# Patient Record
Sex: Male | Born: 1945 | Race: White | Hispanic: No | Marital: Married | Smoking: Former smoker
Health system: Southern US, Community
[De-identification: ages and names within clinical notes are randomized; demographics above are authoritative.]

## PROBLEM LIST (undated history)

## (undated) DIAGNOSIS — R202 Paresthesia of skin: Secondary | ICD-10-CM

## (undated) DIAGNOSIS — K589 Irritable bowel syndrome without diarrhea: Secondary | ICD-10-CM

## (undated) DIAGNOSIS — M79604 Pain in right leg: Secondary | ICD-10-CM

## (undated) DIAGNOSIS — H9190 Unspecified hearing loss, unspecified ear: Secondary | ICD-10-CM

## (undated) DIAGNOSIS — E559 Vitamin D deficiency, unspecified: Secondary | ICD-10-CM

## (undated) DIAGNOSIS — K769 Liver disease, unspecified: Secondary | ICD-10-CM

## (undated) DIAGNOSIS — J019 Acute sinusitis, unspecified: Secondary | ICD-10-CM

## (undated) DIAGNOSIS — D461 Refractory anemia with ring sideroblasts: Secondary | ICD-10-CM

## (undated) DIAGNOSIS — K219 Gastro-esophageal reflux disease without esophagitis: Secondary | ICD-10-CM

## (undated) DIAGNOSIS — C61 Malignant neoplasm of prostate: Secondary | ICD-10-CM

## (undated) DIAGNOSIS — R0602 Shortness of breath: Secondary | ICD-10-CM

## (undated) DIAGNOSIS — R410 Disorientation, unspecified: Secondary | ICD-10-CM

## (undated) DIAGNOSIS — D72829 Elevated white blood cell count, unspecified: Secondary | ICD-10-CM

## (undated) DIAGNOSIS — R079 Chest pain, unspecified: Secondary | ICD-10-CM

## (undated) DIAGNOSIS — R1030 Lower abdominal pain, unspecified: Secondary | ICD-10-CM

## (undated) DIAGNOSIS — R0609 Other forms of dyspnea: Secondary | ICD-10-CM

## (undated) DIAGNOSIS — D469 Myelodysplastic syndrome, unspecified: Secondary | ICD-10-CM

## (undated) DIAGNOSIS — I129 Hypertensive chronic kidney disease with stage 1 through stage 4 chronic kidney disease, or unspecified chronic kidney disease: Secondary | ICD-10-CM

## (undated) DIAGNOSIS — K5792 Diverticulitis of intestine, part unspecified, without perforation or abscess without bleeding: Secondary | ICD-10-CM

## (undated) DIAGNOSIS — E785 Hyperlipidemia, unspecified: Secondary | ICD-10-CM

## (undated) DIAGNOSIS — K429 Umbilical hernia without obstruction or gangrene: Secondary | ICD-10-CM

## (undated) DIAGNOSIS — D239 Other benign neoplasm of skin, unspecified: Secondary | ICD-10-CM

## (undated) DIAGNOSIS — R059 Cough, unspecified: Secondary | ICD-10-CM

## (undated) DIAGNOSIS — R531 Weakness: Secondary | ICD-10-CM

## (undated) DIAGNOSIS — K635 Polyp of colon: Secondary | ICD-10-CM

## (undated) DIAGNOSIS — R6889 Other general symptoms and signs: Secondary | ICD-10-CM

## (undated) DIAGNOSIS — R413 Other amnesia: Principal | ICD-10-CM

## (undated) DIAGNOSIS — N433 Hydrocele, unspecified: Secondary | ICD-10-CM

## (undated) DIAGNOSIS — L03312 Cellulitis of back [any part except buttock]: Secondary | ICD-10-CM

## (undated) DIAGNOSIS — R200 Anesthesia of skin: Secondary | ICD-10-CM

## (undated) DIAGNOSIS — H6123 Impacted cerumen, bilateral: Secondary | ICD-10-CM

## (undated) DIAGNOSIS — I10 Essential (primary) hypertension: Secondary | ICD-10-CM

## (undated) DIAGNOSIS — D649 Anemia, unspecified: Secondary | ICD-10-CM

## (undated) DIAGNOSIS — N183 Chronic kidney disease, stage 3 unspecified: Secondary | ICD-10-CM

## (undated) DIAGNOSIS — N3946 Mixed incontinence: Secondary | ICD-10-CM

## (undated) DIAGNOSIS — R5383 Other fatigue: Secondary | ICD-10-CM

## (undated) DIAGNOSIS — G4700 Insomnia, unspecified: Secondary | ICD-10-CM

## (undated) DIAGNOSIS — R42 Dizziness and giddiness: Secondary | ICD-10-CM

## (undated) DIAGNOSIS — E875 Hyperkalemia: Secondary | ICD-10-CM

## (undated) DIAGNOSIS — D75839 Thrombocytosis, unspecified: Secondary | ICD-10-CM

## (undated) DIAGNOSIS — R112 Nausea with vomiting, unspecified: Secondary | ICD-10-CM

## (undated) DIAGNOSIS — N39490 Overflow incontinence: Secondary | ICD-10-CM

## (undated) DIAGNOSIS — R1011 Right upper quadrant pain: Secondary | ICD-10-CM

## (undated) DIAGNOSIS — K8001 Calculus of gallbladder with acute cholecystitis with obstruction: Secondary | ICD-10-CM

## (undated) DIAGNOSIS — K5790 Diverticulosis of intestine, part unspecified, without perforation or abscess without bleeding: Secondary | ICD-10-CM

## (undated) DIAGNOSIS — E876 Hypokalemia: Secondary | ICD-10-CM

## (undated) HISTORY — DX: Paresthesia of skin: R20.2

## (undated) HISTORY — DX: Lower abdominal pain, unspecified: R10.30

## (undated) HISTORY — DX: Diverticulosis of intestine, part unspecified, without perforation or abscess without bleeding: K57.90

## (undated) HISTORY — DX: Pain in right leg: M79.604

## (undated) HISTORY — DX: Umbilical hernia without obstruction or gangrene: K42.9

## (undated) HISTORY — DX: Hypertensive chronic kidney disease with stage 1 through stage 4 chronic kidney disease, or unspecified chronic kidney disease: I12.9

## (undated) HISTORY — DX: Anesthesia of skin: R20.2

## (undated) HISTORY — DX: Hyperkalemia: E87.5

## (undated) HISTORY — DX: Hyperlipidemia, unspecified: E78.5

## (undated) HISTORY — DX: Other amnesia: R41.3

## (undated) HISTORY — DX: Other general symptoms and signs: R68.89

## (undated) HISTORY — DX: Right upper quadrant pain: R10.11

## (undated) HISTORY — DX: Thrombocytosis, unspecified: D75.839

## (undated) HISTORY — DX: Unspecified hearing loss, unspecified ear: H91.90

## (undated) HISTORY — PX: COLONOSCOPY: SHX174

## (undated) HISTORY — DX: Hypokalemia: E87.6

## (undated) HISTORY — PX: PROSTATECTOMY: SHX69

## (undated) HISTORY — DX: Vitamin D deficiency, unspecified: E55.9

## (undated) HISTORY — DX: Gastro-esophageal reflux disease without esophagitis: K21.9

## (undated) HISTORY — DX: Nausea with vomiting, unspecified: R11.2

## (undated) HISTORY — DX: Elevated white blood cell count, unspecified: D72.829

## (undated) HISTORY — DX: Anesthesia of skin: R20.0

## (undated) HISTORY — DX: Insomnia, unspecified: G47.00

## (undated) HISTORY — DX: Mixed incontinence: N39.46

## (undated) HISTORY — DX: Overflow incontinence: N39.490

## (undated) HISTORY — PX: TONSILLECTOMY: SUR1361

## (undated) HISTORY — DX: Chest pain, unspecified: R07.9

## (undated) HISTORY — DX: Shortness of breath: R06.02

## (undated) HISTORY — DX: Dizziness and giddiness: R42

## (undated) HISTORY — DX: Calculus of gallbladder with acute cholecystitis with obstruction: K80.01

## (undated) HISTORY — DX: Other benign neoplasm of skin, unspecified: D23.9

## (undated) HISTORY — DX: Cough, unspecified: R05.9

## (undated) HISTORY — DX: Irritable bowel syndrome, unspecified: K58.9

## (undated) HISTORY — DX: Other fatigue: R53.83

## (undated) HISTORY — DX: Liver disease, unspecified: K76.9

## (undated) HISTORY — DX: Diverticulitis of intestine, part unspecified, without perforation or abscess without bleeding: K57.92

## (undated) HISTORY — DX: Impacted cerumen, bilateral: H61.23

## (undated) HISTORY — DX: Myelodysplastic syndrome, unspecified: D46.9

## (undated) HISTORY — DX: Chronic kidney disease, stage 3 unspecified: N18.30

## (undated) HISTORY — DX: Malignant neoplasm of prostate: C61

## (undated) HISTORY — DX: Other forms of dyspnea: R06.09

## (undated) HISTORY — DX: Anemia, unspecified: D64.9

## (undated) HISTORY — DX: Cellulitis of back (any part except buttock): L03.312

## (undated) HISTORY — DX: Disorientation, unspecified: R41.0

## (undated) HISTORY — DX: Hydrocele, unspecified: N43.3

## (undated) HISTORY — DX: Essential (primary) hypertension: I10

## (undated) HISTORY — DX: Weakness: R53.1

## (undated) HISTORY — DX: Polyp of colon: K63.5

## (undated) HISTORY — DX: Acute sinusitis, unspecified: J01.90

## (undated) HISTORY — DX: Refractory anemia with ring sideroblasts: D46.1

## (undated) HISTORY — PX: POLYPECTOMY: SHX149

---

## 2001-03-21 ENCOUNTER — Emergency Department (HOSPITAL_COMMUNITY): Admission: EM | Admit: 2001-03-21 | Discharge: 2001-03-21 | Payer: Self-pay | Admitting: Emergency Medicine

## 2001-05-04 ENCOUNTER — Encounter (INDEPENDENT_AMBULATORY_CARE_PROVIDER_SITE_OTHER): Payer: Self-pay | Admitting: Specialist

## 2001-05-04 ENCOUNTER — Ambulatory Visit (HOSPITAL_COMMUNITY): Admission: RE | Admit: 2001-05-04 | Discharge: 2001-05-04 | Payer: Self-pay | Admitting: *Deleted

## 2005-07-05 ENCOUNTER — Ambulatory Visit: Payer: Self-pay | Admitting: Internal Medicine

## 2006-02-27 ENCOUNTER — Ambulatory Visit: Payer: Self-pay | Admitting: Internal Medicine

## 2006-03-10 ENCOUNTER — Ambulatory Visit: Payer: Self-pay | Admitting: Internal Medicine

## 2010-05-04 ENCOUNTER — Ambulatory Visit: Payer: Self-pay | Admitting: Cardiovascular Disease

## 2010-05-13 ENCOUNTER — Ambulatory Visit: Admit: 2010-05-13 | Payer: Self-pay

## 2010-05-18 ENCOUNTER — Telehealth (INDEPENDENT_AMBULATORY_CARE_PROVIDER_SITE_OTHER): Payer: Self-pay | Admitting: *Deleted

## 2010-05-19 ENCOUNTER — Encounter: Payer: Self-pay | Admitting: Cardiology

## 2010-05-19 ENCOUNTER — Ambulatory Visit (HOSPITAL_COMMUNITY): Payer: BC Managed Care – PPO | Attending: Cardiology

## 2010-05-19 DIAGNOSIS — I4949 Other premature depolarization: Secondary | ICD-10-CM

## 2010-05-19 DIAGNOSIS — R0789 Other chest pain: Secondary | ICD-10-CM

## 2010-05-19 DIAGNOSIS — R079 Chest pain, unspecified: Secondary | ICD-10-CM | POA: Insufficient documentation

## 2010-05-19 DIAGNOSIS — R0609 Other forms of dyspnea: Secondary | ICD-10-CM

## 2010-05-26 ENCOUNTER — Encounter (HOSPITAL_COMMUNITY): Payer: Self-pay

## 2010-05-27 NOTE — Progress Notes (Signed)
Summary: nuc pre procedure  Phone Note Outgoing Call Call back at Home Phone [phone]   Call placed by: Cathlyn Parsons RN,  May 18, 2010 2:07 PM Call placed to: Patient Reason for Call: Confirm/change Appt Summary of Call: Reviewed information on [employer] Information Sheet (see scanned document for further details).  Spoke with patient.      Nuclear Med Background Indications for Stress Test: Evaluation for Ischemia   History: Echo, Myocardial Perfusion Study  History Comments: 05 MPS nl 08 Echo EF=nl;mild aortic stenosis  Symptoms: Chest Pain, Chest Tightness, Fatigue with Exertion, SOB  Symptoms Comments: CP radiates to back. R arm numbness   Nuclear Pre-Procedure Cardiac Risk Factors: Family History - CAD, History of Smoking, Lipids

## 2010-05-27 NOTE — Assessment & Plan Note (Signed)
Summary: Cardiology Nuclear Testing  Nuclear Med Background Indications for Stress Test: Evaluation for Ischemia   History: Echo, Myocardial Perfusion Study, MVP  History Comments: '05 ZOX:WRUEAV; '08 Echo:EF=55-60%, mild MR, slight MVP  Symptoms: Chest Pain, Chest Tightness, DOE, Fatigue, Palpitations  Symptoms Comments: CP radiates to back. R arm numbness. Last episode of CP:2 days ago.   Nuclear Pre-Procedure Cardiac Risk Factors: Family History - CAD, History of Smoking, Lipids Caffeine/Decaff Intake: None NPO After: 7:00 PM Lungs: Clear IV 0.9% NS with Angio Cath: 22g     IV Site: R Antecubital IV Started by: Bonnita Levan, RN Chest Size (in) 42     Height (in): 69 Weight (lb): 164 BMI: 24.31  Nuclear Med Study 1 or 2 day study:  1 day     Stress Test Type:  Stress Reading MD:  Marca Ancona, MD     Referring MD:  Kristeen Miss, MD Resting Radionuclide:  Technetium 13m Tetrofosmin     Resting Radionuclide Dose:  11.0 mCi  Stress Radionuclide:  Technetium 57m Tetrofosmin     Stress Radionuclide Dose:  33.0 mCi   Stress Protocol Exercise Time (min):  11:31 min     Max HR:  166 bpm     Predicted Max HR:  156 bpm  Max Systolic BP: 176 mm Hg     Percent Max HR:  106.41 %     METS: 13.4 Rate Pressure Product:  40981    Stress Test Technologist:  [employer], CMA-N     Nuclear Technologist:  Domenic Polite, CNMT  Rest Procedure  Myocardial perfusion imaging was performed at rest 45 minutes following the intravenous administration of Technetium 82m Tetrofosmin.  Stress Procedure  The patient exercised for 11:31 on the treadmill utilizing the Bruce protocol.  The patient stopped due to fatigue and denied any chest pain.  There were no significant ST-T wave changes, only occasional PVC's.  Technetium 66m Tetrofosmin was injected at peak exercise and myocardial perfusion imaging was performed after a brief delay.  QPS Raw Data Images:  Normal; no motion artifact; normal  heart/lung ratio. Stress Images:  Normal homogeneous uptake in all areas of the myocardium. Rest Images:  Normal homogeneous uptake in all areas of the myocardium. Subtraction (SDS):  There is no evidence of scar or ischemia. Transient Ischemic Dilatation:  0.95  (Normal <1.22)  Lung/Heart Ratio:  0.34  (Normal <0.45)  Quantitative Gated Spect Images QGS EDV:  99 ml QGS ESV:  41 ml QGS EF:  59 % QGS cine images:  Normal wall motion.    Overall Impression  Exercise Capacity: Excellent exercise capacity. BP Response: Normal blood pressure response. Clinical Symptoms: No chest pain, stopped due to fatigue.  ECG Impression: Insignificant upsloping ST segment depression. Overall Impression: Normal stress nuclear study.  Appended Document: Cardiology Nuclear Testing COPY SENT TO DR. Melburn Popper

## 2010-06-24 ENCOUNTER — Ambulatory Visit: Payer: Self-pay | Admitting: Cardiovascular Disease

## 2010-09-08 ENCOUNTER — Encounter: Payer: Self-pay | Admitting: Internal Medicine

## 2010-09-08 ENCOUNTER — Ambulatory Visit (AMBULATORY_SURGERY_CENTER): Payer: BC Managed Care – PPO | Admitting: *Deleted

## 2010-09-08 VITALS — Ht 68.0 in | Wt 161.4 lb

## 2010-09-08 DIAGNOSIS — Z1211 Encounter for screening for malignant neoplasm of colon: Secondary | ICD-10-CM

## 2010-09-08 MED ORDER — PEG-KCL-NACL-NASULF-NA ASC-C 100 G PO SOLR: ORAL | Status: DC

## 2010-09-21 ENCOUNTER — Other Ambulatory Visit: Payer: BC Managed Care – PPO | Admitting: Internal Medicine

## 2011-02-18 ENCOUNTER — Encounter: Payer: Self-pay | Admitting: Internal Medicine

## 2011-04-21 ENCOUNTER — Encounter: Payer: Self-pay | Admitting: Internal Medicine

## 2011-05-18 ENCOUNTER — Ambulatory Visit (INDEPENDENT_AMBULATORY_CARE_PROVIDER_SITE_OTHER): Payer: Medicare Other | Admitting: Internal Medicine

## 2011-05-18 ENCOUNTER — Encounter: Payer: Self-pay | Admitting: Internal Medicine

## 2011-05-18 VITALS — BP 130/78 | HR 80 | Ht 68.5 in | Wt 165.0 lb

## 2011-05-18 DIAGNOSIS — Z860100 Personal history of colon polyps, unspecified: Secondary | ICD-10-CM

## 2011-05-18 DIAGNOSIS — R1084 Generalized abdominal pain: Secondary | ICD-10-CM

## 2011-05-18 DIAGNOSIS — K219 Gastro-esophageal reflux disease without esophagitis: Secondary | ICD-10-CM

## 2011-05-18 DIAGNOSIS — K5730 Diverticulosis of large intestine without perforation or abscess without bleeding: Secondary | ICD-10-CM

## 2011-05-18 NOTE — Progress Notes (Signed)
HISTORY OF PRESENT ILLNESS:  Roy Johnson is a 66 y.o. male with a history of prostate cancer, diverticulitis, irritable bowel syndrome, colon polyps. He presents today regarding lower abdominal discomfort and surveillance colonoscopy. Index colonoscopy in 2003 elsewhere with necrotic tissue in the left colon. Colonoscopy here in November of 2007 with diminutive colon polyp removed, without pathology. Appeared adenomatous. Also severe pandiverticulosis. Followup in 5 years recommended. The patient recent saw his primary provider (encounter and outside laboratories reviewed). He was found to have mild anemia with a hemoglobin of 11.2. No evidence for iron deficiency. She does report chronic problems with lower abdominal discomfort. He describes as a gripping discomfort which can occur daily. Occasionally positional. Is not clear to him that meals or bowel movements affect the pain. Occasionally more significant flares of the deals with in time. He was treated on one occasion with antibiotics for presumed diverticulitis. He denies fevers with his discomfort. No problems today. Other GI complaints including excessive belching and occasional heartburn. His last upper endoscopy in November of 2007 was normal.  REVIEW OF SYSTEMS:  All non-GI ROS reviewed and reported to be entirely negative  Past Medical History  Diagnosis Date  . Hyperlipidemia   . Anemia   . Prostate cancer                 . Colon polyp   . Diverticulosis   . GERD (gastroesophageal reflux disease)   . Inguinal hernia   . Umbilical hernia     Past Surgical History  Procedure Date  . Prostatectomy   . Tonsillectomy     Social History Roy Johnson  reports that he quit smoking about 37 years ago. He has never used smokeless tobacco. He reports that he drinks about 3.5 ounces of alcohol per week. He reports that he does not use illicit drugs.  family history includes Breast cancer in his mother; Colon polyps in his father;  Heart disease in his father; Lung cancer in his father; Osteoarthritis in his mother; and Prostate cancer in his father.  No Known Allergies     PHYSICAL EXAMINATION: Vital signs: BP 130/78  Pulse 80  Ht 5' 8.5" (1.74 m)  Wt 165 lb (74.844 kg)  BMI 24.72 kg/m2  Constitutional: generally well-appearing, no acute distress Psychiatric: alert and oriented x3, cooperative Eyes: extraocular movements intact, anicteric, conjunctiva pink Mouth: oral pharynx moist, no lesions Neck: supple no lymphadenopathy Cardiovascular: heart regular rate and rhythm, no murmur Lungs: clear to auscultation bilaterally Abdomen: soft, nontender, nondistended, no obvious ascites, no peritoneal signs, normal bowel sounds, no organomegaly Rectal:deferred until colonoscopy Extremities: no lower extremity edema bilaterally Skin: no lesions on visible extremities Neuro: No focal deficits.  ASSESSMENT:  #1. Chronic lower abdominal discomfort consistent with irritable bowel diverticular spasm #2. History of diverticulitis #3. History of colon polyps. Due for followup #4. History of GERD. Occasional symptoms. Negative index endoscopy 2007   PLAN:  #1. Reflux precautions #2. Discussion on diverticular disease. Contact her primary provider or this office for persisting pain, as this may represent a diverticulitis flare #3. Schedule surveillance colonoscopy.The nature of the procedure, as well as the risks, benefits, and alternatives were carefully and thoroughly reviewed with the patient. Ample time for discussion and questions allowed. The patient understood, was satisfied, and agreed to proceed. The patient wishes to call back and schedule his examination next few months. It should be noted that the patient had concerns regarding his sedation with his last colonoscopy. He felt  it was very difficult to wake him after the procedure. We discussed modern sedation with propofol as an option. He is interested in  this.Marland Kitchen

## 2011-05-18 NOTE — Patient Instructions (Signed)
Please call back to schedule your Colonoscopy after checking your schedule and ask to speak with Verlon Au.  [phone]. You will also need to have a free Previsit prior to your procedure for your instructions.  Verlon Au can set that up for you when you schedule your procedure.

## 2011-05-27 ENCOUNTER — Encounter: Payer: Self-pay | Admitting: Internal Medicine

## 2011-06-03 ENCOUNTER — Other Ambulatory Visit: Payer: Self-pay | Admitting: Internal Medicine

## 2011-06-03 DIAGNOSIS — R1030 Lower abdominal pain, unspecified: Secondary | ICD-10-CM

## 2011-06-06 ENCOUNTER — Ambulatory Visit
Admission: RE | Admit: 2011-06-06 | Discharge: 2011-06-06 | Disposition: A | Discharge status: Home / Self Care | Payer: Medicare Other | Source: Ambulatory Visit | Attending: Internal Medicine | Admitting: Internal Medicine

## 2011-06-06 DIAGNOSIS — R1030 Lower abdominal pain, unspecified: Secondary | ICD-10-CM

## 2011-06-06 MED ORDER — IOHEXOL 300 MG/ML  SOLN
100.0000 mL | Freq: Once | INTRAMUSCULAR | Status: AC | PRN
  Administered 2011-06-06: 100 mL via INTRAVENOUS

## 2011-07-07 ENCOUNTER — Encounter: Payer: Self-pay | Admitting: Internal Medicine

## 2011-07-07 ENCOUNTER — Ambulatory Visit (AMBULATORY_SURGERY_CENTER): Payer: Medicare Other | Admitting: *Deleted

## 2011-07-07 VITALS — Ht 69.0 in | Wt 162.4 lb

## 2011-07-07 DIAGNOSIS — Z86010 Personal history of colonic polyps: Secondary | ICD-10-CM

## 2011-07-07 DIAGNOSIS — K5730 Diverticulosis of large intestine without perforation or abscess without bleeding: Secondary | ICD-10-CM

## 2011-07-07 DIAGNOSIS — R1084 Generalized abdominal pain: Secondary | ICD-10-CM

## 2011-07-07 MED ORDER — PEG-KCL-NACL-NASULF-NA ASC-C 100 G PO SOLR: ORAL | Status: DC

## 2011-07-20 ENCOUNTER — Encounter: Payer: Self-pay | Admitting: Internal Medicine

## 2011-07-20 ENCOUNTER — Ambulatory Visit (AMBULATORY_SURGERY_CENTER): Payer: Medicare Other | Admitting: Internal Medicine

## 2011-07-20 VITALS — BP 136/88 | HR 66 | Temp 96.6°F | Resp 14 | Ht 69.0 in | Wt 162.0 lb

## 2011-07-20 DIAGNOSIS — Z1211 Encounter for screening for malignant neoplasm of colon: Secondary | ICD-10-CM

## 2011-07-20 DIAGNOSIS — R1011 Right upper quadrant pain: Secondary | ICD-10-CM

## 2011-07-20 DIAGNOSIS — K5730 Diverticulosis of large intestine without perforation or abscess without bleeding: Secondary | ICD-10-CM

## 2011-07-20 DIAGNOSIS — Z86010 Personal history of colonic polyps: Secondary | ICD-10-CM

## 2011-07-20 MED ORDER — SODIUM CHLORIDE 0.9 % IV SOLN: 500.0000 mL | INTRAVENOUS | Status: DC

## 2011-07-20 NOTE — Op Note (Signed)
[facility] [address] [place], [place]  [address]  COLONOSCOPY PROCEDURE REPORT  PATIENT:  Roy Johnson, Roy Johnson  MR#:  [id number] BIRTHDATE:  Feb 27, 1946, 65 yrs. old  GENDER:  male ENDOSCOPIST:  Wilhemina Bonito. Eda Keys, MD REF. BY:  Surveillance Program Recall, PROCEDURE DATE:  07/20/2011 PROCEDURE:  Surveillance Colonoscopy ASA CLASS:  Class II INDICATIONS:  history of polyps, surveillance and high-risk screening ; index exam 2003 ([facility]) w/ NAP and f/u 11-07 w/ diminutive polyp (no path) and diverticulosis MEDICATIONS:   MAC sedation, administered by CRNA, propofol (Diprivan) 300 mg IV  DESCRIPTION OF PROCEDURE:   After the risks benefits and alternatives of the procedure were thoroughly explained, informed consent was obtained.  Digital rectal exam was performed and revealed no abnormalities.   The LB 180AL [id number] endoscope was introduced through the anus and advanced to the cecum, which was identified by both the appendix and ileocecal valve, without limitations.  The quality of the prep was excellent, using MoviPrep.  The instrument was then slowly withdrawn as the colon was fully examined. <<PROCEDUREIMAGES>>  FINDINGS:  Severe diverticulosis was found in the ascending , descending, and sigmoid colon.  Otherwise normal colonoscopy without  polyps, masses, vascular ectasias, or inflammatory changes.   Retroflexed views in the rectum revealed no abnormalities.    The time to cecum = 3:52  minutes. The scope was then withdrawn in  11:13  minutes from the cecum and the procedure completed.  COMPLICATIONS:  None  ENDOSCOPIC IMPRESSION: 1) Severe diverticulosis in the ascending and left colon 2) Otherwise normal colonoscopy  RECOMMENDATIONS: 1) Continue current colorectal screening recommendations with a repeat colonoscopy in 10 years.  ______________________________ Wilhemina Bonito. Eda Keys, MD  CC:  Jarome Matin, MD; The Patient  n. eSIGNED:   Wilhemina Bonito. Eda Keys at  07/20/2011 12:45 PM  [place] [facility], [id number]

## 2011-07-20 NOTE — Patient Instructions (Signed)
YOU HAD AN ENDOSCOPIC PROCEDURE TODAY AT THE [facility]: Refer to the procedure report that was given to you for any specific questions about what was found during the examination.  If the procedure report does not answer your questions, please call your gastroenterologist to clarify.  If you requested that your care partner not be given the details of your procedure findings, then the procedure report has been included in a sealed envelope for you to review at your convenience later.  YOU SHOULD EXPECT: Some feelings of bloating in the abdomen. Passage of more gas than usual.  Walking can help get rid of the air that was put into your GI tract during the procedure and reduce the bloating. If you had a lower endoscopy (such as a colonoscopy or flexible sigmoidoscopy) you may notice spotting of blood in your stool or on the toilet paper. If you underwent a bowel prep for your procedure, then you may not have a normal bowel movement for a few days.  DIET: Your first meal following the procedure should be a light meal and then it is ok to progress to your normal diet.  A half-sandwich or bowl of soup is an example of a good first meal.  Heavy or fried foods are harder to digest and may make you feel nauseous or bloated.  Likewise meals heavy in dairy and vegetables can cause extra gas to form and this can also increase the bloating.  Drink plenty of fluids but you should avoid alcoholic beverages for 24 hours.  ACTIVITY: Your care partner should take you home directly after the procedure.  You should plan to take it easy, moving slowly for the rest of the day.  You can resume normal activity the day after the procedure however you should NOT DRIVE or use heavy machinery for 24 hours (because of the sedation medicines used during the test).    SYMPTOMS TO REPORT IMMEDIATELY: A gastroenterologist can be reached at any hour.  During normal business hours, 8:30 AM to 5:00 PM Monday through Friday,  call [phone].  After hours and on weekends, please call the GI answering service at [phone] who will take a message and have the physician on call contact you.   Following lower endoscopy (colonoscopy or flexible sigmoidoscopy):  Excessive amounts of blood in the stool  Significant tenderness or worsening of abdominal pains  Swelling of the abdomen that is new, acute  Fever of 100F or higher  Following upper endoscopy (EGD)  Vomiting of blood or coffee ground material  New chest pain or pain under the shoulder blades  Painful or persistently difficult swallowing  New shortness of breath  Fever of 100F or higher  Black, tarry-looking stools  FOLLOW UP: If any biopsies were taken you will be contacted by phone or by letter within the next 1-3 weeks.  Call your gastroenterologist if you have not heard about the biopsies in 3 weeks.  Our staff will call the home number listed on your records the next business day following your procedure to check on you and address any questions or concerns that you may have at that time regarding the information given to you following your procedure. This is a courtesy call and so if there is no answer at the home number and we have not heard from you through the emergency physician on call, we will assume that you have returned to your regular daily activities without incident.  SIGNATURES/CONFIDENTIALITY: You and/or your care   partner have signed paperwork which will be entered into your electronic medical record.  These signatures attest to the fact that that the information above on your After Visit Summary has been reviewed and is understood.  Full responsibility of the confidentiality of this discharge information lies with you and/or your care-partner.  

## 2011-07-20 NOTE — Progress Notes (Signed)
Patient did not experience any of the following events: a burn prior to discharge; a fall within the facility; wrong site/side/patient/procedure/implant event; or a hospital transfer or hospital admission upon discharge from the facility. (G8907) Patient did not have preoperative order for IV antibiotic SSI prophylaxis. (G8918)  

## 2011-07-21 ENCOUNTER — Telehealth: Payer: Self-pay | Admitting: *Deleted

## 2011-07-21 NOTE — Telephone Encounter (Signed)
  Follow up Call-  Call back number 07/20/2011  Post procedure Call Back phone  # [phone]  Permission to leave phone message Yes     Patient questions:  Do you have a fever, pain , or abdominal swelling? no Pain Score  0 *  Have you tolerated food without any problems? yes  Have you been able to return to your normal activities? yes  Do you have any questions about your discharge instructions: Diet   no Medications  no Follow up visit  no  Do you have questions or concerns about your Care? no  Actions: * If pain score is 4 or above: No action needed, pain <4.

## 2012-01-19 ENCOUNTER — Encounter: Payer: Self-pay | Admitting: Cardiovascular Disease

## 2012-08-07 ENCOUNTER — Encounter: Payer: Self-pay | Admitting: Cardiovascular Disease

## 2013-01-25 ENCOUNTER — Telehealth: Payer: Self-pay | Admitting: Internal Medicine

## 2013-01-25 NOTE — Telephone Encounter (Signed)
Received call from referring office and gve np appt 11/05 @ 11 w/Dr. Arbutus Ped Referring Dr. Jarome Matin Dx- Thrombocytopenia Welcome packet mailed.

## 2013-01-25 NOTE — Telephone Encounter (Signed)
C/D 01/25/13 for appt. 02/13/13

## 2013-01-25 NOTE — Telephone Encounter (Signed)
emailed Roy Johnson to to call pt to r/s appt.

## 2013-02-01 ENCOUNTER — Telehealth: Payer: Self-pay | Admitting: Internal Medicine

## 2013-02-01 NOTE — Telephone Encounter (Signed)
Left message for patient to call back  

## 2013-02-05 NOTE — Telephone Encounter (Signed)
Left message for pt to call back  °

## 2013-02-06 ENCOUNTER — Other Ambulatory Visit: Payer: Self-pay | Admitting: *Deleted

## 2013-02-06 ENCOUNTER — Encounter: Payer: Self-pay | Admitting: Physician Assistant

## 2013-02-06 ENCOUNTER — Ambulatory Visit (INDEPENDENT_AMBULATORY_CARE_PROVIDER_SITE_OTHER): Payer: Medicare Other | Admitting: Physician Assistant

## 2013-02-06 ENCOUNTER — Telehealth: Payer: Self-pay | Admitting: Internal Medicine

## 2013-02-06 VITALS — BP 130/70 | HR 68 | Ht 67.32 in | Wt 159.1 lb

## 2013-02-06 DIAGNOSIS — K5732 Diverticulitis of large intestine without perforation or abscess without bleeding: Secondary | ICD-10-CM

## 2013-02-06 DIAGNOSIS — Z8546 Personal history of malignant neoplasm of prostate: Secondary | ICD-10-CM | POA: Insufficient documentation

## 2013-02-06 DIAGNOSIS — K5730 Diverticulosis of large intestine without perforation or abscess without bleeding: Secondary | ICD-10-CM

## 2013-02-06 DIAGNOSIS — Z86010 Personal history of colonic polyps: Secondary | ICD-10-CM

## 2013-02-06 DIAGNOSIS — Z860101 Personal history of adenomatous and serrated colon polyps: Secondary | ICD-10-CM | POA: Insufficient documentation

## 2013-02-06 DIAGNOSIS — Z8719 Personal history of other diseases of the digestive system: Secondary | ICD-10-CM

## 2013-02-06 DIAGNOSIS — K219 Gastro-esophageal reflux disease without esophagitis: Secondary | ICD-10-CM | POA: Insufficient documentation

## 2013-02-06 MED ORDER — AMOXICILLIN-POT CLAVULANATE 875-125 MG PO TABS: 1.0000 | ORAL_TABLET | Freq: Two times a day (BID) | ORAL | Status: DC

## 2013-02-06 NOTE — Patient Instructions (Addendum)
Stop the Cipro and Flagyl.  We sent a prescription for Augmentin 875 mg, take 1 tab twice daily for 10 days.  Take Align probiotc for 2-3 more weeks, coupon provided.  If feeling a lot better you can cancel the appointment for Monday 02-11-2013 at 3:15 PM.  If not then you can keep the appointment.

## 2013-02-06 NOTE — Progress Notes (Signed)
Agree with initial assessment and plans as outlined 

## 2013-02-06 NOTE — Progress Notes (Signed)
Subjective:    Patient ID: Roy Johnson, male    DOB: 04-27-1945, 67 y.o.   MRN: [id number]  HPI  Roy Johnson is a pleasant 67 year old white male known to Dr. Wendie Agreste who has history of extensive diverticular disease and previous episodes of diverticulitis. He also has history of adenomatous colon polyps IBS, prostate cancer and GERD. Patient last had colonoscopy in April of 2013 and was noted to have severe diverticulosis of the right and left colon no recurrent polyps. Patient says he has had 2 or 3 episodes of diverticulitis previously but has not had any problems for the past couple of years. He had onset of his current symptoms about 3 weeks ago with suprapubic and left lower quadrant abdominal pain. He was seen at Dr. Marja Kays office and started on a course of Cipro and Flagyl which he took for 10 days. He says his symptoms did improve however after he finished the antibiotics within about a week his symptoms had recurred. He was seen again and placed back on Cipro and Flagyl which he is now been on for about 5 days. He also was scheduled for CT scan of the abdomen and pelvis which was done at [facility], and showed extensive colonic diverticulosis with a short segment of wall thickening in the sigmoid colon associated with surrounding edema suggesting mild diverticulitis was also a moderate size left hydrocele and a small hiatal hernia. He has had an elevated white count noted on 02/01/2013 with WBC of 16.3 hemoglobin 11.4 hematocrit of 34.9 and platelet count of 11 and 37. UA was negative. Labs were repeated on 02/04/2013 showing a WBC of 15.8 hemoglobin 12 hematocrit 36.9 and platelet count of 1132. He is scheduled to see a hematologist next week. He states that he is still having lower abdominal pain despite being back on antibiotics for 5 days. He says he is not sure that the antibiotics are completely clearing his diverticulitis. Has not had any documented fever or chills but does have  occasional night sweats which he says he has had for some time. His bowel movements have been normal he has not noted any melena or hematochezia. He does feel some pain into the rectal area and testicles as well as his lower back. He has been able to eat without any change in his symptoms postprandially but has been eating  lighter. He has also noticed some nausea without vomiting. He says his energy level has been poor over the past couple of months in general.     Review of Systems  Constitutional: Positive for fatigue.  HENT: Negative.   Eyes: Negative.   Respiratory: Negative.   Cardiovascular: Negative.   Gastrointestinal: Positive for nausea and abdominal pain.  Endocrine: Negative.   Genitourinary: Positive for dysuria.  Musculoskeletal: Positive for back pain.  Allergic/Immunologic: Negative.   Neurological: Negative.   Hematological: Negative.   Psychiatric/Behavioral: Negative.    Outpatient Encounter Prescriptions as of 02/06/2013  Medication Sig Dispense Refill  . Ciprofloxacin (CIPRO PO) Take by mouth.      . MetroNIDAZOLE (FLAGYL PO) Take by mouth.      . [DISCONTINUED] peg 3350 powder (MOVIPREP) SOLR Moviprep as directed  1 kit  0   No facility-administered encounter medications on file as of 02/06/2013.      No Known Allergies Patient Active Problem List   Diagnosis Date Noted  . Diverticulosis of colon 02/06/2013  . Hx of adenomatous colonic polyps 02/06/2013  . History of IBS 02/06/2013  .  H/O prostate cancer 02/06/2013  . GERD (gastroesophageal reflux disease) 02/06/2013   History  Substance Use Topics  . Smoking status: Former Smoker    Quit date: 09/07/1973  . Smokeless tobacco: Never Used  . Alcohol Use: 3.5 oz/week    7 drink(s) per week    family history includes Breast cancer in his mother; Colon polyps in his father; Heart disease in his father; Lung cancer in his father; Osteoarthritis in his mother; Prostate cancer in his father. There is no  history of Colon cancer, Esophageal cancer, Rectal cancer, or Stomach cancer.  Objective:   Physical Exam well-developed white male in no acute distress, pleasant accompanied by his wife blood pressure 130/70 pulse 68 height 5 foot 7 weight 159. HEENT; nontraumatic normocephalic EOMI PERRLA sclera anicteric, Supple; no JVD, Cardiovascular; regular rate and rhythm with S1-S2 no murmur or gallop, Pulmonary; clear bilaterally, Abdomen; soft he is mildly tender in the suprapubic area and very low left lower quadrant there is no guarding or rebound no palpable mass or hepatosplenomegaly bowel sounds are present, Rectal; exam not done, Extremities; no clubbing cyanosis or edema skin warm and dry, Psych; mood and affect normal and appropriate        Assessment & Plan:  # 57  67 year old male with recurrent sigmoid diverticulitis on complicated by CT scan 02/01/2013 but somewhat refractory to Cipro and Flagyl with relapse after a 10 day course. #2 history of adenomatous colon polyps last colonoscopy April 2013 no recurrent polyps #3 history of prostate cancer status post prostatectomy #4 left hydrocele #5 GERD #6 thrombocytosis and leukocytosis-workup in progress- awaiting appointment with hematologist.  Plan; Will switch antibiotics to Augmentin 875 mg by mouth twice daily x10 more days Patient has just started Align and have asked him to continue for 3 weeks Patient has appointment with Dr. Marina Goodell next week-if his symptoms are much improved he may cancel if symptoms are persisting we will keep his appointment on 02/11/2013.

## 2013-02-06 NOTE — Telephone Encounter (Signed)
Pts wife states that he is on his second round of antibiotics and still having pain and not feeling well. Requesting to be seen today. Pt scheduled to see Mike Gip PA today at 2pm. Pt aware of appt.

## 2013-02-06 NOTE — Telephone Encounter (Signed)
Pt wife called requesting an sooner appt stated wife will check with md and gve her a call back [phone].

## 2013-02-11 ENCOUNTER — Ambulatory Visit: Payer: Medicare Other | Admitting: Internal Medicine

## 2013-02-12 ENCOUNTER — Other Ambulatory Visit: Payer: Self-pay | Admitting: Internal Medicine

## 2013-02-12 DIAGNOSIS — D75839 Thrombocytosis, unspecified: Secondary | ICD-10-CM

## 2013-02-12 DIAGNOSIS — D473 Essential (hemorrhagic) thrombocythemia: Secondary | ICD-10-CM

## 2013-02-12 DIAGNOSIS — D72829 Elevated white blood cell count, unspecified: Secondary | ICD-10-CM

## 2013-02-13 ENCOUNTER — Telehealth: Payer: Self-pay | Admitting: Internal Medicine

## 2013-02-13 ENCOUNTER — Other Ambulatory Visit (HOSPITAL_BASED_OUTPATIENT_CLINIC_OR_DEPARTMENT_OTHER): Payer: Medicare Other | Admitting: Lab

## 2013-02-13 ENCOUNTER — Ambulatory Visit (HOSPITAL_BASED_OUTPATIENT_CLINIC_OR_DEPARTMENT_OTHER): Payer: Medicare Other | Admitting: Internal Medicine

## 2013-02-13 ENCOUNTER — Encounter: Payer: Self-pay | Admitting: Internal Medicine

## 2013-02-13 ENCOUNTER — Ambulatory Visit: Payer: Medicare Other

## 2013-02-13 VITALS — BP 146/83 | HR 85 | Temp 98.1°F | Resp 20 | Ht 67.32 in | Wt 156.7 lb

## 2013-02-13 DIAGNOSIS — D473 Essential (hemorrhagic) thrombocythemia: Secondary | ICD-10-CM

## 2013-02-13 DIAGNOSIS — D72829 Elevated white blood cell count, unspecified: Secondary | ICD-10-CM

## 2013-02-13 LAB — IRON AND TIBC CHCC
%SAT: 11 % — ABNORMAL LOW (ref 20–55)
Iron: 25 ug/dL — ABNORMAL LOW (ref 42–163)
UIBC: 195 ug/dL (ref 117–376)

## 2013-02-13 LAB — CBC WITH DIFFERENTIAL/PLATELET
Basophils Absolute: 0.3 10*3/uL — ABNORMAL HIGH (ref 0.0–0.1)
Eosinophils Absolute: 0.3 10*3/uL (ref 0.0–0.5)
HCT: 36.7 % — ABNORMAL LOW (ref 38.4–49.9)
HGB: 11.9 g/dL — ABNORMAL LOW (ref 13.0–17.1)
LYMPH%: 11.4 % — ABNORMAL LOW (ref 14.0–49.0)
MCV: 87.7 fL (ref 79.3–98.0)
MONO#: 1.5 10*3/uL — ABNORMAL HIGH (ref 0.1–0.9)
MONO%: 7.7 % (ref 0.0–14.0)
NEUT#: 14.9 10*3/uL — ABNORMAL HIGH (ref 1.5–6.5)
Platelets: 875 10*3/uL — ABNORMAL HIGH (ref 140–400)
WBC: 19.1 10*3/uL — ABNORMAL HIGH (ref 4.0–10.3)

## 2013-02-13 LAB — COMPREHENSIVE METABOLIC PANEL (CC13)
Albumin: 3.7 g/dL (ref 3.5–5.0)
Alkaline Phosphatase: 55 U/L (ref 40–150)
Anion Gap: 10 mEq/L (ref 3–11)
BUN: 15.6 mg/dL (ref 7.0–26.0)
CO2: 23 mEq/L (ref 22–29)
Glucose: 118 mg/dl (ref 70–140)
Sodium: 140 mEq/L (ref 136–145)
Total Bilirubin: 1.03 mg/dL (ref 0.20–1.20)
Total Protein: 7.5 g/dL (ref 6.4–8.3)

## 2013-02-13 LAB — FERRITIN CHCC: Ferritin: 1252 ng/ml — ABNORMAL HIGH (ref 22–316)

## 2013-02-13 NOTE — Telephone Encounter (Signed)
Gave pt appt for lab and MD for November 2014 °

## 2013-02-13 NOTE — Patient Instructions (Signed)
Followup visit in 2 weeks for evaluation and discussion of the pending lab results. 

## 2013-02-13 NOTE — Progress Notes (Signed)
[facility] [phone]   [phone]  CONSULT NOTE  REFERRING PHYSICIAN: Dr. Ivery Quale  REASON FOR CONSULTATION:  67 years old white male with leukocytosis and thrombocytosis  HPI Roy Johnson is a 67 y.o. male was past medical history significant for multiple medical problems including history of prostate cancer status post resection 10 years ago, GERD, atypical chest pain, vitamin D deficiency, history of colon polyps and diverticulitis, anemia, and hiatal hernia. The patient has been complaining recently with frequent episodes of diverticulitis. He was seen initially by his primary care physician Dr. Jarold Motto and was treated with a course of Cipro and Flagyl with no significant improvement. He had CT scan of the abdomen that showed no significant abnormality except for diverticulitis. He was referred to Dr. Marina Goodell with [facility] gastroenterology. He was treated with another course of antibiotics in the from of Augmentin with some improvement. During his evaluation CBC was performed on 01/07/2013 and it showed elevated white blood count of 13.3 with normal hemoglobin of 12.3 and hematocrit 38.1% platelets count were elevated at 1,075,000. Repeat CBC on 01/24/2013 showed persistent elevation of the white blood count at 12.8 with decreased hemoglobin to 11.6 and hematocrit 36.5 and platelets count 1,067,000. [facility] CBC on 04/23/2012 showed normal white blood count of 9.3, low hemoglobin of 11.9, low hematocrit 35.1% and elevated platelets count of 784,000. Because of the persistent leukocytosis and thrombocytosis the patient was referred to me today for further evaluation and recommendation regarding his condition. The patient had several complaints today including persistent fatigue even before his episodes of the diverticulitis. He also has sinus headache and shortness breath with exertion even with the fact that he exercises regularly. He also complained  of indigestion and muscle pain especially at the lower extremities. He has occasional nausea and vomiting mainly from his medication. The patient denied having any significant weight loss but he has periodic night sweats. His family history significant for father who had coag disease and cancer, mother died from old age and had history of breast cancer. The patient is married and has 2 adopted children. He was accompanied by his wife Roy Johnson. He is currently retired and used to work in business. He has few years of smoking in his 71s but quit 40 years ago. He drinks a glass of fine every day and no history of drug abuse. HPI  Past Medical History  Diagnosis Date  . Hyperlipidemia   . Anemia   . Prostate cancer                 . Colon polyp   . Diverticulosis   . GERD (gastroesophageal reflux disease)   . Inguinal hernia   . Umbilical hernia   . Diverticulitis     Past Surgical History  Procedure Laterality Date  . Prostatectomy    . Tonsillectomy    . Colonoscopy    . Polypectomy      Family History  Problem Relation Age of Onset  . Heart disease Father   . Colon polyps Father   . Lung cancer Father     cancerous mole  . Prostate cancer Father   . Osteoarthritis Mother   . Breast cancer Mother   . Colon cancer Neg Hx   . Esophageal cancer Neg Hx   . Rectal cancer Neg Hx   . Stomach cancer Neg Hx     Social History History  Substance Use Topics  . Smoking status: Former Smoker  Quit date: 09/07/1973  . Smokeless tobacco: Never Used  . Alcohol Use: 3.5 oz/week    7 drink(s) per week    No Known Allergies  Current Outpatient Prescriptions  Medication Sig Dispense Refill  . amoxicillin-clavulanate (AUGMENTIN) 875-125 MG per tablet Take 1 tablet by mouth 2 (two) times daily.  20 tablet  0  . Ciprofloxacin (CIPRO PO) Take by mouth.      . MetroNIDAZOLE (FLAGYL PO) Take by mouth.       No current facility-administered medications for this visit.    Review of  Systems  Constitutional: positive for fatigue Eyes: negative Ears, nose, mouth, throat, and face: negative Respiratory: positive for dyspnea on exertion Cardiovascular: negative Gastrointestinal: positive for nausea and reflux symptoms Genitourinary:negative Integument/breast: negative Hematologic/lymphatic: negative Musculoskeletal:positive for myalgias Neurological: negative Behavioral/Psych: negative Endocrine: negative Allergic/Immunologic: negative  Physical Exam  ZOX:WRUEA, healthy, no distress, well nourished and well developed SKIN: skin color, texture, turgor are normal, no rashes or significant lesions HEAD: Normocephalic, No masses, lesions, tenderness or abnormalities EYES: normal, PERRLA EARS: External ears normal, Canals clear OROPHARYNX:no exudate, no erythema and lips, buccal mucosa, and tongue normal  NECK: supple, no adenopathy, no JVD LYMPH:  no palpable lymphadenopathy, no hepatosplenomegaly LUNGS: and palpation, clear to auscultation and percussion HEART: regular rate & rhythm and no murmurs ABDOMEN:abdomen soft, non-tender, normal bowel sounds and no masses or organomegaly BACK: Back symmetric, no curvature. EXTREMITIES:no edema, no skin discoloration  NEURO: alert & oriented x 3 with fluent speech, no focal motor/sensory deficits  PERFORMANCE STATUS: ECOG 1  LABORATORY DATA: Lab Results  Component Value Date   WBC 19.1* 02/13/2013   HGB 11.9* 02/13/2013   HCT 36.7* 02/13/2013   MCV 87.7 02/13/2013   PLT 875* 02/13/2013      Chemistry   No results found for this basename: NA, K, CL, CO2, BUN, CREATININE, GLU   No results found for this basename: CALCIUM, ALKPHOS, AST, ALT, BILITOT       RADIOGRAPHIC STUDIES: No results found.  ASSESSMENT: This is a very pleasant 67 years old white male presented for evaluation of leukocytosis and thrombocytosis questionable for myeloproliferative disorder but reactive etiology cannot be excluded especially with  his persistent diverticulitis.   PLAN: I have a lengthy discussion with the patient and his wife today about his condition. I ordered several studies for evaluation of his leukocytosis and thrombocytosis. His platelets count are low today but still over 800,000. I ordered repeat CBC, comprehensive metabolic panel, LDH, iron study and ferritin, JAK-2 mutation as well as molecular study for BCR/ABL These studies are still pending. I will arrange for the patient will come back for followup visit in 2 weeks for evaluation and discussion of his lab results The patient was advised to call immediately if he has any concerning symptoms in the interval. The patient voices understanding of current disease status and treatment options and is in agreement with the current care plan.  All questions were answered. The patient knows to call the clinic with any problems, questions or concerns. We can certainly see the patient much sooner if necessary.  Thank you so much for allowing me to participate in the care of Roy Johnson. I will continue to follow up the patient with you and assist in his care.  I spent 40 minutes counseling the patient face to face. The total time spent in the appointment was 60 minutes.  Chala Gul K. 02/13/2013, 12:46 PM

## 2013-02-14 ENCOUNTER — Telehealth: Payer: Self-pay | Admitting: Physician Assistant

## 2013-02-14 NOTE — Telephone Encounter (Signed)
Spoke with patient's wife. She states the patient is on antibiotics. He had been feeling better and cancelled his OV with Dr. Marina Goodell on Monday. On Tuesday, he started feeling the lower abdominal pain again. Denies fever. He did see the hematologist yesterday. He is asking if he needs to continue the antibiotics or come in for OV. Please, advise.(patient's wife understands he should go to ED for fever or increase in pain)

## 2013-02-15 MED ORDER — AMOXICILLIN-POT CLAVULANATE 875-125 MG PO TABS: 1.0000 | ORAL_TABLET | Freq: Two times a day (BID) | ORAL | Status: DC

## 2013-02-15 NOTE — Telephone Encounter (Signed)
Discussed with Rene Kocher- would give another 7 days of Augmentin 875 BID, and continue probitotic. ROV in one week

## 2013-02-15 NOTE — Telephone Encounter (Signed)
Spoke with Mike Gip, PA and patient needs to continue Augmentin 875 mg BID x 7 days, continue Probiotic also. Scheduled OV in one week with Mike Gip, PA - 02/22/13 at 9:00 AM.  Patient's wife aware of recommendations and appointment.

## 2013-02-16 ENCOUNTER — Other Ambulatory Visit: Payer: Self-pay | Admitting: Physician Assistant

## 2013-02-16 NOTE — Telephone Encounter (Signed)
Pt called to state Augmentin refill as discussed in this telephone note was not at the pharmacy He has continued to have abd symptoms as previously described.  No fevers.  No rectal bleeding Has followup with Amy Esterwood next week Given persistent of symptoms, now despite cipro/flagyl, augmentin course and now need for repeat augmentin course, repeat cross-sectional imaging to be considered I contacted [facility] on [address] and authorized a refill of Augmentin 875 mg BID x 10 days.

## 2013-02-17 NOTE — Telephone Encounter (Signed)
Amy, please follow up on this patient. Thanks. jp

## 2013-02-19 NOTE — Telephone Encounter (Signed)
The patient picked up the prescription on 02-16-2013 per the pharmacist at [facility].

## 2013-02-22 ENCOUNTER — Ambulatory Visit (INDEPENDENT_AMBULATORY_CARE_PROVIDER_SITE_OTHER): Payer: Medicare Other | Admitting: Physician Assistant

## 2013-02-22 ENCOUNTER — Encounter: Payer: Self-pay | Admitting: Physician Assistant

## 2013-02-22 ENCOUNTER — Other Ambulatory Visit (INDEPENDENT_AMBULATORY_CARE_PROVIDER_SITE_OTHER): Payer: Medicare Other

## 2013-02-22 VITALS — BP 124/80 | HR 66 | Ht 67.0 in | Wt 160.0 lb

## 2013-02-22 DIAGNOSIS — R300 Dysuria: Secondary | ICD-10-CM

## 2013-02-22 DIAGNOSIS — K5732 Diverticulitis of large intestine without perforation or abscess without bleeding: Secondary | ICD-10-CM

## 2013-02-22 DIAGNOSIS — R1032 Left lower quadrant pain: Secondary | ICD-10-CM

## 2013-02-22 LAB — CBC WITH DIFFERENTIAL/PLATELET
Basophils Relative: 0.8 % (ref 0.0–3.0)
Eosinophils Absolute: 0.4 10*3/uL (ref 0.0–0.7)
HCT: 35.8 % — ABNORMAL LOW (ref 39.0–52.0)
Hemoglobin: 11.3 g/dL — ABNORMAL LOW (ref 13.0–17.0)
Lymphocytes Relative: 14.9 % (ref 12.0–46.0)
Lymphs Abs: 2.1 10*3/uL (ref 0.7–4.0)
MCHC: 31.6 g/dL (ref 30.0–36.0)
Monocytes Absolute: 1 10*3/uL (ref 0.1–1.0)
Monocytes Relative: 6.7 % (ref 3.0–12.0)
Neutro Abs: 10.8 10*3/uL — ABNORMAL HIGH (ref 1.4–7.7)
RBC: 4.08 Mil/uL — ABNORMAL LOW (ref 4.22–5.81)

## 2013-02-22 LAB — URINALYSIS
Bilirubin Urine: NEGATIVE
Ketones, ur: NEGATIVE
Leukocytes, UA: NEGATIVE
Nitrite: NEGATIVE
Specific Gravity, Urine: 1.025 (ref 1.000–1.030)
Total Protein, Urine: NEGATIVE
Urine Glucose: NEGATIVE
Urobilinogen, UA: 0.2 (ref 0.0–1.0)
pH: 6 (ref 5.0–8.0)

## 2013-02-22 MED ORDER — AMOXICILLIN-POT CLAVULANATE 875-125 MG PO TABS: 1.0000 | ORAL_TABLET | Freq: Two times a day (BID) | ORAL | Status: AC

## 2013-02-22 NOTE — Patient Instructions (Signed)
Please go to the basement level to have your labs drawn.  We did call in the 4 more days of Augmentin 875/125/mg, to [address] today.   You have been scheduled for a CT scan of the abdomen and pelvis at [facility] ([address]---this is in the same building as Architectural technologist).   You are scheduled on Monday 02-25-2013 at 8:30. You should arrive at 8:15 am prior to your appointment time for registration. Please follow the written instructions below on the day of your exam:  WARNING: IF YOU ARE ALLERGIC TO IODINE/X-RAY DYE, PLEASE NOTIFY RADIOLOGY IMMEDIATELY AT [phone]! YOU WILL BE GIVEN A 13 HOUR PREMEDICATION PREP.  1) Do not eat or drink anything after 4:30 am  (4 hours prior to your test) 2) You have been given 2 bottles of oral contrast to drink. The solution may taste better if refrigerated, but do NOT add ice or any other liquid to this solution. Shake well before drinking.    Drink 1 bottle of contrast @ 6:30 am  (2 hours prior to your exam)  Drink 1 bottle of contrast @ 7:30 am  (1 hour prior to your exam)  You may take any medications as prescribed with a small amount of water except for the following: Metformin, Glucophage, Glucovance, Avandamet, Riomet, Fortamet, Actoplus Met, Janumet, Glumetza or Metaglip. The above medications must be held the day of the exam AND 48 hours after the exam.  The purpose of you drinking the oral contrast is to aid in the visualization of your intestinal tract. The contrast solution may cause some diarrhea. Before your exam is started, you will be given a small amount of fluid to drink. Depending on your individual set of symptoms, you may also receive an intravenous injection of x-ray contrast/dye. Plan on being at [facility] for 30 minutes or long, depending on the type of exam you are having performed.  If you have any questions regarding your exam or if you need to reschedule, you may call the CT  department at [phone] between the hours of 8:00 am and 5:00 pm, Monday-Friday.  ________________________________________________________________________

## 2013-02-22 NOTE — Progress Notes (Signed)
Subjective:    Patient ID: Roy Johnson, male    DOB: Aug 15, 1945, 67 y.o.   MRN: [id number]  HPI  [name]  is a very nice 67 year old male known to Dr. Marina Goodell with history of extensive diverticular disease and recurrent diverticulitis. He also has history of adenomatous colon polyps, IBS prostate cancer and GERD. Last colonoscopy was done in April of 2013 with finding of severe diverticulosis of the right and left colon no recurrent polyps. Patient was seen here on 02/06/2013 after he had been on a course of Cipro and Flagyl for 10 days per his primary care physician for an episode of recurrent diverticulitis. He says initially he improved but then within about a week his symptoms recurred he was placed back on Cipro and Flagyl and had not noticed any improvement. CT of the abdomen and pelvis [facility] October of 2014 showed extensive diverticulosis with a short segment of wall thickening in the sigmoid colon with surrounding edema suggesting mild diverticulitis was also noted a moderate left hydrocele. At that time he was switched to Augmentin 875 mg by mouth twice daily for 10 more days and asked for followup in the office. He was also continued on a probiotic. Patient has also been undergoing workup for a persistent thrombocytosis and leukocytosis and had his first appointment with Dr. Shirline Frees last week. A myeloproliferative disorder is being considered however with the diverticulitis Dr. Shirline Frees did not 12 rule out leukocytosis secondary to underlying infection. His last CBC on 02/13/2013 showed a WBC of 19.1, hemoglobin 11.9 hematocrit of 36.7 and platelet count of 875. Dr. Shirline Frees also checked a ferritin level which is elevated at 1252, severe iron of 25 TIBC 220 and iron saturation of 11. Patient comes back in today still on Augmentin and states that he really does not feel any better. He certainly does not feel any worse but continues to have tenderness and discomfort in the left  lower quadrant. He reports she's been somewhat constipated despite eating normally. He has not had any fever chills or sweats. He does feel that his pain is somewhat positional and that he can exacerbate the pain by stretching usually. He is also complaining of some pain with urination into the urethra and into the anus. He says once his bladder is empty this seems to subside.     Review of Systems  Constitutional: Positive for fatigue.  HENT: Negative.   Eyes: Negative.   Respiratory: Negative.   Cardiovascular: Negative.   Gastrointestinal: Positive for abdominal pain.  Genitourinary: Positive for dysuria and penile pain.  Musculoskeletal: Negative.   Allergic/Immunologic: Negative.   Neurological: Negative.   Hematological: Negative.   Psychiatric/Behavioral: Negative.    Outpatient Prescriptions Prior to Visit  Medication Sig Dispense Refill  . amoxicillin-clavulanate (AUGMENTIN) 875-125 MG per tablet take 1 tablet by mouth twice a day for 10 days  20 tablet  0  . Ciprofloxacin (CIPRO PO) Take by mouth.      . MetroNIDAZOLE (FLAGYL PO) Take by mouth.       No facility-administered medications prior to visit.      No Known Allergies Patient Active Problem List   Diagnosis Date Noted  . Thrombocytosis 02/13/2013  . Leukocytosis, unspecified 02/13/2013  . Diverticulosis of colon 02/06/2013  . Hx of adenomatous colonic polyps 02/06/2013  . History of IBS 02/06/2013  . H/O prostate cancer 02/06/2013  . GERD (gastroesophageal reflux disease) 02/06/2013   History  Substance Use Topics  . Smoking  status: Former Smoker    Quit date: 09/07/1973  . Smokeless tobacco: Never Used  . Alcohol Use: 3.5 oz/week    7 drink(s) per week   family history includes Breast cancer in his mother; Colon polyps in his father; Heart disease in his father; Lung cancer in his father; Osteoarthritis in his mother; Prostate cancer in his father. There is no history of Colon cancer, Esophageal  cancer, Rectal cancer, or Stomach cancer.  Objective:   Physical Exam and acute distress his wife blood pressure 124/80 pulse 66 height 5 foot 7 weight 160. HEENT; nontraumatic normocephalic EOMI PERRLA sclera anicteric, Supple; no JVD, Cardiovascular; regular rate and rhythm with S1-S2 no murmur or gallop, Pulmonary; clear bilaterally, Abdomen ;soft he is tender in the left lower quadrant and suprapubic area there is no guarding or rebound no palpable mass or hepatosplenomegaly bowel sounds are active, Rectal; exam not done, Extremities; no clubbing cyanosis or edema skin warm and dry, Psych; mood and affect normal and appropriate        Assessment & Plan:  # 57  67 year old male with known extensive diverticulosis and recurrent diverticulitis now with a prolonged episode of diverticulitis which was mild by a CT done about 3 weeks ago. Patient has persistent symptoms but despite several weeks of antibiotics and is now complaining of some urinary symptoms as well. Rule out refractory diverticulitis, rule out common current inflammatory changes of the bladder and/or adhesions contributing to  urinary symptoms  #2 persistent leukocytosis-suspect underlying myelo proliferative disorder #3 persistent thrombocytosis-suspect underlying myeloproliferative disorder workup in progress #4 mild anemia. #5 fatigue #6 history of prostate cancer status post prostatectomy #7 history of adenomatous colon polyps last colonoscopy April 2013  Plan; repeat CBC with differential today, check UA Continue Augmentin 875 twice daily x5 more days Schedule for CT scan of the abdomen and pelvis-patient may need repeat colonoscopy and/or urology consultation depending on CT findings. He will need office followup with Dr. Marina Goodell.

## 2013-02-25 ENCOUNTER — Ambulatory Visit (INDEPENDENT_AMBULATORY_CARE_PROVIDER_SITE_OTHER)
Admission: RE | Admit: 2013-02-25 | Discharge: 2013-02-25 | Disposition: A | Discharge status: Home / Self Care | Payer: Medicare Other | Source: Ambulatory Visit | Attending: Physician Assistant | Admitting: Physician Assistant

## 2013-02-25 DIAGNOSIS — K5732 Diverticulitis of large intestine without perforation or abscess without bleeding: Secondary | ICD-10-CM

## 2013-02-25 DIAGNOSIS — R300 Dysuria: Secondary | ICD-10-CM

## 2013-02-25 DIAGNOSIS — R1032 Left lower quadrant pain: Secondary | ICD-10-CM

## 2013-02-25 MED ORDER — IOHEXOL 300 MG/ML  SOLN
80.0000 mL | Freq: Once | INTRAMUSCULAR | Status: AC | PRN
  Administered 2013-02-25: 80 mL via INTRAVENOUS

## 2013-02-26 NOTE — Progress Notes (Signed)
Agree with excellent assessment and CT given urinary symptoms. Not sure colonoscopy would be helpful. Agree with follow up plans as well

## 2013-02-27 ENCOUNTER — Ambulatory Visit: Payer: Medicare Other | Admitting: Internal Medicine

## 2013-02-27 ENCOUNTER — Telehealth: Payer: Self-pay | Admitting: Physician Assistant

## 2013-02-27 NOTE — Telephone Encounter (Signed)
Pt aware of CT results per Dr. Marina Goodell. Pt scheduled per Dr. Marina Goodell to see him 03/11/13@8 :30am. Pt aware of appt.

## 2013-02-28 ENCOUNTER — Encounter: Payer: Self-pay | Admitting: Physician Assistant

## 2013-02-28 ENCOUNTER — Other Ambulatory Visit (HOSPITAL_BASED_OUTPATIENT_CLINIC_OR_DEPARTMENT_OTHER): Payer: Medicare Other | Admitting: Lab

## 2013-02-28 ENCOUNTER — Ambulatory Visit (HOSPITAL_BASED_OUTPATIENT_CLINIC_OR_DEPARTMENT_OTHER): Payer: Medicare Other | Admitting: Physician Assistant

## 2013-02-28 ENCOUNTER — Telehealth: Payer: Self-pay | Admitting: Internal Medicine

## 2013-02-28 ENCOUNTER — Other Ambulatory Visit: Payer: Medicare Other | Admitting: Lab

## 2013-02-28 VITALS — BP 137/79 | HR 63 | Temp 97.0°F | Resp 18 | Ht 67.0 in | Wt 158.9 lb

## 2013-02-28 DIAGNOSIS — D75839 Thrombocytosis, unspecified: Secondary | ICD-10-CM

## 2013-02-28 DIAGNOSIS — D72829 Elevated white blood cell count, unspecified: Secondary | ICD-10-CM

## 2013-02-28 DIAGNOSIS — D473 Essential (hemorrhagic) thrombocythemia: Secondary | ICD-10-CM

## 2013-02-28 LAB — CBC WITH DIFFERENTIAL/PLATELET
Basophils Absolute: 0.1 10*3/uL (ref 0.0–0.1)
EOS%: 3.5 % (ref 0.0–7.0)
HCT: 35.2 % — ABNORMAL LOW (ref 38.4–49.9)
HGB: 11.2 g/dL — ABNORMAL LOW (ref 13.0–17.1)
LYMPH%: 22.6 % (ref 14.0–49.0)
MCH: 27.9 pg (ref 27.2–33.4)
MCV: 87.7 fL (ref 79.3–98.0)
MONO#: 0.9 10*3/uL (ref 0.1–0.9)
MONO%: 7.4 % (ref 0.0–14.0)
NEUT%: 65.6 % (ref 39.0–75.0)
Platelets: 914 10*3/uL — ABNORMAL HIGH (ref 140–400)
RBC: 4.01 10*6/uL — ABNORMAL LOW (ref 4.20–5.82)
WBC: 11.6 10*3/uL — ABNORMAL HIGH (ref 4.0–10.3)

## 2013-02-28 MED ORDER — HYDROXYUREA 500 MG PO CAPS: 500.0000 mg | ORAL_CAPSULE | Freq: Every day | ORAL | Status: DC

## 2013-02-28 NOTE — Telephone Encounter (Signed)
Gave pt appt for labs and MD for january 2015

## 2013-02-28 NOTE — Patient Instructions (Signed)
Start taking an over-the-counter folic acid tablet once daily Begin Hydrea (hydroxyurea) 500 mg by mouth daily Keep your scheduled weekly lab appointments Follow with Dr. Arbutus Ped in one month

## 2013-02-28 NOTE — Progress Notes (Addendum)
No images are attached to the encounter. No scans are attached to the encounter. No scans are attached to the encounter. [facility] SHARED VISIT PROGRESS NOTE  Garlan Fillers, MD [address] [facility], [place]. [place] [address]  DIAGNOSIS: 1.  Essential thrombocythemia with positive JAK2 mutation 2. Leukocytosis  PRIOR THERAPY: None  CURRENT THERAPY: Patient began therapy with Hydrea 500 mg by mouth daily  INTERVAL HISTORY: Roy Johnson 67 y.o. male returns for a scheduled regular office visit for followup of his leukocytosis and thrombocytosis. He had several studies done to complete his workup including iron studies, BCR/able as well as JAK2 mutation. He presents today to discuss results of all the studies and discuss treatment options. He complains of fatigue that has been present for several months. He also reports that to his knowledge she's had a low level anemia for the past 4 years or so. He is currently completing a one-month course of Augmentin for diverticulitis. He is followed by Dr. Marina Goodell at [facility] and he has a followup appointment with Dr. Marina Goodell on 03/11/2013. He also reports a "floating headache". It will occur on various parts of his head not associated with any vision changes, nausea or vomiting. Of note patient states that he is able to do the gym 3 times a week for approximately 2 hours at each visit with some fatigue afterwards.  MEDICAL HISTORY: Past Medical History  Diagnosis Date  . Hyperlipidemia   . Anemia   . Prostate cancer                 . Colon polyp   . Diverticulosis   . GERD (gastroesophageal reflux disease)   . Inguinal hernia   . Umbilical hernia   . Diverticulitis     ALLERGIES:  has No Known Allergies.  MEDICATIONS:  Current Outpatient Prescriptions  Medication Sig Dispense Refill  . amoxicillin-clavulanate (AUGMENTIN) 500-125 MG per tablet Take 1 tablet by mouth 2 (two) times daily.       . hydroxyurea (HYDREA) 500 MG capsule Take 1 capsule (500 mg total) by mouth daily. May take with food to minimize GI side effects.  30 capsule  2   No current facility-administered medications for this visit.    SURGICAL HISTORY:  Past Surgical History  Procedure Laterality Date  . Prostatectomy    . Tonsillectomy    . Colonoscopy    . Polypectomy      REVIEW OF SYSTEMS:  Constitutional: positive for fatigue Eyes: negative Ears, nose, mouth, throat, and face: negative Respiratory: negative Cardiovascular: negative Gastrointestinal: positive for abdominal pain and Left lower quadrant pain and a patient with known diverticulitis currently completing a course of antibiotics Genitourinary:negative Integument/breast: negative Hematologic/lymphatic: negative Musculoskeletal:negative Neurological: negative Behavioral/Psych: negative Endocrine: negative Allergic/Immunologic: negative   PHYSICAL EXAMINATION: General appearance: alert, cooperative, appears stated age and no distress Head: Normocephalic, without obvious abnormality, atraumatic Neck: no adenopathy, no carotid bruit, no JVD, supple, symmetrical, trachea midline and thyroid not enlarged, symmetric, no tenderness/mass/nodules Lymph nodes: Cervical, supraclavicular, and axillary nodes normal. Resp: clear to auscultation bilaterally Back: symmetric, no curvature. ROM normal. No CVA tenderness. Cardio: regular rate and rhythm, S1, S2 normal, no murmur, click, rub or gallop GI: mild left lower quadrant tenderness without rebound or referred pain, remainder the abdomen is soft nontender no appreciable organomegaly, bowel sounds present and normally active in all quadrants Extremities: extremities normal, atraumatic, no cyanosis or edema Neurologic: Alert and oriented X 3, normal  strength and tone. Normal symmetric reflexes. Normal coordination and gait  ECOG PERFORMANCE STATUS: 1 - Symptomatic but completely  ambulatory  Blood pressure 137/79, pulse 63, temperature 97 F (36.1 C), temperature source Oral, resp. rate 18, height 5\' 7"  (1.702 m), weight 158 lb 14.4 oz (72.077 kg).  LABORATORY DATA: Lab Results  Component Value Date   WBC 11.6* 02/28/2013   HGB 11.2* 02/28/2013   HCT 35.2* 02/28/2013   MCV 87.7 02/28/2013   PLT 914* 02/28/2013      Chemistry      Component Value Date/Time   NA 140 02/13/2013 1103   K 4.2 02/13/2013 1103   CO2 23 02/13/2013 1103   BUN 15.6 02/13/2013 1103   CREATININE 0.8 02/13/2013 1103      Component Value Date/Time   CALCIUM 9.6 02/13/2013 1103   ALKPHOS 55 02/13/2013 1103   AST 15 02/13/2013 1103   ALT 13 02/13/2013 1103   BILITOT 1.03 02/13/2013 1103       RADIOGRAPHIC STUDIES:  Ct Abdomen Pelvis W Contrast  02/25/2013   CLINICAL DATA:  Left lower quadrant pain despite antibiotics. History of prostate cancer.  EXAM: CT ABDOMEN AND PELVIS WITH CONTRAST  TECHNIQUE: Multidetector CT imaging of the abdomen and pelvis was performed using the standard protocol following bolus administration of intravenous contrast.  CONTRAST:  80mL OMNIPAQUE IOHEXOL 300 MG/ML  SOLN  COMPARISON:  06/06/2011 and 07/05/2005.  CT.  FINDINGS: Lung bases clear.  Sigmoid diverticula and muscular hypertrophy. In the surrounding fat planes, curvilinear thickening suggestive of result of prior inflammation although changes of subtle acute inflammation not entirely excluded given the patient's history. No evidence of abscess or free intraperitoneal air.  One of the low-density liver lesions located within the dome of the liver has increased slightly in size now measuring 8 mm versus prior 7 mm and is too small to characterize.  Post prostatectomy. No pelvic adenopathy. Non contrast filled views of the urinary bladder unremarkable.  Heart size top-normal.  Atherosclerotic type changes lower abdominal aorta are and iliac arteries without aneurysmal dilation.  No focal splenic, pancreatic,  adrenal or renal lesion.  No calcified gallstones.  Mild degenerative changes lumbar spine.  Minimal sclerosis right ischium unchanged.  IMPRESSION: Sigmoid diverticula and muscular hypertrophy. In the surrounding fat planes, curvilinear thickening suggestive of result of prior inflammation although changes of subtle acute inflammation not entirely excluded given the patient's history. No evidence of abscess or free intraperitoneal air.  One of the low-density liver lesions located within the dome of the liver has increased slightly in size now measuring 8 mm versus prior 7 mm and is too small to characterize.  Post prostatectomy. No pelvic adenopathy.  Atherosclerotic type changes lower abdominal aorta are and iliac arteries without aneurysmal dilation.  Minimal sclerosis right ischium unchanged.  These results will be called to the ordering clinician or representative by the Radiologist Assistant, and communication documented in the PACS Dashboard.   Electronically Signed   By: Bridgett Larsson M.D.   On: 02/25/2013 09:53     ASSESSMENT: Molecular studies are as follows BCR/able was not detected therefore negative for CML. 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 Hemoglobin mildly anemic at 11.2. The patient still has a leukocytosis however the total white count is now 11.6 where previously it was 14.3  and regarding his thrombocytosis it was 1071 now 914. This is consistent with essential thrombocythemia.  PLAN: Patient was discussed with also seen by Dr. Arbutus Ped. Dr. Arbutus Ped had a long discussion with the patient and his wife regarding his laboratory molecular study results, as well as explaining his diagnosis of essential thrombocythemia. The patient will be started on Hydrea at 500 mg by mouth daily and is advised to start taking an  over-the-counter folic acid supplement tablet once daily. We will arrange for the patient to have weekly labs consisting of a CBC differential and C. met. He will followup with Dr. Arbutus Ped in one month for a symptom management visit.  Laural Benes, Fujiko Picazo E, PA-C     All questions were answered. The patient knows to call the clinic with any problems, questions or concerns. We can certainly see the patient much sooner if necessary.  ADDENDUM: Hematology/Oncology Attending: I had a face to face encounter with the patient. I recommended his current plan. This is a very pleasant 67 years old recently diagnosed with essential thrombocythemia with positive JAK-2 mutation. I have a lengthy discussion with the patient today about his condition. I recommended for him to start treatment with Hydrea 500 mg by mouth daily. I discussed with the patient adverse effect of this treatment including but not limited to mild alopecia, nausea and vomiting, myelosuppression as well as liver or renal dysfunction. The patient would like to proceed with treatment as planned. He would come back for follow up visit in one month's for reevaluation and management any adverse effect of his treatment. He was advised to call immediately if he has any concerning symptoms in the interval. Lajuana Matte., MD 03/02/2013

## 2013-03-08 ENCOUNTER — Other Ambulatory Visit (HOSPITAL_BASED_OUTPATIENT_CLINIC_OR_DEPARTMENT_OTHER): Payer: Medicare Other | Admitting: Lab

## 2013-03-08 DIAGNOSIS — D473 Essential (hemorrhagic) thrombocythemia: Secondary | ICD-10-CM

## 2013-03-08 LAB — CBC WITH DIFFERENTIAL/PLATELET
BASO%: 2.6 % — ABNORMAL HIGH (ref 0.0–2.0)
Basophils Absolute: 0.3 10*3/uL — ABNORMAL HIGH (ref 0.0–0.1)
Eosinophils Absolute: 0.3 10*3/uL (ref 0.0–0.5)
HGB: 11 g/dL — ABNORMAL LOW (ref 13.0–17.1)
LYMPH%: 25.9 % (ref 14.0–49.0)
MCH: 28.5 pg (ref 27.2–33.4)
MCHC: 32.1 g/dL (ref 32.0–36.0)
MCV: 88.7 fL (ref 79.3–98.0)
MONO#: 0.8 10*3/uL (ref 0.1–0.9)
MONO%: 7.7 % (ref 0.0–14.0)
NEUT%: 60.8 % (ref 39.0–75.0)
Platelets: 945 10*3/uL — ABNORMAL HIGH (ref 140–400)
RBC: 3.87 10*6/uL — ABNORMAL LOW (ref 4.20–5.82)
WBC: 10.8 10*3/uL — ABNORMAL HIGH (ref 4.0–10.3)

## 2013-03-08 LAB — COMPREHENSIVE METABOLIC PANEL (CC13)
Alkaline Phosphatase: 63 U/L (ref 40–150)
Creatinine: 0.9 mg/dL (ref 0.7–1.3)
Glucose: 138 mg/dl (ref 70–140)
Sodium: 139 mEq/L (ref 136–145)
Total Bilirubin: 0.7 mg/dL (ref 0.20–1.20)
Total Protein: 7.3 g/dL (ref 6.4–8.3)

## 2013-03-11 ENCOUNTER — Encounter: Payer: Self-pay | Admitting: Internal Medicine

## 2013-03-11 ENCOUNTER — Ambulatory Visit (INDEPENDENT_AMBULATORY_CARE_PROVIDER_SITE_OTHER): Payer: Medicare Other | Admitting: Internal Medicine

## 2013-03-11 VITALS — BP 120/70 | HR 75 | Ht 67.25 in | Wt 157.0 lb

## 2013-03-11 DIAGNOSIS — K5732 Diverticulitis of large intestine without perforation or abscess without bleeding: Secondary | ICD-10-CM

## 2013-03-11 DIAGNOSIS — N23 Unspecified renal colic: Secondary | ICD-10-CM

## 2013-03-11 DIAGNOSIS — R1032 Left lower quadrant pain: Secondary | ICD-10-CM

## 2013-03-11 DIAGNOSIS — R309 Painful micturition, unspecified: Secondary | ICD-10-CM

## 2013-03-11 DIAGNOSIS — K5792 Diverticulitis of intestine, part unspecified, without perforation or abscess without bleeding: Secondary | ICD-10-CM

## 2013-03-11 NOTE — Patient Instructions (Addendum)
You have an appointment with Dr. Roselee Nova at [facility] on 03/28/13 at 2:00pm.

## 2013-03-11 NOTE — Progress Notes (Signed)
HISTORY OF PRESENT ILLNESS:  Roy Johnson is a 67 y.o. male with a history of prostate cancer status post prostatectomy, left hydrocele, diverticulosis complicated by diverticulitis, irritable bowel syndrome, and colon polyps. He presents today for followup regarding problems with lower abdominal discomfort treated as diverticulitis. Problems began about one month ago. He was having lower abdominal discomfort as well as discomfort with urination into the urethra and anus. He underwent a CT scan elsewhere in October which questioned mild diverticulitis revealed the left hydrocele. He was treated empirically with 2 antibiotics, but his symptoms did not improve. He was seen here by the advanced practitioner and placed on additional course of antibiotics. CBC was unremarkable. Repeat CT scan was performed. This revealed muscular hypertrophy of the left colon and in area of diverticulosis without obvious diverticulitis or other problems. His last complete colonoscopy was in April 2013. Again severe left-sided and right-sided diverticular disease. Otherwise normal. Currently, he continues with predominant urologic complaints as previous. Bowel habits with hard stool. No bleeding. No new complaints. Lots of questions. All data reviewed with the patient  REVIEW OF SYSTEMS:  All non-GI ROS negative except for pain with urination  Past Medical History  Diagnosis Date  . Hyperlipidemia   . Anemia   . Prostate cancer                 . Colon polyp   . Diverticulosis   . GERD (gastroesophageal reflux disease)   . Inguinal hernia   . Umbilical hernia   . Diverticulitis     Past Surgical History  Procedure Laterality Date  . Prostatectomy    . Tonsillectomy    . Colonoscopy    . Polypectomy      Social History Roy Johnson  reports that he quit smoking about 39 years ago. He has never used smokeless tobacco. He reports that he drinks about 3.5 ounces of alcohol per week. He reports that he does not  use illicit drugs.  family history includes Breast cancer in his mother; Colon polyps in his father; Heart disease in his father; Lung cancer in his father; Osteoarthritis in his mother; Prostate cancer in his father. There is no history of Colon cancer, Esophageal cancer, Rectal cancer, or Stomach cancer.  No Known Allergies     PHYSICAL EXAMINATION: Vital signs: BP 120/70  Pulse 75  Ht 5' 7.25" (1.708 m)  Wt 157 lb (71.215 kg)  BMI 24.41 kg/m2 General: Well-developed, well-nourished, no acute distress HEENT: Sclerae are anicteric, conjunctiva pink. Oral mucosa intact Lungs: Clear Heart: Regular Abdomen: soft, mild tenderness throughout the lower abdomen to palpation, nondistended, no obvious ascites, no peritoneal signs, normal bowel sounds. No organomegaly. Extremities: No edema Psychiatric: alert and oriented x3. Cooperative   ASSESSMENT:   #1. No diverticulosis. Question recent bout of mild diverticulitis. Several courses of antibiotics with improvement in abdominal component. I explained to him the patient's with significant diverticular disease can experience discomfort without actual infection or inflammation. This is mostly on the basis of spasm. In any event, no compelling evidence for diverticulitis at present. #2. History of colon polyps. Last colonoscopy April 2013 negative for neoplasia. #3. Ongoing urologic complaints as described. Patient with a history of prostate cancer status post remote prostatectomy as well as known left hydrocele. He requires about urologic evaluation. Requests to be seen in [facility]   PLAN:  #1. Fiber supplementation with Metamucil 2 tablespoons daily #2. Routine surveillance colonoscopy 2023 #3. Refer to Dr. Barron Alvine for  urologic opinion regarding persistent urologic complaints. #4. Resume general medical care with Dr. Eloise Harman. GI followup as needed

## 2013-03-14 ENCOUNTER — Other Ambulatory Visit (HOSPITAL_BASED_OUTPATIENT_CLINIC_OR_DEPARTMENT_OTHER): Payer: Medicare Other | Admitting: Lab

## 2013-03-14 DIAGNOSIS — D473 Essential (hemorrhagic) thrombocythemia: Secondary | ICD-10-CM

## 2013-03-14 LAB — COMPREHENSIVE METABOLIC PANEL (CC13)
ALT: 16 U/L (ref 0–55)
AST: 17 U/L (ref 5–34)
Albumin: 4.1 g/dL (ref 3.5–5.0)
Alkaline Phosphatase: 60 U/L (ref 40–150)
Chloride: 107 mEq/L (ref 98–109)
Creatinine: 0.8 mg/dL (ref 0.7–1.3)
Potassium: 4.4 mEq/L (ref 3.5–5.1)
Sodium: 141 mEq/L (ref 136–145)
Total Bilirubin: 0.89 mg/dL (ref 0.20–1.20)
Total Protein: 7.6 g/dL (ref 6.4–8.3)

## 2013-03-14 LAB — CBC WITH DIFFERENTIAL/PLATELET
BASO%: 2.3 % — ABNORMAL HIGH (ref 0.0–2.0)
EOS%: 2.8 % (ref 0.0–7.0)
HCT: 35.2 % — ABNORMAL LOW (ref 38.4–49.9)
LYMPH%: 25.3 % (ref 14.0–49.0)
MCH: 27.1 pg — ABNORMAL LOW (ref 27.2–33.4)
MCHC: 30.9 g/dL — ABNORMAL LOW (ref 32.0–36.0)
NEUT%: 61.6 % (ref 39.0–75.0)
Platelets: 857 10*3/uL — ABNORMAL HIGH (ref 140–400)
RBC: 4 10*6/uL — ABNORMAL LOW (ref 4.20–5.82)
WBC: 11.5 10*3/uL — ABNORMAL HIGH (ref 4.0–10.3)
lymph#: 2.9 10*3/uL (ref 0.9–3.3)

## 2013-03-19 ENCOUNTER — Encounter: Payer: Self-pay | Admitting: Internal Medicine

## 2013-03-21 ENCOUNTER — Other Ambulatory Visit (HOSPITAL_BASED_OUTPATIENT_CLINIC_OR_DEPARTMENT_OTHER): Payer: Medicare Other

## 2013-03-21 DIAGNOSIS — D473 Essential (hemorrhagic) thrombocythemia: Secondary | ICD-10-CM

## 2013-03-21 LAB — CBC WITH DIFFERENTIAL/PLATELET
BASO%: 2.5 % — ABNORMAL HIGH (ref 0.0–2.0)
Basophils Absolute: 0.2 10*3/uL — ABNORMAL HIGH (ref 0.0–0.1)
EOS%: 3.2 % (ref 0.0–7.0)
HCT: 31.4 % — ABNORMAL LOW (ref 38.4–49.9)
HGB: 10.5 g/dL — ABNORMAL LOW (ref 13.0–17.1)
LYMPH%: 27.7 % (ref 14.0–49.0)
MCH: 28.7 pg (ref 27.2–33.4)
MCV: 86.1 fL (ref 79.3–98.0)
NEUT%: 56.7 % (ref 39.0–75.0)
Platelets: 749 10*3/uL — ABNORMAL HIGH (ref 140–400)
WBC: 8.6 10*3/uL (ref 4.0–10.3)
lymph#: 2.4 10*3/uL (ref 0.9–3.3)

## 2013-03-21 LAB — COMPREHENSIVE METABOLIC PANEL (CC13)
ALT: 17 U/L (ref 0–55)
AST: 16 U/L (ref 5–34)
Anion Gap: 9 mEq/L (ref 3–11)
BUN: 20.3 mg/dL (ref 7.0–26.0)
Calcium: 9.3 mg/dL (ref 8.4–10.4)
Chloride: 106 mEq/L (ref 98–109)
Creatinine: 1 mg/dL (ref 0.7–1.3)
Glucose: 130 mg/dl (ref 70–140)
Sodium: 140 mEq/L (ref 136–145)
Total Bilirubin: 0.97 mg/dL (ref 0.20–1.20)

## 2013-03-28 ENCOUNTER — Other Ambulatory Visit (HOSPITAL_BASED_OUTPATIENT_CLINIC_OR_DEPARTMENT_OTHER): Payer: Medicare Other

## 2013-03-28 DIAGNOSIS — D473 Essential (hemorrhagic) thrombocythemia: Secondary | ICD-10-CM

## 2013-03-28 LAB — COMPREHENSIVE METABOLIC PANEL (CC13)
Albumin: 4.1 g/dL (ref 3.5–5.0)
Anion Gap: 8 mEq/L (ref 3–11)
BUN: 21.9 mg/dL (ref 7.0–26.0)
CO2: 26 mEq/L (ref 22–29)
Calcium: 9.4 mg/dL (ref 8.4–10.4)
Chloride: 105 mEq/L (ref 98–109)
Glucose: 139 mg/dl (ref 70–140)
Potassium: 4.3 mEq/L (ref 3.5–5.1)
Sodium: 139 mEq/L (ref 136–145)

## 2013-03-28 LAB — CBC WITH DIFFERENTIAL/PLATELET
Basophils Absolute: 0.2 10*3/uL — ABNORMAL HIGH (ref 0.0–0.1)
Eosinophils Absolute: 0.4 10*3/uL (ref 0.0–0.5)
HGB: 10.3 g/dL — ABNORMAL LOW (ref 13.0–17.1)
MCV: 86.6 fL (ref 79.3–98.0)
MONO#: 1 10*3/uL — ABNORMAL HIGH (ref 0.1–0.9)
NEUT#: 5.4 10*3/uL (ref 1.5–6.5)
RBC: 3.71 10*6/uL — ABNORMAL LOW (ref 4.20–5.82)
RDW: 30.4 % — ABNORMAL HIGH (ref 11.0–14.6)
lymph#: 2.8 10*3/uL (ref 0.9–3.3)

## 2013-04-01 ENCOUNTER — Ambulatory Visit: Payer: Medicare Other | Admitting: Internal Medicine

## 2013-04-03 ENCOUNTER — Ambulatory Visit (HOSPITAL_BASED_OUTPATIENT_CLINIC_OR_DEPARTMENT_OTHER): Payer: Medicare Other | Admitting: Internal Medicine

## 2013-04-03 ENCOUNTER — Other Ambulatory Visit (HOSPITAL_BASED_OUTPATIENT_CLINIC_OR_DEPARTMENT_OTHER): Payer: Medicare Other

## 2013-04-03 ENCOUNTER — Telehealth: Payer: Self-pay | Admitting: Internal Medicine

## 2013-04-03 ENCOUNTER — Encounter: Payer: Self-pay | Admitting: Internal Medicine

## 2013-04-03 VITALS — BP 142/76 | HR 69 | Temp 97.1°F | Resp 18 | Ht 67.0 in | Wt 159.2 lb

## 2013-04-03 DIAGNOSIS — D473 Essential (hemorrhagic) thrombocythemia: Secondary | ICD-10-CM

## 2013-04-03 LAB — CBC WITH DIFFERENTIAL/PLATELET
Eosinophils Absolute: 0.4 10*3/uL (ref 0.0–0.5)
HCT: 31.8 % — ABNORMAL LOW (ref 38.4–49.9)
HGB: 10.3 g/dL — ABNORMAL LOW (ref 13.0–17.1)
MONO#: 0.8 10*3/uL (ref 0.1–0.9)
MONO%: 10.3 % (ref 0.0–14.0)
NEUT#: 4.4 10*3/uL (ref 1.5–6.5)
RBC: 3.78 10*6/uL — ABNORMAL LOW (ref 4.20–5.82)
RDW: 29.4 % — ABNORMAL HIGH (ref 11.0–14.6)
WBC: 8.1 10*3/uL (ref 4.0–10.3)
nRBC: 1 % — ABNORMAL HIGH (ref 0–0)

## 2013-04-03 LAB — COMPREHENSIVE METABOLIC PANEL (CC13)
ALT: 16 U/L (ref 0–55)
Alkaline Phosphatase: 60 U/L (ref 40–150)
Anion Gap: 9 mEq/L (ref 3–11)
BUN: 18.4 mg/dL (ref 7.0–26.0)
CO2: 25 mEq/L (ref 22–29)
Calcium: 9.3 mg/dL (ref 8.4–10.4)
Chloride: 107 mEq/L (ref 98–109)
Creatinine: 0.8 mg/dL (ref 0.7–1.3)
Glucose: 113 mg/dl (ref 70–140)
Sodium: 142 mEq/L (ref 136–145)
Total Bilirubin: 0.97 mg/dL (ref 0.20–1.20)
Total Protein: 7.2 g/dL (ref 6.4–8.3)

## 2013-04-03 NOTE — Patient Instructions (Signed)
We will change the dose of hydroxyurea to 500 mg by mouth twice a day. Follow up visit in 3 weeks.

## 2013-04-03 NOTE — Progress Notes (Signed)
[facility]  [phone] [phone] OFFICE PROGRESS NOTE  Garlan Fillers, MD [address] [place] [address]  DIAGNOSIS: 1.  Essential thrombocythemia with positive JAK2 mutation 2. Leukocytosis  PRIOR THERAPY: None  CURRENT THERAPY: Hydrea 500 mg by mouth twice daily.  INTERVAL HISTORY: Roy Johnson 67 y.o. male returns for a scheduled regular office visit for followup of his recently diagnosed essential thrombocythemia. The patient was started on treatment with hydroxyurea 500 mg by mouth daily and tolerating it fairly well with no significant adverse effects. He denied having any significant alopecia, no fever or chills, no nausea or vomiting. The patient denied having any significant weight loss or night sweats. The patient denied having any chest pain, shortness of breath, cough or hemoptysis. He complains today of rumbling sensation in his head as well as intermittent weakness in the lower extremity.  MEDICAL HISTORY: Past Medical History  Diagnosis Date  . Hyperlipidemia   . Anemia   . Prostate cancer                 . Colon polyp   . Diverticulosis   . GERD (gastroesophageal reflux disease)   . Inguinal hernia   . Umbilical hernia   . Diverticulitis     ALLERGIES:  has No Known Allergies.  MEDICATIONS:  Current Outpatient Prescriptions  Medication Sig Dispense Refill  . hydroxyurea (HYDREA) 500 MG capsule Take 1 capsule (500 mg total) by mouth daily. May take with food to minimize GI side effects.  30 capsule  2   No current facility-administered medications for this visit.    SURGICAL HISTORY:  Past Surgical History  Procedure Laterality Date  . Prostatectomy    . Tonsillectomy    . Colonoscopy    . Polypectomy      REVIEW OF SYSTEMS:  Constitutional: positive for fatigue Eyes: negative Ears, nose, mouth, throat, and face: negative Respiratory: negative Cardiovascular: negative Gastrointestinal: positive  for abdominal pain and Left lower quadrant pain and a patient with known diverticulitis currently completing a course of antibiotics Genitourinary:negative Integument/breast: negative Hematologic/lymphatic: negative Musculoskeletal:negative Neurological: negative Behavioral/Psych: negative Endocrine: negative Allergic/Immunologic: negative   PHYSICAL EXAMINATION: General appearance: alert, cooperative, appears stated age and no distress Head: Normocephalic, without obvious abnormality, atraumatic Neck: no adenopathy, no carotid bruit, no JVD, supple, symmetrical, trachea midline and thyroid not enlarged, symmetric, no tenderness/mass/nodules Lymph nodes: Cervical, supraclavicular, and axillary nodes normal. Resp: clear to auscultation bilaterally Back: symmetric, no curvature. ROM normal. No CVA tenderness. Cardio: regular rate and rhythm, S1, S2 normal, no murmur, click, rub or gallop GI: mild left lower quadrant tenderness without rebound or referred pain, remainder the abdomen is soft nontender no appreciable organomegaly, bowel sounds present and normally active in all quadrants Extremities: extremities normal, atraumatic, no cyanosis or edema Neurologic: Alert and oriented X 3, normal strength and tone. Normal symmetric reflexes. Normal coordination and gait  ECOG PERFORMANCE STATUS: 1 - Symptomatic but completely ambulatory  Blood pressure 142/76, pulse 69, temperature 97.1 F (36.2 C), temperature source Oral, resp. rate 18, height 5\' 7"  (1.702 m), weight 159 lb 3.2 oz (72.213 kg), SpO2 100.00%.  LABORATORY DATA: Lab Results  Component Value Date   WBC 8.1 04/03/2013   HGB 10.3* 04/03/2013   HCT 31.8* 04/03/2013   MCV 84.1 04/03/2013   PLT 729* 04/03/2013      Chemistry      Component Value Date/Time   NA 139 03/28/2013 1253   K 4.3 03/28/2013 1253  CO2 26 03/28/2013 1253   BUN 21.9 03/28/2013 1253   CREATININE 0.9 03/28/2013 1253      Component Value Date/Time    CALCIUM 9.4 03/28/2013 1253   ALKPHOS 58 03/28/2013 1253   AST 16 03/28/2013 1253   ALT 16 03/28/2013 1253   BILITOT 1.07 03/28/2013 1253       RADIOGRAPHIC STUDIES:  Ct Abdomen Pelvis W Contrast  02/25/2013   CLINICAL DATA:  Left lower quadrant pain despite antibiotics. History of prostate cancer.  EXAM: CT ABDOMEN AND PELVIS WITH CONTRAST  TECHNIQUE: Multidetector CT imaging of the abdomen and pelvis was performed using the standard protocol following bolus administration of intravenous contrast.  CONTRAST:  80mL OMNIPAQUE IOHEXOL 300 MG/ML  SOLN  COMPARISON:  06/06/2011 and 07/05/2005.  CT.  FINDINGS: Lung bases clear.  Sigmoid diverticula and muscular hypertrophy. In the surrounding fat planes, curvilinear thickening suggestive of result of prior inflammation although changes of subtle acute inflammation not entirely excluded given the patient's history. No evidence of abscess or free intraperitoneal air.  One of the low-density liver lesions located within the dome of the liver has increased slightly in size now measuring 8 mm versus prior 7 mm and is too small to characterize.  Post prostatectomy. No pelvic adenopathy. Non contrast filled views of the urinary bladder unremarkable.  Heart size top-normal.  Atherosclerotic type changes lower abdominal aorta are and iliac arteries without aneurysmal dilation.  No focal splenic, pancreatic, adrenal or renal lesion.  No calcified gallstones.  Mild degenerative changes lumbar spine.  Minimal sclerosis right ischium unchanged.  IMPRESSION: Sigmoid diverticula and muscular hypertrophy. In the surrounding fat planes, curvilinear thickening suggestive of result of prior inflammation although changes of subtle acute inflammation not entirely excluded given the patient's history. No evidence of abscess or free intraperitoneal air.  One of the low-density liver lesions located within the dome of the liver has increased slightly in size now measuring 8 mm  versus prior 7 mm and is too small to characterize.  Post prostatectomy. No pelvic adenopathy.  Atherosclerotic type changes lower abdominal aorta are and iliac arteries without aneurysmal dilation.  Minimal sclerosis right ischium unchanged.  These results will be called to the ordering clinician or representative by the Radiologist Assistant, and communication documented in the PACS Dashboard.   Electronically Signed   By: Bridgett Larsson M.D.   On: 02/25/2013 09:53     ASSESSMENT: Molecular studies are as follows BCR/able was not detected therefore negative for CML. 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 Hemoglobin mildly anemic at 11.2. The patient still has a leukocytosis however the total white count is now 11.6 where previously it was 14.3 and regarding his thrombocytosis it was 1071 now 914. This is consistent with essential thrombocythemia.  PLAN: This is a very pleasant 67 years old recently diagnosed with essential thrombocythemia with positive JAK-2 mutation.  The patient is currently on treatment with hydroxyurea 500 mg by mouth daily but his platelets count are still elevated over 700,000. I recommended for him to change the dose of hydroxyurea to 500 mg by mouth twice a day. I will continue to monitor his CBC closely.  He would come back for follow up visit in 3 weeks for reevaluation. He was advised to call immediately if he has any concerning symptoms in the  interval.  All questions were answered. The patient knows to call the clinic with any problems, questions or concerns. We can certainly see the patient much sooner if necessary.   Lajuana Matte., MD 03/02/2013

## 2013-04-03 NOTE — Telephone Encounter (Signed)
GAve pt appt for labs and MD visit for JAnuary 2015

## 2013-04-12 ENCOUNTER — Other Ambulatory Visit (HOSPITAL_BASED_OUTPATIENT_CLINIC_OR_DEPARTMENT_OTHER): Payer: Medicare Other

## 2013-04-12 DIAGNOSIS — D473 Essential (hemorrhagic) thrombocythemia: Secondary | ICD-10-CM

## 2013-04-12 LAB — CBC WITH DIFFERENTIAL/PLATELET
BASO%: 1.6 % (ref 0.0–2.0)
BASOS ABS: 0.1 10*3/uL (ref 0.0–0.1)
EOS ABS: 0.2 10*3/uL (ref 0.0–0.5)
EOS%: 2.9 % (ref 0.0–7.0)
HCT: 32.8 % — ABNORMAL LOW (ref 38.4–49.9)
HEMOGLOBIN: 10.7 g/dL — AB (ref 13.0–17.1)
LYMPH%: 26.7 % (ref 14.0–49.0)
MCH: 27.7 pg (ref 27.2–33.4)
MCHC: 32.6 g/dL (ref 32.0–36.0)
MCV: 85 fL (ref 79.3–98.0)
MONO#: 0.8 10*3/uL (ref 0.1–0.9)
MONO%: 10.2 % (ref 0.0–14.0)
NEUT#: 4.3 10*3/uL (ref 1.5–6.5)
NEUT%: 58.6 % (ref 39.0–75.0)
PLATELETS: 792 10*3/uL — AB (ref 140–400)
RBC: 3.86 10*6/uL — AB (ref 4.20–5.82)
RDW: 30.3 % — ABNORMAL HIGH (ref 11.0–14.6)
WBC: 7.3 10*3/uL (ref 4.0–10.3)
lymph#: 2 10*3/uL (ref 0.9–3.3)
nRBC: 1 % — ABNORMAL HIGH (ref 0–0)

## 2013-04-12 LAB — COMPREHENSIVE METABOLIC PANEL (CC13)
ALT: 19 U/L (ref 0–55)
ANION GAP: 9 meq/L (ref 3–11)
AST: 15 U/L (ref 5–34)
Albumin: 4.3 g/dL (ref 3.5–5.0)
Alkaline Phosphatase: 56 U/L (ref 40–150)
BILIRUBIN TOTAL: 1.08 mg/dL (ref 0.20–1.20)
BUN: 24.4 mg/dL (ref 7.0–26.0)
CO2: 24 meq/L (ref 22–29)
CREATININE: 0.9 mg/dL (ref 0.7–1.3)
Calcium: 9.3 mg/dL (ref 8.4–10.4)
Chloride: 107 mEq/L (ref 98–109)
GLUCOSE: 115 mg/dL (ref 70–140)
Potassium: 4.3 mEq/L (ref 3.5–5.1)
Sodium: 140 mEq/L (ref 136–145)
Total Protein: 7.5 g/dL (ref 6.4–8.3)

## 2013-04-16 ENCOUNTER — Encounter: Payer: Self-pay | Admitting: Internal Medicine

## 2013-04-16 ENCOUNTER — Other Ambulatory Visit: Payer: Self-pay | Admitting: *Deleted

## 2013-04-16 DIAGNOSIS — D473 Essential (hemorrhagic) thrombocythemia: Secondary | ICD-10-CM

## 2013-04-16 MED ORDER — HYDROXYUREA 500 MG PO CAPS: 500.0000 mg | ORAL_CAPSULE | Freq: Two times a day (BID) | ORAL | Status: DC

## 2013-04-22 ENCOUNTER — Encounter: Payer: Self-pay | Admitting: Internal Medicine

## 2013-04-22 ENCOUNTER — Other Ambulatory Visit (HOSPITAL_BASED_OUTPATIENT_CLINIC_OR_DEPARTMENT_OTHER): Payer: Medicare Other

## 2013-04-22 ENCOUNTER — Ambulatory Visit (HOSPITAL_BASED_OUTPATIENT_CLINIC_OR_DEPARTMENT_OTHER): Payer: Medicare Other | Admitting: Internal Medicine

## 2013-04-22 ENCOUNTER — Telehealth: Payer: Self-pay | Admitting: Internal Medicine

## 2013-04-22 VITALS — BP 134/81 | HR 77 | Temp 98.2°F | Resp 18 | Ht 67.0 in | Wt 158.2 lb

## 2013-04-22 DIAGNOSIS — R5381 Other malaise: Secondary | ICD-10-CM

## 2013-04-22 DIAGNOSIS — D473 Essential (hemorrhagic) thrombocythemia: Secondary | ICD-10-CM

## 2013-04-22 DIAGNOSIS — R5383 Other fatigue: Secondary | ICD-10-CM

## 2013-04-22 LAB — CBC WITH DIFFERENTIAL/PLATELET
BASO%: 2.1 % — AB (ref 0.0–2.0)
BASOS ABS: 0.1 10*3/uL (ref 0.0–0.1)
EOS%: 3.3 % (ref 0.0–7.0)
Eosinophils Absolute: 0.2 10*3/uL (ref 0.0–0.5)
HEMATOCRIT: 30.6 % — AB (ref 38.4–49.9)
HEMOGLOBIN: 10.3 g/dL — AB (ref 13.0–17.1)
LYMPH#: 2.3 10*3/uL (ref 0.9–3.3)
LYMPH%: 31.8 % (ref 14.0–49.0)
MCH: 29.9 pg (ref 27.2–33.4)
MCHC: 33.5 g/dL (ref 32.0–36.0)
MCV: 89.2 fL (ref 79.3–98.0)
MONO#: 0.7 10*3/uL (ref 0.1–0.9)
MONO%: 9.6 % (ref 0.0–14.0)
NEUT#: 3.8 10*3/uL (ref 1.5–6.5)
NEUT%: 53.2 % (ref 39.0–75.0)
Platelets: 536 10*3/uL — ABNORMAL HIGH (ref 140–400)
RBC: 3.43 10*6/uL — ABNORMAL LOW (ref 4.20–5.82)
RDW: 32.9 % — ABNORMAL HIGH (ref 11.0–14.6)
WBC: 7.1 10*3/uL (ref 4.0–10.3)

## 2013-04-22 LAB — COMPREHENSIVE METABOLIC PANEL (CC13)
ALT: 20 U/L (ref 0–55)
ANION GAP: 9 meq/L (ref 3–11)
AST: 15 U/L (ref 5–34)
Albumin: 4.1 g/dL (ref 3.5–5.0)
Alkaline Phosphatase: 53 U/L (ref 40–150)
BUN: 19.8 mg/dL (ref 7.0–26.0)
CALCIUM: 9.5 mg/dL (ref 8.4–10.4)
CHLORIDE: 107 meq/L (ref 98–109)
CO2: 25 mEq/L (ref 22–29)
CREATININE: 0.8 mg/dL (ref 0.7–1.3)
Glucose: 100 mg/dl (ref 70–140)
Potassium: 4.7 mEq/L (ref 3.5–5.1)
Sodium: 141 mEq/L (ref 136–145)
Total Bilirubin: 1.23 mg/dL — ABNORMAL HIGH (ref 0.20–1.20)
Total Protein: 7.1 g/dL (ref 6.4–8.3)

## 2013-04-22 LAB — LACTATE DEHYDROGENASE (CC13): LDH: 175 U/L (ref 125–245)

## 2013-04-22 NOTE — Telephone Encounter (Signed)
GV AND PRINTED APPT SCHED AND AVS FOR PT FOR aPRIL °

## 2013-04-22 NOTE — Progress Notes (Signed)
[facility]  [phone] [phone] OFFICE PROGRESS NOTE  [name], MD [place] [address]  DIAGNOSIS: 1.  Essential thrombocythemia with positive JAK2 mutation 2. Leukocytosis.   PRIOR THERAPY: None  CURRENT THERAPY: Hydrea 500 mg by mouth twice daily.  INTERVAL HISTORY: Roy Johnson 68 y.o. male returns for a scheduled regular office visit for followup of his recently diagnosed essential thrombocythemia. He is currently on treatment with hydroxyurea 500 mg by mouth twice a day. He is tolerating his treatment fairly well with no significant adverse effect but continues to have mild fatigue. He denied having any significant alopecia, no fever or chills, no nausea or vomiting. The patient denied having any significant weight loss or night sweats. The patient denied having any chest pain, shortness of breath, cough or hemoptysis. He is planning to go to [place] for the next 2 months and would like his blood work to be done there for the next 2 months.   MEDICAL HISTORY: Past Medical History  Diagnosis Date  . Hyperlipidemia   . Anemia   . Prostate cancer                 . Colon polyp   . Diverticulosis   . GERD (gastroesophageal reflux disease)   . Inguinal hernia   . Umbilical hernia   . Diverticulitis     ALLERGIES:  has No Known Allergies.  MEDICATIONS:  Current Outpatient Prescriptions  Medication Sig Dispense Refill  . hydroxyurea (HYDREA) 500 MG capsule Take 1 capsule (500 mg total) by mouth 2 (two) times daily. Or as directed by MD.  May take with food to minimize GI side effects.  90 capsule  2   No current facility-administered medications for this visit.    SURGICAL HISTORY:  Past Surgical History  Procedure Laterality Date  . Prostatectomy    . Tonsillectomy    . Colonoscopy    . Polypectomy      REVIEW OF SYSTEMS:  Constitutional: positive for fatigue Eyes: negative Ears, nose,  mouth, throat, and face: negative Respiratory: negative Cardiovascular: negative Gastrointestinal: positive for abdominal pain and Left lower quadrant pain and a patient with known diverticulitis currently completing a course of antibiotics Genitourinary:negative Integument/breast: negative Hematologic/lymphatic: negative Musculoskeletal:negative Neurological: negative Behavioral/Psych: negative Endocrine: negative Allergic/Immunologic: negative   PHYSICAL EXAMINATION: General appearance: alert, cooperative, appears stated age and no distress Head: Normocephalic, without obvious abnormality, atraumatic Neck: no adenopathy, no carotid bruit, no JVD, supple, symmetrical, trachea midline and thyroid not enlarged, symmetric, no tenderness/mass/nodules Lymph nodes: Cervical, supraclavicular, and axillary nodes normal. Resp: clear to auscultation bilaterally Back: symmetric, no curvature. ROM normal. No CVA tenderness. Cardio: regular rate and rhythm, S1, S2 normal, no murmur, click, rub or gallop GI: mild left lower quadrant tenderness without rebound or referred pain, remainder the abdomen is soft nontender no appreciable organomegaly, bowel sounds present and normally active in all quadrants Extremities: extremities normal, atraumatic, no cyanosis or edema Neurologic: Alert and oriented X 3, normal strength and tone. Normal symmetric reflexes. Normal coordination and gait  ECOG PERFORMANCE STATUS: 1 - Symptomatic but completely ambulatory  Blood pressure 134/81, pulse 77, temperature 98.2 F (36.8 C), temperature source Oral, resp. rate 18, height 5\' 7"  (1.702 m), weight 158 lb 3.2 oz (71.759 kg).  LABORATORY DATA: Lab Results  Component Value Date   WBC 7.1 04/22/2013   HGB 10.3* 04/22/2013   HCT 30.6* 04/22/2013   MCV 89.2 04/22/2013   PLT  536* 04/22/2013      Chemistry      Component Value Date/Time   NA 140 04/12/2013 1145   K 4.3 04/12/2013 1145   CO2 24 04/12/2013 1145   BUN 24.4  04/12/2013 1145   CREATININE 0.9 04/12/2013 1145      Component Value Date/Time   CALCIUM 9.3 04/12/2013 1145   ALKPHOS 56 04/12/2013 1145   AST 15 04/12/2013 1145   ALT 19 04/12/2013 1145   BILITOT 1.08 04/12/2013 1145       RADIOGRAPHIC STUDIES:  ASSESSMENT AND PLAN:  This is a very pleasant 68 years old recently diagnosed with essential thrombocythemia with positive JAK-2 mutation.  The patient is currently on treatment with hydroxyurea 500 mg by mouth twice a day and is significant improvement in his platelets count which is currently down to 536,000. I recommended for him to continue his current treatment with hydroxyurea to 500 mg by mouth twice a day. I will continue to monitor his CBC closely.  I gave him prescription for CBC and comprehensive metabolic panel to be done monthly during his stay in [place]. He would come back for follow up visit in 3 months for reevaluation. He was advised to call immediately if he has any concerning symptoms in the interval.  All questions were answered. The patient knows to call the clinic with any problems, questions or concerns. We can certainly see the patient much sooner if necessary.  Disclaimer: This note was dictated with voice recognition software. Similar sounding words can inadvertently be transcribed and may not be corrected upon review.

## 2013-05-28 ENCOUNTER — Encounter: Payer: Self-pay | Admitting: Internal Medicine

## 2013-05-29 ENCOUNTER — Telehealth: Payer: Self-pay | Admitting: *Deleted

## 2013-05-29 NOTE — Telephone Encounter (Signed)
Per pt request, lab work sent from [facility] reviewed with pt   CBC:   WBC: 6.7 ANC 4.4 HBG: 8.5 Plt Count 394  Lab work reviewed by Dr. Julien Nordmann.  Per Dr Vista Mink, pt needs to alternate every other day he is taking 2 tablets, then 1 tablet.  Called and left vm with instructions and to call back with any questions.  SLJ

## 2013-06-21 ENCOUNTER — Encounter: Payer: Self-pay | Admitting: Internal Medicine

## 2013-06-24 ENCOUNTER — Telehealth: Payer: Self-pay | Admitting: *Deleted

## 2013-06-24 NOTE — Telephone Encounter (Signed)
Labs from [facility] dated 06/19/13:  WBC: 5.1 HBG 7.8 Plts: 359.  Per Dr Vista Mink, ok to decrease hydrea to 1 tablet daily.  Left msg on vm with instructions, also called and left msg on home #.  SLJ

## 2013-07-23 ENCOUNTER — Telehealth: Payer: Self-pay | Admitting: Internal Medicine

## 2013-07-23 ENCOUNTER — Encounter: Payer: Self-pay | Admitting: Internal Medicine

## 2013-07-23 ENCOUNTER — Ambulatory Visit (HOSPITAL_BASED_OUTPATIENT_CLINIC_OR_DEPARTMENT_OTHER): Payer: Medicare Other | Admitting: Internal Medicine

## 2013-07-23 ENCOUNTER — Other Ambulatory Visit (HOSPITAL_BASED_OUTPATIENT_CLINIC_OR_DEPARTMENT_OTHER): Payer: Medicare Other

## 2013-07-23 VITALS — BP 126/67 | HR 62 | Temp 97.6°F | Resp 19 | Ht 67.0 in | Wt 158.6 lb

## 2013-07-23 DIAGNOSIS — D473 Essential (hemorrhagic) thrombocythemia: Secondary | ICD-10-CM

## 2013-07-23 DIAGNOSIS — D72829 Elevated white blood cell count, unspecified: Secondary | ICD-10-CM

## 2013-07-23 LAB — COMPREHENSIVE METABOLIC PANEL (CC13)
ALK PHOS: 60 U/L (ref 40–150)
ALT: 12 U/L (ref 0–55)
AST: 12 U/L (ref 5–34)
Albumin: 4.2 g/dL (ref 3.5–5.0)
Anion Gap: 6 mEq/L (ref 3–11)
BUN: 20.6 mg/dL (ref 7.0–26.0)
CALCIUM: 9.4 mg/dL (ref 8.4–10.4)
CHLORIDE: 110 meq/L — AB (ref 98–109)
CO2: 26 mEq/L (ref 22–29)
Creatinine: 0.8 mg/dL (ref 0.7–1.3)
Glucose: 95 mg/dl (ref 70–140)
Potassium: 4.9 mEq/L (ref 3.5–5.1)
Sodium: 142 mEq/L (ref 136–145)
Total Bilirubin: 1.01 mg/dL (ref 0.20–1.20)
Total Protein: 7.1 g/dL (ref 6.4–8.3)

## 2013-07-23 LAB — CBC WITH DIFFERENTIAL/PLATELET
BASO%: 1 % (ref 0.0–2.0)
BASOS ABS: 0.1 10*3/uL (ref 0.0–0.1)
EOS%: 2.2 % (ref 0.0–7.0)
Eosinophils Absolute: 0.1 10*3/uL (ref 0.0–0.5)
HCT: 30.9 % — ABNORMAL LOW (ref 38.4–49.9)
HGB: 10.1 g/dL — ABNORMAL LOW (ref 13.0–17.1)
LYMPH%: 23.4 % (ref 14.0–49.0)
MCH: 39.3 pg — AB (ref 27.2–33.4)
MCHC: 32.8 g/dL (ref 32.0–36.0)
MCV: 119.6 fL — ABNORMAL HIGH (ref 79.3–98.0)
MONO#: 0.5 10*3/uL (ref 0.1–0.9)
MONO%: 8.6 % (ref 0.0–14.0)
NEUT#: 3.5 10*3/uL (ref 1.5–6.5)
NEUT%: 64.8 % (ref 39.0–75.0)
PLATELETS: 491 10*3/uL — AB (ref 140–400)
RBC: 2.58 10*6/uL — ABNORMAL LOW (ref 4.20–5.82)
RDW: 27.8 % — ABNORMAL HIGH (ref 11.0–14.6)
WBC: 5.4 10*3/uL (ref 4.0–10.3)
lymph#: 1.3 10*3/uL (ref 0.9–3.3)

## 2013-07-23 LAB — LACTATE DEHYDROGENASE (CC13): LDH: 189 U/L (ref 125–245)

## 2013-07-23 NOTE — Telephone Encounter (Signed)
gv and printed appt sched and avs for pt for May °

## 2013-07-23 NOTE — Progress Notes (Signed)
[facility]  [phone] [phone] OFFICE PROGRESS NOTE  [name], MD [place] [address]  DIAGNOSIS: 1.  Essential thrombocythemia with positive JAK2 mutation 2. Leukocytosis.   PRIOR THERAPY: None  CURRENT THERAPY: Hydrea 500 mg by mouth once daily.  INTERVAL HISTORY: Roy Johnson 68 y.o. male returns for a scheduled regular office visit for followup of his recently diagnosed essential thrombocythemia. He enjoyed his vacation in [place] for the last 2 months. He has increasing fatigue and weakness and he was taking hydroxyurea twice a day during the first month. He is currently on treatment with hydroxyurea 500 mg by mouth once a day. He is tolerating his treatment fairly well with no significant adverse effect but continues to have mild fatigue. He also complaining of occasional nausea as well as bloating. He is expected to see his gastroenterologist Dr. Henrene Pastor in the next few weeks for evaluation of this condition. He denied having any significant alopecia, no fever or chills. The patient denied having any significant weight loss or night sweats. The patient denied having any chest pain, shortness of breath, cough or hemoptysis.   MEDICAL HISTORY: Past Medical History  Diagnosis Date  . Hyperlipidemia   . Anemia   . Prostate cancer                 . Colon polyp   . Diverticulosis   . GERD (gastroesophageal reflux disease)   . Inguinal hernia   . Umbilical hernia   . Diverticulitis     ALLERGIES:  has No Known Allergies.  MEDICATIONS:  Current Outpatient Prescriptions  Medication Sig Dispense Refill  . hydroxyurea (HYDREA) 500 MG capsule Take 1 capsule (500 mg total) by mouth 2 (two) times daily. Or as directed by MD.  May take with food to minimize GI side effects.  90 capsule  2   No current facility-administered medications for this visit.    SURGICAL HISTORY:  Past Surgical History  Procedure  Laterality Date  . Prostatectomy    . Tonsillectomy    . Colonoscopy    . Polypectomy      REVIEW OF SYSTEMS:  Constitutional: positive for fatigue Eyes: negative Ears, nose, mouth, throat, and face: negative Respiratory: negative Cardiovascular: negative Gastrointestinal: positive for abdominal pain and Left lower quadrant pain and a patient with known diverticulitis currently completing a course of antibiotics Genitourinary:negative Integument/breast: negative Hematologic/lymphatic: negative Musculoskeletal:negative Neurological: negative Behavioral/Psych: negative Endocrine: negative Allergic/Immunologic: negative   PHYSICAL EXAMINATION: General appearance: alert, cooperative, appears stated age and no distress Head: Normocephalic, without obvious abnormality, atraumatic Neck: no adenopathy, no carotid bruit, no JVD, supple, symmetrical, trachea midline and thyroid not enlarged, symmetric, no tenderness/mass/nodules Lymph nodes: Cervical, supraclavicular, and axillary nodes normal. Resp: clear to auscultation bilaterally Back: symmetric, no curvature. ROM normal. No CVA tenderness. Cardio: regular rate and rhythm, S1, S2 normal, no murmur, click, rub or gallop GI: mild left lower quadrant tenderness without rebound or referred pain, remainder the abdomen is soft nontender no appreciable organomegaly, bowel sounds present and normally active in all quadrants Extremities: extremities normal, atraumatic, no cyanosis or edema Neurologic: Alert and oriented X 3, normal strength and tone. Normal symmetric reflexes. Normal coordination and gait  ECOG PERFORMANCE STATUS: 1 - Symptomatic but completely ambulatory  Blood pressure 126/67, pulse 62, temperature 97.6 F (36.4 C), temperature source Oral, resp. rate 19, height 5\' 7"  (1.702 m), weight 158 lb 9.6 oz (71.94 kg), SpO2 99.00%.  LABORATORY DATA: Lab  Results  Component Value Date   WBC 5.4 07/23/2013   HGB 10.1* 07/23/2013    HCT 30.9* 07/23/2013   MCV 119.6* 07/23/2013   PLT 491* 07/23/2013      Chemistry      Component Value Date/Time   NA 142 07/23/2013 0921   K 4.9 07/23/2013 0921   CO2 26 07/23/2013 0921   BUN 20.6 07/23/2013 0921   CREATININE 0.8 07/23/2013 0921      Component Value Date/Time   CALCIUM 9.4 07/23/2013 0921   ALKPHOS 60 07/23/2013 0921   AST 12 07/23/2013 0921   ALT 12 07/23/2013 0921   BILITOT 1.01 07/23/2013 0921       RADIOGRAPHIC STUDIES:  ASSESSMENT AND PLAN:  This is a very pleasant 68 years old recently diagnosed with essential thrombocythemia with positive JAK-2 mutation.  The patient is currently on treatment with hydroxyurea 500 mg by mouth once a day and has a stable platelets count which is currently down to 491,000. I recommended for him to continue his current treatment with hydroxyurea to 500 mg by mouth daily. I will continue to monitor his CBC closely with followup visit in one month.  He was advised to call immediately if he has any concerning symptoms in the interval.  All questions were answered. The patient knows to call the clinic with any problems, questions or concerns. We can certainly see the patient much sooner if necessary.  Disclaimer: This note was dictated with voice recognition software. Similar sounding words can inadvertently be transcribed and may not be corrected upon review.

## 2013-07-24 ENCOUNTER — Encounter: Payer: Self-pay | Admitting: *Deleted

## 2013-08-08 ENCOUNTER — Telehealth: Payer: Self-pay | Admitting: Medical Oncology

## 2013-08-09 NOTE — Telephone Encounter (Signed)
returned call -number invalid

## 2013-08-19 ENCOUNTER — Telehealth: Payer: Self-pay | Admitting: Medical Oncology

## 2013-08-19 NOTE — Telephone Encounter (Signed)
Faxed ICD 10 dx code.

## 2013-08-20 ENCOUNTER — Ambulatory Visit (HOSPITAL_BASED_OUTPATIENT_CLINIC_OR_DEPARTMENT_OTHER): Payer: Medicare Other | Admitting: Internal Medicine

## 2013-08-20 ENCOUNTER — Other Ambulatory Visit (HOSPITAL_BASED_OUTPATIENT_CLINIC_OR_DEPARTMENT_OTHER): Payer: Medicare Other

## 2013-08-20 ENCOUNTER — Telehealth: Payer: Self-pay | Admitting: Internal Medicine

## 2013-08-20 ENCOUNTER — Encounter: Payer: Self-pay | Admitting: Internal Medicine

## 2013-08-20 VITALS — BP 130/63 | HR 67 | Temp 97.5°F | Resp 19 | Ht 67.0 in | Wt 161.7 lb

## 2013-08-20 DIAGNOSIS — D473 Essential (hemorrhagic) thrombocythemia: Secondary | ICD-10-CM

## 2013-08-20 LAB — COMPREHENSIVE METABOLIC PANEL (CC13)
ALK PHOS: 63 U/L (ref 40–150)
ALT: 13 U/L (ref 0–55)
AST: 13 U/L (ref 5–34)
Albumin: 3.9 g/dL (ref 3.5–5.0)
Anion Gap: 9 mEq/L (ref 3–11)
BILIRUBIN TOTAL: 0.65 mg/dL (ref 0.20–1.20)
BUN: 22.6 mg/dL (ref 7.0–26.0)
CO2: 23 mEq/L (ref 22–29)
Calcium: 9.4 mg/dL (ref 8.4–10.4)
Chloride: 108 mEq/L (ref 98–109)
Creatinine: 0.8 mg/dL (ref 0.7–1.3)
Glucose: 116 mg/dl (ref 70–140)
Potassium: 4.5 mEq/L (ref 3.5–5.1)
SODIUM: 141 meq/L (ref 136–145)
TOTAL PROTEIN: 6.9 g/dL (ref 6.4–8.3)

## 2013-08-20 LAB — CBC WITH DIFFERENTIAL/PLATELET
BASO%: 0.6 % (ref 0.0–2.0)
BASOS ABS: 0 10*3/uL (ref 0.0–0.1)
EOS%: 3.6 % (ref 0.0–7.0)
Eosinophils Absolute: 0.2 10*3/uL (ref 0.0–0.5)
HCT: 29.9 % — ABNORMAL LOW (ref 38.4–49.9)
HEMOGLOBIN: 9.9 g/dL — AB (ref 13.0–17.1)
LYMPH%: 26 % (ref 14.0–49.0)
MCH: 35.1 pg — AB (ref 27.2–33.4)
MCHC: 33.1 g/dL (ref 32.0–36.0)
MCV: 106 fL — AB (ref 79.3–98.0)
MONO#: 0.6 10*3/uL (ref 0.1–0.9)
MONO%: 11.8 % (ref 0.0–14.0)
NEUT#: 3 10*3/uL (ref 1.5–6.5)
NEUT%: 58 % (ref 39.0–75.0)
PLATELETS: 525 10*3/uL — AB (ref 140–400)
RBC: 2.82 10*6/uL — AB (ref 4.20–5.82)
RDW: 27 % — AB (ref 11.0–14.6)
WBC: 5.2 10*3/uL (ref 4.0–10.3)
lymph#: 1.4 10*3/uL (ref 0.9–3.3)
nRBC: 0 % (ref 0–0)

## 2013-08-20 LAB — LACTATE DEHYDROGENASE (CC13): LDH: 178 U/L (ref 125–245)

## 2013-08-20 NOTE — Telephone Encounter (Signed)
gv adn printed appt sched and avs for pt for July  °

## 2013-08-20 NOTE — Progress Notes (Signed)
[facility]  Telephone:([phone] Fax:([phone] OFFICE PROGRESS NOTE  [name], MD [place] [address]  DIAGNOSIS: 1.  Essential thrombocythemia with positive JAK2 mutation 2. Leukocytosis.   PRIOR THERAPY: None  CURRENT THERAPY: Hydrea 500 mg by mouth once daily.  INTERVAL HISTORY: Roy Johnson 68 y.o. male returns for a scheduled regular office visit for follow up. The patient is doing well today except for persistent fatigue. He takes oral iron tablets once daily as prescribed by Dr. Sharlett Iles. He denied having any significant bleeding, bruises or ecchymosis. He is tolerating his treatment with hydroxyurea fairly well with no significant adverse effects. The patient denied having any significant fever or chills. He has no nausea or vomiting. He has no significant weight loss or night sweats. He has repeat CBC performed earlier today and he is here for evaluation and discussion of his lab results.  MEDICAL HISTORY: Past Medical History  Diagnosis Date  . Hyperlipidemia   . Anemia   . Prostate cancer                 . Colon polyp   . Diverticulosis   . GERD (gastroesophageal reflux disease)   . Inguinal hernia   . Umbilical hernia   . Diverticulitis     ALLERGIES:  has No Known Allergies.  MEDICATIONS:  Current Outpatient Prescriptions  Medication Sig Dispense Refill  . hydroxyurea (HYDREA) 500 MG capsule Take 500 mg by mouth daily. Or as directed by MD.  May take with food to minimize GI side effects.       No current facility-administered medications for this visit.    SURGICAL HISTORY:  Past Surgical History  Procedure Laterality Date  . Prostatectomy    . Tonsillectomy    . Colonoscopy    . Polypectomy      REVIEW OF SYSTEMS:  A comprehensive review of systems was negative except for: Constitutional: positive for fatigue   PHYSICAL EXAMINATION: General appearance: alert, cooperative, appears stated age  and no distress Head: Normocephalic, without obvious abnormality, atraumatic Neck: no adenopathy, no carotid bruit, no JVD, supple, symmetrical, trachea midline and thyroid not enlarged, symmetric, no tenderness/mass/nodules Lymph nodes: Cervical, supraclavicular, and axillary nodes normal. Resp: clear to auscultation bilaterally Back: symmetric, no curvature. ROM normal. No CVA tenderness. Cardio: regular rate and rhythm, S1, S2 normal, no murmur, click, rub or gallop GI: mild left lower quadrant tenderness without rebound or referred pain, remainder the abdomen is soft nontender no appreciable organomegaly, bowel sounds present and normally active in all quadrants Extremities: extremities normal, atraumatic, no cyanosis or edema Neurologic: Alert and oriented X 3, normal strength and tone. Normal symmetric reflexes. Normal coordination and gait  ECOG PERFORMANCE STATUS: 1 - Symptomatic but completely ambulatory  Blood pressure 130/63, pulse 67, temperature 97.5 F (36.4 C), temperature source Oral, resp. rate 19, height 5\' 7"  (1.702 m), weight 161 lb 11.2 oz (73.347 kg).  LABORATORY DATA: Lab Results  Component Value Date   WBC 5.2 08/20/2013   HGB 9.9* 08/20/2013   HCT 29.9* 08/20/2013   MCV 106.0* 08/20/2013   PLT 525* 08/20/2013      Chemistry      Component Value Date/Time   NA 142 07/23/2013 0921   K 4.9 07/23/2013 0921   CO2 26 07/23/2013 0921   BUN 20.6 07/23/2013 0921   CREATININE 0.8 07/23/2013 0921      Component Value Date/Time   CALCIUM 9.4 07/23/2013 0921   ALKPHOS  60 07/23/2013 0921   AST 12 07/23/2013 0921   ALT 12 07/23/2013 0921   BILITOT 1.01 07/23/2013 0921       RADIOGRAPHIC STUDIES:  ASSESSMENT AND PLAN:  This is a very pleasant 68 years old recently diagnosed with essential thrombocythemia with positive JAK-2 mutation.  The patient is currently on treatment with hydroxyurea 500 mg by mouth once a day and has a stable platelets count which is currently  525,000. I recommended for him to continue his current treatment with hydroxyurea to 500 mg by mouth daily. I will continue to monitor his CBC, comprehensive metabolic panel and LDH closely with followup visit in 2 months.  He was advised to call immediately if he has any concerning symptoms in the interval.  All questions were answered. The patient knows to call the clinic with any problems, questions or concerns. We can certainly see the patient much sooner if necessary.  Disclaimer: This note was dictated with voice recognition software. Similar sounding words can inadvertently be transcribed and may not be corrected upon review.

## 2013-08-28 ENCOUNTER — Ambulatory Visit (INDEPENDENT_AMBULATORY_CARE_PROVIDER_SITE_OTHER): Payer: Medicare Other | Admitting: Cardiovascular Disease

## 2013-08-28 ENCOUNTER — Encounter: Payer: Self-pay | Admitting: Cardiovascular Disease

## 2013-08-28 VITALS — BP 122/80 | HR 64 | Ht 67.0 in | Wt 160.6 lb

## 2013-08-28 DIAGNOSIS — R0789 Other chest pain: Secondary | ICD-10-CM

## 2013-08-28 NOTE — Assessment & Plan Note (Signed)
Roy Johnson  presents for further evaluation of an episode of chest pressure that occurred while he was digging a hole when he was out doing some landscaping. This episode of chest discomfort was very painful. Was associated with severe chest pressure. It was associated with diaphoresis and severe dyspnea. It lasted for several minutes and then eventually resolved. It has not recurred.  This is quite atypical for him. He exercises on a regular basis and does not have any similar symptoms.  Because of the nature of this chest discomfort, we should proceed with a stress echocardiogram. His resting EKG is normal and he  exercises on a regular basis.  I'll see him again in 2 months for followup visit. Advised him to call me sooner if he has any recurrent episodes of chest discomfort.

## 2013-08-28 NOTE — Progress Notes (Signed)
Vena Austria Date of Birth  [birth year]       Roy Johnson    [employer] [address], [address], [place], [place]  [address]   Roy Johnson,   [address] [place]   Fax  [phone]     Fax [phone]  Problem List: 1. Dyspnea 2. Essential thrombocythemia with positive JAK2 mutation 3. Anemia 4. Prostate cancer - s/p prostotectomy  History of Present Illness:  Roy Johnson is a 68 yo with hx of thrombcytosis.  He has had anemia for many years.   He has had some weakness.  He was doing some yard work ( digging a hole to plant a bush)  several months ago ( in [place]) and had severe chest pain/ chest pressure.    This was associated with dyspnea/ diaphoresis.    This discomfort lasted 1-2 minutes.    He did not go to the doctor.  The pain has not recurred to that degree.   He has been on Urea and thought that may be the cause.   He exercised regularly - 1 1/2  hours of exercise bike / ellipitical / walking.   Does not have any problems with his workouts.    He is retired from [employer].    Current Outpatient Prescriptions on File Prior to Visit  Medication Sig Dispense Refill  . hydroxyurea (HYDREA) 500 MG capsule Take 500 mg by mouth daily. Or as directed by MD.  May take with food to minimize GI side effects.       No current facility-administered medications on file prior to visit.    No Known Allergies  Past Medical History  Diagnosis Date  . Hyperlipidemia   . Anemia   . Prostate cancer                 . Colon polyp   . Diverticulosis   . GERD (gastroesophageal reflux disease)   . Inguinal hernia   . Umbilical hernia   . Diverticulitis     Past Surgical History  Procedure Laterality Date  . Prostatectomy    . Tonsillectomy    . Colonoscopy    . Polypectomy      History  Smoking status  . Former Smoker  . Quit date: 09/07/1973  Smokeless tobacco  . Never Used    History    Alcohol Use  . 3.5 oz/week  . 7 drink(s) per week    Family History  Problem Relation Age of Onset  . Heart disease Father   . Colon polyps Father   . Lung cancer Father     cancerous mole  . Prostate cancer Father   . Osteoarthritis Mother   . Breast cancer Mother   . Colon cancer Neg Hx   . Esophageal cancer Neg Hx   . Rectal cancer Neg Hx   . Stomach cancer Neg Hx     Reviw of Systems:  Reviewed in the HPI.  All other systems are negative.  Physical Exam: Blood pressure 122/80, pulse 64, height 5\' 7"  (1.702 m), weight 160 lb 9.6 oz (72.848 kg). Wt Readings from Last 3 Encounters:  08/28/13 160 lb 9.6 oz (72.848 kg)  08/20/13 161 lb 11.2 oz (73.347 kg)  07/23/13 158 lb 9.6 oz (71.94 kg)     General: Well developed, well nourished, in no acute distress.  Head: Normocephalic, atraumatic, sclera non-icteric, mucus membranes are moist,  Neck: Supple. Carotids are 2 + without bruits. No JVD   Lungs: Clear   Heart: RR, normal S1S2  Abdomen: Soft, non-tender, non-distended with normal bowel sounds.  Msk:  Strength and tone are normal   Extremities: No clubbing or cyanosis. No edema.  Distal pedal pulses are 2+ and equal    Neuro: CN II - XII intact.  Alert and oriented X 3.   Psych:  Normal   ECG: Aug 28, 2013:  NSR at 23.  Normal ECG  Assessment / Plan:

## 2013-08-28 NOTE — Patient Instructions (Addendum)
Your physician has requested that you have a stress echocardiogram. For further information please visit [email or web address]. Please follow instruction sheet as given.   Your physician wants you to follow-up in: 2 months after your echocardiogram.  You will receive a reminder letter in the mail two months in advance. If you don't receive a letter, please call our office to schedule the follow-up appointment.

## 2013-09-11 ENCOUNTER — Ambulatory Visit: Payer: Medicare Other | Admitting: Internal Medicine

## 2013-09-20 ENCOUNTER — Ambulatory Visit (INDEPENDENT_AMBULATORY_CARE_PROVIDER_SITE_OTHER): Payer: Medicare Other | Admitting: Internal Medicine

## 2013-09-20 ENCOUNTER — Encounter: Payer: Self-pay | Admitting: Internal Medicine

## 2013-09-20 VITALS — BP 120/70 | HR 66 | Ht 67.25 in | Wt 157.6 lb

## 2013-09-20 DIAGNOSIS — R141 Gas pain: Secondary | ICD-10-CM

## 2013-09-20 DIAGNOSIS — R143 Flatulence: Secondary | ICD-10-CM

## 2013-09-20 DIAGNOSIS — R142 Eructation: Secondary | ICD-10-CM

## 2013-09-20 DIAGNOSIS — R933 Abnormal findings on diagnostic imaging of other parts of digestive tract: Secondary | ICD-10-CM

## 2013-09-20 DIAGNOSIS — R109 Unspecified abdominal pain: Secondary | ICD-10-CM

## 2013-09-20 DIAGNOSIS — K589 Irritable bowel syndrome without diarrhea: Secondary | ICD-10-CM

## 2013-09-20 MED ORDER — HYOSCYAMINE SULFATE 0.125 MG SL SUBL: 0.1250 mg | SUBLINGUAL_TABLET | SUBLINGUAL | Status: DC | PRN

## 2013-09-20 NOTE — Progress Notes (Signed)
HISTORY OF PRESENT ILLNESS:  Roy Johnson is a 68 y.o. male with a history of prostate cancer status post prostatectomy, left hydrocele, diverticulosis complicated by diverticulitis, irritable bowel syndrome, and colon polyps. He was last evaluated December 2014 for problems with lower abdominal discomfort. See that dictation. He was felt to have spasm related to diverticulosis but not diverticulitis. Fiber supplementation recommended. His last colonoscopy was April 2013. No neoplasia. Followup in 10 years recommended. He presents today with ongoing complaints of episodic lower, pain associated with cramping, diaphoresis, nausea with occasional vomiting, and subsequent diarrhea. Problems last approximately an hour and a half then resolve. Episodes occur about every 6-8 weeks. There is no relationship to the time of day and no relationship with meals. Otherwise, he is well without complaints. His other issues belching after meals or consumption of beverages. This has been going on noticeably for one to 2 years. Finally, he has questions regarding a "lesion" on his liver from previous CT scan. I have reviewed the scans. This is a diminutive indeterminate lesion that was essentially stable over the course of one year (prior CT scan 7 mm, current CT scan 8 mm).  REVIEW OF SYSTEMS:  All non-GI ROS negative except for fatigue  Past Medical History  Diagnosis Date  . Hyperlipidemia   . Anemia   . Prostate cancer                 . Colon polyp   . Diverticulosis   . GERD (gastroesophageal reflux disease)   . Inguinal hernia   . Umbilical hernia   . Diverticulitis   . Prostate cancer   . Vitamin D deficiency     Past Surgical History  Procedure Laterality Date  . Prostatectomy    . Tonsillectomy    . Colonoscopy    . Polypectomy      Social History Roy Johnson  reports that he quit smoking about 40 years ago. He has never used smokeless tobacco. He reports that he drinks about 3.5 ounces of  alcohol per week. He reports that he does not use illicit drugs.  family history includes Breast cancer in his mother; Colon polyps in his father; Heart disease in his father; Lung cancer in his father; Osteoarthritis in his mother; Prostate cancer in his father. There is no history of Colon cancer, Esophageal cancer, Rectal cancer, or Stomach cancer.  No Known Allergies     PHYSICAL EXAMINATION: Vital signs: BP 120/70  Pulse 66  Ht 5' 7.25" (1.708 m)  Wt 157 lb 9.6 oz (71.487 kg)  BMI 24.50 kg/m2 General: Well-developed, well-nourished, no acute distress HEENT: Sclerae are anicteric, conjunctiva pink. Oral mucosa intact Lungs: Clear Heart: Regular Abdomen: soft, nontender, nondistended, no obvious ascites, no peritoneal signs, normal bowel sounds. No organomegaly. Extremities: No edema Psychiatric: alert and oriented x3. Cooperative     ASSESSMENT:  #1. Intestinal spasm associated with diverticulosis. Accompanying symptoms are that of a vagal response. #2. History of colon polyps. Last colonoscopy 2013. #3. Diverticulosis with a history of diverticulitis #4. Increased intestinal gas #5. Diminutive indeterminate hepatic lesion incidentally noted on sequential CT scans. Benign behaving. Reassurance provided #4. General medical problems   PLAN:  #1. Prescribe Levsin sublingual 0.25 mg, 1-2 sublingual every 4 hours when necessary #2. Discussion on increased intestinal gas and belching. Literature on the topic provided for his review #3. Surveillance colonoscopy around 2023 #4. Resume general medical care with Dr. Philip Aspen. GI followup as needed.

## 2013-09-20 NOTE — Patient Instructions (Signed)
We have sent the following medications to your pharmacy for you to pick up at your convenience:  Levsin  Please follow up with Dr. Henrene Pastor as needed.

## 2013-09-23 ENCOUNTER — Ambulatory Visit (HOSPITAL_COMMUNITY): Payer: Medicare Other | Attending: Cardiology

## 2013-09-23 DIAGNOSIS — R072 Precordial pain: Secondary | ICD-10-CM

## 2013-09-23 DIAGNOSIS — R0789 Other chest pain: Secondary | ICD-10-CM | POA: Insufficient documentation

## 2013-09-23 DIAGNOSIS — R0602 Shortness of breath: Secondary | ICD-10-CM

## 2013-09-23 NOTE — Progress Notes (Signed)
Stress echo performed.

## 2013-10-21 ENCOUNTER — Encounter: Payer: Self-pay | Admitting: Internal Medicine

## 2013-10-21 ENCOUNTER — Other Ambulatory Visit (HOSPITAL_BASED_OUTPATIENT_CLINIC_OR_DEPARTMENT_OTHER): Payer: Medicare Other

## 2013-10-21 ENCOUNTER — Telehealth: Payer: Self-pay | Admitting: Internal Medicine

## 2013-10-21 ENCOUNTER — Ambulatory Visit (HOSPITAL_BASED_OUTPATIENT_CLINIC_OR_DEPARTMENT_OTHER): Payer: Medicare Other | Admitting: Internal Medicine

## 2013-10-21 VITALS — BP 133/73 | HR 64 | Temp 98.0°F | Resp 19 | Ht 67.25 in | Wt 157.9 lb

## 2013-10-21 DIAGNOSIS — D473 Essential (hemorrhagic) thrombocythemia: Secondary | ICD-10-CM

## 2013-10-21 DIAGNOSIS — R5383 Other fatigue: Secondary | ICD-10-CM

## 2013-10-21 DIAGNOSIS — R5381 Other malaise: Secondary | ICD-10-CM

## 2013-10-21 LAB — COMPREHENSIVE METABOLIC PANEL (CC13)
ALBUMIN: 4 g/dL (ref 3.5–5.0)
ALT: 15 U/L (ref 0–55)
AST: 12 U/L (ref 5–34)
Alkaline Phosphatase: 58 U/L (ref 40–150)
Anion Gap: 7 mEq/L (ref 3–11)
BILIRUBIN TOTAL: 0.96 mg/dL (ref 0.20–1.20)
BUN: 20.6 mg/dL (ref 7.0–26.0)
CO2: 24 meq/L (ref 22–29)
Calcium: 9.2 mg/dL (ref 8.4–10.4)
Chloride: 110 mEq/L — ABNORMAL HIGH (ref 98–109)
Creatinine: 0.8 mg/dL (ref 0.7–1.3)
GLUCOSE: 98 mg/dL (ref 70–140)
POTASSIUM: 4.6 meq/L (ref 3.5–5.1)
SODIUM: 141 meq/L (ref 136–145)
Total Protein: 7 g/dL (ref 6.4–8.3)

## 2013-10-21 LAB — CBC WITH DIFFERENTIAL/PLATELET
BASO%: 2.4 % — ABNORMAL HIGH (ref 0.0–2.0)
Basophils Absolute: 0.1 10*3/uL (ref 0.0–0.1)
EOS ABS: 0.2 10*3/uL (ref 0.0–0.5)
EOS%: 3.5 % (ref 0.0–7.0)
HCT: 32.1 % — ABNORMAL LOW (ref 38.4–49.9)
HGB: 10.5 g/dL — ABNORMAL LOW (ref 13.0–17.1)
LYMPH#: 1.3 10*3/uL (ref 0.9–3.3)
LYMPH%: 23.3 % (ref 14.0–49.0)
MCH: 34.4 pg — ABNORMAL HIGH (ref 27.2–33.4)
MCHC: 32.7 g/dL (ref 32.0–36.0)
MCV: 105.2 fL — ABNORMAL HIGH (ref 79.3–98.0)
MONO#: 0.5 10*3/uL (ref 0.1–0.9)
MONO%: 9 % (ref 0.0–14.0)
NEUT%: 61.8 % (ref 39.0–75.0)
NEUTROS ABS: 3.5 10*3/uL (ref 1.5–6.5)
Platelets: 597 10*3/uL — ABNORMAL HIGH (ref 140–400)
RBC: 3.05 10*6/uL — AB (ref 4.20–5.82)
RDW: 28.7 % — AB (ref 11.0–14.6)
WBC: 5.6 10*3/uL (ref 4.0–10.3)

## 2013-10-21 LAB — LACTATE DEHYDROGENASE (CC13): LDH: 166 U/L (ref 125–245)

## 2013-10-21 NOTE — Telephone Encounter (Signed)
gv and printed appt sched and avs for pt for Aug °

## 2013-10-21 NOTE — Progress Notes (Signed)
[facility]  Telephone:([phone] Fax:(336) [phone] OFFICE PROGRESS NOTE  Roy Lopes, MD [place] [address]  DIAGNOSIS: 1.  Essential thrombocythemia with positive JAK2 mutation 2. Leukocytosis.   PRIOR THERAPY: None.  CURRENT THERAPY: Hydrea 500 mg by mouth once daily. This would be changing to 500 mg alternating with 1000 mg every other day starting today 10/21/2013.  INTERVAL HISTORY: Roy Johnson 68 y.o. male returns for a scheduled regular office visit for follow up. The patient is doing well today except for persistent fatigue. He denied having any significant bleeding, bruises or ecchymosis. He is tolerating his treatment with hydroxyurea fairly well with no significant adverse effects except for fatigue. The patient denied having any significant fever or chills. He has no nausea or vomiting. He has no significant weight loss or night sweats. He has repeat CBC performed earlier today and he is here for evaluation and discussion of his lab results.  MEDICAL HISTORY: Past Medical History  Diagnosis Date  . Hyperlipidemia   . Anemia   . Prostate cancer                 . Colon polyp   . Diverticulosis   . GERD (gastroesophageal reflux disease)   . Inguinal hernia   . Umbilical hernia   . Diverticulitis   . Prostate cancer   . Vitamin D deficiency     ALLERGIES:  has No Known Allergies.  MEDICATIONS:  Current Outpatient Prescriptions  Medication Sig Dispense Refill  . ferrous sulfate 325 (65 FE) MG tablet Take 325 mg by mouth daily with breakfast.      . hydroxyurea (HYDREA) 500 MG capsule Take 500 mg by mouth daily. Or as directed by MD.  May take with food to minimize GI side effects.      . hyoscyamine (LEVSIN SL) 0.125 MG SL tablet Place 1 tablet (0.125 mg total) under the tongue every 4 (four) hours as needed.  30 tablet  3   No current facility-administered medications for this visit.    SURGICAL HISTORY:  Past  Surgical History  Procedure Laterality Date  . Prostatectomy    . Tonsillectomy    . Colonoscopy    . Polypectomy      REVIEW OF SYSTEMS:  A comprehensive review of systems was negative except for: Constitutional: positive for fatigue   PHYSICAL EXAMINATION: General appearance: alert, cooperative, appears stated age and no distress Head: Normocephalic, without obvious abnormality, atraumatic Neck: no adenopathy, no carotid bruit, no JVD, supple, symmetrical, trachea midline and thyroid not enlarged, symmetric, no tenderness/mass/nodules Lymph nodes: Cervical, supraclavicular, and axillary nodes normal. Resp: clear to auscultation bilaterally Back: symmetric, no curvature. ROM normal. No CVA tenderness. Cardio: regular rate and rhythm, S1, S2 normal, no murmur, click, rub or gallop GI: mild left lower quadrant tenderness without rebound or referred pain, remainder the abdomen is soft nontender no appreciable organomegaly, bowel sounds present and normally active in all quadrants Extremities: extremities normal, atraumatic, no cyanosis or edema Neurologic: Alert and oriented X 3, normal strength and tone. Normal symmetric reflexes. Normal coordination and gait  ECOG PERFORMANCE STATUS: 1 - Symptomatic but completely ambulatory  Blood pressure 133/73, pulse 64, temperature 98 F (36.7 C), temperature source Oral, resp. rate 19, height 5' 7.25" (1.708 m), weight 157 lb 14.4 oz (71.623 kg).  LABORATORY DATA: Lab Results  Component Value Date   WBC 5.6 10/21/2013   HGB 10.5* 10/21/2013   HCT 32.1* 10/21/2013  MCV 105.2* 10/21/2013   PLT 597* 10/21/2013      Chemistry      Component Value Date/Time   NA 141 10/21/2013 0929   K 4.6 10/21/2013 0929   CO2 24 10/21/2013 0929   BUN 20.6 10/21/2013 0929   CREATININE 0.8 10/21/2013 0929      Component Value Date/Time   CALCIUM 9.2 10/21/2013 0929   ALKPHOS 58 10/21/2013 0929   AST 12 10/21/2013 0929   ALT 15 10/21/2013 0929   BILITOT 0.96  10/21/2013 0929       RADIOGRAPHIC STUDIES:  ASSESSMENT AND PLAN:  This is a very pleasant 68 years old recently diagnosed with essential thrombocythemia with positive JAK-2 mutation.  The patient is currently on treatment with hydroxyurea 500 mg by mouth once a day and but his platelets count increased to 597,000. I recommended for him to continue his current treatment with hydroxyurea but I will change the dose to 500 mg alternating by a 1000 mg every other day. I will continue to monitor his CBC, comprehensive metabolic panel and LDH closely every three-weeks. He would come back for followup visit in 6 weeks. He was advised to call immediately if he has any concerning symptoms in the interval.  All questions were answered. The patient knows to call the clinic with any problems, questions or concerns. We can certainly see the patient much sooner if necessary.  Disclaimer: This note was dictated with voice recognition software. Similar sounding words can inadvertently be transcribed and may not be corrected upon review.

## 2013-11-12 ENCOUNTER — Other Ambulatory Visit: Payer: Self-pay | Admitting: Internal Medicine

## 2013-11-12 ENCOUNTER — Other Ambulatory Visit (HOSPITAL_BASED_OUTPATIENT_CLINIC_OR_DEPARTMENT_OTHER): Payer: Medicare Other

## 2013-11-12 DIAGNOSIS — D473 Essential (hemorrhagic) thrombocythemia: Secondary | ICD-10-CM

## 2013-11-12 LAB — COMPREHENSIVE METABOLIC PANEL (CC13)
ALK PHOS: 60 U/L (ref 40–150)
ALT: 12 U/L (ref 0–55)
AST: 15 U/L (ref 5–34)
Albumin: 4 g/dL (ref 3.5–5.0)
Anion Gap: 5 mEq/L (ref 3–11)
BILIRUBIN TOTAL: 0.92 mg/dL (ref 0.20–1.20)
BUN: 20.9 mg/dL (ref 7.0–26.0)
CO2: 28 mEq/L (ref 22–29)
Calcium: 9.3 mg/dL (ref 8.4–10.4)
Chloride: 108 mEq/L (ref 98–109)
Creatinine: 0.8 mg/dL (ref 0.7–1.3)
Glucose: 97 mg/dl (ref 70–140)
Potassium: 4.6 mEq/L (ref 3.5–5.1)
Sodium: 141 mEq/L (ref 136–145)
TOTAL PROTEIN: 7 g/dL (ref 6.4–8.3)

## 2013-11-12 LAB — CBC WITH DIFFERENTIAL/PLATELET
BASO%: 2.3 % — AB (ref 0.0–2.0)
Basophils Absolute: 0.1 10*3/uL (ref 0.0–0.1)
EOS%: 3.1 % (ref 0.0–7.0)
Eosinophils Absolute: 0.1 10*3/uL (ref 0.0–0.5)
HCT: 29.2 % — ABNORMAL LOW (ref 38.4–49.9)
HGB: 9.6 g/dL — ABNORMAL LOW (ref 13.0–17.1)
LYMPH%: 26.7 % (ref 14.0–49.0)
MCH: 34.2 pg — ABNORMAL HIGH (ref 27.2–33.4)
MCHC: 32.9 g/dL (ref 32.0–36.0)
MCV: 104 fL — ABNORMAL HIGH (ref 79.3–98.0)
MONO#: 0.5 10*3/uL (ref 0.1–0.9)
MONO%: 9.7 % (ref 0.0–14.0)
NEUT#: 2.7 10*3/uL (ref 1.5–6.5)
NEUT%: 58.2 % (ref 39.0–75.0)
PLATELETS: 512 10*3/uL — AB (ref 140–400)
RBC: 2.81 10*6/uL — AB (ref 4.20–5.82)
RDW: 29.4 % — ABNORMAL HIGH (ref 11.0–14.6)
WBC: 4.6 10*3/uL (ref 4.0–10.3)
lymph#: 1.2 10*3/uL (ref 0.9–3.3)

## 2013-11-12 LAB — LACTATE DEHYDROGENASE (CC13): LDH: 171 U/L (ref 125–245)

## 2013-11-12 MED ORDER — HYDROXYUREA 500 MG PO CAPS: 500.0000 mg | ORAL_CAPSULE | Freq: Every day | ORAL | Status: DC

## 2013-11-15 ENCOUNTER — Telehealth: Payer: Self-pay | Admitting: Medical Oncology

## 2013-11-15 ENCOUNTER — Encounter: Payer: Self-pay | Admitting: Internal Medicine

## 2013-11-15 NOTE — Telephone Encounter (Signed)
Message left about labs .

## 2013-11-25 ENCOUNTER — Ambulatory Visit (INDEPENDENT_AMBULATORY_CARE_PROVIDER_SITE_OTHER): Payer: Medicare Other | Admitting: Cardiovascular Disease

## 2013-11-25 ENCOUNTER — Encounter: Payer: Self-pay | Admitting: Cardiovascular Disease

## 2013-11-25 VITALS — BP 122/66 | HR 71 | Ht 67.25 in | Wt 161.8 lb

## 2013-11-25 DIAGNOSIS — R0789 Other chest pain: Secondary | ICD-10-CM

## 2013-11-25 NOTE — Progress Notes (Signed)
Roy Johnson Date of Birth  [birth year]       [facility]    [employer] [address], [place], [place]  [address]   [place], [place]  [address] [place]   Fax  [phone]     Fax [phone]  Problem List: 1. Dyspnea 2. Essential thrombocythemia with positive JAK2 mutation 3. Anemia 4. Prostate cancer - s/p prostotectomy  History of Present Illness:  Roy Johnson is a 68 yo with hx of thrombcytosis.  He has had anemia for many years.   He has had some weakness.  He was doing some yard work ( digging a hole to plant a bush)  several months ago ( in [place]) and had severe chest pain/ chest pressure.    This was associated with dyspnea/ diaphoresis.    This discomfort lasted 1-2 minutes.    He did not go to the doctor.  The pain has not recurred to that degree.   He has been on Urea and thought that may be the cause.   He exercised regularly - 1 1/2  hours of exercise bike / ellipitical / walking.   Does not have any problems with his workouts.    He is retired from [employer].    November 25, 2013:    [name] is doing well.  His stress echo was normal. He has some fatigue and attributes his fatigue to his medications for his other medical problems.      Current Outpatient Prescriptions on File Prior to Visit  Medication Sig Dispense Refill  . ferrous sulfate 325 (65 FE) MG tablet Take 325 mg by mouth daily with breakfast.      . hydroxyurea (HYDREA) 500 MG capsule Take 1 capsule (500 mg total) by mouth daily. Take 500mg  daily, alternating with 1000mg  every other day.  May take with food.  90 capsule  1  . hyoscyamine (LEVSIN SL) 0.125 MG SL tablet Place 1 tablet (0.125 mg total) under the tongue every 4 (four) hours as needed.  30 tablet  3   No current facility-administered medications on file prior to visit.   : No Known Allergies  Past Medical History  Diagnosis Date  . Hyperlipidemia   .  Anemia   . Prostate cancer                 . Colon polyp   . Diverticulosis   . GERD (gastroesophageal reflux disease)   . Inguinal hernia   . Umbilical hernia   . Diverticulitis   . Prostate cancer   . Vitamin D deficiency     Past Surgical History  Procedure Laterality Date  . Prostatectomy    . Tonsillectomy    . Colonoscopy    . Polypectomy      History  Smoking status  . Former Smoker  . Quit date: 09/07/1973  Smokeless tobacco  . Never Used    History  Alcohol Use  . 3.5 oz/week  . 7 drink(s) per week    Family History  Problem Relation Age of Onset  . Heart disease Father   . Colon polyps Father   . Lung cancer Father     cancerous mole  . Prostate cancer Father   . Osteoarthritis Mother   . Breast cancer Mother   . Colon cancer Neg Hx   . Esophageal cancer Neg Hx   . Rectal cancer Neg Hx   .  Stomach cancer Neg Hx     Reviw of Systems:  Reviewed in the HPI.  All other systems are negative.  Physical Exam: Blood pressure 122/66, pulse 71, height 5' 7.25" (1.708 m), weight 161 lb 12.8 oz (73.392 kg). Wt Readings from Last 3 Encounters:  11/25/13 161 lb 12.8 oz (73.392 kg)  10/21/13 157 lb 14.4 oz (71.623 kg)  09/20/13 157 lb 9.6 oz (71.487 kg)     General: Well developed, well nourished, in no acute distress.  Head: Normocephalic, atraumatic, sclera non-icteric, mucus membranes are moist,   Neck: Supple. Carotids are 2 + without bruits. No JVD   Lungs: Clear   Heart: RR, normal S1S2  Abdomen: Soft, non-tender, non-distended with normal bowel sounds.  Msk:  Strength and tone are normal   Extremities: No clubbing or cyanosis. No edema.  Distal pedal pulses are 2+ and equal    Neuro: CN II - XII intact.  Alert and oriented X 3.   Psych:  Normal   ECG:   Assessment / Plan:

## 2013-11-25 NOTE — Assessment & Plan Note (Signed)
Dyson seems to be doing well. He's not had any recent episodes of chest discomfort. He does have some shortness breath and fatigue but this seems to be related to the hydroxyurea therapy. A stress echo was normal.  He is able to work out for 1 1/2 hours a day without problems.  He should continue to exercise regularly.    I'll see him on an as-needed basis.

## 2013-11-25 NOTE — Patient Instructions (Signed)
Your physician recommends that you continue on your current medications as directed. Please refer to the Current Medication list given to you today.  Your physician recommends that you schedule a follow-up appointment as needed with Dr. Nahser.  

## 2013-12-03 ENCOUNTER — Other Ambulatory Visit (HOSPITAL_BASED_OUTPATIENT_CLINIC_OR_DEPARTMENT_OTHER): Payer: Medicare Other

## 2013-12-03 ENCOUNTER — Telehealth: Payer: Self-pay | Admitting: Internal Medicine

## 2013-12-03 ENCOUNTER — Encounter: Payer: Self-pay | Admitting: Internal Medicine

## 2013-12-03 ENCOUNTER — Ambulatory Visit (HOSPITAL_BASED_OUTPATIENT_CLINIC_OR_DEPARTMENT_OTHER): Payer: Medicare Other | Admitting: Internal Medicine

## 2013-12-03 VITALS — BP 127/66 | HR 67 | Temp 98.0°F | Resp 19 | Ht 67.25 in | Wt 160.5 lb

## 2013-12-03 DIAGNOSIS — D473 Essential (hemorrhagic) thrombocythemia: Secondary | ICD-10-CM

## 2013-12-03 DIAGNOSIS — D72829 Elevated white blood cell count, unspecified: Secondary | ICD-10-CM

## 2013-12-03 LAB — COMPREHENSIVE METABOLIC PANEL (CC13)
ALT: 13 U/L (ref 0–55)
AST: 15 U/L (ref 5–34)
Albumin: 4 g/dL (ref 3.5–5.0)
Alkaline Phosphatase: 66 U/L (ref 40–150)
Anion Gap: 7 mEq/L (ref 3–11)
BUN: 23.1 mg/dL (ref 7.0–26.0)
CALCIUM: 9 mg/dL (ref 8.4–10.4)
CHLORIDE: 109 meq/L (ref 98–109)
CO2: 24 mEq/L (ref 22–29)
Creatinine: 0.7 mg/dL (ref 0.7–1.3)
GLUCOSE: 97 mg/dL (ref 70–140)
Potassium: 4.5 mEq/L (ref 3.5–5.1)
Sodium: 140 mEq/L (ref 136–145)
Total Bilirubin: 0.73 mg/dL (ref 0.20–1.20)
Total Protein: 7 g/dL (ref 6.4–8.3)

## 2013-12-03 LAB — CBC WITH DIFFERENTIAL/PLATELET
BASO%: 2.5 % — ABNORMAL HIGH (ref 0.0–2.0)
BASOS ABS: 0.1 10*3/uL (ref 0.0–0.1)
EOS%: 3.2 % (ref 0.0–7.0)
Eosinophils Absolute: 0.2 10*3/uL (ref 0.0–0.5)
HEMATOCRIT: 29.4 % — AB (ref 38.4–49.9)
HEMOGLOBIN: 9.6 g/dL — AB (ref 13.0–17.1)
LYMPH#: 1.2 10*3/uL (ref 0.9–3.3)
LYMPH%: 25.4 % (ref 14.0–49.0)
MCH: 34.7 pg — AB (ref 27.2–33.4)
MCHC: 32.8 g/dL (ref 32.0–36.0)
MCV: 105.9 fL — ABNORMAL HIGH (ref 79.3–98.0)
MONO#: 0.6 10*3/uL (ref 0.1–0.9)
MONO%: 12.3 % (ref 0.0–14.0)
NEUT#: 2.7 10*3/uL (ref 1.5–6.5)
NEUT%: 56.6 % (ref 39.0–75.0)
Platelets: 501 10*3/uL — ABNORMAL HIGH (ref 140–400)
RBC: 2.77 10*6/uL — ABNORMAL LOW (ref 4.20–5.82)
RDW: 30 % — ABNORMAL HIGH (ref 11.0–14.6)
WBC: 4.7 10*3/uL (ref 4.0–10.3)

## 2013-12-03 LAB — LACTATE DEHYDROGENASE (CC13): LDH: 174 U/L (ref 125–245)

## 2013-12-03 MED ORDER — ANAGRELIDE HCL 0.5 MG PO CAPS: 0.5000 mg | ORAL_CAPSULE | Freq: Every day | ORAL | Status: DC

## 2013-12-03 NOTE — Progress Notes (Signed)
[facility]  Telephone:([phone] [phone] OFFICE PROGRESS NOTE  Roy Lopes, MD [place] [address]  DIAGNOSIS: 1.  Essential thrombocythemia with positive JAK2 mutation 2. Leukocytosis.   PRIOR THERAPY: None.  CURRENT THERAPY: Hydrea 500 mg by mouth once daily. This would be changing to 500 mg alternating with 1000 mg every other day starting today 10/21/2013.  INTERVAL HISTORY: Roy Johnson 68 y.o. male returns for a scheduled regular office visit for follow up. The patient is doing well today except for persistent fatigue and lack of concentration. These symptoms gets worse when he takes the higher dose of hydroxyurea. He denied having any significant bleeding, bruises or ecchymosis. The patient denied having any significant fever or chills. He has no nausea or vomiting. He has no significant weight loss or night sweats. He has repeat CBC performed earlier today and he is here for evaluation and discussion of his lab results.  MEDICAL HISTORY: Past Medical History  Diagnosis Date  . Hyperlipidemia   . Anemia   . Prostate cancer                 . Colon polyp   . Diverticulosis   . GERD (gastroesophageal reflux disease)   . Inguinal hernia   . Umbilical hernia   . Diverticulitis   . Prostate cancer   . Vitamin D deficiency     ALLERGIES:  has No Known Allergies.  MEDICATIONS:  Current Outpatient Prescriptions  Medication Sig Dispense Refill  . ferrous sulfate 325 (65 FE) MG tablet Take 325 mg by mouth daily with breakfast.      . hydroxyurea (HYDREA) 500 MG capsule Take 1 capsule (500 mg total) by mouth daily. Take 500mg  daily, alternating with 1000mg  every other day.  May take with food.  90 capsule  1  . hyoscyamine (LEVSIN SL) 0.125 MG SL tablet Place 1 tablet (0.125 mg total) under the tongue every 4 (four) hours as needed.  30 tablet  3   No current facility-administered medications for this visit.     SURGICAL HISTORY:  Past Surgical History  Procedure Laterality Date  . Prostatectomy    . Tonsillectomy    . Colonoscopy    . Polypectomy      REVIEW OF SYSTEMS:  A comprehensive review of systems was negative except for: Constitutional: positive for fatigue   PHYSICAL EXAMINATION: General appearance: alert, cooperative, appears stated age and no distress Head: Normocephalic, without obvious abnormality, atraumatic Neck: no adenopathy, no carotid bruit, no JVD, supple, symmetrical, trachea midline and thyroid not enlarged, symmetric, no tenderness/mass/nodules Lymph nodes: Cervical, supraclavicular, and axillary nodes normal. Resp: clear to auscultation bilaterally Back: symmetric, no curvature. ROM normal. No CVA tenderness. Cardio: regular rate and rhythm, S1, S2 normal, no murmur, click, rub or gallop GI: mild left lower quadrant tenderness without rebound or referred pain, remainder the abdomen is soft nontender no appreciable organomegaly, bowel sounds present and normally active in all quadrants Extremities: extremities normal, atraumatic, no cyanosis or edema Neurologic: Alert and oriented X 3, normal strength and tone. Normal symmetric reflexes. Normal coordination and gait  ECOG PERFORMANCE STATUS: 1 - Symptomatic but completely ambulatory  Blood pressure 127/66, pulse 67, temperature 98 F (36.7 C), temperature source Oral, resp. rate 19, height 5' 7.25" (1.708 m), weight 160 lb 8 oz (72.802 kg).  LABORATORY DATA: Lab Results  Component Value Date   WBC 4.7 12/03/2013   HGB 9.6* 12/03/2013   HCT 29.4* 12/03/2013  MCV 105.9* 12/03/2013   PLT 501* 12/03/2013      Chemistry      Component Value Date/Time   NA 141 11/12/2013 0832   K 4.6 11/12/2013 0832   CO2 28 11/12/2013 0832   BUN 20.9 11/12/2013 0832   CREATININE 0.8 11/12/2013 0832      Component Value Date/Time   CALCIUM 9.3 11/12/2013 0832   ALKPHOS 60 11/12/2013 0832   AST 15 11/12/2013 0832   ALT 12 11/12/2013 0832    BILITOT 0.92 11/12/2013 0832       RADIOGRAPHIC STUDIES:  ASSESSMENT AND PLAN:  This is a very pleasant 68 years old recently diagnosed with essential thrombocythemia with positive JAK-2 mutation.  The patient is currently on treatment with hydroxyurea 500 mg alternating with 1000 mg every other day. His platelets count down to 501,000. I had a lengthy discussion with the patient today about his lab results. He is concerned about the higher dose of hydroxyurea. I recommended for him to go back to hydroxyurea 500 mg by mouth daily but I would add anagrelide 0.5 mg daily. He would come back for followup visit in one month's for reevaluation with repeat blood work. He was advised to call immediately if he has any concerning symptoms in the interval.  All questions were answered. The patient knows to call the clinic with any problems, questions or concerns. We can certainly see the patient much sooner if necessary.  Disclaimer: This note was dictated with voice recognition software. Similar sounding words can inadvertently be transcribed and may not be corrected upon review.

## 2013-12-03 NOTE — Telephone Encounter (Signed)
Pt confirmed labs/ov per 08/25 POF, gave pt AVS....KJ °

## 2014-01-09 ENCOUNTER — Telehealth: Payer: Self-pay | Admitting: Internal Medicine

## 2014-01-09 ENCOUNTER — Other Ambulatory Visit (HOSPITAL_BASED_OUTPATIENT_CLINIC_OR_DEPARTMENT_OTHER): Payer: Medicare Other

## 2014-01-09 ENCOUNTER — Ambulatory Visit (HOSPITAL_BASED_OUTPATIENT_CLINIC_OR_DEPARTMENT_OTHER): Payer: Medicare Other | Admitting: Internal Medicine

## 2014-01-09 VITALS — BP 141/78 | HR 67 | Temp 98.1°F | Resp 20 | Ht 67.25 in | Wt 158.3 lb

## 2014-01-09 DIAGNOSIS — D693 Immune thrombocytopenic purpura: Secondary | ICD-10-CM

## 2014-01-09 DIAGNOSIS — D473 Essential (hemorrhagic) thrombocythemia: Secondary | ICD-10-CM

## 2014-01-09 LAB — COMPREHENSIVE METABOLIC PANEL (CC13)
ALT: 14 U/L (ref 0–55)
ANION GAP: 5 meq/L (ref 3–11)
AST: 13 U/L (ref 5–34)
Albumin: 3.9 g/dL (ref 3.5–5.0)
Alkaline Phosphatase: 56 U/L (ref 40–150)
BUN: 22.2 mg/dL (ref 7.0–26.0)
CALCIUM: 9.7 mg/dL (ref 8.4–10.4)
CHLORIDE: 111 meq/L — AB (ref 98–109)
CO2: 27 meq/L (ref 22–29)
Creatinine: 0.8 mg/dL (ref 0.7–1.3)
Glucose: 93 mg/dl (ref 70–140)
Potassium: 4.8 mEq/L (ref 3.5–5.1)
SODIUM: 143 meq/L (ref 136–145)
TOTAL PROTEIN: 7 g/dL (ref 6.4–8.3)
Total Bilirubin: 1.06 mg/dL (ref 0.20–1.20)

## 2014-01-09 LAB — CBC WITH DIFFERENTIAL/PLATELET
BASO%: 0.7 % (ref 0.0–2.0)
Basophils Absolute: 0 10*3/uL (ref 0.0–0.1)
EOS ABS: 0.1 10*3/uL (ref 0.0–0.5)
EOS%: 2.2 % (ref 0.0–7.0)
HCT: 30.1 % — ABNORMAL LOW (ref 38.4–49.9)
HGB: 9.9 g/dL — ABNORMAL LOW (ref 13.0–17.1)
LYMPH#: 1.5 10*3/uL (ref 0.9–3.3)
LYMPH%: 25.8 % (ref 14.0–49.0)
MCH: 33.3 pg (ref 27.2–33.4)
MCHC: 32.9 g/dL (ref 32.0–36.0)
MCV: 101.3 fL — ABNORMAL HIGH (ref 79.3–98.0)
MONO#: 0.6 10*3/uL (ref 0.1–0.9)
MONO%: 9.7 % (ref 0.0–14.0)
NEUT%: 61.6 % (ref 39.0–75.0)
NEUTROS ABS: 3.6 10*3/uL (ref 1.5–6.5)
Platelets: 456 10*3/uL — ABNORMAL HIGH (ref 140–400)
RBC: 2.97 10*6/uL — AB (ref 4.20–5.82)
RDW: 29.2 % — AB (ref 11.0–14.6)
WBC: 5.8 10*3/uL (ref 4.0–10.3)
nRBC: 0 % (ref 0–0)

## 2014-01-09 LAB — LACTATE DEHYDROGENASE (CC13): LDH: 190 U/L (ref 125–245)

## 2014-01-09 NOTE — Telephone Encounter (Signed)
gv and printed appt sched and avs for pt fro NOV thru Jan 2016

## 2014-01-09 NOTE — Progress Notes (Signed)
[facility]  Telephone:(336) [phone] Fax:([phone] OFFICE PROGRESS NOTE  [name], MD [place] [address]  DIAGNOSIS: 1.  Essential thrombocythemia with positive JAK2 mutation 2. Leukocytosis.   PRIOR THERAPY: None.  CURRENT THERAPY: Hydrea 500 mg by mouth once daily and anagrelide 0.5 mg by mouth daily.   INTERVAL HISTORY: Roy Johnson 68 y.o. male returns for a scheduled regular office visit for follow up. The patient is doing well today except for persistent fatigue. He exercises several times a weekly. He was started last month on anagrelide 0.5 mg by mouth daily in addition to Hydrea 500 mg by mouth daily. He is tolerating this treatment well with no significant adverse effects. He denied having any significant bleeding, bruises or ecchymosis. The patient denied having any significant fever or chills. He has no nausea or vomiting. He has no significant weight loss or night sweats. He has repeat CBC performed earlier today and he is here for evaluation and discussion of his lab results.  MEDICAL HISTORY: Past Medical History  Diagnosis Date  . Hyperlipidemia   . Anemia   . Prostate cancer                 . Colon polyp   . Diverticulosis   . GERD (gastroesophageal reflux disease)   . Inguinal hernia   . Umbilical hernia   . Diverticulitis   . Prostate cancer   . Vitamin D deficiency     ALLERGIES:  has No Known Allergies.  MEDICATIONS:  Current Outpatient Prescriptions  Medication Sig Dispense Refill  . anagrelide (AGRYLIN) 0.5 MG capsule Take 1 capsule (0.5 mg total) by mouth daily.  30 capsule  2  . ferrous sulfate 325 (65 FE) MG tablet Take 325 mg by mouth daily with breakfast.      . hydroxyurea (HYDREA) 500 MG capsule Take 1 capsule (500 mg total) by mouth daily. Take 500mg  daily, alternating with 1000mg  every other day.  May take with food.  90 capsule  1  . hyoscyamine (LEVSIN SL) 0.125 MG SL tablet Place 1  tablet (0.125 mg total) under the tongue every 4 (four) hours as needed.  30 tablet  3   No current facility-administered medications for this visit.    SURGICAL HISTORY:  Past Surgical History  Procedure Laterality Date  . Prostatectomy    . Tonsillectomy    . Colonoscopy    . Polypectomy      REVIEW OF SYSTEMS:  A comprehensive review of systems was negative except for: Constitutional: positive for fatigue   PHYSICAL EXAMINATION: General appearance: alert, cooperative, appears stated age and no distress Head: Normocephalic, without obvious abnormality, atraumatic Neck: no adenopathy, no carotid bruit, no JVD, supple, symmetrical, trachea midline and thyroid not enlarged, symmetric, no tenderness/mass/nodules Lymph nodes: Cervical, supraclavicular, and axillary nodes normal. Resp: clear to auscultation bilaterally Back: symmetric, no curvature. ROM normal. No CVA tenderness. Cardio: regular rate and rhythm, S1, S2 normal, no murmur, click, rub or gallop GI: mild left lower quadrant tenderness without rebound or referred pain, remainder the abdomen is soft nontender no appreciable organomegaly, bowel sounds present and normally active in all quadrants Extremities: extremities normal, atraumatic, no cyanosis or edema Neurologic: Alert and oriented X 3, normal strength and tone. Normal symmetric reflexes. Normal coordination and gait  ECOG PERFORMANCE STATUS: 1 - Symptomatic but completely ambulatory  Blood pressure 141/78, pulse 67, temperature 98.1 F (36.7 C), temperature source Oral, resp. rate 20, height 5'  7.25" (1.708 m), weight 158 lb 4.8 oz (71.804 kg).  LABORATORY DATA: Lab Results  Component Value Date   WBC 5.8 01/09/2014   HGB 9.9* 01/09/2014   HCT 30.1* 01/09/2014   MCV 101.3* 01/09/2014   PLT 456* 01/09/2014      Chemistry      Component Value Date/Time   NA 143 01/09/2014 0909   K 4.8 01/09/2014 0909   CO2 27 01/09/2014 0909   BUN 22.2 01/09/2014 0909    CREATININE 0.8 01/09/2014 0909      Component Value Date/Time   CALCIUM 9.7 01/09/2014 0909   ALKPHOS 56 01/09/2014 0909   AST 13 01/09/2014 0909   ALT 14 01/09/2014 0909   BILITOT 1.06 01/09/2014 0909       RADIOGRAPHIC STUDIES:  ASSESSMENT AND PLAN:  This is a very pleasant 68 years old recently diagnosed with essential thrombocythemia with positive JAK-2 mutation.  The patient is currently on treatment with hydroxyurea 500 mg alternating with 1000 mg every other day. His platelets count down to 456,000 I recommended for him to continue treatment with hydroxyurea 500 mg by mouth daily but I would add anagrelide 0.5 mg daily. He would come back for followup visit in 3 month for reevaluation with repeat blood work on a monthly basis. He was advised to call immediately if he has any concerning symptoms in the interval.  All questions were answered. The patient knows to call the clinic with any problems, questions or concerns. We can certainly see the patient much sooner if necessary.  Disclaimer: This note was dictated with voice recognition software. Similar sounding words can inadvertently be transcribed and may not be corrected upon review.

## 2014-02-13 ENCOUNTER — Other Ambulatory Visit (HOSPITAL_BASED_OUTPATIENT_CLINIC_OR_DEPARTMENT_OTHER): Payer: Medicare Other

## 2014-02-13 DIAGNOSIS — D693 Immune thrombocytopenic purpura: Secondary | ICD-10-CM

## 2014-02-13 DIAGNOSIS — D473 Essential (hemorrhagic) thrombocythemia: Secondary | ICD-10-CM

## 2014-02-13 LAB — CBC WITH DIFFERENTIAL/PLATELET
BASO%: 1.9 % (ref 0.0–2.0)
BASOS ABS: 0.1 10*3/uL (ref 0.0–0.1)
EOS%: 3 % (ref 0.0–7.0)
Eosinophils Absolute: 0.2 10*3/uL (ref 0.0–0.5)
HEMATOCRIT: 31.4 % — AB (ref 38.4–49.9)
HEMOGLOBIN: 10.2 g/dL — AB (ref 13.0–17.1)
LYMPH#: 1.6 10*3/uL (ref 0.9–3.3)
LYMPH%: 26.5 % (ref 14.0–49.0)
MCH: 34.3 pg — ABNORMAL HIGH (ref 27.2–33.4)
MCHC: 32.4 g/dL (ref 32.0–36.0)
MCV: 105.9 fL — ABNORMAL HIGH (ref 79.3–98.0)
MONO#: 0.6 10*3/uL (ref 0.1–0.9)
MONO%: 10.4 % (ref 0.0–14.0)
NEUT#: 3.4 10*3/uL (ref 1.5–6.5)
NEUT%: 58.2 % (ref 39.0–75.0)
Platelets: 488 10*3/uL — ABNORMAL HIGH (ref 140–400)
RBC: 2.96 10*6/uL — ABNORMAL LOW (ref 4.20–5.82)
RDW: 29.6 % — ABNORMAL HIGH (ref 11.0–14.6)
WBC: 5.9 10*3/uL (ref 4.0–10.3)

## 2014-02-13 LAB — COMPREHENSIVE METABOLIC PANEL (CC13)
ALT: 14 U/L (ref 0–55)
ANION GAP: 6 meq/L (ref 3–11)
AST: 13 U/L (ref 5–34)
Albumin: 4 g/dL (ref 3.5–5.0)
Alkaline Phosphatase: 60 U/L (ref 40–150)
BUN: 21.6 mg/dL (ref 7.0–26.0)
CALCIUM: 9.4 mg/dL (ref 8.4–10.4)
CHLORIDE: 109 meq/L (ref 98–109)
CO2: 26 mEq/L (ref 22–29)
CREATININE: 1 mg/dL (ref 0.7–1.3)
Glucose: 102 mg/dl (ref 70–140)
Potassium: 4.9 mEq/L (ref 3.5–5.1)
Sodium: 140 mEq/L (ref 136–145)
Total Bilirubin: 0.87 mg/dL (ref 0.20–1.20)
Total Protein: 7.1 g/dL (ref 6.4–8.3)

## 2014-03-05 ENCOUNTER — Encounter: Payer: Self-pay | Admitting: Internal Medicine

## 2014-03-05 ENCOUNTER — Other Ambulatory Visit: Payer: Self-pay | Admitting: *Deleted

## 2014-03-05 DIAGNOSIS — D693 Immune thrombocytopenic purpura: Secondary | ICD-10-CM

## 2014-03-05 MED ORDER — ANAGRELIDE HCL 0.5 MG PO CAPS: 0.5000 mg | ORAL_CAPSULE | Freq: Every day | ORAL | Status: DC

## 2014-03-13 ENCOUNTER — Other Ambulatory Visit (HOSPITAL_BASED_OUTPATIENT_CLINIC_OR_DEPARTMENT_OTHER): Payer: Medicare Other

## 2014-03-13 DIAGNOSIS — D473 Essential (hemorrhagic) thrombocythemia: Secondary | ICD-10-CM

## 2014-03-13 DIAGNOSIS — D693 Immune thrombocytopenic purpura: Secondary | ICD-10-CM

## 2014-03-13 LAB — CBC WITH DIFFERENTIAL/PLATELET
BASO%: 2.1 % — ABNORMAL HIGH (ref 0.0–2.0)
Basophils Absolute: 0.1 10*3/uL (ref 0.0–0.1)
EOS%: 1.9 % (ref 0.0–7.0)
Eosinophils Absolute: 0.1 10*3/uL (ref 0.0–0.5)
HCT: 31.6 % — ABNORMAL LOW (ref 38.4–49.9)
HEMOGLOBIN: 10 g/dL — AB (ref 13.0–17.1)
LYMPH#: 1.8 10*3/uL (ref 0.9–3.3)
LYMPH%: 27.3 % (ref 14.0–49.0)
MCH: 33.7 pg — AB (ref 27.2–33.4)
MCHC: 31.8 g/dL — ABNORMAL LOW (ref 32.0–36.0)
MCV: 105.8 fL — ABNORMAL HIGH (ref 79.3–98.0)
MONO#: 0.6 10*3/uL (ref 0.1–0.9)
MONO%: 9.8 % (ref 0.0–14.0)
NEUT#: 3.8 10*3/uL (ref 1.5–6.5)
NEUT%: 58.9 % (ref 39.0–75.0)
Platelets: 542 10*3/uL — ABNORMAL HIGH (ref 140–400)
RBC: 2.98 10*6/uL — AB (ref 4.20–5.82)
RDW: 29.9 % — AB (ref 11.0–14.6)
WBC: 6.5 10*3/uL (ref 4.0–10.3)

## 2014-03-13 LAB — COMPREHENSIVE METABOLIC PANEL (CC13)
ALBUMIN: 4 g/dL (ref 3.5–5.0)
ALT: 15 U/L (ref 0–55)
AST: 15 U/L (ref 5–34)
Alkaline Phosphatase: 54 U/L (ref 40–150)
Anion Gap: 7 mEq/L (ref 3–11)
BUN: 17.5 mg/dL (ref 7.0–26.0)
CALCIUM: 9.4 mg/dL (ref 8.4–10.4)
CHLORIDE: 109 meq/L (ref 98–109)
CO2: 26 mEq/L (ref 22–29)
Creatinine: 0.9 mg/dL (ref 0.7–1.3)
EGFR: 89 mL/min/{1.73_m2} — ABNORMAL LOW (ref >90)
Glucose: 99 mg/dl (ref 70–140)
POTASSIUM: 4.8 meq/L (ref 3.5–5.1)
SODIUM: 142 meq/L (ref 136–145)
TOTAL PROTEIN: 6.8 g/dL (ref 6.4–8.3)
Total Bilirubin: 0.8 mg/dL (ref 0.20–1.20)

## 2014-04-17 ENCOUNTER — Ambulatory Visit (HOSPITAL_BASED_OUTPATIENT_CLINIC_OR_DEPARTMENT_OTHER): Payer: Medicare Other

## 2014-04-17 ENCOUNTER — Other Ambulatory Visit (HOSPITAL_BASED_OUTPATIENT_CLINIC_OR_DEPARTMENT_OTHER): Payer: Medicare Other

## 2014-04-17 ENCOUNTER — Encounter: Payer: Self-pay | Admitting: Internal Medicine

## 2014-04-17 ENCOUNTER — Telehealth: Payer: Self-pay | Admitting: Internal Medicine

## 2014-04-17 ENCOUNTER — Ambulatory Visit (HOSPITAL_BASED_OUTPATIENT_CLINIC_OR_DEPARTMENT_OTHER): Payer: Medicare Other | Admitting: Internal Medicine

## 2014-04-17 VITALS — BP 143/79 | HR 70 | Temp 98.1°F | Resp 18 | Ht 67.25 in | Wt 160.9 lb

## 2014-04-17 DIAGNOSIS — D693 Immune thrombocytopenic purpura: Secondary | ICD-10-CM

## 2014-04-17 DIAGNOSIS — R5382 Chronic fatigue, unspecified: Secondary | ICD-10-CM

## 2014-04-17 DIAGNOSIS — D473 Essential (hemorrhagic) thrombocythemia: Secondary | ICD-10-CM

## 2014-04-17 DIAGNOSIS — M069 Rheumatoid arthritis, unspecified: Secondary | ICD-10-CM

## 2014-04-17 DIAGNOSIS — R5383 Other fatigue: Secondary | ICD-10-CM | POA: Insufficient documentation

## 2014-04-17 LAB — CBC WITH DIFFERENTIAL/PLATELET
BASO%: 1.8 % (ref 0.0–2.0)
BASOS ABS: 0.1 10*3/uL (ref 0.0–0.1)
EOS%: 2.5 % (ref 0.0–7.0)
Eosinophils Absolute: 0.2 10*3/uL (ref 0.0–0.5)
HCT: 32.6 % — ABNORMAL LOW (ref 38.4–49.9)
HEMOGLOBIN: 10.5 g/dL — AB (ref 13.0–17.1)
LYMPH%: 23.4 % (ref 14.0–49.0)
MCH: 33.2 pg (ref 27.2–33.4)
MCHC: 32 g/dL (ref 32.0–36.0)
MCV: 103.6 fL — AB (ref 79.3–98.0)
MONO#: 0.6 10*3/uL (ref 0.1–0.9)
MONO%: 8.9 % (ref 0.0–14.0)
NEUT#: 4.5 10*3/uL (ref 1.5–6.5)
NEUT%: 63.4 % (ref 39.0–75.0)
Platelets: 472 10*3/uL — ABNORMAL HIGH (ref 140–400)
RBC: 3.15 10*6/uL — AB (ref 4.20–5.82)
RDW: 30.3 % — AB (ref 11.0–14.6)
WBC: 7.1 10*3/uL (ref 4.0–10.3)
lymph#: 1.7 10*3/uL (ref 0.9–3.3)

## 2014-04-17 LAB — COMPREHENSIVE METABOLIC PANEL (CC13)
ALT: 16 U/L (ref 0–55)
AST: 14 U/L (ref 5–34)
Albumin: 4 g/dL (ref 3.5–5.0)
Alkaline Phosphatase: 60 U/L (ref 40–150)
Anion Gap: 7 mEq/L (ref 3–11)
BILIRUBIN TOTAL: 0.81 mg/dL (ref 0.20–1.20)
BUN: 20.5 mg/dL (ref 7.0–26.0)
CO2: 26 meq/L (ref 22–29)
CREATININE: 1 mg/dL (ref 0.7–1.3)
Calcium: 9.3 mg/dL (ref 8.4–10.4)
Chloride: 108 mEq/L (ref 98–109)
EGFR: 80 mL/min/{1.73_m2} — AB (ref >90)
Glucose: 101 mg/dl (ref 70–140)
Potassium: 4.3 mEq/L (ref 3.5–5.1)
SODIUM: 141 meq/L (ref 136–145)
TOTAL PROTEIN: 6.8 g/dL (ref 6.4–8.3)

## 2014-04-17 LAB — TSH CHCC: TSH: 1.105 m(IU)/L (ref 0.320–4.118)

## 2014-04-17 MED ORDER — HYDROXYUREA 500 MG PO CAPS: 500.0000 mg | ORAL_CAPSULE | Freq: Every day | ORAL | Status: DC

## 2014-04-17 MED ORDER — ANAGRELIDE HCL 0.5 MG PO CAPS: 0.5000 mg | ORAL_CAPSULE | Freq: Every day | ORAL | Status: DC

## 2014-04-17 NOTE — Telephone Encounter (Signed)
Pt confirmed labs/ov per 01/07 POF, gave pt AVS..... KJ °

## 2014-04-17 NOTE — Progress Notes (Signed)
[place]  Telephone:(336) [phone] Fax:(336) [phone]  OFFICE PROGRESS NOTE  [name], MD [place] [address]  DIAGNOSIS: 1.  Essential thrombocythemia with positive JAK2 mutation 2. Leukocytosis.   PRIOR THERAPY: None.  CURRENT THERAPY: Hydrea 500 mg by mouth once daily and anagrelide 0.5 mg by mouth daily.   INTERVAL HISTORY: Roy Johnson 69 y.o. male returns for a scheduled regular office visit for follow up. The patient is doing well today except for persistent fatigue. He is tolerating his treatment with Hydrea and anagrelide fairly well with no significant adverse effects except for the persistent fatigue. He denied having any significant bleeding, bruises or ecchymosis. The patient denied having any significant fever or chills. He has no nausea or vomiting. He has no significant weight loss or night sweats. He has repeat CBC performed earlier today and he is here for evaluation and discussion of his lab results. He has plans to go to [place] for the winter vacation for the next 3 months and he would like to do his lab work there during that period.  MEDICAL HISTORY: Past Medical History  Diagnosis Date  . Hyperlipidemia   . Anemia   . Prostate cancer                 . Colon polyp   . Diverticulosis   . GERD (gastroesophageal reflux disease)   . Inguinal hernia   . Umbilical hernia   . Diverticulitis   . Prostate cancer   . Vitamin D deficiency     ALLERGIES:  has No Known Allergies.  MEDICATIONS:  Current Outpatient Prescriptions  Medication Sig Dispense Refill  . anagrelide (AGRYLIN) 0.5 MG capsule Take 1 capsule (0.5 mg total) by mouth daily. 30 capsule 1  . hydroxyurea (HYDREA) 500 MG capsule Take 1 capsule (500 mg total) by mouth daily. Take 500mg  daily, alternating with 1000mg  every other day.  May take with food. 90 capsule 1  . hyoscyamine (LEVSIN SL) 0.125 MG SL tablet Place 1 tablet (0.125 mg total) under the  tongue every 4 (four) hours as needed. 30 tablet 3  . ferrous sulfate 325 (65 FE) MG tablet Take 325 mg by mouth daily with breakfast.     No current facility-administered medications for this visit.    SURGICAL HISTORY:  Past Surgical History  Procedure Laterality Date  . Prostatectomy    . Tonsillectomy    . Colonoscopy    . Polypectomy      REVIEW OF SYSTEMS:  Constitutional: positive for fatigue Eyes: negative Ears, nose, mouth, throat, and face: negative Respiratory: negative Cardiovascular: negative Gastrointestinal: negative Genitourinary:negative Integument/breast: negative Hematologic/lymphatic: negative Musculoskeletal:negative Neurological: negative Behavioral/Psych: negative Endocrine: negative Allergic/Immunologic: negative   PHYSICAL EXAMINATION: General appearance: alert, cooperative, appears stated age and no distress Head: Normocephalic, without obvious abnormality, atraumatic Neck: no adenopathy, no carotid bruit, no JVD, supple, symmetrical, trachea midline and thyroid not enlarged, symmetric, no tenderness/mass/nodules Lymph nodes: Cervical, supraclavicular, and axillary nodes normal. Resp: clear to auscultation bilaterally Back: symmetric, no curvature. ROM normal. No CVA tenderness. Cardio: regular rate and rhythm, S1, S2 normal, no murmur, click, rub or gallop GI: mild left lower quadrant tenderness without rebound or referred pain, remainder the abdomen is soft nontender no appreciable organomegaly, bowel sounds present and normally active in all quadrants Extremities: extremities normal, atraumatic, no cyanosis or edema Neurologic: Alert and oriented X 3, normal strength and tone. Normal symmetric reflexes. Normal coordination and gait  ECOG PERFORMANCE STATUS:  1 - Symptomatic but completely ambulatory  Blood pressure 143/79, pulse 70, temperature 98.1 F (36.7 C), temperature source Oral, resp. rate 18, height 5' 7.25" (1.708 m), weight 160 lb  14.4 oz (72.984 kg), SpO2 100 %.  LABORATORY DATA: Lab Results  Component Value Date   WBC 7.1 04/17/2014   HGB 10.5* 04/17/2014   HCT 32.6* 04/17/2014   MCV 103.6* 04/17/2014   PLT 472* 04/17/2014      Chemistry      Component Value Date/Time   NA 141 04/17/2014 0903   K 4.3 04/17/2014 0903   CO2 26 04/17/2014 0903   BUN 20.5 04/17/2014 0903   CREATININE 1.0 04/17/2014 0903      Component Value Date/Time   CALCIUM 9.3 04/17/2014 0903   ALKPHOS 60 04/17/2014 0903   AST 14 04/17/2014 0903   ALT 16 04/17/2014 0903   BILITOT 0.81 04/17/2014 0903       RADIOGRAPHIC STUDIES:  ASSESSMENT AND PLAN:  This is a very pleasant 68 years old recently diagnosed with essential thrombocythemia with positive JAK-2 mutation.  The patient is currently on treatment with Hydrea 500 mg by mouth daily in addition to anagrelide 0.5 mg by mouth daily. He is tolerating this treatment well with no significant adverse effects except for the persistent fatigue. His platelets count 472,000. I recommended for him to continue on the same regimen for now. For the persistent fatigue I will order TSH, rheumatoid factor as well as ANA to rule out any rheumatologic. I will forward the result of his primary care physician for further evaluation and management. He would come back for followup visit in 3 month for reevaluation with repeat blood work on a monthly basis which will be performed in [place] during vacation time there. He was advised to call immediately if he has any concerning symptoms in the interval.  All questions were answered. The patient knows to call the clinic with any problems, questions or concerns. We can certainly see the patient much sooner if necessary.  Disclaimer: This note was dictated with voice recognition software. Similar sounding words can inadvertently be transcribed and may not be corrected upon review.

## 2014-04-18 LAB — ANA: ANA: NEGATIVE

## 2014-04-18 LAB — RHEUMATOID FACTOR: RHEUMATOID FACTOR: 49 [IU]/mL — AB (ref <=14)

## 2014-05-22 ENCOUNTER — Telehealth: Payer: Self-pay | Admitting: *Deleted

## 2014-05-22 NOTE — Telephone Encounter (Signed)
Lab results from [facility] given to Dr Vista Mink to review (CBC, CMET).

## 2014-05-22 NOTE — Telephone Encounter (Addendum)
Per Dr Vista Mink, plt count 698.  Pt is currently taking 500mg  hydrea daily.  Per Dr Vista Mink pt can either increases to 500mg  alternating with 1000mg  every other day or continue 500mg  daily until he is seen again in April.  Attempted to call patient, no answer, left msg to call us back.

## 2014-05-26 ENCOUNTER — Telehealth: Payer: Self-pay | Admitting: Medical Oncology

## 2014-05-26 NOTE — Telephone Encounter (Signed)
Re Hydrea dose. Per Julien Nordmann pt may stay at same dose or alternate 500mg  w/1000mg  every other day. Attempted to call pt and did not get an answer.

## 2014-06-17 ENCOUNTER — Telehealth: Payer: Self-pay | Admitting: Medical Oncology

## 2014-06-17 NOTE — Telephone Encounter (Signed)
I left message with pt high potassium ( [facility]) and to call me back regarding his diet.

## 2014-06-19 ENCOUNTER — Other Ambulatory Visit: Payer: Self-pay | Admitting: *Deleted

## 2014-06-19 DIAGNOSIS — D473 Essential (hemorrhagic) thrombocythemia: Secondary | ICD-10-CM

## 2014-06-19 NOTE — Progress Notes (Signed)
BMET order faxed to [phone] [facility] in [place] Ph: [phone]

## 2014-06-20 ENCOUNTER — Telehealth: Payer: Self-pay | Admitting: *Deleted

## 2014-06-20 NOTE — Telephone Encounter (Signed)
Labs reviewed by MD K+ 5.4. Per MD I instructed pt to continue routine labs as previously scheduled and avoid K+ rich foods. No other changes at this time. Discussed with pt and gave examples of K+ rich foods to avoid. Pt requested this list to be emailed to him, unable to e-mail pt a list but I offered to mail pt copy. Pt declined. Informed pt he would also be able to find examples of K- rich foods on line. Pt verbalized he has Internet access and will google it. No further concerns at this time.

## 2014-07-14 ENCOUNTER — Telehealth: Payer: Self-pay | Admitting: *Deleted

## 2014-07-14 DIAGNOSIS — D473 Essential (hemorrhagic) thrombocythemia: Secondary | ICD-10-CM

## 2014-07-14 NOTE — Telephone Encounter (Signed)
POF sent for labs CBC/CMP.

## 2014-07-15 ENCOUNTER — Other Ambulatory Visit (HOSPITAL_BASED_OUTPATIENT_CLINIC_OR_DEPARTMENT_OTHER): Payer: Medicare Other

## 2014-07-15 DIAGNOSIS — D473 Essential (hemorrhagic) thrombocythemia: Secondary | ICD-10-CM | POA: Diagnosis not present

## 2014-07-15 LAB — COMPREHENSIVE METABOLIC PANEL (CC13)
ALBUMIN: 4.1 g/dL (ref 3.5–5.0)
ALT: 15 U/L (ref 0–55)
AST: 13 U/L (ref 5–34)
Alkaline Phosphatase: 59 U/L (ref 40–150)
Anion Gap: 7 mEq/L (ref 3–11)
BUN: 17.2 mg/dL (ref 7.0–26.0)
CALCIUM: 9.3 mg/dL (ref 8.4–10.4)
CO2: 25 mEq/L (ref 22–29)
CREATININE: 0.8 mg/dL (ref 0.7–1.3)
Chloride: 110 mEq/L — ABNORMAL HIGH (ref 98–109)
EGFR: >90 mL/min/{1.73_m2} (ref >90)
Glucose: 94 mg/dl (ref 70–140)
Potassium: 4.3 mEq/L (ref 3.5–5.1)
SODIUM: 142 meq/L (ref 136–145)
Total Bilirubin: 0.83 mg/dL (ref 0.20–1.20)
Total Protein: 7 g/dL (ref 6.4–8.3)

## 2014-07-15 LAB — CBC WITH DIFFERENTIAL/PLATELET
BASO%: 0.5 % (ref 0.0–2.0)
BASOS ABS: 0 10*3/uL (ref 0.0–0.1)
EOS%: 2.8 % (ref 0.0–7.0)
Eosinophils Absolute: 0.2 10*3/uL (ref 0.0–0.5)
HCT: 29.9 % — ABNORMAL LOW (ref 38.4–49.9)
HGB: 9.7 g/dL — ABNORMAL LOW (ref 13.0–17.1)
LYMPH#: 1.6 10*3/uL (ref 0.9–3.3)
LYMPH%: 18.4 % (ref 14.0–49.0)
MCH: 30.6 pg (ref 27.2–33.4)
MCHC: 32.4 g/dL (ref 32.0–36.0)
MCV: 94.3 fL (ref 79.3–98.0)
MONO#: 0.9 10*3/uL (ref 0.1–0.9)
MONO%: 10.5 % (ref 0.0–14.0)
NEUT%: 67.8 % (ref 39.0–75.0)
NEUTROS ABS: 5.8 10*3/uL (ref 1.5–6.5)
Platelets: 650 10*3/uL — ABNORMAL HIGH (ref 140–400)
RBC: 3.17 10*6/uL — AB (ref 4.20–5.82)
WBC: 8.6 10*3/uL (ref 4.0–10.3)
nRBC: 1 % — ABNORMAL HIGH (ref 0–0)

## 2014-07-24 ENCOUNTER — Other Ambulatory Visit: Payer: Medicare Other

## 2014-07-24 ENCOUNTER — Ambulatory Visit: Payer: Medicare Other | Admitting: Internal Medicine

## 2014-08-07 ENCOUNTER — Ambulatory Visit (HOSPITAL_BASED_OUTPATIENT_CLINIC_OR_DEPARTMENT_OTHER): Payer: Medicare Other

## 2014-08-07 ENCOUNTER — Encounter: Payer: Self-pay | Admitting: Internal Medicine

## 2014-08-07 ENCOUNTER — Ambulatory Visit (HOSPITAL_BASED_OUTPATIENT_CLINIC_OR_DEPARTMENT_OTHER): Payer: Medicare Other | Admitting: Internal Medicine

## 2014-08-07 ENCOUNTER — Telehealth: Payer: Self-pay | Admitting: Internal Medicine

## 2014-08-07 VITALS — BP 147/73 | HR 69 | Temp 98.1°F | Resp 18 | Ht 67.25 in | Wt 161.6 lb

## 2014-08-07 DIAGNOSIS — R5383 Other fatigue: Secondary | ICD-10-CM

## 2014-08-07 DIAGNOSIS — D473 Essential (hemorrhagic) thrombocythemia: Secondary | ICD-10-CM | POA: Diagnosis not present

## 2014-08-07 DIAGNOSIS — R5382 Chronic fatigue, unspecified: Secondary | ICD-10-CM

## 2014-08-07 DIAGNOSIS — M791 Myalgia: Secondary | ICD-10-CM | POA: Diagnosis not present

## 2014-08-07 LAB — COMPREHENSIVE METABOLIC PANEL (CC13)
ALT: 15 U/L (ref 0–55)
ANION GAP: 8 meq/L (ref 3–11)
AST: 14 U/L (ref 5–34)
Albumin: 3.8 g/dL (ref 3.5–5.0)
Alkaline Phosphatase: 64 U/L (ref 40–150)
BILIRUBIN TOTAL: 0.73 mg/dL (ref 0.20–1.20)
BUN: 13.5 mg/dL (ref 7.0–26.0)
CO2: 22 meq/L (ref 22–29)
Calcium: 9.4 mg/dL (ref 8.4–10.4)
Chloride: 111 mEq/L — ABNORMAL HIGH (ref 98–109)
Creatinine: 0.8 mg/dL (ref 0.7–1.3)
EGFR: 90 mL/min/{1.73_m2} — AB (ref >90)
GLUCOSE: 97 mg/dL (ref 70–140)
Potassium: 4.5 mEq/L (ref 3.5–5.1)
Sodium: 141 mEq/L (ref 136–145)
TOTAL PROTEIN: 6.8 g/dL (ref 6.4–8.3)

## 2014-08-07 LAB — CBC WITH DIFFERENTIAL/PLATELET
BASO%: 0.9 % (ref 0.0–2.0)
Basophils Absolute: 0.1 10*3/uL (ref 0.0–0.1)
EOS%: 3.4 % (ref 0.0–7.0)
Eosinophils Absolute: 0.4 10*3/uL (ref 0.0–0.5)
HCT: 30.2 % — ABNORMAL LOW (ref 38.4–49.9)
HGB: 9.8 g/dL — ABNORMAL LOW (ref 13.0–17.1)
LYMPH#: 1.4 10*3/uL (ref 0.9–3.3)
LYMPH%: 11.7 % — AB (ref 14.0–49.0)
MCH: 32.2 pg (ref 27.2–33.4)
MCHC: 32.5 g/dL (ref 32.0–36.0)
MCV: 99.1 fL — ABNORMAL HIGH (ref 79.3–98.0)
MONO#: 1 10*3/uL — ABNORMAL HIGH (ref 0.1–0.9)
MONO%: 8.7 % (ref 0.0–14.0)
NEUT#: 8.8 10*3/uL — ABNORMAL HIGH (ref 1.5–6.5)
NEUT%: 75.3 % — ABNORMAL HIGH (ref 39.0–75.0)
PLATELETS: 634 10*3/uL — AB (ref 140–400)
RBC: 3.04 10*6/uL — AB (ref 4.20–5.82)
RDW: 32.8 % — ABNORMAL HIGH (ref 11.0–14.6)
WBC: 11.7 10*3/uL — ABNORMAL HIGH (ref 4.0–10.3)

## 2014-08-07 LAB — TECHNOLOGIST REVIEW

## 2014-08-07 LAB — LACTATE DEHYDROGENASE (CC13): LDH: 221 U/L (ref 125–245)

## 2014-08-07 NOTE — Telephone Encounter (Signed)
Gave avs & calendar May thru July.

## 2014-08-07 NOTE — Progress Notes (Signed)
[place]  Telephone:(336) [phone] Fax:(336) [phone]  OFFICE PROGRESS NOTE  [name], MD [place] [address]  DIAGNOSIS: 1.  Essential thrombocythemia with positive JAK2 mutation 2. Leukocytosis.   PRIOR THERAPY: None.  CURRENT THERAPY: Hydrea 500 mg by mouth once daily and anagrelide 0.5 mg by mouth daily.   INTERVAL HISTORY: Roy Johnson 69 y.o. male returns for a scheduled regular office visit for follow up. The patient spent the last 4 months in [place]. The patient is doing well today except for persistent fatigue and mild peripheral neuropathy and muscle spasm in the legs. He is tolerating his treatment with Hydrea and anagrelide fairly well with no significant adverse effects except for the fatigue. He denied having any significant bleeding, bruises or ecchymosis. The patient denied having any significant fever or chills. He has no nausea or vomiting. He has no significant weight loss or night sweats. He has repeat CBC performed earlier today and he is here for evaluation and discussion of his lab results.   MEDICAL HISTORY: Past Medical History  Diagnosis Date  . Hyperlipidemia   . Anemia   . Prostate cancer                 . Colon polyp   . Diverticulosis   . GERD (gastroesophageal reflux disease)   . Inguinal hernia   . Umbilical hernia   . Diverticulitis   . Prostate cancer   . Vitamin D deficiency     ALLERGIES:  has No Known Allergies.  MEDICATIONS:  Current Outpatient Prescriptions  Medication Sig Dispense Refill  . anagrelide (AGRYLIN) 0.5 MG capsule Take 1 capsule (0.5 mg total) by mouth daily. 90 capsule 1  . ferrous sulfate 325 (65 FE) MG tablet Take 325 mg by mouth daily with breakfast.    . hydroxyurea (HYDREA) 500 MG capsule Take 1 capsule (500 mg total) by mouth daily. Take 500mg  daily, alternating with 1000mg  every other day.  May take with food. (Patient taking differently: Take 500 mg by mouth  daily. Take 500mg  daily,) 90 capsule 1  . hyoscyamine (LEVSIN SL) 0.125 MG SL tablet Place 1 tablet (0.125 mg total) under the tongue every 4 (four) hours as needed. (Patient not taking: Reported on 08/07/2014) 30 tablet 3   No current facility-administered medications for this visit.    SURGICAL HISTORY:  Past Surgical History  Procedure Laterality Date  . Prostatectomy    . Tonsillectomy    . Colonoscopy    . Polypectomy      REVIEW OF SYSTEMS:  A comprehensive review of systems was negative except for: Constitutional: positive for fatigue Musculoskeletal: positive for myalgias   PHYSICAL EXAMINATION: General appearance: alert, cooperative, appears stated age and no distress Head: Normocephalic, without obvious abnormality, atraumatic Neck: no adenopathy, no carotid bruit, no JVD, supple, symmetrical, trachea midline and thyroid not enlarged, symmetric, no tenderness/mass/nodules Lymph nodes: Cervical, supraclavicular, and axillary nodes normal. Resp: clear to auscultation bilaterally Back: symmetric, no curvature. ROM normal. No CVA tenderness. Cardio: regular rate and rhythm, S1, S2 normal, no murmur, click, rub or gallop GI: mild left lower quadrant tenderness without rebound or referred pain, remainder the abdomen is soft nontender no appreciable organomegaly, bowel sounds present and normally active in all quadrants Extremities: extremities normal, atraumatic, no cyanosis or edema Neurologic: Alert and oriented X 3, normal strength and tone. Normal symmetric reflexes. Normal coordination and gait  ECOG PERFORMANCE STATUS: 1 - Symptomatic but completely ambulatory  Blood  pressure 147/73, pulse 69, temperature 98.1 F (36.7 C), temperature source Oral, resp. rate 18, height 5' 7.25" (1.708 m), weight 161 lb 9.6 oz (73.301 kg), SpO2 100 %.  LABORATORY DATA: Lab Results  Component Value Date   WBC 8.6 07/15/2014   HGB 9.7* 07/15/2014   HCT 29.9* 07/15/2014   MCV 94.3  07/15/2014   PLT 650 Large & giant platelets* 07/15/2014      Chemistry      Component Value Date/Time   NA 142 07/15/2014 0932   K 4.3 07/15/2014 0932   CO2 25 07/15/2014 0932   BUN 17.2 07/15/2014 0932   CREATININE 0.8 07/15/2014 0932      Component Value Date/Time   CALCIUM 9.3 07/15/2014 0932   ALKPHOS 59 07/15/2014 0932   AST 13 07/15/2014 0932   ALT 15 07/15/2014 0932   BILITOT 0.83 07/15/2014 0932     Rheumatoid factor 49, ANA negative, TSH 1.105.   RADIOGRAPHIC STUDIES:  ASSESSMENT AND PLAN:  This is a very pleasant 69 years old recently diagnosed with essential thrombocythemia with positive JAK-2 mutation.  The patient is currently on treatment with Hydrea 500 mg by mouth daily in addition to anagrelide 0.5 mg by mouth daily. He is tolerating this treatment well with no significant adverse effects except for the persistent fatigue and myalgia. His platelets count 650,000. I had a lengthy discussion with the patient today about his condition. I gave him the option of increasing the dose of hydroxyurea versus increasing the dose of anagrelide. The patient is willing to consider higher dose of hydroxyurea at this point. We will change the dose of hydroxyurea to 1000 mg by mouth daily. For the myalgia and arthralgia, the patient has elevated rheumatoid factor. I will refer him to rheumatology for evaluation. He would come back for followup visit in 3 month for reevaluation with repeat blood work on a monthly basis. He was advised to call immediately if he has any concerning symptoms in the interval.  All questions were answered. The patient knows to call the clinic with any problems, questions or concerns. We can certainly see the patient much sooner if necessary.  Disclaimer: This note was dictated with voice recognition software. Similar sounding words can inadvertently be transcribed and may not be corrected upon review.

## 2014-08-21 ENCOUNTER — Telehealth: Payer: Self-pay | Admitting: Internal Medicine

## 2014-08-21 NOTE — Telephone Encounter (Signed)
Faxed pt medical records to Dr. Ouida Sills [phone]

## 2014-09-04 ENCOUNTER — Other Ambulatory Visit (HOSPITAL_BASED_OUTPATIENT_CLINIC_OR_DEPARTMENT_OTHER): Payer: Medicare Other

## 2014-09-04 DIAGNOSIS — D693 Immune thrombocytopenic purpura: Secondary | ICD-10-CM

## 2014-09-04 DIAGNOSIS — D473 Essential (hemorrhagic) thrombocythemia: Secondary | ICD-10-CM

## 2014-09-04 LAB — CBC WITH DIFFERENTIAL/PLATELET
BASO%: 1.2 % (ref 0.0–2.0)
Basophils Absolute: 0.1 10*3/uL (ref 0.0–0.1)
EOS ABS: 0.2 10*3/uL (ref 0.0–0.5)
EOS%: 3.5 % (ref 0.0–7.0)
HCT: 29.3 % — ABNORMAL LOW (ref 38.4–49.9)
HEMOGLOBIN: 9.7 g/dL — AB (ref 13.0–17.1)
LYMPH#: 1.2 10*3/uL (ref 0.9–3.3)
LYMPH%: 24 % (ref 14.0–49.0)
MCH: 31.9 pg (ref 27.2–33.4)
MCHC: 33.1 g/dL (ref 32.0–36.0)
MCV: 96.4 fL (ref 79.3–98.0)
MONO#: 0.7 10*3/uL (ref 0.1–0.9)
MONO%: 13.8 % (ref 0.0–14.0)
NEUT#: 2.8 10*3/uL (ref 1.5–6.5)
NEUT%: 57.5 % (ref 39.0–75.0)
NRBC: 0 % (ref 0–0)
PLATELETS: 611 10*3/uL — AB (ref 140–400)
RBC: 3.04 10*6/uL — ABNORMAL LOW (ref 4.20–5.82)
RDW: 29 % — ABNORMAL HIGH (ref 11.0–14.6)
WBC: 4.9 10*3/uL (ref 4.0–10.3)

## 2014-09-04 LAB — COMPREHENSIVE METABOLIC PANEL (CC13)
ALBUMIN: 4 g/dL (ref 3.5–5.0)
ALT: 12 U/L (ref 0–55)
ANION GAP: 7 meq/L (ref 3–11)
AST: 16 U/L (ref 5–34)
Alkaline Phosphatase: 81 U/L (ref 40–150)
BUN: 21.8 mg/dL (ref 7.0–26.0)
CHLORIDE: 107 meq/L (ref 98–109)
CO2: 24 meq/L (ref 22–29)
Calcium: 8.9 mg/dL (ref 8.4–10.4)
Creatinine: 0.8 mg/dL (ref 0.7–1.3)
EGFR: >90 mL/min/{1.73_m2} (ref >90)
Glucose: 98 mg/dl (ref 70–140)
Potassium: 4.5 mEq/L (ref 3.5–5.1)
Sodium: 138 mEq/L (ref 136–145)
Total Bilirubin: 0.82 mg/dL (ref 0.20–1.20)
Total Protein: 7.2 g/dL (ref 6.4–8.3)

## 2014-09-09 ENCOUNTER — Encounter: Payer: Self-pay | Admitting: Internal Medicine

## 2014-09-10 ENCOUNTER — Other Ambulatory Visit: Payer: Self-pay | Admitting: Medical Oncology

## 2014-09-10 DIAGNOSIS — D473 Essential (hemorrhagic) thrombocythemia: Secondary | ICD-10-CM

## 2014-09-10 MED ORDER — HYDROXYUREA 500 MG PO CAPS: 1000.0000 mg | ORAL_CAPSULE | Freq: Every day | ORAL | Status: DC

## 2014-09-24 ENCOUNTER — Telehealth: Payer: Self-pay | Admitting: Medical Oncology

## 2014-09-24 NOTE — Telephone Encounter (Signed)
Pt has enough hydrea for 1 month

## 2014-10-02 ENCOUNTER — Other Ambulatory Visit (HOSPITAL_BASED_OUTPATIENT_CLINIC_OR_DEPARTMENT_OTHER): Payer: Medicare Other

## 2014-10-02 DIAGNOSIS — D473 Essential (hemorrhagic) thrombocythemia: Secondary | ICD-10-CM

## 2014-10-02 LAB — CBC WITH DIFFERENTIAL/PLATELET
BASO%: 0.8 % (ref 0.0–2.0)
Basophils Absolute: 0 10*3/uL (ref 0.0–0.1)
EOS ABS: 0.1 10*3/uL (ref 0.0–0.5)
EOS%: 2 % (ref 0.0–7.0)
HEMATOCRIT: 25.1 % — AB (ref 38.4–49.9)
HEMOGLOBIN: 8.2 g/dL — AB (ref 13.0–17.1)
LYMPH#: 1.1 10*3/uL (ref 0.9–3.3)
LYMPH%: 21.3 % (ref 14.0–49.0)
MCH: 32.9 pg (ref 27.2–33.4)
MCHC: 32.7 g/dL (ref 32.0–36.0)
MCV: 100.8 fL — ABNORMAL HIGH (ref 79.3–98.0)
MONO#: 0.5 10*3/uL (ref 0.1–0.9)
MONO%: 10.1 % (ref 0.0–14.0)
NEUT%: 65.8 % (ref 39.0–75.0)
NEUTROS ABS: 3.3 10*3/uL (ref 1.5–6.5)
Platelets: 491 10*3/uL — ABNORMAL HIGH (ref 140–400)
RBC: 2.49 10*6/uL — ABNORMAL LOW (ref 4.20–5.82)
RDW: 29.9 % — ABNORMAL HIGH (ref 11.0–14.6)
WBC: 5.1 10*3/uL (ref 4.0–10.3)
nRBC: 0 % (ref 0–0)

## 2014-10-02 LAB — COMPREHENSIVE METABOLIC PANEL (CC13)
ALBUMIN: 3.8 g/dL (ref 3.5–5.0)
ALT: 14 U/L (ref 0–55)
AST: 14 U/L (ref 5–34)
Alkaline Phosphatase: 64 U/L (ref 40–150)
Anion Gap: 4 mEq/L (ref 3–11)
BUN: 24.3 mg/dL (ref 7.0–26.0)
CALCIUM: 8.8 mg/dL (ref 8.4–10.4)
CHLORIDE: 110 meq/L — AB (ref 98–109)
CO2: 28 mEq/L (ref 22–29)
Creatinine: 0.8 mg/dL (ref 0.7–1.3)
EGFR: >90 mL/min/{1.73_m2} (ref >90)
Glucose: 97 mg/dl (ref 70–140)
Potassium: 4.9 mEq/L (ref 3.5–5.1)
Sodium: 142 mEq/L (ref 136–145)
Total Bilirubin: 0.88 mg/dL (ref 0.20–1.20)
Total Protein: 6.5 g/dL (ref 6.4–8.3)

## 2014-10-06 ENCOUNTER — Other Ambulatory Visit: Payer: Self-pay

## 2014-10-30 ENCOUNTER — Telehealth: Payer: Self-pay | Admitting: Internal Medicine

## 2014-10-30 ENCOUNTER — Other Ambulatory Visit (HOSPITAL_BASED_OUTPATIENT_CLINIC_OR_DEPARTMENT_OTHER): Payer: Medicare Other

## 2014-10-30 ENCOUNTER — Ambulatory Visit (HOSPITAL_BASED_OUTPATIENT_CLINIC_OR_DEPARTMENT_OTHER): Payer: Medicare Other | Admitting: Internal Medicine

## 2014-10-30 ENCOUNTER — Other Ambulatory Visit: Payer: Self-pay | Admitting: Medical Oncology

## 2014-10-30 ENCOUNTER — Encounter: Payer: Self-pay | Admitting: Internal Medicine

## 2014-10-30 ENCOUNTER — Telehealth: Payer: Self-pay | Admitting: *Deleted

## 2014-10-30 ENCOUNTER — Ambulatory Visit (HOSPITAL_COMMUNITY)
Admission: RE | Admit: 2014-10-30 | Discharge: 2014-10-30 | Disposition: A | Discharge status: Home / Self Care | Payer: Medicare Other | Source: Ambulatory Visit | Attending: Internal Medicine | Admitting: Internal Medicine

## 2014-10-30 VITALS — BP 125/67 | HR 71 | Temp 98.1°F | Resp 17 | Ht 67.25 in | Wt 161.6 lb

## 2014-10-30 DIAGNOSIS — T451X5A Adverse effect of antineoplastic and immunosuppressive drugs, initial encounter: Secondary | ICD-10-CM

## 2014-10-30 DIAGNOSIS — D473 Essential (hemorrhagic) thrombocythemia: Secondary | ICD-10-CM | POA: Diagnosis not present

## 2014-10-30 DIAGNOSIS — D649 Anemia, unspecified: Secondary | ICD-10-CM | POA: Insufficient documentation

## 2014-10-30 DIAGNOSIS — D6481 Anemia due to antineoplastic chemotherapy: Secondary | ICD-10-CM | POA: Diagnosis not present

## 2014-10-30 LAB — COMPREHENSIVE METABOLIC PANEL (CC13)
ALT: 20 U/L (ref 0–55)
AST: 16 U/L (ref 5–34)
Albumin: 4 g/dL (ref 3.5–5.0)
Alkaline Phosphatase: 63 U/L (ref 40–150)
Anion Gap: 5 mEq/L (ref 3–11)
BUN: 17.9 mg/dL (ref 7.0–26.0)
CO2: 25 mEq/L (ref 22–29)
CREATININE: 0.8 mg/dL (ref 0.7–1.3)
Calcium: 8.7 mg/dL (ref 8.4–10.4)
Chloride: 112 mEq/L — ABNORMAL HIGH (ref 98–109)
EGFR: >90 mL/min/{1.73_m2} (ref >90)
GLUCOSE: 96 mg/dL (ref 70–140)
POTASSIUM: 4.5 meq/L (ref 3.5–5.1)
Sodium: 141 mEq/L (ref 136–145)
Total Bilirubin: 1.1 mg/dL (ref 0.20–1.20)
Total Protein: 6.5 g/dL (ref 6.4–8.3)

## 2014-10-30 LAB — CBC WITH DIFFERENTIAL/PLATELET
BASO%: 1 % (ref 0.0–2.0)
BASOS ABS: 0 10*3/uL (ref 0.0–0.1)
EOS%: 2.6 % (ref 0.0–7.0)
Eosinophils Absolute: 0.1 10*3/uL (ref 0.0–0.5)
HEMATOCRIT: 22.1 % — AB (ref 38.4–49.9)
HGB: 7.3 g/dL — ABNORMAL LOW (ref 13.0–17.1)
LYMPH%: 27.3 % (ref 14.0–49.0)
MCH: 37.2 pg — AB (ref 27.2–33.4)
MCHC: 33.2 g/dL (ref 32.0–36.0)
MCV: 112 fL — ABNORMAL HIGH (ref 79.3–98.0)
MONO#: 0.4 10*3/uL (ref 0.1–0.9)
MONO%: 11.8 % (ref 0.0–14.0)
NEUT#: 2.1 10*3/uL (ref 1.5–6.5)
NEUT%: 57.3 % (ref 39.0–75.0)
Platelets: 469 10*3/uL — ABNORMAL HIGH (ref 140–400)
RBC: 1.97 10*6/uL — ABNORMAL LOW (ref 4.20–5.82)
RDW: 33.3 % — ABNORMAL HIGH (ref 11.0–14.6)
WBC: 3.6 10*3/uL — ABNORMAL LOW (ref 4.0–10.3)
lymph#: 1 10*3/uL (ref 0.9–3.3)

## 2014-10-30 NOTE — Progress Notes (Signed)
[place]  Telephone:(336) [phone] Fax:(336) [phone]  OFFICE PROGRESS NOTE  [name], MD [place] [address]  DIAGNOSIS: 1.  Essential thrombocythemia with positive JAK2 mutation 2. Leukocytosis.   PRIOR THERAPY: Previous treatment with combination of Hydrea and anagrelide.  CURRENT THERAPY: Hydrea 1000 mg by mouth once daily.   INTERVAL HISTORY: Roy Johnson 69 y.o. male returns for a scheduled regular office visit for follow up. The patient is doing well today except for increasing fatigue. He is tolerating his treatment with Hydrea fairly well with no significant adverse effects except for the fatigue. He denied having any significant bleeding, bruises or ecchymosis. The patient denied having any significant fever or chills. He has no nausea or vomiting. He has no significant weight loss or night sweats. He has repeat CBC performed earlier today and he is here for evaluation and discussion of his lab results.   MEDICAL HISTORY: Past Medical History  Diagnosis Date  . Hyperlipidemia   . Anemia   . Prostate cancer                 . Colon polyp   . Diverticulosis   . GERD (gastroesophageal reflux disease)   . Inguinal hernia   . Umbilical hernia   . Diverticulitis   . Prostate cancer   . Vitamin D deficiency     ALLERGIES:  has No Known Allergies.  MEDICATIONS:  Current Outpatient Prescriptions  Medication Sig Dispense Refill  . ferrous sulfate 325 (65 FE) MG tablet Take 325 mg by mouth daily with breakfast.    . hydroxyurea (HYDREA) 500 MG capsule Take 2 capsules (1,000 mg total) by mouth daily. May take with food. 120 capsule 1  . hyoscyamine (LEVSIN SL) 0.125 MG SL tablet Place 1 tablet (0.125 mg total) under the tongue every 4 (four) hours as needed. 30 tablet 3   No current facility-administered medications for this visit.    SURGICAL HISTORY:  Past Surgical History  Procedure Laterality Date  . Prostatectomy     . Tonsillectomy    . Colonoscopy    . Polypectomy      REVIEW OF SYSTEMS:  A comprehensive review of systems was negative except for: Constitutional: positive for fatigue   PHYSICAL EXAMINATION: General appearance: alert, cooperative, appears stated age and no distress Head: Normocephalic, without obvious abnormality, atraumatic Neck: no adenopathy, no carotid bruit, no JVD, supple, symmetrical, trachea midline and thyroid not enlarged, symmetric, no tenderness/mass/nodules Lymph nodes: Cervical, supraclavicular, and axillary nodes normal. Resp: clear to auscultation bilaterally Back: symmetric, no curvature. ROM normal. No CVA tenderness. Cardio: regular rate and rhythm, S1, S2 normal, no murmur, click, rub or gallop GI: mild left lower quadrant tenderness without rebound or referred pain, remainder the abdomen is soft nontender no appreciable organomegaly, bowel sounds present and normally active in all quadrants Extremities: extremities normal, atraumatic, no cyanosis or edema Neurologic: Alert and oriented X 3, normal strength and tone. Normal symmetric reflexes. Normal coordination and gait  ECOG PERFORMANCE STATUS: 1 - Symptomatic but completely ambulatory  Blood pressure 125/67, pulse 71, temperature 98.1 F (36.7 C), temperature source Oral, resp. rate 17, height 5' 7.25" (1.708 m), weight 161 lb 9.6 oz (73.301 kg), SpO2 99 %.  LABORATORY DATA: Lab Results  Component Value Date   WBC 3.6* 10/30/2014   HGB 7.3* 10/30/2014   HCT 22.1* 10/30/2014   MCV 112.0* 10/30/2014   PLT 469* 10/30/2014      Chemistry  Component Value Date/Time   NA 142 10/02/2014 0801   K 4.9 10/02/2014 0801   CO2 28 10/02/2014 0801   BUN 24.3 10/02/2014 0801   CREATININE 0.8 10/02/2014 0801      Component Value Date/Time   CALCIUM 8.8 10/02/2014 0801   ALKPHOS 64 10/02/2014 0801   AST 14 10/02/2014 0801   ALT 14 10/02/2014 0801   BILITOT 0.88 10/02/2014 0801      RADIOGRAPHIC  STUDIES:  ASSESSMENT AND PLAN:  This is a very pleasant 69 years old recently diagnosed with essential thrombocythemia with positive JAK-2 mutation.  The patient is currently on treatment with Hydrea 1000 mg by mouth daily. He is tolerating this treatment well with no significant adverse effects except for the persistent fatigue. His platelets count 469,000, but unfortunately his hemoglobin dropped down to 7.4 G/DL with decrease in the total white blood count. I had a lengthy discussion with the patient today about his condition.  I recommended for him to decrease the dose of hydroxyurea to 1000 mg alternating with 500 mg every other day. For the chemotherapy-induced anemia, I will arrange for the patient to receive 2 units of PRBCs transfusion. He would come back for followup visit in 2 month for reevaluation with repeat blood work on a monthly basis. He was advised to call immediately if he has any concerning symptoms in the interval.  All questions were answered. The patient knows to call the clinic with any problems, questions or concerns. We can certainly see the patient much sooner if necessary.  Disclaimer: This note was dictated with voice recognition software. Similar sounding words can inadvertently be transcribed and may not be corrected upon review.

## 2014-10-30 NOTE — Telephone Encounter (Signed)
Per staff message and POF I have scheduled appts. Advised scheduler of appts. JMW  

## 2014-10-30 NOTE — Telephone Encounter (Signed)
per pof to sch pt appt-sent MW email to sch pt trmt-will call pt once reply-gave pt copy of avs

## 2014-10-31 ENCOUNTER — Other Ambulatory Visit (HOSPITAL_BASED_OUTPATIENT_CLINIC_OR_DEPARTMENT_OTHER): Payer: Medicare Other

## 2014-10-31 ENCOUNTER — Telehealth: Payer: Self-pay | Admitting: Internal Medicine

## 2014-10-31 ENCOUNTER — Telehealth: Payer: Self-pay | Admitting: Medical Oncology

## 2014-10-31 ENCOUNTER — Other Ambulatory Visit: Payer: Self-pay | Admitting: Medical Oncology

## 2014-10-31 DIAGNOSIS — D649 Anemia, unspecified: Secondary | ICD-10-CM | POA: Diagnosis present

## 2014-10-31 DIAGNOSIS — D473 Essential (hemorrhagic) thrombocythemia: Secondary | ICD-10-CM | POA: Diagnosis not present

## 2014-10-31 LAB — COMPREHENSIVE METABOLIC PANEL (CC13)
ALK PHOS: 62 U/L (ref 40–150)
ALT: 19 U/L (ref 0–55)
AST: 18 U/L (ref 5–34)
Albumin: 4.1 g/dL (ref 3.5–5.0)
Anion Gap: 8 mEq/L (ref 3–11)
BUN: 27.1 mg/dL — ABNORMAL HIGH (ref 7.0–26.0)
CALCIUM: 9.5 mg/dL (ref 8.4–10.4)
CHLORIDE: 111 meq/L — AB (ref 98–109)
CO2: 26 meq/L (ref 22–29)
Creatinine: 0.9 mg/dL (ref 0.7–1.3)
EGFR: 87 mL/min/{1.73_m2} — AB (ref >90)
GLUCOSE: 159 mg/dL — AB (ref 70–140)
POTASSIUM: 5 meq/L (ref 3.5–5.1)
Sodium: 144 mEq/L (ref 136–145)
Total Bilirubin: 1.39 mg/dL — ABNORMAL HIGH (ref 0.20–1.20)
Total Protein: 6.8 g/dL (ref 6.4–8.3)

## 2014-10-31 LAB — CBC WITH DIFFERENTIAL/PLATELET
BASO%: 3.6 % — ABNORMAL HIGH (ref 0.0–2.0)
Basophils Absolute: 0.1 10*3/uL (ref 0.0–0.1)
EOS%: 1.8 % (ref 0.0–7.0)
Eosinophils Absolute: 0.1 10*3/uL (ref 0.0–0.5)
HCT: 21.5 % — ABNORMAL LOW (ref 38.4–49.9)
HEMOGLOBIN: 6.9 g/dL — AB (ref 13.0–17.1)
LYMPH%: 19.4 % (ref 14.0–49.0)
MCH: 37.5 pg — ABNORMAL HIGH (ref 27.2–33.4)
MCHC: 32.2 g/dL (ref 32.0–36.0)
MCV: 116.2 fL — AB (ref 79.3–98.0)
MONO#: 0.3 10*3/uL (ref 0.1–0.9)
MONO%: 8.6 % (ref 0.0–14.0)
NEUT%: 66.6 % (ref 39.0–75.0)
NEUTROS ABS: 2.7 10*3/uL (ref 1.5–6.5)
PLATELETS: 486 10*3/uL — AB (ref 140–400)
RBC: 1.85 10*6/uL — AB (ref 4.20–5.82)
RDW: 33 % — ABNORMAL HIGH (ref 11.0–14.6)
WBC: 4 10*3/uL (ref 4.0–10.3)
lymph#: 0.8 10*3/uL — ABNORMAL LOW (ref 0.9–3.3)

## 2014-10-31 LAB — ABO/RH: ABO/RH(D): O POS

## 2014-10-31 NOTE — Telephone Encounter (Signed)
per pof to sch pt appt-cld & spoke to pt to adv of time & date of blood tran-pt understood

## 2014-10-31 NOTE — Progress Notes (Signed)
HAR done

## 2014-10-31 NOTE — Telephone Encounter (Signed)
Pt notified of lab appt today dn blood transfusion on Monday. Orders in.

## 2014-11-02 DIAGNOSIS — D6481 Anemia due to antineoplastic chemotherapy: Secondary | ICD-10-CM | POA: Insufficient documentation

## 2014-11-02 DIAGNOSIS — T451X5A Adverse effect of antineoplastic and immunosuppressive drugs, initial encounter: Secondary | ICD-10-CM

## 2014-11-03 ENCOUNTER — Ambulatory Visit (HOSPITAL_BASED_OUTPATIENT_CLINIC_OR_DEPARTMENT_OTHER): Payer: Medicare Other

## 2014-11-03 VITALS — BP 126/67 | HR 63 | Temp 98.2°F | Resp 18

## 2014-11-03 DIAGNOSIS — D649 Anemia, unspecified: Secondary | ICD-10-CM

## 2014-11-03 LAB — PREPARE RBC (CROSSMATCH)

## 2014-11-03 MED ORDER — SODIUM CHLORIDE 0.9 % IV SOLN
250.0000 mL | Freq: Once | INTRAVENOUS | Status: AC
  Administered 2014-11-03: 250 mL via INTRAVENOUS

## 2014-11-03 MED ORDER — DIPHENHYDRAMINE HCL 25 MG PO CAPS
ORAL_CAPSULE | ORAL | Status: AC
  Filled 2014-11-03: qty 1

## 2014-11-03 MED ORDER — ACETAMINOPHEN 325 MG PO TABS
650.0000 mg | ORAL_TABLET | Freq: Once | ORAL | Status: AC
  Administered 2014-11-03: 650 mg via ORAL

## 2014-11-03 MED ORDER — ACETAMINOPHEN 325 MG PO TABS
ORAL_TABLET | ORAL | Status: AC
  Filled 2014-11-03: qty 2

## 2014-11-03 MED ORDER — DIPHENHYDRAMINE HCL 25 MG PO CAPS
25.0000 mg | ORAL_CAPSULE | Freq: Once | ORAL | Status: AC
  Administered 2014-11-03: 25 mg via ORAL

## 2014-11-03 NOTE — Patient Instructions (Signed)

## 2014-11-04 LAB — TYPE AND SCREEN
ABO/RH(D): O POS
Antibody Screen: NEGATIVE
Unit division: 0
Unit division: 0

## 2014-11-06 ENCOUNTER — Encounter: Payer: Self-pay | Admitting: Internal Medicine

## 2014-11-10 ENCOUNTER — Encounter: Payer: Self-pay | Admitting: Internal Medicine

## 2014-12-04 ENCOUNTER — Other Ambulatory Visit (HOSPITAL_BASED_OUTPATIENT_CLINIC_OR_DEPARTMENT_OTHER): Payer: Medicare Other

## 2014-12-04 DIAGNOSIS — D473 Essential (hemorrhagic) thrombocythemia: Secondary | ICD-10-CM

## 2014-12-04 LAB — CBC WITH DIFFERENTIAL/PLATELET
BASO%: 1.4 % (ref 0.0–2.0)
Basophils Absolute: 0.1 10*3/uL (ref 0.0–0.1)
EOS%: 2.3 % (ref 0.0–7.0)
Eosinophils Absolute: 0.1 10*3/uL (ref 0.0–0.5)
HCT: 26.8 % — ABNORMAL LOW (ref 38.4–49.9)
HEMOGLOBIN: 8.9 g/dL — AB (ref 13.0–17.1)
LYMPH#: 1 10*3/uL (ref 0.9–3.3)
LYMPH%: 20.9 % (ref 14.0–49.0)
MCH: 37.3 pg — ABNORMAL HIGH (ref 27.2–33.4)
MCHC: 33.1 g/dL (ref 32.0–36.0)
MCV: 112.7 fL — ABNORMAL HIGH (ref 79.3–98.0)
MONO#: 0.4 10*3/uL (ref 0.1–0.9)
MONO%: 9.1 % (ref 0.0–14.0)
NEUT%: 66.3 % (ref 39.0–75.0)
NEUTROS ABS: 3.1 10*3/uL (ref 1.5–6.5)
PLATELETS: 505 10*3/uL — AB (ref 140–400)
RBC: 2.38 10*6/uL — ABNORMAL LOW (ref 4.20–5.82)
RDW: 33.2 % — AB (ref 11.0–14.6)
WBC: 4.6 10*3/uL (ref 4.0–10.3)

## 2014-12-04 LAB — COMPREHENSIVE METABOLIC PANEL (CC13)
ALBUMIN: 4 g/dL (ref 3.5–5.0)
ALK PHOS: 70 U/L (ref 40–150)
ALT: 17 U/L (ref 0–55)
AST: 13 U/L (ref 5–34)
Anion Gap: 6 mEq/L (ref 3–11)
BILIRUBIN TOTAL: 0.91 mg/dL (ref 0.20–1.20)
BUN: 27.6 mg/dL — ABNORMAL HIGH (ref 7.0–26.0)
CO2: 25 mEq/L (ref 22–29)
CREATININE: 0.8 mg/dL (ref 0.7–1.3)
Calcium: 9.2 mg/dL (ref 8.4–10.4)
Chloride: 111 mEq/L — ABNORMAL HIGH (ref 98–109)
GLUCOSE: 98 mg/dL (ref 70–140)
Potassium: 4.4 mEq/L (ref 3.5–5.1)
Sodium: 141 mEq/L (ref 136–145)
TOTAL PROTEIN: 6.7 g/dL (ref 6.4–8.3)

## 2014-12-04 LAB — LACTATE DEHYDROGENASE (CC13): LDH: 190 U/L (ref 125–245)

## 2014-12-30 ENCOUNTER — Telehealth: Payer: Self-pay | Admitting: Internal Medicine

## 2014-12-30 ENCOUNTER — Ambulatory Visit (HOSPITAL_BASED_OUTPATIENT_CLINIC_OR_DEPARTMENT_OTHER): Payer: Medicare Other | Admitting: Internal Medicine

## 2014-12-30 ENCOUNTER — Encounter: Payer: Self-pay | Admitting: Internal Medicine

## 2014-12-30 ENCOUNTER — Other Ambulatory Visit (HOSPITAL_BASED_OUTPATIENT_CLINIC_OR_DEPARTMENT_OTHER): Payer: Medicare Other

## 2014-12-30 VITALS — BP 142/73 | HR 98 | Temp 97.9°F | Resp 18 | Ht 67.25 in | Wt 159.8 lb

## 2014-12-30 DIAGNOSIS — D473 Essential (hemorrhagic) thrombocythemia: Secondary | ICD-10-CM

## 2014-12-30 LAB — COMPREHENSIVE METABOLIC PANEL (CC13)
ALK PHOS: 57 U/L (ref 40–150)
ALT: 16 U/L (ref 0–55)
ANION GAP: 6 meq/L (ref 3–11)
AST: 15 U/L (ref 5–34)
Albumin: 4.1 g/dL (ref 3.5–5.0)
BUN: 19.6 mg/dL (ref 7.0–26.0)
CALCIUM: 9.1 mg/dL (ref 8.4–10.4)
CO2: 27 mEq/L (ref 22–29)
Chloride: 110 mEq/L — ABNORMAL HIGH (ref 98–109)
Creatinine: 0.8 mg/dL (ref 0.7–1.3)
Glucose: 100 mg/dl (ref 70–140)
POTASSIUM: 4.7 meq/L (ref 3.5–5.1)
Sodium: 143 mEq/L (ref 136–145)
Total Bilirubin: 0.9 mg/dL (ref 0.20–1.20)
Total Protein: 6.8 g/dL (ref 6.4–8.3)

## 2014-12-30 LAB — CBC WITH DIFFERENTIAL/PLATELET
BASO%: 0.5 % (ref 0.0–2.0)
Basophils Absolute: 0 10*3/uL (ref 0.0–0.1)
EOS%: 2.7 % (ref 0.0–7.0)
Eosinophils Absolute: 0.1 10*3/uL (ref 0.0–0.5)
HEMATOCRIT: 26.6 % — AB (ref 38.4–49.9)
HGB: 8.8 g/dL — ABNORMAL LOW (ref 13.0–17.1)
LYMPH%: 27 % (ref 14.0–49.0)
MCH: 36.1 pg — AB (ref 27.2–33.4)
MCHC: 33.1 g/dL (ref 32.0–36.0)
MCV: 109 fL — AB (ref 79.3–98.0)
MONO#: 0.5 10*3/uL (ref 0.1–0.9)
MONO%: 10.8 % (ref 0.0–14.0)
NEUT#: 2.6 10*3/uL (ref 1.5–6.5)
NEUT%: 59 % (ref 39.0–75.0)
Platelets: 570 10*3/uL — ABNORMAL HIGH (ref 140–400)
RBC: 2.44 10*6/uL — ABNORMAL LOW (ref 4.20–5.82)
WBC: 4.4 10*3/uL (ref 4.0–10.3)
lymph#: 1.2 10*3/uL (ref 0.9–3.3)
nRBC: 1 % — ABNORMAL HIGH (ref 0–0)

## 2014-12-30 LAB — LACTATE DEHYDROGENASE (CC13): LDH: 202 U/L (ref 125–245)

## 2014-12-30 NOTE — Telephone Encounter (Signed)
per pof to sch pt appt-gave pt copy of avs °

## 2014-12-30 NOTE — Progress Notes (Signed)
[place]  Telephone:(336) [phone] Fax:(336) [phone]  OFFICE PROGRESS NOTE  [name], MD [place] [address]  DIAGNOSIS: 1.  Essential thrombocythemia with positive JAK2 mutation 2. Leukocytosis.   PRIOR THERAPY: Previous treatment with combination of Hydrea and anagrelide.  CURRENT THERAPY: Hydrea 1000 mg alternating with 500 mg by mouth every other day.   INTERVAL HISTORY: Roy Johnson 69 y.o. male returns for a scheduled regular office visit for follow up. The patient is doing well today except for fatigue and abdominal distention. He is tolerating his treatment with Hydrea fairly well with no significant adverse effects except for the fatigue. He denied having any significant bleeding, bruises or ecchymosis. The patient denied having any significant fever or chills. He has no nausea or vomiting. He has no significant weight loss or night sweats. He has repeat CBC performed earlier today and he is here for evaluation and discussion of his lab results.   MEDICAL HISTORY: Past Medical History  Diagnosis Date  . Hyperlipidemia   . Anemia   . Prostate cancer                 . Colon polyp   . Diverticulosis   . GERD (gastroesophageal reflux disease)   . Inguinal hernia   . Umbilical hernia   . Diverticulitis   . Prostate cancer   . Vitamin D deficiency     ALLERGIES:  has No Known Allergies.  MEDICATIONS:  Current Outpatient Prescriptions  Medication Sig Dispense Refill  . hydroxyurea (HYDREA) 500 MG capsule Take 2 capsules (1,000 mg total) by mouth daily. May take with food. 120 capsule 1  . ferrous sulfate 325 (65 FE) MG tablet Take 325 mg by mouth daily with breakfast.    . hyoscyamine (LEVSIN SL) 0.125 MG SL tablet Place 1 tablet (0.125 mg total) under the tongue every 4 (four) hours as needed. (Patient not taking: Reported on 12/30/2014) 30 tablet 3   No current facility-administered medications for this visit.     SURGICAL HISTORY:  Past Surgical History  Procedure Laterality Date  . Prostatectomy    . Tonsillectomy    . Colonoscopy    . Polypectomy      REVIEW OF SYSTEMS:  A comprehensive review of systems was negative except for: Constitutional: positive for fatigue   PHYSICAL EXAMINATION: General appearance: alert, cooperative, appears stated age and no distress Head: Normocephalic, without obvious abnormality, atraumatic Neck: no adenopathy, no carotid bruit, no JVD, supple, symmetrical, trachea midline and thyroid not enlarged, symmetric, no tenderness/mass/nodules Lymph nodes: Cervical, supraclavicular, and axillary nodes normal. Resp: clear to auscultation bilaterally Back: symmetric, no curvature. ROM normal. No CVA tenderness. Cardio: regular rate and rhythm, S1, S2 normal, no murmur, click, rub or gallop GI: mild left lower quadrant tenderness without rebound or referred pain, remainder the abdomen is soft nontender no appreciable organomegaly, bowel sounds present and normally active in all quadrants Extremities: extremities normal, atraumatic, no cyanosis or edema Neurologic: Alert and oriented X 3, normal strength and tone. Normal symmetric reflexes. Normal coordination and gait  ECOG PERFORMANCE STATUS: 1 - Symptomatic but completely ambulatory  Blood pressure 142/73, pulse 98, temperature 97.9 F (36.6 C), temperature source Oral, resp. rate 18, height 5' 7.25" (1.708 m), weight 159 lb 12.8 oz (72.485 kg), SpO2 100 %.  LABORATORY DATA: Lab Results  Component Value Date   WBC 4.4 12/30/2014   HGB 8.8* 12/30/2014   HCT 26.6* 12/30/2014   MCV 109.0* 12/30/2014  PLT 570* 12/30/2014      Chemistry      Component Value Date/Time   NA 143 12/30/2014 0809   K 4.7 12/30/2014 0809   CO2 27 12/30/2014 0809   BUN 19.6 12/30/2014 0809   CREATININE 0.8 12/30/2014 0809      Component Value Date/Time   CALCIUM 9.1 12/30/2014 0809   ALKPHOS 57 12/30/2014 0809   AST 15  12/30/2014 0809   ALT 16 12/30/2014 0809   BILITOT 0.90 12/30/2014 0809      RADIOGRAPHIC STUDIES:  ASSESSMENT AND PLAN:  This is a very pleasant 69 years old recently diagnosed with essential thrombocythemia with positive JAK-2 mutation.  He is currently on hydroxyurea to 1000 mg alternating with 500 mg every other day. He is tolerating his treatment well except for the fatigue. His platelets count up to 570,000 but his hemoglobin and hematocrit are also improved compared to 2 months ago. He still has anemia He would come back for followup visit in 2 month for reevaluation with repeat blood work. He was advised to call immediately if he has any concerning symptoms in the interval.  All questions were answered. The patient knows to call the clinic with any problems, questions or concerns. We can certainly see the patient much sooner if necessary.  Disclaimer: This note was dictated with voice recognition software. Similar sounding words can inadvertently be transcribed and may not be corrected upon review.

## 2015-01-21 ENCOUNTER — Other Ambulatory Visit: Payer: Self-pay | Admitting: Internal Medicine

## 2015-01-21 DIAGNOSIS — R1011 Right upper quadrant pain: Secondary | ICD-10-CM

## 2015-01-22 ENCOUNTER — Other Ambulatory Visit: Payer: Self-pay | Admitting: *Deleted

## 2015-01-22 DIAGNOSIS — D473 Essential (hemorrhagic) thrombocythemia: Secondary | ICD-10-CM

## 2015-01-22 MED ORDER — HYDROXYUREA 500 MG PO CAPS: 500.0000 mg | ORAL_CAPSULE | Freq: Every day | ORAL | Status: DC

## 2015-01-28 ENCOUNTER — Other Ambulatory Visit: Payer: Self-pay | Admitting: Medical Oncology

## 2015-01-28 DIAGNOSIS — D473 Essential (hemorrhagic) thrombocythemia: Secondary | ICD-10-CM

## 2015-01-29 ENCOUNTER — Other Ambulatory Visit (HOSPITAL_BASED_OUTPATIENT_CLINIC_OR_DEPARTMENT_OTHER): Payer: Medicare Other

## 2015-01-29 DIAGNOSIS — D473 Essential (hemorrhagic) thrombocythemia: Secondary | ICD-10-CM | POA: Diagnosis not present

## 2015-01-29 LAB — CBC WITH DIFFERENTIAL/PLATELET
BASO%: 1.2 % (ref 0.0–2.0)
BASOS ABS: 0.1 10*3/uL (ref 0.0–0.1)
EOS%: 2.5 % (ref 0.0–7.0)
Eosinophils Absolute: 0.1 10*3/uL (ref 0.0–0.5)
HCT: 28.6 % — ABNORMAL LOW (ref 38.4–49.9)
HGB: 9.4 g/dL — ABNORMAL LOW (ref 13.0–17.1)
LYMPH%: 19 % (ref 14.0–49.0)
MCH: 38.4 pg — AB (ref 27.2–33.4)
MCHC: 32.8 g/dL (ref 32.0–36.0)
MCV: 116.8 fL — AB (ref 79.3–98.0)
MONO#: 0.4 10*3/uL (ref 0.1–0.9)
MONO%: 8.8 % (ref 0.0–14.0)
NEUT#: 3.4 10*3/uL (ref 1.5–6.5)
NEUT%: 68.5 % (ref 39.0–75.0)
Platelets: 575 10*3/uL — ABNORMAL HIGH (ref 140–400)
RBC: 2.45 10*6/uL — AB (ref 4.20–5.82)
RDW: 30.8 % — ABNORMAL HIGH (ref 11.0–14.6)
WBC: 5 10*3/uL (ref 4.0–10.3)
lymph#: 1 10*3/uL (ref 0.9–3.3)

## 2015-01-29 LAB — COMPREHENSIVE METABOLIC PANEL (CC13)
ALT: 17 U/L (ref 0–55)
AST: 15 U/L (ref 5–34)
Albumin: 4 g/dL (ref 3.5–5.0)
Alkaline Phosphatase: 60 U/L (ref 40–150)
Anion Gap: 4 mEq/L (ref 3–11)
BUN: 24.1 mg/dL (ref 7.0–26.0)
CHLORIDE: 111 meq/L — AB (ref 98–109)
CO2: 26 meq/L (ref 22–29)
Calcium: 9.5 mg/dL (ref 8.4–10.4)
Creatinine: 0.8 mg/dL (ref 0.7–1.3)
EGFR: 90 mL/min/{1.73_m2} — AB (ref >90)
Glucose: 105 mg/dl (ref 70–140)
POTASSIUM: 4.7 meq/L (ref 3.5–5.1)
SODIUM: 141 meq/L (ref 136–145)
Total Bilirubin: 0.8 mg/dL (ref 0.20–1.20)
Total Protein: 6.9 g/dL (ref 6.4–8.3)

## 2015-03-03 ENCOUNTER — Encounter: Payer: Self-pay | Admitting: Internal Medicine

## 2015-03-03 ENCOUNTER — Other Ambulatory Visit: Payer: Self-pay | Admitting: Internal Medicine

## 2015-03-03 ENCOUNTER — Other Ambulatory Visit (HOSPITAL_BASED_OUTPATIENT_CLINIC_OR_DEPARTMENT_OTHER): Payer: Medicare Other

## 2015-03-03 ENCOUNTER — Telehealth: Payer: Self-pay | Admitting: Internal Medicine

## 2015-03-03 ENCOUNTER — Ambulatory Visit (HOSPITAL_BASED_OUTPATIENT_CLINIC_OR_DEPARTMENT_OTHER): Payer: Medicare Other | Admitting: Internal Medicine

## 2015-03-03 VITALS — BP 144/67 | HR 70 | Temp 98.1°F | Resp 18 | Ht 67.25 in | Wt 161.5 lb

## 2015-03-03 DIAGNOSIS — D473 Essential (hemorrhagic) thrombocythemia: Secondary | ICD-10-CM

## 2015-03-03 LAB — COMPREHENSIVE METABOLIC PANEL (CC13)
ALT: 16 U/L (ref 0–55)
ANION GAP: 5 meq/L (ref 3–11)
AST: 15 U/L (ref 5–34)
Albumin: 4.1 g/dL (ref 3.5–5.0)
Alkaline Phosphatase: 55 U/L (ref 40–150)
BILIRUBIN TOTAL: 0.93 mg/dL (ref 0.20–1.20)
BUN: 19.1 mg/dL (ref 7.0–26.0)
CO2: 26 mEq/L (ref 22–29)
CREATININE: 0.8 mg/dL (ref 0.7–1.3)
Calcium: 9.5 mg/dL (ref 8.4–10.4)
Chloride: 111 mEq/L — ABNORMAL HIGH (ref 98–109)
EGFR: >90 mL/min/{1.73_m2} (ref >90)
Glucose: 97 mg/dl (ref 70–140)
Potassium: 4.7 mEq/L (ref 3.5–5.1)
Sodium: 143 mEq/L (ref 136–145)
TOTAL PROTEIN: 7 g/dL (ref 6.4–8.3)

## 2015-03-03 LAB — LACTATE DEHYDROGENASE (CC13): LDH: 193 U/L (ref 125–245)

## 2015-03-03 LAB — CBC WITH DIFFERENTIAL/PLATELET
BASO%: 1.9 % (ref 0.0–2.0)
Basophils Absolute: 0.1 10*3/uL (ref 0.0–0.1)
EOS%: 2.1 % (ref 0.0–7.0)
Eosinophils Absolute: 0.1 10*3/uL (ref 0.0–0.5)
HEMATOCRIT: 29.8 % — AB (ref 38.4–49.9)
HGB: 9.8 g/dL — ABNORMAL LOW (ref 13.0–17.1)
LYMPH#: 1 10*3/uL (ref 0.9–3.3)
LYMPH%: 19.6 % (ref 14.0–49.0)
MCH: 38.3 pg — ABNORMAL HIGH (ref 27.2–33.4)
MCHC: 32.9 g/dL (ref 32.0–36.0)
MCV: 116.4 fL — ABNORMAL HIGH (ref 79.3–98.0)
MONO#: 0.5 10*3/uL (ref 0.1–0.9)
MONO%: 10.5 % (ref 0.0–14.0)
NEUT%: 65.9 % (ref 39.0–75.0)
NEUTROS ABS: 3.3 10*3/uL (ref 1.5–6.5)
PLATELETS: 612 10*3/uL — AB (ref 140–400)
RBC: 2.56 10*6/uL — AB (ref 4.20–5.82)
RDW: 27.1 % — AB (ref 11.0–14.6)
WBC: 5 10*3/uL (ref 4.0–10.3)

## 2015-03-03 MED ORDER — ANAGRELIDE HCL 0.5 MG PO CAPS: 0.5000 mg | ORAL_CAPSULE | Freq: Two times a day (BID) | ORAL | Status: DC

## 2015-03-03 NOTE — Telephone Encounter (Signed)
Gave and printed appt sched and avs for pt for April 2017 °

## 2015-03-03 NOTE — Progress Notes (Signed)
[place]  Telephone:(336) [phone] Fax:(336) [phone]  OFFICE PROGRESS NOTE  [name], MD [place] [address]  DIAGNOSIS: 1.  Essential thrombocythemia with positive JAK2 mutation 2. Leukocytosis.   PRIOR THERAPY: Previous treatment with combination of Hydrea and anagrelide.  CURRENT THERAPY: Hydrea 1000 mg alternating with 500 mg by mouth every other day.   INTERVAL HISTORY: Roy Johnson 69 y.o. male returns for a scheduled regular office visit for follow up. The patient is doing well today except for fatigue. He is tolerating his treatment with Hydrea fairly well with no significant adverse effects except for the fatigue. He denied having any significant bleeding, bruises or ecchymosis. The patient denied having any significant fever or chills. He has no nausea or vomiting. He has no significant weight loss or night sweats. He has repeat CBC performed earlier today and he is here for evaluation and discussion of his lab results.   MEDICAL HISTORY: Past Medical History  Diagnosis Date  . Hyperlipidemia   . Anemia   . Prostate cancer ([place])                 . Colon polyp   . Diverticulosis   . GERD (gastroesophageal reflux disease)   . Inguinal hernia   . Umbilical hernia   . Diverticulitis   . Prostate cancer ([place])   . Vitamin D deficiency     ALLERGIES:  has No Known Allergies.  MEDICATIONS:  Current Outpatient Prescriptions  Medication Sig Dispense Refill  . ferrous sulfate 325 (65 FE) MG tablet Take 325 mg by mouth daily with breakfast.    . hydroxyurea (HYDREA) 500 MG capsule Take 1 capsule (500 mg total) by mouth daily. May take with food. Alternates 1000mg  (2 capsules) with 500 mg (1 capsule) daily 120 capsule 1  . hyoscyamine (LEVSIN SL) 0.125 MG SL tablet Place 1 tablet (0.125 mg total) under the tongue every 4 (four) hours as needed. 30 tablet 3  . metoCLOPramide (REGLAN) 5 MG tablet take 1 tablet by mouth once  daily if needed for BLOATING AND REFLUX  0   No current facility-administered medications for this visit.    SURGICAL HISTORY:  Past Surgical History  Procedure Laterality Date  . Prostatectomy    . Tonsillectomy    . Colonoscopy    . Polypectomy      REVIEW OF SYSTEMS:  A comprehensive review of systems was negative except for: Constitutional: positive for fatigue   PHYSICAL EXAMINATION: General appearance: alert, cooperative, appears stated age and no distress Head: Normocephalic, without obvious abnormality, atraumatic Neck: no adenopathy, no carotid bruit, no JVD, supple, symmetrical, trachea midline and thyroid not enlarged, symmetric, no tenderness/mass/nodules Lymph nodes: Cervical, supraclavicular, and axillary nodes normal. Resp: clear to auscultation bilaterally Back: symmetric, no curvature. ROM normal. No CVA tenderness. Cardio: regular rate and rhythm, S1, S2 normal, no murmur, click, rub or gallop GI: mild left lower quadrant tenderness without rebound or referred pain, remainder the abdomen is soft nontender no appreciable organomegaly, bowel sounds present and normally active in all quadrants Extremities: extremities normal, atraumatic, no cyanosis or edema Neurologic: Alert and oriented X 3, normal strength and tone. Normal symmetric reflexes. Normal coordination and gait  ECOG PERFORMANCE STATUS: 1 - Symptomatic but completely ambulatory  Blood pressure 144/67, pulse 70, temperature 98.1 F (36.7 C), temperature source Oral, resp. rate 18, height 5' 7.25" (1.708 m), weight 161 lb 8 oz (73.256 kg), SpO2 100 %.  LABORATORY DATA: Lab  Results  Component Value Date   WBC 5.0 03/03/2015   HGB 9.8* 03/03/2015   HCT 29.8* 03/03/2015   MCV 116.4* 03/03/2015   PLT 612* 03/03/2015      Chemistry      Component Value Date/Time   NA 141 01/29/2015 0848   K 4.7 01/29/2015 0848   CO2 26 01/29/2015 0848   BUN 24.1 01/29/2015 0848   CREATININE 0.8 01/29/2015 0848       Component Value Date/Time   CALCIUM 9.5 01/29/2015 0848   ALKPHOS 60 01/29/2015 0848   AST 15 01/29/2015 0848   ALT 17 01/29/2015 0848   BILITOT 0.80 01/29/2015 0848      RADIOGRAPHIC STUDIES:  ASSESSMENT AND PLAN:  This is a very pleasant 69 years old recently diagnosed with essential thrombocythemia with positive JAK-2 mutation.  He is currently on hydroxyurea to 1000 mg alternating with 500 mg every other day. He is tolerating his treatment well except for the fatigue. His platelets count up to 612,000 but his hemoglobin and hematocrit continues to improve compared to 2 months ago. He still has anemia. He will continue treatment with hydroxyurea with the same dose for now. I also gave the patient an additional prescription for anagrelide in case he needs while in [place]. The patient is planning to travel to [place] for the next 6 months.  He would come back for followup visit in late April 2017 for reevaluation with repeat blood work. He was advised to call immediately if he has any concerning symptoms in the interval.  All questions were answered. The patient knows to call the clinic with any problems, questions or concerns. We can certainly see the patient much sooner if necessary.  Disclaimer: This note was dictated with voice recognition software. Similar sounding words can inadvertently be transcribed and may not be corrected upon review.

## 2015-03-04 ENCOUNTER — Encounter: Payer: Self-pay | Admitting: Internal Medicine

## 2015-03-11 ENCOUNTER — Telehealth: Payer: Self-pay | Admitting: Internal Medicine

## 2015-03-11 NOTE — Telephone Encounter (Signed)
sw pt and advised on DEC appt...pt ok and aware °

## 2015-03-31 ENCOUNTER — Other Ambulatory Visit (HOSPITAL_BASED_OUTPATIENT_CLINIC_OR_DEPARTMENT_OTHER): Payer: Medicare Other

## 2015-03-31 DIAGNOSIS — D473 Essential (hemorrhagic) thrombocythemia: Secondary | ICD-10-CM

## 2015-03-31 LAB — COMPREHENSIVE METABOLIC PANEL
ALT: 23 U/L (ref 0–55)
AST: 18 U/L (ref 5–34)
Albumin: 4.2 g/dL (ref 3.5–5.0)
Alkaline Phosphatase: 57 U/L (ref 40–150)
Anion Gap: 7 mEq/L (ref 3–11)
BILIRUBIN TOTAL: 0.96 mg/dL (ref 0.20–1.20)
BUN: 24 mg/dL (ref 7.0–26.0)
CO2: 24 mEq/L (ref 22–29)
CREATININE: 1 mg/dL (ref 0.7–1.3)
Calcium: 9.4 mg/dL (ref 8.4–10.4)
Chloride: 111 mEq/L — ABNORMAL HIGH (ref 98–109)
EGFR: 78 mL/min/{1.73_m2} — ABNORMAL LOW (ref >90)
Glucose: 95 mg/dl (ref 70–140)
Potassium: 4.8 mEq/L (ref 3.5–5.1)
Sodium: 142 mEq/L (ref 136–145)
Total Protein: 7.2 g/dL (ref 6.4–8.3)

## 2015-03-31 LAB — CBC WITH DIFFERENTIAL/PLATELET
BASO%: 1.6 % (ref 0.0–2.0)
Basophils Absolute: 0.1 10*3/uL (ref 0.0–0.1)
EOS ABS: 0.1 10*3/uL (ref 0.0–0.5)
EOS%: 2.7 % (ref 0.0–7.0)
HCT: 29.3 % — ABNORMAL LOW (ref 38.4–49.9)
HGB: 9.5 g/dL — ABNORMAL LOW (ref 13.0–17.1)
LYMPH%: 23.3 % (ref 14.0–49.0)
MCH: 37.2 pg — AB (ref 27.2–33.4)
MCHC: 32.4 g/dL (ref 32.0–36.0)
MCV: 115.1 fL — ABNORMAL HIGH (ref 79.3–98.0)
MONO#: 0.6 10*3/uL (ref 0.1–0.9)
MONO%: 10.6 % (ref 0.0–14.0)
NEUT%: 61.8 % (ref 39.0–75.0)
NEUTROS ABS: 3.3 10*3/uL (ref 1.5–6.5)
Platelets: 545 10*3/uL — ABNORMAL HIGH (ref 140–400)
RBC: 2.54 10*6/uL — AB (ref 4.20–5.82)
RDW: 28.9 % — ABNORMAL HIGH (ref 11.0–14.6)
WBC: 5.3 10*3/uL (ref 4.0–10.3)
lymph#: 1.2 10*3/uL (ref 0.9–3.3)

## 2015-03-31 LAB — LACTATE DEHYDROGENASE: LDH: 204 U/L (ref 125–245)

## 2015-05-07 ENCOUNTER — Telehealth: Payer: Self-pay | Admitting: *Deleted

## 2015-05-07 NOTE — Telephone Encounter (Signed)
Called pt unable to reach. LMOVM -Per MD Continue Hydrea 1000mg  alternating 500mg  every other day

## 2015-06-01 ENCOUNTER — Other Ambulatory Visit: Payer: Self-pay | Admitting: Medical Oncology

## 2015-06-01 ENCOUNTER — Telehealth: Payer: Self-pay | Admitting: Medical Oncology

## 2015-06-01 DIAGNOSIS — D473 Essential (hemorrhagic) thrombocythemia: Secondary | ICD-10-CM

## 2015-06-01 NOTE — Telephone Encounter (Signed)
Needs lab appt here this month as he is not in [place] where he usually has monthly labs. appt given and labs ordered.

## 2015-06-03 ENCOUNTER — Other Ambulatory Visit (HOSPITAL_BASED_OUTPATIENT_CLINIC_OR_DEPARTMENT_OTHER): Payer: Medicare Other

## 2015-06-03 DIAGNOSIS — D473 Essential (hemorrhagic) thrombocythemia: Secondary | ICD-10-CM

## 2015-06-03 LAB — COMPREHENSIVE METABOLIC PANEL
ALT: 27 U/L (ref 0–55)
ANION GAP: 9 meq/L (ref 3–11)
AST: 20 U/L (ref 5–34)
Albumin: 3.8 g/dL (ref 3.5–5.0)
Alkaline Phosphatase: 67 U/L (ref 40–150)
BILIRUBIN TOTAL: 0.62 mg/dL (ref 0.20–1.20)
BUN: 22.6 mg/dL (ref 7.0–26.0)
CO2: 26 mEq/L (ref 22–29)
CREATININE: 0.9 mg/dL (ref 0.7–1.3)
Calcium: 9.3 mg/dL (ref 8.4–10.4)
Chloride: 106 mEq/L (ref 98–109)
EGFR: 88 mL/min/{1.73_m2} — ABNORMAL LOW (ref >90)
Glucose: 82 mg/dl (ref 70–140)
Potassium: 4.4 mEq/L (ref 3.5–5.1)
Sodium: 141 mEq/L (ref 136–145)
TOTAL PROTEIN: 7.2 g/dL (ref 6.4–8.3)

## 2015-06-03 LAB — CBC WITH DIFFERENTIAL/PLATELET
BASO%: 1.2 % (ref 0.0–2.0)
Basophils Absolute: 0.1 10*3/uL (ref 0.0–0.1)
EOS%: 3.3 % (ref 0.0–7.0)
Eosinophils Absolute: 0.2 10*3/uL (ref 0.0–0.5)
HEMATOCRIT: 30.2 % — AB (ref 38.4–49.9)
HGB: 9.8 g/dL — ABNORMAL LOW (ref 13.0–17.1)
LYMPH#: 1.2 10*3/uL (ref 0.9–3.3)
LYMPH%: 21.7 % (ref 14.0–49.0)
MCH: 36.4 pg — ABNORMAL HIGH (ref 27.2–33.4)
MCHC: 32.3 g/dL (ref 32.0–36.0)
MCV: 113 fL — ABNORMAL HIGH (ref 79.3–98.0)
MONO#: 0.4 10*3/uL (ref 0.1–0.9)
MONO%: 6.8 % (ref 0.0–14.0)
NEUT%: 67 % (ref 39.0–75.0)
NEUTROS ABS: 3.6 10*3/uL (ref 1.5–6.5)
PLATELETS: 422 10*3/uL — AB (ref 140–400)
RBC: 2.68 10*6/uL — AB (ref 4.20–5.82)
RDW: 28.2 % — ABNORMAL HIGH (ref 11.0–14.6)
WBC: 5.4 10*3/uL (ref 4.0–10.3)

## 2015-06-03 LAB — LACTATE DEHYDROGENASE: LDH: 176 U/L (ref 125–245)

## 2015-07-31 ENCOUNTER — Telehealth: Payer: Self-pay | Admitting: *Deleted

## 2015-07-31 DIAGNOSIS — D473 Essential (hemorrhagic) thrombocythemia: Secondary | ICD-10-CM

## 2015-07-31 MED ORDER — HYDROXYUREA 500 MG PO CAPS: 500.0000 mg | ORAL_CAPSULE | Freq: Every day | ORAL | Status: DC

## 2015-07-31 NOTE — Telephone Encounter (Signed)
VM from pt requesting refill on hydrea. Rx neds to be sent to [facility] in [facility] Ph# [phone].  RX sent e-scribed and called onto pharmacy. Called pt advised his rx has been refilled and [facility] is currently working onit. Pt thanked me for the call confirmed he will see MD next week. No further concerns.

## 2015-08-03 ENCOUNTER — Telehealth: Payer: Self-pay | Admitting: Medical Oncology

## 2015-08-03 DIAGNOSIS — D473 Essential (hemorrhagic) thrombocythemia: Secondary | ICD-10-CM

## 2015-08-03 MED ORDER — HYDROXYUREA 500 MG PO CAPS: 500.0000 mg | ORAL_CAPSULE | Freq: Every day | ORAL | Status: DC

## 2015-08-03 NOTE — Telephone Encounter (Signed)
Faxed hydrea refill to [facility]

## 2015-08-05 ENCOUNTER — Telehealth: Payer: Self-pay | Admitting: Internal Medicine

## 2015-08-05 ENCOUNTER — Encounter: Payer: Self-pay | Admitting: Internal Medicine

## 2015-08-05 ENCOUNTER — Other Ambulatory Visit (HOSPITAL_BASED_OUTPATIENT_CLINIC_OR_DEPARTMENT_OTHER): Payer: Medicare Other

## 2015-08-05 ENCOUNTER — Ambulatory Visit (HOSPITAL_BASED_OUTPATIENT_CLINIC_OR_DEPARTMENT_OTHER): Payer: Medicare Other | Admitting: Internal Medicine

## 2015-08-05 VITALS — BP 147/70 | HR 77 | Temp 98.3°F | Resp 18 | Ht 67.25 in | Wt 160.3 lb

## 2015-08-05 DIAGNOSIS — R530 Neoplastic (malignant) related fatigue: Secondary | ICD-10-CM | POA: Diagnosis not present

## 2015-08-05 DIAGNOSIS — D473 Essential (hemorrhagic) thrombocythemia: Secondary | ICD-10-CM

## 2015-08-05 DIAGNOSIS — D630 Anemia in neoplastic disease: Secondary | ICD-10-CM | POA: Diagnosis not present

## 2015-08-05 LAB — COMPREHENSIVE METABOLIC PANEL
ALT: 15 U/L (ref 0–55)
ANION GAP: 7 meq/L (ref 3–11)
AST: 13 U/L (ref 5–34)
Albumin: 4.2 g/dL (ref 3.5–5.0)
Alkaline Phosphatase: 49 U/L (ref 40–150)
BUN: 22.7 mg/dL (ref 7.0–26.0)
CHLORIDE: 109 meq/L (ref 98–109)
CO2: 25 meq/L (ref 22–29)
CREATININE: 0.9 mg/dL (ref 0.7–1.3)
Calcium: 9.2 mg/dL (ref 8.4–10.4)
EGFR: 88 mL/min/{1.73_m2} — ABNORMAL LOW (ref >90)
Glucose: 97 mg/dl (ref 70–140)
POTASSIUM: 4.5 meq/L (ref 3.5–5.1)
SODIUM: 141 meq/L (ref 136–145)
Total Bilirubin: 0.86 mg/dL (ref 0.20–1.20)
Total Protein: 7.1 g/dL (ref 6.4–8.3)

## 2015-08-05 LAB — CBC WITH DIFFERENTIAL/PLATELET
BASO%: 0.6 % (ref 0.0–2.0)
BASOS ABS: 0 10*3/uL (ref 0.0–0.1)
EOS%: 2.2 % (ref 0.0–7.0)
Eosinophils Absolute: 0.1 10*3/uL (ref 0.0–0.5)
HCT: 29.4 % — ABNORMAL LOW (ref 38.4–49.9)
HGB: 9.8 g/dL — ABNORMAL LOW (ref 13.0–17.1)
LYMPH%: 19.4 % (ref 14.0–49.0)
MCH: 34.9 pg — AB (ref 27.2–33.4)
MCHC: 33.3 g/dL (ref 32.0–36.0)
MCV: 104.6 fL — AB (ref 79.3–98.0)
MONO#: 0.7 10*3/uL (ref 0.1–0.9)
MONO%: 10.7 % (ref 0.0–14.0)
NEUT#: 4.3 10*3/uL (ref 1.5–6.5)
NEUT%: 67.1 % (ref 39.0–75.0)
Platelets: 640 10*3/uL — ABNORMAL HIGH (ref 140–400)
RBC: 2.81 10*6/uL — AB (ref 4.20–5.82)
WBC: 6.3 10*3/uL (ref 4.0–10.3)
lymph#: 1.2 10*3/uL (ref 0.9–3.3)
nRBC: 1 % — ABNORMAL HIGH (ref 0–0)

## 2015-08-05 LAB — LACTATE DEHYDROGENASE: LDH: 191 U/L (ref 125–245)

## 2015-08-05 NOTE — Progress Notes (Signed)
[place]  Telephone:(336) [phone] Fax:(336) [phone]  OFFICE PROGRESS NOTE  [name], MD [place] [address]  DIAGNOSIS: 1.  Essential thrombocythemia with positive JAK2 mutation 2. Leukocytosis.   PRIOR THERAPY: Previous treatment with combination of Hydrea and anagrelide.  CURRENT THERAPY: Hydrea 1000 mg alternating with 500 mg by mouth every other day.   INTERVAL HISTORY: Roy Johnson 70 y.o. male returns for a scheduled regular office visit for follow up. He came back from [place] after spending the winter there. The patient is doing well today except for fatigue. He is tolerating his treatment with Hydrea fairly well with no significant adverse effects except for the fatigue. He complained recently of frequent episodes of nausea as well as abdominal aches. CT scan of the abdomen performed by his primary care physician showed no concerning findings. He is scheduled to see Dr. Philip Aspen next week for his annual examination. He denied having any significant bleeding, bruises or ecchymosis. The patient denied having any significant fever or chills. He has no nausea or vomiting. He has no significant weight loss or night sweats. He has repeat CBC performed earlier today and he is here for evaluation and discussion of his lab results.   MEDICAL HISTORY: Past Medical History  Diagnosis Date  . Hyperlipidemia   . Anemia   . Prostate cancer ([place])                 . Colon polyp   . Diverticulosis   . GERD (gastroesophageal reflux disease)   . Inguinal hernia   . Umbilical hernia   . Diverticulitis   . Prostate cancer ([place])   . Vitamin D deficiency     ALLERGIES:  has No Known Allergies.  MEDICATIONS:  Current Outpatient Prescriptions  Medication Sig Dispense Refill  . anagrelide (AGRYLIN) 0.5 MG capsule Take 1 capsule (0.5 mg total) by mouth 2 (two) times daily. 60 capsule 2  . ferrous sulfate 325 (65 FE) MG tablet Take 325 mg by  mouth daily with breakfast.    . hydroxyurea (HYDREA) 500 MG capsule Take 1 capsule (500 mg total) by mouth daily. May take with food. Alternates 1000mg  (2 capsules) with 500 mg (1 capsule) daily 120 capsule 2  . hyoscyamine (LEVSIN SL) 0.125 MG SL tablet Place 1 tablet (0.125 mg total) under the tongue every 4 (four) hours as needed. 30 tablet 3  . metoCLOPramide (REGLAN) 5 MG tablet take 1 tablet by mouth once daily if needed for BLOATING AND REFLUX  0   No current facility-administered medications for this visit.    SURGICAL HISTORY:  Past Surgical History  Procedure Laterality Date  . Prostatectomy    . Tonsillectomy    . Colonoscopy    . Polypectomy      REVIEW OF SYSTEMS:  A comprehensive review of systems was negative except for: Constitutional: positive for fatigue Gastrointestinal: positive for abdominal pain and nausea   PHYSICAL EXAMINATION: General appearance: alert, cooperative, appears stated age and no distress Head: Normocephalic, without obvious abnormality, atraumatic Neck: no adenopathy, no carotid bruit, no JVD, supple, symmetrical, trachea midline and thyroid not enlarged, symmetric, no tenderness/mass/nodules Lymph nodes: Cervical, supraclavicular, and axillary nodes normal. Resp: clear to auscultation bilaterally Back: symmetric, no curvature. ROM normal. No CVA tenderness. Cardio: regular rate and rhythm, S1, S2 normal, no murmur, click, rub or gallop GI: mild left lower quadrant tenderness without rebound or referred pain, remainder the abdomen is soft nontender no appreciable organomegaly,  bowel sounds present and normally active in all quadrants Extremities: extremities normal, atraumatic, no cyanosis or edema Neurologic: Alert and oriented X 3, normal strength and tone. Normal symmetric reflexes. Normal coordination and gait  ECOG PERFORMANCE STATUS: 1 - Symptomatic but completely ambulatory  Blood pressure 147/70, pulse 77, temperature 98.3 F (36.8 C),  temperature source Oral, resp. rate 18, height 5' 7.25" (1.708 m), weight 160 lb 4.8 oz (72.712 kg), SpO2 100 %.  LABORATORY DATA: Lab Results  Component Value Date   WBC 6.3 08/05/2015   HGB 9.8* 08/05/2015   HCT 29.4* 08/05/2015   MCV 104.6* 08/05/2015   PLT 640* 08/05/2015      Chemistry      Component Value Date/Time   NA 141 06/03/2015 1328   K 4.4 06/03/2015 1328   CO2 26 06/03/2015 1328   BUN 22.6 06/03/2015 1328   CREATININE 0.9 06/03/2015 1328      Component Value Date/Time   CALCIUM 9.3 06/03/2015 1328   ALKPHOS 67 06/03/2015 1328   AST 20 06/03/2015 1328   ALT 27 06/03/2015 1328   BILITOT 0.62 06/03/2015 1328      RADIOGRAPHIC STUDIES:  ASSESSMENT AND PLAN:  This is a very pleasant 70 years old recently diagnosed with essential thrombocythemia with positive JAK-2 mutation.  He is currently on hydroxyurea to 1000 mg alternating with 500 mg every other day. He is tolerating his treatment well except for the fatigue. His platelets count up to 640,000 and he continues to have anemia.  He will continue treatment with hydroxyurea  but I would increase the dose to 1000 mg by mouth daily.  I also discussed with the patient referral to Dr. Royce Macadamia at [facility] for a second opinion. One other treatment option is to consider the patient for treatment with Jakafi. I will continue to monitor his CBC on monthly basis. I will see the patient back for follow-up visit in 3 months for evaluation and repeat CBC, comprehensive metabolic panel and LDH.  He was advised to call immediately if he has any concerning symptoms in the interval.  All questions were answered. The patient knows to call the clinic with any problems, questions or concerns. We can certainly see the patient much sooner if necessary.  Disclaimer: This note was dictated with voice recognition software. Similar sounding words can inadvertently be transcribed and may not be corrected upon review.

## 2015-08-05 NOTE — Telephone Encounter (Signed)
Gave and printed appt sched for May thru July..I sent msg to HIM to send records to [facility]

## 2015-08-07 ENCOUNTER — Telehealth: Payer: Self-pay | Admitting: Internal Medicine

## 2015-08-07 NOTE — Telephone Encounter (Signed)
Faxed pt medical records to [facility].  [facility] will call pt with appt.

## 2015-09-03 ENCOUNTER — Other Ambulatory Visit (HOSPITAL_BASED_OUTPATIENT_CLINIC_OR_DEPARTMENT_OTHER): Payer: Medicare Other

## 2015-09-03 DIAGNOSIS — D473 Essential (hemorrhagic) thrombocythemia: Secondary | ICD-10-CM

## 2015-09-03 LAB — CBC WITH DIFFERENTIAL/PLATELET
BASO%: 0.7 % (ref 0.0–2.0)
Basophils Absolute: 0.1 10*3/uL (ref 0.0–0.1)
EOS ABS: 0.1 10*3/uL (ref 0.0–0.5)
EOS%: 1.6 % (ref 0.0–7.0)
HCT: 23.1 % — ABNORMAL LOW (ref 38.4–49.9)
HGB: 7.8 g/dL — ABNORMAL LOW (ref 13.0–17.1)
LYMPH%: 14 % (ref 14.0–49.0)
MCH: 35.8 pg — AB (ref 27.2–33.4)
MCHC: 33.8 g/dL (ref 32.0–36.0)
MCV: 106 fL — ABNORMAL HIGH (ref 79.3–98.0)
MONO#: 0.7 10*3/uL (ref 0.1–0.9)
MONO%: 10.4 % (ref 0.0–14.0)
NEUT%: 73.3 % (ref 39.0–75.0)
NEUTROS ABS: 5.1 10*3/uL (ref 1.5–6.5)
PLATELETS: 696 10*3/uL — AB (ref 140–400)
RBC: 2.18 10*6/uL — AB (ref 4.20–5.82)
RDW: 28 % — ABNORMAL HIGH (ref 11.0–14.6)
WBC: 6.9 10*3/uL (ref 4.0–10.3)
lymph#: 1 10*3/uL (ref 0.9–3.3)
nRBC: 1 % — ABNORMAL HIGH (ref 0–0)

## 2015-09-03 NOTE — Progress Notes (Signed)
Quick Note:  Call patient with the result and arrange for 2 units of PRBCs transfusion. ______ 

## 2015-09-04 ENCOUNTER — Telehealth: Payer: Self-pay | Admitting: Internal Medicine

## 2015-09-04 ENCOUNTER — Telehealth: Payer: Self-pay | Admitting: Medical Oncology

## 2015-09-04 ENCOUNTER — Ambulatory Visit (HOSPITAL_COMMUNITY)
Admission: RE | Admit: 2015-09-04 | Discharge: 2015-09-04 | Disposition: A | Discharge status: Home / Self Care | Payer: Medicare Other | Source: Ambulatory Visit | Attending: Internal Medicine | Admitting: Internal Medicine

## 2015-09-04 ENCOUNTER — Other Ambulatory Visit: Payer: Medicare Other

## 2015-09-04 ENCOUNTER — Other Ambulatory Visit: Payer: Self-pay | Admitting: Medical Oncology

## 2015-09-04 DIAGNOSIS — D649 Anemia, unspecified: Secondary | ICD-10-CM | POA: Insufficient documentation

## 2015-09-04 NOTE — Telephone Encounter (Signed)
Pt will get sat apt when he comes for lab per pof

## 2015-09-04 NOTE — Telephone Encounter (Deleted)
I left a message.

## 2015-09-04 NOTE — Telephone Encounter (Signed)
-----   Message from Curt Bears, MD sent at 09/03/2015  6:31 PM EDT ----- Call patient with the result and arrange for 2 units of PRBCs transfusion.

## 2015-09-04 NOTE — Telephone Encounter (Signed)
Pt contacted about blood transfusion.

## 2015-09-04 NOTE — Progress Notes (Signed)
HAR done

## 2015-09-05 ENCOUNTER — Ambulatory Visit: Payer: Medicare Other

## 2015-09-05 VITALS — BP 135/75 | HR 57 | Temp 98.1°F | Resp 14

## 2015-09-05 DIAGNOSIS — D649 Anemia, unspecified: Secondary | ICD-10-CM | POA: Diagnosis not present

## 2015-09-05 LAB — PREPARE RBC (CROSSMATCH)

## 2015-09-05 MED ORDER — ACETAMINOPHEN 325 MG PO TABS
ORAL_TABLET | ORAL | Status: AC
  Filled 2015-09-05: qty 2

## 2015-09-05 MED ORDER — ACETAMINOPHEN 325 MG PO TABS
650.0000 mg | ORAL_TABLET | Freq: Once | ORAL | Status: AC
  Administered 2015-09-05: 650 mg via ORAL

## 2015-09-05 MED ORDER — SODIUM CHLORIDE 0.9 % IV SOLN
250.0000 mL | Freq: Once | INTRAVENOUS | Status: AC
  Administered 2015-09-05: 250 mL via INTRAVENOUS

## 2015-09-05 MED ORDER — DIPHENHYDRAMINE HCL 25 MG PO CAPS
25.0000 mg | ORAL_CAPSULE | Freq: Once | ORAL | Status: AC
  Administered 2015-09-05: 25 mg via ORAL

## 2015-09-05 MED ORDER — DIPHENHYDRAMINE HCL 25 MG PO CAPS
ORAL_CAPSULE | ORAL | Status: AC
  Filled 2015-09-05: qty 1

## 2015-09-05 NOTE — Patient Instructions (Signed)

## 2015-09-07 LAB — TYPE AND SCREEN
ABO/RH(D): O POS
ANTIBODY SCREEN: NEGATIVE
UNIT DIVISION: 0
Unit division: 0

## 2015-09-30 ENCOUNTER — Encounter: Payer: Self-pay | Admitting: Internal Medicine

## 2015-10-01 ENCOUNTER — Other Ambulatory Visit (HOSPITAL_BASED_OUTPATIENT_CLINIC_OR_DEPARTMENT_OTHER): Payer: Medicare Other

## 2015-10-01 DIAGNOSIS — D473 Essential (hemorrhagic) thrombocythemia: Secondary | ICD-10-CM | POA: Diagnosis not present

## 2015-10-01 LAB — CBC WITH DIFFERENTIAL/PLATELET
BASO%: 0.6 % (ref 0.0–2.0)
Basophils Absolute: 0 10*3/uL (ref 0.0–0.1)
EOS ABS: 0.2 10*3/uL (ref 0.0–0.5)
EOS%: 3.4 % (ref 0.0–7.0)
HEMATOCRIT: 24.8 % — AB (ref 38.4–49.9)
HGB: 8.2 g/dL — ABNORMAL LOW (ref 13.0–17.1)
LYMPH#: 1.1 10*3/uL (ref 0.9–3.3)
LYMPH%: 23.6 % (ref 14.0–49.0)
MCH: 34 pg — ABNORMAL HIGH (ref 27.2–33.4)
MCHC: 33.1 g/dL (ref 32.0–36.0)
MCV: 102.9 fL — ABNORMAL HIGH (ref 79.3–98.0)
MONO#: 0.6 10*3/uL (ref 0.1–0.9)
MONO%: 12 % (ref 0.0–14.0)
NEUT%: 60.4 % (ref 39.0–75.0)
NEUTROS ABS: 2.8 10*3/uL (ref 1.5–6.5)
NRBC: 1 % — AB (ref 0–0)
PLATELETS: 602 10*3/uL — AB (ref 140–400)
RBC: 2.41 10*6/uL — AB (ref 4.20–5.82)
WBC: 4.7 10*3/uL (ref 4.0–10.3)

## 2015-10-19 ENCOUNTER — Ambulatory Visit (INDEPENDENT_AMBULATORY_CARE_PROVIDER_SITE_OTHER): Payer: Medicare Other | Admitting: Internal Medicine

## 2015-10-19 ENCOUNTER — Encounter: Payer: Self-pay | Admitting: Internal Medicine

## 2015-10-19 VITALS — BP 110/74 | HR 100 | Ht 67.25 in | Wt 161.4 lb

## 2015-10-19 DIAGNOSIS — R112 Nausea with vomiting, unspecified: Secondary | ICD-10-CM | POA: Diagnosis not present

## 2015-10-19 DIAGNOSIS — R109 Unspecified abdominal pain: Secondary | ICD-10-CM | POA: Diagnosis not present

## 2015-10-19 DIAGNOSIS — R194 Change in bowel habit: Secondary | ICD-10-CM

## 2015-10-19 MED ORDER — HYOSCYAMINE SULFATE 0.125 MG SL SUBL: 0.1250 mg | SUBLINGUAL_TABLET | SUBLINGUAL | Status: DC | PRN

## 2015-10-19 NOTE — Patient Instructions (Signed)
We have sent the following medications to your pharmacy for you to pick up at your convenience:  Levsin  

## 2015-10-19 NOTE — Progress Notes (Signed)
HISTORY OF PRESENT ILLNESS:  Roy Johnson is a 70 y.o. male with a history of gastric cancer status post prostatectomy, diverticulosis complicated by diverticulitis, colon polyps, blood dyscrasia, and irritable bowel syndrome. Patient was evaluated in December 2014 for lower abdominal complaints and most recently June 2015 for the same. He presents today regarding the same. He describes episodic cramping lower abdominal pain followed by diaphoresis, nausea, and occasional vomiting. Subsequent diarrhea. Problems last about 90 minutes and resolved. 2 years ago he reported a frequency of every 6-8 weeks. Currently a frequency very 4-6 weeks. No other changes. Overall he describes his bowels as "ball-like". He was placed on Metamucil daily. He has been taking this for about 2 months. His bowel consistency has improved. He did have one more episode of lower abdominal discomfort which was less intense. He has had no bleeding. His index colonoscopy in 2003 with non-adenomatous colon polyp. 2007 with diminutive polyp (no pathology), and most recently April 2013 with severe diverticulosis but otherwise normal. Follow-up in 10 years recommended. Patient was prescribed Levsin sublingual previously. Start clears he was able to use it or had available at the time of an attack. Metamucil does seem to make him a bit bloated or gassy.  REVIEW OF SYSTEMS:  All non-GI ROS negative upon review  Past Medical History  Diagnosis Date  . Hyperlipidemia   . Anemia   . Prostate cancer ([place])                 . Colon polyp   . Diverticulosis   . GERD (gastroesophageal reflux disease)   . Inguinal hernia   . Umbilical hernia   . Diverticulitis   . Prostate cancer ([place])   . Vitamin D deficiency   . IBS (irritable bowel syndrome)     Past Surgical History  Procedure Laterality Date  . Prostatectomy    . Tonsillectomy    . Colonoscopy    . Polypectomy      Social History Roy Johnson  reports that he quit smoking  about 42 years ago. He has never used smokeless tobacco. He reports that he drinks about 3.5 oz of alcohol per week. He reports that he does not use illicit drugs.  family history includes Breast cancer in his mother; Colon polyps in his father; Heart disease in his father; Lung cancer in his father; Osteoarthritis in his mother; Prostate cancer in his father. There is no history of Colon cancer, Esophageal cancer, Rectal cancer, or Stomach cancer.  No Known Allergies     PHYSICAL EXAMINATION: Vital signs: BP 110/74 mmHg  Pulse 100  Ht 5' 7.25" (1.708 m)  Wt 161 lb 6.4 oz (73.211 kg)  BMI 25.10 kg/m2  Constitutional: generally well-appearing, no acute distress Psychiatric: alert and oriented x3, cooperative Eyes: extraocular movements intact, anicteric, conjunctiva pink Mouth: oral pharynx moist, no lesions Neck: supple no lymphadenopathy Cardiovascular: heart regular rate and rhythm, no murmur Lungs: clear to auscultation bilaterally Abdomen: soft, nontender, nondistended, no obvious ascites, no peritoneal signs, normal bowel sounds, no organomegaly Rectal:Omitted Extremities: no clubbing cyanosis or lower extremity edema bilaterally Skin: no lesions on visible extremities Neuro: No focal deficits. Cranial nerves intact  ASSESSMENT:  #1. Chronic stable episodic issues with abdominal cramping associated with loose stools and vagal symptoms. Interval bowel type or intestinal spasm-type picture without worrisome features.   PLAN:  #1. Agree with daily fiber supplementation. He may wish to try Citrucel to see if this makes it less gassy #2.  Re-prescribed Levsin sublingual. One to 2 every 4 hours as needed for cramping discomfort. He wanted to try this again #3. Routine colonoscopy 2023 #4. GI follow-up in the interim as needed. Resume care with [facility]  25 minute face-to-face with the patient. Greater than 50% of the time use for discussion regarding the etiology of his abdominal  complaints and recommendations

## 2015-10-29 ENCOUNTER — Other Ambulatory Visit (HOSPITAL_BASED_OUTPATIENT_CLINIC_OR_DEPARTMENT_OTHER): Payer: Medicare Other

## 2015-10-29 ENCOUNTER — Ambulatory Visit (HOSPITAL_COMMUNITY)
Admission: RE | Admit: 2015-10-29 | Discharge: 2015-10-29 | Disposition: A | Discharge status: Home / Self Care | Payer: Medicare Other | Source: Ambulatory Visit | Attending: Internal Medicine | Admitting: Internal Medicine

## 2015-10-29 ENCOUNTER — Ambulatory Visit (HOSPITAL_BASED_OUTPATIENT_CLINIC_OR_DEPARTMENT_OTHER): Payer: Medicare Other | Admitting: Internal Medicine

## 2015-10-29 ENCOUNTER — Encounter: Payer: Self-pay | Admitting: Internal Medicine

## 2015-10-29 ENCOUNTER — Other Ambulatory Visit: Payer: Self-pay | Admitting: Medical Oncology

## 2015-10-29 ENCOUNTER — Telehealth: Payer: Self-pay | Admitting: Internal Medicine

## 2015-10-29 ENCOUNTER — Ambulatory Visit: Payer: Medicare Other

## 2015-10-29 VITALS — BP 119/62 | HR 74 | Temp 97.6°F | Resp 18 | Ht 67.25 in | Wt 161.2 lb

## 2015-10-29 DIAGNOSIS — D469 Myelodysplastic syndrome, unspecified: Secondary | ICD-10-CM

## 2015-10-29 DIAGNOSIS — D649 Anemia, unspecified: Secondary | ICD-10-CM | POA: Diagnosis not present

## 2015-10-29 DIAGNOSIS — D630 Anemia in neoplastic disease: Secondary | ICD-10-CM

## 2015-10-29 DIAGNOSIS — D473 Essential (hemorrhagic) thrombocythemia: Secondary | ICD-10-CM | POA: Diagnosis not present

## 2015-10-29 LAB — COMPREHENSIVE METABOLIC PANEL
ALT: 20 U/L (ref 0–55)
ANION GAP: 7 meq/L (ref 3–11)
AST: 14 U/L (ref 5–34)
Albumin: 3.9 g/dL (ref 3.5–5.0)
Alkaline Phosphatase: 53 U/L (ref 40–150)
BUN: 19.7 mg/dL (ref 7.0–26.0)
CALCIUM: 9 mg/dL (ref 8.4–10.4)
CHLORIDE: 109 meq/L (ref 98–109)
CO2: 26 mEq/L (ref 22–29)
Creatinine: 0.8 mg/dL (ref 0.7–1.3)
EGFR: 90 mL/min/{1.73_m2} — AB (ref >90)
Glucose: 98 mg/dl (ref 70–140)
Potassium: 4.3 mEq/L (ref 3.5–5.1)
Sodium: 142 mEq/L (ref 136–145)
Total Bilirubin: 1.04 mg/dL (ref 0.20–1.20)
Total Protein: 6.7 g/dL (ref 6.4–8.3)

## 2015-10-29 LAB — CBC WITH DIFFERENTIAL/PLATELET
BASO%: 0.8 % (ref 0.0–2.0)
Basophils Absolute: 0 10*3/uL (ref 0.0–0.1)
EOS ABS: 0.1 10*3/uL (ref 0.0–0.5)
EOS%: 3.5 % (ref 0.0–7.0)
HEMATOCRIT: 19.5 % — AB (ref 38.4–49.9)
HGB: 6.5 g/dL — CL (ref 13.0–17.1)
LYMPH%: 28.5 % (ref 14.0–49.0)
MCH: 35.5 pg — AB (ref 27.2–33.4)
MCHC: 33.3 g/dL (ref 32.0–36.0)
MCV: 106.6 fL — ABNORMAL HIGH (ref 79.3–98.0)
MONO#: 0.4 10*3/uL (ref 0.1–0.9)
MONO%: 9.3 % (ref 0.0–14.0)
NEUT#: 2.3 10*3/uL (ref 1.5–6.5)
NEUT%: 57.9 % (ref 39.0–75.0)
PLATELETS: 519 10*3/uL — AB (ref 140–400)
RBC: 1.83 10*6/uL — ABNORMAL LOW (ref 4.20–5.82)
WBC: 4 10*3/uL (ref 4.0–10.3)
lymph#: 1.1 10*3/uL (ref 0.9–3.3)
nRBC: 0 % (ref 0–0)

## 2015-10-29 LAB — LACTATE DEHYDROGENASE: LDH: 217 U/L (ref 125–245)

## 2015-10-29 LAB — TECHNOLOGIST REVIEW

## 2015-10-29 LAB — PREPARE RBC (CROSSMATCH)

## 2015-10-29 NOTE — Progress Notes (Signed)
[place]  Telephone:(336) [phone] Fax:(336) [phone]  OFFICE PROGRESS NOTE  [name], MD [place] [address]  DIAGNOSIS: 1.  Essential thrombocythemia with positive JAK2 mutation 2. Leukocytosis.   PRIOR THERAPY: Previous treatment with combination of Hydrea and anagrelide.  CURRENT THERAPY: Hydrea 1000 mg alternating with 500 mg by mouth every other day.   INTERVAL HISTORY: Roy Johnson 70 y.o. male returns for a scheduled regular office visit for follow up. The patient is feeling fine today except for fatigue, worse with exercise. He was referred to [facility] and seen by Dr. Hulan Amato. 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 He was referred to a malignant hematologist at [facility] for evaluation and discussion of his treatment options for the myelodysplastic syndrome. He is scheduled to see him in August 2017. He is tolerating his current treatment with Hydrea fairly well. He denied having any significant bleeding, bruises or ecchymosis. The patient denied having any significant fever or chills. He has no nausea or vomiting. He has no significant weight loss or night sweats. He has repeat CBC performed earlier today and he is here for evaluation and discussion of his lab results.   MEDICAL HISTORY: Past Medical History  Diagnosis Date  . Hyperlipidemia   . Anemia   . Prostate cancer ([place])                 . Colon polyp   . Diverticulosis   . GERD (gastroesophageal reflux disease)   . Inguinal hernia   . Umbilical hernia   . Diverticulitis   . Prostate cancer ([place])   . Vitamin D deficiency   . IBS (irritable bowel syndrome)     ALLERGIES:  has No Known Allergies.  MEDICATIONS:  Current Outpatient Prescriptions  Medication Sig Dispense Refill  . hydroxyurea (HYDREA) 500  MG capsule Take 1 capsule (500 mg total) by mouth daily. May take with food. Alternates 1028m (2 capsules) with 500 mg (1 capsule) daily 120 capsule 2  . hyoscyamine (LEVSIN SL) 0.125 MG SL tablet Place 1 tablet (0.125 mg total) under the tongue every 4 (four) hours as needed. 30 tablet 11  . psyllium (METAMUCIL) 58.6 % powder Take 1 packet by mouth 3 (three) times daily.     No current facility-administered medications for this visit.    SURGICAL HISTORY:  Past Surgical History  Procedure Laterality Date  . Prostatectomy    . Tonsillectomy    . Colonoscopy    . Polypectomy      REVIEW OF SYSTEMS:  Constitutional: positive for fatigue Eyes: negative Ears, nose, mouth, throat, and face: negative Respiratory: positive for dyspnea on exertion Cardiovascular: negative Gastrointestinal: negative Genitourinary:negative Integument/breast: negative Hematologic/lymphatic: negative Musculoskeletal:negative Neurological: negative Behavioral/Psych: negative Endocrine: negative Allergic/Immunologic: negative   PHYSICAL EXAMINATION: General appearance: alert, cooperative, appears stated age and no distress Head: Normocephalic, without obvious abnormality, atraumatic Neck: no adenopathy, no carotid bruit, no JVD, supple, symmetrical, trachea midline and thyroid not enlarged, symmetric, no tenderness/mass/nodules Lymph nodes: Cervical, supraclavicular, and axillary nodes normal. Resp: clear to auscultation bilaterally Back: symmetric, no curvature. ROM normal. No CVA tenderness. Cardio: regular rate and rhythm, S1, S2 normal, no murmur, click, rub or gallop GI: mild left lower quadrant tenderness without rebound or referred pain, remainder the abdomen is soft nontender no appreciable organomegaly, bowel sounds present and normally active in all quadrants  Extremities: extremities normal, atraumatic, no cyanosis or edema Neurologic: Alert and oriented X 3, normal strength and tone. Normal  symmetric reflexes. Normal coordination and gait  ECOG PERFORMANCE STATUS: 1 - Symptomatic but completely ambulatory  There were no vitals taken for this visit.  LABORATORY DATA: Lab Results  Component Value Date   WBC 4.0 10/29/2015   HGB 6.5* 10/29/2015   HCT 19.5* 10/29/2015   MCV 106.6* 10/29/2015   PLT 519* 10/29/2015      Chemistry      Component Value Date/Time   NA 141 08/05/2015 0848   K 4.5 08/05/2015 0848   CO2 25 08/05/2015 0848   BUN 22.7 08/05/2015 0848   CREATININE 0.9 08/05/2015 0848      Component Value Date/Time   CALCIUM 9.2 08/05/2015 0848   ALKPHOS 49 08/05/2015 0848   AST 13 08/05/2015 0848   ALT 15 08/05/2015 0848   BILITOT 0.86 08/05/2015 0848      RADIOGRAPHIC STUDIES:  ASSESSMENT AND PLAN:  This is a very pleasant 70 years old diagnosed with essential thrombocythemia with positive JAK-2 mutation and also recently with myelodysplastic syndrome.  He is currently on hydroxyurea to 1000 mg alternating with 500 mg every other day. He is tolerating his treatment well except for the fatigue. CBC today showed hemoglobin is down to 6.5. For the anemia of neoplastic disease, I will arrange for the patient to receive 2 units of PRBCs transfusion today. He will continue treatment with hydroxyurea  but I would increase the dose to 1000 mg by mouth daily.  For the myelodysplastic syndrome, the patient has an appointment with a hematologist at [facility] for evaluation and second opinion regarding his condition. He may benefit from treatment with Vidaza and the near future. I will continue to monitor his CBC on monthly basis. I will see the patient back for follow-up visit in 2 months for evaluation and repeat CBC, comprehensive metabolic panel and LDH.  He was advised to call immediately if he has any concerning symptoms in the interval.  All questions were answered. The patient knows to call the clinic with any problems, questions or concerns. We can  certainly see the patient much sooner if necessary.  Disclaimer: This note was dictated with voice recognition software. Similar sounding words can inadvertently be transcribed and may not be corrected upon review.

## 2015-10-29 NOTE — Telephone Encounter (Signed)
Gave pt cal & avs °

## 2015-10-30 ENCOUNTER — Ambulatory Visit (HOSPITAL_COMMUNITY)
Admission: RE | Admit: 2015-10-30 | Discharge: 2015-10-30 | Disposition: A | Discharge status: Home / Self Care | Payer: Medicare Other | Source: Ambulatory Visit | Attending: Internal Medicine | Admitting: Internal Medicine

## 2015-10-30 VITALS — BP 112/63 | HR 64 | Temp 98.7°F | Resp 20

## 2015-10-30 DIAGNOSIS — D649 Anemia, unspecified: Secondary | ICD-10-CM | POA: Diagnosis not present

## 2015-10-30 MED ORDER — SODIUM CHLORIDE 0.9 % IV SOLN
250.0000 mL | Freq: Once | INTRAVENOUS | Status: AC
  Administered 2015-10-30: 250 mL via INTRAVENOUS

## 2015-10-30 MED ORDER — ACETAMINOPHEN 325 MG PO TABS
650.0000 mg | ORAL_TABLET | Freq: Once | ORAL | Status: AC
  Administered 2015-10-30: 650 mg via ORAL
  Filled 2015-10-30: qty 2

## 2015-10-30 MED ORDER — DIPHENHYDRAMINE HCL 25 MG PO CAPS
25.0000 mg | ORAL_CAPSULE | Freq: Once | ORAL | Status: AC
  Administered 2015-10-30: 25 mg via ORAL
  Filled 2015-10-30: qty 1

## 2015-10-30 NOTE — Discharge Instructions (Signed)
Blood Transfusion, Care After  These instructions give you information about caring for yourself after your procedure. Your doctor may also give you more specific instructions. Call your doctor if you have any problems or questions after your procedure.   HOME CARE   Take medicines only as told by your doctor. Ask your doctor if you can take an over-the-counter pain reliever if you have a fever or headache a day or two after your procedure.   Return to your normal activities as told by your doctor.  GET HELP IF:    You develop redness or irritation at your IV site.   You have a fever, chills, or a headache that does not go away.   Your pee (urine) is darker than normal.   Your urine turns:    Pink.    Red.    Brown.   The white part of your eye turns yellow (jaundice).   You feel weak after doing your normal activities.  GET HELP RIGHT AWAY IF:    You have trouble breathing.   You have fever and chills and you also have:    Anxiety.    Chest or back pain.    Flushed or pink skin.    Clammy or sweaty skin.    A fast heartbeat.    A sick feeling in your stomach (nausea).     This information is not intended to replace advice given to you by your health care provider. Make sure you discuss any questions you have with your health care provider.     Document Released: 04/18/2014 Document Reviewed: 04/18/2014  Elsevier Interactive Patient Education 2016 Elsevier Inc.

## 2015-10-30 NOTE — Progress Notes (Signed)
Diagnosis Association: Symptomatic anemia (285.9)  Provider: Mayme Genta  Procedure: Pt received 2 units of PRBCs  Pt tolerated procedure well.  Post procedure: Pt alert, oriented and ambulatory at discharge. Pt voiced understanding of discharge instructions.

## 2015-11-01 LAB — TYPE AND SCREEN
ABO/RH(D): O POS
Antibody Screen: NEGATIVE
UNIT DIVISION: 0
UNIT DIVISION: 0

## 2015-11-18 ENCOUNTER — Encounter: Payer: Self-pay | Admitting: Internal Medicine

## 2015-11-20 ENCOUNTER — Encounter: Payer: Self-pay | Admitting: Internal Medicine

## 2015-11-26 ENCOUNTER — Other Ambulatory Visit (HOSPITAL_BASED_OUTPATIENT_CLINIC_OR_DEPARTMENT_OTHER): Payer: Medicare Other

## 2015-11-26 ENCOUNTER — Telehealth: Payer: Self-pay | Admitting: Internal Medicine

## 2015-11-26 DIAGNOSIS — D473 Essential (hemorrhagic) thrombocythemia: Secondary | ICD-10-CM

## 2015-11-26 DIAGNOSIS — D469 Myelodysplastic syndrome, unspecified: Secondary | ICD-10-CM

## 2015-11-26 LAB — CBC WITH DIFFERENTIAL/PLATELET
BASO%: 2.1 % — AB (ref 0.0–2.0)
Basophils Absolute: 0.1 10*3/uL (ref 0.0–0.1)
EOS%: 1.7 % (ref 0.0–7.0)
Eosinophils Absolute: 0.1 10*3/uL (ref 0.0–0.5)
HEMATOCRIT: 25.1 % — AB (ref 38.4–49.9)
HGB: 8.2 g/dL — ABNORMAL LOW (ref 13.0–17.1)
LYMPH%: 20.7 % (ref 14.0–49.0)
MCH: 36.6 pg — ABNORMAL HIGH (ref 27.2–33.4)
MCHC: 32.7 g/dL (ref 32.0–36.0)
MCV: 112 fL — ABNORMAL HIGH (ref 79.3–98.0)
MONO#: 0.6 10*3/uL (ref 0.1–0.9)
MONO%: 9.6 % (ref 0.0–14.0)
NEUT#: 3.8 10*3/uL (ref 1.5–6.5)
NEUT%: 65.9 % (ref 39.0–75.0)
PLATELETS: 624 10*3/uL — AB (ref 140–400)
RBC: 2.24 10*6/uL — ABNORMAL LOW (ref 4.20–5.82)
RDW: 33.2 % — AB (ref 11.0–14.6)
WBC: 5.8 10*3/uL (ref 4.0–10.3)
lymph#: 1.2 10*3/uL (ref 0.9–3.3)

## 2015-11-26 NOTE — Telephone Encounter (Signed)
Patient stopped by today after lab. Per patient nurse called him and asked that he stop by scheduling today for three additional lab appointments. Desk nurse came out to speak with patient (no orders - no messages). Labs being requested by [facility]. Dates added per patient for 8/29, and 9/8 due to scheduled and being out of town. Patient will not have lab the week of 8/21 per his request and patient will keep 9/14 lab/fu. Desk nurse will enter orders.

## 2015-12-03 ENCOUNTER — Other Ambulatory Visit: Payer: Self-pay | Admitting: *Deleted

## 2015-12-03 DIAGNOSIS — D469 Myelodysplastic syndrome, unspecified: Secondary | ICD-10-CM

## 2015-12-03 DIAGNOSIS — D473 Essential (hemorrhagic) thrombocythemia: Secondary | ICD-10-CM

## 2015-12-08 ENCOUNTER — Other Ambulatory Visit (HOSPITAL_BASED_OUTPATIENT_CLINIC_OR_DEPARTMENT_OTHER): Payer: Medicare Other

## 2015-12-08 DIAGNOSIS — D473 Essential (hemorrhagic) thrombocythemia: Secondary | ICD-10-CM | POA: Diagnosis not present

## 2015-12-08 DIAGNOSIS — D469 Myelodysplastic syndrome, unspecified: Secondary | ICD-10-CM | POA: Diagnosis not present

## 2015-12-08 LAB — CBC WITH DIFFERENTIAL/PLATELET
BASO%: 1.3 % (ref 0.0–2.0)
Basophils Absolute: 0.1 10*3/uL (ref 0.0–0.1)
EOS ABS: 0.2 10*3/uL (ref 0.0–0.5)
EOS%: 2.3 % (ref 0.0–7.0)
HCT: 33.1 % — ABNORMAL LOW (ref 38.4–49.9)
HGB: 10.5 g/dL — ABNORMAL LOW (ref 13.0–17.1)
LYMPH%: 17.1 % (ref 14.0–49.0)
MCH: 36.9 pg — AB (ref 27.2–33.4)
MCHC: 31.7 g/dL — ABNORMAL LOW (ref 32.0–36.0)
MCV: 116.4 fL — AB (ref 79.3–98.0)
MONO#: 0.6 10*3/uL (ref 0.1–0.9)
MONO%: 8.1 % (ref 0.0–14.0)
NEUT%: 71.2 % (ref 39.0–75.0)
NEUTROS ABS: 5 10*3/uL (ref 1.5–6.5)
Platelets: 788 10*3/uL — ABNORMAL HIGH (ref 140–400)
RBC: 2.85 10*6/uL — AB (ref 4.20–5.82)
RDW: 33.2 % — ABNORMAL HIGH (ref 11.0–14.6)
WBC: 7 10*3/uL (ref 4.0–10.3)
lymph#: 1.2 10*3/uL (ref 0.9–3.3)

## 2015-12-18 ENCOUNTER — Other Ambulatory Visit (HOSPITAL_BASED_OUTPATIENT_CLINIC_OR_DEPARTMENT_OTHER): Payer: Medicare Other

## 2015-12-18 DIAGNOSIS — D473 Essential (hemorrhagic) thrombocythemia: Secondary | ICD-10-CM | POA: Diagnosis not present

## 2015-12-18 DIAGNOSIS — D469 Myelodysplastic syndrome, unspecified: Secondary | ICD-10-CM | POA: Diagnosis not present

## 2015-12-18 LAB — CBC WITH DIFFERENTIAL/PLATELET
BASO%: 1.5 % (ref 0.0–2.0)
BASOS ABS: 0.1 10*3/uL (ref 0.0–0.1)
EOS%: 3 % (ref 0.0–7.0)
Eosinophils Absolute: 0.2 10*3/uL (ref 0.0–0.5)
HEMATOCRIT: 34.9 % — AB (ref 38.4–49.9)
HGB: 11.4 g/dL — ABNORMAL LOW (ref 13.0–17.1)
LYMPH#: 1.4 10*3/uL (ref 0.9–3.3)
LYMPH%: 18.5 % (ref 14.0–49.0)
MCH: 36.7 pg — AB (ref 27.2–33.4)
MCHC: 32.6 g/dL (ref 32.0–36.0)
MCV: 112.6 fL — ABNORMAL HIGH (ref 79.3–98.0)
MONO#: 0.6 10*3/uL (ref 0.1–0.9)
MONO%: 7.2 % (ref 0.0–14.0)
NEUT#: 5.4 10*3/uL (ref 1.5–6.5)
NEUT%: 69.8 % (ref 39.0–75.0)
RBC: 3.1 10*6/uL — AB (ref 4.20–5.82)
RDW: 31.7 % — ABNORMAL HIGH (ref 11.0–14.6)
WBC: 7.7 10*3/uL (ref 4.0–10.3)

## 2015-12-24 ENCOUNTER — Other Ambulatory Visit (HOSPITAL_BASED_OUTPATIENT_CLINIC_OR_DEPARTMENT_OTHER): Payer: Medicare Other

## 2015-12-24 ENCOUNTER — Ambulatory Visit (HOSPITAL_BASED_OUTPATIENT_CLINIC_OR_DEPARTMENT_OTHER): Payer: Medicare Other | Admitting: Internal Medicine

## 2015-12-24 ENCOUNTER — Telehealth: Payer: Self-pay | Admitting: Internal Medicine

## 2015-12-24 ENCOUNTER — Telehealth: Payer: Self-pay | Admitting: Medical Oncology

## 2015-12-24 ENCOUNTER — Encounter: Payer: Self-pay | Admitting: Internal Medicine

## 2015-12-24 VITALS — BP 151/84 | HR 68 | Temp 98.0°F | Resp 17 | Ht 67.25 in | Wt 163.6 lb

## 2015-12-24 DIAGNOSIS — D469 Myelodysplastic syndrome, unspecified: Secondary | ICD-10-CM

## 2015-12-24 DIAGNOSIS — D473 Essential (hemorrhagic) thrombocythemia: Secondary | ICD-10-CM | POA: Diagnosis not present

## 2015-12-24 LAB — CBC WITH DIFFERENTIAL/PLATELET
BASO%: 2.3 % — ABNORMAL HIGH (ref 0.0–2.0)
BASOS ABS: 0.2 10*3/uL — AB (ref 0.0–0.1)
EOS%: 3.2 % (ref 0.0–7.0)
Eosinophils Absolute: 0.3 10*3/uL (ref 0.0–0.5)
HCT: 36.5 % — ABNORMAL LOW (ref 38.4–49.9)
HGB: 11.5 g/dL — ABNORMAL LOW (ref 13.0–17.1)
LYMPH%: 17.6 % (ref 14.0–49.0)
MCH: 35.1 pg — AB (ref 27.2–33.4)
MCHC: 31.5 g/dL — AB (ref 32.0–36.0)
MCV: 111.4 fL — ABNORMAL HIGH (ref 79.3–98.0)
MONO#: 0.7 10*3/uL (ref 0.1–0.9)
MONO%: 8.1 % (ref 0.0–14.0)
NEUT#: 6.4 10*3/uL (ref 1.5–6.5)
NEUT%: 68.8 % (ref 39.0–75.0)
Platelets: 1384 10*3/uL — ABNORMAL HIGH (ref 140–400)
RBC: 3.27 10*6/uL — AB (ref 4.20–5.82)
RDW: 31.9 % — ABNORMAL HIGH (ref 11.0–14.6)
WBC: 9.3 10*3/uL (ref 4.0–10.3)
lymph#: 1.6 10*3/uL (ref 0.9–3.3)

## 2015-12-24 LAB — COMPREHENSIVE METABOLIC PANEL
ALBUMIN: 3.9 g/dL (ref 3.5–5.0)
ALT: 23 U/L (ref 0–55)
ANION GAP: 6 meq/L (ref 3–11)
AST: 19 U/L (ref 5–34)
Alkaline Phosphatase: 62 U/L (ref 40–150)
BUN: 19.4 mg/dL (ref 7.0–26.0)
CALCIUM: 9.3 mg/dL (ref 8.4–10.4)
CHLORIDE: 109 meq/L (ref 98–109)
CO2: 26 mEq/L (ref 22–29)
CREATININE: 0.8 mg/dL (ref 0.7–1.3)
EGFR: 89 mL/min/{1.73_m2} — AB (ref >90)
GLUCOSE: 96 mg/dL (ref 70–140)
Potassium: 5.2 mEq/L — ABNORMAL HIGH (ref 3.5–5.1)
SODIUM: 142 meq/L (ref 136–145)
Total Bilirubin: 0.69 mg/dL (ref 0.20–1.20)
Total Protein: 7.1 g/dL (ref 6.4–8.3)

## 2015-12-24 LAB — LACTATE DEHYDROGENASE: LDH: 303 U/L — AB (ref 125–245)

## 2015-12-24 NOTE — Telephone Encounter (Signed)
Faxed cbc/diff with note to call for any treatment.

## 2015-12-24 NOTE — Progress Notes (Signed)
[place]  [phone] Fax:(336) [phone]  OFFICE PROGRESS NOTE  [name], MD [place] [address]  DIAGNOSIS: 1. 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 Essential thrombocythemia with positive JAK2 mutation 3. Leukocytosis.   PRIOR THERAPY: Previous treatment with combination of Hydrea and anagrelide.  CURRENT THERAPY: None.   INTERVAL HISTORY: Roy Johnson 70 y.o. male returns for follow up visit. The patient is feeling fine today except for fatigue, worse with exercise. He was referred to [facility] and seen by Dr. Hulan Amato. 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 He was also seen by Dr. Evelene Croon and she recommended for him to discontinue treatment with hydroxyurea. His platelets count has been increasing since that time. He is feeling much better with the improvement of his hemoglobin and hematocrit. He continues to have intermittent frontal headache and peripheral neuropathy. He denied having any significant bleeding, bruises or ecchymosis. The patient denied having any significant fever or chills. He has no nausea or vomiting. He has no significant weight loss or night sweats. He has repeat CBC performed earlier today and he is here for evaluation and discussion of his lab results.   MEDICAL HISTORY: Past Medical History:  Diagnosis Date  . Anemia   . Colon polyp   . Diverticulitis   . Diverticulosis   . GERD (gastroesophageal reflux disease)   . Hyperlipidemia   . IBS (irritable bowel syndrome)   . Inguinal hernia   . Prostate cancer ([place])                . Prostate cancer ([place])   . Umbilical hernia   . Vitamin D deficiency     ALLERGIES:  has No Known  Allergies.  MEDICATIONS:  Current Outpatient Prescriptions  Medication Sig Dispense Refill  . hydroxyurea (HYDREA) 500 MG capsule Take 1 capsule (500 mg total) by mouth daily. May take with food. Alternates '1000mg'$  (2 capsules) with 500 mg (1 capsule) daily 120 capsule 2  . hyoscyamine (LEVSIN SL) 0.125 MG SL tablet Place 1 tablet (0.125 mg total) under the tongue every 4 (four) hours as needed. 30 tablet 11  . psyllium (METAMUCIL) 58.6 % powder Take 1 packet by mouth 3 (three) times daily.     No current facility-administered medications for this visit.     SURGICAL HISTORY:  Past Surgical History:  Procedure Laterality Date  . COLONOSCOPY    . POLYPECTOMY    . PROSTATECTOMY    . TONSILLECTOMY      REVIEW OF SYSTEMS:  A comprehensive review of systems was negative except for: Constitutional: positive for fatigue Neurological: positive for headaches and paresthesia   PHYSICAL EXAMINATION: General appearance: alert, cooperative, appears stated age and no distress Head: Normocephalic, without obvious abnormality, atraumatic Neck: no adenopathy, no carotid bruit, no JVD, supple, symmetrical, trachea midline and thyroid not enlarged, symmetric, no tenderness/mass/nodules Lymph nodes: Cervical, supraclavicular, and axillary nodes normal. Resp: clear to auscultation bilaterally Back: symmetric, no curvature. ROM normal. No CVA tenderness. Cardio: regular rate and rhythm, S1, S2 normal, no murmur, click, rub or gallop GI: mild left lower quadrant tenderness without rebound or referred pain, remainder the abdomen is soft nontender no appreciable organomegaly, bowel sounds present and normally active in all quadrants Extremities: extremities normal, atraumatic, no cyanosis or edema Neurologic: Alert and oriented X  3, normal strength and tone. Normal symmetric reflexes. Normal coordination and gait  ECOG PERFORMANCE STATUS: 1 - Symptomatic but completely ambulatory  Blood pressure (!)  151/84, pulse 68, temperature 98 F (36.7 C), temperature source Oral, resp. rate 17, height 5' 7.25" (1.708 m), weight 163 lb 9.6 oz (74.2 kg), SpO2 99 %.  LABORATORY DATA: Lab Results  Component Value Date   WBC 9.3 12/24/2015   HGB 11.5 (L) 12/24/2015   HCT 36.5 (L) 12/24/2015   MCV 111.4 (H) 12/24/2015   PLT 1,384 (H) 12/24/2015      Chemistry      Component Value Date/Time   NA 142 10/29/2015 0828   K 4.3 10/29/2015 0828   CO2 26 10/29/2015 0828   BUN 19.7 10/29/2015 0828   CREATININE 0.8 10/29/2015 0828      Component Value Date/Time   CALCIUM 9.0 10/29/2015 0828   ALKPHOS 53 10/29/2015 0828   AST 14 10/29/2015 0828   ALT 20 10/29/2015 0828   BILITOT 1.04 10/29/2015 0828      RADIOGRAPHIC STUDIES:  ASSESSMENT AND PLAN:  This is a very pleasant 70 years old diagnosed with essential thrombocythemia with positive JAK-2 mutation and also recently with myelodysplastic syndrome.  He was treated in the past with hydroxyurea which was discontinued by Dr. Evelene Croon. Unfortunately his platelets count has significantly increased in the last few weeks and currently close to 1.4 million. I discussed the lab result with the patient today. I will also discuss the results with Dr. Evelene Croon to see if the patient would benefit from resuming treatment with Hydrea at least with a reduced dose because of the significant increase in his platelets count. I will continue to monitor him closely with weekly CBC.  For the hypertension, I strongly advised the patient to contact his primary care physician for evaluation and adjustment of his medication. I will see the patient back for follow-up visit in 2 months for evaluation and repeat CBC, comprehensive metabolic panel and LDH.  He was advised to call immediately if he has any concerning symptoms in the interval.  All questions were answered. The patient knows to call the clinic with any problems, questions or concerns. We can certainly see the  patient much sooner if necessary.  Disclaimer: This note was dictated with voice recognition software. Similar sounding words can inadvertently be transcribed and may not be corrected upon review.

## 2015-12-24 NOTE — Telephone Encounter (Signed)
Patient refused lab appt for 10/12. Stated that he will have labs drawn @ [facility] on 10/4 and will fwd to Dr. Julien Nordmann. Avs report and schedule given to patient, per 12/24/15 los.

## 2015-12-24 NOTE — Telephone Encounter (Signed)
I left message with triage nurse re pt high platelet count and requested call back regarding Dr Evelene Croon recommendation for treatment.

## 2015-12-31 ENCOUNTER — Encounter: Payer: Self-pay | Admitting: Internal Medicine

## 2015-12-31 ENCOUNTER — Other Ambulatory Visit (HOSPITAL_BASED_OUTPATIENT_CLINIC_OR_DEPARTMENT_OTHER): Payer: Medicare Other

## 2015-12-31 DIAGNOSIS — D469 Myelodysplastic syndrome, unspecified: Secondary | ICD-10-CM

## 2015-12-31 DIAGNOSIS — D473 Essential (hemorrhagic) thrombocythemia: Secondary | ICD-10-CM | POA: Diagnosis not present

## 2015-12-31 LAB — CBC WITH DIFFERENTIAL/PLATELET
BASO%: 2 % (ref 0.0–2.0)
Basophils Absolute: 0.2 10*3/uL — ABNORMAL HIGH (ref 0.0–0.1)
EOS%: 2.5 % (ref 0.0–7.0)
Eosinophils Absolute: 0.2 10*3/uL (ref 0.0–0.5)
HEMATOCRIT: 35.6 % — AB (ref 38.4–49.9)
HGB: 11.4 g/dL — ABNORMAL LOW (ref 13.0–17.1)
LYMPH#: 1.5 10*3/uL (ref 0.9–3.3)
LYMPH%: 16.6 % (ref 14.0–49.0)
MCH: 34.9 pg — ABNORMAL HIGH (ref 27.2–33.4)
MCHC: 31.9 g/dL — AB (ref 32.0–36.0)
MCV: 109.3 fL — ABNORMAL HIGH (ref 79.3–98.0)
MONO#: 0.7 10*3/uL (ref 0.1–0.9)
MONO%: 8.1 % (ref 0.0–14.0)
NEUT#: 6.5 10*3/uL (ref 1.5–6.5)
NEUT%: 70.8 % (ref 39.0–75.0)
Platelets: 1473 10*3/uL — ABNORMAL HIGH (ref 140–400)
RBC: 3.26 10*6/uL — AB (ref 4.20–5.82)
RDW: 33.3 % — ABNORMAL HIGH (ref 11.0–14.6)
WBC: 9.2 10*3/uL (ref 4.0–10.3)

## 2016-01-01 ENCOUNTER — Other Ambulatory Visit: Payer: Self-pay | Admitting: Medical Oncology

## 2016-01-01 ENCOUNTER — Telehealth: Payer: Self-pay | Admitting: Medical Oncology

## 2016-01-01 NOTE — Telephone Encounter (Signed)
Pt stated he is taking hydrea 1000mg  /day per Dr Angeline Slim at [facility].

## 2016-01-01 NOTE — Telephone Encounter (Signed)
I left a message to return my call. He has a prescription for hydrea from Dr Evelene Croon at [facility].

## 2016-01-07 ENCOUNTER — Other Ambulatory Visit (HOSPITAL_BASED_OUTPATIENT_CLINIC_OR_DEPARTMENT_OTHER): Payer: Medicare Other

## 2016-01-07 DIAGNOSIS — D469 Myelodysplastic syndrome, unspecified: Secondary | ICD-10-CM

## 2016-01-07 DIAGNOSIS — D473 Essential (hemorrhagic) thrombocythemia: Secondary | ICD-10-CM

## 2016-01-07 LAB — CBC WITH DIFFERENTIAL/PLATELET
BASO%: 0.3 % (ref 0.0–2.0)
BASOS ABS: 0 10*3/uL (ref 0.0–0.1)
EOS ABS: 0.5 10*3/uL (ref 0.0–0.5)
EOS%: 4.3 % (ref 0.0–7.0)
HCT: 33.3 % — ABNORMAL LOW (ref 38.4–49.9)
HEMOGLOBIN: 10.9 g/dL — AB (ref 13.0–17.1)
LYMPH#: 1.4 10*3/uL (ref 0.9–3.3)
LYMPH%: 11.4 % — ABNORMAL LOW (ref 14.0–49.0)
MCH: 33.1 pg (ref 27.2–33.4)
MCHC: 32.7 g/dL (ref 32.0–36.0)
MCV: 101.2 fL — AB (ref 79.3–98.0)
MONO#: 1.2 10*3/uL — ABNORMAL HIGH (ref 0.1–0.9)
MONO%: 10.2 % (ref 0.0–14.0)
NEUT%: 73.8 % (ref 39.0–75.0)
NEUTROS ABS: 8.8 10*3/uL — AB (ref 1.5–6.5)
NRBC: 0 % (ref 0–0)
Platelets: 932 10*3/uL — ABNORMAL HIGH (ref 140–400)
RBC: 3.29 10*6/uL — AB (ref 4.20–5.82)
WBC: 12 10*3/uL — AB (ref 4.0–10.3)

## 2016-01-18 ENCOUNTER — Other Ambulatory Visit: Payer: Self-pay | Admitting: Medical Oncology

## 2016-01-18 DIAGNOSIS — D473 Essential (hemorrhagic) thrombocythemia: Secondary | ICD-10-CM

## 2016-01-21 ENCOUNTER — Telehealth: Payer: Self-pay | Admitting: Medical Oncology

## 2016-01-21 ENCOUNTER — Other Ambulatory Visit (HOSPITAL_BASED_OUTPATIENT_CLINIC_OR_DEPARTMENT_OTHER): Payer: Medicare Other

## 2016-01-21 DIAGNOSIS — D469 Myelodysplastic syndrome, unspecified: Secondary | ICD-10-CM

## 2016-01-21 LAB — CBC WITH DIFFERENTIAL/PLATELET
BASO%: 1.8 % (ref 0.0–2.0)
BASOS ABS: 0.1 10*3/uL (ref 0.0–0.1)
EOS%: 4.2 % (ref 0.0–7.0)
Eosinophils Absolute: 0.3 10*3/uL (ref 0.0–0.5)
HCT: 30.9 % — ABNORMAL LOW (ref 38.4–49.9)
HEMOGLOBIN: 10.2 g/dL — AB (ref 13.0–17.1)
LYMPH#: 1.5 10*3/uL (ref 0.9–3.3)
LYMPH%: 21.2 % (ref 14.0–49.0)
MCH: 32.7 pg (ref 27.2–33.4)
MCHC: 33 g/dL (ref 32.0–36.0)
MCV: 99 fL — ABNORMAL HIGH (ref 79.3–98.0)
MONO#: 0.7 10*3/uL (ref 0.1–0.9)
MONO%: 9.6 % (ref 0.0–14.0)
NEUT%: 63.2 % (ref 39.0–75.0)
NEUTROS ABS: 4.3 10*3/uL (ref 1.5–6.5)
NRBC: 0 % (ref 0–0)
Platelets: 676 10*3/uL — ABNORMAL HIGH (ref 140–400)
RBC: 3.12 10*6/uL — AB (ref 4.20–5.82)
RDW: 30.5 % — AB (ref 11.0–14.6)
WBC: 6.8 10*3/uL (ref 4.0–10.3)

## 2016-01-21 NOTE — Telephone Encounter (Signed)
Faxed cbc

## 2016-01-27 ENCOUNTER — Telehealth: Payer: Self-pay | Admitting: Internal Medicine

## 2016-01-27 NOTE — Telephone Encounter (Signed)
Returned call to patient regarding 10/19 appointment time that he wished to reschedule. Could not leave a Voicemail message because there isn't a voicemail box set up.

## 2016-01-27 NOTE — Telephone Encounter (Signed)
Appointment time rescheduled, per patient request. 01/27/16

## 2016-01-28 ENCOUNTER — Telehealth: Payer: Self-pay | Admitting: Medical Oncology

## 2016-01-28 ENCOUNTER — Other Ambulatory Visit: Payer: Medicare Other

## 2016-01-28 ENCOUNTER — Other Ambulatory Visit (HOSPITAL_BASED_OUTPATIENT_CLINIC_OR_DEPARTMENT_OTHER): Payer: Medicare Other

## 2016-01-28 DIAGNOSIS — D469 Myelodysplastic syndrome, unspecified: Secondary | ICD-10-CM | POA: Diagnosis not present

## 2016-01-28 LAB — CBC WITH DIFFERENTIAL/PLATELET
BASO%: 0.2 % (ref 0.0–2.0)
Basophils Absolute: 0 10*3/uL (ref 0.0–0.1)
EOS ABS: 0 10*3/uL (ref 0.0–0.5)
EOS%: 0.2 % (ref 0.0–7.0)
HEMATOCRIT: 30.5 % — AB (ref 38.4–49.9)
HGB: 10.1 g/dL — ABNORMAL LOW (ref 13.0–17.1)
LYMPH%: 6.9 % — AB (ref 14.0–49.0)
MCH: 32.3 pg (ref 27.2–33.4)
MCHC: 33.1 g/dL (ref 32.0–36.0)
MCV: 97.4 fL (ref 79.3–98.0)
MONO#: 1 10*3/uL — AB (ref 0.1–0.9)
MONO%: 5.8 % (ref 0.0–14.0)
NEUT#: 14.5 10*3/uL — ABNORMAL HIGH (ref 1.5–6.5)
NEUT%: 86.9 % — AB (ref 39.0–75.0)
PLATELETS: 753 10*3/uL — AB (ref 140–400)
RBC: 3.13 10*6/uL — ABNORMAL LOW (ref 4.20–5.82)
RDW: 30.9 % — ABNORMAL HIGH (ref 11.0–14.6)
WBC: 16.7 10*3/uL — ABNORMAL HIGH (ref 4.0–10.3)
lymph#: 1.2 10*3/uL (ref 0.9–3.3)
nRBC: 0 % (ref 0–0)

## 2016-01-28 LAB — TECHNOLOGIST REVIEW

## 2016-01-28 NOTE — Telephone Encounter (Signed)
Faxed lab results to [facility]

## 2016-02-04 ENCOUNTER — Telehealth: Payer: Self-pay | Admitting: Medical Oncology

## 2016-02-04 ENCOUNTER — Other Ambulatory Visit (HOSPITAL_BASED_OUTPATIENT_CLINIC_OR_DEPARTMENT_OTHER): Payer: Medicare Other

## 2016-02-04 DIAGNOSIS — D469 Myelodysplastic syndrome, unspecified: Secondary | ICD-10-CM

## 2016-02-04 LAB — CBC WITH DIFFERENTIAL/PLATELET
BASO%: 2 % (ref 0.0–2.0)
BASOS ABS: 0.1 10*3/uL (ref 0.0–0.1)
EOS ABS: 0.2 10*3/uL (ref 0.0–0.5)
EOS%: 3.5 % (ref 0.0–7.0)
HEMATOCRIT: 30.1 % — AB (ref 38.4–49.9)
HEMOGLOBIN: 9.8 g/dL — AB (ref 13.0–17.1)
LYMPH#: 1.4 10*3/uL (ref 0.9–3.3)
LYMPH%: 19.8 % (ref 14.0–49.0)
MCH: 34 pg — AB (ref 27.2–33.4)
MCHC: 32.7 g/dL (ref 32.0–36.0)
MCV: 104.1 fL — AB (ref 79.3–98.0)
MONO#: 0.6 10*3/uL (ref 0.1–0.9)
MONO%: 8.3 % (ref 0.0–14.0)
NEUT#: 4.6 10*3/uL (ref 1.5–6.5)
NEUT%: 66.4 % (ref 39.0–75.0)
PLATELETS: 868 10*3/uL — AB (ref 140–400)
RBC: 2.89 10*6/uL — ABNORMAL LOW (ref 4.20–5.82)
RDW: 32.3 % — AB (ref 11.0–14.6)
WBC: 6.9 10*3/uL (ref 4.0–10.3)

## 2016-02-04 NOTE — Telephone Encounter (Signed)
Labs faxed

## 2016-02-05 ENCOUNTER — Other Ambulatory Visit: Payer: Self-pay | Admitting: *Deleted

## 2016-02-05 ENCOUNTER — Telehealth: Payer: Self-pay | Admitting: *Deleted

## 2016-02-05 MED ORDER — OXYCODONE-ACETAMINOPHEN 5-325 MG PO TABS: 1.0000 | ORAL_TABLET | Freq: Four times a day (QID) | ORAL | 0 refills | Status: DC | PRN

## 2016-02-05 NOTE — Telephone Encounter (Signed)
Received call from [facility] stating that they couldn't read the labs that were faxed.  Refaxed labs from [facility] to [phone]

## 2016-02-10 ENCOUNTER — Telehealth: Payer: Self-pay | Admitting: *Deleted

## 2016-02-11 ENCOUNTER — Other Ambulatory Visit (HOSPITAL_BASED_OUTPATIENT_CLINIC_OR_DEPARTMENT_OTHER): Payer: Medicare Other

## 2016-02-11 DIAGNOSIS — D473 Essential (hemorrhagic) thrombocythemia: Secondary | ICD-10-CM | POA: Diagnosis not present

## 2016-02-11 DIAGNOSIS — D469 Myelodysplastic syndrome, unspecified: Secondary | ICD-10-CM

## 2016-02-11 LAB — CBC WITH DIFFERENTIAL/PLATELET
BASO%: 2.4 % — AB (ref 0.0–2.0)
Basophils Absolute: 0.1 10*3/uL (ref 0.0–0.1)
EOS%: 2 % (ref 0.0–7.0)
Eosinophils Absolute: 0.1 10*3/uL (ref 0.0–0.5)
HCT: 32 % — ABNORMAL LOW (ref 38.4–49.9)
HGB: 10.2 g/dL — ABNORMAL LOW (ref 13.0–17.1)
LYMPH%: 22.9 % (ref 14.0–49.0)
MCH: 33.3 pg (ref 27.2–33.4)
MCHC: 31.8 g/dL — AB (ref 32.0–36.0)
MCV: 104.9 fL — ABNORMAL HIGH (ref 79.3–98.0)
MONO#: 0.6 10*3/uL (ref 0.1–0.9)
MONO%: 9.6 % (ref 0.0–14.0)
NEUT#: 3.7 10*3/uL (ref 1.5–6.5)
NEUT%: 63.1 % (ref 39.0–75.0)
PLATELETS: 959 10*3/uL — AB (ref 140–400)
RBC: 3.05 10*6/uL — AB (ref 4.20–5.82)
RDW: 31.1 % — ABNORMAL HIGH (ref 11.0–14.6)
WBC: 5.9 10*3/uL (ref 4.0–10.3)
lymph#: 1.3 10*3/uL (ref 0.9–3.3)

## 2016-02-11 NOTE — Telephone Encounter (Signed)
No call

## 2016-02-12 ENCOUNTER — Telehealth: Payer: Self-pay | Admitting: *Deleted

## 2016-02-12 NOTE — Telephone Encounter (Signed)
Labs faxed to [facility] 

## 2016-02-18 ENCOUNTER — Other Ambulatory Visit (HOSPITAL_BASED_OUTPATIENT_CLINIC_OR_DEPARTMENT_OTHER): Payer: Medicare Other

## 2016-02-18 DIAGNOSIS — D473 Essential (hemorrhagic) thrombocythemia: Secondary | ICD-10-CM

## 2016-02-18 DIAGNOSIS — D469 Myelodysplastic syndrome, unspecified: Secondary | ICD-10-CM

## 2016-02-18 LAB — CBC WITH DIFFERENTIAL/PLATELET
BASO%: 1.7 % (ref 0.0–2.0)
Basophils Absolute: 0.1 10*3/uL (ref 0.0–0.1)
EOS%: 1.8 % (ref 0.0–7.0)
Eosinophils Absolute: 0.1 10*3/uL (ref 0.0–0.5)
HCT: 32 % — ABNORMAL LOW (ref 38.4–49.9)
HGB: 10.3 g/dL — ABNORMAL LOW (ref 13.0–17.1)
LYMPH%: 20.3 % (ref 14.0–49.0)
MCH: 33.2 pg (ref 27.2–33.4)
MCHC: 32.2 g/dL (ref 32.0–36.0)
MCV: 103.2 fL — ABNORMAL HIGH (ref 79.3–98.0)
MONO#: 0.7 10*3/uL (ref 0.1–0.9)
MONO%: 9.6 % (ref 0.0–14.0)
NEUT%: 66.6 % (ref 39.0–75.0)
NEUTROS ABS: 4.8 10*3/uL (ref 1.5–6.5)
Platelets: 832 10*3/uL — ABNORMAL HIGH (ref 140–400)
RBC: 3.1 10*6/uL — AB (ref 4.20–5.82)
RDW: 31 % — ABNORMAL HIGH (ref 11.0–14.6)
WBC: 7.2 10*3/uL (ref 4.0–10.3)
lymph#: 1.5 10*3/uL (ref 0.9–3.3)
nRBC: 0 % (ref 0–0)

## 2016-02-25 ENCOUNTER — Other Ambulatory Visit (HOSPITAL_BASED_OUTPATIENT_CLINIC_OR_DEPARTMENT_OTHER): Payer: Medicare Other

## 2016-02-25 ENCOUNTER — Encounter: Payer: Self-pay | Admitting: Internal Medicine

## 2016-02-25 ENCOUNTER — Telehealth: Payer: Self-pay | Admitting: Internal Medicine

## 2016-02-25 ENCOUNTER — Ambulatory Visit (HOSPITAL_BASED_OUTPATIENT_CLINIC_OR_DEPARTMENT_OTHER): Payer: Medicare Other | Admitting: Internal Medicine

## 2016-02-25 VITALS — BP 149/72 | HR 70 | Temp 98.1°F | Resp 18 | Ht 67.25 in | Wt 163.7 lb

## 2016-02-25 DIAGNOSIS — D469 Myelodysplastic syndrome, unspecified: Secondary | ICD-10-CM | POA: Diagnosis not present

## 2016-02-25 DIAGNOSIS — D473 Essential (hemorrhagic) thrombocythemia: Secondary | ICD-10-CM

## 2016-02-25 LAB — CBC WITH DIFFERENTIAL/PLATELET
BASO%: 1.1 % (ref 0.0–2.0)
BASOS ABS: 0.1 10*3/uL (ref 0.0–0.1)
EOS ABS: 0.2 10*3/uL (ref 0.0–0.5)
EOS%: 3 % (ref 0.0–7.0)
HCT: 29.1 % — ABNORMAL LOW (ref 38.4–49.9)
HGB: 9.6 g/dL — ABNORMAL LOW (ref 13.0–17.1)
LYMPH%: 25.7 % (ref 14.0–49.0)
MCH: 32.1 pg (ref 27.2–33.4)
MCHC: 33 g/dL (ref 32.0–36.0)
MCV: 97.3 fL (ref 79.3–98.0)
MONO#: 0.8 10*3/uL (ref 0.1–0.9)
MONO%: 11.5 % (ref 0.0–14.0)
NEUT#: 3.9 10*3/uL (ref 1.5–6.5)
NEUT%: 58.7 % (ref 39.0–75.0)
PLATELETS: 747 10*3/uL — AB (ref 140–400)
RBC: 2.99 10*6/uL — AB (ref 4.20–5.82)
RDW: 30.6 % — AB (ref 11.0–14.6)
WBC: 6.6 10*3/uL (ref 4.0–10.3)
lymph#: 1.7 10*3/uL (ref 0.9–3.3)
nRBC: 0 % (ref 0–0)

## 2016-02-25 LAB — COMPREHENSIVE METABOLIC PANEL
ALK PHOS: 68 U/L (ref 40–150)
ALT: 29 U/L (ref 0–55)
ANION GAP: 7 meq/L (ref 3–11)
AST: 18 U/L (ref 5–34)
Albumin: 4 g/dL (ref 3.5–5.0)
BUN: 25.8 mg/dL (ref 7.0–26.0)
CO2: 26 meq/L (ref 22–29)
Calcium: 9.4 mg/dL (ref 8.4–10.4)
Chloride: 106 mEq/L (ref 98–109)
Creatinine: 0.9 mg/dL (ref 0.7–1.3)
EGFR: 87 mL/min/{1.73_m2} — AB (ref >90)
GLUCOSE: 102 mg/dL (ref 70–140)
POTASSIUM: 4.9 meq/L (ref 3.5–5.1)
SODIUM: 139 meq/L (ref 136–145)
Total Bilirubin: 0.73 mg/dL (ref 0.20–1.20)
Total Protein: 7.2 g/dL (ref 6.4–8.3)

## 2016-02-25 LAB — LACTATE DEHYDROGENASE: LDH: 186 U/L (ref 125–245)

## 2016-02-25 LAB — TECHNOLOGIST REVIEW

## 2016-02-25 NOTE — Progress Notes (Signed)
[place]  Telephone:(336) [phone] Fax:(336) [phone]  OFFICE PROGRESS NOTE  [name], MD [place] [address]  DIAGNOSIS: 1.  Myelodysplastic syndrome diagnosed in August 2017 2. Essential thrombocythemia with positive JAK2 mutation 3. Leukocytosis.   PRIOR THERAPY: Previous treatment with combination of Hydrea and anagrelide.  CURRENT THERAPY:  1) Hydrea 500 mg by mouth twice a day.  2) Aranesp 300 g subcutaneously every 3 weeks  INTERVAL HISTORY: Roy Johnson 70 y.o. male returns for follow-up visit. The patient is feeling fine today except for fatigue. He was recently diagnosed with myelodysplastic syndrome. His treatment with Hydrea was discontinued but the patient had significant increase in his platelets count and he resumed his treatment with Hydrea 500 mg by mouth twice a day. He has improvement of his platelets count. He denied having any chest pain or shortness of breath. He has no weight loss or night sweats. He has no bleeding issues. He was seen by the stem cell transplant team at [facility]. He was also considered for treatment with Revlimid for the myelodysplastic syndrome. He is expected to travel to [place] in the middle of January 2018. He usually stays there for several months.  MEDICAL HISTORY: Past Medical History:  Diagnosis Date  . Anemia   . Colon polyp   . Diverticulitis   . Diverticulosis   . GERD (gastroesophageal reflux disease)   . Hyperlipidemia   . IBS (irritable bowel syndrome)   . Inguinal hernia   . Prostate cancer ([place])                . Prostate cancer ([place])   . Umbilical hernia   . Vitamin D deficiency     ALLERGIES:  has No Known Allergies.  MEDICATIONS:  Current Outpatient Prescriptions  Medication Sig Dispense Refill  . aspirin (GOODSENSE ASPIRIN) 325 MG tablet Take 325 mg by mouth daily.    . B Complex Vitamins (B COMPLEX-B12 PO) Take by mouth.    . folic acid (FOLVITE) 0.5  MG tablet Take 1 mg by mouth daily.    . hydroxyurea (HYDREA) 500 MG capsule Take 1,000 mg by mouth daily.    . polyethylene glycol powder (GLYCOLAX/MIRALAX) powder   1  . valsartan (DIOVAN) 80 MG tablet take 1/2 tablet by mouth once daily for hypertension    . lenalidomide (REVLIMID) 5 MG capsule Take 5 mg by mouth.     No current facility-administered medications for this visit.     SURGICAL HISTORY:  Past Surgical History:  Procedure Laterality Date  . COLONOSCOPY    . POLYPECTOMY    . PROSTATECTOMY    . TONSILLECTOMY      REVIEW OF SYSTEMS:  A comprehensive review of systems was negative except for: Constitutional: positive for fatigue   PHYSICAL EXAMINATION: General appearance: alert, cooperative, appears stated age and no distress Head: Normocephalic, without obvious abnormality, atraumatic Neck: no adenopathy, no carotid bruit, no JVD, supple, symmetrical, trachea midline and thyroid not enlarged, symmetric, no tenderness/mass/nodules Lymph nodes: Cervical, supraclavicular, and axillary nodes normal. Resp: clear to auscultation bilaterally Back: symmetric, no curvature. ROM normal. No CVA tenderness. Cardio: regular rate and rhythm, S1, S2 normal, no murmur, click, rub or gallop GI: mild left lower quadrant tenderness without rebound or referred pain, remainder the abdomen is soft nontender no appreciable organomegaly, bowel sounds present and normally active in all quadrants Extremities: extremities normal, atraumatic, no cyanosis or edema Neurologic: Alert and oriented X 3, normal  strength and tone. Normal symmetric reflexes. Normal coordination and gait  ECOG PERFORMANCE STATUS: 1 - Symptomatic but completely ambulatory  Blood pressure (!) 149/72, pulse 70, temperature 98.1 F (36.7 C), temperature source Oral, resp. rate 18, height 5' 7.25" (1.708 m), weight 163 lb 11.2 oz (74.3 kg), SpO2 97 %.  LABORATORY DATA: Lab Results  Component Value Date   WBC 6.6 02/25/2016     HGB 9.6 (L) 02/25/2016   HCT 29.1 (L) 02/25/2016   MCV 97.3 02/25/2016   PLT 747 (H) 02/25/2016      Chemistry      Component Value Date/Time   NA 139 02/25/2016 0937   K 4.9 02/25/2016 0937   CO2 26 02/25/2016 0937   BUN 25.8 02/25/2016 0937   CREATININE 0.9 02/25/2016 0937      Component Value Date/Time   CALCIUM 9.4 02/25/2016 0937   ALKPHOS 68 02/25/2016 0937   AST 18 02/25/2016 0937   ALT 29 02/25/2016 0937   BILITOT 0.73 02/25/2016 0937      RADIOGRAPHIC STUDIES:  ASSESSMENT AND PLAN:  This is a very pleasant 70 years old diagnosed with essential thrombocythemia with positive JAK-2 mutation and also recently with myelodysplastic syndrome.  He is currently on hydroxyurea500 mg by mouth twice a day. He is tolerating his treatment well except for the fatigue. He is expected to start treatment with Revlimid soon for the myelodysplastic syndrome. For the myelodysplastic syndrome, we will also consider the patient for treatment with Aranesp 300 g subcutaneously every 3 weeks.  He will come back for follow-up visit in 6 months after he comes back from [place]. He will continue to have blood work during his stay in [place]. For hypertension he was advised to reconsult with his primary care physician for adjustment of his medication. He was advised to call immediately if he has any concerning symptoms in the interval.  All questions were answered. The patient knows to call the clinic with any problems, questions or concerns. We can certainly see the patient much sooner if necessary.  Disclaimer: This note was dictated with voice recognition software. Similar sounding words can inadvertently be transcribed and may not be corrected upon review.

## 2016-02-25 NOTE — Telephone Encounter (Signed)
Labs scheduled weekly per patient & standing order. Injections scheduled every 3 weeks per 02/25/16 los. Follow up and lab appointments scheduled per 02/25/16 los. Patient stated that he may be going away in January, for about 4 months and that he will call to cancel scheduled labs and injections, if not needed. Appointments scheduled per 02/25/16 los. Copy of AVS report and appointment schedule was given to patient, per 02/25/16 los.

## 2016-03-04 ENCOUNTER — Telehealth: Payer: Self-pay | Admitting: Medical Oncology

## 2016-03-04 ENCOUNTER — Other Ambulatory Visit (HOSPITAL_BASED_OUTPATIENT_CLINIC_OR_DEPARTMENT_OTHER): Payer: Medicare Other

## 2016-03-04 ENCOUNTER — Ambulatory Visit (HOSPITAL_BASED_OUTPATIENT_CLINIC_OR_DEPARTMENT_OTHER): Payer: Medicare Other

## 2016-03-04 VITALS — BP 141/80 | HR 71 | Resp 20

## 2016-03-04 DIAGNOSIS — D469 Myelodysplastic syndrome, unspecified: Secondary | ICD-10-CM

## 2016-03-04 LAB — CBC WITH DIFFERENTIAL/PLATELET
BASO%: 0.6 % (ref 0.0–2.0)
Basophils Absolute: 0 10*3/uL (ref 0.0–0.1)
EOS%: 4.2 % (ref 0.0–7.0)
Eosinophils Absolute: 0.3 10*3/uL (ref 0.0–0.5)
HCT: 27.5 % — ABNORMAL LOW (ref 38.4–49.9)
HGB: 9 g/dL — ABNORMAL LOW (ref 13.0–17.1)
LYMPH#: 1.4 10*3/uL (ref 0.9–3.3)
LYMPH%: 19.5 % (ref 14.0–49.0)
MCH: 32.4 pg (ref 27.2–33.4)
MCHC: 32.7 g/dL (ref 32.0–36.0)
MCV: 98.9 fL — ABNORMAL HIGH (ref 79.3–98.0)
MONO#: 0.6 10*3/uL (ref 0.1–0.9)
MONO%: 8.3 % (ref 0.0–14.0)
NEUT#: 4.8 10*3/uL (ref 1.5–6.5)
NEUT%: 67.4 % (ref 39.0–75.0)
Platelets: 214 10*3/uL (ref 140–400)
RBC: 2.78 10*6/uL — AB (ref 4.20–5.82)
RDW: 29.5 % — ABNORMAL HIGH (ref 11.0–14.6)
WBC: 7.1 10*3/uL (ref 4.0–10.3)
nRBC: 1 % — ABNORMAL HIGH (ref 0–0)

## 2016-03-04 MED ORDER — DARBEPOETIN ALFA 300 MCG/0.6ML IJ SOSY
300.0000 ug | PREFILLED_SYRINGE | Freq: Once | INTRAMUSCULAR | Status: AC
  Administered 2016-03-04: 300 ug via SUBCUTANEOUS
  Filled 2016-03-04: qty 0.6

## 2016-03-04 NOTE — Telephone Encounter (Signed)
Lbs faxed

## 2016-03-04 NOTE — Patient Instructions (Signed)

## 2016-03-10 ENCOUNTER — Other Ambulatory Visit (HOSPITAL_BASED_OUTPATIENT_CLINIC_OR_DEPARTMENT_OTHER): Payer: Medicare Other

## 2016-03-10 DIAGNOSIS — D469 Myelodysplastic syndrome, unspecified: Secondary | ICD-10-CM

## 2016-03-10 DIAGNOSIS — D473 Essential (hemorrhagic) thrombocythemia: Secondary | ICD-10-CM | POA: Diagnosis not present

## 2016-03-10 LAB — CBC WITH DIFFERENTIAL/PLATELET
BASO%: 0.7 % (ref 0.0–2.0)
Basophils Absolute: 0 10*3/uL (ref 0.0–0.1)
EOS%: 2.6 % (ref 0.0–7.0)
Eosinophils Absolute: 0.2 10*3/uL (ref 0.0–0.5)
HCT: 29.2 % — ABNORMAL LOW (ref 38.4–49.9)
HGB: 9.5 g/dL — ABNORMAL LOW (ref 13.0–17.1)
LYMPH%: 23.1 % (ref 14.0–49.0)
MCH: 32.2 pg (ref 27.2–33.4)
MCHC: 32.5 g/dL (ref 32.0–36.0)
MCV: 99 fL — ABNORMAL HIGH (ref 79.3–98.0)
MONO#: 0.6 10*3/uL (ref 0.1–0.9)
MONO%: 9.3 % (ref 0.0–14.0)
NEUT%: 64.3 % (ref 39.0–75.0)
NEUTROS ABS: 3.9 10*3/uL (ref 1.5–6.5)
Platelets: 337 10*3/uL (ref 140–400)
RBC: 2.95 10*6/uL — AB (ref 4.20–5.82)
RDW: 31 % — ABNORMAL HIGH (ref 11.0–14.6)
WBC: 6.1 10*3/uL (ref 4.0–10.3)
lymph#: 1.4 10*3/uL (ref 0.9–3.3)
nRBC: 1 % — ABNORMAL HIGH (ref 0–0)

## 2016-03-10 LAB — TECHNOLOGIST REVIEW

## 2016-03-17 ENCOUNTER — Other Ambulatory Visit (HOSPITAL_BASED_OUTPATIENT_CLINIC_OR_DEPARTMENT_OTHER): Payer: Medicare Other

## 2016-03-17 ENCOUNTER — Encounter: Payer: Self-pay | Admitting: Internal Medicine

## 2016-03-17 DIAGNOSIS — D473 Essential (hemorrhagic) thrombocythemia: Secondary | ICD-10-CM | POA: Diagnosis not present

## 2016-03-17 DIAGNOSIS — D469 Myelodysplastic syndrome, unspecified: Secondary | ICD-10-CM | POA: Diagnosis not present

## 2016-03-17 LAB — CBC WITH DIFFERENTIAL/PLATELET
BASO%: 1 % (ref 0.0–2.0)
BASOS ABS: 0.1 10*3/uL (ref 0.0–0.1)
EOS ABS: 0.2 10*3/uL (ref 0.0–0.5)
EOS%: 2.8 % (ref 0.0–7.0)
HCT: 30.5 % — ABNORMAL LOW (ref 38.4–49.9)
HGB: 10 g/dL — ABNORMAL LOW (ref 13.0–17.1)
LYMPH%: 29.3 % (ref 14.0–49.0)
MCH: 32.6 pg (ref 27.2–33.4)
MCHC: 32.8 g/dL (ref 32.0–36.0)
MCV: 99.3 fL — AB (ref 79.3–98.0)
MONO#: 0.6 10*3/uL (ref 0.1–0.9)
MONO%: 10.2 % (ref 0.0–14.0)
NEUT#: 3.3 10*3/uL (ref 1.5–6.5)
NEUT%: 56.7 % (ref 39.0–75.0)
RBC: 3.07 10*6/uL — AB (ref 4.20–5.82)
RDW: 30.9 % — ABNORMAL HIGH (ref 11.0–14.6)
WBC: 5.8 10*3/uL (ref 4.0–10.3)
lymph#: 1.7 10*3/uL (ref 0.9–3.3)
nRBC: 1 % — ABNORMAL HIGH (ref 0–0)

## 2016-03-17 LAB — TECHNOLOGIST REVIEW

## 2016-03-21 ENCOUNTER — Encounter: Payer: Self-pay | Admitting: Medical Oncology

## 2016-03-22 ENCOUNTER — Other Ambulatory Visit: Payer: Self-pay | Admitting: Medical Oncology

## 2016-03-22 DIAGNOSIS — D469 Myelodysplastic syndrome, unspecified: Secondary | ICD-10-CM

## 2016-03-23 ENCOUNTER — Other Ambulatory Visit: Payer: Self-pay | Admitting: Medical Oncology

## 2016-03-23 DIAGNOSIS — E876 Hypokalemia: Secondary | ICD-10-CM

## 2016-03-24 ENCOUNTER — Ambulatory Visit (HOSPITAL_BASED_OUTPATIENT_CLINIC_OR_DEPARTMENT_OTHER): Payer: Medicare Other

## 2016-03-24 ENCOUNTER — Other Ambulatory Visit (HOSPITAL_BASED_OUTPATIENT_CLINIC_OR_DEPARTMENT_OTHER): Payer: Medicare Other

## 2016-03-24 VITALS — BP 146/73 | HR 66 | Temp 98.0°F | Resp 18

## 2016-03-24 DIAGNOSIS — D469 Myelodysplastic syndrome, unspecified: Secondary | ICD-10-CM

## 2016-03-24 DIAGNOSIS — D473 Essential (hemorrhagic) thrombocythemia: Secondary | ICD-10-CM | POA: Diagnosis not present

## 2016-03-24 LAB — CBC WITH DIFFERENTIAL/PLATELET
BASO%: 1.3 % (ref 0.0–2.0)
Basophils Absolute: 0.1 10*3/uL (ref 0.0–0.1)
EOS%: 5.5 % (ref 0.0–7.0)
Eosinophils Absolute: 0.3 10*3/uL (ref 0.0–0.5)
HEMATOCRIT: 30.4 % — AB (ref 38.4–49.9)
HGB: 9.6 g/dL — ABNORMAL LOW (ref 13.0–17.1)
LYMPH#: 1.8 10*3/uL (ref 0.9–3.3)
LYMPH%: 29.6 % (ref 14.0–49.0)
MCH: 33.9 pg — ABNORMAL HIGH (ref 27.2–33.4)
MCHC: 31.7 g/dL — AB (ref 32.0–36.0)
MCV: 106.9 fL — ABNORMAL HIGH (ref 79.3–98.0)
MONO#: 0.5 10*3/uL (ref 0.1–0.9)
MONO%: 7.9 % (ref 0.0–14.0)
NEUT%: 55.7 % (ref 39.0–75.0)
NEUTROS ABS: 3.4 10*3/uL (ref 1.5–6.5)
Platelets: 227 10*3/uL (ref 140–400)
RBC: 2.85 10*6/uL — AB (ref 4.20–5.82)
RDW: 32.9 % — ABNORMAL HIGH (ref 11.0–14.6)
WBC: 6 10*3/uL (ref 4.0–10.3)

## 2016-03-24 LAB — POTASSIUM (CC13): Potassium: 5.1 mEq/L (ref 3.5–5.1)

## 2016-03-24 MED ORDER — DARBEPOETIN ALFA 300 MCG/0.6ML IJ SOSY
300.0000 ug | PREFILLED_SYRINGE | Freq: Once | INTRAMUSCULAR | Status: AC
  Administered 2016-03-24: 300 ug via SUBCUTANEOUS
  Filled 2016-03-24: qty 0.6

## 2016-03-24 NOTE — Patient Instructions (Signed)

## 2016-03-31 ENCOUNTER — Other Ambulatory Visit (HOSPITAL_BASED_OUTPATIENT_CLINIC_OR_DEPARTMENT_OTHER): Payer: Medicare Other

## 2016-03-31 DIAGNOSIS — D473 Essential (hemorrhagic) thrombocythemia: Secondary | ICD-10-CM

## 2016-03-31 DIAGNOSIS — E876 Hypokalemia: Secondary | ICD-10-CM

## 2016-03-31 DIAGNOSIS — D469 Myelodysplastic syndrome, unspecified: Secondary | ICD-10-CM | POA: Diagnosis not present

## 2016-03-31 LAB — CBC WITH DIFFERENTIAL/PLATELET
BASO%: 0.6 % (ref 0.0–2.0)
BASOS ABS: 0.1 10*3/uL (ref 0.0–0.1)
EOS%: 4.1 % (ref 0.0–7.0)
Eosinophils Absolute: 0.4 10*3/uL (ref 0.0–0.5)
HCT: 27.3 % — ABNORMAL LOW (ref 38.4–49.9)
HEMOGLOBIN: 8.7 g/dL — AB (ref 13.0–17.1)
LYMPH#: 1.9 10*3/uL (ref 0.9–3.3)
LYMPH%: 17.8 % (ref 14.0–49.0)
MCH: 31.9 pg (ref 27.2–33.4)
MCHC: 31.9 g/dL — ABNORMAL LOW (ref 32.0–36.0)
MCV: 100 fL — AB (ref 79.3–98.0)
MONO#: 1.3 10*3/uL — ABNORMAL HIGH (ref 0.1–0.9)
MONO%: 12.7 % (ref 0.0–14.0)
NEUT#: 6.7 10*3/uL — ABNORMAL HIGH (ref 1.5–6.5)
NEUT%: 64.8 % (ref 39.0–75.0)
NRBC: 1 % — AB (ref 0–0)
Platelets: 414 10*3/uL — ABNORMAL HIGH (ref 140–400)
RBC: 2.73 10*6/uL — AB (ref 4.20–5.82)
RDW: 34.1 % — AB (ref 11.0–14.6)
WBC: 10.4 10*3/uL — ABNORMAL HIGH (ref 4.0–10.3)

## 2016-03-31 LAB — POTASSIUM (CC13): Potassium: 4.7 mEq/L (ref 3.5–5.1)

## 2016-04-07 ENCOUNTER — Other Ambulatory Visit: Payer: Medicare Other

## 2016-04-14 ENCOUNTER — Other Ambulatory Visit (HOSPITAL_BASED_OUTPATIENT_CLINIC_OR_DEPARTMENT_OTHER): Payer: Medicare Other

## 2016-04-14 ENCOUNTER — Telehealth: Payer: Self-pay | Admitting: Medical Oncology

## 2016-04-14 ENCOUNTER — Other Ambulatory Visit: Payer: Self-pay | Admitting: *Deleted

## 2016-04-14 ENCOUNTER — Ambulatory Visit (HOSPITAL_BASED_OUTPATIENT_CLINIC_OR_DEPARTMENT_OTHER): Payer: Medicare Other

## 2016-04-14 VITALS — BP 144/80 | HR 64 | Temp 97.7°F | Resp 20

## 2016-04-14 DIAGNOSIS — D469 Myelodysplastic syndrome, unspecified: Secondary | ICD-10-CM | POA: Diagnosis not present

## 2016-04-14 DIAGNOSIS — D473 Essential (hemorrhagic) thrombocythemia: Secondary | ICD-10-CM

## 2016-04-14 DIAGNOSIS — E876 Hypokalemia: Secondary | ICD-10-CM

## 2016-04-14 LAB — CBC WITH DIFFERENTIAL/PLATELET
BASO%: 3.4 % — AB (ref 0.0–2.0)
Basophils Absolute: 0.3 10*3/uL — ABNORMAL HIGH (ref 0.0–0.1)
EOS ABS: 0.4 10*3/uL (ref 0.0–0.5)
EOS%: 5.8 % (ref 0.0–7.0)
HCT: 29.9 % — ABNORMAL LOW (ref 38.4–49.9)
HGB: 9.6 g/dL — ABNORMAL LOW (ref 13.0–17.1)
LYMPH#: 2.1 10*3/uL (ref 0.9–3.3)
LYMPH%: 28.4 % (ref 14.0–49.0)
MCH: 32.3 pg (ref 27.2–33.4)
MCHC: 32.1 g/dL (ref 32.0–36.0)
MCV: 100.7 fL — ABNORMAL HIGH (ref 79.3–98.0)
MONO#: 0.7 10*3/uL (ref 0.1–0.9)
MONO%: 9.8 % (ref 0.0–14.0)
NEUT#: 3.9 10*3/uL (ref 1.5–6.5)
NEUT%: 52.6 % (ref 39.0–75.0)
PLATELETS: 681 10*3/uL — AB (ref 140–400)
RBC: 2.97 10*6/uL — ABNORMAL LOW (ref 4.20–5.82)
WBC: 7.4 10*3/uL (ref 4.0–10.3)
nRBC: 0 % (ref 0–0)

## 2016-04-14 LAB — TECHNOLOGIST REVIEW

## 2016-04-14 LAB — POTASSIUM (CC13): Potassium: 4.7 mEq/L (ref 3.5–5.1)

## 2016-04-14 MED ORDER — DARBEPOETIN ALFA 300 MCG/0.6ML IJ SOSY
300.0000 ug | PREFILLED_SYRINGE | Freq: Once | INTRAMUSCULAR | Status: AC
  Administered 2016-04-14: 300 ug via SUBCUTANEOUS
  Filled 2016-04-14: qty 0.6

## 2016-04-14 NOTE — Telephone Encounter (Signed)
He needs to get a name of a physician there and I will be happy to refer him.

## 2016-04-14 NOTE — Telephone Encounter (Signed)
Wants to get referral for aranesp in [facility].

## 2016-04-14 NOTE — Telephone Encounter (Signed)
Labs faxed to Dr Evelene Croon.

## 2016-04-14 NOTE — Patient Instructions (Signed)
Daratumumab injection What is this medicine? DARATUMUMAB (dar a toom ue mab) is a monoclonal antibody. It is used to treat multiple myeloma. COMMON BRAND NAME(S): DARZALEX What should I tell my health care provider before I take this medicine? They need to know if you have any of these conditions: -infection (especially a virus infection such as chickenpox, cold sores, or herpes) -lung or breathing disease -pregnant or trying to get pregnant -breast-feeding -an unusual or allergic reaction to daratumumab, other medicines, foods, dyes, or preservatives How should I use this medicine? This medicine is for infusion into a vein. It is given by a health care professional in a hospital or clinic setting. Talk to your pediatrician regarding the use of this medicine in children. Special care may be needed. What if I miss a dose? Keep appointments for follow-up doses as directed. It is important not to miss your dose. Call your doctor or health care professional if you are unable to keep an appointment. What may interact with this medicine? Interactions have not been studied. Give your health care provider a list of all the medicines, herbs, non-prescription drugs, or dietary supplements you use. Also tell them if you smoke, drink alcohol, or use illegal drugs. Some items may interact with your medicine. What should I watch for while using this medicine? This drug may make you feel generally unwell. Report any side effects. Continue your course of treatment even though you feel ill unless your doctor tells you to stop. This medicine can cause serious allergic reactions. To reduce your risk you may need to take medicine before treatment with this medicine. Take your medicine as directed. This medicine can affect the results of blood tests to match your blood type. These changes can last for up to 6 months after the final dose. Your healthcare provider will do blood tests to match your blood type before  you start treatment. Tell all of your healthcare providers that you are being treated with this medicine before receiving a blood transfusion. This medicine can affect the results of some tests used to determine treatment response; extra tests may be needed to evaluate response. Do not become pregnant while taking this medicine or for 3 months after stopping it. Women should inform their doctor if they wish to become pregnant or think they might be pregnant. There is a potential for serious side effects to an unborn child. Talk to your health care professional or pharmacist for more information. What side effects may I notice from receiving this medicine? Side effects that you should report to your doctor or health care professional as soon as possible: -allergic reactions like skin rash, itching or hives, swelling of the face, lips, or tongue -breathing problems -chills -cough -dizziness -feeling faint or lightheaded -headache -low blood counts - this medicine may decrease the number of white blood cells, red blood cells and platelets. You may be at increased risk for infections and bleeding. -nausea, vomiting -shortness of breath -signs of decreased platelets or bleeding - bruising, pinpoint red spots on the skin, black, tarry stools, blood in the urine -signs of decreased red blood cells - unusually weak or tired, feeling faint or lightheaded, falls -signs of infection - fever or chills, cough, sore throat, pain or difficulty passing urine Side effects that usually do not require medical attention (report to your doctor or health care professional if they continue or are bothersome): -back pain -diarrhea -muscle cramps -pain, tingling, numbness in the hands or feet -swelling of the ankles,   feet, hands -tiredness Where should I keep my medicine? Keep out of the reach of children. This drug is given in a hospital or clinic and will not be stored at home.  2017 Elsevier/Gold Standard  (2015-04-30 10:38:11)  

## 2016-04-14 NOTE — Progress Notes (Signed)
Dr. Julien Nordmann notified of patient's concern regarding increased platelet count to 681.  Per Dr. Julien Nordmann patient is to f/u with Dr. Evelene Croon at [facility] regarding platelet count.  Patient verbalizes an understanding of MD instructions and has no further questions or concerns at this time.

## 2016-04-15 ENCOUNTER — Encounter: Payer: Self-pay | Admitting: Medical Oncology

## 2016-04-21 ENCOUNTER — Other Ambulatory Visit (HOSPITAL_BASED_OUTPATIENT_CLINIC_OR_DEPARTMENT_OTHER): Payer: Medicare Other

## 2016-04-21 DIAGNOSIS — E876 Hypokalemia: Secondary | ICD-10-CM | POA: Diagnosis not present

## 2016-04-21 DIAGNOSIS — D473 Essential (hemorrhagic) thrombocythemia: Secondary | ICD-10-CM | POA: Diagnosis not present

## 2016-04-21 LAB — CBC WITH DIFFERENTIAL/PLATELET
BASO%: 3.8 % — AB (ref 0.0–2.0)
Basophils Absolute: 0.4 10*3/uL — ABNORMAL HIGH (ref 0.0–0.1)
EOS%: 9.1 % — AB (ref 0.0–7.0)
Eosinophils Absolute: 0.9 10*3/uL — ABNORMAL HIGH (ref 0.0–0.5)
HCT: 34 % — ABNORMAL LOW (ref 38.4–49.9)
HGB: 10.9 g/dL — ABNORMAL LOW (ref 13.0–17.1)
LYMPH%: 22 % (ref 14.0–49.0)
MCH: 32.6 pg (ref 27.2–33.4)
MCHC: 32.1 g/dL (ref 32.0–36.0)
MCV: 101.8 fL — ABNORMAL HIGH (ref 79.3–98.0)
MONO#: 0.9 10*3/uL (ref 0.1–0.9)
MONO%: 8.4 % (ref 0.0–14.0)
NEUT#: 5.8 10*3/uL (ref 1.5–6.5)
NEUT%: 56.7 % (ref 39.0–75.0)
Platelets: 753 10*3/uL — ABNORMAL HIGH (ref 140–400)
RBC: 3.34 10*6/uL — AB (ref 4.20–5.82)
WBC: 10.1 10*3/uL (ref 4.0–10.3)
lymph#: 2.2 10*3/uL (ref 0.9–3.3)
nRBC: 1 % — ABNORMAL HIGH (ref 0–0)

## 2016-04-21 LAB — POTASSIUM (CC13): POTASSIUM: 5.1 meq/L (ref 3.5–5.1)

## 2016-04-22 NOTE — Telephone Encounter (Signed)
Late entry -- pt notified to get name of [facility] onc in [place]. He said he will talk to [facility] first.

## 2016-04-28 ENCOUNTER — Ambulatory Visit (HOSPITAL_BASED_OUTPATIENT_CLINIC_OR_DEPARTMENT_OTHER): Payer: Medicare Other

## 2016-04-28 ENCOUNTER — Other Ambulatory Visit: Payer: Self-pay | Admitting: Medical Oncology

## 2016-04-28 DIAGNOSIS — D473 Essential (hemorrhagic) thrombocythemia: Secondary | ICD-10-CM

## 2016-04-28 DIAGNOSIS — D469 Myelodysplastic syndrome, unspecified: Secondary | ICD-10-CM

## 2016-04-28 DIAGNOSIS — E876 Hypokalemia: Secondary | ICD-10-CM

## 2016-04-28 LAB — CBC WITH DIFFERENTIAL/PLATELET
BASO%: 3.5 % — ABNORMAL HIGH (ref 0.0–2.0)
BASOS ABS: 0.2 10*3/uL — AB (ref 0.0–0.1)
EOS%: 17.6 % — ABNORMAL HIGH (ref 0.0–7.0)
Eosinophils Absolute: 1.2 10*3/uL — ABNORMAL HIGH (ref 0.0–0.5)
HCT: 34.3 % — ABNORMAL LOW (ref 38.4–49.9)
HGB: 11 g/dL — ABNORMAL LOW (ref 13.0–17.1)
LYMPH%: 31.1 % (ref 14.0–49.0)
MCH: 32.9 pg (ref 27.2–33.4)
MCHC: 32.1 g/dL (ref 32.0–36.0)
MCV: 102.7 fL — ABNORMAL HIGH (ref 79.3–98.0)
MONO#: 0.7 10*3/uL (ref 0.1–0.9)
MONO%: 10.1 % (ref 0.0–14.0)
NEUT#: 2.5 10*3/uL (ref 1.5–6.5)
NEUT%: 37.7 % — ABNORMAL LOW (ref 39.0–75.0)
Platelets: 397 10*3/uL (ref 140–400)
RBC: 3.34 10*6/uL — ABNORMAL LOW (ref 4.20–5.82)
RDW: 30 % — AB (ref 11.0–14.6)
WBC: 6.6 10*3/uL (ref 4.0–10.3)
lymph#: 2.1 10*3/uL (ref 0.9–3.3)
nRBC: 0 % (ref 0–0)

## 2016-04-28 LAB — TECHNOLOGIST REVIEW

## 2016-04-28 LAB — POTASSIUM (CC13): Potassium: 4.9 mEq/L (ref 3.5–5.1)

## 2016-05-04 ENCOUNTER — Telehealth: Payer: Self-pay | Admitting: Internal Medicine

## 2016-05-04 NOTE — Telephone Encounter (Signed)
Pt called to cxl 1/25 appt due to being out of town. Pt will call back to r/s at a later date

## 2016-05-05 ENCOUNTER — Other Ambulatory Visit: Payer: Medicare Other

## 2016-05-05 ENCOUNTER — Ambulatory Visit: Payer: Medicare Other

## 2016-05-25 ENCOUNTER — Telehealth: Payer: Self-pay | Admitting: Internal Medicine

## 2016-05-25 NOTE — Telephone Encounter (Signed)
Pt called to sch lab/inj appts for February and March. Pt did not want to r/s at this time

## 2016-05-26 ENCOUNTER — Ambulatory Visit: Payer: Medicare Other

## 2016-05-26 ENCOUNTER — Other Ambulatory Visit: Payer: Medicare Other

## 2016-06-16 ENCOUNTER — Ambulatory Visit: Payer: Medicare Other

## 2016-06-16 ENCOUNTER — Other Ambulatory Visit: Payer: Medicare Other

## 2016-07-07 ENCOUNTER — Ambulatory Visit: Payer: Medicare Other

## 2016-07-07 ENCOUNTER — Other Ambulatory Visit: Payer: Medicare Other

## 2016-07-27 ENCOUNTER — Telehealth: Payer: Self-pay | Admitting: Internal Medicine

## 2016-07-27 NOTE — Telephone Encounter (Signed)
Patient called and said he was unable to make his appointment on 4/19.

## 2016-07-28 ENCOUNTER — Other Ambulatory Visit: Payer: Medicare Other

## 2016-07-28 ENCOUNTER — Ambulatory Visit: Payer: Medicare Other

## 2016-08-02 ENCOUNTER — Telehealth: Payer: Self-pay | Admitting: *Deleted

## 2016-08-02 NOTE — Telephone Encounter (Signed)
Pt left message that he is back from [place] after ~3 months and needs to be scheduled for weekly labs. Per Dr Evelene Croon at [facility] and Dr Julien Nordmann. Please advise.

## 2016-08-02 NOTE — Telephone Encounter (Signed)
Ok to order weekly CBC and CMET. He needs follow up visit with me in 2-3 weeks as available.

## 2016-08-03 ENCOUNTER — Other Ambulatory Visit: Payer: Self-pay | Admitting: Medical Oncology

## 2016-08-03 ENCOUNTER — Telehealth: Payer: Self-pay | Admitting: Internal Medicine

## 2016-08-03 ENCOUNTER — Other Ambulatory Visit (HOSPITAL_COMMUNITY): Payer: Self-pay | Admitting: Internal Medicine

## 2016-08-03 DIAGNOSIS — D469 Myelodysplastic syndrome, unspecified: Secondary | ICD-10-CM

## 2016-08-03 DIAGNOSIS — D473 Essential (hemorrhagic) thrombocythemia: Secondary | ICD-10-CM

## 2016-08-03 NOTE — Telephone Encounter (Signed)
Called patient to inform him of added lab appointments for 4.26 and 5.3. LVM

## 2016-08-04 ENCOUNTER — Other Ambulatory Visit (HOSPITAL_BASED_OUTPATIENT_CLINIC_OR_DEPARTMENT_OTHER): Payer: Medicare Other

## 2016-08-04 DIAGNOSIS — E876 Hypokalemia: Secondary | ICD-10-CM

## 2016-08-04 DIAGNOSIS — D469 Myelodysplastic syndrome, unspecified: Secondary | ICD-10-CM | POA: Diagnosis not present

## 2016-08-04 LAB — COMPREHENSIVE METABOLIC PANEL
ALBUMIN: 4 g/dL (ref 3.5–5.0)
ALT: 23 U/L (ref 0–55)
ANION GAP: 7 meq/L (ref 3–11)
AST: 16 U/L (ref 5–34)
Alkaline Phosphatase: 68 U/L (ref 40–150)
BILIRUBIN TOTAL: 0.93 mg/dL (ref 0.20–1.20)
BUN: 14.7 mg/dL (ref 7.0–26.0)
CALCIUM: 9.5 mg/dL (ref 8.4–10.4)
CO2: 26 mEq/L (ref 22–29)
CREATININE: 0.9 mg/dL (ref 0.7–1.3)
Chloride: 110 mEq/L — ABNORMAL HIGH (ref 98–109)
EGFR: 85 mL/min/{1.73_m2} — ABNORMAL LOW (ref >90)
Glucose: 96 mg/dl (ref 70–140)
Potassium: 5 mEq/L (ref 3.5–5.1)
Sodium: 143 mEq/L (ref 136–145)
TOTAL PROTEIN: 7.1 g/dL (ref 6.4–8.3)

## 2016-08-04 LAB — CBC WITH DIFFERENTIAL/PLATELET
BASO%: 0.1 % (ref 0.0–2.0)
Basophils Absolute: 0 10*3/uL (ref 0.0–0.1)
EOS%: 5.9 % (ref 0.0–7.0)
Eosinophils Absolute: 0.5 10*3/uL (ref 0.0–0.5)
HEMATOCRIT: 33.8 % — AB (ref 38.4–49.9)
HGB: 11 g/dL — ABNORMAL LOW (ref 13.0–17.1)
LYMPH#: 2.3 10*3/uL (ref 0.9–3.3)
LYMPH%: 28.8 % (ref 14.0–49.0)
MCH: 33.4 pg (ref 27.2–33.4)
MCHC: 32.6 g/dL (ref 32.0–36.0)
MCV: 102.2 fL — ABNORMAL HIGH (ref 79.3–98.0)
MONO#: 1 10*3/uL — AB (ref 0.1–0.9)
MONO%: 12.6 % (ref 0.0–14.0)
NEUT%: 52.6 % (ref 39.0–75.0)
NEUTROS ABS: 4.2 10*3/uL (ref 1.5–6.5)
PLATELETS: 505 10*3/uL — AB (ref 140–400)
RBC: 3.3 10*6/uL — ABNORMAL LOW (ref 4.20–5.82)
RDW: 29.4 % — ABNORMAL HIGH (ref 11.0–14.6)
WBC: 7.9 10*3/uL (ref 4.0–10.3)

## 2016-08-04 LAB — TECHNOLOGIST REVIEW

## 2016-08-09 ENCOUNTER — Ambulatory Visit (HOSPITAL_COMMUNITY)
Admission: RE | Admit: 2016-08-09 | Discharge: 2016-08-09 | Disposition: A | Discharge status: Home / Self Care | Payer: Medicare Other | Source: Ambulatory Visit | Attending: Internal Medicine | Admitting: Internal Medicine

## 2016-08-09 DIAGNOSIS — K8020 Calculus of gallbladder without cholecystitis without obstruction: Secondary | ICD-10-CM | POA: Diagnosis not present

## 2016-08-09 DIAGNOSIS — D473 Essential (hemorrhagic) thrombocythemia: Secondary | ICD-10-CM | POA: Diagnosis present

## 2016-08-11 ENCOUNTER — Other Ambulatory Visit (HOSPITAL_BASED_OUTPATIENT_CLINIC_OR_DEPARTMENT_OTHER): Payer: Medicare Other

## 2016-08-11 DIAGNOSIS — D473 Essential (hemorrhagic) thrombocythemia: Secondary | ICD-10-CM | POA: Diagnosis not present

## 2016-08-11 DIAGNOSIS — D469 Myelodysplastic syndrome, unspecified: Secondary | ICD-10-CM | POA: Diagnosis not present

## 2016-08-11 LAB — CBC WITH DIFFERENTIAL/PLATELET
BASO%: 2.6 % — AB (ref 0.0–2.0)
Basophils Absolute: 0.2 10*3/uL — ABNORMAL HIGH (ref 0.0–0.1)
EOS%: 6.8 % (ref 0.0–7.0)
Eosinophils Absolute: 0.5 10*3/uL (ref 0.0–0.5)
HCT: 32.4 % — ABNORMAL LOW (ref 38.4–49.9)
HGB: 10.6 g/dL — ABNORMAL LOW (ref 13.0–17.1)
LYMPH%: 28.1 % (ref 14.0–49.0)
MCH: 31.3 pg (ref 27.2–33.4)
MCHC: 32.7 g/dL (ref 32.0–36.0)
MCV: 95.6 fL (ref 79.3–98.0)
MONO#: 1.4 10*3/uL — ABNORMAL HIGH (ref 0.1–0.9)
MONO%: 18.2 % — ABNORMAL HIGH (ref 0.0–14.0)
NEUT#: 3.4 10*3/uL (ref 1.5–6.5)
NEUT%: 44.3 % (ref 39.0–75.0)
Platelets: 570 10*3/uL — ABNORMAL HIGH (ref 140–400)
RBC: 3.39 10*6/uL — ABNORMAL LOW (ref 4.20–5.82)
RDW: 29 % — ABNORMAL HIGH (ref 11.0–14.6)
WBC: 7.7 10*3/uL (ref 4.0–10.3)
lymph#: 2.2 10*3/uL (ref 0.9–3.3)
nRBC: 1 % — ABNORMAL HIGH (ref 0–0)

## 2016-08-11 LAB — COMPREHENSIVE METABOLIC PANEL
ALT: 21 U/L (ref 0–55)
AST: 17 U/L (ref 5–34)
Albumin: 4.1 g/dL (ref 3.5–5.0)
Alkaline Phosphatase: 66 U/L (ref 40–150)
Anion Gap: 5 mEq/L (ref 3–11)
BUN: 18.3 mg/dL (ref 7.0–26.0)
CALCIUM: 9.4 mg/dL (ref 8.4–10.4)
CHLORIDE: 108 meq/L (ref 98–109)
CO2: 28 mEq/L (ref 22–29)
Creatinine: 0.8 mg/dL (ref 0.7–1.3)
EGFR: 89 mL/min/{1.73_m2} — ABNORMAL LOW (ref >90)
GLUCOSE: 92 mg/dL (ref 70–140)
POTASSIUM: 4.6 meq/L (ref 3.5–5.1)
SODIUM: 140 meq/L (ref 136–145)
Total Bilirubin: 1.21 mg/dL — ABNORMAL HIGH (ref 0.20–1.20)
Total Protein: 7.1 g/dL (ref 6.4–8.3)

## 2016-08-18 ENCOUNTER — Ambulatory Visit: Payer: Medicare Other

## 2016-08-18 ENCOUNTER — Ambulatory Visit (HOSPITAL_BASED_OUTPATIENT_CLINIC_OR_DEPARTMENT_OTHER): Payer: Medicare Other | Admitting: Internal Medicine

## 2016-08-18 ENCOUNTER — Telehealth: Payer: Self-pay | Admitting: Internal Medicine

## 2016-08-18 ENCOUNTER — Encounter: Payer: Self-pay | Admitting: Internal Medicine

## 2016-08-18 ENCOUNTER — Other Ambulatory Visit (HOSPITAL_BASED_OUTPATIENT_CLINIC_OR_DEPARTMENT_OTHER): Payer: Medicare Other

## 2016-08-18 VITALS — BP 142/73 | HR 80 | Temp 98.2°F | Resp 18 | Ht 67.25 in | Wt 161.1 lb

## 2016-08-18 DIAGNOSIS — D469 Myelodysplastic syndrome, unspecified: Secondary | ICD-10-CM | POA: Diagnosis not present

## 2016-08-18 DIAGNOSIS — D473 Essential (hemorrhagic) thrombocythemia: Secondary | ICD-10-CM

## 2016-08-18 DIAGNOSIS — D6481 Anemia due to antineoplastic chemotherapy: Secondary | ICD-10-CM

## 2016-08-18 DIAGNOSIS — T451X5A Adverse effect of antineoplastic and immunosuppressive drugs, initial encounter: Secondary | ICD-10-CM

## 2016-08-18 DIAGNOSIS — R5383 Other fatigue: Secondary | ICD-10-CM

## 2016-08-18 LAB — CBC WITH DIFFERENTIAL/PLATELET
BASO%: 3.3 % — AB (ref 0.0–2.0)
BASOS ABS: 0.3 10*3/uL — AB (ref 0.0–0.1)
EOS%: 8.5 % — AB (ref 0.0–7.0)
Eosinophils Absolute: 0.7 10*3/uL — ABNORMAL HIGH (ref 0.0–0.5)
HEMATOCRIT: 33.2 % — AB (ref 38.4–49.9)
HGB: 10.6 g/dL — ABNORMAL LOW (ref 13.0–17.1)
LYMPH#: 1.8 10*3/uL (ref 0.9–3.3)
LYMPH%: 21.7 % (ref 14.0–49.0)
MCH: 32.3 pg (ref 27.2–33.4)
MCHC: 32 g/dL (ref 32.0–36.0)
MCV: 101.1 fL — ABNORMAL HIGH (ref 79.3–98.0)
MONO#: 0.7 10*3/uL (ref 0.1–0.9)
MONO%: 8.3 % (ref 0.0–14.0)
NEUT#: 4.9 10*3/uL (ref 1.5–6.5)
NEUT%: 58.2 % (ref 39.0–75.0)
Platelets: 586 10*3/uL — ABNORMAL HIGH (ref 140–400)
RBC: 3.29 10*6/uL — ABNORMAL LOW (ref 4.20–5.82)
RDW: 29 % — AB (ref 11.0–14.6)
WBC: 8.4 10*3/uL (ref 4.0–10.3)

## 2016-08-18 LAB — TECHNOLOGIST REVIEW

## 2016-08-18 NOTE — Progress Notes (Signed)
[place]  Telephone:([phone] Fax:(336) [phone]  OFFICE PROGRESS NOTE  [name], MD [address] [place] [address]  DIAGNOSIS: 1.  Myelodysplastic syndrome diagnosed in August 2017 2. Essential thrombocythemia with positive JAK2 mutation 3. Leukocytosis.   PRIOR THERAPY: Previous treatment with combination of Hydrea and anagrelide.  CURRENT THERAPY:  1) Revlimid 5 mg by mouth daily started 03/25/2016. 2) anagrelide 0.5 mg by mouth daily  INTERVAL HISTORY: Roy Johnson 71 y.o. male returns to the clinic today for follow-up visit. The patient has been in [place] for the last few months. He was started  on Revlimid 5 mg by mouth daily in September 2017. He is also on anagrelide 0.5 mg by mouth daily and tolerating this treatment well. He is currently off treatment with hydroxyurea. He continues to have intermittent fatigue. He denied having any fever or chills. He has no nausea, vomiting, diarrhea or constipation. He has no chest pain, shortness of breath, cough or hemoptysis. He is here today for evaluation and repeat blood work.   MEDICAL HISTORY: Past Medical History:  Diagnosis Date  . Anemia   . Colon polyp   . Diverticulitis   . Diverticulosis   . GERD (gastroesophageal reflux disease)   . Hyperlipidemia   . IBS (irritable bowel syndrome)   . Inguinal hernia   . Prostate cancer ([place])                . Prostate cancer ([place])   . Umbilical hernia   . Vitamin D deficiency     ALLERGIES:  has No Known Allergies.  MEDICATIONS:  Current Outpatient Prescriptions  Medication Sig Dispense Refill  . anagrelide (AGRYLIN) 0.5 MG capsule Take 0.5 mg by mouth.    Marland Kitchen aspirin (GOODSENSE ASPIRIN) 325 MG tablet Take 325 mg by mouth daily.    Marland Kitchen aspirin EC 81 MG tablet Take 81 mg by mouth.    . B Complex Vitamins (B COMPLEX-B12 PO) Take by mouth.    . Darbepoetin Alfa (ARANESP) 100 MCG/0.5ML SOSY injection Inject into the skin.    .  fluticasone (FLONASE) 50 MCG/ACT nasal spray 1 spray by Each Nare route daily.    . folic acid (FOLVITE) 0.5 MG tablet Take 1 mg by mouth daily.    . polyethylene glycol powder (GLYCOLAX/MIRALAX) powder   1  . valsartan (DIOVAN) 80 MG tablet take 1/2 tablet by mouth once daily for hypertension    . vitamin B-12 (CYANOCOBALAMIN) 1000 MCG tablet Take by mouth.     No current facility-administered medications for this visit.     SURGICAL HISTORY:  Past Surgical History:  Procedure Laterality Date  . COLONOSCOPY    . POLYPECTOMY    . PROSTATECTOMY    . TONSILLECTOMY      REVIEW OF SYSTEMS:  A comprehensive review of systems was negative except for: Constitutional: positive for fatigue   PHYSICAL EXAMINATION: General appearance: alert, cooperative, appears stated age, fatigued and no distress Head: Normocephalic, without obvious abnormality, atraumatic Neck: no adenopathy, no carotid bruit, no JVD, supple, symmetrical, trachea midline and thyroid not enlarged, symmetric, no tenderness/mass/nodules Lymph nodes: Cervical, supraclavicular, and axillary nodes normal. Resp: clear to auscultation bilaterally Back: symmetric, no curvature. ROM normal. No CVA tenderness. Cardio: regular rate and rhythm, S1, S2 normal, no murmur, click, rub or gallop GI: soft, non-tender; bowel sounds normal; no masses,  no organomegaly Extremities: extremities normal, atraumatic, no cyanosis or edema  ECOG PERFORMANCE STATUS: 1 - Symptomatic but completely  ambulatory  Blood pressure (!) 142/73, pulse 80, temperature 98.2 F (36.8 C), temperature source Oral, resp. rate 18, height 5' 7.25" (1.708 m), weight 161 lb 1.9 oz (73.1 kg), SpO2 99 %.  LABORATORY DATA: Lab Results  Component Value Date   WBC 8.4 08/18/2016   HGB 10.6 (L) 08/18/2016   HCT 33.2 (L) 08/18/2016   MCV 101.1 (H) 08/18/2016   PLT 586 (H) 08/18/2016      Chemistry      Component Value Date/Time   NA 140 08/11/2016 1036   K 4.6  08/11/2016 1036   CO2 28 08/11/2016 1036   BUN 18.3 08/11/2016 1036   CREATININE 0.8 08/11/2016 1036      Component Value Date/Time   CALCIUM 9.4 08/11/2016 1036   ALKPHOS 66 08/11/2016 1036   AST 17 08/11/2016 1036   ALT 21 08/11/2016 1036   BILITOT 1.21 (H) 08/11/2016 1036      RADIOGRAPHIC STUDIES:  ASSESSMENT AND PLAN:  This is a very pleasant 71 years old white male with essential thrombocythemia with positive JAK-2 mutation as well as myelodysplastic syndrome. He is currently on treatment with Revlimid 5 mg by mouth daily according to the recommendation from [facility]. He is also on anagrelide 0.5 mg by mouth daily. The patient has been tolerating this treatment well with no specific complaints except for fatigue. Her CBC today showed persistent mild anemia as well as thrombocytosis. I recommended for the patient to continue his current treatment with Revlimid and anagrelide. He will continue to have blood work done weekly as recommended by his hematologist at [facility] I will see him back for follow-up visit in 4 months for evaluation before he travels back to [place] for the winter time. He was advised to call immediately if he has any concerning symptoms in the interval. All questions were answered. The patient knows to call the clinic with any problems, questions or concerns. We can certainly see the patient much sooner if necessary. I spent 10 minutes counseling the patient face to face. The total time spent in the appointment was 15 minutes.  Disclaimer: This note was dictated with voice recognition software. Similar sounding words can inadvertently be transcribed and may not be corrected upon review.

## 2016-08-18 NOTE — Telephone Encounter (Signed)
Appointments scheduled per 5.10.18 LOS. Patient given AVS report and calendars with future scheduled appointments. °

## 2016-08-25 ENCOUNTER — Other Ambulatory Visit (HOSPITAL_BASED_OUTPATIENT_CLINIC_OR_DEPARTMENT_OTHER): Payer: Medicare Other

## 2016-08-25 DIAGNOSIS — D473 Essential (hemorrhagic) thrombocythemia: Secondary | ICD-10-CM

## 2016-08-25 DIAGNOSIS — D469 Myelodysplastic syndrome, unspecified: Secondary | ICD-10-CM

## 2016-08-25 LAB — CBC WITH DIFFERENTIAL/PLATELET
BASO%: 0.6 % (ref 0.0–2.0)
Basophils Absolute: 0 10*3/uL (ref 0.0–0.1)
EOS%: 9.7 % — AB (ref 0.0–7.0)
Eosinophils Absolute: 0.7 10*3/uL — ABNORMAL HIGH (ref 0.0–0.5)
HCT: 29.2 % — ABNORMAL LOW (ref 38.4–49.9)
HEMOGLOBIN: 9.5 g/dL — AB (ref 13.0–17.1)
LYMPH%: 25.1 % (ref 14.0–49.0)
MCH: 33 pg (ref 27.2–33.4)
MCHC: 32.6 g/dL (ref 32.0–36.0)
MCV: 101.2 fL — ABNORMAL HIGH (ref 79.3–98.0)
MONO#: 1.3 10*3/uL — ABNORMAL HIGH (ref 0.1–0.9)
MONO%: 16.8 % — AB (ref 0.0–14.0)
NEUT%: 47.8 % (ref 39.0–75.0)
NEUTROS ABS: 3.6 10*3/uL (ref 1.5–6.5)
Platelets: 649 10*3/uL — ABNORMAL HIGH (ref 140–400)
RBC: 2.88 10*6/uL — AB (ref 4.20–5.82)
RDW: 28.9 % — ABNORMAL HIGH (ref 11.0–14.6)
WBC: 7.5 10*3/uL (ref 4.0–10.3)
lymph#: 1.9 10*3/uL (ref 0.9–3.3)

## 2016-08-25 LAB — COMPREHENSIVE METABOLIC PANEL
ALT: 23 U/L (ref 0–55)
AST: 17 U/L (ref 5–34)
Albumin: 3.9 g/dL (ref 3.5–5.0)
Alkaline Phosphatase: 60 U/L (ref 40–150)
Anion Gap: 6 mEq/L (ref 3–11)
BILIRUBIN TOTAL: 1.14 mg/dL (ref 0.20–1.20)
BUN: 16.1 mg/dL (ref 7.0–26.0)
CO2: 26 meq/L (ref 22–29)
CREATININE: 0.8 mg/dL (ref 0.7–1.3)
Calcium: 8.8 mg/dL (ref 8.4–10.4)
Chloride: 110 mEq/L — ABNORMAL HIGH (ref 98–109)
EGFR: 90 mL/min/{1.73_m2} — AB (ref >90)
GLUCOSE: 98 mg/dL (ref 70–140)
Potassium: 4.1 mEq/L (ref 3.5–5.1)
Sodium: 141 mEq/L (ref 136–145)
TOTAL PROTEIN: 6.7 g/dL (ref 6.4–8.3)

## 2016-08-25 LAB — TECHNOLOGIST REVIEW

## 2016-08-25 LAB — LACTATE DEHYDROGENASE: LDH: 191 U/L (ref 125–245)

## 2016-09-01 ENCOUNTER — Ambulatory Visit (HOSPITAL_BASED_OUTPATIENT_CLINIC_OR_DEPARTMENT_OTHER): Payer: Medicare Other

## 2016-09-01 ENCOUNTER — Other Ambulatory Visit (HOSPITAL_BASED_OUTPATIENT_CLINIC_OR_DEPARTMENT_OTHER): Payer: Medicare Other

## 2016-09-01 ENCOUNTER — Encounter: Payer: Self-pay | Admitting: Internal Medicine

## 2016-09-01 VITALS — BP 141/65 | HR 69 | Temp 97.8°F | Resp 18

## 2016-09-01 DIAGNOSIS — D469 Myelodysplastic syndrome, unspecified: Secondary | ICD-10-CM

## 2016-09-01 LAB — CBC WITH DIFFERENTIAL/PLATELET
BASO%: 2 % (ref 0.0–2.0)
Basophils Absolute: 0.2 10*3/uL — ABNORMAL HIGH (ref 0.0–0.1)
EOS ABS: 0.6 10*3/uL — AB (ref 0.0–0.5)
EOS%: 7.2 % — ABNORMAL HIGH (ref 0.0–7.0)
HCT: 27.3 % — ABNORMAL LOW (ref 38.4–49.9)
HEMOGLOBIN: 9.1 g/dL — AB (ref 13.0–17.1)
LYMPH%: 26.2 % (ref 14.0–49.0)
MCH: 33.6 pg — ABNORMAL HIGH (ref 27.2–33.4)
MCHC: 33.5 g/dL (ref 32.0–36.0)
MCV: 100.3 fL — ABNORMAL HIGH (ref 79.3–98.0)
MONO#: 1.1 10*3/uL — ABNORMAL HIGH (ref 0.1–0.9)
MONO%: 13.6 % (ref 0.0–14.0)
NEUT%: 51 % (ref 39.0–75.0)
NEUTROS ABS: 4.2 10*3/uL (ref 1.5–6.5)
Platelets: 611 10*3/uL — ABNORMAL HIGH (ref 140–400)
RBC: 2.72 10*6/uL — AB (ref 4.20–5.82)
RDW: 28.6 % — ABNORMAL HIGH (ref 11.0–14.6)
WBC: 8.2 10*3/uL (ref 4.0–10.3)
lymph#: 2.2 10*3/uL (ref 0.9–3.3)

## 2016-09-01 MED ORDER — DARBEPOETIN ALFA 300 MCG/0.6ML IJ SOSY
300.0000 ug | PREFILLED_SYRINGE | Freq: Once | INTRAMUSCULAR | Status: AC
Start: 2016-09-01 — End: 2016-09-01
  Administered 2016-09-01: 300 ug via SUBCUTANEOUS
  Filled 2016-09-01: qty 0.6

## 2016-09-01 NOTE — Patient Instructions (Signed)

## 2016-09-08 ENCOUNTER — Other Ambulatory Visit (HOSPITAL_BASED_OUTPATIENT_CLINIC_OR_DEPARTMENT_OTHER): Payer: Medicare Other

## 2016-09-08 DIAGNOSIS — D473 Essential (hemorrhagic) thrombocythemia: Secondary | ICD-10-CM

## 2016-09-08 DIAGNOSIS — D469 Myelodysplastic syndrome, unspecified: Secondary | ICD-10-CM | POA: Diagnosis not present

## 2016-09-08 LAB — CBC WITH DIFFERENTIAL/PLATELET
BASO%: 1.9 % (ref 0.0–2.0)
Basophils Absolute: 0.2 10*3/uL — ABNORMAL HIGH (ref 0.0–0.1)
EOS%: 8.1 % — AB (ref 0.0–7.0)
Eosinophils Absolute: 0.7 10*3/uL — ABNORMAL HIGH (ref 0.0–0.5)
HCT: 32.2 % — ABNORMAL LOW (ref 38.4–49.9)
HGB: 10.3 g/dL — ABNORMAL LOW (ref 13.0–17.1)
LYMPH%: 23.3 % (ref 14.0–49.0)
MCH: 32.1 pg (ref 27.2–33.4)
MCHC: 32 g/dL (ref 32.0–36.0)
MCV: 100.3 fL — ABNORMAL HIGH (ref 79.3–98.0)
MONO#: 0.8 10*3/uL (ref 0.1–0.9)
MONO%: 10.5 % (ref 0.0–14.0)
NEUT#: 4.5 10*3/uL (ref 1.5–6.5)
NEUT%: 56.2 % (ref 39.0–75.0)
NRBC: 1 % — AB (ref 0–0)
Platelets: 624 10*3/uL — ABNORMAL HIGH (ref 140–400)
RBC: 3.21 10*6/uL — ABNORMAL LOW (ref 4.20–5.82)
RDW: 30.3 % — AB (ref 11.0–14.6)
WBC: 8 10*3/uL (ref 4.0–10.3)
lymph#: 1.9 10*3/uL (ref 0.9–3.3)

## 2016-09-08 LAB — TECHNOLOGIST REVIEW

## 2016-09-15 ENCOUNTER — Other Ambulatory Visit (HOSPITAL_BASED_OUTPATIENT_CLINIC_OR_DEPARTMENT_OTHER): Payer: Medicare Other

## 2016-09-15 DIAGNOSIS — D473 Essential (hemorrhagic) thrombocythemia: Secondary | ICD-10-CM | POA: Diagnosis not present

## 2016-09-15 LAB — CBC WITH DIFFERENTIAL/PLATELET
BASO%: 2.5 % — ABNORMAL HIGH (ref 0.0–2.0)
Basophils Absolute: 0.1 10*3/uL (ref 0.0–0.1)
EOS%: 10.6 % — ABNORMAL HIGH (ref 0.0–7.0)
Eosinophils Absolute: 0.6 10*3/uL — ABNORMAL HIGH (ref 0.0–0.5)
HEMATOCRIT: 34 % — AB (ref 38.4–49.9)
HEMOGLOBIN: 10.9 g/dL — AB (ref 13.0–17.1)
LYMPH%: 30.5 % (ref 14.0–49.0)
MCH: 32.3 pg (ref 27.2–33.4)
MCHC: 32.1 g/dL (ref 32.0–36.0)
MCV: 100.9 fL — ABNORMAL HIGH (ref 79.3–98.0)
MONO#: 0.7 10*3/uL (ref 0.1–0.9)
MONO%: 11.9 % (ref 0.0–14.0)
NEUT#: 2.5 10*3/uL (ref 1.5–6.5)
NEUT%: 44.5 % (ref 39.0–75.0)
PLATELETS: 301 10*3/uL (ref 140–400)
RBC: 3.37 10*6/uL — ABNORMAL LOW (ref 4.20–5.82)
RDW: 29.1 % — AB (ref 11.0–14.6)
WBC: 5.5 10*3/uL (ref 4.0–10.3)
lymph#: 1.7 10*3/uL (ref 0.9–3.3)
nRBC: 0 % (ref 0–0)

## 2016-09-15 LAB — TECHNOLOGIST REVIEW

## 2016-09-22 ENCOUNTER — Other Ambulatory Visit (HOSPITAL_BASED_OUTPATIENT_CLINIC_OR_DEPARTMENT_OTHER): Payer: Medicare Other

## 2016-09-22 DIAGNOSIS — D473 Essential (hemorrhagic) thrombocythemia: Secondary | ICD-10-CM

## 2016-09-22 LAB — CBC WITH DIFFERENTIAL/PLATELET
BASO%: 3.3 % — AB (ref 0.0–2.0)
BASOS ABS: 0.2 10*3/uL — AB (ref 0.0–0.1)
EOS%: 11.8 % — AB (ref 0.0–7.0)
Eosinophils Absolute: 0.8 10*3/uL — ABNORMAL HIGH (ref 0.0–0.5)
HCT: 31.3 % — ABNORMAL LOW (ref 38.4–49.9)
HGB: 10.1 g/dL — ABNORMAL LOW (ref 13.0–17.1)
LYMPH%: 32.2 % (ref 14.0–49.0)
MCH: 32 pg (ref 27.2–33.4)
MCHC: 32.3 g/dL (ref 32.0–36.0)
MCV: 99.1 fL — AB (ref 79.3–98.0)
MONO#: 0.7 10*3/uL (ref 0.1–0.9)
MONO%: 10.2 % (ref 0.0–14.0)
NEUT#: 2.9 10*3/uL (ref 1.5–6.5)
NEUT%: 42.5 % (ref 39.0–75.0)
Platelets: 439 10*3/uL — ABNORMAL HIGH (ref 140–400)
RBC: 3.16 10*6/uL — ABNORMAL LOW (ref 4.20–5.82)
RDW: 28.2 % — ABNORMAL HIGH (ref 11.0–14.6)
WBC: 6.8 10*3/uL (ref 4.0–10.3)
lymph#: 2.2 10*3/uL (ref 0.9–3.3)
nRBC: 0 % (ref 0–0)

## 2016-09-22 LAB — TECHNOLOGIST REVIEW

## 2016-09-29 ENCOUNTER — Other Ambulatory Visit (HOSPITAL_BASED_OUTPATIENT_CLINIC_OR_DEPARTMENT_OTHER): Payer: Medicare Other

## 2016-09-29 DIAGNOSIS — D473 Essential (hemorrhagic) thrombocythemia: Secondary | ICD-10-CM

## 2016-09-29 DIAGNOSIS — D469 Myelodysplastic syndrome, unspecified: Secondary | ICD-10-CM

## 2016-09-29 LAB — CBC WITH DIFFERENTIAL/PLATELET
BASO%: 2.9 % — AB (ref 0.0–2.0)
BASOS ABS: 0.2 10*3/uL — AB (ref 0.0–0.1)
EOS%: 12.5 % — AB (ref 0.0–7.0)
Eosinophils Absolute: 1 10*3/uL — ABNORMAL HIGH (ref 0.0–0.5)
HCT: 30.5 % — ABNORMAL LOW (ref 38.4–49.9)
HGB: 10 g/dL — ABNORMAL LOW (ref 13.0–17.1)
LYMPH%: 24.2 % (ref 14.0–49.0)
MCH: 32.1 pg (ref 27.2–33.4)
MCHC: 32.8 g/dL (ref 32.0–36.0)
MCV: 97.8 fL (ref 79.3–98.0)
MONO#: 1 10*3/uL — ABNORMAL HIGH (ref 0.1–0.9)
MONO%: 13.6 % (ref 0.0–14.0)
NEUT#: 3.6 10*3/uL (ref 1.5–6.5)
NEUT%: 46.8 % (ref 39.0–75.0)
Platelets: 641 10*3/uL — ABNORMAL HIGH (ref 140–400)
RBC: 3.12 10*6/uL — AB (ref 4.20–5.82)
RDW: 28.6 % — AB (ref 11.0–14.6)
WBC: 7.6 10*3/uL (ref 4.0–10.3)
lymph#: 1.8 10*3/uL (ref 0.9–3.3)
nRBC: 0 % (ref 0–0)

## 2016-10-06 ENCOUNTER — Other Ambulatory Visit: Payer: Medicare Other

## 2016-10-13 ENCOUNTER — Other Ambulatory Visit (HOSPITAL_BASED_OUTPATIENT_CLINIC_OR_DEPARTMENT_OTHER): Payer: Medicare Other

## 2016-10-13 DIAGNOSIS — D473 Essential (hemorrhagic) thrombocythemia: Secondary | ICD-10-CM

## 2016-10-13 DIAGNOSIS — D469 Myelodysplastic syndrome, unspecified: Secondary | ICD-10-CM | POA: Diagnosis not present

## 2016-10-13 LAB — CBC WITH DIFFERENTIAL/PLATELET
BASO%: 3.2 % — AB (ref 0.0–2.0)
BASOS ABS: 0.3 10*3/uL — AB (ref 0.0–0.1)
EOS%: 14.8 % — AB (ref 0.0–7.0)
Eosinophils Absolute: 1.2 10*3/uL — ABNORMAL HIGH (ref 0.0–0.5)
HCT: 29.7 % — ABNORMAL LOW (ref 38.4–49.9)
HGB: 9.6 g/dL — ABNORMAL LOW (ref 13.0–17.1)
LYMPH%: 26.1 % (ref 14.0–49.0)
MCH: 31.9 pg (ref 27.2–33.4)
MCHC: 32.3 g/dL (ref 32.0–36.0)
MCV: 98.7 fL — AB (ref 79.3–98.0)
MONO#: 1 10*3/uL — ABNORMAL HIGH (ref 0.1–0.9)
MONO%: 12.6 % (ref 0.0–14.0)
NEUT#: 3.4 10*3/uL (ref 1.5–6.5)
NEUT%: 43.3 % (ref 39.0–75.0)
NRBC: 0 % (ref 0–0)
Platelets: 404 10*3/uL — ABNORMAL HIGH (ref 140–400)
RBC: 3.01 10*6/uL — AB (ref 4.20–5.82)
RDW: 28.3 % — ABNORMAL HIGH (ref 11.0–14.6)
WBC: 7.8 10*3/uL (ref 4.0–10.3)
lymph#: 2.1 10*3/uL (ref 0.9–3.3)

## 2016-10-20 ENCOUNTER — Other Ambulatory Visit (HOSPITAL_BASED_OUTPATIENT_CLINIC_OR_DEPARTMENT_OTHER): Payer: Medicare Other

## 2016-10-20 ENCOUNTER — Ambulatory Visit (HOSPITAL_BASED_OUTPATIENT_CLINIC_OR_DEPARTMENT_OTHER): Payer: Medicare Other

## 2016-10-20 ENCOUNTER — Telehealth: Payer: Self-pay | Admitting: Internal Medicine

## 2016-10-20 VITALS — BP 146/80 | HR 62 | Temp 97.8°F | Resp 18

## 2016-10-20 DIAGNOSIS — D469 Myelodysplastic syndrome, unspecified: Secondary | ICD-10-CM | POA: Diagnosis not present

## 2016-10-20 DIAGNOSIS — D473 Essential (hemorrhagic) thrombocythemia: Secondary | ICD-10-CM

## 2016-10-20 LAB — CBC WITH DIFFERENTIAL/PLATELET
BASO%: 2.5 % — ABNORMAL HIGH (ref 0.0–2.0)
BASOS ABS: 0.2 10*3/uL — AB (ref 0.0–0.1)
EOS ABS: 1 10*3/uL — AB (ref 0.0–0.5)
EOS%: 13.6 % — ABNORMAL HIGH (ref 0.0–7.0)
HCT: 29.1 % — ABNORMAL LOW (ref 38.4–49.9)
HGB: 9.5 g/dL — ABNORMAL LOW (ref 13.0–17.1)
LYMPH%: 28.7 % (ref 14.0–49.0)
MCH: 31.9 pg (ref 27.2–33.4)
MCHC: 32.6 g/dL (ref 32.0–36.0)
MCV: 97.7 fL (ref 79.3–98.0)
MONO#: 0.8 10*3/uL (ref 0.1–0.9)
MONO%: 10.9 % (ref 0.0–14.0)
NEUT#: 3.2 10*3/uL (ref 1.5–6.5)
NEUT%: 44.3 % (ref 39.0–75.0)
NRBC: 0 % (ref 0–0)
PLATELETS: 316 10*3/uL (ref 140–400)
RBC: 2.98 10*6/uL — AB (ref 4.20–5.82)
RDW: 28.1 % — AB (ref 11.0–14.6)
WBC: 7.2 10*3/uL (ref 4.0–10.3)
lymph#: 2.1 10*3/uL (ref 0.9–3.3)

## 2016-10-20 MED ORDER — DARBEPOETIN ALFA 300 MCG/0.6ML IJ SOSY
300.0000 ug | PREFILLED_SYRINGE | Freq: Once | INTRAMUSCULAR | Status: AC
  Administered 2016-10-20: 300 ug via SUBCUTANEOUS
  Filled 2016-10-20: qty 0.6

## 2016-10-20 NOTE — Patient Instructions (Signed)

## 2016-10-20 NOTE — Telephone Encounter (Signed)
R/s appt per sch message from Kaleen Odea on 7/6 - patient is aware of appt date and time and will check my chart.

## 2016-10-27 ENCOUNTER — Other Ambulatory Visit (HOSPITAL_BASED_OUTPATIENT_CLINIC_OR_DEPARTMENT_OTHER): Payer: Medicare Other

## 2016-10-27 DIAGNOSIS — D473 Essential (hemorrhagic) thrombocythemia: Secondary | ICD-10-CM

## 2016-10-27 DIAGNOSIS — D469 Myelodysplastic syndrome, unspecified: Secondary | ICD-10-CM | POA: Diagnosis not present

## 2016-10-27 LAB — CBC WITH DIFFERENTIAL/PLATELET
BASO%: 2.4 % — AB (ref 0.0–2.0)
Basophils Absolute: 0.2 10*3/uL — ABNORMAL HIGH (ref 0.0–0.1)
EOS%: 13.1 % — AB (ref 0.0–7.0)
Eosinophils Absolute: 1 10*3/uL — ABNORMAL HIGH (ref 0.0–0.5)
HCT: 28.4 % — ABNORMAL LOW (ref 38.4–49.9)
HGB: 9.3 g/dL — ABNORMAL LOW (ref 13.0–17.1)
LYMPH%: 21.2 % (ref 14.0–49.0)
MCH: 32.2 pg (ref 27.2–33.4)
MCHC: 32.7 g/dL (ref 32.0–36.0)
MCV: 98.3 fL — ABNORMAL HIGH (ref 79.3–98.0)
MONO#: 1 10*3/uL — ABNORMAL HIGH (ref 0.1–0.9)
MONO%: 12.5 % (ref 0.0–14.0)
NEUT%: 50.8 % (ref 39.0–75.0)
NEUTROS ABS: 4 10*3/uL (ref 1.5–6.5)
NRBC: 1 % — AB (ref 0–0)
Platelets: 350 10*3/uL (ref 140–400)
RBC: 2.89 10*6/uL — AB (ref 4.20–5.82)
RDW: 28.8 % — AB (ref 11.0–14.6)
WBC: 7.9 10*3/uL (ref 4.0–10.3)
lymph#: 1.7 10*3/uL (ref 0.9–3.3)

## 2016-10-27 LAB — TECHNOLOGIST REVIEW

## 2016-11-03 ENCOUNTER — Other Ambulatory Visit (HOSPITAL_BASED_OUTPATIENT_CLINIC_OR_DEPARTMENT_OTHER): Payer: Medicare Other

## 2016-11-03 DIAGNOSIS — D469 Myelodysplastic syndrome, unspecified: Secondary | ICD-10-CM

## 2016-11-03 DIAGNOSIS — D473 Essential (hemorrhagic) thrombocythemia: Secondary | ICD-10-CM

## 2016-11-03 LAB — CBC WITH DIFFERENTIAL/PLATELET
BASO%: 2.1 % — ABNORMAL HIGH (ref 0.0–2.0)
Basophils Absolute: 0.1 10*3/uL (ref 0.0–0.1)
EOS ABS: 1 10*3/uL — AB (ref 0.0–0.5)
EOS%: 16.2 % — ABNORMAL HIGH (ref 0.0–7.0)
HCT: 28 % — ABNORMAL LOW (ref 38.4–49.9)
HGB: 9 g/dL — ABNORMAL LOW (ref 13.0–17.1)
LYMPH%: 26.9 % (ref 14.0–49.0)
MCH: 32.1 pg (ref 27.2–33.4)
MCHC: 32.1 g/dL (ref 32.0–36.0)
MCV: 100 fL — ABNORMAL HIGH (ref 79.3–98.0)
MONO#: 0.7 10*3/uL (ref 0.1–0.9)
MONO%: 11 % (ref 0.0–14.0)
NEUT#: 2.7 10*3/uL (ref 1.5–6.5)
NEUT%: 43.8 % (ref 39.0–75.0)
PLATELETS: 244 10*3/uL (ref 140–400)
RBC: 2.8 10*6/uL — ABNORMAL LOW (ref 4.20–5.82)
RDW: 28.7 % — ABNORMAL HIGH (ref 11.0–14.6)
WBC: 6.1 10*3/uL (ref 4.0–10.3)
lymph#: 1.6 10*3/uL (ref 0.9–3.3)
nRBC: 0 % (ref 0–0)

## 2016-11-03 LAB — TECHNOLOGIST REVIEW

## 2016-11-09 ENCOUNTER — Other Ambulatory Visit: Payer: Self-pay | Admitting: Medical Oncology

## 2016-11-09 ENCOUNTER — Telehealth: Payer: Self-pay | Admitting: Medical Oncology

## 2016-11-09 NOTE — Telephone Encounter (Signed)
Returned call and will evaluate need for aranesp in am after labs.

## 2016-11-10 ENCOUNTER — Other Ambulatory Visit (HOSPITAL_BASED_OUTPATIENT_CLINIC_OR_DEPARTMENT_OTHER): Payer: Medicare Other

## 2016-11-10 ENCOUNTER — Ambulatory Visit (HOSPITAL_BASED_OUTPATIENT_CLINIC_OR_DEPARTMENT_OTHER): Payer: Medicare Other

## 2016-11-10 VITALS — BP 141/64 | HR 70 | Temp 97.9°F | Resp 18

## 2016-11-10 DIAGNOSIS — D469 Myelodysplastic syndrome, unspecified: Secondary | ICD-10-CM

## 2016-11-10 DIAGNOSIS — D473 Essential (hemorrhagic) thrombocythemia: Secondary | ICD-10-CM | POA: Diagnosis not present

## 2016-11-10 LAB — CBC WITH DIFFERENTIAL/PLATELET
BASO%: 2.2 % — ABNORMAL HIGH (ref 0.0–2.0)
BASOS ABS: 0.1 10*3/uL (ref 0.0–0.1)
EOS%: 16.6 % — ABNORMAL HIGH (ref 0.0–7.0)
Eosinophils Absolute: 1 10*3/uL — ABNORMAL HIGH (ref 0.0–0.5)
HCT: 27.7 % — ABNORMAL LOW (ref 38.4–49.9)
HEMOGLOBIN: 9.1 g/dL — AB (ref 13.0–17.1)
LYMPH#: 1.4 10*3/uL (ref 0.9–3.3)
LYMPH%: 24.7 % (ref 14.0–49.0)
MCH: 33 pg (ref 27.2–33.4)
MCHC: 32.9 g/dL (ref 32.0–36.0)
MCV: 100.4 fL — AB (ref 79.3–98.0)
MONO#: 0.7 10*3/uL (ref 0.1–0.9)
MONO%: 11.7 % (ref 0.0–14.0)
NEUT#: 2.6 10*3/uL (ref 1.5–6.5)
NEUT%: 44.8 % (ref 39.0–75.0)
NRBC: 0 % (ref 0–0)
PLATELETS: 269 10*3/uL (ref 140–400)
RBC: 2.76 10*6/uL — ABNORMAL LOW (ref 4.20–5.82)
RDW: 28.6 % — AB (ref 11.0–14.6)
WBC: 5.8 10*3/uL (ref 4.0–10.3)

## 2016-11-10 LAB — TECHNOLOGIST REVIEW

## 2016-11-10 MED ORDER — DARBEPOETIN ALFA 300 MCG/0.6ML IJ SOSY
300.0000 ug | PREFILLED_SYRINGE | Freq: Once | INTRAMUSCULAR | Status: AC
  Administered 2016-11-10: 300 ug via SUBCUTANEOUS
  Filled 2016-11-10: qty 0.6

## 2016-11-17 ENCOUNTER — Other Ambulatory Visit: Payer: Medicare Other

## 2016-11-24 ENCOUNTER — Other Ambulatory Visit: Payer: Medicare Other

## 2016-12-01 ENCOUNTER — Other Ambulatory Visit: Payer: Self-pay | Admitting: Medical Oncology

## 2016-12-01 ENCOUNTER — Telehealth: Payer: Self-pay | Admitting: Medical Oncology

## 2016-12-01 ENCOUNTER — Ambulatory Visit (HOSPITAL_BASED_OUTPATIENT_CLINIC_OR_DEPARTMENT_OTHER): Payer: Medicare Other

## 2016-12-01 ENCOUNTER — Other Ambulatory Visit (HOSPITAL_BASED_OUTPATIENT_CLINIC_OR_DEPARTMENT_OTHER): Payer: Medicare Other

## 2016-12-01 DIAGNOSIS — D469 Myelodysplastic syndrome, unspecified: Secondary | ICD-10-CM

## 2016-12-01 DIAGNOSIS — D473 Essential (hemorrhagic) thrombocythemia: Secondary | ICD-10-CM

## 2016-12-01 LAB — CBC WITH DIFFERENTIAL/PLATELET
BASO%: 3.2 % — ABNORMAL HIGH (ref 0.0–2.0)
BASOS ABS: 0.2 10*3/uL — AB (ref 0.0–0.1)
EOS ABS: 0.8 10*3/uL — AB (ref 0.0–0.5)
EOS%: 17 % — ABNORMAL HIGH (ref 0.0–7.0)
HEMATOCRIT: 28.2 % — AB (ref 38.4–49.9)
HGB: 9.3 g/dL — ABNORMAL LOW (ref 13.0–17.1)
LYMPH#: 1.3 10*3/uL (ref 0.9–3.3)
LYMPH%: 28.4 % (ref 14.0–49.0)
MCH: 35.4 pg — AB (ref 27.2–33.4)
MCHC: 33.2 g/dL (ref 32.0–36.0)
MCV: 106.8 fL — AB (ref 79.3–98.0)
MONO#: 0.5 10*3/uL (ref 0.1–0.9)
MONO%: 11.6 % (ref 0.0–14.0)
NEUT%: 39.8 % (ref 39.0–75.0)
NEUTROS ABS: 1.9 10*3/uL (ref 1.5–6.5)
Platelets: 360 10*3/uL (ref 140–400)
RBC: 2.64 10*6/uL — ABNORMAL LOW (ref 4.20–5.82)
RDW: 27.4 % — AB (ref 11.0–14.6)
WBC: 4.7 10*3/uL (ref 4.0–10.3)

## 2016-12-01 MED ORDER — DARBEPOETIN ALFA 300 MCG/0.6ML IJ SOSY
300.0000 ug | PREFILLED_SYRINGE | Freq: Once | INTRAMUSCULAR | Status: AC
  Administered 2016-12-01: 300 ug via SUBCUTANEOUS
  Filled 2016-12-01: qty 0.6

## 2016-12-01 NOTE — Patient Instructions (Signed)

## 2016-12-01 NOTE — Telephone Encounter (Signed)
Called pt in lobby , no answer. I left phone message to come for injection Aranesp appt now or to call back and r/s.

## 2016-12-08 ENCOUNTER — Other Ambulatory Visit (HOSPITAL_BASED_OUTPATIENT_CLINIC_OR_DEPARTMENT_OTHER): Payer: Medicare Other

## 2016-12-08 DIAGNOSIS — D6481 Anemia due to antineoplastic chemotherapy: Secondary | ICD-10-CM

## 2016-12-08 DIAGNOSIS — D473 Essential (hemorrhagic) thrombocythemia: Secondary | ICD-10-CM | POA: Diagnosis not present

## 2016-12-08 DIAGNOSIS — T451X5A Adverse effect of antineoplastic and immunosuppressive drugs, initial encounter: Secondary | ICD-10-CM

## 2016-12-08 DIAGNOSIS — R5383 Other fatigue: Secondary | ICD-10-CM

## 2016-12-08 DIAGNOSIS — D469 Myelodysplastic syndrome, unspecified: Secondary | ICD-10-CM

## 2016-12-08 LAB — CBC WITH DIFFERENTIAL/PLATELET
BASO%: 0.7 % (ref 0.0–2.0)
BASOS ABS: 0 10*3/uL (ref 0.0–0.1)
EOS%: 17.9 % — AB (ref 0.0–7.0)
Eosinophils Absolute: 0.9 10*3/uL — ABNORMAL HIGH (ref 0.0–0.5)
HCT: 26.1 % — ABNORMAL LOW (ref 38.4–49.9)
HEMOGLOBIN: 8.6 g/dL — AB (ref 13.0–17.1)
LYMPH#: 1.4 10*3/uL (ref 0.9–3.3)
LYMPH%: 26.3 % (ref 14.0–49.0)
MCH: 35.3 pg — ABNORMAL HIGH (ref 27.2–33.4)
MCHC: 33 g/dL (ref 32.0–36.0)
MCV: 106.9 fL — ABNORMAL HIGH (ref 79.3–98.0)
MONO#: 0.5 10*3/uL (ref 0.1–0.9)
MONO%: 9.7 % (ref 0.0–14.0)
NEUT#: 2.4 10*3/uL (ref 1.5–6.5)
NEUT%: 45.4 % (ref 39.0–75.0)
Platelets: 384 10*3/uL (ref 140–400)
RBC: 2.44 10*6/uL — ABNORMAL LOW (ref 4.20–5.82)
RDW: 26.5 % — ABNORMAL HIGH (ref 11.0–14.6)
WBC: 5.2 10*3/uL (ref 4.0–10.3)

## 2016-12-08 LAB — COMPREHENSIVE METABOLIC PANEL
ALBUMIN: 3.7 g/dL (ref 3.5–5.0)
ALK PHOS: 87 U/L (ref 40–150)
ALT: 41 U/L (ref 0–55)
AST: 25 U/L (ref 5–34)
Anion Gap: 6 mEq/L (ref 3–11)
BILIRUBIN TOTAL: 0.84 mg/dL (ref 0.20–1.20)
BUN: 14.1 mg/dL (ref 7.0–26.0)
CO2: 27 mEq/L (ref 22–29)
CREATININE: 0.9 mg/dL (ref 0.7–1.3)
Calcium: 9 mg/dL (ref 8.4–10.4)
Chloride: 108 mEq/L (ref 98–109)
EGFR: 86 mL/min/{1.73_m2} — AB (ref >90)
GLUCOSE: 150 mg/dL — AB (ref 70–140)
Potassium: 4.5 mEq/L (ref 3.5–5.1)
SODIUM: 141 meq/L (ref 136–145)
TOTAL PROTEIN: 6.6 g/dL (ref 6.4–8.3)

## 2016-12-08 LAB — LACTATE DEHYDROGENASE: LDH: 163 U/L (ref 125–245)

## 2016-12-13 ENCOUNTER — Other Ambulatory Visit (HOSPITAL_BASED_OUTPATIENT_CLINIC_OR_DEPARTMENT_OTHER): Payer: Medicare Other

## 2016-12-13 DIAGNOSIS — D469 Myelodysplastic syndrome, unspecified: Secondary | ICD-10-CM | POA: Diagnosis not present

## 2016-12-13 DIAGNOSIS — D473 Essential (hemorrhagic) thrombocythemia: Secondary | ICD-10-CM | POA: Diagnosis not present

## 2016-12-13 LAB — CBC WITH DIFFERENTIAL/PLATELET
BASO%: 2.6 % — ABNORMAL HIGH (ref 0.0–2.0)
BASOS ABS: 0.1 10*3/uL (ref 0.0–0.1)
EOS ABS: 0.1 10*3/uL (ref 0.0–0.5)
EOS%: 3.5 % (ref 0.0–7.0)
HCT: 27.2 % — ABNORMAL LOW (ref 38.4–49.9)
HGB: 8.8 g/dL — ABNORMAL LOW (ref 13.0–17.1)
LYMPH%: 41.5 % (ref 14.0–49.0)
MCH: 34.9 pg — AB (ref 27.2–33.4)
MCHC: 32.6 g/dL (ref 32.0–36.0)
MCV: 107.3 fL — AB (ref 79.3–98.0)
MONO#: 0.4 10*3/uL (ref 0.1–0.9)
MONO%: 11.6 % (ref 0.0–14.0)
NEUT#: 1.5 10*3/uL (ref 1.5–6.5)
NEUT%: 40.8 % (ref 39.0–75.0)
Platelets: 407 10*3/uL — ABNORMAL HIGH (ref 140–400)
RBC: 2.53 10*6/uL — ABNORMAL LOW (ref 4.20–5.82)
RDW: 27.1 % — ABNORMAL HIGH (ref 11.0–14.6)
WBC: 3.8 10*3/uL — ABNORMAL LOW (ref 4.0–10.3)
lymph#: 1.6 10*3/uL (ref 0.9–3.3)

## 2016-12-15 ENCOUNTER — Other Ambulatory Visit: Payer: Medicare Other

## 2016-12-15 ENCOUNTER — Ambulatory Visit: Payer: Medicare Other | Admitting: Internal Medicine

## 2016-12-19 ENCOUNTER — Telehealth: Payer: Self-pay | Admitting: Internal Medicine

## 2016-12-19 NOTE — Telephone Encounter (Signed)
lvm to inform ptof lab only appt 9/11 at 130 pm per sch msg

## 2016-12-20 ENCOUNTER — Telehealth: Payer: Self-pay | Admitting: Internal Medicine

## 2016-12-20 ENCOUNTER — Ambulatory Visit (HOSPITAL_BASED_OUTPATIENT_CLINIC_OR_DEPARTMENT_OTHER): Payer: Medicare Other

## 2016-12-20 ENCOUNTER — Other Ambulatory Visit (HOSPITAL_BASED_OUTPATIENT_CLINIC_OR_DEPARTMENT_OTHER): Payer: Medicare Other

## 2016-12-20 ENCOUNTER — Other Ambulatory Visit: Payer: Self-pay | Admitting: Medical Oncology

## 2016-12-20 DIAGNOSIS — D473 Essential (hemorrhagic) thrombocythemia: Secondary | ICD-10-CM

## 2016-12-20 DIAGNOSIS — D469 Myelodysplastic syndrome, unspecified: Secondary | ICD-10-CM

## 2016-12-20 LAB — CBC WITH DIFFERENTIAL/PLATELET
BASO%: 4.3 % — ABNORMAL HIGH (ref 0.0–2.0)
BASOS ABS: 0.2 10*3/uL — AB (ref 0.0–0.1)
EOS%: 10.8 % — AB (ref 0.0–7.0)
Eosinophils Absolute: 0.6 10*3/uL — ABNORMAL HIGH (ref 0.0–0.5)
HCT: 23 % — ABNORMAL LOW (ref 38.4–49.9)
HGB: 7.6 g/dL — ABNORMAL LOW (ref 13.0–17.1)
LYMPH#: 1.4 10*3/uL (ref 0.9–3.3)
LYMPH%: 26.4 % (ref 14.0–49.0)
MCH: 35.6 pg — AB (ref 27.2–33.4)
MCHC: 33 g/dL (ref 32.0–36.0)
MCV: 108.1 fL — ABNORMAL HIGH (ref 79.3–98.0)
MONO#: 0.6 10*3/uL (ref 0.1–0.9)
MONO%: 12 % (ref 0.0–14.0)
NEUT#: 2.4 10*3/uL (ref 1.5–6.5)
NEUT%: 46.5 % (ref 39.0–75.0)
Platelets: 397 10*3/uL (ref 140–400)
RBC: 2.12 10*6/uL — AB (ref 4.20–5.82)
RDW: 26.6 % — ABNORMAL HIGH (ref 11.0–14.6)
WBC: 5.2 10*3/uL (ref 4.0–10.3)

## 2016-12-20 MED ORDER — DARBEPOETIN ALFA 300 MCG/0.6ML IJ SOSY
300.0000 ug | PREFILLED_SYRINGE | Freq: Once | INTRAMUSCULAR | Status: DC
  Administered 2016-12-20: 300 ug via SUBCUTANEOUS
  Filled 2016-12-20: qty 0.6

## 2016-12-20 NOTE — Patient Instructions (Signed)

## 2016-12-20 NOTE — Telephone Encounter (Signed)
Gave patient calendar of upcoming September appointments.

## 2016-12-20 NOTE — Plan of Care (Signed)
Reviewed labs and sent schedule request for type and cross and transfusion this week.

## 2016-12-21 ENCOUNTER — Other Ambulatory Visit: Payer: Self-pay | Admitting: Medical Oncology

## 2016-12-21 DIAGNOSIS — D649 Anemia, unspecified: Secondary | ICD-10-CM

## 2016-12-22 ENCOUNTER — Other Ambulatory Visit (HOSPITAL_BASED_OUTPATIENT_CLINIC_OR_DEPARTMENT_OTHER): Payer: Medicare Other

## 2016-12-22 DIAGNOSIS — D469 Myelodysplastic syndrome, unspecified: Secondary | ICD-10-CM

## 2016-12-22 LAB — CBC WITH DIFFERENTIAL/PLATELET
BASO%: 3 % — ABNORMAL HIGH (ref 0.0–2.0)
Basophils Absolute: 0.1 10*3/uL (ref 0.0–0.1)
EOS ABS: 0.5 10*3/uL (ref 0.0–0.5)
EOS%: 13.5 % — ABNORMAL HIGH (ref 0.0–7.0)
HCT: 25.1 % — ABNORMAL LOW (ref 38.4–49.9)
HGB: 8.4 g/dL — ABNORMAL LOW (ref 13.0–17.1)
LYMPH%: 29.7 % (ref 14.0–49.0)
MCH: 37 pg — ABNORMAL HIGH (ref 27.2–33.4)
MCHC: 33.6 g/dL (ref 32.0–36.0)
MCV: 110 fL — AB (ref 79.3–98.0)
MONO#: 0.4 10*3/uL (ref 0.1–0.9)
MONO%: 9.7 % (ref 0.0–14.0)
NEUT%: 44.1 % (ref 39.0–75.0)
NEUTROS ABS: 1.7 10*3/uL (ref 1.5–6.5)
Platelets: 403 10*3/uL — ABNORMAL HIGH (ref 140–400)
RBC: 2.28 10*6/uL — AB (ref 4.20–5.82)
RDW: 27.3 % — ABNORMAL HIGH (ref 11.0–14.6)
WBC: 3.9 10*3/uL — AB (ref 4.0–10.3)
lymph#: 1.2 10*3/uL (ref 0.9–3.3)

## 2016-12-23 ENCOUNTER — Ambulatory Visit (HOSPITAL_BASED_OUTPATIENT_CLINIC_OR_DEPARTMENT_OTHER): Payer: Medicare Other

## 2016-12-23 ENCOUNTER — Other Ambulatory Visit: Payer: Self-pay | Admitting: *Deleted

## 2016-12-23 ENCOUNTER — Other Ambulatory Visit: Payer: Self-pay | Admitting: Medical Oncology

## 2016-12-23 ENCOUNTER — Ambulatory Visit: Payer: Medicare Other

## 2016-12-23 ENCOUNTER — Ambulatory Visit (HOSPITAL_COMMUNITY)
Admission: RE | Admit: 2016-12-23 | Discharge: 2016-12-23 | Disposition: A | Discharge status: Home / Self Care | Payer: Medicare Other | Source: Ambulatory Visit | Attending: Internal Medicine | Admitting: Internal Medicine

## 2016-12-23 VITALS — BP 154/77 | HR 57 | Temp 98.0°F | Resp 18

## 2016-12-23 DIAGNOSIS — D649 Anemia, unspecified: Secondary | ICD-10-CM

## 2016-12-23 LAB — PREPARE RBC (CROSSMATCH)

## 2016-12-23 MED ORDER — ACETAMINOPHEN 325 MG PO TABS
ORAL_TABLET | ORAL | Status: AC
  Filled 2016-12-23: qty 2

## 2016-12-23 MED ORDER — DIPHENHYDRAMINE HCL 25 MG PO CAPS
ORAL_CAPSULE | ORAL | Status: AC
  Filled 2016-12-23: qty 1

## 2016-12-23 MED ORDER — SODIUM CHLORIDE 0.9 % IV SOLN: 250.0000 mL | Freq: Once | INTRAVENOUS | Status: DC

## 2016-12-23 MED ORDER — ACETAMINOPHEN 325 MG PO TABS
650.0000 mg | ORAL_TABLET | Freq: Once | ORAL | Status: AC
  Administered 2016-12-23: 650 mg via ORAL

## 2016-12-23 MED ORDER — DIPHENHYDRAMINE HCL 25 MG PO CAPS
25.0000 mg | ORAL_CAPSULE | Freq: Once | ORAL | Status: AC
  Administered 2016-12-23: 25 mg via ORAL

## 2016-12-23 MED ORDER — SODIUM CHLORIDE 0.9 % IV SOLN
250.0000 mL | Freq: Once | INTRAVENOUS | Status: AC
  Administered 2016-12-23: 250 mL via INTRAVENOUS

## 2016-12-23 NOTE — Addendum Note (Signed)
Addended by: Laureen Abrahams on: 12/23/2016 05:48 PM   Modules accepted: Orders

## 2016-12-23 NOTE — Patient Instructions (Signed)

## 2016-12-23 NOTE — Addendum Note (Signed)
Addended by: Laureen Abrahams on: 12/23/2016 01:59 PM   Modules accepted: Orders

## 2016-12-24 LAB — BPAM RBC
BLOOD PRODUCT EXPIRATION DATE: 201810142359
Blood Product Expiration Date: 201810142359
ISSUE DATE / TIME: 201809141505
ISSUE DATE / TIME: 201809141558
UNIT TYPE AND RH: 5100
Unit Type and Rh: 5100

## 2016-12-24 LAB — TYPE AND SCREEN
ABO/RH(D): O POS
Antibody Screen: NEGATIVE
UNIT DIVISION: 0
Unit division: 0

## 2017-01-03 ENCOUNTER — Other Ambulatory Visit: Payer: Medicare Other

## 2017-01-03 ENCOUNTER — Ambulatory Visit: Payer: Medicare Other | Admitting: Internal Medicine

## 2017-01-26 MED ORDER — MORPHINE SULFATE (PF) 4 MG/ML IV SOLN
INTRAVENOUS | Status: AC
  Filled 2017-01-26: qty 1

## 2017-03-06 ENCOUNTER — Other Ambulatory Visit: Payer: Medicare Other

## 2017-03-06 ENCOUNTER — Ambulatory Visit: Payer: Medicare Other | Admitting: Internal Medicine

## 2017-03-28 ENCOUNTER — Other Ambulatory Visit: Payer: Self-pay | Admitting: Internal Medicine

## 2017-03-28 ENCOUNTER — Telehealth: Payer: Self-pay | Admitting: Internal Medicine

## 2017-03-28 ENCOUNTER — Ambulatory Visit (HOSPITAL_BASED_OUTPATIENT_CLINIC_OR_DEPARTMENT_OTHER): Payer: Medicare Other | Admitting: Internal Medicine

## 2017-03-28 ENCOUNTER — Encounter: Payer: Self-pay | Admitting: Internal Medicine

## 2017-03-28 ENCOUNTER — Other Ambulatory Visit (HOSPITAL_BASED_OUTPATIENT_CLINIC_OR_DEPARTMENT_OTHER): Payer: Medicare Other

## 2017-03-28 VITALS — BP 132/66 | HR 74 | Temp 97.8°F | Resp 20 | Ht 67.0 in | Wt 161.1 lb

## 2017-03-28 DIAGNOSIS — R5382 Chronic fatigue, unspecified: Secondary | ICD-10-CM

## 2017-03-28 DIAGNOSIS — D469 Myelodysplastic syndrome, unspecified: Secondary | ICD-10-CM

## 2017-03-28 DIAGNOSIS — D473 Essential (hemorrhagic) thrombocythemia: Secondary | ICD-10-CM

## 2017-03-28 LAB — CBC WITH DIFFERENTIAL/PLATELET
BASO%: 0.8 % (ref 0.0–2.0)
BASOS ABS: 0 10*3/uL (ref 0.0–0.1)
EOS ABS: 0.6 10*3/uL — AB (ref 0.0–0.5)
EOS%: 11.5 % — AB (ref 0.0–7.0)
HEMATOCRIT: 32.2 % — AB (ref 38.4–49.9)
HEMOGLOBIN: 10.6 g/dL — AB (ref 13.0–17.1)
LYMPH#: 1.7 10*3/uL (ref 0.9–3.3)
LYMPH%: 33.3 % (ref 14.0–49.0)
MCH: 35.2 pg — AB (ref 27.2–33.4)
MCHC: 32.9 g/dL (ref 32.0–36.0)
MCV: 107 fL — ABNORMAL HIGH (ref 79.3–98.0)
MONO#: 0.8 10*3/uL (ref 0.1–0.9)
MONO%: 15.7 % — ABNORMAL HIGH (ref 0.0–14.0)
NEUT#: 2 10*3/uL (ref 1.5–6.5)
NEUT%: 38.7 % — AB (ref 39.0–75.0)
NRBC: 0 % (ref 0–0)
PLATELETS: 447 10*3/uL — AB (ref 140–400)
RBC: 3.01 10*6/uL — ABNORMAL LOW (ref 4.20–5.82)
RDW: 26 % — AB (ref 11.0–14.6)
WBC: 5.1 10*3/uL (ref 4.0–10.3)

## 2017-03-28 NOTE — Progress Notes (Addendum)
[place]  Telephone:(336) [phone] Fax:(336) [phone]  OFFICE PROGRESS NOTE  [name], MD [address] [place] [address]  DIAGNOSIS: 1.  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 Essential thrombocythemia with positive JAK2 mutation 3. Leukocytosis.   PRIOR THERAPY: Previous treatment with combination of Hydrea and anagrelide.  CURRENT THERAPY:  1) Revlimid 5 mg by mouth daily.  Started 03/25/2016.  Status post 12 cycles. 2) anagrelide 0.5 mg by mouth daily, discontinued September 2018 3) Aranesp 300 mcg subcutaneously every 3 weeks.  This is switched to Procrit 400 mcg subcutaneously every 2 weeks starting April 13, 2017  INTERVAL HISTORY: Roy Johnson 71 y.o. male returns to the clinic today for follow-up visit.  The patient is feeling well today except for the persistent fatigue that he has for several years now.  It is not clear if this fatigue is related to his general condition or to his treatment but for sure he has it for several years.  He does not exercise at regular basis as before.  He was seen yesterday by Dr. Evelene Croon at [facility] and she recommended for him treatment with modafinil but the patient has not started this treatment yet.  He denied having any chest pain but has shortness of breath with exertion with no cough or hemoptysis.  He denied having any fever or chills.  He has no nausea, vomiting, diarrhea or constipation.  He denied having any significant weight loss or night sweats.  He continues to tolerate his treatment with Revlimid fairly well.  He is also on Aranesp 300 mcg subcutaneously every 3 weeks but in [place] he received Procrit.  He would like to change his treatment from Aranesp to Procrit because of better copayment.  The patient is here today for evaluation and repeat blood work.   MEDICAL HISTORY: Past Medical History:  Diagnosis Date    . Anemia   . Colon polyp   . Diverticulitis   . Diverticulosis   . GERD (gastroesophageal reflux disease)   . Hyperlipidemia   . IBS (irritable bowel syndrome)   . Inguinal hernia   . Prostate cancer ([place])                . Prostate cancer ([place])   . Umbilical hernia   . Vitamin D deficiency     ALLERGIES:  has No Known Allergies.  MEDICATIONS:  Current Outpatient Medications  Medication Sig Dispense Refill  . anagrelide (AGRYLIN) 0.5 MG capsule Take 0.5 mg by mouth.    Marland Kitchen aspirin EC 81 MG tablet Take 81 mg by mouth.    . Darbepoetin Alfa (ARANESP) 100 MCG/0.5ML SOSY injection Inject into the skin.    Marland Kitchen doxycycline (VIBRA-TABS) 100 MG tablet Take 100 mg by mouth 2 (two) times daily.  0  . fluticasone (FLONASE) 50 MCG/ACT nasal spray 1 spray by Each Nare route daily.    . Multiple Vitamin (MULTI-VITAMINS) TABS Take 1 tablet by mouth daily.    . polyethylene glycol powder (GLYCOLAX/MIRALAX) powder   1   No current facility-administered medications for this visit.    Facility-Administered Medications Ordered in Other Visits  Medication Dose Route Frequency Provider Last Rate Last Dose  . 0.9 %  sodium chloride infusion  250 mL Intravenous Once Curt Bears, MD        SURGICAL HISTORY:  Past Surgical History:  Procedure Laterality Date  . COLONOSCOPY    . POLYPECTOMY    .  PROSTATECTOMY    . TONSILLECTOMY      REVIEW OF SYSTEMS:  Constitutional: positive for fatigue Eyes: negative Ears, nose, mouth, throat, and face: negative Respiratory: positive for dyspnea on exertion Cardiovascular: negative Gastrointestinal: negative Genitourinary:negative Integument/breast: negative Hematologic/lymphatic: negative Musculoskeletal:negative Neurological: negative Behavioral/Psych: negative Endocrine: negative Allergic/Immunologic: negative   PHYSICAL EXAMINATION: General appearance: alert, cooperative, appears stated age, fatigued and no distress Head: Normocephalic,  without obvious abnormality, atraumatic Neck: no adenopathy, no carotid bruit, no JVD, supple, symmetrical, trachea midline and thyroid not enlarged, symmetric, no tenderness/mass/nodules Lymph nodes: Cervical, supraclavicular, and axillary nodes normal. Resp: clear to auscultation bilaterally Back: symmetric, no curvature. ROM normal. No CVA tenderness. Cardio: regular rate and rhythm, S1, S2 normal, no murmur, click, rub or gallop GI: soft, non-tender; bowel sounds normal; no masses,  no organomegaly Extremities: extremities normal, atraumatic, no cyanosis or edema Neurologic: Alert and oriented X 3, normal strength and tone. Normal symmetric reflexes. Normal coordination and gait  ECOG PERFORMANCE STATUS: 1 - Symptomatic but completely ambulatory  Blood pressure 132/66, pulse 74, temperature 97.8 F (36.6 C), temperature source Oral, resp. rate 20, height 5\' 7"  (1.702 m), weight 161 lb 1.6 oz (73.1 kg), SpO2 100 %.  LABORATORY DATA: Lab Results  Component Value Date   WBC 5.1 03/28/2017   HGB 10.6 (L) 03/28/2017   HCT 32.2 (L) 03/28/2017   MCV 107.0 (H) 03/28/2017   PLT 447 (H) 03/28/2017      Chemistry      Component Value Date/Time   NA 141 12/08/2016 0928   K 4.5 12/08/2016 0928   CO2 27 12/08/2016 0928   BUN 14.1 12/08/2016 0928   CREATININE 0.9 12/08/2016 0928      Component Value Date/Time   CALCIUM 9.0 12/08/2016 0928   ALKPHOS 87 12/08/2016 0928   AST 25 12/08/2016 0928   ALT 41 12/08/2016 0928   BILITOT 0.84 12/08/2016 0928      RADIOGRAPHIC STUDIES:  ASSESSMENT AND PLAN:  This is a very pleasant 71 years old white male with essential thrombocythemia with positive JAK-2 mutation as well as myelodysplastic syndrome. He is currently on treatment with Revlimid 5 mg by mouth daily according to the recommendation from [facility].  He is also on treatment with Aranesp every 3 weeks.   His CBC today showed better hemoglobin and hematocrit as well as total  white blood count and platelets count.   Regarding the fatigue, I strongly encouraged the patient to exercise at regular basis. The patient will travel to [place] in January and he would come back to [place] in May 2019. I will see the patient at that time for evaluation with repeat CBC, comprehensive metabolic panel and LDH. I changed his treatment from Aranesp to Procrit 40,000 units subcutaneously every 2 weeks.. The patient was advised to call immediately if he has any concerning symptoms in the interval. All questions were answered. The patient knows to call the clinic with any problems, questions or concerns. We can certainly see the patient much sooner if necessary. I spent 20 minutes counseling the patient face to face. The total time spent in the appointment was 25 minutes.  Disclaimer: This note was dictated with voice recognition software. Similar sounding words can inadvertently be transcribed and may not be corrected upon review.

## 2017-03-28 NOTE — Telephone Encounter (Signed)
Gave avs and calendar for December and may 2019. Patient did not want to schedule every two weeks for injection because he said that he would be on vacation and did not know his schedule.

## 2017-04-07 ENCOUNTER — Other Ambulatory Visit (HOSPITAL_BASED_OUTPATIENT_CLINIC_OR_DEPARTMENT_OTHER): Payer: Medicare Other

## 2017-04-07 ENCOUNTER — Ambulatory Visit (HOSPITAL_BASED_OUTPATIENT_CLINIC_OR_DEPARTMENT_OTHER): Payer: Medicare Other

## 2017-04-07 DIAGNOSIS — D469 Myelodysplastic syndrome, unspecified: Secondary | ICD-10-CM | POA: Diagnosis not present

## 2017-04-07 DIAGNOSIS — D473 Essential (hemorrhagic) thrombocythemia: Secondary | ICD-10-CM

## 2017-04-07 LAB — CBC WITH DIFFERENTIAL/PLATELET
BASO%: 2.7 % — ABNORMAL HIGH (ref 0.0–2.0)
BASOS ABS: 0.1 10*3/uL (ref 0.0–0.1)
EOS ABS: 0.1 10*3/uL (ref 0.0–0.5)
EOS%: 3.1 % (ref 0.0–7.0)
HCT: 28.3 % — ABNORMAL LOW (ref 38.4–49.9)
HEMOGLOBIN: 9.4 g/dL — AB (ref 13.0–17.1)
LYMPH#: 1.5 10*3/uL (ref 0.9–3.3)
LYMPH%: 32.6 % (ref 14.0–49.0)
MCH: 34.8 pg — AB (ref 27.2–33.4)
MCHC: 33.2 g/dL (ref 32.0–36.0)
MCV: 104.8 fL — AB (ref 79.3–98.0)
MONO#: 0.5 10*3/uL (ref 0.1–0.9)
MONO%: 11.4 % (ref 0.0–14.0)
NEUT#: 2.3 10*3/uL (ref 1.5–6.5)
NEUT%: 50.2 % (ref 39.0–75.0)
NRBC: 1 % — AB (ref 0–0)
PLATELETS: 548 10*3/uL — AB (ref 140–400)
RBC: 2.7 10*6/uL — ABNORMAL LOW (ref 4.20–5.82)
RDW: 25 % — ABNORMAL HIGH (ref 11.0–14.6)
WBC: 4.5 10*3/uL (ref 4.0–10.3)

## 2017-04-07 LAB — TECHNOLOGIST REVIEW

## 2017-04-07 MED ORDER — EPOETIN ALFA 40000 UNIT/ML IJ SOLN
40000.0000 [IU] | Freq: Once | INTRAMUSCULAR | Status: AC
  Administered 2017-04-07: 40000 [IU] via SUBCUTANEOUS
  Filled 2017-04-07: qty 1

## 2017-04-07 NOTE — Patient Instructions (Signed)
Epoetin Alfa injection °What is this medicine? °EPOETIN ALFA (e POE e tin AL fa) helps your body make more red blood cells. This medicine is used to treat anemia caused by chronic kidney failure, cancer chemotherapy, or HIV-therapy. It may also be used before surgery if you have anemia. °This medicine may be used for other purposes; ask your health care provider or pharmacist if you have questions. °COMMON BRAND NAME(S): Epogen, Procrit °What should I tell my health care provider before I take this medicine? °They need to know if you have any of these conditions: °-blood clotting disorders °-cancer patient not on chemotherapy °-cystic fibrosis °-heart disease, such as angina or heart failure °-hemoglobin level of 12 g/dL or greater °-high blood pressure °-low levels of folate, iron, or vitamin B12 °-seizures °-an unusual or allergic reaction to erythropoietin, albumin, benzyl alcohol, hamster proteins, other medicines, foods, dyes, or preservatives °-pregnant or trying to get pregnant °-breast-feeding °How should I use this medicine? °This medicine is for injection into a vein or under the skin. It is usually given by a health care professional in a hospital or clinic setting. °If you get this medicine at home, you will be taught how to prepare and give this medicine. Use exactly as directed. Take your medicine at regular intervals. Do not take your medicine more often than directed. °It is important that you put your used needles and syringes in a special sharps container. Do not put them in a trash can. If you do not have a sharps container, call your pharmacist or healthcare provider to get one. °A special MedGuide will be given to you by the pharmacist with each prescription and refill. Be sure to read this information carefully each time. °Talk to your pediatrician regarding the use of this medicine in children. While this drug may be prescribed for selected conditions, precautions do apply. °Overdosage: If you  think you have taken too much of this medicine contact a poison control center or emergency room at once. °NOTE: This medicine is only for you. Do not share this medicine with others. °What if I miss a dose? °If you miss a dose, take it as soon as you can. If it is almost time for your next dose, take only that dose. Do not take double or extra doses. °What may interact with this medicine? °Do not take this medicine with any of the following medications: °-darbepoetin alfa °This list may not describe all possible interactions. Give your health care provider a list of all the medicines, herbs, non-prescription drugs, or dietary supplements you use. Also tell them if you smoke, drink alcohol, or use illegal drugs. Some items may interact with your medicine. °What should I watch for while using this medicine? °Your condition will be monitored carefully while you are receiving this medicine. °You may need blood work done while you are taking this medicine. °What side effects may I notice from receiving this medicine? °Side effects that you should report to your doctor or health care professional as soon as possible: °-allergic reactions like skin rash, itching or hives, swelling of the face, lips, or tongue °-breathing problems °-changes in vision °-chest pain °-confusion, trouble speaking or understanding °-feeling faint or lightheaded, falls °-high blood pressure °-muscle aches or pains °-pain, swelling, warmth in the leg °-rapid weight gain °-severe headaches °-sudden numbness or weakness of the face, arm or leg °-trouble walking, dizziness, loss of balance or coordination °-seizures (convulsions) °-swelling of the ankles, feet, hands °-unusually weak or tired °  Side effects that usually do not require medical attention (report to your doctor or health care professional if they continue or are bothersome): °-diarrhea °-fever, chills (flu-like symptoms) °-headaches °-nausea, vomiting °-redness, stinging, or swelling at  site where injected °This list may not describe all possible side effects. Call your doctor for medical advice about side effects. You may report side effects to FDA at 1-800-FDA-1088. °Where should I keep my medicine? °Keep out of the reach of children. °Store in a refrigerator between 2 and 8 degrees C (36 and 46 degrees F). Do not freeze or shake. Throw away any unused portion if using a single-dose vial. Multi-dose vials can be kept in the refrigerator for up to 21 days after the initial dose. Throw away unused medicine. °NOTE: This sheet is a summary. It may not cover all possible information. If you have questions about this medicine, talk to your doctor, pharmacist, or health care provider. °© 2018 [employer] (2015-11-16 19:42:31) ° °

## 2017-08-22 ENCOUNTER — Inpatient Hospital Stay: Payer: Medicare Other | Attending: Internal Medicine | Admitting: Internal Medicine

## 2017-08-22 ENCOUNTER — Encounter: Payer: Self-pay | Admitting: Internal Medicine

## 2017-08-22 ENCOUNTER — Inpatient Hospital Stay: Payer: Medicare Other

## 2017-08-22 ENCOUNTER — Other Ambulatory Visit: Payer: Self-pay | Admitting: Medical Oncology

## 2017-08-22 ENCOUNTER — Telehealth: Payer: Self-pay | Admitting: Internal Medicine

## 2017-08-22 VITALS — BP 155/85 | HR 73 | Temp 98.0°F | Resp 18 | Ht 67.0 in | Wt 166.8 lb

## 2017-08-22 DIAGNOSIS — D72829 Elevated white blood cell count, unspecified: Secondary | ICD-10-CM | POA: Diagnosis not present

## 2017-08-22 DIAGNOSIS — D469 Myelodysplastic syndrome, unspecified: Secondary | ICD-10-CM

## 2017-08-22 DIAGNOSIS — D75839 Thrombocytosis, unspecified: Secondary | ICD-10-CM

## 2017-08-22 DIAGNOSIS — R5383 Other fatigue: Secondary | ICD-10-CM | POA: Diagnosis not present

## 2017-08-22 DIAGNOSIS — R410 Disorientation, unspecified: Secondary | ICD-10-CM | POA: Diagnosis not present

## 2017-08-22 DIAGNOSIS — D473 Essential (hemorrhagic) thrombocythemia: Secondary | ICD-10-CM | POA: Diagnosis not present

## 2017-08-22 DIAGNOSIS — R109 Unspecified abdominal pain: Secondary | ICD-10-CM | POA: Diagnosis not present

## 2017-08-22 DIAGNOSIS — Z7982 Long term (current) use of aspirin: Secondary | ICD-10-CM | POA: Insufficient documentation

## 2017-08-22 DIAGNOSIS — R531 Weakness: Secondary | ICD-10-CM | POA: Diagnosis not present

## 2017-08-22 LAB — CBC WITH DIFFERENTIAL/PLATELET
BASOS ABS: 0.1 10*3/uL (ref 0.0–0.1)
Basophils Relative: 3 %
EOS PCT: 4 %
Eosinophils Absolute: 0.2 10*3/uL (ref 0.0–0.5)
HEMATOCRIT: 30.7 % — AB (ref 38.4–49.9)
Hemoglobin: 10.1 g/dL — ABNORMAL LOW (ref 13.0–17.1)
LYMPHS ABS: 1.3 10*3/uL (ref 0.9–3.3)
LYMPHS PCT: 27 %
MCH: 32.4 pg (ref 27.2–33.4)
MCHC: 32.9 g/dL (ref 32.0–36.0)
MCV: 98.4 fL — AB (ref 79.3–98.0)
MONO ABS: 0.5 10*3/uL (ref 0.1–0.9)
MONOS PCT: 11 %
NEUTROS ABS: 2.7 10*3/uL (ref 1.5–6.5)
Neutrophils Relative %: 55 %
Platelets: 544 10*3/uL — ABNORMAL HIGH (ref 140–400)
RBC: 3.12 MIL/uL — AB (ref 4.20–5.82)
RDW: 26.7 % — ABNORMAL HIGH (ref 11.0–14.6)
WBC: 4.9 10*3/uL (ref 4.0–10.3)

## 2017-08-22 LAB — COMPREHENSIVE METABOLIC PANEL
ALT: 37 U/L (ref 0–55)
AST: 23 U/L (ref 5–34)
Albumin: 3.8 g/dL (ref 3.5–5.0)
Alkaline Phosphatase: 53 U/L (ref 40–150)
Anion gap: 5 (ref 3–11)
BILIRUBIN TOTAL: 0.7 mg/dL (ref 0.2–1.2)
BUN: 15 mg/dL (ref 7–26)
CHLORIDE: 111 mmol/L — AB (ref 98–109)
CO2: 24 mmol/L (ref 22–29)
CREATININE: 0.98 mg/dL (ref 0.70–1.30)
Calcium: 9 mg/dL (ref 8.4–10.4)
GFR calc Af Amer: >60 mL/min (ref >60)
GLUCOSE: 111 mg/dL (ref 70–140)
Potassium: 4.3 mmol/L (ref 3.5–5.1)
Sodium: 140 mmol/L (ref 136–145)
TOTAL PROTEIN: 6.6 g/dL (ref 6.4–8.3)

## 2017-08-22 LAB — LACTATE DEHYDROGENASE: LDH: 205 U/L (ref 125–245)

## 2017-08-22 MED ORDER — EPOETIN ALFA 40000 UNIT/ML IJ SOLN
40000.0000 [IU] | Freq: Once | INTRAMUSCULAR | Status: AC
  Administered 2017-08-22: 40000 [IU] via SUBCUTANEOUS

## 2017-08-22 MED ORDER — EPOETIN ALFA 40000 UNIT/ML IJ SOLN
INTRAMUSCULAR | Status: AC
  Filled 2017-08-22: qty 1

## 2017-08-22 NOTE — Telephone Encounter (Signed)
Scheduled appt per 5/14 los - pt is aware of appt date and time. - my chart active.

## 2017-08-22 NOTE — Progress Notes (Signed)
[place]  [phone] [phone]) [phone]  OFFICE PROGRESS NOTE  [name], MD [address] [place] [address]  DIAGNOSIS: 1.  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 Essential thrombocythemia with positive JAK2 mutation 3. Leukocytosis.   PRIOR THERAPY: Previous treatment with combination of Hydrea and anagrelide.  CURRENT THERAPY:  1) Revlimid 5 mg by mouth daily.  Started 03/25/2016.  Status post 12 cycles. 2) anagrelide 0.5 mg by mouth daily, discontinued September 2018 3) Aranesp 300 mcg subcutaneously every 3 weeks.  This is switched to Procrit 400 mcg subcutaneously every 2 weeks starting April 13, 2017.  The frequency of the Procrit was changed to weekly schedule when he was in [place].  INTERVAL HISTORY: Roy Johnson 72 y.o. male return to the clinic today for follow-up visit.  The patient continues to complain of increasing fatigue and weakness as well as state of confusion and vague mental status at times.  He also has intermittent abdominal pain.  He was found during the visit to his hematologist in [place] to have cholelithiasis.  He was planning to order ultrasound of the abdomen for further evaluation of his abdominal pain but the patient moved to [place] before this procedure was done.  He has no significant bleeding, bruises or ecchymosis.  He denied having any current chest pain, shortness of breath, cough or hemoptysis.  He has good and bad days.  He mentioned that today was one of his bad days.  He denied having any current fever or chills.  He has no nausea, vomiting, diarrhea or constipation.  He denied having any headache or visual changes.  He is here today for evaluation and repeat blood work.   MEDICAL HISTORY: Past Medical History:  Diagnosis Date  . Anemia   . Colon polyp   . Diverticulitis   . Diverticulosis   . GERD  (gastroesophageal reflux disease)   . Hyperlipidemia   . IBS (irritable bowel syndrome)   . Inguinal hernia   . Prostate cancer ([place])                . Prostate cancer ([place])   . Umbilical hernia   . Vitamin D deficiency     ALLERGIES:  has No Known Allergies.  MEDICATIONS:  Current Outpatient Medications  Medication Sig Dispense Refill  . aspirin EC 81 MG tablet Take 81 mg by mouth.    . Darbepoetin Alfa (ARANESP) 100 MCG/0.5ML SOSY injection Inject into the skin.    Marland Kitchen lenalidomide (REVLIMID) 5 MG capsule Take 5 mg by mouth daily.    . Multiple Vitamin (MULTI-VITAMINS) TABS Take 1 tablet by mouth daily.    . polyethylene glycol powder (GLYCOLAX/MIRALAX) powder   1  . predniSONE (DELTASONE) 10 MG tablet Take 10 mg by mouth 2 (two) times a week.  1  . anagrelide (AGRYLIN) 0.5 MG capsule Take 0.5 mg by mouth.    . vitamin B-12 (CYANOCOBALAMIN) 1000 MCG tablet Take 1 tablet by mouth daily as needed.     No current facility-administered medications for this visit.    Facility-Administered Medications Ordered in Other Visits  Medication Dose Route Frequency Provider Last Rate Last Dose  . 0.9 %  sodium chloride infusion  250 mL Intravenous Once Curt Bears, MD        SURGICAL HISTORY:  Past Surgical History:  Procedure Laterality Date  . COLONOSCOPY    . POLYPECTOMY    . PROSTATECTOMY    .  TONSILLECTOMY      REVIEW OF SYSTEMS:  Constitutional: positive for fatigue Eyes: negative Ears, nose, mouth, throat, and face: negative Respiratory: negative Cardiovascular: negative Gastrointestinal: positive for abdominal pain Genitourinary:negative Integument/breast: negative Hematologic/lymphatic: negative Musculoskeletal:positive for arthralgias Neurological: positive for paresthesia and Vague mental status Behavioral/Psych: negative Endocrine: negative Allergic/Immunologic: negative   PHYSICAL EXAMINATION: General appearance: alert, cooperative, appears stated age,  fatigued and no distress Head: Normocephalic, without obvious abnormality, atraumatic Neck: no adenopathy, no carotid bruit, no JVD, supple, symmetrical, trachea midline and thyroid not enlarged, symmetric, no tenderness/mass/nodules Lymph nodes: Cervical, supraclavicular, and axillary nodes normal. Resp: clear to auscultation bilaterally Back: symmetric, no curvature. ROM normal. No CVA tenderness. Cardio: regular rate and rhythm, S1, S2 normal, no murmur, click, rub or gallop GI: soft, non-tender; bowel sounds normal; no masses,  no organomegaly Extremities: extremities normal, atraumatic, no cyanosis or edema Neurologic: Alert and oriented X 3, normal strength and tone. Normal symmetric reflexes. Normal coordination and gait  ECOG PERFORMANCE STATUS: 1 - Symptomatic but completely ambulatory  Blood pressure (!) 155/85, pulse 73, temperature 98 F (36.7 C), temperature source Oral, resp. rate 18, height 5\' 7"  (1.702 m), weight 166 lb 12.8 oz (75.7 kg), SpO2 100 %.  LABORATORY DATA: Lab Results  Component Value Date   WBC 4.9 08/22/2017   HGB 10.1 (L) 08/22/2017   HCT 30.7 (L) 08/22/2017   MCV 98.4 (H) 08/22/2017   PLT 544 (H) 08/22/2017      Chemistry      Component Value Date/Time   NA 140 08/22/2017 0959   NA 141 12/08/2016 0928   K 4.3 08/22/2017 0959   K 4.5 12/08/2016 0928   CL 111 (H) 08/22/2017 0959   CO2 24 08/22/2017 0959   CO2 27 12/08/2016 0928   BUN 15 08/22/2017 0959   BUN 14.1 12/08/2016 0928   CREATININE 0.98 08/22/2017 0959   CREATININE 0.9 12/08/2016 0928      Component Value Date/Time   CALCIUM 9.0 08/22/2017 0959   CALCIUM 9.0 12/08/2016 0928   ALKPHOS 53 08/22/2017 0959   ALKPHOS 87 12/08/2016 0928   AST 23 08/22/2017 0959   AST 25 12/08/2016 0928   ALT 37 08/22/2017 0959   ALT 41 12/08/2016 0928   BILITOT 0.7 08/22/2017 0959   BILITOT 0.84 12/08/2016 0928      RADIOGRAPHIC STUDIES:  ASSESSMENT AND PLAN:  This is a very pleasant 72 years  old white male with essential thrombocythemia with positive JAK-2 mutation as well as myelodysplastic syndrome. He is currently on treatment with Revlimid 5 mg by mouth daily according to the recommendation from [facility].  He is also on treatment with Aranesp every 3 weeks.  Aranesp was a change it to Procrit initially every 2 weeks and currently on weekly basis.  He mentioned that he is feeling much better on Procrit weekly and his hemoglobin has always been above 10.0 g/dL.  He would like to continue on the weekly Procrit doses.  I will arrange for the patient care plan for this new regimen and hopefully this will be approved by his insurance. He will continue his current treatment with Revlimid. For the abdominal pain he is seeing his primary care physician in the next few days and he will discuss with him consideration of CT scan of the abdomen. For the vague mental status, I recommended for the patient to see neurology for evaluation but he would like to wait until he sees his primary care physician. The  patient has a lot of questions and they gave him the time to ask his question and answer them to the best of my knowledge today. He will come back for follow-up visit in 1 months for reevaluation and repeat blood work. He was advised to call immediately if he has any concerning symptoms in the interval. All questions were answered. The patient knows to call the clinic with any problems, questions or concerns. We can certainly see the patient much sooner if necessary. I spent 20 minutes counseling the patient face to face. The total time spent in the appointment was 30 minutes.  Disclaimer: This note was dictated with voice recognition software. Similar sounding words can inadvertently be transcribed and may not be corrected upon review.

## 2017-08-29 ENCOUNTER — Inpatient Hospital Stay: Payer: Medicare Other

## 2017-08-29 ENCOUNTER — Ambulatory Visit: Payer: Medicare Other

## 2017-08-29 DIAGNOSIS — D469 Myelodysplastic syndrome, unspecified: Secondary | ICD-10-CM

## 2017-08-29 LAB — CBC WITH DIFFERENTIAL/PLATELET
Basophils Absolute: 0.2 10*3/uL — ABNORMAL HIGH (ref 0.0–0.1)
Basophils Relative: 3 %
Eosinophils Absolute: 0.5 10*3/uL (ref 0.0–0.5)
Eosinophils Relative: 9 %
HEMATOCRIT: 34.2 % — AB (ref 38.4–49.9)
HEMOGLOBIN: 11.1 g/dL — AB (ref 13.0–17.1)
LYMPHS PCT: 25 %
Lymphs Abs: 1.4 10*3/uL (ref 0.9–3.3)
MCH: 34 pg — ABNORMAL HIGH (ref 27.2–33.4)
MCHC: 32.5 g/dL (ref 32.0–36.0)
MCV: 104.5 fL — AB (ref 79.3–98.0)
MONO ABS: 0.6 10*3/uL (ref 0.1–0.9)
MONOS PCT: 11 %
NEUTROS ABS: 2.8 10*3/uL (ref 1.5–6.5)
NEUTROS PCT: 52 %
Platelets: 527 10*3/uL — ABNORMAL HIGH (ref 140–400)
RBC: 3.28 MIL/uL — ABNORMAL LOW (ref 4.20–5.82)
RDW: 27.4 % — AB (ref 11.0–14.6)
WBC: 5.4 10*3/uL (ref 4.0–10.3)

## 2017-08-31 ENCOUNTER — Other Ambulatory Visit: Payer: Self-pay | Admitting: Internal Medicine

## 2017-08-31 DIAGNOSIS — R109 Unspecified abdominal pain: Secondary | ICD-10-CM

## 2017-08-31 DIAGNOSIS — R410 Disorientation, unspecified: Secondary | ICD-10-CM

## 2017-08-31 DIAGNOSIS — K8080 Other cholelithiasis without obstruction: Secondary | ICD-10-CM

## 2017-08-31 DIAGNOSIS — K5909 Other constipation: Secondary | ICD-10-CM

## 2017-08-31 DIAGNOSIS — D469 Myelodysplastic syndrome, unspecified: Secondary | ICD-10-CM

## 2017-09-05 ENCOUNTER — Inpatient Hospital Stay: Payer: Medicare Other

## 2017-09-05 VITALS — BP 150/83 | HR 71 | Temp 98.2°F | Resp 20

## 2017-09-05 DIAGNOSIS — D469 Myelodysplastic syndrome, unspecified: Secondary | ICD-10-CM

## 2017-09-05 LAB — CBC WITH DIFFERENTIAL/PLATELET
Basophils Absolute: 0.1 10*3/uL (ref 0.0–0.1)
Basophils Relative: 1 %
Eosinophils Absolute: 0.5 10*3/uL (ref 0.0–0.5)
Eosinophils Relative: 9 %
HCT: 32.7 % — ABNORMAL LOW (ref 38.4–49.9)
Hemoglobin: 10.5 g/dL — ABNORMAL LOW (ref 13.0–17.1)
Lymphocytes Relative: 27 %
Lymphs Abs: 1.5 10*3/uL (ref 0.9–3.3)
MCH: 32.3 pg (ref 27.2–33.4)
MCHC: 32.1 g/dL (ref 32.0–36.0)
MCV: 100.6 fL — ABNORMAL HIGH (ref 79.3–98.0)
Monocytes Absolute: 0.4 10*3/uL (ref 0.1–0.9)
Monocytes Relative: 8 %
Neutro Abs: 2.9 10*3/uL (ref 1.5–6.5)
Neutrophils Relative %: 55 %
Platelets: 614 10*3/uL — ABNORMAL HIGH (ref 140–400)
RBC: 3.25 MIL/uL — ABNORMAL LOW (ref 4.20–5.82)
RDW: 27.4 % — ABNORMAL HIGH (ref 11.0–14.6)
WBC: 5.3 10*3/uL (ref 4.0–10.3)

## 2017-09-05 MED ORDER — EPOETIN ALFA 40000 UNIT/ML IJ SOLN
INTRAMUSCULAR | Status: AC
  Filled 2017-09-05: qty 1

## 2017-09-05 MED ORDER — EPOETIN ALFA 40000 UNIT/ML IJ SOLN
40000.0000 [IU] | Freq: Once | INTRAMUSCULAR | Status: AC
  Administered 2017-09-05: 40000 [IU] via SUBCUTANEOUS

## 2017-09-05 NOTE — Patient Instructions (Signed)
Epoetin Alfa injection °What is this medicine? °EPOETIN ALFA (e POE e tin AL fa) helps your body make more red blood cells. This medicine is used to treat anemia caused by chronic kidney failure, cancer chemotherapy, or HIV-therapy. It may also be used before surgery if you have anemia. °This medicine may be used for other purposes; ask your health care provider or pharmacist if you have questions. °COMMON BRAND NAME(S): Epogen, Procrit °What should I tell my health care provider before I take this medicine? °They need to know if you have any of these conditions: °-blood clotting disorders °-cancer patient not on chemotherapy °-cystic fibrosis °-heart disease, such as angina or heart failure °-hemoglobin level of 12 g/dL or greater °-high blood pressure °-low levels of folate, iron, or vitamin B12 °-seizures °-an unusual or allergic reaction to erythropoietin, albumin, benzyl alcohol, hamster proteins, other medicines, foods, dyes, or preservatives °-pregnant or trying to get pregnant °-breast-feeding °How should I use this medicine? °This medicine is for injection into a vein or under the skin. It is usually given by a health care professional in a hospital or clinic setting. °If you get this medicine at home, you will be taught how to prepare and give this medicine. Use exactly as directed. Take your medicine at regular intervals. Do not take your medicine more often than directed. °It is important that you put your used needles and syringes in a special sharps container. Do not put them in a trash can. If you do not have a sharps container, call your pharmacist or healthcare provider to get one. °A special MedGuide will be given to you by the pharmacist with each prescription and refill. Be sure to read this information carefully each time. °Talk to your pediatrician regarding the use of this medicine in children. While this drug may be prescribed for selected conditions, precautions do apply. °Overdosage: If you  think you have taken too much of this medicine contact a poison control center or emergency room at once. °NOTE: This medicine is only for you. Do not share this medicine with others. °What if I miss a dose? °If you miss a dose, take it as soon as you can. If it is almost time for your next dose, take only that dose. Do not take double or extra doses. °What may interact with this medicine? °Do not take this medicine with any of the following medications: °-darbepoetin alfa °This list may not describe all possible interactions. Give your health care provider a list of all the medicines, herbs, non-prescription drugs, or dietary supplements you use. Also tell them if you smoke, drink alcohol, or use illegal drugs. Some items may interact with your medicine. °What should I watch for while using this medicine? °Your condition will be monitored carefully while you are receiving this medicine. °You may need blood work done while you are taking this medicine. °What side effects may I notice from receiving this medicine? °Side effects that you should report to your doctor or health care professional as soon as possible: °-allergic reactions like skin rash, itching or hives, swelling of the face, lips, or tongue °-breathing problems °-changes in vision °-chest pain °-confusion, trouble speaking or understanding °-feeling faint or lightheaded, falls °-high blood pressure °-muscle aches or pains °-pain, swelling, warmth in the leg °-rapid weight gain °-severe headaches °-sudden numbness or weakness of the face, arm or leg °-trouble walking, dizziness, loss of balance or coordination °-seizures (convulsions) °-swelling of the ankles, feet, hands °-unusually weak or tired °  Side effects that usually do not require medical attention (report to your doctor or health care professional if they continue or are bothersome): °-diarrhea °-fever, chills (flu-like symptoms) °-headaches °-nausea, vomiting °-redness, stinging, or swelling at  site where injected °This list may not describe all possible side effects. Call your doctor for medical advice about side effects. You may report side effects to FDA at 1-800-FDA-1088. °Where should I keep my medicine? °Keep out of the reach of children. °Store in a refrigerator between 2 and 8 degrees C (36 and 46 degrees F). Do not freeze or shake. Throw away any unused portion if using a single-dose vial. Multi-dose vials can be kept in the refrigerator for up to 21 days after the initial dose. Throw away unused medicine. °NOTE: This sheet is a summary. It may not cover all possible information. If you have questions about this medicine, talk to your doctor, pharmacist, or health care provider. °© 2018 [employer] (2015-11-16 19:42:31) ° °

## 2017-09-12 ENCOUNTER — Inpatient Hospital Stay: Payer: Medicare Other

## 2017-09-12 ENCOUNTER — Telehealth: Payer: Self-pay | Admitting: *Deleted

## 2017-09-12 ENCOUNTER — Inpatient Hospital Stay: Payer: Medicare Other | Attending: Internal Medicine

## 2017-09-12 VITALS — BP 152/82 | HR 72 | Temp 98.2°F | Resp 18

## 2017-09-12 DIAGNOSIS — D473 Essential (hemorrhagic) thrombocythemia: Secondary | ICD-10-CM | POA: Diagnosis present

## 2017-09-12 DIAGNOSIS — D469 Myelodysplastic syndrome, unspecified: Secondary | ICD-10-CM | POA: Insufficient documentation

## 2017-09-12 DIAGNOSIS — R5382 Chronic fatigue, unspecified: Secondary | ICD-10-CM | POA: Insufficient documentation

## 2017-09-12 LAB — CBC WITH DIFFERENTIAL/PLATELET
Basophils Absolute: 0.1 10*3/uL (ref 0.0–0.1)
Basophils Relative: 1 %
EOS PCT: 10 %
Eosinophils Absolute: 0.7 10*3/uL — ABNORMAL HIGH (ref 0.0–0.5)
HCT: 32.9 % — ABNORMAL LOW (ref 38.4–49.9)
Hemoglobin: 10.7 g/dL — ABNORMAL LOW (ref 13.0–17.1)
LYMPHS ABS: 1.7 10*3/uL (ref 0.9–3.3)
LYMPHS PCT: 24 %
MCH: 32.5 pg (ref 27.2–33.4)
MCHC: 32.5 g/dL (ref 32.0–36.0)
MCV: 100 fL — AB (ref 79.3–98.0)
MONO ABS: 1 10*3/uL — AB (ref 0.1–0.9)
MONOS PCT: 15 %
Neutro Abs: 3.5 10*3/uL (ref 1.5–6.5)
Neutrophils Relative %: 50 %
PLATELETS: 740 10*3/uL — AB (ref 140–400)
RBC: 3.29 MIL/uL — ABNORMAL LOW (ref 4.20–5.82)
RDW: 27.5 % — AB (ref 11.0–14.6)
WBC: 7 10*3/uL (ref 4.0–10.3)

## 2017-09-12 MED ORDER — EPOETIN ALFA 40000 UNIT/ML IJ SOLN
40000.0000 [IU] | Freq: Once | INTRAMUSCULAR | Status: AC
  Administered 2017-09-12: 40000 [IU] via SUBCUTANEOUS

## 2017-09-12 MED ORDER — EPOETIN ALFA 40000 UNIT/ML IJ SOLN
INTRAMUSCULAR | Status: AC
  Filled 2017-09-12: qty 1

## 2017-09-12 NOTE — Telephone Encounter (Signed)
Pt has called [facility], today and awaiting for the [facility] Navigator to call back. Pt has appt on Friday morning for MRI and CT scan.   Pt has agrelite at the house and will call back within 24 hours as he would like to wait to hear back from [facility] regarding further instructions about his medications. Reviewed above information with MD

## 2017-09-12 NOTE — Patient Instructions (Signed)
Epoetin Alfa injection °What is this medicine? °EPOETIN ALFA (e POE e tin AL fa) helps your body make more red blood cells. This medicine is used to treat anemia caused by chronic kidney failure, cancer chemotherapy, or HIV-therapy. It may also be used before surgery if you have anemia. °This medicine may be used for other purposes; ask your health care provider or pharmacist if you have questions. °COMMON BRAND NAME(S): Epogen, Procrit °What should I tell my health care provider before I take this medicine? °They need to know if you have any of these conditions: °-blood clotting disorders °-cancer patient not on chemotherapy °-cystic fibrosis °-heart disease, such as angina or heart failure °-hemoglobin level of 12 g/dL or greater °-high blood pressure °-low levels of folate, iron, or vitamin B12 °-seizures °-an unusual or allergic reaction to erythropoietin, albumin, benzyl alcohol, hamster proteins, other medicines, foods, dyes, or preservatives °-pregnant or trying to get pregnant °-breast-feeding °How should I use this medicine? °This medicine is for injection into a vein or under the skin. It is usually given by a health care professional in a hospital or clinic setting. °If you get this medicine at home, you will be taught how to prepare and give this medicine. Use exactly as directed. Take your medicine at regular intervals. Do not take your medicine more often than directed. °It is important that you put your used needles and syringes in a special sharps container. Do not put them in a trash can. If you do not have a sharps container, call your pharmacist or healthcare provider to get one. °A special MedGuide will be given to you by the pharmacist with each prescription and refill. Be sure to read this information carefully each time. °Talk to your pediatrician regarding the use of this medicine in children. While this drug may be prescribed for selected conditions, precautions do apply. °Overdosage: If you  think you have taken too much of this medicine contact a poison control center or emergency room at once. °NOTE: This medicine is only for you. Do not share this medicine with others. °What if I miss a dose? °If you miss a dose, take it as soon as you can. If it is almost time for your next dose, take only that dose. Do not take double or extra doses. °What may interact with this medicine? °Do not take this medicine with any of the following medications: °-darbepoetin alfa °This list may not describe all possible interactions. Give your health care provider a list of all the medicines, herbs, non-prescription drugs, or dietary supplements you use. Also tell them if you smoke, drink alcohol, or use illegal drugs. Some items may interact with your medicine. °What should I watch for while using this medicine? °Your condition will be monitored carefully while you are receiving this medicine. °You may need blood work done while you are taking this medicine. °What side effects may I notice from receiving this medicine? °Side effects that you should report to your doctor or health care professional as soon as possible: °-allergic reactions like skin rash, itching or hives, swelling of the face, lips, or tongue °-breathing problems °-changes in vision °-chest pain °-confusion, trouble speaking or understanding °-feeling faint or lightheaded, falls °-high blood pressure °-muscle aches or pains °-pain, swelling, warmth in the leg °-rapid weight gain °-severe headaches °-sudden numbness or weakness of the face, arm or leg °-trouble walking, dizziness, loss of balance or coordination °-seizures (convulsions) °-swelling of the ankles, feet, hands °-unusually weak or tired °  Side effects that usually do not require medical attention (report to your doctor or health care professional if they continue or are bothersome): °-diarrhea °-fever, chills (flu-like symptoms) °-headaches °-nausea, vomiting °-redness, stinging, or swelling at  site where injected °This list may not describe all possible side effects. Call your doctor for medical advice about side effects. You may report side effects to FDA at 1-800-FDA-1088. °Where should I keep my medicine? °Keep out of the reach of children. °Store in a refrigerator between 2 and 8 degrees C (36 and 46 degrees F). Do not freeze or shake. Throw away any unused portion if using a single-dose vial. Multi-dose vials can be kept in the refrigerator for up to 21 days after the initial dose. Throw away unused medicine. °NOTE: This sheet is a summary. It may not cover all possible information. If you have questions about this medicine, talk to your doctor, pharmacist, or health care provider. °© 2018 [employer] (2015-11-16 19:42:31) ° °

## 2017-09-12 NOTE — Telephone Encounter (Signed)
-----   Message from Curt Bears, MD sent at 09/12/2017  5:09 PM EDT ----- His platelets are higher than what we like to see.  If he is not seeing his hematologist at [facility] before me, I may need to see him sooner like Friday morning to discuss other treatment options.  Thank you ----- Message ----- From: Interface, Lab In [place] Sent: 09/12/2017   9:31 AM To: Curt Bears, MD

## 2017-09-15 ENCOUNTER — Ambulatory Visit
Admission: RE | Admit: 2017-09-15 | Discharge: 2017-09-15 | Disposition: A | Discharge status: Home / Self Care | Payer: Medicare Other | Source: Ambulatory Visit | Attending: Internal Medicine | Admitting: Internal Medicine

## 2017-09-15 ENCOUNTER — Other Ambulatory Visit: Payer: Self-pay | Admitting: Internal Medicine

## 2017-09-15 DIAGNOSIS — K5909 Other constipation: Secondary | ICD-10-CM

## 2017-09-15 DIAGNOSIS — R109 Unspecified abdominal pain: Secondary | ICD-10-CM

## 2017-09-15 DIAGNOSIS — K8080 Other cholelithiasis without obstruction: Secondary | ICD-10-CM

## 2017-09-15 DIAGNOSIS — D469 Myelodysplastic syndrome, unspecified: Secondary | ICD-10-CM

## 2017-09-15 DIAGNOSIS — R410 Disorientation, unspecified: Secondary | ICD-10-CM

## 2017-09-15 MED ORDER — IOPAMIDOL (ISOVUE-300) INJECTION 61%
100.0000 mL | Freq: Once | INTRAVENOUS | Status: AC | PRN
  Administered 2017-09-15: 100 mL via INTRAVENOUS

## 2017-09-19 ENCOUNTER — Inpatient Hospital Stay: Payer: Medicare Other

## 2017-09-19 ENCOUNTER — Inpatient Hospital Stay: Payer: Medicare Other | Admitting: Internal Medicine

## 2017-09-19 ENCOUNTER — Telehealth: Payer: Self-pay | Admitting: Internal Medicine

## 2017-09-19 ENCOUNTER — Encounter: Payer: Self-pay | Admitting: Internal Medicine

## 2017-09-19 VITALS — BP 143/80 | HR 68 | Temp 98.0°F | Resp 18 | Ht 67.0 in | Wt 165.0 lb

## 2017-09-19 VITALS — BP 142/78 | HR 72 | Temp 98.7°F | Resp 17

## 2017-09-19 DIAGNOSIS — D473 Essential (hemorrhagic) thrombocythemia: Secondary | ICD-10-CM

## 2017-09-19 DIAGNOSIS — D469 Myelodysplastic syndrome, unspecified: Secondary | ICD-10-CM | POA: Diagnosis not present

## 2017-09-19 DIAGNOSIS — R5382 Chronic fatigue, unspecified: Secondary | ICD-10-CM | POA: Diagnosis not present

## 2017-09-19 LAB — CBC WITH DIFFERENTIAL/PLATELET
BASOS ABS: 0.1 10*3/uL (ref 0.0–0.1)
BASOS PCT: 2 %
Eosinophils Absolute: 0.5 10*3/uL (ref 0.0–0.5)
Eosinophils Relative: 11 %
HCT: 32.4 % — ABNORMAL LOW (ref 38.4–49.9)
HEMOGLOBIN: 10.5 g/dL — AB (ref 13.0–17.1)
LYMPHS PCT: 30 %
Lymphs Abs: 1.5 10*3/uL (ref 0.9–3.3)
MCH: 32.3 pg (ref 27.2–33.4)
MCHC: 32.4 g/dL (ref 32.0–36.0)
MCV: 99.7 fL — AB (ref 79.3–98.0)
MONO ABS: 0.6 10*3/uL (ref 0.1–0.9)
Monocytes Relative: 13 %
NEUTROS ABS: 2.2 10*3/uL (ref 1.5–6.5)
NEUTROS PCT: 44 %
Platelets: 537 10*3/uL — ABNORMAL HIGH (ref 140–400)
RBC: 3.25 MIL/uL — AB (ref 4.20–5.82)
RDW: 27.2 % — ABNORMAL HIGH (ref 11.0–14.6)
WBC: 5 10*3/uL (ref 4.0–10.3)

## 2017-09-19 MED ORDER — EPOETIN ALFA 40000 UNIT/ML IJ SOLN
40000.0000 [IU] | Freq: Once | INTRAMUSCULAR | Status: AC
  Administered 2017-09-19: 40000 [IU] via SUBCUTANEOUS

## 2017-09-19 NOTE — Patient Instructions (Signed)
Epoetin Alfa injection °What is this medicine? °EPOETIN ALFA (e POE e tin AL fa) helps your body make more red blood cells. This medicine is used to treat anemia caused by chronic kidney failure, cancer chemotherapy, or HIV-therapy. It may also be used before surgery if you have anemia. °This medicine may be used for other purposes; ask your health care provider or pharmacist if you have questions. °COMMON BRAND NAME(S): Epogen, Procrit °What should I tell my health care provider before I take this medicine? °They need to know if you have any of these conditions: °-blood clotting disorders °-cancer patient not on chemotherapy °-cystic fibrosis °-heart disease, such as angina or heart failure °-hemoglobin level of 12 g/dL or greater °-high blood pressure °-low levels of folate, iron, or vitamin B12 °-seizures °-an unusual or allergic reaction to erythropoietin, albumin, benzyl alcohol, hamster proteins, other medicines, foods, dyes, or preservatives °-pregnant or trying to get pregnant °-breast-feeding °How should I use this medicine? °This medicine is for injection into a vein or under the skin. It is usually given by a health care professional in a hospital or clinic setting. °If you get this medicine at home, you will be taught how to prepare and give this medicine. Use exactly as directed. Take your medicine at regular intervals. Do not take your medicine more often than directed. °It is important that you put your used needles and syringes in a special sharps container. Do not put them in a trash can. If you do not have a sharps container, call your pharmacist or healthcare provider to get one. °A special MedGuide will be given to you by the pharmacist with each prescription and refill. Be sure to read this information carefully each time. °Talk to your pediatrician regarding the use of this medicine in children. While this drug may be prescribed for selected conditions, precautions do apply. °Overdosage: If you  think you have taken too much of this medicine contact a poison control center or emergency room at once. °NOTE: This medicine is only for you. Do not share this medicine with others. °What if I miss a dose? °If you miss a dose, take it as soon as you can. If it is almost time for your next dose, take only that dose. Do not take double or extra doses. °What may interact with this medicine? °Do not take this medicine with any of the following medications: °-darbepoetin alfa °This list may not describe all possible interactions. Give your health care provider a list of all the medicines, herbs, non-prescription drugs, or dietary supplements you use. Also tell them if you smoke, drink alcohol, or use illegal drugs. Some items may interact with your medicine. °What should I watch for while using this medicine? °Your condition will be monitored carefully while you are receiving this medicine. °You may need blood work done while you are taking this medicine. °What side effects may I notice from receiving this medicine? °Side effects that you should report to your doctor or health care professional as soon as possible: °-allergic reactions like skin rash, itching or hives, swelling of the face, lips, or tongue °-breathing problems °-changes in vision °-chest pain °-confusion, trouble speaking or understanding °-feeling faint or lightheaded, falls °-high blood pressure °-muscle aches or pains °-pain, swelling, warmth in the leg °-rapid weight gain °-severe headaches °-sudden numbness or weakness of the face, arm or leg °-trouble walking, dizziness, loss of balance or coordination °-seizures (convulsions) °-swelling of the ankles, feet, hands °-unusually weak or tired °  Side effects that usually do not require medical attention (report to your doctor or health care professional if they continue or are bothersome): °-diarrhea °-fever, chills (flu-like symptoms) °-headaches °-nausea, vomiting °-redness, stinging, or swelling at  site where injected °This list may not describe all possible side effects. Call your doctor for medical advice about side effects. You may report side effects to FDA at 1-800-FDA-1088. °Where should I keep my medicine? °Keep out of the reach of children. °Store in a refrigerator between 2 and 8 degrees C (36 and 46 degrees F). Do not freeze or shake. Throw away any unused portion if using a single-dose vial. Multi-dose vials can be kept in the refrigerator for up to 21 days after the initial dose. Throw away unused medicine. °NOTE: This sheet is a summary. It may not cover all possible information. If you have questions about this medicine, talk to your doctor, pharmacist, or health care provider. °© 2018 [employer] (2015-11-16 19:42:31) ° °

## 2017-09-19 NOTE — Progress Notes (Signed)
[place]  Telephone:([phone]) [phone] Fax:(336) [phone]  OFFICE PROGRESS NOTE  [name], MD [address] [place] [address]  DIAGNOSIS: 1.  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 Essential thrombocythemia with positive JAK2 mutation 3. Leukocytosis.   PRIOR THERAPY: Previous treatment with combination of Hydrea and anagrelide.  CURRENT THERAPY:  1) Revlimid 5 mg by mouth daily.  Started 03/25/2016.  Status post 13 cycles. 2) anagrelide 0.5 mg by mouth daily 3) Aranesp 300 mcg subcutaneously every 3 weeks.  This is switched to Procrit 400 mcg subcutaneously every 2 weeks starting April 13, 2017.  The frequency of the Procrit was changed to weekly schedule when he was in [place].  INTERVAL HISTORY: Roy Johnson 72 y.o. male returns to the clinic today for follow-up visit.  The patient continues to complain of fatigue and foggy head.  He had MRI of the brain that was unremarkable.  He also has CT of the abdomen that showed no concerning abnormalities.  He denied having any chest pain, shortness breath, cough or hemoptysis.  He was able to mow his yard yesterday.  He denied having any fever or chills.  He has no nausea, vomiting, diarrhea or constipation.  He is currently on Procrit 400 mcg subcutaneously on a weekly basis.  He also started taking anagrelide 0.5 mg p.o. daily in addition to his standard treatment with Revlimid.  He is here today for evaluation and repeat blood work.   MEDICAL HISTORY: Past Medical History:  Diagnosis Date  . Anemia   . Colon polyp   . Diverticulitis   . Diverticulosis   . GERD (gastroesophageal reflux disease)   . Hyperlipidemia   . IBS (irritable bowel syndrome)   . Inguinal hernia   . Prostate cancer ([place])                . Prostate cancer ([place])   . Umbilical hernia   . Vitamin D deficiency     ALLERGIES:  has No Known  Allergies.  MEDICATIONS:  Current Outpatient Medications  Medication Sig Dispense Refill  . anagrelide (AGRYLIN) 0.5 MG capsule Take 0.5 mg by mouth.    Marland Kitchen aspirin EC 81 MG tablet Take 81 mg by mouth.    . Darbepoetin Alfa (ARANESP) 100 MCG/0.5ML SOSY injection Inject into the skin.    . Multiple Vitamin (MULTI-VITAMINS) TABS Take 1 tablet by mouth daily.    . polyethylene glycol powder (GLYCOLAX/MIRALAX) powder   1  . predniSONE (DELTASONE) 10 MG tablet Take 10 mg by mouth 2 (two) times a week.  1  . vitamin B-12 (CYANOCOBALAMIN) 1000 MCG tablet Take 1 tablet by mouth daily as needed.     No current facility-administered medications for this visit.    Facility-Administered Medications Ordered in Other Visits  Medication Dose Route Frequency Provider Last Rate Last Dose  . 0.9 %  sodium chloride infusion  250 mL Intravenous Once Curt Bears, MD        SURGICAL HISTORY:  Past Surgical History:  Procedure Laterality Date  . COLONOSCOPY    . POLYPECTOMY    . PROSTATECTOMY    . TONSILLECTOMY      REVIEW OF SYSTEMS:  A comprehensive review of systems was negative except for: Constitutional: positive for fatigue   PHYSICAL EXAMINATION: General appearance: alert, cooperative, appears stated age, fatigued and no distress Head: Normocephalic, without obvious abnormality, atraumatic Neck: no adenopathy, no JVD, supple, symmetrical, trachea midline  and thyroid not enlarged, symmetric, no tenderness/mass/nodules Lymph nodes: Cervical, supraclavicular, and axillary nodes normal. Resp: clear to auscultation bilaterally Back: symmetric, no curvature. ROM normal. No CVA tenderness. Cardio: regular rate and rhythm, S1, S2 normal, no murmur, click, rub or gallop GI: soft, non-tender; bowel sounds normal; no masses,  no organomegaly Extremities: extremities normal, atraumatic, no cyanosis or edema  ECOG PERFORMANCE STATUS: 1 - Symptomatic but completely ambulatory  Blood pressure (!)  143/80, pulse 68, temperature 98 F (36.7 C), temperature source Oral, resp. rate 18, height 5\' 7"  (1.702 m), weight 165 lb (74.8 kg), SpO2 100 %.  LABORATORY DATA: Lab Results  Component Value Date   WBC 7.0 09/12/2017   HGB 10.7 (L) 09/12/2017   HCT 32.9 (L) 09/12/2017   MCV 100.0 (H) 09/12/2017   PLT 740 (H) 09/12/2017      Chemistry      Component Value Date/Time   NA 140 08/22/2017 0959   NA 141 12/08/2016 0928   K 4.3 08/22/2017 0959   K 4.5 12/08/2016 0928   CL 111 (H) 08/22/2017 0959   CO2 24 08/22/2017 0959   CO2 27 12/08/2016 0928   BUN 15 08/22/2017 0959   BUN 14.1 12/08/2016 0928   CREATININE 0.98 08/22/2017 0959   CREATININE 0.9 12/08/2016 0928      Component Value Date/Time   CALCIUM 9.0 08/22/2017 0959   CALCIUM 9.0 12/08/2016 0928   ALKPHOS 53 08/22/2017 0959   ALKPHOS 87 12/08/2016 0928   AST 23 08/22/2017 0959   AST 25 12/08/2016 0928   ALT 37 08/22/2017 0959   ALT 41 12/08/2016 0928   BILITOT 0.7 08/22/2017 0959   BILITOT 0.84 12/08/2016 0928      RADIOGRAPHIC STUDIES:  ASSESSMENT AND PLAN:  This is a very pleasant 72 years old white male with essential thrombocythemia with positive JAK-2 mutation as well as myelodysplastic syndrome. He is currently on treatment with Revlimid 5 mg by mouth daily according to the recommendation from [facility].  He is also on treatment with Aranesp every 3 weeks.  Aranesp was a change it to Procrit initially every 2 weeks and currently on weekly basis.  He mentioned that he is feeling much better on Procrit weekly and his hemoglobin has always been above 10.0 g/dL.  He would like to continue on the weekly Procrit doses.  The patient is feeling fine today with no specific complaints except for fatigue.  I recommended for him to continue his weekly doses of Procrit. For the history of MDS, he will continue his current treatment with Revlimid and anagrelide for essential thrombocythemia. The patient will come back  for follow-up visit in 2 months for evaluation and repeat blood work. He was advised to call immediately if he has any concerning symptoms in the interval. All questions were answered. The patient knows to call the clinic with any problems, questions or concerns. We can certainly see the patient much sooner if necessary. I spent 10 minutes counseling the patient face to face. The total time spent in the appointment was 15 minutes.  Disclaimer: This note was dictated with voice recognition software. Similar sounding words can inadvertently be transcribed and may not be corrected upon review.

## 2017-09-19 NOTE — Telephone Encounter (Signed)
Appointments scheduled AVS/Calendar printed per 6/11 los °

## 2017-09-21 ENCOUNTER — Telehealth: Payer: Self-pay | Admitting: Internal Medicine

## 2017-09-21 NOTE — Telephone Encounter (Signed)
Appointments added to schedule and patient will get new schedule at next visit per 6/12 staff message

## 2017-09-26 ENCOUNTER — Inpatient Hospital Stay: Payer: Medicare Other

## 2017-09-26 VITALS — BP 144/80 | HR 67 | Temp 97.9°F | Resp 18

## 2017-09-26 DIAGNOSIS — D469 Myelodysplastic syndrome, unspecified: Secondary | ICD-10-CM

## 2017-09-26 LAB — CBC WITH DIFFERENTIAL/PLATELET
BASOS ABS: 0.2 10*3/uL — AB (ref 0.0–0.1)
Basophils Relative: 4 %
Eosinophils Absolute: 0.1 10*3/uL (ref 0.0–0.5)
Eosinophils Relative: 3 %
HCT: 31.8 % — ABNORMAL LOW (ref 38.4–49.9)
Hemoglobin: 10.4 g/dL — ABNORMAL LOW (ref 13.0–17.1)
LYMPHS ABS: 1.5 10*3/uL (ref 0.9–3.3)
LYMPHS PCT: 31 %
MCH: 32.3 pg (ref 27.2–33.4)
MCHC: 32.7 g/dL (ref 32.0–36.0)
MCV: 98.8 fL — AB (ref 79.3–98.0)
MONOS PCT: 14 %
Monocytes Absolute: 0.7 10*3/uL (ref 0.1–0.9)
NRBC: 1 /100{WBCs} — AB
Neutro Abs: 2.4 10*3/uL (ref 1.5–6.5)
Neutrophils Relative %: 48 %
PLATELETS: 421 10*3/uL — AB (ref 140–400)
RBC: 3.22 MIL/uL — ABNORMAL LOW (ref 4.20–5.82)
RDW: 27.2 % — ABNORMAL HIGH (ref 11.0–14.6)
WBC: 4.8 10*3/uL (ref 4.0–10.3)

## 2017-09-26 MED ORDER — EPOETIN ALFA 40000 UNIT/ML IJ SOLN
40000.0000 [IU] | Freq: Once | INTRAMUSCULAR | Status: AC
  Administered 2017-09-26: 40000 [IU] via SUBCUTANEOUS

## 2017-09-26 MED ORDER — EPOETIN ALFA 40000 UNIT/ML IJ SOLN
INTRAMUSCULAR | Status: AC
  Filled 2017-09-26: qty 1

## 2017-09-26 NOTE — Patient Instructions (Signed)
Epoetin Alfa injection °What is this medicine? °EPOETIN ALFA (e POE e tin AL fa) helps your body make more red blood cells. This medicine is used to treat anemia caused by chronic kidney failure, cancer chemotherapy, or HIV-therapy. It may also be used before surgery if you have anemia. °This medicine may be used for other purposes; ask your health care provider or pharmacist if you have questions. °COMMON BRAND NAME(S): Epogen, Procrit °What should I tell my health care provider before I take this medicine? °They need to know if you have any of these conditions: °-blood clotting disorders °-cancer patient not on chemotherapy °-cystic fibrosis °-heart disease, such as angina or heart failure °-hemoglobin level of 12 g/dL or greater °-high blood pressure °-low levels of folate, iron, or vitamin B12 °-seizures °-an unusual or allergic reaction to erythropoietin, albumin, benzyl alcohol, hamster proteins, other medicines, foods, dyes, or preservatives °-pregnant or trying to get pregnant °-breast-feeding °How should I use this medicine? °This medicine is for injection into a vein or under the skin. It is usually given by a health care professional in a hospital or clinic setting. °If you get this medicine at home, you will be taught how to prepare and give this medicine. Use exactly as directed. Take your medicine at regular intervals. Do not take your medicine more often than directed. °It is important that you put your used needles and syringes in a special sharps container. Do not put them in a trash can. If you do not have a sharps container, call your pharmacist or healthcare provider to get one. °A special MedGuide will be given to you by the pharmacist with each prescription and refill. Be sure to read this information carefully each time. °Talk to your pediatrician regarding the use of this medicine in children. While this drug may be prescribed for selected conditions, precautions do apply. °Overdosage: If you  think you have taken too much of this medicine contact a poison control center or emergency room at once. °NOTE: This medicine is only for you. Do not share this medicine with others. °What if I miss a dose? °If you miss a dose, take it as soon as you can. If it is almost time for your next dose, take only that dose. Do not take double or extra doses. °What may interact with this medicine? °Do not take this medicine with any of the following medications: °-darbepoetin alfa °This list may not describe all possible interactions. Give your health care provider a list of all the medicines, herbs, non-prescription drugs, or dietary supplements you use. Also tell them if you smoke, drink alcohol, or use illegal drugs. Some items may interact with your medicine. °What should I watch for while using this medicine? °Your condition will be monitored carefully while you are receiving this medicine. °You may need blood work done while you are taking this medicine. °What side effects may I notice from receiving this medicine? °Side effects that you should report to your doctor or health care professional as soon as possible: °-allergic reactions like skin rash, itching or hives, swelling of the face, lips, or tongue °-breathing problems °-changes in vision °-chest pain °-confusion, trouble speaking or understanding °-feeling faint or lightheaded, falls °-high blood pressure °-muscle aches or pains °-pain, swelling, warmth in the leg °-rapid weight gain °-severe headaches °-sudden numbness or weakness of the face, arm or leg °-trouble walking, dizziness, loss of balance or coordination °-seizures (convulsions) °-swelling of the ankles, feet, hands °-unusually weak or tired °  Side effects that usually do not require medical attention (report to your doctor or health care professional if they continue or are bothersome): °-diarrhea °-fever, chills (flu-like symptoms) °-headaches °-nausea, vomiting °-redness, stinging, or swelling at  site where injected °This list may not describe all possible side effects. Call your doctor for medical advice about side effects. You may report side effects to FDA at 1-800-FDA-1088. °Where should I keep my medicine? °Keep out of the reach of children. °Store in a refrigerator between 2 and 8 degrees C (36 and 46 degrees F). Do not freeze or shake. Throw away any unused portion if using a single-dose vial. Multi-dose vials can be kept in the refrigerator for up to 21 days after the initial dose. Throw away unused medicine. °NOTE: This sheet is a summary. It may not cover all possible information. If you have questions about this medicine, talk to your doctor, pharmacist, or health care provider. °© 2018 [employer] (2015-11-16 19:42:31) ° °

## 2017-09-28 ENCOUNTER — Encounter: Payer: Self-pay | Admitting: Neurology

## 2017-09-28 ENCOUNTER — Ambulatory Visit: Payer: Medicare Other | Admitting: Neurology

## 2017-09-28 VITALS — BP 118/65 | HR 78 | Ht 67.0 in | Wt 164.5 lb

## 2017-09-28 DIAGNOSIS — R413 Other amnesia: Secondary | ICD-10-CM

## 2017-09-28 DIAGNOSIS — G479 Sleep disorder, unspecified: Secondary | ICD-10-CM | POA: Diagnosis not present

## 2017-09-28 MED ORDER — GABAPENTIN 100 MG PO CAPS: ORAL_CAPSULE | ORAL | 3 refills | Status: DC

## 2017-09-28 NOTE — Progress Notes (Signed)
Reason for visit: Confusion  Referring physician: Dr. Morey [name] is a 72 y.o. male  History of present illness:  [name] is a 72 year old right-handed white male with a history of myelodysplasia.  The patient has had some problems over the last 1 year associated with occasional episodes of near syncope sensation that may occur 2 times a month.  The patient will feel dizzy and lightheaded with these episodes.  The patient has a more persistent pressure sensation that migrates about the head that is present at all times, it may be worse at sometimes than others.  The patient has some blurring sensation involving the right eye.  He will have significant undulations in his ability to focus.  In the evening hours, he may feel much better and he is able to think more clearly, around 2 PM in the afternoon he has a low point where he is unable to cognitively function well.  He reports a lot of word finding problems, and difficulty with focusing while reading.  The patient apparently snores quite a bit at night, his wife is noted frequent jerks and twitches at night.  The patient does have a restless type sensation in the arms and legs when he first goes to bed.  The patient may have to get up every 2 hours to use the bathroom.  He does report some fatigue during the day, but this is not severe.  He reports some occasional tingling in the hands, he does have neck discomfort.  He reports no significant issues with balance, no falls, no difficulty controlling the bowels or the bladder.  He is sent to this office for further evaluation.  He has undergone MRI of the brain that was relatively unremarkable.  Past Medical History:  Diagnosis Date  . Anemia   . Colon polyp   . Diverticulitis   . Diverticulosis   . GERD (gastroesophageal reflux disease)   . Hyperlipidemia   . IBS (irritable bowel syndrome)   . Inguinal hernia   . Prostate cancer ([place])                . Prostate cancer ([place])     . Umbilical hernia   . Vitamin D deficiency     Past Surgical History:  Procedure Laterality Date  . COLONOSCOPY    . POLYPECTOMY    . PROSTATECTOMY    . TONSILLECTOMY      Family History  Problem Relation Age of Onset  . Heart disease Father   . Colon polyps Father   . Lung cancer Father        cancerous mole  . Prostate cancer Father   . Osteoarthritis Mother   . Breast cancer Mother   . Colon cancer Neg Hx   . Esophageal cancer Neg Hx   . Rectal cancer Neg Hx   . Stomach cancer Neg Hx     Social history:  reports that he quit smoking about 44 years ago. He has never used smokeless tobacco. He reports that he drinks about 4.2 oz of alcohol per week. He reports that he does not use drugs.  Medications:  Prior to Admission medications   Medication Sig Start Date End Date Taking? Authorizing Provider  amLODipine (NORVASC) 5 MG tablet Take 5 mg by mouth daily. 08/30/17  Yes [provider]  anagrelide (AGRYLIN) 0.5 MG capsule Take 0.5 mg by mouth.   Yes [provider]  aspirin EC 81 MG tablet Take  81 mg by mouth.   Yes [provider]  Darbepoetin Alfa (ARANESP) 100 MCG/0.5ML SOSY injection Inject into the skin.   Yes [provider]  lenalidomide (REVLIMID) 5 MG capsule Take by mouth. 09/26/17 10/17/17 Yes [provider]  Multiple Vitamin (MULTI-VITAMINS) TABS Take 1 tablet by mouth daily.   Yes [provider]  vitamin B-12 (CYANOCOBALAMIN) 1000 MCG tablet Take 1 tablet by mouth daily as needed.   Yes [provider]  gabapentin (NEURONTIN) 100 MG capsule One capsule twice a day, 2 at night 09/28/17   Kathrynn Ducking, MD     No Known Allergies  ROS:  Out of a complete 14 system review of symptoms, the patient complains only of the following symptoms, and all other reviewed systems are negative.  Memory loss Confusion, headache  Blood pressure 118/65, pulse 78, height 5\' 7"  (1.702 m), weight 164 lb 8 oz  (74.6 kg).  Physical Exam  General: The patient is alert and cooperative at the time of the examination.  Eyes: Pupils are equal, round, and reactive to light. Discs are flat bilaterally.  Neck: The neck is supple, no carotid bruits are noted.  Respiratory: The respiratory examination is clear.  Cardiovascular: The cardiovascular examination reveals a regular rate and rhythm, no obvious murmurs or rubs are noted.  Skin: Extremities are without significant edema.  Neurologic Exam  Mental status: The patient is alert and oriented x 3 at the time of the examination. The patient has apparent normal recent and remote memory, with an apparently normal attention span and concentration ability.  Cranial nerves: Facial symmetry is present. There is good sensation of the face to pinprick and soft touch bilaterally. The strength of the facial muscles and the muscles to head turning and shoulder shrug are normal bilaterally. Speech is well enunciated, no aphasia or dysarthria is noted. Extraocular movements are full. Visual fields are full. The tongue is midline, and the patient has symmetric elevation of the soft palate. No obvious hearing deficits are noted.  Motor: The motor testing reveals 5 over 5 strength of all 4 extremities. Good symmetric motor tone is noted throughout.  Sensory: Sensory testing is intact to pinprick, soft touch, vibration sensation, and position sense on all 4 extremities. No evidence of extinction is noted.  Coordination: Cerebellar testing reveals good finger-nose-finger and heel-to-shin bilaterally.  Gait and station: Gait is normal. Tandem gait is normal. Romberg is negative. No drift is seen.  Reflexes: Deep tendon reflexes are symmetric and normal bilaterally, with exception that the ankle jerk reflexes are depressed bilaterally. Toes are downgoing bilaterally.   MRI brain 09/15/17:  IMPRESSION: Negative MRI of the brain. No acute or focal lesion to explain  the patient's confusion or word-finding difficulties.  * MRI scan images were reviewed online. I agree with the written report.    Assessment/Plan:  1.  Reports of pressure in the head, possible tension headache  2.  Mild memory loss, confusion  The patient is having episodes of confusion during the day, and at other times he seems to think quite well.  The patient snores at night, he has frequent jerks of the arms and legs at night, he may have a periodic limb movement disorder or possibly sleep apnea.  The patient will be sent for blood work today, he will have a sleep evaluation.  The patient will be placed on low-dose gabapentin taking 100 mg twice during the day and 2 at night.  The patient will follow-up  through this office in about 4 months.  Jill Alexanders MD 09/28/2017 4:49 PM  [facility] [address] [place],  [address]  Phone [phone] Fax [phone]

## 2017-09-28 NOTE — Patient Instructions (Addendum)
   We we check a sleep evaluation. We will start gabapentin, take 100 mg twice during the day and 2 at night.  Neurontin (gabapentin) may result in drowsiness, ankle swelling, gait instability, or possibly dizziness. Please contact our office if significant side effects occur with this medication.

## 2017-09-30 LAB — ENA+DNA/DS+SJORGEN'S
DSDNA AB: 14 [IU]/mL — AB (ref 0–9)
ENA RNP Ab: <0.2 AI (ref 0.0–0.9)
ENA SM Ab Ser-aCnc: <0.2 AI (ref 0.0–0.9)

## 2017-09-30 LAB — B. BURGDORFI ANTIBODIES: Lyme IgG/IgM Ab: <0.91 {ISR} (ref 0.00–0.90)

## 2017-09-30 LAB — HIV ANTIBODY (ROUTINE TESTING W REFLEX): HIV SCREEN 4TH GENERATION: NONREACTIVE

## 2017-09-30 LAB — VITAMIN B12: VITAMIN B 12: 1525 pg/mL — AB (ref 232–1245)

## 2017-09-30 LAB — RPR: RPR Ser Ql: NONREACTIVE

## 2017-09-30 LAB — ANA W/REFLEX: Anti Nuclear Antibody(ANA): POSITIVE — AB

## 2017-09-30 LAB — SEDIMENTATION RATE: SED RATE: 5 mm/h (ref 0–30)

## 2017-10-03 ENCOUNTER — Inpatient Hospital Stay: Payer: Medicare Other

## 2017-10-03 VITALS — BP 129/81 | HR 70 | Temp 98.1°F | Resp 16

## 2017-10-03 DIAGNOSIS — D469 Myelodysplastic syndrome, unspecified: Secondary | ICD-10-CM

## 2017-10-03 LAB — CBC WITH DIFFERENTIAL/PLATELET
BASOS ABS: 0 10*3/uL (ref 0.0–0.1)
Basophils Relative: 1 %
Eosinophils Absolute: 0.6 10*3/uL — ABNORMAL HIGH (ref 0.0–0.5)
Eosinophils Relative: 11 %
HEMATOCRIT: 32.3 % — AB (ref 38.4–49.9)
HEMOGLOBIN: 10.6 g/dL — AB (ref 13.0–17.1)
LYMPHS PCT: 27 %
Lymphs Abs: 1.3 10*3/uL (ref 0.9–3.3)
MCH: 33.4 pg (ref 27.2–33.4)
MCHC: 32.6 g/dL (ref 32.0–36.0)
MCV: 102.5 fL — ABNORMAL HIGH (ref 79.3–98.0)
MONO ABS: 0.4 10*3/uL (ref 0.1–0.9)
Monocytes Relative: 8 %
Neutro Abs: 2.7 10*3/uL (ref 1.5–6.5)
Neutrophils Relative %: 53 %
Platelets: 396 10*3/uL (ref 140–400)
RBC: 3.16 MIL/uL — AB (ref 4.20–5.82)
RDW: 27.9 % — ABNORMAL HIGH (ref 11.0–14.6)
WBC: 5 10*3/uL (ref 4.0–10.3)
nRBC: 3 /100 WBC — ABNORMAL HIGH

## 2017-10-03 MED ORDER — EPOETIN ALFA 40000 UNIT/ML IJ SOLN
40000.0000 [IU] | Freq: Once | INTRAMUSCULAR | Status: AC
  Administered 2017-10-03: 40000 [IU] via SUBCUTANEOUS

## 2017-10-03 NOTE — Patient Instructions (Signed)
Epoetin Alfa injection °What is this medicine? °EPOETIN ALFA (e POE e tin AL fa) helps your body make more red blood cells. This medicine is used to treat anemia caused by chronic kidney failure, cancer chemotherapy, or HIV-therapy. It may also be used before surgery if you have anemia. °This medicine may be used for other purposes; ask your health care provider or pharmacist if you have questions. °COMMON BRAND NAME(S): Epogen, Procrit °What should I tell my health care provider before I take this medicine? °They need to know if you have any of these conditions: °-blood clotting disorders °-cancer patient not on chemotherapy °-cystic fibrosis °-heart disease, such as angina or heart failure °-hemoglobin level of 12 g/dL or greater °-high blood pressure °-low levels of folate, iron, or vitamin B12 °-seizures °-an unusual or allergic reaction to erythropoietin, albumin, benzyl alcohol, hamster proteins, other medicines, foods, dyes, or preservatives °-pregnant or trying to get pregnant °-breast-feeding °How should I use this medicine? °This medicine is for injection into a vein or under the skin. It is usually given by a health care professional in a hospital or clinic setting. °If you get this medicine at home, you will be taught how to prepare and give this medicine. Use exactly as directed. Take your medicine at regular intervals. Do not take your medicine more often than directed. °It is important that you put your used needles and syringes in a special sharps container. Do not put them in a trash can. If you do not have a sharps container, call your pharmacist or healthcare provider to get one. °A special MedGuide will be given to you by the pharmacist with each prescription and refill. Be sure to read this information carefully each time. °Talk to your pediatrician regarding the use of this medicine in children. While this drug may be prescribed for selected conditions, precautions do apply. °Overdosage: If you  think you have taken too much of this medicine contact a poison control center or emergency room at once. °NOTE: This medicine is only for you. Do not share this medicine with others. °What if I miss a dose? °If you miss a dose, take it as soon as you can. If it is almost time for your next dose, take only that dose. Do not take double or extra doses. °What may interact with this medicine? °Do not take this medicine with any of the following medications: °-darbepoetin alfa °This list may not describe all possible interactions. Give your health care provider a list of all the medicines, herbs, non-prescription drugs, or dietary supplements you use. Also tell them if you smoke, drink alcohol, or use illegal drugs. Some items may interact with your medicine. °What should I watch for while using this medicine? °Your condition will be monitored carefully while you are receiving this medicine. °You may need blood work done while you are taking this medicine. °What side effects may I notice from receiving this medicine? °Side effects that you should report to your doctor or health care professional as soon as possible: °-allergic reactions like skin rash, itching or hives, swelling of the face, lips, or tongue °-breathing problems °-changes in vision °-chest pain °-confusion, trouble speaking or understanding °-feeling faint or lightheaded, falls °-high blood pressure °-muscle aches or pains °-pain, swelling, warmth in the leg °-rapid weight gain °-severe headaches °-sudden numbness or weakness of the face, arm or leg °-trouble walking, dizziness, loss of balance or coordination °-seizures (convulsions) °-swelling of the ankles, feet, hands °-unusually weak or tired °  Side effects that usually do not require medical attention (report to your doctor or health care professional if they continue or are bothersome): °-diarrhea °-fever, chills (flu-like symptoms) °-headaches °-nausea, vomiting °-redness, stinging, or swelling at  site where injected °This list may not describe all possible side effects. Call your doctor for medical advice about side effects. You may report side effects to FDA at 1-800-FDA-1088. °Where should I keep my medicine? °Keep out of the reach of children. °Store in a refrigerator between 2 and 8 degrees C (36 and 46 degrees F). Do not freeze or shake. Throw away any unused portion if using a single-dose vial. Multi-dose vials can be kept in the refrigerator for up to 21 days after the initial dose. Throw away unused medicine. °NOTE: This sheet is a summary. It may not cover all possible information. If you have questions about this medicine, talk to your doctor, pharmacist, or health care provider. °© 2018 [employer] (2015-11-16 19:42:31) ° °

## 2017-10-09 ENCOUNTER — Other Ambulatory Visit: Payer: Self-pay | Admitting: *Deleted

## 2017-10-09 DIAGNOSIS — D473 Essential (hemorrhagic) thrombocythemia: Secondary | ICD-10-CM

## 2017-10-09 DIAGNOSIS — D469 Myelodysplastic syndrome, unspecified: Secondary | ICD-10-CM

## 2017-10-10 ENCOUNTER — Ambulatory Visit: Payer: Medicare Other

## 2017-10-10 ENCOUNTER — Inpatient Hospital Stay: Payer: Medicare Other

## 2017-10-10 ENCOUNTER — Inpatient Hospital Stay: Payer: Medicare Other | Attending: Internal Medicine

## 2017-10-10 DIAGNOSIS — D469 Myelodysplastic syndrome, unspecified: Secondary | ICD-10-CM | POA: Diagnosis not present

## 2017-10-10 DIAGNOSIS — D473 Essential (hemorrhagic) thrombocythemia: Secondary | ICD-10-CM | POA: Insufficient documentation

## 2017-10-10 LAB — CBC WITH DIFFERENTIAL (CANCER CENTER ONLY)
BASOS ABS: 0.1 10*3/uL (ref 0.0–0.1)
Basophils Relative: 2 %
EOS ABS: 0.8 10*3/uL — AB (ref 0.0–0.5)
EOS PCT: 10 %
HCT: 34.1 % — ABNORMAL LOW (ref 38.4–49.9)
Hemoglobin: 11.1 g/dL — ABNORMAL LOW (ref 13.0–17.1)
Lymphocytes Relative: 25 %
Lymphs Abs: 2.1 10*3/uL (ref 0.9–3.3)
MCH: 32.2 pg (ref 27.2–33.4)
MCHC: 32.6 g/dL (ref 32.0–36.0)
MCV: 98.8 fL — ABNORMAL HIGH (ref 79.3–98.0)
Monocytes Absolute: 1 10*3/uL — ABNORMAL HIGH (ref 0.1–0.9)
Monocytes Relative: 12 %
NEUTROS PCT: 51 %
NRBC: 1 /100{WBCs} — AB
Neutro Abs: 4.5 10*3/uL (ref 1.5–6.5)
PLATELETS: 526 10*3/uL — AB (ref 140–400)
RBC: 3.45 MIL/uL — AB (ref 4.20–5.82)
RDW: 29.1 % — AB (ref 11.0–14.6)
WBC Count: 8.6 10*3/uL (ref 4.0–10.3)

## 2017-10-10 NOTE — Progress Notes (Signed)
Pt hgb is 11.1 today no injection needed. Gave copy of labs to patient

## 2017-10-16 ENCOUNTER — Other Ambulatory Visit: Payer: Self-pay | Admitting: Medical Oncology

## 2017-10-16 DIAGNOSIS — D469 Myelodysplastic syndrome, unspecified: Secondary | ICD-10-CM

## 2017-10-17 ENCOUNTER — Inpatient Hospital Stay: Payer: Medicare Other

## 2017-10-17 VITALS — BP 128/76 | HR 72 | Temp 98.2°F | Resp 16

## 2017-10-17 DIAGNOSIS — D469 Myelodysplastic syndrome, unspecified: Secondary | ICD-10-CM

## 2017-10-17 LAB — CBC WITH DIFFERENTIAL (CANCER CENTER ONLY)
BASOS ABS: 0.1 10*3/uL (ref 0.0–0.1)
Basophils Relative: 3 %
EOS ABS: 0.5 10*3/uL (ref 0.0–0.5)
Eosinophils Relative: 9 %
HEMATOCRIT: 33.2 % — AB (ref 38.4–49.9)
HEMOGLOBIN: 10.6 g/dL — AB (ref 13.0–17.1)
Lymphocytes Relative: 32 %
Lymphs Abs: 1.6 10*3/uL (ref 0.9–3.3)
MCH: 31.8 pg (ref 27.2–33.4)
MCHC: 31.9 g/dL — ABNORMAL LOW (ref 32.0–36.0)
MCV: 99.7 fL — ABNORMAL HIGH (ref 79.3–98.0)
Monocytes Absolute: 0.6 10*3/uL (ref 0.1–0.9)
Monocytes Relative: 12 %
NEUTROS ABS: 2.2 10*3/uL (ref 1.5–6.5)
NEUTROS PCT: 44 %
Platelet Count: 315 10*3/uL (ref 140–400)
RBC: 3.33 MIL/uL — ABNORMAL LOW (ref 4.20–5.82)
RDW: 27.7 % — ABNORMAL HIGH (ref 11.0–14.6)
WBC Count: 5 10*3/uL (ref 4.0–10.3)

## 2017-10-17 MED ORDER — EPOETIN ALFA 40000 UNIT/ML IJ SOLN
40000.0000 [IU] | Freq: Once | INTRAMUSCULAR | Status: AC
  Administered 2017-10-17: 40000 [IU] via SUBCUTANEOUS

## 2017-10-17 NOTE — Patient Instructions (Signed)
Epoetin Alfa injection °What is this medicine? °EPOETIN ALFA (e POE e tin AL fa) helps your body make more red blood cells. This medicine is used to treat anemia caused by chronic kidney failure, cancer chemotherapy, or HIV-therapy. It may also be used before surgery if you have anemia. °This medicine may be used for other purposes; ask your health care provider or pharmacist if you have questions. °COMMON BRAND NAME(S): Epogen, Procrit °What should I tell my health care provider before I take this medicine? °They need to know if you have any of these conditions: °-blood clotting disorders °-cancer patient not on chemotherapy °-cystic fibrosis °-heart disease, such as angina or heart failure °-hemoglobin level of 12 g/dL or greater °-high blood pressure °-low levels of folate, iron, or vitamin B12 °-seizures °-an unusual or allergic reaction to erythropoietin, albumin, benzyl alcohol, hamster proteins, other medicines, foods, dyes, or preservatives °-pregnant or trying to get pregnant °-breast-feeding °How should I use this medicine? °This medicine is for injection into a vein or under the skin. It is usually given by a health care professional in a hospital or clinic setting. °If you get this medicine at home, you will be taught how to prepare and give this medicine. Use exactly as directed. Take your medicine at regular intervals. Do not take your medicine more often than directed. °It is important that you put your used needles and syringes in a special sharps container. Do not put them in a trash can. If you do not have a sharps container, call your pharmacist or healthcare provider to get one. °A special MedGuide will be given to you by the pharmacist with each prescription and refill. Be sure to read this information carefully each time. °Talk to your pediatrician regarding the use of this medicine in children. While this drug may be prescribed for selected conditions, precautions do apply. °Overdosage: If you  think you have taken too much of this medicine contact a poison control center or emergency room at once. °NOTE: This medicine is only for you. Do not share this medicine with others. °What if I miss a dose? °If you miss a dose, take it as soon as you can. If it is almost time for your next dose, take only that dose. Do not take double or extra doses. °What may interact with this medicine? °Do not take this medicine with any of the following medications: °-darbepoetin alfa °This list may not describe all possible interactions. Give your health care provider a list of all the medicines, herbs, non-prescription drugs, or dietary supplements you use. Also tell them if you smoke, drink alcohol, or use illegal drugs. Some items may interact with your medicine. °What should I watch for while using this medicine? °Your condition will be monitored carefully while you are receiving this medicine. °You may need blood work done while you are taking this medicine. °What side effects may I notice from receiving this medicine? °Side effects that you should report to your doctor or health care professional as soon as possible: °-allergic reactions like skin rash, itching or hives, swelling of the face, lips, or tongue °-breathing problems °-changes in vision °-chest pain °-confusion, trouble speaking or understanding °-feeling faint or lightheaded, falls °-high blood pressure °-muscle aches or pains °-pain, swelling, warmth in the leg °-rapid weight gain °-severe headaches °-sudden numbness or weakness of the face, arm or leg °-trouble walking, dizziness, loss of balance or coordination °-seizures (convulsions) °-swelling of the ankles, feet, hands °-unusually weak or tired °  Side effects that usually do not require medical attention (report to your doctor or health care professional if they continue or are bothersome): °-diarrhea °-fever, chills (flu-like symptoms) °-headaches °-nausea, vomiting °-redness, stinging, or swelling at  site where injected °This list may not describe all possible side effects. Call your doctor for medical advice about side effects. You may report side effects to FDA at 1-800-FDA-1088. °Where should I keep my medicine? °Keep out of the reach of children. °Store in a refrigerator between 2 and 8 degrees C (36 and 46 degrees F). Do not freeze or shake. Throw away any unused portion if using a single-dose vial. Multi-dose vials can be kept in the refrigerator for up to 21 days after the initial dose. Throw away unused medicine. °NOTE: This sheet is a summary. It may not cover all possible information. If you have questions about this medicine, talk to your doctor, pharmacist, or health care provider. °© 2018 [employer] (2015-11-16 19:42:31) ° °

## 2017-10-18 MED ORDER — TBO-FILGRASTIM 480 MCG/0.8ML ~~LOC~~ SOSY
PREFILLED_SYRINGE | SUBCUTANEOUS | Status: AC
Start: 2017-10-18 — End: ?
  Filled 2017-10-18: qty 0.8

## 2017-10-20 ENCOUNTER — Telehealth: Payer: Self-pay | Admitting: Internal Medicine

## 2017-10-20 NOTE — Telephone Encounter (Signed)
Patient called in 7/12 to cancel 7/16 appt due to being out of town

## 2017-10-24 ENCOUNTER — Other Ambulatory Visit: Payer: Medicare Other

## 2017-10-24 ENCOUNTER — Ambulatory Visit: Payer: Medicare Other

## 2017-10-31 ENCOUNTER — Other Ambulatory Visit: Payer: Self-pay | Admitting: Medical Oncology

## 2017-10-31 ENCOUNTER — Inpatient Hospital Stay: Payer: Medicare Other

## 2017-10-31 DIAGNOSIS — D469 Myelodysplastic syndrome, unspecified: Secondary | ICD-10-CM

## 2017-10-31 LAB — CBC WITH DIFFERENTIAL (CANCER CENTER ONLY)
Basophils Absolute: 0.1 10*3/uL (ref 0.0–0.1)
Basophils Relative: 2 %
Eosinophils Absolute: 0.5 10*3/uL (ref 0.0–0.5)
Eosinophils Relative: 9 %
HEMATOCRIT: 34.2 % — AB (ref 38.4–49.9)
Hemoglobin: 11 g/dL — ABNORMAL LOW (ref 13.0–17.1)
LYMPHS PCT: 28 %
Lymphs Abs: 1.5 10*3/uL (ref 0.9–3.3)
MCH: 32.1 pg (ref 27.2–33.4)
MCHC: 32.2 g/dL (ref 32.0–36.0)
MCV: 99.7 fL — AB (ref 79.3–98.0)
Monocytes Absolute: 0.5 10*3/uL (ref 0.1–0.9)
Monocytes Relative: 9 %
NEUTROS ABS: 2.8 10*3/uL (ref 1.5–6.5)
Neutrophils Relative %: 52 %
Platelet Count: 585 10*3/uL — ABNORMAL HIGH (ref 140–400)
RBC: 3.43 MIL/uL — AB (ref 4.20–5.82)
RDW: 27.7 % — ABNORMAL HIGH (ref 11.0–14.6)
WBC: 5.3 10*3/uL (ref 4.0–10.3)

## 2017-10-31 MED ORDER — EPOETIN ALFA 40000 UNIT/ML IJ SOLN
INTRAMUSCULAR | Status: AC
  Filled 2017-10-31: qty 1

## 2017-10-31 NOTE — Progress Notes (Signed)
Pt hgb is 11 today no injection needed  

## 2017-11-06 ENCOUNTER — Other Ambulatory Visit: Payer: Self-pay | Admitting: *Deleted

## 2017-11-06 DIAGNOSIS — D469 Myelodysplastic syndrome, unspecified: Secondary | ICD-10-CM

## 2017-11-07 ENCOUNTER — Inpatient Hospital Stay: Payer: Medicare Other

## 2017-11-07 VITALS — BP 132/78 | HR 72 | Temp 98.2°F | Resp 17

## 2017-11-07 DIAGNOSIS — D469 Myelodysplastic syndrome, unspecified: Secondary | ICD-10-CM | POA: Diagnosis not present

## 2017-11-07 LAB — CBC WITH DIFFERENTIAL (CANCER CENTER ONLY)
BASOS ABS: 0.1 10*3/uL (ref 0.0–0.1)
Basophils Relative: 2 %
EOS PCT: 10 %
Eosinophils Absolute: 0.7 10*3/uL — ABNORMAL HIGH (ref 0.0–0.5)
HEMATOCRIT: 32 % — AB (ref 38.4–49.9)
Hemoglobin: 10.4 g/dL — ABNORMAL LOW (ref 13.0–17.1)
LYMPHS ABS: 1.6 10*3/uL (ref 0.9–3.3)
LYMPHS PCT: 24 %
MCH: 31.8 pg (ref 27.2–33.4)
MCHC: 32.5 g/dL (ref 32.0–36.0)
MCV: 97.9 fL (ref 79.3–98.0)
MONO ABS: 1 10*3/uL — AB (ref 0.1–0.9)
MONOS PCT: 15 %
NEUTROS ABS: 3.3 10*3/uL (ref 1.5–6.5)
Neutrophils Relative %: 49 %
Platelet Count: 688 10*3/uL — ABNORMAL HIGH (ref 140–400)
RBC: 3.27 MIL/uL — ABNORMAL LOW (ref 4.20–5.82)
RDW: 27.7 % — AB (ref 11.0–14.6)
WBC Count: 6.7 10*3/uL (ref 4.0–10.3)

## 2017-11-07 LAB — CMP (CANCER CENTER ONLY)
ALBUMIN: 4.1 g/dL (ref 3.5–5.0)
ALT: 33 U/L (ref 0–44)
AST: 21 U/L (ref 15–41)
Alkaline Phosphatase: 83 U/L (ref 38–126)
Anion gap: 6 (ref 5–15)
BUN: 21 mg/dL (ref 8–23)
CHLORIDE: 108 mmol/L (ref 98–111)
CO2: 27 mmol/L (ref 22–32)
Calcium: 9.3 mg/dL (ref 8.9–10.3)
Creatinine: 0.88 mg/dL (ref 0.61–1.24)
GFR, Est AFR Am: >60 mL/min (ref >60)
GFR, Estimated: >60 mL/min (ref >60)
GLUCOSE: 100 mg/dL — AB (ref 70–99)
POTASSIUM: 4.9 mmol/L (ref 3.5–5.1)
SODIUM: 141 mmol/L (ref 135–145)
Total Bilirubin: 0.8 mg/dL (ref 0.3–1.2)
Total Protein: 6.8 g/dL (ref 6.5–8.1)

## 2017-11-07 MED ORDER — EPOETIN ALFA 40000 UNIT/ML IJ SOLN
40000.0000 [IU] | Freq: Once | INTRAMUSCULAR | Status: AC
  Administered 2017-11-07: 40000 [IU] via SUBCUTANEOUS

## 2017-11-07 NOTE — Progress Notes (Signed)
Checked with Dr Inda Merlin as far as pts platelet levels. He said it was ok to give procrit today but patient needs to check with [facility] who manages pts other medications about adjusting them. Gave copy of labs to pt

## 2017-11-07 NOTE — Patient Instructions (Signed)
Epoetin Alfa injection °What is this medicine? °EPOETIN ALFA (e POE e tin AL fa) helps your body make more red blood cells. This medicine is used to treat anemia caused by chronic kidney failure, cancer chemotherapy, or HIV-therapy. It may also be used before surgery if you have anemia. °This medicine may be used for other purposes; ask your health care provider or pharmacist if you have questions. °COMMON BRAND NAME(S): Epogen, Procrit °What should I tell my health care provider before I take this medicine? °They need to know if you have any of these conditions: °-blood clotting disorders °-cancer patient not on chemotherapy °-cystic fibrosis °-heart disease, such as angina or heart failure °-hemoglobin level of 12 g/dL or greater °-high blood pressure °-low levels of folate, iron, or vitamin B12 °-seizures °-an unusual or allergic reaction to erythropoietin, albumin, benzyl alcohol, hamster proteins, other medicines, foods, dyes, or preservatives °-pregnant or trying to get pregnant °-breast-feeding °How should I use this medicine? °This medicine is for injection into a vein or under the skin. It is usually given by a health care professional in a hospital or clinic setting. °If you get this medicine at home, you will be taught how to prepare and give this medicine. Use exactly as directed. Take your medicine at regular intervals. Do not take your medicine more often than directed. °It is important that you put your used needles and syringes in a special sharps container. Do not put them in a trash can. If you do not have a sharps container, call your pharmacist or healthcare provider to get one. °A special MedGuide will be given to you by the pharmacist with each prescription and refill. Be sure to read this information carefully each time. °Talk to your pediatrician regarding the use of this medicine in children. While this drug may be prescribed for selected conditions, precautions do apply. °Overdosage: If you  think you have taken too much of this medicine contact a poison control center or emergency room at once. °NOTE: This medicine is only for you. Do not share this medicine with others. °What if I miss a dose? °If you miss a dose, take it as soon as you can. If it is almost time for your next dose, take only that dose. Do not take double or extra doses. °What may interact with this medicine? °Do not take this medicine with any of the following medications: °-darbepoetin alfa °This list may not describe all possible interactions. Give your health care provider a list of all the medicines, herbs, non-prescription drugs, or dietary supplements you use. Also tell them if you smoke, drink alcohol, or use illegal drugs. Some items may interact with your medicine. °What should I watch for while using this medicine? °Your condition will be monitored carefully while you are receiving this medicine. °You may need blood work done while you are taking this medicine. °What side effects may I notice from receiving this medicine? °Side effects that you should report to your doctor or health care professional as soon as possible: °-allergic reactions like skin rash, itching or hives, swelling of the face, lips, or tongue °-breathing problems °-changes in vision °-chest pain °-confusion, trouble speaking or understanding °-feeling faint or lightheaded, falls °-high blood pressure °-muscle aches or pains °-pain, swelling, warmth in the leg °-rapid weight gain °-severe headaches °-sudden numbness or weakness of the face, arm or leg °-trouble walking, dizziness, loss of balance or coordination °-seizures (convulsions) °-swelling of the ankles, feet, hands °-unusually weak or tired °  Side effects that usually do not require medical attention (report to your doctor or health care professional if they continue or are bothersome): °-diarrhea °-fever, chills (flu-like symptoms) °-headaches °-nausea, vomiting °-redness, stinging, or swelling at  site where injected °This list may not describe all possible side effects. Call your doctor for medical advice about side effects. You may report side effects to FDA at 1-800-FDA-1088. °Where should I keep my medicine? °Keep out of the reach of children. °Store in a refrigerator between 2 and 8 degrees C (36 and 46 degrees F). Do not freeze or shake. Throw away any unused portion if using a single-dose vial. Multi-dose vials can be kept in the refrigerator for up to 21 days after the initial dose. Throw away unused medicine. °NOTE: This sheet is a summary. It may not cover all possible information. If you have questions about this medicine, talk to your doctor, pharmacist, or health care provider. °© 2018 [employer] (2015-11-16 19:42:31) ° °

## 2017-11-09 ENCOUNTER — Telehealth: Payer: Self-pay | Admitting: Internal Medicine

## 2017-11-09 NOTE — Telephone Encounter (Signed)
Called pt re adding lab and inj on 8/6 per 7/30 sch msg - spoke w/ pt re appts

## 2017-11-13 ENCOUNTER — Other Ambulatory Visit: Payer: Self-pay | Admitting: Medical Oncology

## 2017-11-13 DIAGNOSIS — D469 Myelodysplastic syndrome, unspecified: Secondary | ICD-10-CM

## 2017-11-14 ENCOUNTER — Inpatient Hospital Stay: Payer: Medicare Other

## 2017-11-14 ENCOUNTER — Inpatient Hospital Stay: Payer: Medicare Other | Attending: Internal Medicine

## 2017-11-14 VITALS — BP 131/69 | HR 68 | Temp 98.1°F | Resp 18

## 2017-11-14 DIAGNOSIS — R5383 Other fatigue: Secondary | ICD-10-CM | POA: Diagnosis not present

## 2017-11-14 DIAGNOSIS — Z8546 Personal history of malignant neoplasm of prostate: Secondary | ICD-10-CM | POA: Diagnosis not present

## 2017-11-14 DIAGNOSIS — D469 Myelodysplastic syndrome, unspecified: Secondary | ICD-10-CM | POA: Diagnosis not present

## 2017-11-14 DIAGNOSIS — Z7902 Long term (current) use of antithrombotics/antiplatelets: Secondary | ICD-10-CM | POA: Insufficient documentation

## 2017-11-14 DIAGNOSIS — Z79899 Other long term (current) drug therapy: Secondary | ICD-10-CM | POA: Insufficient documentation

## 2017-11-14 DIAGNOSIS — Z7982 Long term (current) use of aspirin: Secondary | ICD-10-CM | POA: Diagnosis not present

## 2017-11-14 DIAGNOSIS — D473 Essential (hemorrhagic) thrombocythemia: Secondary | ICD-10-CM | POA: Insufficient documentation

## 2017-11-14 LAB — CMP (CANCER CENTER ONLY)
ALT: 41 U/L (ref 0–44)
AST: 21 U/L (ref 15–41)
Albumin: 3.9 g/dL (ref 3.5–5.0)
Alkaline Phosphatase: 90 U/L (ref 38–126)
Anion gap: 8 (ref 5–15)
BILIRUBIN TOTAL: 0.7 mg/dL (ref 0.3–1.2)
BUN: 19 mg/dL (ref 8–23)
CO2: 24 mmol/L (ref 22–32)
Calcium: 8.5 mg/dL — ABNORMAL LOW (ref 8.9–10.3)
Chloride: 109 mmol/L (ref 98–111)
Creatinine: 1.03 mg/dL (ref 0.61–1.24)
GFR, Est AFR Am: >60 mL/min (ref >60)
Glucose, Bld: 132 mg/dL — ABNORMAL HIGH (ref 70–99)
POTASSIUM: 4.8 mmol/L (ref 3.5–5.1)
Sodium: 141 mmol/L (ref 135–145)
TOTAL PROTEIN: 6.8 g/dL (ref 6.5–8.1)

## 2017-11-14 LAB — CBC WITH DIFFERENTIAL (CANCER CENTER ONLY)
Basophils Absolute: 0 10*3/uL (ref 0.0–0.1)
Basophils Relative: 0 %
Eosinophils Absolute: 0.6 10*3/uL — ABNORMAL HIGH (ref 0.0–0.5)
Eosinophils Relative: 9 %
HCT: 30.8 % — ABNORMAL LOW (ref 38.4–49.9)
Hemoglobin: 10.1 g/dL — ABNORMAL LOW (ref 13.0–17.1)
LYMPHS PCT: 23 %
Lymphs Abs: 1.4 10*3/uL (ref 0.9–3.3)
MCH: 33.4 pg (ref 27.2–33.4)
MCHC: 32.7 g/dL (ref 32.0–36.0)
MCV: 102.1 fL — AB (ref 79.3–98.0)
MONO ABS: 0.7 10*3/uL (ref 0.1–0.9)
MONOS PCT: 11 %
NEUTROS PCT: 57 %
Neutro Abs: 3.4 10*3/uL (ref 1.5–6.5)
Platelet Count: 544 10*3/uL — ABNORMAL HIGH (ref 140–400)
RBC: 3.02 MIL/uL — ABNORMAL LOW (ref 4.20–5.82)
RDW: 27 % — AB (ref 11.0–14.6)
WBC: 6 10*3/uL (ref 4.0–10.3)

## 2017-11-14 MED ORDER — EPOETIN ALFA 40000 UNIT/ML IJ SOLN
INTRAMUSCULAR | Status: AC
  Filled 2017-11-14: qty 1

## 2017-11-14 MED ORDER — EPOETIN ALFA 40000 UNIT/ML IJ SOLN
40000.0000 [IU] | Freq: Once | INTRAMUSCULAR | Status: AC
  Administered 2017-11-14: 40000 [IU] via SUBCUTANEOUS

## 2017-11-20 ENCOUNTER — Inpatient Hospital Stay (HOSPITAL_BASED_OUTPATIENT_CLINIC_OR_DEPARTMENT_OTHER): Payer: Medicare Other | Admitting: Internal Medicine

## 2017-11-20 ENCOUNTER — Inpatient Hospital Stay: Payer: Medicare Other

## 2017-11-20 ENCOUNTER — Encounter: Payer: Self-pay | Admitting: Internal Medicine

## 2017-11-20 ENCOUNTER — Telehealth: Payer: Self-pay | Admitting: Internal Medicine

## 2017-11-20 VITALS — BP 133/75 | HR 65 | Temp 98.5°F | Resp 18 | Ht 67.0 in | Wt 161.5 lb

## 2017-11-20 DIAGNOSIS — D473 Essential (hemorrhagic) thrombocythemia: Secondary | ICD-10-CM | POA: Diagnosis not present

## 2017-11-20 DIAGNOSIS — R5383 Other fatigue: Secondary | ICD-10-CM

## 2017-11-20 DIAGNOSIS — Z79899 Other long term (current) drug therapy: Secondary | ICD-10-CM

## 2017-11-20 DIAGNOSIS — D469 Myelodysplastic syndrome, unspecified: Secondary | ICD-10-CM | POA: Diagnosis not present

## 2017-11-20 DIAGNOSIS — Z7902 Long term (current) use of antithrombotics/antiplatelets: Secondary | ICD-10-CM

## 2017-11-20 DIAGNOSIS — D6481 Anemia due to antineoplastic chemotherapy: Secondary | ICD-10-CM

## 2017-11-20 DIAGNOSIS — Z8546 Personal history of malignant neoplasm of prostate: Secondary | ICD-10-CM

## 2017-11-20 DIAGNOSIS — Z7982 Long term (current) use of aspirin: Secondary | ICD-10-CM

## 2017-11-20 DIAGNOSIS — T451X5A Adverse effect of antineoplastic and immunosuppressive drugs, initial encounter: Secondary | ICD-10-CM

## 2017-11-20 LAB — FERRITIN: FERRITIN: 1055 ng/mL — AB (ref 24–336)

## 2017-11-20 LAB — IRON AND TIBC
IRON: 65 ug/dL (ref 42–163)
Saturation Ratios: 28 % — ABNORMAL LOW (ref 42–163)
TIBC: 234 ug/dL (ref 202–409)
UIBC: 169 ug/dL

## 2017-11-20 LAB — CBC WITH DIFFERENTIAL (CANCER CENTER ONLY)
BASOS ABS: 0.2 10*3/uL — AB (ref 0.0–0.1)
BASOS PCT: 3 %
EOS ABS: 0.2 10*3/uL (ref 0.0–0.5)
Eosinophils Relative: 4 %
HCT: 33 % — ABNORMAL LOW (ref 38.4–49.9)
Hemoglobin: 10.7 g/dL — ABNORMAL LOW (ref 13.0–17.1)
Lymphocytes Relative: 26 %
Lymphs Abs: 1.5 10*3/uL (ref 0.9–3.3)
MCH: 31.8 pg (ref 27.2–33.4)
MCHC: 32.4 g/dL (ref 32.0–36.0)
MCV: 98.2 fL — ABNORMAL HIGH (ref 79.3–98.0)
Monocytes Absolute: 0.7 10*3/uL (ref 0.1–0.9)
Monocytes Relative: 12 %
Neutro Abs: 3.2 10*3/uL (ref 1.5–6.5)
Neutrophils Relative %: 55 %
Platelet Count: 553 10*3/uL — ABNORMAL HIGH (ref 140–400)
RBC: 3.36 MIL/uL — AB (ref 4.20–5.82)
RDW: 27.6 % — ABNORMAL HIGH (ref 11.0–14.6)
WBC: 5.8 10*3/uL (ref 4.0–10.3)

## 2017-11-20 LAB — CMP (CANCER CENTER ONLY)
ALK PHOS: 74 U/L (ref 38–126)
ALT: 41 U/L (ref 0–44)
AST: 26 U/L (ref 15–41)
Albumin: 3.9 g/dL (ref 3.5–5.0)
Anion gap: 8 (ref 5–15)
BUN: 18 mg/dL (ref 8–23)
CALCIUM: 8.6 mg/dL — AB (ref 8.9–10.3)
CO2: 22 mmol/L (ref 22–32)
CREATININE: 0.84 mg/dL (ref 0.61–1.24)
Chloride: 111 mmol/L (ref 98–111)
Glucose, Bld: 88 mg/dL (ref 70–99)
Potassium: 4.3 mmol/L (ref 3.5–5.1)
Sodium: 141 mmol/L (ref 135–145)
TOTAL PROTEIN: 6.7 g/dL (ref 6.5–8.1)
Total Bilirubin: 1.1 mg/dL (ref 0.3–1.2)

## 2017-11-20 MED ORDER — EPOETIN ALFA 40000 UNIT/ML IJ SOLN
INTRAMUSCULAR | Status: AC
  Filled 2017-11-20: qty 1

## 2017-11-20 MED ORDER — EPOETIN ALFA 20000 UNIT/ML IJ SOLN
40000.0000 [IU] | Freq: Once | INTRAMUSCULAR | Status: AC
  Administered 2017-11-20: 40000 [IU] via SUBCUTANEOUS

## 2017-11-20 NOTE — Progress Notes (Signed)
[place]  [phone] [phone]  OFFICE PROGRESS NOTE  [name], MD [address]  [place] [address]  DIAGNOSIS: 1.  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 Essential thrombocythemia with positive JAK2 mutation 3. Leukocytosis.   PRIOR THERAPY: Previous treatment with combination of Hydrea and anagrelide.  CURRENT THERAPY:  1) Revlimid 5 mg by mouth daily.  Started 03/25/2016.  Status post 20 cycles. 2) anagrelide 0.5 mg by mouth daily 3) Aranesp 300 mcg subcutaneously every 3 weeks.  This is switched to Procrit 400 mcg subcutaneously every 2 weeks starting April 13, 2017.  The frequency of the Procrit was changed to weekly schedule when he was in [place].  INTERVAL HISTORY: Roy Johnson 72 y.o. male returns to the clinic today for follow-up visit.  The patient is feeling fine today with no specific complaints except for the persistent fatigue.  He is currently on treatment with Revlimid 5 mg p.o. daily for 21 days every 4 weeks in addition to anagrelide 0.5 mg p.o. every other day.  He is tolerating this treatment well with no concerning complaints.  He denied having any recent chest pain, shortness of breath, cough or hemoptysis.  He denied having any fever or chills.  He has no nausea, vomiting, diarrhea or constipation.  He has no recent weight loss or night sweats.  The patient is here today for evaluation and repeat blood work.   MEDICAL HISTORY: Past Medical History:  Diagnosis Date  . Anemia   . Colon polyp   . Diverticulitis   . Diverticulosis   . GERD (gastroesophageal reflux disease)   . Hyperlipidemia   . IBS (irritable bowel syndrome)   . Inguinal hernia   . Prostate cancer ([place])                . Prostate cancer ([place])   . Umbilical hernia   . Vitamin D deficiency     ALLERGIES:  has No Known Allergies.  MEDICATIONS:  Current  Outpatient Medications  Medication Sig Dispense Refill  . amLODipine (NORVASC) 5 MG tablet Take 5 mg by mouth daily.  2  . anagrelide (AGRYLIN) 0.5 MG capsule Take 0.5 mg by mouth.    . Armodafinil 50 MG tablet Take by mouth.    Marland Kitchen aspirin EC 81 MG tablet Take 81 mg by mouth.    . Darbepoetin Alfa (ARANESP) 100 MCG/0.5ML SOSY injection Inject into the skin.    Marland Kitchen epoetin alfa-epbx (RETACRIT) 38882 UNIT/ML injection Inject into the skin.    Marland Kitchen gabapentin (NEURONTIN) 100 MG capsule One capsule twice a day, 2 at night 120 capsule 3  . lenalidomide (REVLIMID) 5 MG capsule Take by mouth.    . modafinil (PROVIGIL) 100 MG tablet Take by mouth.    . Multiple Vitamin (MULTI-VITAMINS) TABS Take 1 tablet by mouth daily.    . Polyethylene Glycol 3350 (PEG 3350) POWD nightly.    . vitamin B-12 (CYANOCOBALAMIN) 1000 MCG tablet Take 1 tablet by mouth daily as needed.     No current facility-administered medications for this visit.    Facility-Administered Medications Ordered in Other Visits  Medication Dose Route Frequency Provider Last Rate Last Dose  . 0.9 %  sodium chloride infusion  250 mL Intravenous Once Curt Bears, MD        SURGICAL HISTORY:  Past Surgical History:  Procedure Laterality Date  . COLONOSCOPY    . POLYPECTOMY    .  PROSTATECTOMY    . TONSILLECTOMY      REVIEW OF SYSTEMS:  A comprehensive review of systems was negative except for: Constitutional: positive for fatigue   PHYSICAL EXAMINATION: General appearance: alert, cooperative, appears stated age, fatigued and no distress Head: Normocephalic, without obvious abnormality, atraumatic Neck: no adenopathy, no JVD, supple, symmetrical, trachea midline and thyroid not enlarged, symmetric, no tenderness/mass/nodules Lymph nodes: Cervical, supraclavicular, and axillary nodes normal. Resp: clear to auscultation bilaterally Back: symmetric, no curvature. ROM normal. No CVA tenderness. Cardio: regular rate and rhythm, S1, S2  normal, no murmur, click, rub or gallop GI: soft, non-tender; bowel sounds normal; no masses,  no organomegaly Extremities: extremities normal, atraumatic, no cyanosis or edema  ECOG PERFORMANCE STATUS: 1 - Symptomatic but completely ambulatory  Blood pressure 133/75, pulse 65, temperature 98.5 F (36.9 C), temperature source Oral, resp. rate 18, height 5\' 7"  (1.702 m), weight 161 lb 8 oz (73.3 kg), SpO2 100 %.  LABORATORY DATA: Lab Results  Component Value Date   WBC 5.8 11/20/2017   HGB 10.7 (L) 11/20/2017   HCT 33.0 (L) 11/20/2017   MCV 98.2 (H) 11/20/2017   PLT 553 (H) 11/20/2017      Chemistry      Component Value Date/Time   NA 141 11/14/2017 0900   NA 141 12/08/2016 0928   K 4.8 11/14/2017 0900   K 4.5 12/08/2016 0928   CL 109 11/14/2017 0900   CO2 24 11/14/2017 0900   CO2 27 12/08/2016 0928   BUN 19 11/14/2017 0900   BUN 14.1 12/08/2016 0928   CREATININE 1.03 11/14/2017 0900   CREATININE 0.9 12/08/2016 0928      Component Value Date/Time   CALCIUM 8.5 (L) 11/14/2017 0900   CALCIUM 9.0 12/08/2016 0928   ALKPHOS 90 11/14/2017 0900   ALKPHOS 87 12/08/2016 0928   AST 21 11/14/2017 0900   AST 25 12/08/2016 0928   ALT 41 11/14/2017 0900   ALT 41 12/08/2016 0928   BILITOT 0.7 11/14/2017 0900   BILITOT 0.84 12/08/2016 0928      RADIOGRAPHIC STUDIES:  ASSESSMENT AND PLAN:  This is a very pleasant 72 years old white male with essential thrombocythemia with positive JAK-2 mutation as well as myelodysplastic syndrome. He is currently on treatment with Revlimid 5 mg by mouth daily according to the recommendation from [facility] [place] [facility].  He is also on treatment with Aranesp every 3 weeks.  Aranesp was a change it to Procrit initially every 2 weeks and currently on weekly basis.  He mentioned that he is feeling much better on Procrit weekly and his hemoglobin has always been above 10.0 g/dL.  The patient continues to tolerate this treatment well. CBC today is unremarkable  except for the mild anemia and mildly elevated platelets count. I recommended for him to continue his current treatment with Revlimid as well as anagrelide and Procrit. He will come back for follow-up visit in 4 months for evaluation with repeat blood work. For the fatigue, I advised him to discuss with his primary care physician and neurologist consideration of treatment with Ritalin. He was advised to call immediately if he has any concerning symptoms in the interval. All questions were answered. The patient knows to call the clinic with any problems, questions or concerns. We can certainly see the patient much sooner if necessary. I spent 10 minutes counseling the patient face to face. The total time spent in the appointment was 15 minutes.  Disclaimer: This note was dictated with voice  recognition software. Similar sounding words can inadvertently be transcribed and may not be corrected upon review.

## 2017-11-20 NOTE — Patient Instructions (Signed)
Epoetin Alfa injection °What is this medicine? °EPOETIN ALFA (e POE e tin AL fa) helps your body make more red blood cells. This medicine is used to treat anemia caused by chronic kidney failure, cancer chemotherapy, or HIV-therapy. It may also be used before surgery if you have anemia. °This medicine may be used for other purposes; ask your health care provider or pharmacist if you have questions. °COMMON BRAND NAME(S): Epogen, Procrit °What should I tell my health care provider before I take this medicine? °They need to know if you have any of these conditions: °-blood clotting disorders °-cancer patient not on chemotherapy °-cystic fibrosis °-heart disease, such as angina or heart failure °-hemoglobin level of 12 g/dL or greater °-high blood pressure °-low levels of folate, iron, or vitamin B12 °-seizures °-an unusual or allergic reaction to erythropoietin, albumin, benzyl alcohol, hamster proteins, other medicines, foods, dyes, or preservatives °-pregnant or trying to get pregnant °-breast-feeding °How should I use this medicine? °This medicine is for injection into a vein or under the skin. It is usually given by a health care professional in a hospital or clinic setting. °If you get this medicine at home, you will be taught how to prepare and give this medicine. Use exactly as directed. Take your medicine at regular intervals. Do not take your medicine more often than directed. °It is important that you put your used needles and syringes in a special sharps container. Do not put them in a trash can. If you do not have a sharps container, call your pharmacist or healthcare provider to get one. °A special MedGuide will be given to you by the pharmacist with each prescription and refill. Be sure to read this information carefully each time. °Talk to your pediatrician regarding the use of this medicine in children. While this drug may be prescribed for selected conditions, precautions do apply. °Overdosage: If you  think you have taken too much of this medicine contact a poison control center or emergency room at once. °NOTE: This medicine is only for you. Do not share this medicine with others. °What if I miss a dose? °If you miss a dose, take it as soon as you can. If it is almost time for your next dose, take only that dose. Do not take double or extra doses. °What may interact with this medicine? °Do not take this medicine with any of the following medications: °-darbepoetin alfa °This list may not describe all possible interactions. Give your health care provider a list of all the medicines, herbs, non-prescription drugs, or dietary supplements you use. Also tell them if you smoke, drink alcohol, or use illegal drugs. Some items may interact with your medicine. °What should I watch for while using this medicine? °Your condition will be monitored carefully while you are receiving this medicine. °You may need blood work done while you are taking this medicine. °What side effects may I notice from receiving this medicine? °Side effects that you should report to your doctor or health care professional as soon as possible: °-allergic reactions like skin rash, itching or hives, swelling of the face, lips, or tongue °-breathing problems °-changes in vision °-chest pain °-confusion, trouble speaking or understanding °-feeling faint or lightheaded, falls °-high blood pressure °-muscle aches or pains °-pain, swelling, warmth in the leg °-rapid weight gain °-severe headaches °-sudden numbness or weakness of the face, arm or leg °-trouble walking, dizziness, loss of balance or coordination °-seizures (convulsions) °-swelling of the ankles, feet, hands °-unusually weak or tired °  Side effects that usually do not require medical attention (report to your doctor or health care professional if they continue or are bothersome): °-diarrhea °-fever, chills (flu-like symptoms) °-headaches °-nausea, vomiting °-redness, stinging, or swelling at  site where injected °This list may not describe all possible side effects. Call your doctor for medical advice about side effects. You may report side effects to FDA at 1-800-FDA-1088. °Where should I keep my medicine? °Keep out of the reach of children. °Store in a refrigerator between 2 and 8 degrees C (36 and 46 degrees F). Do not freeze or shake. Throw away any unused portion if using a single-dose vial. Multi-dose vials can be kept in the refrigerator for up to 21 days after the initial dose. Throw away unused medicine. °NOTE: This sheet is a summary. It may not cover all possible information. If you have questions about this medicine, talk to your doctor, pharmacist, or health care provider. °© 2018 [employer] (2015-11-16 19:42:31) ° °

## 2017-11-20 NOTE — Telephone Encounter (Signed)
Appts scheduled AVS/Calendar declined per 8/12 los

## 2017-11-20 NOTE — Telephone Encounter (Signed)
Appointments scheduled AVS/Calendar printed per 8/12 los °

## 2017-11-24 ENCOUNTER — Other Ambulatory Visit: Payer: Self-pay | Admitting: *Deleted

## 2017-11-24 DIAGNOSIS — D469 Myelodysplastic syndrome, unspecified: Secondary | ICD-10-CM

## 2017-11-27 ENCOUNTER — Inpatient Hospital Stay: Payer: Medicare Other

## 2017-11-27 MED ORDER — EPOETIN ALFA 40000 UNIT/ML IJ SOLN
INTRAMUSCULAR | Status: AC
  Filled 2017-11-27: qty 1

## 2017-11-28 ENCOUNTER — Inpatient Hospital Stay: Payer: Medicare Other

## 2017-11-28 DIAGNOSIS — D469 Myelodysplastic syndrome, unspecified: Secondary | ICD-10-CM

## 2017-11-28 LAB — CMP (CANCER CENTER ONLY)
ALK PHOS: 74 U/L (ref 38–126)
ALT: 43 U/L (ref 0–44)
AST: 19 U/L (ref 15–41)
Albumin: 4.1 g/dL (ref 3.5–5.0)
Anion gap: 6 (ref 5–15)
BUN: 15 mg/dL (ref 8–23)
CHLORIDE: 106 mmol/L (ref 98–111)
CO2: 28 mmol/L (ref 22–32)
CREATININE: 0.85 mg/dL (ref 0.61–1.24)
Calcium: 8.9 mg/dL (ref 8.9–10.3)
GFR, Est AFR Am: >60 mL/min (ref >60)
Glucose, Bld: 95 mg/dL (ref 70–99)
Potassium: 4.4 mmol/L (ref 3.5–5.1)
Sodium: 140 mmol/L (ref 135–145)
Total Bilirubin: 1.3 mg/dL — ABNORMAL HIGH (ref 0.3–1.2)
Total Protein: 7.1 g/dL (ref 6.5–8.1)

## 2017-11-28 LAB — CBC WITH DIFFERENTIAL (CANCER CENTER ONLY)
Basophils Absolute: 0.2 10*3/uL — ABNORMAL HIGH (ref 0.0–0.1)
Basophils Relative: 3 %
EOS ABS: 0.5 10*3/uL (ref 0.0–0.5)
EOS PCT: 10 %
HCT: 37.2 % — ABNORMAL LOW (ref 38.4–49.9)
Hemoglobin: 12 g/dL — ABNORMAL LOW (ref 13.0–17.1)
LYMPHS ABS: 1 10*3/uL (ref 0.9–3.3)
Lymphocytes Relative: 20 %
MCH: 33.1 pg (ref 27.2–33.4)
MCHC: 32.3 g/dL (ref 32.0–36.0)
MCV: 102.5 fL — ABNORMAL HIGH (ref 79.3–98.0)
Monocytes Absolute: 0.4 10*3/uL (ref 0.1–0.9)
Monocytes Relative: 9 %
Neutro Abs: 3 10*3/uL (ref 1.5–6.5)
Neutrophils Relative %: 58 %
PLATELETS: 572 10*3/uL — AB (ref 140–400)
RBC: 3.63 MIL/uL — AB (ref 4.20–5.82)
RDW: 27.9 % — ABNORMAL HIGH (ref 11.0–14.6)
WBC: 5.1 10*3/uL (ref 4.0–10.3)

## 2017-12-01 ENCOUNTER — Other Ambulatory Visit: Payer: Self-pay | Admitting: *Deleted

## 2017-12-01 DIAGNOSIS — D469 Myelodysplastic syndrome, unspecified: Secondary | ICD-10-CM

## 2017-12-04 ENCOUNTER — Inpatient Hospital Stay: Payer: Medicare Other

## 2017-12-04 VITALS — BP 140/68 | HR 67 | Resp 18

## 2017-12-04 DIAGNOSIS — D469 Myelodysplastic syndrome, unspecified: Secondary | ICD-10-CM

## 2017-12-04 LAB — CMP (CANCER CENTER ONLY)
ALBUMIN: 4 g/dL (ref 3.5–5.0)
ALT: 45 U/L — ABNORMAL HIGH (ref 0–44)
ANION GAP: 4 — AB (ref 5–15)
AST: 20 U/L (ref 15–41)
Alkaline Phosphatase: 71 U/L (ref 38–126)
BILIRUBIN TOTAL: 1.1 mg/dL (ref 0.3–1.2)
BUN: 22 mg/dL (ref 8–23)
CO2: 29 mmol/L (ref 22–32)
Calcium: 9.4 mg/dL (ref 8.9–10.3)
Chloride: 108 mmol/L (ref 98–111)
Creatinine: 0.79 mg/dL (ref 0.61–1.24)
GFR, Est AFR Am: >60 mL/min (ref >60)
GFR, Estimated: >60 mL/min (ref >60)
GLUCOSE: 95 mg/dL (ref 70–99)
POTASSIUM: 5.1 mmol/L (ref 3.5–5.1)
Sodium: 141 mmol/L (ref 135–145)
TOTAL PROTEIN: 6.9 g/dL (ref 6.5–8.1)

## 2017-12-04 LAB — CBC WITH DIFFERENTIAL (CANCER CENTER ONLY)
BASOS ABS: 0.1 10*3/uL (ref 0.0–0.1)
Basophils Relative: 2 %
Eosinophils Absolute: 0.7 10*3/uL — ABNORMAL HIGH (ref 0.0–0.5)
Eosinophils Relative: 11 %
HCT: 33.5 % — ABNORMAL LOW (ref 38.4–49.9)
Hemoglobin: 10.6 g/dL — ABNORMAL LOW (ref 13.0–17.1)
LYMPHS PCT: 24 %
Lymphs Abs: 1.5 10*3/uL (ref 0.9–3.3)
MCH: 31.4 pg (ref 27.2–33.4)
MCHC: 31.6 g/dL — ABNORMAL LOW (ref 32.0–36.0)
MCV: 99.1 fL — AB (ref 79.3–98.0)
Monocytes Absolute: 0.8 10*3/uL (ref 0.1–0.9)
Monocytes Relative: 13 %
NEUTROS ABS: 3.1 10*3/uL (ref 1.5–6.5)
Neutrophils Relative %: 50 %
Platelet Count: 559 10*3/uL — ABNORMAL HIGH (ref 140–400)
RBC: 3.38 MIL/uL — AB (ref 4.20–5.82)
RDW: 28.3 % — ABNORMAL HIGH (ref 11.0–14.6)
WBC Count: 6.2 10*3/uL (ref 4.0–10.3)

## 2017-12-04 MED ORDER — EPOETIN ALFA 40000 UNIT/ML IJ SOLN
INTRAMUSCULAR | Status: AC
  Filled 2017-12-04: qty 1

## 2017-12-04 MED ORDER — EPOETIN ALFA 20000 UNIT/ML IJ SOLN
40000.0000 [IU] | Freq: Once | INTRAMUSCULAR | Status: AC
  Administered 2017-12-04: 40000 [IU] via SUBCUTANEOUS

## 2017-12-05 ENCOUNTER — Ambulatory Visit: Payer: Medicare Other | Admitting: Neurology

## 2017-12-05 ENCOUNTER — Encounter: Payer: Self-pay | Admitting: Neurology

## 2017-12-05 VITALS — BP 138/81 | HR 71 | Ht 68.0 in | Wt 163.0 lb

## 2017-12-05 DIAGNOSIS — R5383 Other fatigue: Secondary | ICD-10-CM | POA: Diagnosis not present

## 2017-12-05 DIAGNOSIS — R0683 Snoring: Secondary | ICD-10-CM | POA: Diagnosis not present

## 2017-12-05 DIAGNOSIS — R419 Unspecified symptoms and signs involving cognitive functions and awareness: Secondary | ICD-10-CM

## 2017-12-05 NOTE — Patient Instructions (Signed)
As discussed, your history and physical exam do not suggest that you have a high risk for obstructive sleep apnea. Nevertheless, I would be happy to order a sleep study to rule out obstructive sleep apnea as an underlying possible cause of your lack of energy. Please think about it and call us back if you decide to proceed with sleep study testing.

## 2017-12-05 NOTE — Progress Notes (Signed)
Subjective:    Patient ID: Roy Johnson is a 72 y.o. male.  HPI     Star Age, MD, PhD [facility] [address], [address] [place], [place] [address]  Dear Lanny Hurst,   I saw your patient, Roy Johnson, upon your kind request in my clinic today for initial consultation of his sleep disorder, in particular, concern for underlying obstructive sleep apnea. The patient is unaccompanied today. As you know, Roy Johnson is a 72 year old right-handed gentleman with an underlying medical history of anemia, myelodysplastic syndrome, diverticulosis, reflux disease, IBS, hyperlipidemia, prostate cancer, memory loss and mildly overweight state, who reports snoring and frequent nighttime urination, as well as nonrestorative sleep at times. I reviewed your office note from 09/28/2017. His Epworth sleepiness score is 7 out of 24, fatigue score is 36 out of 63. His main complaint regarding his sleep as lack of energy. He snores typically only when he sleeps on his back. Lately, he had to sleep more on his back because of low back strain. His wife sleeps in a separate bedroom currently as she broke her hip. He does not wake up gasping for air, he had restless leg symptoms in the past but these are currently not a problem. His concern is that his underlying myelodysplastic disease and/or medications for this may be causing his foggy headedness his fuzzy feeling in the head and difficulty with concentration and lack of energy. He does not typically take a nap but if he does he may sleep for 3 hours. His bedtime is generally between 11:30 and midnight. In fact, as far as his energy level goes he feels better in the later part of the day and evening. His rise time is anywhere between 8:30 AM and 10 AM. He has nocturia about twice per average night. He does not have any family history of obstructive sleep apnea. He quit smoking some 40 years ago and has reduced his alcohol consumption, typically  once or twice per week currently. He drinks caffeine in the form of coffee, usually one cup in the morning only. He had a tonsillectomy as an infant.    His Past Medical History Is Significant For: Past Medical History:  Diagnosis Date  . Anemia   . Colon polyp   . Diverticulitis   . Diverticulosis   . GERD (gastroesophageal reflux disease)   . Hyperlipidemia   . IBS (irritable bowel syndrome)   . Inguinal hernia   . Prostate cancer ([place])                . Prostate cancer ([place])   . Umbilical hernia   . Vitamin D deficiency     His Past Surgical History Is Significant For: Past Surgical History:  Procedure Laterality Date  . COLONOSCOPY    . POLYPECTOMY    . PROSTATECTOMY    . TONSILLECTOMY      His Family History Is Significant For: Family History  Problem Relation Age of Onset  . Heart disease Father   . Colon polyps Father   . Lung cancer Father        cancerous mole  . Prostate cancer Father   . Osteoarthritis Mother   . Breast cancer Mother   . Colon cancer Neg Hx   . Esophageal cancer Neg Hx   . Rectal cancer Neg Hx   . Stomach cancer Neg Hx     His Social History Is Significant For: Social History   Socioeconomic History  .  Marital status: Married    Spouse name: Not on file  . Number of children: 2  . Years of education: Not on file  . Highest education level: Not on file  Occupational History  . Occupation: retired  Scientific laboratory technician  . Financial resource strain: Not on file  . Food insecurity:    Worry: Not on file    Inability: Not on file  . Transportation needs:    Medical: Not on file    Non-medical: Not on file  Tobacco Use  . Smoking status: Former Smoker    Last attempt to quit: 09/07/1973    Years since quitting: 44.2  . Smokeless tobacco: Never Used  Substance and Sexual Activity  . Alcohol use: Yes    Alcohol/week: 7.0 standard drinks    Types: 7 Standard drinks or equivalent per week  . Drug use: No  . Sexual activity: Not on file   Lifestyle  . Physical activity:    Days per week: Not on file    Minutes per session: Not on file  . Stress: Not on file  Relationships  . Social connections:    Talks on phone: Not on file    Gets together: Not on file    Attends religious service: Not on file    Active member of club or organization: Not on file    Attends meetings of clubs or organizations: Not on file    Relationship status: Not on file  Other Topics Concern  . Not on file  Social History Narrative  . Not on file    His Allergies Are:  No Known Allergies:   His Current Medications Are:  Outpatient Encounter Medications as of 12/05/2017  Medication Sig  . amLODipine (NORVASC) 5 MG tablet Take 5 mg by mouth daily.  Marland Kitchen anagrelide (AGRYLIN) 0.5 MG capsule Take 0.5 mg by mouth.  Marland Kitchen aspirin EC 81 MG tablet Take 81 mg by mouth.  . Darbepoetin Alfa (ARANESP) 100 MCG/0.5ML SOSY injection Inject into the skin.  Marland Kitchen epoetin alfa-epbx (RETACRIT) 63875 UNIT/ML injection Inject into the skin.  Marland Kitchen gabapentin (NEURONTIN) 100 MG capsule One capsule twice a day, 2 at night  . lenalidomide (REVLIMID) 5 MG capsule Take 5 mg by mouth daily.  . Polyethylene Glycol 3350 (PEG 3350) POWD nightly.  . vitamin B-12 (CYANOCOBALAMIN) 1000 MCG tablet Take 1 tablet by mouth daily as needed.  . Armodafinil 50 MG tablet Take by mouth.  . Multiple Vitamin (MULTI-VITAMINS) TABS Take 1 tablet by mouth daily.  . [DISCONTINUED] modafinil (PROVIGIL) 100 MG tablet Take by mouth.   Facility-Administered Encounter Medications as of 12/05/2017  Medication  . 0.9 %  sodium chloride infusion  :  Review of Systems:  Out of a complete 14 point review of systems, all are reviewed and negative with the exception of these symptoms as listed below:  Review of Systems  Neurological:       Pt presents today to discuss his sleep. Pt has never had a sleep study. Pt does snore when he sleeps on his back.  Epworth Sleepiness Scale 0= would never doze 1=  slight chance of dozing 2= moderate chance of dozing 3= high chance of dozing  Sitting and reading: 2 Watching TV: 1 Sitting inactive in a public place (ex. Theater or meeting): 0 As a passenger in a car for an hour without a break: 1 Lying down to rest in the afternoon: 2 Sitting and talking to someone: 0 Sitting quietly after lunch (  no alcohol): 1 In a car, while stopped in traffic: 0 Total: 7     Objective:  Neurological Exam  Physical Exam Physical Examination:   Vitals:   12/05/17 1116  BP: 138/81  Pulse: 71   General Examination: The patient is a very pleasant 72 y.o. male in no acute distress. He appears well-developed and well-nourished and well groomed.   HEENT: Normocephalic, atraumatic, pupils are equal, round and reactive to light and accommodation. Corrective eye glasses in place. Hearing mildly impaired. Extraocular tracking is good without limitation to gaze excursion or nystagmus noted. Normal smooth pursuit is noted. Face is symmetric with normal facial animation and normal facial sensation. Speech is clear with no dysarthria noted. There is no hypophonia. There is no lip, neck/head, jaw or voice tremor. Neck is supple with full range of passive and active motion. There are no carotid bruits on auscultation. Oropharynx exam reveals: mild mouth dryness, adequate dental hygiene and no significant airway crowding. Mallampati is class I. Tongue protrudes centrally and palate elevates symmetrically. Tonsils are absent. Neck size is 15 1/8 inches. He has a Mild overbite.   Chest: Clear to auscultation without wheezing, rhonchi or crackles noted.  Heart: S1+S2+0, regular and normal without murmurs, rubs or gallops noted.   Abdomen: Soft, non-tender and non-distended with normal bowel sounds appreciated on auscultation.  Extremities: There is trace pitting edema in the distal lower extremities bilaterally. Pedal pulses are intact.  Skin: Warm and dry without trophic  changes noted.  Musculoskeletal: exam reveals no obvious joint deformities, tenderness or joint swelling or erythema.   Neurologically:  Mental status: The patient is awake, alert and oriented in all 4 spheres. His immediate and remote memory, attention, language skills and fund of knowledge are appropriate. There is no evidence of aphasia, agnosia, apraxia or anomia. Speech is clear with normal prosody and enunciation. Thought process is linear. Mood is normal and affect is normal.  Cranial nerves II - XII are as described above under HEENT exam. In addition: shoulder shrug is normal with equal shoulder height noted. Motor exam: Normal bulk, strength and tone is noted. There is no drift, tremor or rebound. Romberg is negative. Fine motor skills and coordination: intact with normal finger taps, normal hand movements, normal rapid alternating patting, normal foot taps and normal foot agility.  Cerebellar testing: No dysmetria or intention tremor on finger to nose testing. Heel to shin is unremarkable bilaterally. There is no truncal or gait ataxia.  Sensory exam: intact to light touch in the upper and lower extremities.  Gait, station and balance: He stands easily. No veering to one side is noted. No leaning to one side is noted. Posture is age-appropriate and stance is narrow based. Gait shows normal stride length and normal pace. No problems turning are noted.   Assessment and Plan:   In summary, Roy Johnson is a very pleasant 72 y.o.-year old male with an underlying medical history of anemia, myelodysplastic syndrome, diverticulosis, reflux disease, IBS, hyperlipidemia, prostate cancer, memory loss and mildly overweight state, who presents for evaluation of his sleep disturbance. His history and exam are not suggesting high risk for underlying obstructive sleep apnea. He reports nocturia and sleep disruption to some degree, some snoring reported especially if he sleeps on his back. He is advised  that we can proceed with a sleep study primarily to rule out underlying obstructive sleep apnea as a possible cause for his daytime symptoms of lack of energy. In addition, he has vague  cognitive complaints and also symptoms of feeling foggy headed. He had a brain MRI not too long ago which was benign. I had a long chat with the patient about my findings and the diagnosis of OSA, its prognosis and treatment options. We talked about medical treatments, surgical interventions and non-pharmacological approaches. He would like to think about doing a sleep study. In the interim, he will talk to his wife again about her observations about his sleep and he has an appointment coming up with you as well. He is encouraged to call our office when he is ready to proceed with a sleep study. I answered all his questions today and the patient was in agreement.  Thank you very much for allowing me to participate in the care of this nice patient. If I can be of any further assistance to you please do not hesitate to talk to me.   Sincerely,   Star Age, MD, PhD

## 2017-12-08 ENCOUNTER — Other Ambulatory Visit: Payer: Self-pay | Admitting: *Deleted

## 2017-12-08 DIAGNOSIS — D469 Myelodysplastic syndrome, unspecified: Secondary | ICD-10-CM

## 2017-12-12 ENCOUNTER — Inpatient Hospital Stay: Payer: Medicare Other | Attending: Internal Medicine

## 2017-12-12 ENCOUNTER — Inpatient Hospital Stay: Payer: Medicare Other

## 2017-12-12 VITALS — BP 134/81 | HR 64 | Temp 98.1°F | Resp 18

## 2017-12-12 DIAGNOSIS — D469 Myelodysplastic syndrome, unspecified: Secondary | ICD-10-CM | POA: Diagnosis not present

## 2017-12-12 LAB — CBC WITH DIFFERENTIAL (CANCER CENTER ONLY)
BASOS ABS: 0.1 10*3/uL (ref 0.0–0.1)
Basophils Relative: 2 %
Eosinophils Absolute: 0.7 10*3/uL — ABNORMAL HIGH (ref 0.0–0.5)
Eosinophils Relative: 11 %
HCT: 30.6 % — ABNORMAL LOW (ref 38.4–49.9)
Hemoglobin: 10.1 g/dL — ABNORMAL LOW (ref 13.0–17.1)
LYMPHS PCT: 25 %
Lymphs Abs: 1.6 10*3/uL (ref 0.9–3.3)
MCH: 31.9 pg (ref 27.2–33.4)
MCHC: 33 g/dL (ref 32.0–36.0)
MCV: 96.5 fL (ref 79.3–98.0)
MONO ABS: 0.8 10*3/uL (ref 0.1–0.9)
Monocytes Relative: 12 %
Neutro Abs: 3.2 10*3/uL (ref 1.5–6.5)
Neutrophils Relative %: 50 %
Platelet Count: 621 10*3/uL — ABNORMAL HIGH (ref 140–400)
RBC: 3.17 MIL/uL — ABNORMAL LOW (ref 4.20–5.82)
RDW: 28 % — ABNORMAL HIGH (ref 11.0–14.6)
WBC Count: 6.4 10*3/uL (ref 4.0–10.3)

## 2017-12-12 LAB — CMP (CANCER CENTER ONLY)
ALT: 66 U/L — AB (ref 0–44)
AST: 27 U/L (ref 15–41)
Albumin: 3.8 g/dL (ref 3.5–5.0)
Alkaline Phosphatase: 74 U/L (ref 38–126)
Anion gap: 6 (ref 5–15)
BUN: 16 mg/dL (ref 8–23)
CHLORIDE: 106 mmol/L (ref 98–111)
CO2: 29 mmol/L (ref 22–32)
Calcium: 9.3 mg/dL (ref 8.9–10.3)
Creatinine: 0.84 mg/dL (ref 0.61–1.24)
GFR, Est AFR Am: >60 mL/min (ref >60)
GFR, Estimated: >60 mL/min (ref >60)
GLUCOSE: 107 mg/dL — AB (ref 70–99)
POTASSIUM: 4.7 mmol/L (ref 3.5–5.1)
SODIUM: 141 mmol/L (ref 135–145)
TOTAL PROTEIN: 6.5 g/dL (ref 6.5–8.1)
Total Bilirubin: 0.8 mg/dL (ref 0.3–1.2)

## 2017-12-12 MED ORDER — EPOETIN ALFA 40000 UNIT/ML IJ SOLN
40000.0000 [IU] | Freq: Once | INTRAMUSCULAR | Status: AC
  Administered 2017-12-12: 40000 [IU] via SUBCUTANEOUS

## 2017-12-12 MED ORDER — EPOETIN ALFA 40000 UNIT/ML IJ SOLN
INTRAMUSCULAR | Status: AC
  Filled 2017-12-12: qty 1

## 2017-12-12 NOTE — Patient Instructions (Signed)
Epoetin Alfa injection °What is this medicine? °EPOETIN ALFA (e POE e tin AL fa) helps your body make more red blood cells. This medicine is used to treat anemia caused by chronic kidney failure, cancer chemotherapy, or HIV-therapy. It may also be used before surgery if you have anemia. °This medicine may be used for other purposes; ask your health care provider or pharmacist if you have questions. °COMMON BRAND NAME(S): Epogen, Procrit °What should I tell my health care provider before I take this medicine? °They need to know if you have any of these conditions: °-blood clotting disorders °-cancer patient not on chemotherapy °-cystic fibrosis °-heart disease, such as angina or heart failure °-hemoglobin level of 12 g/dL or greater °-high blood pressure °-low levels of folate, iron, or vitamin B12 °-seizures °-an unusual or allergic reaction to erythropoietin, albumin, benzyl alcohol, hamster proteins, other medicines, foods, dyes, or preservatives °-pregnant or trying to get pregnant °-breast-feeding °How should I use this medicine? °This medicine is for injection into a vein or under the skin. It is usually given by a health care professional in a hospital or clinic setting. °If you get this medicine at home, you will be taught how to prepare and give this medicine. Use exactly as directed. Take your medicine at regular intervals. Do not take your medicine more often than directed. °It is important that you put your used needles and syringes in a special sharps container. Do not put them in a trash can. If you do not have a sharps container, call your pharmacist or healthcare provider to get one. °A special MedGuide will be given to you by the pharmacist with each prescription and refill. Be sure to read this information carefully each time. °Talk to your pediatrician regarding the use of this medicine in children. While this drug may be prescribed for selected conditions, precautions do apply. °Overdosage: If you  think you have taken too much of this medicine contact a poison control center or emergency room at once. °NOTE: This medicine is only for you. Do not share this medicine with others. °What if I miss a dose? °If you miss a dose, take it as soon as you can. If it is almost time for your next dose, take only that dose. Do not take double or extra doses. °What may interact with this medicine? °Do not take this medicine with any of the following medications: °-darbepoetin alfa °This list may not describe all possible interactions. Give your health care provider a list of all the medicines, herbs, non-prescription drugs, or dietary supplements you use. Also tell them if you smoke, drink alcohol, or use illegal drugs. Some items may interact with your medicine. °What should I watch for while using this medicine? °Your condition will be monitored carefully while you are receiving this medicine. °You may need blood work done while you are taking this medicine. °What side effects may I notice from receiving this medicine? °Side effects that you should report to your doctor or health care professional as soon as possible: °-allergic reactions like skin rash, itching or hives, swelling of the face, lips, or tongue °-breathing problems °-changes in vision °-chest pain °-confusion, trouble speaking or understanding °-feeling faint or lightheaded, falls °-high blood pressure °-muscle aches or pains °-pain, swelling, warmth in the leg °-rapid weight gain °-severe headaches °-sudden numbness or weakness of the face, arm or leg °-trouble walking, dizziness, loss of balance or coordination °-seizures (convulsions) °-swelling of the ankles, feet, hands °-unusually weak or tired °  Side effects that usually do not require medical attention (report to your doctor or health care professional if they continue or are bothersome): °-diarrhea °-fever, chills (flu-like symptoms) °-headaches °-nausea, vomiting °-redness, stinging, or swelling at  site where injected °This list may not describe all possible side effects. Call your doctor for medical advice about side effects. You may report side effects to FDA at 1-800-FDA-1088. °Where should I keep my medicine? °Keep out of the reach of children. °Store in a refrigerator between 2 and 8 degrees C (36 and 46 degrees F). Do not freeze or shake. Throw away any unused portion if using a single-dose vial. Multi-dose vials can be kept in the refrigerator for up to 21 days after the initial dose. Throw away unused medicine. °NOTE: This sheet is a summary. It may not cover all possible information. If you have questions about this medicine, talk to your doctor, pharmacist, or health care provider. °© 2018 [employer] (2015-11-16 19:42:31) ° °

## 2017-12-12 NOTE — Progress Notes (Signed)
Pt. Tolerated injection well, No further problem or concern noted.

## 2017-12-18 ENCOUNTER — Inpatient Hospital Stay: Payer: Medicare Other

## 2017-12-18 ENCOUNTER — Other Ambulatory Visit: Payer: Self-pay | Admitting: *Deleted

## 2017-12-18 VITALS — BP 140/79 | HR 67 | Temp 97.8°F | Resp 20

## 2017-12-18 DIAGNOSIS — D469 Myelodysplastic syndrome, unspecified: Secondary | ICD-10-CM

## 2017-12-18 LAB — CBC WITH DIFFERENTIAL (CANCER CENTER ONLY)
BASOS ABS: 0.3 10*3/uL — AB (ref 0.0–0.1)
Basophils Relative: 5 %
EOS PCT: 5 %
Eosinophils Absolute: 0.3 10*3/uL (ref 0.0–0.5)
HCT: 32.8 % — ABNORMAL LOW (ref 38.4–49.9)
Hemoglobin: 10.8 g/dL — ABNORMAL LOW (ref 13.0–17.1)
LYMPHS ABS: 1.3 10*3/uL (ref 0.9–3.3)
LYMPHS PCT: 22 %
MCH: 33.1 pg (ref 27.2–33.4)
MCHC: 32.9 g/dL (ref 32.0–36.0)
MCV: 100.5 fL — ABNORMAL HIGH (ref 79.3–98.0)
MONOS PCT: 9 %
Monocytes Absolute: 0.5 10*3/uL (ref 0.1–0.9)
Neutro Abs: 3.5 10*3/uL (ref 1.5–6.5)
Neutrophils Relative %: 59 %
PLATELETS: 619 10*3/uL — AB (ref 140–400)
RBC: 3.26 MIL/uL — ABNORMAL LOW (ref 4.20–5.82)
RDW: 27.7 % — AB (ref 11.0–14.6)
WBC: 5.8 10*3/uL (ref 4.0–10.3)

## 2017-12-18 MED ORDER — EPOETIN ALFA 40000 UNIT/ML IJ SOLN
40000.0000 [IU] | Freq: Once | INTRAMUSCULAR | Status: AC
  Administered 2017-12-18: 40000 [IU] via SUBCUTANEOUS

## 2017-12-18 MED ORDER — EPOETIN ALFA 40000 UNIT/ML IJ SOLN
INTRAMUSCULAR | Status: AC
  Filled 2017-12-18: qty 1

## 2017-12-18 NOTE — Patient Instructions (Signed)
Epoetin Alfa injection °What is this medicine? °EPOETIN ALFA (e POE e tin AL fa) helps your body make more red blood cells. This medicine is used to treat anemia caused by chronic kidney failure, cancer chemotherapy, or HIV-therapy. It may also be used before surgery if you have anemia. °This medicine may be used for other purposes; ask your health care provider or pharmacist if you have questions. °COMMON BRAND NAME(S): Epogen, Procrit °What should I tell my health care provider before I take this medicine? °They need to know if you have any of these conditions: °-blood clotting disorders °-cancer patient not on chemotherapy °-cystic fibrosis °-heart disease, such as angina or heart failure °-hemoglobin level of 12 g/dL or greater °-high blood pressure °-low levels of folate, iron, or vitamin B12 °-seizures °-an unusual or allergic reaction to erythropoietin, albumin, benzyl alcohol, hamster proteins, other medicines, foods, dyes, or preservatives °-pregnant or trying to get pregnant °-breast-feeding °How should I use this medicine? °This medicine is for injection into a vein or under the skin. It is usually given by a health care professional in a hospital or clinic setting. °If you get this medicine at home, you will be taught how to prepare and give this medicine. Use exactly as directed. Take your medicine at regular intervals. Do not take your medicine more often than directed. °It is important that you put your used needles and syringes in a special sharps container. Do not put them in a trash can. If you do not have a sharps container, call your pharmacist or healthcare provider to get one. °A special MedGuide will be given to you by the pharmacist with each prescription and refill. Be sure to read this information carefully each time. °Talk to your pediatrician regarding the use of this medicine in children. While this drug may be prescribed for selected conditions, precautions do apply. °Overdosage: If you  think you have taken too much of this medicine contact a poison control center or emergency room at once. °NOTE: This medicine is only for you. Do not share this medicine with others. °What if I miss a dose? °If you miss a dose, take it as soon as you can. If it is almost time for your next dose, take only that dose. Do not take double or extra doses. °What may interact with this medicine? °Do not take this medicine with any of the following medications: °-darbepoetin alfa °This list may not describe all possible interactions. Give your health care provider a list of all the medicines, herbs, non-prescription drugs, or dietary supplements you use. Also tell them if you smoke, drink alcohol, or use illegal drugs. Some items may interact with your medicine. °What should I watch for while using this medicine? °Your condition will be monitored carefully while you are receiving this medicine. °You may need blood work done while you are taking this medicine. °What side effects may I notice from receiving this medicine? °Side effects that you should report to your doctor or health care professional as soon as possible: °-allergic reactions like skin rash, itching or hives, swelling of the face, lips, or tongue °-breathing problems °-changes in vision °-chest pain °-confusion, trouble speaking or understanding °-feeling faint or lightheaded, falls °-high blood pressure °-muscle aches or pains °-pain, swelling, warmth in the leg °-rapid weight gain °-severe headaches °-sudden numbness or weakness of the face, arm or leg °-trouble walking, dizziness, loss of balance or coordination °-seizures (convulsions) °-swelling of the ankles, feet, hands °-unusually weak or tired °  Side effects that usually do not require medical attention (report to your doctor or health care professional if they continue or are bothersome): °-diarrhea °-fever, chills (flu-like symptoms) °-headaches °-nausea, vomiting °-redness, stinging, or swelling at  site where injected °This list may not describe all possible side effects. Call your doctor for medical advice about side effects. You may report side effects to FDA at 1-800-FDA-1088. °Where should I keep my medicine? °Keep out of the reach of children. °Store in a refrigerator between 2 and 8 degrees C (36 and 46 degrees F). Do not freeze or shake. Throw away any unused portion if using a single-dose vial. Multi-dose vials can be kept in the refrigerator for up to 21 days after the initial dose. Throw away unused medicine. °NOTE: This sheet is a summary. It may not cover all possible information. If you have questions about this medicine, talk to your doctor, pharmacist, or health care provider. °© 2018 [employer] (2015-11-16 19:42:31) ° °

## 2017-12-25 ENCOUNTER — Inpatient Hospital Stay: Payer: Medicare Other

## 2017-12-25 ENCOUNTER — Other Ambulatory Visit: Payer: Self-pay | Admitting: Medical Oncology

## 2017-12-25 DIAGNOSIS — D469 Myelodysplastic syndrome, unspecified: Secondary | ICD-10-CM | POA: Diagnosis not present

## 2017-12-25 LAB — CBC WITH DIFFERENTIAL (CANCER CENTER ONLY)
BASOS PCT: 3 %
Basophils Absolute: 0.2 10*3/uL — ABNORMAL HIGH (ref 0.0–0.1)
EOS ABS: 0.6 10*3/uL — AB (ref 0.0–0.5)
Eosinophils Relative: 12 %
HCT: 35.9 % — ABNORMAL LOW (ref 38.4–49.9)
Hemoglobin: 11.6 g/dL — ABNORMAL LOW (ref 13.0–17.1)
Lymphocytes Relative: 29 %
Lymphs Abs: 1.4 10*3/uL (ref 0.9–3.3)
MCH: 31.9 pg (ref 27.2–33.4)
MCHC: 32.3 g/dL (ref 32.0–36.0)
MCV: 98.6 fL — ABNORMAL HIGH (ref 79.3–98.0)
MONO ABS: 0.5 10*3/uL (ref 0.1–0.9)
MONOS PCT: 10 %
NEUTROS PCT: 46 %
NRBC: 1 /100{WBCs} — AB
Neutro Abs: 2.3 10*3/uL (ref 1.5–6.5)
PLATELETS: 574 10*3/uL — AB (ref 140–400)
RBC: 3.64 MIL/uL — ABNORMAL LOW (ref 4.20–5.82)
RDW: 28.7 % — AB (ref 11.0–14.6)
WBC Count: 5 10*3/uL (ref 4.0–10.3)

## 2017-12-25 MED ORDER — EPOETIN ALFA 40000 UNIT/ML IJ SOLN
INTRAMUSCULAR | Status: AC
  Filled 2017-12-25: qty 1

## 2017-12-25 NOTE — Progress Notes (Signed)
Pt Hgb 11.6 today no injection needed.

## 2017-12-26 ENCOUNTER — Ambulatory Visit: Payer: Medicare Other | Admitting: Neurology

## 2017-12-26 ENCOUNTER — Encounter: Payer: Self-pay | Admitting: Neurology

## 2017-12-26 ENCOUNTER — Telehealth: Payer: Self-pay | Admitting: Neurology

## 2017-12-26 DIAGNOSIS — R413 Other amnesia: Secondary | ICD-10-CM | POA: Diagnosis not present

## 2017-12-26 HISTORY — DX: Other amnesia: R41.3

## 2017-12-26 MED ORDER — AMPHETAMINE-DEXTROAMPHETAMINE 10 MG PO TABS: 10.0000 mg | ORAL_TABLET | Freq: Every day | ORAL | 0 refills | Status: DC

## 2017-12-26 NOTE — Telephone Encounter (Signed)
Pt. States he will call to schedule 6 mo

## 2017-12-26 NOTE — Progress Notes (Signed)
Reason for visit: Difficulty with concentration  Roy Johnson is an 72 y.o. male  History of present illness:  Roy Johnson is a 72 year old right-handed white male with a history of some problems with focusing and concentration that may occur most days.  The patient also has a dizzy sensation in the head that was felt possibly to be related to a tension type headache.  He was given gabapentin, but he could not tolerate more than 100 mg at nighttime, this does help him sleep at night but he still has the dizzy sensations.  He typically will have problems with focusing that began in mid afternoon, he feels better later in the evening around 9:00 pm or 10:00 pm.  The patient is on several medications for his myelodysplasia, he is not sure that these medications have anything to do with his symptoms.  He was sent for a sleep evaluation, it was decided not to do a sleep study.  The patient sometimes will have to take a nap in the middle of the day, he may feel sleepy and have difficulty with focusing.  Past Medical History:  Diagnosis Date  . Anemia   . Colon polyp   . Diverticulitis   . Diverticulosis   . GERD (gastroesophageal reflux disease)   . Hyperlipidemia   . IBS (irritable bowel syndrome)   . Inguinal hernia   . 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  . Prostate cancer ([place])                . Prostate cancer ([place])   . Umbilical hernia   . Vitamin D deficiency     Past Surgical History:  Procedure Laterality Date  . COLONOSCOPY    . POLYPECTOMY    . PROSTATECTOMY    . TONSILLECTOMY      Family History  Problem Relation Age of Onset  . Heart disease Father   . Colon polyps Father   . Lung cancer Father        cancerous mole  . Prostate cancer Father   . Osteoarthritis Mother   . Breast cancer Mother   . Colon cancer Neg Hx   . Esophageal cancer Neg Hx   . Rectal cancer Neg Hx   . Stomach cancer Neg Hx     Social history:  reports that he quit smoking about 44 years ago. He has  never used smokeless tobacco. He reports that he drinks about 7.0 standard drinks of alcohol per week. He reports that he does not use drugs.   No Known Allergies  Medications:  Prior to Admission medications   Medication Sig Start Date End Date Taking? Authorizing Provider  amLODipine (NORVASC) 5 MG tablet Take 5 mg by mouth daily. 08/30/17  Yes [provider]  anagrelide (AGRYLIN) 0.5 MG capsule Take 0.5 mg by mouth.   Yes [provider]  aspirin EC 81 MG tablet Take 81 mg by mouth.   Yes [provider]  epoetin alfa-epbx (RETACRIT) 73710 UNIT/ML injection Inject into the skin.   Yes [provider]  gabapentin (NEURONTIN) 100 MG capsule One capsule twice a day, 2 at night 09/28/17  Yes Kathrynn Ducking, MD  lenalidomide (REVLIMID) 5 MG capsule Take 5 mg by mouth daily.   Yes [provider]  Multiple Vitamin (MULTI-VITAMINS) TABS Take 1 tablet by mouth daily.   Yes [provider]  Polyethylene Glycol 3350 (PEG 3350) POWD nightly. 08/10/15  Yes [provider]  vitamin B-12 (CYANOCOBALAMIN)  1000 MCG tablet Take 1 tablet by mouth daily as needed.   Yes [provider]  amphetamine-dextroamphetamine (ADDERALL) 10 MG tablet Take 1 tablet (10 mg total) by mouth daily with breakfast. 12/26/17   Kathrynn Ducking, MD    ROS:  Out of a complete 14 system review of symptoms, the patient complains only of the following symptoms, and all other reviewed systems are negative.  Decreased appetite, fatigue Eye pain Constipation Incontinence of the bladder Joint pain, muscle cramps Anemia Dizziness, headache, numbness Confusion, decreased concentration  Blood pressure 121/70, pulse 71, height 5\' 8"  (1.727 m), weight 163 lb (73.9 kg).  Physical Exam  General: The patient is alert and cooperative at the time of the examination.  Skin: No significant peripheral edema is noted.   Neurologic Exam  Mental status: The  patient is alert and oriented x 3 at the time of the examination. The patient has apparent normal recent and remote memory, with an apparently normal attention span and concentration ability.   Cranial nerves: Facial symmetry is present. Speech is normal, no aphasia or dysarthria is noted. Extraocular movements are full. Visual fields are full.  Motor: The patient has good strength in all 4 extremities.  Sensory examination: Soft touch sensation is symmetric on the face, arms, and legs.  Coordination: The patient has good finger-nose-finger and heel-to-shin bilaterally.  Gait and station: The patient has a normal gait. Tandem gait is normal. Romberg is negative. No drift is seen.  Reflexes: Deep tendon reflexes are symmetric.   Assessment/Plan:  1.  Difficulty with focusing  2.  Dizziness, possible tension headache  The patient will be given a small prescription for Adderall 10 mg to take daily.  If he finds that this is helpful, he will call and we will make some dose adjustments if needed.  The patient will follow-up otherwise in about 6 to 9 months.  Jill Alexanders MD 12/26/2017 3:37 PM  [facility] [address] [place], [place] [address]  Phone [phone] Fax [phone]

## 2017-12-29 ENCOUNTER — Other Ambulatory Visit: Payer: Self-pay

## 2017-12-29 DIAGNOSIS — D469 Myelodysplastic syndrome, unspecified: Secondary | ICD-10-CM

## 2018-01-01 ENCOUNTER — Inpatient Hospital Stay: Payer: Medicare Other

## 2018-01-01 ENCOUNTER — Telehealth: Payer: Self-pay

## 2018-01-01 DIAGNOSIS — D469 Myelodysplastic syndrome, unspecified: Secondary | ICD-10-CM

## 2018-01-01 LAB — CMP (CANCER CENTER ONLY)
ALBUMIN: 4.1 g/dL (ref 3.5–5.0)
ALK PHOS: 77 U/L (ref 38–126)
ALT: 57 U/L — ABNORMAL HIGH (ref 0–44)
ANION GAP: 7 (ref 5–15)
AST: 28 U/L (ref 15–41)
BUN: 18 mg/dL (ref 8–23)
CO2: 28 mmol/L (ref 22–32)
CREATININE: 0.91 mg/dL (ref 0.61–1.24)
Calcium: 9.2 mg/dL (ref 8.9–10.3)
Chloride: 107 mmol/L (ref 98–111)
GFR, Est AFR Am: >60 mL/min (ref >60)
GFR, Estimated: >60 mL/min (ref >60)
GLUCOSE: 120 mg/dL — AB (ref 70–99)
Potassium: 4.6 mmol/L (ref 3.5–5.1)
Sodium: 142 mmol/L (ref 135–145)
TOTAL PROTEIN: 7.1 g/dL (ref 6.5–8.1)
Total Bilirubin: 1.3 mg/dL — ABNORMAL HIGH (ref 0.3–1.2)

## 2018-01-01 LAB — CBC WITH DIFFERENTIAL (CANCER CENTER ONLY)
BASOS ABS: 0.1 10*3/uL (ref 0.0–0.1)
BASOS PCT: 2 %
EOS ABS: 0.8 10*3/uL — AB (ref 0.0–0.5)
EOS PCT: 12 %
HCT: 34.8 % — ABNORMAL LOW (ref 38.4–49.9)
Hemoglobin: 11.2 g/dL — ABNORMAL LOW (ref 13.0–17.1)
Lymphocytes Relative: 27 %
Lymphs Abs: 1.7 10*3/uL (ref 0.9–3.3)
MCH: 31.3 pg (ref 27.2–33.4)
MCHC: 32.2 g/dL (ref 32.0–36.0)
MCV: 97.2 fL (ref 79.3–98.0)
MONO ABS: 0.7 10*3/uL (ref 0.1–0.9)
Monocytes Relative: 11 %
Neutro Abs: 3 10*3/uL (ref 1.5–6.5)
Neutrophils Relative %: 48 %
PLATELETS: 555 10*3/uL — AB (ref 140–400)
RBC: 3.58 MIL/uL — ABNORMAL LOW (ref 4.20–5.82)
RDW: 28.9 % — AB (ref 11.0–14.6)
WBC Count: 6.2 10*3/uL (ref 4.0–10.3)

## 2018-01-01 NOTE — Telephone Encounter (Signed)
Called [name] to let him know that he did not need his injection.  We wanted to make sure he was not waiting in the lobby for a nurse to come get him.  Advised him to call back if he had any questions or concerns.  [name]

## 2018-01-08 ENCOUNTER — Inpatient Hospital Stay: Payer: Medicare Other

## 2018-01-08 VITALS — BP 140/74 | HR 72 | Temp 98.0°F | Resp 18

## 2018-01-08 DIAGNOSIS — D469 Myelodysplastic syndrome, unspecified: Secondary | ICD-10-CM | POA: Diagnosis not present

## 2018-01-08 LAB — CMP (CANCER CENTER ONLY)
ALK PHOS: 83 U/L (ref 38–126)
ALT: 55 U/L — AB (ref 0–44)
AST: 22 U/L (ref 15–41)
Albumin: 3.8 g/dL (ref 3.5–5.0)
Anion gap: 6 (ref 5–15)
BUN: 18 mg/dL (ref 8–23)
CALCIUM: 9 mg/dL (ref 8.9–10.3)
CHLORIDE: 110 mmol/L (ref 98–111)
CO2: 28 mmol/L (ref 22–32)
Creatinine: 0.84 mg/dL (ref 0.61–1.24)
GFR, Estimated: >60 mL/min (ref >60)
Glucose, Bld: 127 mg/dL — ABNORMAL HIGH (ref 70–99)
Potassium: 4.7 mmol/L (ref 3.5–5.1)
SODIUM: 144 mmol/L (ref 135–145)
Total Bilirubin: 0.8 mg/dL (ref 0.3–1.2)
Total Protein: 6.6 g/dL (ref 6.5–8.1)

## 2018-01-08 LAB — CBC WITH DIFFERENTIAL (CANCER CENTER ONLY)
Basophils Absolute: 0 10*3/uL (ref 0.0–0.1)
Basophils Relative: 1 %
EOS PCT: 14 %
Eosinophils Absolute: 0.7 10*3/uL — ABNORMAL HIGH (ref 0.0–0.5)
HCT: 30.2 % — ABNORMAL LOW (ref 38.4–49.9)
Hemoglobin: 10 g/dL — ABNORMAL LOW (ref 13.0–17.1)
Lymphocytes Relative: 22 %
Lymphs Abs: 1.1 10*3/uL (ref 0.9–3.3)
MCH: 33.2 pg (ref 27.2–33.4)
MCHC: 33.1 g/dL (ref 32.0–36.0)
MCV: 100.2 fL — ABNORMAL HIGH (ref 79.3–98.0)
MONO ABS: 0.6 10*3/uL (ref 0.1–0.9)
MONOS PCT: 12 %
Neutro Abs: 2.5 10*3/uL (ref 1.5–6.5)
Neutrophils Relative %: 51 %
PLATELETS: 579 10*3/uL — AB (ref 140–400)
RBC: 3.02 MIL/uL — ABNORMAL LOW (ref 4.20–5.82)
RDW: 29.3 % — AB (ref 11.0–14.6)
WBC Count: 5 10*3/uL (ref 4.0–10.3)

## 2018-01-08 MED ORDER — EPOETIN ALFA 20000 UNIT/ML IJ SOLN
40000.0000 [IU] | Freq: Once | INTRAMUSCULAR | Status: AC
  Administered 2018-01-08: 40000 [IU] via SUBCUTANEOUS

## 2018-01-08 NOTE — Patient Instructions (Signed)
Epoetin Alfa injection °What is this medicine? °EPOETIN ALFA (e POE e tin AL fa) helps your body make more red blood cells. This medicine is used to treat anemia caused by chronic kidney failure, cancer chemotherapy, or HIV-therapy. It may also be used before surgery if you have anemia. °This medicine may be used for other purposes; ask your health care provider or pharmacist if you have questions. °COMMON BRAND NAME(S): Epogen, Procrit °What should I tell my health care provider before I take this medicine? °They need to know if you have any of these conditions: °-blood clotting disorders °-cancer patient not on chemotherapy °-cystic fibrosis °-heart disease, such as angina or heart failure °-hemoglobin level of 12 g/dL or greater °-high blood pressure °-low levels of folate, iron, or vitamin B12 °-seizures °-an unusual or allergic reaction to erythropoietin, albumin, benzyl alcohol, hamster proteins, other medicines, foods, dyes, or preservatives °-pregnant or trying to get pregnant °-breast-feeding °How should I use this medicine? °This medicine is for injection into a vein or under the skin. It is usually given by a health care professional in a hospital or clinic setting. °If you get this medicine at home, you will be taught how to prepare and give this medicine. Use exactly as directed. Take your medicine at regular intervals. Do not take your medicine more often than directed. °It is important that you put your used needles and syringes in a special sharps container. Do not put them in a trash can. If you do not have a sharps container, call your pharmacist or healthcare provider to get one. °A special MedGuide will be given to you by the pharmacist with each prescription and refill. Be sure to read this information carefully each time. °Talk to your pediatrician regarding the use of this medicine in children. While this drug may be prescribed for selected conditions, precautions do apply. °Overdosage: If you  think you have taken too much of this medicine contact a poison control center or emergency room at once. °NOTE: This medicine is only for you. Do not share this medicine with others. °What if I miss a dose? °If you miss a dose, take it as soon as you can. If it is almost time for your next dose, take only that dose. Do not take double or extra doses. °What may interact with this medicine? °Do not take this medicine with any of the following medications: °-darbepoetin alfa °This list may not describe all possible interactions. Give your health care provider a list of all the medicines, herbs, non-prescription drugs, or dietary supplements you use. Also tell them if you smoke, drink alcohol, or use illegal drugs. Some items may interact with your medicine. °What should I watch for while using this medicine? °Your condition will be monitored carefully while you are receiving this medicine. °You may need blood work done while you are taking this medicine. °What side effects may I notice from receiving this medicine? °Side effects that you should report to your doctor or health care professional as soon as possible: °-allergic reactions like skin rash, itching or hives, swelling of the face, lips, or tongue °-breathing problems °-changes in vision °-chest pain °-confusion, trouble speaking or understanding °-feeling faint or lightheaded, falls °-high blood pressure °-muscle aches or pains °-pain, swelling, warmth in the leg °-rapid weight gain °-severe headaches °-sudden numbness or weakness of the face, arm or leg °-trouble walking, dizziness, loss of balance or coordination °-seizures (convulsions) °-swelling of the ankles, feet, hands °-unusually weak or tired °  Side effects that usually do not require medical attention (report to your doctor or health care professional if they continue or are bothersome): °-diarrhea °-fever, chills (flu-like symptoms) °-headaches °-nausea, vomiting °-redness, stinging, or swelling at  site where injected °This list may not describe all possible side effects. Call your doctor for medical advice about side effects. You may report side effects to FDA at 1-800-FDA-1088. °Where should I keep my medicine? °Keep out of the reach of children. °Store in a refrigerator between 2 and 8 degrees C (36 and 46 degrees F). Do not freeze or shake. Throw away any unused portion if using a single-dose vial. Multi-dose vials can be kept in the refrigerator for up to 21 days after the initial dose. Throw away unused medicine. °NOTE: This sheet is a summary. It may not cover all possible information. If you have questions about this medicine, talk to your doctor, pharmacist, or health care provider. °© 2018 [employer] (2015-11-16 19:42:31) ° °

## 2018-01-15 ENCOUNTER — Inpatient Hospital Stay: Payer: Medicare Other

## 2018-01-15 ENCOUNTER — Inpatient Hospital Stay: Payer: Medicare Other | Attending: Internal Medicine

## 2018-01-15 VITALS — BP 122/70 | HR 72 | Temp 98.2°F | Resp 17

## 2018-01-15 DIAGNOSIS — D469 Myelodysplastic syndrome, unspecified: Secondary | ICD-10-CM

## 2018-01-15 LAB — CBC WITH DIFFERENTIAL (CANCER CENTER ONLY)
Basophils Absolute: 0.2 10*3/uL — ABNORMAL HIGH (ref 0.0–0.1)
Basophils Relative: 3 %
EOS ABS: 0.3 10*3/uL (ref 0.0–0.5)
EOS PCT: 5 %
HCT: 31.5 % — ABNORMAL LOW (ref 38.4–49.9)
Hemoglobin: 10.2 g/dL — ABNORMAL LOW (ref 13.0–17.1)
Lymphocytes Relative: 20 %
Lymphs Abs: 1.3 10*3/uL (ref 0.9–3.3)
MCH: 31.9 pg (ref 27.2–33.4)
MCHC: 32.4 g/dL (ref 32.0–36.0)
MCV: 98.4 fL — ABNORMAL HIGH (ref 79.3–98.0)
MONOS PCT: 13 %
Monocytes Absolute: 0.8 10*3/uL (ref 0.1–0.9)
NEUTROS PCT: 59 %
NRBC: 1 /100{WBCs} — AB
Neutro Abs: 3.9 10*3/uL (ref 1.5–6.5)
Platelet Count: 784 10*3/uL — ABNORMAL HIGH (ref 140–400)
RBC: 3.2 MIL/uL — ABNORMAL LOW (ref 4.20–5.82)
RDW: 29 % — AB (ref 11.0–14.6)
WBC Count: 6.6 10*3/uL (ref 4.0–10.3)

## 2018-01-15 LAB — CMP (CANCER CENTER ONLY)
ALBUMIN: 3.8 g/dL (ref 3.5–5.0)
ALT: 63 U/L — AB (ref 0–44)
AST: 30 U/L (ref 15–41)
Alkaline Phosphatase: 85 U/L (ref 38–126)
Anion gap: 6 (ref 5–15)
BUN: 16 mg/dL (ref 8–23)
CHLORIDE: 110 mmol/L (ref 98–111)
CO2: 27 mmol/L (ref 22–32)
CREATININE: 0.88 mg/dL (ref 0.61–1.24)
Calcium: 9.1 mg/dL (ref 8.9–10.3)
GFR, Est AFR Am: >60 mL/min (ref >60)
GFR, Estimated: >60 mL/min (ref >60)
GLUCOSE: 111 mg/dL — AB (ref 70–99)
Potassium: 4.4 mmol/L (ref 3.5–5.1)
SODIUM: 143 mmol/L (ref 135–145)
Total Bilirubin: 0.6 mg/dL (ref 0.3–1.2)
Total Protein: 6.8 g/dL (ref 6.5–8.1)

## 2018-01-15 MED ORDER — EPOETIN ALFA 40000 UNIT/ML IJ SOLN
INTRAMUSCULAR | Status: AC
  Filled 2018-01-15: qty 1

## 2018-01-15 MED ORDER — EPOETIN ALFA 40000 UNIT/ML IJ SOLN
40000.0000 [IU] | Freq: Once | INTRAMUSCULAR | Status: AC
  Administered 2018-01-15: 40000 [IU] via SUBCUTANEOUS

## 2018-01-22 ENCOUNTER — Inpatient Hospital Stay: Payer: Medicare Other

## 2018-01-22 DIAGNOSIS — D469 Myelodysplastic syndrome, unspecified: Secondary | ICD-10-CM | POA: Diagnosis not present

## 2018-01-22 LAB — COMPREHENSIVE METABOLIC PANEL
ALBUMIN: 4.1 g/dL (ref 3.5–5.0)
ALT: 55 U/L — ABNORMAL HIGH (ref 0–44)
AST: 23 U/L (ref 15–41)
Alkaline Phosphatase: 64 U/L (ref 38–126)
Anion gap: 5 (ref 5–15)
BILIRUBIN TOTAL: 0.9 mg/dL (ref 0.3–1.2)
BUN: 21 mg/dL (ref 8–23)
CHLORIDE: 110 mmol/L (ref 98–111)
CO2: 29 mmol/L (ref 22–32)
Calcium: 9.1 mg/dL (ref 8.9–10.3)
Creatinine, Ser: 0.92 mg/dL (ref 0.61–1.24)
GFR calc Af Amer: >60 mL/min (ref >60)
GFR calc non Af Amer: >60 mL/min (ref >60)
GLUCOSE: 110 mg/dL — AB (ref 70–99)
POTASSIUM: 4.6 mmol/L (ref 3.5–5.1)
SODIUM: 144 mmol/L (ref 135–145)
TOTAL PROTEIN: 7.1 g/dL (ref 6.5–8.1)

## 2018-01-22 LAB — CBC WITH DIFFERENTIAL (CANCER CENTER ONLY)
ABS IMMATURE GRANULOCYTES: 0.1 10*3/uL — AB (ref 0.00–0.07)
BASOS ABS: 0.1 10*3/uL (ref 0.0–0.1)
BASOS PCT: 1 %
Eosinophils Absolute: 0.6 10*3/uL — ABNORMAL HIGH (ref 0.0–0.5)
Eosinophils Relative: 11 %
HCT: 35.7 % — ABNORMAL LOW (ref 39.0–52.0)
Hemoglobin: 11.5 g/dL — ABNORMAL LOW (ref 13.0–17.0)
IMMATURE GRANULOCYTES: 2 %
Lymphocytes Relative: 23 %
Lymphs Abs: 1.3 10*3/uL (ref 0.7–4.0)
MCH: 32.7 pg (ref 26.0–34.0)
MCHC: 32.2 g/dL (ref 30.0–36.0)
MCV: 101.4 fL — ABNORMAL HIGH (ref 80.0–100.0)
MONOS PCT: 9 %
Monocytes Absolute: 0.5 10*3/uL (ref 0.1–1.0)
NEUTROS ABS: 3 10*3/uL (ref 1.7–7.7)
NEUTROS PCT: 54 %
PLATELETS: 600 10*3/uL — AB (ref 150–400)
RBC: 3.52 MIL/uL — ABNORMAL LOW (ref 4.22–5.81)
RDW: 28.1 % — AB (ref 11.5–15.5)
WBC Count: 5.6 10*3/uL (ref 4.0–10.5)
nRBC: 0.5 % — ABNORMAL HIGH (ref 0.0–0.2)

## 2018-01-22 NOTE — Progress Notes (Signed)
Pt did not meet parameters for injection. Hgb 11.5. Lab results were given to pt. Will return for next appt. Porsche Cates LPN

## 2018-01-29 ENCOUNTER — Inpatient Hospital Stay: Payer: Medicare Other

## 2018-01-29 DIAGNOSIS — D469 Myelodysplastic syndrome, unspecified: Secondary | ICD-10-CM

## 2018-01-29 LAB — CMP (CANCER CENTER ONLY)
ALBUMIN: 3.9 g/dL (ref 3.5–5.0)
ALK PHOS: 76 U/L (ref 38–126)
ALT: 46 U/L — ABNORMAL HIGH (ref 0–44)
ANION GAP: 8 (ref 5–15)
AST: 19 U/L (ref 15–41)
BILIRUBIN TOTAL: 1.1 mg/dL (ref 0.3–1.2)
BUN: 16 mg/dL (ref 8–23)
CO2: 26 mmol/L (ref 22–32)
Calcium: 9 mg/dL (ref 8.9–10.3)
Chloride: 109 mmol/L (ref 98–111)
Creatinine: 0.93 mg/dL (ref 0.61–1.24)
GFR, Est AFR Am: >60 mL/min (ref >60)
GFR, Estimated: >60 mL/min (ref >60)
GLUCOSE: 121 mg/dL — AB (ref 70–99)
POTASSIUM: 4.2 mmol/L (ref 3.5–5.1)
SODIUM: 143 mmol/L (ref 135–145)
TOTAL PROTEIN: 6.8 g/dL (ref 6.5–8.1)

## 2018-01-29 LAB — CBC WITH DIFFERENTIAL (CANCER CENTER ONLY)
ABS IMMATURE GRANULOCYTES: 0.01 10*3/uL (ref 0.00–0.07)
BASOS ABS: 0.1 10*3/uL (ref 0.0–0.1)
Basophils Relative: 2 %
EOS PCT: 12 %
Eosinophils Absolute: 0.8 10*3/uL — ABNORMAL HIGH (ref 0.0–0.5)
HCT: 34.7 % — ABNORMAL LOW (ref 39.0–52.0)
Hemoglobin: 11.2 g/dL — ABNORMAL LOW (ref 13.0–17.0)
IMMATURE GRANULOCYTES: 0 %
LYMPHS ABS: 1.4 10*3/uL (ref 0.7–4.0)
Lymphocytes Relative: 22 %
MCH: 32.5 pg (ref 26.0–34.0)
MCHC: 32.3 g/dL (ref 30.0–36.0)
MCV: 100.6 fL — ABNORMAL HIGH (ref 80.0–100.0)
Monocytes Absolute: 0.8 10*3/uL (ref 0.1–1.0)
Monocytes Relative: 12 %
NEUTROS ABS: 3.3 10*3/uL (ref 1.7–7.7)
NEUTROS PCT: 52 %
PLATELETS: 532 10*3/uL — AB (ref 150–400)
RBC: 3.45 MIL/uL — AB (ref 4.22–5.81)
RDW: 28.4 % — ABNORMAL HIGH (ref 11.5–15.5)
WBC Count: 6.4 10*3/uL (ref 4.0–10.5)
nRBC: 0 % (ref 0.0–0.2)

## 2018-01-29 MED ORDER — EPOETIN ALFA 40000 UNIT/ML IJ SOLN
INTRAMUSCULAR | Status: AC
  Filled 2018-01-29: qty 1

## 2018-01-29 NOTE — Progress Notes (Signed)
Pt did not meet parameters for injection. 11.2 Hgb. Lab results were given to pt and will return for next appt. Porsche Cates LPN

## 2018-02-05 ENCOUNTER — Inpatient Hospital Stay: Payer: Medicare Other

## 2018-02-05 VITALS — BP 123/73 | HR 65 | Temp 97.5°F | Resp 18

## 2018-02-05 DIAGNOSIS — D469 Myelodysplastic syndrome, unspecified: Secondary | ICD-10-CM

## 2018-02-05 LAB — CBC WITH DIFFERENTIAL (CANCER CENTER ONLY)
Abs Immature Granulocytes: 0.01 10*3/uL (ref 0.00–0.07)
BASOS PCT: 3 %
Basophils Absolute: 0.2 10*3/uL — ABNORMAL HIGH (ref 0.0–0.1)
EOS ABS: 0.8 10*3/uL — AB (ref 0.0–0.5)
Eosinophils Relative: 14 %
HCT: 30.9 % — ABNORMAL LOW (ref 39.0–52.0)
Hemoglobin: 9.9 g/dL — ABNORMAL LOW (ref 13.0–17.0)
Immature Granulocytes: 0 %
Lymphocytes Relative: 25 %
Lymphs Abs: 1.5 10*3/uL (ref 0.7–4.0)
MCH: 31.9 pg (ref 26.0–34.0)
MCHC: 32 g/dL (ref 30.0–36.0)
MCV: 99.7 fL (ref 80.0–100.0)
MONO ABS: 0.7 10*3/uL (ref 0.1–1.0)
Monocytes Relative: 12 %
Neutro Abs: 2.7 10*3/uL (ref 1.7–7.7)
Neutrophils Relative %: 46 %
PLATELETS: 640 10*3/uL — AB (ref 150–400)
RBC: 3.1 MIL/uL — ABNORMAL LOW (ref 4.22–5.81)
RDW: 27.8 % — AB (ref 11.5–15.5)
WBC: 5.9 10*3/uL (ref 4.0–10.5)
nRBC: 0 % (ref 0.0–0.2)

## 2018-02-05 LAB — CMP (CANCER CENTER ONLY)
ALBUMIN: 3.8 g/dL (ref 3.5–5.0)
ALK PHOS: 79 U/L (ref 38–126)
ALT: 42 U/L (ref 0–44)
AST: 20 U/L (ref 15–41)
Anion gap: 6 (ref 5–15)
BILIRUBIN TOTAL: 1 mg/dL (ref 0.3–1.2)
BUN: 15 mg/dL (ref 8–23)
CALCIUM: 8.6 mg/dL — AB (ref 8.9–10.3)
CO2: 26 mmol/L (ref 22–32)
Chloride: 110 mmol/L (ref 98–111)
Creatinine: 0.93 mg/dL (ref 0.61–1.24)
GFR, Est AFR Am: >60 mL/min (ref >60)
GLUCOSE: 97 mg/dL (ref 70–99)
Potassium: 4.5 mmol/L (ref 3.5–5.1)
Sodium: 142 mmol/L (ref 135–145)
TOTAL PROTEIN: 6.5 g/dL (ref 6.5–8.1)

## 2018-02-05 MED ORDER — EPOETIN ALFA 20000 UNIT/ML IJ SOLN
40000.0000 [IU] | Freq: Once | INTRAMUSCULAR | Status: AC
  Administered 2018-02-05: 40000 [IU] via SUBCUTANEOUS

## 2018-02-05 MED ORDER — EPOETIN ALFA 40000 UNIT/ML IJ SOLN
INTRAMUSCULAR | Status: AC
  Filled 2018-02-05: qty 1

## 2018-02-12 ENCOUNTER — Inpatient Hospital Stay: Payer: Medicare Other

## 2018-02-12 ENCOUNTER — Inpatient Hospital Stay: Payer: Medicare Other | Attending: Internal Medicine

## 2018-02-12 VITALS — BP 149/78 | HR 72 | Temp 98.0°F | Resp 17

## 2018-02-12 DIAGNOSIS — D469 Myelodysplastic syndrome, unspecified: Secondary | ICD-10-CM

## 2018-02-12 LAB — CMP (CANCER CENTER ONLY)
ALBUMIN: 3.8 g/dL (ref 3.5–5.0)
ALT: 39 U/L (ref 0–44)
AST: 19 U/L (ref 15–41)
Alkaline Phosphatase: 77 U/L (ref 38–126)
Anion gap: 5 (ref 5–15)
BILIRUBIN TOTAL: 0.7 mg/dL (ref 0.3–1.2)
BUN: 15 mg/dL (ref 8–23)
CO2: 28 mmol/L (ref 22–32)
CREATININE: 0.94 mg/dL (ref 0.61–1.24)
Calcium: 9.1 mg/dL (ref 8.9–10.3)
Chloride: 109 mmol/L (ref 98–111)
GFR, Est AFR Am: >60 mL/min (ref >60)
GLUCOSE: 126 mg/dL — AB (ref 70–99)
POTASSIUM: 4.6 mmol/L (ref 3.5–5.1)
Sodium: 142 mmol/L (ref 135–145)
TOTAL PROTEIN: 6.9 g/dL (ref 6.5–8.1)

## 2018-02-12 LAB — CBC WITH DIFFERENTIAL (CANCER CENTER ONLY)
ABS IMMATURE GRANULOCYTES: 0.09 10*3/uL — AB (ref 0.00–0.07)
BASOS PCT: 2 %
Basophils Absolute: 0.2 10*3/uL — ABNORMAL HIGH (ref 0.0–0.1)
Eosinophils Absolute: 0.4 10*3/uL (ref 0.0–0.5)
Eosinophils Relative: 6 %
HEMATOCRIT: 32.5 % — AB (ref 39.0–52.0)
Hemoglobin: 10.4 g/dL — ABNORMAL LOW (ref 13.0–17.0)
IMMATURE GRANULOCYTES: 1 %
Lymphocytes Relative: 20 %
Lymphs Abs: 1.4 10*3/uL (ref 0.7–4.0)
MCH: 32.3 pg (ref 26.0–34.0)
MCHC: 32 g/dL (ref 30.0–36.0)
MCV: 100.9 fL — ABNORMAL HIGH (ref 80.0–100.0)
MONO ABS: 0.9 10*3/uL (ref 0.1–1.0)
MONOS PCT: 12 %
NEUTROS ABS: 4.3 10*3/uL (ref 1.7–7.7)
Neutrophils Relative %: 59 %
Platelet Count: 685 10*3/uL — ABNORMAL HIGH (ref 150–400)
RBC: 3.22 MIL/uL — AB (ref 4.22–5.81)
RDW: 27.4 % — AB (ref 11.5–15.5)
WBC: 7.3 10*3/uL (ref 4.0–10.5)
nRBC: 0.5 % — ABNORMAL HIGH (ref 0.0–0.2)

## 2018-02-12 MED ORDER — EPOETIN ALFA 40000 UNIT/ML IJ SOLN
INTRAMUSCULAR | Status: AC
  Filled 2018-02-12: qty 1

## 2018-02-12 MED ORDER — EPOETIN ALFA 40000 UNIT/ML IJ SOLN
40000.0000 [IU] | Freq: Once | INTRAMUSCULAR | Status: AC
  Administered 2018-02-12: 40000 [IU] via SUBCUTANEOUS

## 2018-02-12 NOTE — Patient Instructions (Signed)
Epoetin Alfa injection °What is this medicine? °EPOETIN ALFA (e POE e tin AL fa) helps your body make more red blood cells. This medicine is used to treat anemia caused by chronic kidney failure, cancer chemotherapy, or HIV-therapy. It may also be used before surgery if you have anemia. °This medicine may be used for other purposes; ask your health care provider or pharmacist if you have questions. °COMMON BRAND NAME(S): Epogen, Procrit °What should I tell my health care provider before I take this medicine? °They need to know if you have any of these conditions: °-blood clotting disorders °-cancer patient not on chemotherapy °-cystic fibrosis °-heart disease, such as angina or heart failure °-hemoglobin level of 12 g/dL or greater °-high blood pressure °-low levels of folate, iron, or vitamin B12 °-seizures °-an unusual or allergic reaction to erythropoietin, albumin, benzyl alcohol, hamster proteins, other medicines, foods, dyes, or preservatives °-pregnant or trying to get pregnant °-breast-feeding °How should I use this medicine? °This medicine is for injection into a vein or under the skin. It is usually given by a health care professional in a hospital or clinic setting. °If you get this medicine at home, you will be taught how to prepare and give this medicine. Use exactly as directed. Take your medicine at regular intervals. Do not take your medicine more often than directed. °It is important that you put your used needles and syringes in a special sharps container. Do not put them in a trash can. If you do not have a sharps container, call your pharmacist or healthcare provider to get one. °A special MedGuide will be given to you by the pharmacist with each prescription and refill. Be sure to read this information carefully each time. °Talk to your pediatrician regarding the use of this medicine in children. While this drug may be prescribed for selected conditions, precautions do apply. °Overdosage: If you  think you have taken too much of this medicine contact a poison control center or emergency room at once. °NOTE: This medicine is only for you. Do not share this medicine with others. °What if I miss a dose? °If you miss a dose, take it as soon as you can. If it is almost time for your next dose, take only that dose. Do not take double or extra doses. °What may interact with this medicine? °Do not take this medicine with any of the following medications: °-darbepoetin alfa °This list may not describe all possible interactions. Give your health care provider a list of all the medicines, herbs, non-prescription drugs, or dietary supplements you use. Also tell them if you smoke, drink alcohol, or use illegal drugs. Some items may interact with your medicine. °What should I watch for while using this medicine? °Your condition will be monitored carefully while you are receiving this medicine. °You may need blood work done while you are taking this medicine. °What side effects may I notice from receiving this medicine? °Side effects that you should report to your doctor or health care professional as soon as possible: °-allergic reactions like skin rash, itching or hives, swelling of the face, lips, or tongue °-breathing problems °-changes in vision °-chest pain °-confusion, trouble speaking or understanding °-feeling faint or lightheaded, falls °-high blood pressure °-muscle aches or pains °-pain, swelling, warmth in the leg °-rapid weight gain °-severe headaches °-sudden numbness or weakness of the face, arm or leg °-trouble walking, dizziness, loss of balance or coordination °-seizures (convulsions) °-swelling of the ankles, feet, hands °-unusually weak or tired °  Side effects that usually do not require medical attention (report to your doctor or health care professional if they continue or are bothersome): °-diarrhea °-fever, chills (flu-like symptoms) °-headaches °-nausea, vomiting °-redness, stinging, or swelling at  site where injected °This list may not describe all possible side effects. Call your doctor for medical advice about side effects. You may report side effects to FDA at 1-800-FDA-1088. °Where should I keep my medicine? °Keep out of the reach of children. °Store in a refrigerator between 2 and 8 degrees C (36 and 46 degrees F). Do not freeze or shake. Throw away any unused portion if using a single-dose vial. Multi-dose vials can be kept in the refrigerator for up to 21 days after the initial dose. Throw away unused medicine. °NOTE: This sheet is a summary. It may not cover all possible information. If you have questions about this medicine, talk to your doctor, pharmacist, or health care provider. °© 2018 [employer] (2015-11-16 19:42:31) ° °

## 2018-02-19 ENCOUNTER — Inpatient Hospital Stay: Payer: Medicare Other

## 2018-02-19 ENCOUNTER — Telehealth: Payer: Self-pay | Admitting: Emergency Medicine

## 2018-02-19 DIAGNOSIS — D469 Myelodysplastic syndrome, unspecified: Secondary | ICD-10-CM

## 2018-02-19 LAB — CBC WITH DIFFERENTIAL (CANCER CENTER ONLY)
Abs Immature Granulocytes: 0.05 10*3/uL (ref 0.00–0.07)
BASOS ABS: 0.1 10*3/uL (ref 0.0–0.1)
Basophils Relative: 2 %
EOS ABS: 0.6 10*3/uL — AB (ref 0.0–0.5)
EOS PCT: 10 %
HCT: 35.3 % — ABNORMAL LOW (ref 39.0–52.0)
Hemoglobin: 11.5 g/dL — ABNORMAL LOW (ref 13.0–17.0)
IMMATURE GRANULOCYTES: 1 %
LYMPHS ABS: 1.4 10*3/uL (ref 0.7–4.0)
LYMPHS PCT: 24 %
MCH: 32.9 pg (ref 26.0–34.0)
MCHC: 32.6 g/dL (ref 30.0–36.0)
MCV: 100.9 fL — ABNORMAL HIGH (ref 80.0–100.0)
Monocytes Absolute: 0.5 10*3/uL (ref 0.1–1.0)
Monocytes Relative: 9 %
NRBC: 0.5 % — AB (ref 0.0–0.2)
Neutro Abs: 3 10*3/uL (ref 1.7–7.7)
Neutrophils Relative %: 54 %
Platelet Count: 547 10*3/uL — ABNORMAL HIGH (ref 150–400)
RBC: 3.5 MIL/uL — AB (ref 4.22–5.81)
RDW: 27.8 % — AB (ref 11.5–15.5)
WBC: 5.6 10*3/uL (ref 4.0–10.5)

## 2018-02-19 LAB — CMP (CANCER CENTER ONLY)
ALT: 42 U/L (ref 0–44)
ANION GAP: 8 (ref 5–15)
AST: 21 U/L (ref 15–41)
Albumin: 3.9 g/dL (ref 3.5–5.0)
Alkaline Phosphatase: 75 U/L (ref 38–126)
BUN: 17 mg/dL (ref 8–23)
CALCIUM: 8.7 mg/dL — AB (ref 8.9–10.3)
CHLORIDE: 108 mmol/L (ref 98–111)
CO2: 26 mmol/L (ref 22–32)
CREATININE: 0.95 mg/dL (ref 0.61–1.24)
GFR, Estimated: >60 mL/min (ref >60)
Glucose, Bld: 122 mg/dL — ABNORMAL HIGH (ref 70–99)
Potassium: 4.4 mmol/L (ref 3.5–5.1)
SODIUM: 142 mmol/L (ref 135–145)
Total Bilirubin: 1.4 mg/dL — ABNORMAL HIGH (ref 0.3–1.2)
Total Protein: 6.9 g/dL (ref 6.5–8.1)

## 2018-02-19 NOTE — Telephone Encounter (Signed)
Patient does not need procrit injection today d/t hgb being 11.5. He verbalized understanding of this.

## 2018-02-26 ENCOUNTER — Inpatient Hospital Stay: Payer: Medicare Other

## 2018-02-26 VITALS — BP 130/73 | HR 69 | Temp 97.6°F | Resp 18

## 2018-02-26 DIAGNOSIS — D469 Myelodysplastic syndrome, unspecified: Secondary | ICD-10-CM | POA: Diagnosis not present

## 2018-02-26 LAB — CMP (CANCER CENTER ONLY)
ALBUMIN: 3.9 g/dL (ref 3.5–5.0)
ALK PHOS: 76 U/L (ref 38–126)
ALT: 46 U/L — ABNORMAL HIGH (ref 0–44)
AST: 23 U/L (ref 15–41)
Anion gap: 5 (ref 5–15)
BILIRUBIN TOTAL: 1 mg/dL (ref 0.3–1.2)
BUN: 17 mg/dL (ref 8–23)
CALCIUM: 9 mg/dL (ref 8.9–10.3)
CO2: 29 mmol/L (ref 22–32)
CREATININE: 0.96 mg/dL (ref 0.61–1.24)
Chloride: 107 mmol/L (ref 98–111)
GFR, Est AFR Am: >60 mL/min (ref >60)
Glucose, Bld: 105 mg/dL — ABNORMAL HIGH (ref 70–99)
Potassium: 4.1 mmol/L (ref 3.5–5.1)
SODIUM: 141 mmol/L (ref 135–145)
TOTAL PROTEIN: 6.8 g/dL (ref 6.5–8.1)

## 2018-02-26 LAB — CBC WITH DIFFERENTIAL (CANCER CENTER ONLY)
Abs Immature Granulocytes: 0.01 10*3/uL (ref 0.00–0.07)
BASOS ABS: 0.1 10*3/uL (ref 0.0–0.1)
BASOS PCT: 2 %
EOS ABS: 0.9 10*3/uL — AB (ref 0.0–0.5)
EOS PCT: 13 %
HEMATOCRIT: 33.3 % — AB (ref 39.0–52.0)
HEMOGLOBIN: 10.7 g/dL — AB (ref 13.0–17.0)
IMMATURE GRANULOCYTES: 0 %
LYMPHS ABS: 1.5 10*3/uL (ref 0.7–4.0)
Lymphocytes Relative: 23 %
MCH: 32.4 pg (ref 26.0–34.0)
MCHC: 32.1 g/dL (ref 30.0–36.0)
MCV: 100.9 fL — AB (ref 80.0–100.0)
Monocytes Absolute: 0.9 10*3/uL (ref 0.1–1.0)
Monocytes Relative: 14 %
Neutro Abs: 3.2 10*3/uL (ref 1.7–7.7)
Neutrophils Relative %: 48 %
Platelet Count: 723 10*3/uL — ABNORMAL HIGH (ref 150–400)
RBC: 3.3 MIL/uL — ABNORMAL LOW (ref 4.22–5.81)
RDW: 27.8 % — ABNORMAL HIGH (ref 11.5–15.5)
WBC Count: 6.7 10*3/uL (ref 4.0–10.5)
nRBC: 0 % (ref 0.0–0.2)

## 2018-02-26 MED ORDER — EPOETIN ALFA 20000 UNIT/ML IJ SOLN
40000.0000 [IU] | Freq: Once | INTRAMUSCULAR | Status: AC
  Administered 2018-02-26: 40000 [IU] via SUBCUTANEOUS

## 2018-02-26 MED ORDER — EPOETIN ALFA 40000 UNIT/ML IJ SOLN
INTRAMUSCULAR | Status: AC
  Filled 2018-02-26: qty 1

## 2018-03-05 ENCOUNTER — Inpatient Hospital Stay: Payer: Medicare Other

## 2018-03-05 ENCOUNTER — Telehealth: Payer: Self-pay | Admitting: Internal Medicine

## 2018-03-05 VITALS — BP 125/87 | HR 71 | Resp 18

## 2018-03-05 DIAGNOSIS — D469 Myelodysplastic syndrome, unspecified: Secondary | ICD-10-CM

## 2018-03-05 LAB — CBC WITH DIFFERENTIAL (CANCER CENTER ONLY)
ABS IMMATURE GRANULOCYTES: 0.03 10*3/uL (ref 0.00–0.07)
BASOS PCT: 2 %
Basophils Absolute: 0.2 10*3/uL — ABNORMAL HIGH (ref 0.0–0.1)
EOS PCT: 15 %
Eosinophils Absolute: 1.1 10*3/uL — ABNORMAL HIGH (ref 0.0–0.5)
HCT: 31.6 % — ABNORMAL LOW (ref 39.0–52.0)
Hemoglobin: 10.3 g/dL — ABNORMAL LOW (ref 13.0–17.0)
Immature Granulocytes: 0 %
Lymphocytes Relative: 24 %
Lymphs Abs: 1.7 10*3/uL (ref 0.7–4.0)
MCH: 32.2 pg (ref 26.0–34.0)
MCHC: 32.6 g/dL (ref 30.0–36.0)
MCV: 98.8 fL (ref 80.0–100.0)
MONO ABS: 0.7 10*3/uL (ref 0.1–1.0)
Monocytes Relative: 10 %
Neutro Abs: 3.3 10*3/uL (ref 1.7–7.7)
Neutrophils Relative %: 49 %
PLATELETS: 695 10*3/uL — AB (ref 150–400)
RBC: 3.2 MIL/uL — AB (ref 4.22–5.81)
RDW: 28.3 % — ABNORMAL HIGH (ref 11.5–15.5)
WBC: 7.1 10*3/uL (ref 4.0–10.5)
nRBC: 0.3 % — ABNORMAL HIGH (ref 0.0–0.2)

## 2018-03-05 LAB — CMP (CANCER CENTER ONLY)
ALBUMIN: 3.8 g/dL (ref 3.5–5.0)
ALK PHOS: 73 U/L (ref 38–126)
ALT: 46 U/L — AB (ref 0–44)
AST: 23 U/L (ref 15–41)
Anion gap: 7 (ref 5–15)
BILIRUBIN TOTAL: 0.8 mg/dL (ref 0.3–1.2)
BUN: 15 mg/dL (ref 8–23)
CALCIUM: 8.8 mg/dL — AB (ref 8.9–10.3)
CO2: 27 mmol/L (ref 22–32)
CREATININE: 1.01 mg/dL (ref 0.61–1.24)
Chloride: 109 mmol/L (ref 98–111)
GFR, Est AFR Am: >60 mL/min (ref >60)
GLUCOSE: 140 mg/dL — AB (ref 70–99)
Potassium: 4.6 mmol/L (ref 3.5–5.1)
Sodium: 143 mmol/L (ref 135–145)
TOTAL PROTEIN: 6.5 g/dL (ref 6.5–8.1)

## 2018-03-05 MED ORDER — EPOETIN ALFA 20000 UNIT/ML IJ SOLN
40000.0000 [IU] | Freq: Once | INTRAMUSCULAR | Status: AC
  Administered 2018-03-05: 40000 [IU] via SUBCUTANEOUS

## 2018-03-05 MED ORDER — EPOETIN ALFA 40000 UNIT/ML IJ SOLN
INTRAMUSCULAR | Status: AC
  Filled 2018-03-05: qty 1

## 2018-03-05 NOTE — Patient Instructions (Signed)
Epoetin Alfa injection °What is this medicine? °EPOETIN ALFA (e POE e tin AL fa) helps your body make more red blood cells. This medicine is used to treat anemia caused by chronic kidney failure, cancer chemotherapy, or HIV-therapy. It may also be used before surgery if you have anemia. °This medicine may be used for other purposes; ask your health care provider or pharmacist if you have questions. °COMMON BRAND NAME(S): Epogen, Procrit °What should I tell my health care provider before I take this medicine? °They need to know if you have any of these conditions: °-blood clotting disorders °-cancer patient not on chemotherapy °-cystic fibrosis °-heart disease, such as angina or heart failure °-hemoglobin level of 12 g/dL or greater °-high blood pressure °-low levels of folate, iron, or vitamin B12 °-seizures °-an unusual or allergic reaction to erythropoietin, albumin, benzyl alcohol, hamster proteins, other medicines, foods, dyes, or preservatives °-pregnant or trying to get pregnant °-breast-feeding °How should I use this medicine? °This medicine is for injection into a vein or under the skin. It is usually given by a health care professional in a hospital or clinic setting. °If you get this medicine at home, you will be taught how to prepare and give this medicine. Use exactly as directed. Take your medicine at regular intervals. Do not take your medicine more often than directed. °It is important that you put your used needles and syringes in a special sharps container. Do not put them in a trash can. If you do not have a sharps container, call your pharmacist or healthcare provider to get one. °A special MedGuide will be given to you by the pharmacist with each prescription and refill. Be sure to read this information carefully each time. °Talk to your pediatrician regarding the use of this medicine in children. While this drug may be prescribed for selected conditions, precautions do apply. °Overdosage: If you  think you have taken too much of this medicine contact a poison control center or emergency room at once. °NOTE: This medicine is only for you. Do not share this medicine with others. °What if I miss a dose? °If you miss a dose, take it as soon as you can. If it is almost time for your next dose, take only that dose. Do not take double or extra doses. °What may interact with this medicine? °Do not take this medicine with any of the following medications: °-darbepoetin alfa °This list may not describe all possible interactions. Give your health care provider a list of all the medicines, herbs, non-prescription drugs, or dietary supplements you use. Also tell them if you smoke, drink alcohol, or use illegal drugs. Some items may interact with your medicine. °What should I watch for while using this medicine? °Your condition will be monitored carefully while you are receiving this medicine. °You may need blood work done while you are taking this medicine. °What side effects may I notice from receiving this medicine? °Side effects that you should report to your doctor or health care professional as soon as possible: °-allergic reactions like skin rash, itching or hives, swelling of the face, lips, or tongue °-breathing problems °-changes in vision °-chest pain °-confusion, trouble speaking or understanding °-feeling faint or lightheaded, falls °-high blood pressure °-muscle aches or pains °-pain, swelling, warmth in the leg °-rapid weight gain °-severe headaches °-sudden numbness or weakness of the face, arm or leg °-trouble walking, dizziness, loss of balance or coordination °-seizures (convulsions) °-swelling of the ankles, feet, hands °-unusually weak or tired °  Side effects that usually do not require medical attention (report to your doctor or health care professional if they continue or are bothersome): °-diarrhea °-fever, chills (flu-like symptoms) °-headaches °-nausea, vomiting °-redness, stinging, or swelling at  site where injected °This list may not describe all possible side effects. Call your doctor for medical advice about side effects. You may report side effects to FDA at 1-800-FDA-1088. °Where should I keep my medicine? °Keep out of the reach of children. °Store in a refrigerator between 2 and 8 degrees C (36 and 46 degrees F). Do not freeze or shake. Throw away any unused portion if using a single-dose vial. Multi-dose vials can be kept in the refrigerator for up to 21 days after the initial dose. Throw away unused medicine. °NOTE: This sheet is a summary. It may not cover all possible information. If you have questions about this medicine, talk to your doctor, pharmacist, or health care provider. °© 2018 [employer] (2015-11-16 19:42:31) ° °

## 2018-03-05 NOTE — Telephone Encounter (Signed)
Scheduled appt per 11/25 sch message - pt is aware of appt date and time   

## 2018-03-12 ENCOUNTER — Inpatient Hospital Stay: Payer: Medicare Other | Attending: Internal Medicine

## 2018-03-12 ENCOUNTER — Inpatient Hospital Stay: Payer: Medicare Other

## 2018-03-12 DIAGNOSIS — R5383 Other fatigue: Secondary | ICD-10-CM | POA: Diagnosis not present

## 2018-03-12 DIAGNOSIS — Z8546 Personal history of malignant neoplasm of prostate: Secondary | ICD-10-CM | POA: Insufficient documentation

## 2018-03-12 DIAGNOSIS — Z7982 Long term (current) use of aspirin: Secondary | ICD-10-CM | POA: Diagnosis not present

## 2018-03-12 DIAGNOSIS — D469 Myelodysplastic syndrome, unspecified: Secondary | ICD-10-CM | POA: Insufficient documentation

## 2018-03-12 DIAGNOSIS — D473 Essential (hemorrhagic) thrombocythemia: Secondary | ICD-10-CM | POA: Diagnosis not present

## 2018-03-12 DIAGNOSIS — Z7902 Long term (current) use of antithrombotics/antiplatelets: Secondary | ICD-10-CM | POA: Diagnosis not present

## 2018-03-12 DIAGNOSIS — Z79899 Other long term (current) drug therapy: Secondary | ICD-10-CM | POA: Diagnosis not present

## 2018-03-12 LAB — CMP (CANCER CENTER ONLY)
ALBUMIN: 4.3 g/dL (ref 3.5–5.0)
ALK PHOS: 80 U/L (ref 38–126)
ALT: 51 U/L — ABNORMAL HIGH (ref 0–44)
AST: 24 U/L (ref 15–41)
Anion gap: 6 (ref 5–15)
BILIRUBIN TOTAL: 1.3 mg/dL — AB (ref 0.3–1.2)
BUN: 18 mg/dL (ref 8–23)
CO2: 27 mmol/L (ref 22–32)
CREATININE: 1.06 mg/dL (ref 0.61–1.24)
Calcium: 9.6 mg/dL (ref 8.9–10.3)
Chloride: 108 mmol/L (ref 98–111)
GFR, Est AFR Am: >60 mL/min (ref >60)
GLUCOSE: 106 mg/dL — AB (ref 70–99)
Potassium: 4.3 mmol/L (ref 3.5–5.1)
Sodium: 141 mmol/L (ref 135–145)
TOTAL PROTEIN: 7.5 g/dL (ref 6.5–8.1)

## 2018-03-12 LAB — CBC WITH DIFFERENTIAL (CANCER CENTER ONLY)
ABS IMMATURE GRANULOCYTES: 0.05 10*3/uL (ref 0.00–0.07)
BASOS PCT: 2 %
Basophils Absolute: 0.2 10*3/uL — ABNORMAL HIGH (ref 0.0–0.1)
EOS ABS: 0.6 10*3/uL — AB (ref 0.0–0.5)
Eosinophils Relative: 8 %
HCT: 34.7 % — ABNORMAL LOW (ref 39.0–52.0)
Hemoglobin: 11.3 g/dL — ABNORMAL LOW (ref 13.0–17.0)
Immature Granulocytes: 1 %
Lymphocytes Relative: 18 %
Lymphs Abs: 1.4 10*3/uL (ref 0.7–4.0)
MCH: 32.5 pg (ref 26.0–34.0)
MCHC: 32.6 g/dL (ref 30.0–36.0)
MCV: 99.7 fL (ref 80.0–100.0)
MONO ABS: 1.1 10*3/uL — AB (ref 0.1–1.0)
MONOS PCT: 14 %
Neutro Abs: 4.7 10*3/uL (ref 1.7–7.7)
Neutrophils Relative %: 57 %
PLATELETS: 811 10*3/uL — AB (ref 150–400)
RBC: 3.48 MIL/uL — AB (ref 4.22–5.81)
RDW: 28.8 % — AB (ref 11.5–15.5)
WBC: 8.1 10*3/uL (ref 4.0–10.5)
nRBC: 0.2 % (ref 0.0–0.2)

## 2018-03-12 MED ORDER — EPOETIN ALFA 40000 UNIT/ML IJ SOLN
INTRAMUSCULAR | Status: AC
  Filled 2018-03-12: qty 1

## 2018-03-12 NOTE — Progress Notes (Signed)
Pt did not meet parameters for injection. Hgb 11.3. Lab results were given to pt and will return for next appt. Porsche Cates LPN

## 2018-03-13 ENCOUNTER — Telehealth: Payer: Self-pay | Admitting: Medical Oncology

## 2018-03-13 ENCOUNTER — Other Ambulatory Visit: Payer: Self-pay | Admitting: Medical Oncology

## 2018-03-13 ENCOUNTER — Other Ambulatory Visit: Payer: Self-pay | Admitting: Internal Medicine

## 2018-03-13 DIAGNOSIS — D469 Myelodysplastic syndrome, unspecified: Secondary | ICD-10-CM

## 2018-03-13 MED ORDER — ANAGRELIDE HCL 1 MG PO CAPS: 1.0000 mg | ORAL_CAPSULE | Freq: Every day | ORAL | 0 refills | Status: DC

## 2018-03-13 NOTE — Telephone Encounter (Signed)
err

## 2018-03-13 NOTE — Telephone Encounter (Signed)
-----   Message from Curt Bears, MD sent at 03/12/2018 10:46 PM EST -----  Platelets count are much higher than previous.  He may need to increase his dose of anagrelide to twice daily for the next 1-2 weeks.  Thank you. ----- Message ----- From: Buel Ream, Lab In [place] Sent: 03/12/2018  10:48 AM EST To: Curt Bears, MD

## 2018-03-13 NOTE — Telephone Encounter (Signed)
Per his conversation with [facility] they told pt not to take Anagrelide. He cannot get 0.5 mg tablets of anagrelide . His last dose was 4 days ago. He has 1 mg capsules so I told him to take 1 mg day.

## 2018-03-19 ENCOUNTER — Inpatient Hospital Stay: Payer: Medicare Other

## 2018-03-19 DIAGNOSIS — D469 Myelodysplastic syndrome, unspecified: Secondary | ICD-10-CM | POA: Diagnosis not present

## 2018-03-19 LAB — CBC WITH DIFFERENTIAL (CANCER CENTER ONLY)
Abs Immature Granulocytes: 0.02 10*3/uL (ref 0.00–0.07)
Basophils Absolute: 0.1 10*3/uL (ref 0.0–0.1)
Basophils Relative: 2 %
Eosinophils Absolute: 0.7 10*3/uL — ABNORMAL HIGH (ref 0.0–0.5)
Eosinophils Relative: 11 %
HCT: 33.6 % — ABNORMAL LOW (ref 39.0–52.0)
HEMOGLOBIN: 11 g/dL — AB (ref 13.0–17.0)
Immature Granulocytes: 0 %
Lymphocytes Relative: 24 %
Lymphs Abs: 1.6 10*3/uL (ref 0.7–4.0)
MCH: 32.8 pg (ref 26.0–34.0)
MCHC: 32.7 g/dL (ref 30.0–36.0)
MCV: 100.3 fL — ABNORMAL HIGH (ref 80.0–100.0)
MONOS PCT: 9 %
Monocytes Absolute: 0.6 10*3/uL (ref 0.1–1.0)
Neutro Abs: 3.5 10*3/uL (ref 1.7–7.7)
Neutrophils Relative %: 54 %
Platelet Count: 641 10*3/uL — ABNORMAL HIGH (ref 150–400)
RBC: 3.35 MIL/uL — ABNORMAL LOW (ref 4.22–5.81)
RDW: 27.5 % — ABNORMAL HIGH (ref 11.5–15.5)
WBC Count: 6.5 10*3/uL (ref 4.0–10.5)
nRBC: 0 % (ref 0.0–0.2)

## 2018-03-19 LAB — CMP (CANCER CENTER ONLY)
ALT: 33 U/L (ref 0–44)
AST: 19 U/L (ref 15–41)
Albumin: 4 g/dL (ref 3.5–5.0)
Alkaline Phosphatase: 75 U/L (ref 38–126)
Anion gap: 8 (ref 5–15)
BUN: 18 mg/dL (ref 8–23)
CO2: 27 mmol/L (ref 22–32)
Calcium: 9.2 mg/dL (ref 8.9–10.3)
Chloride: 108 mmol/L (ref 98–111)
Creatinine: 0.93 mg/dL (ref 0.61–1.24)
GFR, Estimated: >60 mL/min (ref >60)
Glucose, Bld: 94 mg/dL (ref 70–99)
Potassium: 4.6 mmol/L (ref 3.5–5.1)
SODIUM: 143 mmol/L (ref 135–145)
TOTAL PROTEIN: 7.1 g/dL (ref 6.5–8.1)
Total Bilirubin: 1.4 mg/dL — ABNORMAL HIGH (ref 0.3–1.2)

## 2018-03-19 NOTE — Progress Notes (Signed)
PTs HGB 11.0 today. NO injection needed. Went to inform patient in lobby but he did not respond when I called his name.

## 2018-03-22 ENCOUNTER — Inpatient Hospital Stay: Payer: Medicare Other

## 2018-03-22 ENCOUNTER — Encounter: Payer: Self-pay | Admitting: Internal Medicine

## 2018-03-22 ENCOUNTER — Inpatient Hospital Stay (HOSPITAL_BASED_OUTPATIENT_CLINIC_OR_DEPARTMENT_OTHER): Payer: Medicare Other | Admitting: Internal Medicine

## 2018-03-22 VITALS — HR 78 | Temp 98.1°F | Resp 17 | Ht 68.0 in | Wt 163.2 lb

## 2018-03-22 DIAGNOSIS — D6481 Anemia due to antineoplastic chemotherapy: Secondary | ICD-10-CM

## 2018-03-22 DIAGNOSIS — D75839 Thrombocytosis, unspecified: Secondary | ICD-10-CM

## 2018-03-22 DIAGNOSIS — Z7902 Long term (current) use of antithrombotics/antiplatelets: Secondary | ICD-10-CM

## 2018-03-22 DIAGNOSIS — D469 Myelodysplastic syndrome, unspecified: Secondary | ICD-10-CM

## 2018-03-22 DIAGNOSIS — Z79899 Other long term (current) drug therapy: Secondary | ICD-10-CM

## 2018-03-22 DIAGNOSIS — R5383 Other fatigue: Secondary | ICD-10-CM | POA: Diagnosis not present

## 2018-03-22 DIAGNOSIS — D473 Essential (hemorrhagic) thrombocythemia: Secondary | ICD-10-CM | POA: Diagnosis not present

## 2018-03-22 DIAGNOSIS — T451X5A Adverse effect of antineoplastic and immunosuppressive drugs, initial encounter: Secondary | ICD-10-CM

## 2018-03-22 DIAGNOSIS — Z7982 Long term (current) use of aspirin: Secondary | ICD-10-CM

## 2018-03-22 DIAGNOSIS — Z8546 Personal history of malignant neoplasm of prostate: Secondary | ICD-10-CM

## 2018-03-22 LAB — CMP (CANCER CENTER ONLY)
ALT: 33 U/L (ref 0–44)
AST: 19 U/L (ref 15–41)
Albumin: 4 g/dL (ref 3.5–5.0)
Alkaline Phosphatase: 79 U/L (ref 38–126)
Anion gap: 7 (ref 5–15)
BUN: 16 mg/dL (ref 8–23)
CO2: 28 mmol/L (ref 22–32)
Calcium: 9.5 mg/dL (ref 8.9–10.3)
Chloride: 107 mmol/L (ref 98–111)
Creatinine: 0.92 mg/dL (ref 0.61–1.24)
GFR, Est AFR Am: >60 mL/min
GFR, Estimated: >60 mL/min
Glucose, Bld: 92 mg/dL (ref 70–99)
Potassium: 4.5 mmol/L (ref 3.5–5.1)
Sodium: 142 mmol/L (ref 135–145)
Total Bilirubin: 1.1 mg/dL (ref 0.3–1.2)
Total Protein: 7.3 g/dL (ref 6.5–8.1)

## 2018-03-22 LAB — CBC WITH DIFFERENTIAL (CANCER CENTER ONLY)
Abs Immature Granulocytes: 0.03 K/uL (ref 0.00–0.07)
Basophils Absolute: 0.2 K/uL — ABNORMAL HIGH (ref 0.0–0.1)
Basophils Relative: 2 %
Eosinophils Absolute: 0.9 K/uL — ABNORMAL HIGH (ref 0.0–0.5)
Eosinophils Relative: 11 %
HCT: 34.5 % — ABNORMAL LOW (ref 39.0–52.0)
Hemoglobin: 11.2 g/dL — ABNORMAL LOW (ref 13.0–17.0)
Immature Granulocytes: 0 %
Lymphocytes Relative: 22 %
Lymphs Abs: 1.7 K/uL (ref 0.7–4.0)
MCH: 32.5 pg (ref 26.0–34.0)
MCHC: 32.5 g/dL (ref 30.0–36.0)
MCV: 100 fL (ref 80.0–100.0)
Monocytes Absolute: 0.7 K/uL (ref 0.1–1.0)
Monocytes Relative: 9 %
Neutro Abs: 4.4 K/uL (ref 1.7–7.7)
Neutrophils Relative %: 56 %
Platelet Count: 575 K/uL — ABNORMAL HIGH (ref 150–400)
RBC: 3.45 MIL/uL — ABNORMAL LOW (ref 4.22–5.81)
RDW: 28.1 % — ABNORMAL HIGH (ref 11.5–15.5)
WBC Count: 7.8 K/uL (ref 4.0–10.5)
nRBC: 0 % (ref 0.0–0.2)

## 2018-03-22 NOTE — Progress Notes (Signed)
[facility]  Telephone:([phone]) [phone] Fax:([phone]) [phone]  OFFICE PROGRESS NOTE  [name], MD [address] [place] [address]  DIAGNOSIS: 1.  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 Essential thrombocythemia with positive JAK2 mutation 3. Leukocytosis.   PRIOR THERAPY: Previous treatment with combination of Hydrea and anagrelide.  CURRENT THERAPY:  1) Revlimid 5 mg by mouth daily.  Started 03/25/2016.  Status post 24 cycles. 2) anagrelide 0.5 mg by mouth daily 3) Aranesp 300 mcg subcutaneously every 3 weeks.  This is switched to Procrit 400 mcg subcutaneously every 2 weeks starting April 13, 2017.  The frequency of the Procrit was changed to weekly schedule when he was in [place].  INTERVAL HISTORY: Roy Johnson 72 y.o. male returns to the clinic today for follow-up visit.  The patient is feeling fine today with no concerning complaints except for occasional pain on the lateral side of his legs.  He denied having any nausea, vomiting, diarrhea or constipation.  He continues to complain of fatigue.  He has no chest pain, shortness of breath, cough or hemoptysis.  He has no headache or visual changes.  He denied having any significant weight loss or night sweats.  He is currently on Revlimid and also recently started on anagrelide 1 mg p.o. daily because of the elevated platelets count.  The patient is also on treatment with Procrit weekly.  He is here today for evaluation and repeat blood work.   MEDICAL HISTORY: Past Medical History:  Diagnosis Date  . Anemia   . Colon polyp   . Diverticulitis   . Diverticulosis   . GERD (gastroesophageal reflux disease)   . Hyperlipidemia   . IBS (irritable bowel syndrome)   . Inguinal hernia   . 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  . Prostate cancer ([place])                . Prostate cancer ([place])   . Umbilical hernia   . Vitamin D deficiency      ALLERGIES:  has No Known Allergies.  MEDICATIONS:  Current Outpatient Medications  Medication Sig Dispense Refill  . amLODipine (NORVASC) 5 MG tablet Take 5 mg by mouth daily.  2  . amphetamine-dextroamphetamine (ADDERALL) 10 MG tablet Take 1 tablet (10 mg total) by mouth daily with breakfast. 30 tablet 0  . anagrelide (AGRYLIN) 1 MG capsule TAKE 1 CAPSULE(1 MG) BY MOUTH DAILY 90 capsule 0  . aspirin EC 81 MG tablet Take 81 mg by mouth.    Marland Kitchen epoetin alfa-epbx (RETACRIT) 30865 UNIT/ML injection Inject into the skin.    Marland Kitchen gabapentin (NEURONTIN) 100 MG capsule One capsule twice a day, 2 at night 120 capsule 3  . lenalidomide (REVLIMID) 5 MG capsule Take 5 mg by mouth daily.    . Multiple Vitamin (MULTI-VITAMINS) TABS Take 1 tablet by mouth daily.    . Polyethylene Glycol 3350 (PEG 3350) POWD nightly.    . vitamin B-12 (CYANOCOBALAMIN) 1000 MCG tablet Take 1 tablet by mouth daily as needed.     No current facility-administered medications for this visit.    Facility-Administered Medications Ordered in Other Visits  Medication Dose Route Frequency Provider Last Rate Last Dose  . 0.9 %  sodium chloride infusion  250 mL Intravenous Once Curt Bears, MD        SURGICAL HISTORY:  Past Surgical History:  Procedure Laterality Date  . COLONOSCOPY    . POLYPECTOMY    . PROSTATECTOMY    .  TONSILLECTOMY      REVIEW OF SYSTEMS:  A comprehensive review of systems was negative except for: Constitutional: positive for fatigue   PHYSICAL EXAMINATION: General appearance: alert, cooperative, appears stated age, fatigued and no distress Head: Normocephalic, without obvious abnormality, atraumatic Neck: no adenopathy, no JVD, supple, symmetrical, trachea midline and thyroid not enlarged, symmetric, no tenderness/mass/nodules Lymph nodes: Cervical, supraclavicular, and axillary nodes normal. Resp: clear to auscultation bilaterally Back: symmetric, no curvature. ROM normal. No CVA  tenderness. Cardio: regular rate and rhythm, S1, S2 normal, no murmur, click, rub or gallop GI: soft, non-tender; bowel sounds normal; no masses,  no organomegaly Extremities: extremities normal, atraumatic, no cyanosis or edema  ECOG PERFORMANCE STATUS: 1 - Symptomatic but completely ambulatory  Pulse 78, temperature 98.1 F (36.7 C), temperature source Oral, resp. rate 17, height 5\' 8"  (1.727 m), weight 163 lb 3.2 oz (74 kg), SpO2 100 %.  LABORATORY DATA: Lab Results  Component Value Date   WBC 7.8 03/22/2018   HGB 11.2 (L) 03/22/2018   HCT 34.5 (L) 03/22/2018   MCV 100.0 03/22/2018   PLT 575 (H) 03/22/2018      Chemistry      Component Value Date/Time   NA 143 03/19/2018 0914   NA 141 12/08/2016 0928   K 4.6 03/19/2018 0914   K 4.5 12/08/2016 0928   CL 108 03/19/2018 0914   CO2 27 03/19/2018 0914   CO2 27 12/08/2016 0928   BUN 18 03/19/2018 0914   BUN 14.1 12/08/2016 0928   CREATININE 0.93 03/19/2018 0914   CREATININE 0.9 12/08/2016 0928      Component Value Date/Time   CALCIUM 9.2 03/19/2018 0914   CALCIUM 9.0 12/08/2016 0928   ALKPHOS 75 03/19/2018 0914   ALKPHOS 87 12/08/2016 0928   AST 19 03/19/2018 0914   AST 25 12/08/2016 0928   ALT 33 03/19/2018 0914   ALT 41 12/08/2016 0928   BILITOT 1.4 (H) 03/19/2018 0914   BILITOT 0.84 12/08/2016 0928      RADIOGRAPHIC STUDIES:  ASSESSMENT AND PLAN:  This is a very pleasant 72 years old white male with essential thrombocythemia with positive JAK-2 mutation as well as myelodysplastic syndrome. He is currently on treatment with Revlimid 5 mg by mouth daily according to the recommendation from [facility].  He is also on treatment with Aranesp every 3 weeks.  Aranesp was a change it to Procrit initially every 2 weeks and currently on weekly basis.  He mentioned that he is feeling much better on Procrit weekly  He has been on the weekly treatment with Procrit for the last 6 months.  He is feeling very well and his  hemoglobin has been in the range of 10-11 G/DL. I recommended for the patient to continue his current treatment with Procrit with the same dose. He is traveling back to [place] in January 2020 and will be back to [place] in May 2020.  I will see him back for follow-up visit at that time.  He has a hematologist in [place] during his stay there. He was advised to call if he has any concerning symptoms in the interval. All questions were answered. The patient knows to call the clinic with any problems, questions or concerns. We can certainly see the patient much sooner if necessary. I spent 10 minutes counseling the patient face to face. The total time spent in the appointment was 15 minutes.  Disclaimer: This note was dictated with voice recognition software. Similar sounding words  can inadvertently be transcribed and may not be corrected upon review.

## 2018-03-26 ENCOUNTER — Encounter: Payer: Self-pay | Admitting: Internal Medicine

## 2018-03-28 ENCOUNTER — Telehealth: Payer: Self-pay

## 2018-03-28 ENCOUNTER — Encounter: Payer: Self-pay | Admitting: Medical Oncology

## 2018-03-28 NOTE — Telephone Encounter (Signed)
Error opening  

## 2018-03-28 NOTE — Telephone Encounter (Signed)
Per 12/18 sch msg Patient came In and was scheduled for labs, and injections. Mychart

## 2018-04-02 ENCOUNTER — Inpatient Hospital Stay: Payer: Medicare Other

## 2018-04-02 VITALS — BP 156/76 | HR 77 | Resp 18

## 2018-04-02 DIAGNOSIS — D469 Myelodysplastic syndrome, unspecified: Secondary | ICD-10-CM

## 2018-04-02 LAB — CBC WITH DIFFERENTIAL (CANCER CENTER ONLY)
Abs Immature Granulocytes: 0.03 10*3/uL (ref 0.00–0.07)
Basophils Absolute: 0.2 10*3/uL — ABNORMAL HIGH (ref 0.0–0.1)
Basophils Relative: 2 %
Eosinophils Absolute: 1 10*3/uL — ABNORMAL HIGH (ref 0.0–0.5)
Eosinophils Relative: 12 %
HCT: 30.4 % — ABNORMAL LOW (ref 39.0–52.0)
HEMOGLOBIN: 10 g/dL — AB (ref 13.0–17.0)
Immature Granulocytes: 0 %
Lymphocytes Relative: 20 %
Lymphs Abs: 1.6 10*3/uL (ref 0.7–4.0)
MCH: 32.5 pg (ref 26.0–34.0)
MCHC: 32.9 g/dL (ref 30.0–36.0)
MCV: 98.7 fL (ref 80.0–100.0)
MONO ABS: 0.9 10*3/uL (ref 0.1–1.0)
MONOS PCT: 12 %
Neutro Abs: 4.2 10*3/uL (ref 1.7–7.7)
Neutrophils Relative %: 54 %
Platelet Count: 746 10*3/uL — ABNORMAL HIGH (ref 150–400)
RBC: 3.08 MIL/uL — ABNORMAL LOW (ref 4.22–5.81)
RDW: 27.5 % — ABNORMAL HIGH (ref 11.5–15.5)
WBC Count: 7.9 10*3/uL (ref 4.0–10.5)
nRBC: 0 % (ref 0.0–0.2)

## 2018-04-02 LAB — CMP (CANCER CENTER ONLY)
ALK PHOS: 87 U/L (ref 38–126)
ALT: 30 U/L (ref 0–44)
AST: 16 U/L (ref 15–41)
Albumin: 3.9 g/dL (ref 3.5–5.0)
Anion gap: 6 (ref 5–15)
BUN: 20 mg/dL (ref 8–23)
CALCIUM: 9 mg/dL (ref 8.9–10.3)
CO2: 26 mmol/L (ref 22–32)
Chloride: 110 mmol/L (ref 98–111)
Creatinine: 0.9 mg/dL (ref 0.61–1.24)
GFR, Est AFR Am: >60 mL/min (ref >60)
GFR, Estimated: >60 mL/min (ref >60)
GLUCOSE: 105 mg/dL — AB (ref 70–99)
Potassium: 4.4 mmol/L (ref 3.5–5.1)
Sodium: 142 mmol/L (ref 135–145)
Total Bilirubin: 0.7 mg/dL (ref 0.3–1.2)
Total Protein: 6.9 g/dL (ref 6.5–8.1)

## 2018-04-02 MED ORDER — EPOETIN ALFA 20000 UNIT/ML IJ SOLN
40000.0000 [IU] | Freq: Once | INTRAMUSCULAR | Status: AC
  Administered 2018-04-02: 40000 [IU] via SUBCUTANEOUS

## 2018-04-09 ENCOUNTER — Inpatient Hospital Stay: Payer: Medicare Other

## 2018-04-09 VITALS — BP 137/89 | HR 74 | Temp 98.5°F | Resp 18

## 2018-04-09 DIAGNOSIS — D469 Myelodysplastic syndrome, unspecified: Secondary | ICD-10-CM | POA: Diagnosis not present

## 2018-04-09 LAB — CMP (CANCER CENTER ONLY)
ALT: 35 U/L (ref 0–44)
AST: 21 U/L (ref 15–41)
Albumin: 4 g/dL (ref 3.5–5.0)
Alkaline Phosphatase: 73 U/L (ref 38–126)
Anion gap: 9 (ref 5–15)
BUN: 14 mg/dL (ref 8–23)
CALCIUM: 9.2 mg/dL (ref 8.9–10.3)
CO2: 24 mmol/L (ref 22–32)
Chloride: 109 mmol/L (ref 98–111)
Creatinine: 0.99 mg/dL (ref 0.61–1.24)
GFR, Est AFR Am: >60 mL/min (ref >60)
GFR, Estimated: >60 mL/min (ref >60)
Glucose, Bld: 131 mg/dL — ABNORMAL HIGH (ref 70–99)
Potassium: 4.3 mmol/L (ref 3.5–5.1)
Sodium: 142 mmol/L (ref 135–145)
Total Bilirubin: 0.8 mg/dL (ref 0.3–1.2)
Total Protein: 7.1 g/dL (ref 6.5–8.1)

## 2018-04-09 LAB — CBC WITH DIFFERENTIAL (CANCER CENTER ONLY)
Abs Immature Granulocytes: 0.05 10*3/uL (ref 0.00–0.07)
Basophils Absolute: 0.1 10*3/uL (ref 0.0–0.1)
Basophils Relative: 2 %
EOS PCT: 3 %
Eosinophils Absolute: 0.2 10*3/uL (ref 0.0–0.5)
HCT: 32.4 % — ABNORMAL LOW (ref 39.0–52.0)
Hemoglobin: 10.5 g/dL — ABNORMAL LOW (ref 13.0–17.0)
Immature Granulocytes: 1 %
Lymphocytes Relative: 19 %
Lymphs Abs: 1.4 10*3/uL (ref 0.7–4.0)
MCH: 32.6 pg (ref 26.0–34.0)
MCHC: 32.4 g/dL (ref 30.0–36.0)
MCV: 100.6 fL — ABNORMAL HIGH (ref 80.0–100.0)
MONO ABS: 0.7 10*3/uL (ref 0.1–1.0)
Monocytes Relative: 10 %
Neutro Abs: 4.8 10*3/uL (ref 1.7–7.7)
Neutrophils Relative %: 65 %
Platelet Count: 541 10*3/uL — ABNORMAL HIGH (ref 150–400)
RBC: 3.22 MIL/uL — ABNORMAL LOW (ref 4.22–5.81)
RDW: 27.9 % — AB (ref 11.5–15.5)
WBC Count: 7.3 10*3/uL (ref 4.0–10.5)
nRBC: 0.4 % — ABNORMAL HIGH (ref 0.0–0.2)

## 2018-04-09 MED ORDER — EPOETIN ALFA 40000 UNIT/ML IJ SOLN
INTRAMUSCULAR | Status: AC
  Filled 2018-04-09: qty 1

## 2018-04-09 MED ORDER — EPOETIN ALFA 40000 UNIT/ML IJ SOLN
40000.0000 [IU] | Freq: Once | INTRAMUSCULAR | Status: AC
  Administered 2018-04-09: 40000 [IU] via SUBCUTANEOUS

## 2018-04-09 NOTE — Patient Instructions (Signed)
Epoetin Alfa injection °What is this medicine? °EPOETIN ALFA (e POE e tin AL fa) helps your body make more red blood cells. This medicine is used to treat anemia caused by chronic kidney disease, cancer chemotherapy, or HIV-therapy. It may also be used before surgery if you have anemia. °This medicine may be used for other purposes; ask your health care provider or pharmacist if you have questions. °COMMON BRAND NAME(S): Epogen, Procrit, Retacrit °What should I tell my health care provider before I take this medicine? °They need to know if you have any of these conditions: °-cancer °-heart disease °-high blood pressure °-history of blood clots °-history of stroke °-low levels of folate, iron, or vitamin B12 in the blood °-seizures °-an unusual or allergic reaction to erythropoietin, albumin, benzyl alcohol, hamster proteins, other medicines, foods, dyes, or preservatives °-pregnant or trying to get pregnant °-breast-feeding °How should I use this medicine? °This medicine is for injection into a vein or under the skin. It is usually given by a health care professional in a hospital or clinic setting. °If you get this medicine at home, you will be taught how to prepare and give this medicine. Use exactly as directed. Take your medicine at regular intervals. Do not take your medicine more often than directed. °It is important that you put your used needles and syringes in a special sharps container. Do not put them in a trash can. If you do not have a sharps container, call your pharmacist or healthcare provider to get one. °A special MedGuide will be given to you by the pharmacist with each prescription and refill. Be sure to read this information carefully each time. °Talk to your pediatrician regarding the use of this medicine in children. While this drug may be prescribed for selected conditions, precautions do apply. °Overdosage: If you think you have taken too much of this medicine contact a poison control center  or emergency room at once. °NOTE: This medicine is only for you. Do not share this medicine with others. °What if I miss a dose? °If you miss a dose, take it as soon as you can. If it is almost time for your next dose, take only that dose. Do not take double or extra doses. °What may interact with this medicine? °Interactions have not been studied. °This list may not describe all possible interactions. Give your health care provider a list of all the medicines, herbs, non-prescription drugs, or dietary supplements you use. Also tell them if you smoke, drink alcohol, or use illegal drugs. Some items may interact with your medicine. °What should I watch for while using this medicine? °Your condition will be monitored carefully while you are receiving this medicine. °You may need blood work done while you are taking this medicine. °This medicine may cause a decrease in vitamin B6. You should make sure that you get enough vitamin B6 while you are taking this medicine. Discuss the foods you eat and the vitamins you take with your health care professional. °What side effects may I notice from receiving this medicine? °Side effects that you should report to your doctor or health care professional as soon as possible: °-allergic reactions like skin rash, itching or hives, swelling of the face, lips, or tongue °-seizures °-signs and symptoms of a blood clot such as breathing problems; changes in vision; chest pain; severe, sudden headache; pain, swelling, warmth in the leg; trouble speaking; sudden numbness or weakness of the face, arm or leg °-signs and symptoms of a stroke   like changes in vision; confusion; trouble speaking or understanding; severe headaches; sudden numbness or weakness of the face, arm or leg; trouble walking; dizziness; loss of balance or coordination °Side effects that usually do not require medical attention (report to your doctor or health care professional if they continue or are  bothersome): °-chills °-cough °-dizziness °-fever °-headaches °-joint pain °-muscle cramps °-muscle pain °-nausea, vomiting °-pain, redness, or irritation at site where injected °This list may not describe all possible side effects. Call your doctor for medical advice about side effects. You may report side effects to FDA at 1-800-FDA-1088. °Where should I keep my medicine? °Keep out of the reach of children. °Store in a refrigerator between 2 and 8 degrees C (36 and 46 degrees F). Do not freeze or shake. Throw away any unused portion if using a single-dose vial. Multi-dose vials can be kept in the refrigerator for up to 21 days after the initial dose. Throw away unused medicine. °NOTE: This sheet is a summary. It may not cover all possible information. If you have questions about this medicine, talk to your doctor, pharmacist, or health care provider. °© 2019 [employer]vier/[employer] Standard (2016-11-04 08:35:19) ° °

## 2018-04-16 ENCOUNTER — Inpatient Hospital Stay: Payer: Medicare Other

## 2018-04-16 ENCOUNTER — Inpatient Hospital Stay: Payer: Medicare Other | Attending: Internal Medicine

## 2018-04-16 DIAGNOSIS — D469 Myelodysplastic syndrome, unspecified: Secondary | ICD-10-CM | POA: Diagnosis not present

## 2018-04-16 LAB — CBC WITH DIFFERENTIAL (CANCER CENTER ONLY)
Abs Immature Granulocytes: 0.09 10*3/uL — ABNORMAL HIGH (ref 0.00–0.07)
BASOS PCT: 2 %
Basophils Absolute: 0.1 10*3/uL (ref 0.0–0.1)
Eosinophils Absolute: 0.7 10*3/uL — ABNORMAL HIGH (ref 0.0–0.5)
Eosinophils Relative: 11 %
HCT: 38.3 % — ABNORMAL LOW (ref 39.0–52.0)
Hemoglobin: 12.3 g/dL — ABNORMAL LOW (ref 13.0–17.0)
Immature Granulocytes: 1 %
Lymphocytes Relative: 24 %
Lymphs Abs: 1.6 10*3/uL (ref 0.7–4.0)
MCH: 32 pg (ref 26.0–34.0)
MCHC: 32.1 g/dL (ref 30.0–36.0)
MCV: 99.7 fL (ref 80.0–100.0)
Monocytes Absolute: 0.6 10*3/uL (ref 0.1–1.0)
Monocytes Relative: 9 %
NEUTROS PCT: 53 %
Neutro Abs: 3.5 10*3/uL (ref 1.7–7.7)
Platelet Count: 358 10*3/uL (ref 150–400)
RBC: 3.84 MIL/uL — AB (ref 4.22–5.81)
RDW: 28.8 % — ABNORMAL HIGH (ref 11.5–15.5)
WBC Count: 6.6 10*3/uL (ref 4.0–10.5)
nRBC: 0.5 % — ABNORMAL HIGH (ref 0.0–0.2)

## 2018-04-16 LAB — CMP (CANCER CENTER ONLY)
ALT: 39 U/L (ref 0–44)
ANION GAP: 7 (ref 5–15)
AST: 22 U/L (ref 15–41)
Albumin: 4.1 g/dL (ref 3.5–5.0)
Alkaline Phosphatase: 78 U/L (ref 38–126)
BUN: 18 mg/dL (ref 8–23)
CO2: 26 mmol/L (ref 22–32)
Calcium: 9.3 mg/dL (ref 8.9–10.3)
Chloride: 108 mmol/L (ref 98–111)
Creatinine: 1.16 mg/dL (ref 0.61–1.24)
Glucose, Bld: 95 mg/dL (ref 70–99)
Potassium: 4.9 mmol/L (ref 3.5–5.1)
Sodium: 141 mmol/L (ref 135–145)
TOTAL PROTEIN: 7.3 g/dL (ref 6.5–8.1)
Total Bilirubin: 1.3 mg/dL — ABNORMAL HIGH (ref 0.3–1.2)

## 2018-04-16 MED ORDER — EPOETIN ALFA 20000 UNIT/ML IJ SOLN: 40000.0000 [IU] | Freq: Once | INTRAMUSCULAR | Status: DC

## 2018-04-16 MED ORDER — EPOETIN ALFA 40000 UNIT/ML IJ SOLN
INTRAMUSCULAR | Status: AC
  Filled 2018-04-16: qty 1

## 2018-04-16 NOTE — Progress Notes (Signed)
No injection today. parameters not met. HGB 12.3

## 2018-04-20 ENCOUNTER — Telehealth: Payer: Self-pay | Admitting: Internal Medicine

## 2018-04-20 NOTE — Telephone Encounter (Signed)
Called paient per 1/10 sch message - unable to reach patient - left message for patient to call back to r./s

## 2018-04-23 ENCOUNTER — Inpatient Hospital Stay: Payer: Medicare Other

## 2018-04-23 MED ORDER — EPOETIN ALFA 40000 UNIT/ML IJ SOLN
INTRAMUSCULAR | Status: AC
  Filled 2018-04-23: qty 1

## 2018-04-30 ENCOUNTER — Telehealth: Payer: Self-pay

## 2018-04-30 ENCOUNTER — Ambulatory Visit: Payer: Medicare Other

## 2018-04-30 ENCOUNTER — Other Ambulatory Visit: Payer: Medicare Other

## 2018-04-30 MED ORDER — EPOETIN ALFA 40000 UNIT/ML IJ SOLN
INTRAMUSCULAR | Status: AC
  Filled 2018-04-30: qty 1

## 2018-04-30 NOTE — Telephone Encounter (Signed)
Patient was a "No Show" for lab/injection appt today. Phone contact made. Patient states he made several attempts to call office to cancel appt's; however, he never received a return phone call from messages left. [name] states he has temporarily relocated to [place] and plans to return in "several months." States he will be receiving injections in [place]. Encouraged patient to call this office when he gets back to the area. Verbalized understanding.

## 2018-05-02 ENCOUNTER — Encounter: Payer: Self-pay | Admitting: Internal Medicine

## 2018-05-29 ENCOUNTER — Other Ambulatory Visit: Payer: Self-pay | Admitting: Internal Medicine

## 2018-05-29 DIAGNOSIS — D469 Myelodysplastic syndrome, unspecified: Secondary | ICD-10-CM

## 2018-05-30 ENCOUNTER — Encounter: Payer: Self-pay | Admitting: Internal Medicine

## 2018-05-30 ENCOUNTER — Other Ambulatory Visit: Payer: Self-pay | Admitting: Medical Oncology

## 2018-05-30 DIAGNOSIS — D469 Myelodysplastic syndrome, unspecified: Secondary | ICD-10-CM

## 2018-05-30 MED ORDER — ANAGRELIDE HCL 1 MG PO CAPS: ORAL_CAPSULE | ORAL | 0 refills | Status: DC

## 2018-05-31 ENCOUNTER — Telehealth: Payer: Self-pay | Admitting: *Deleted

## 2018-05-31 NOTE — Telephone Encounter (Signed)
Called pt.'s [facility] to confirm receipt of order.  An alternative medication order is needed.  Both Anagrelide 0.5 mg and 1 mg are on back order.  [facility] report calling [facility] and [facility], experiencing same thing.  Unable to fill thirty-day or ninety-day supply.

## 2018-05-31 NOTE — Telephone Encounter (Signed)
Received TC from patient regarding refilling his anagrelide.  He states a refill was denied @ [facility] .  Pt is in [place] at this time and normally the [employer] here notifies the [employer] where he is staying about refill request. Reviewed information available in pt's chart.  Apparently it was not filled due to a back order issue with this medication.  There is a nation-wide back order of the 0.5 mg and 1 mg capsules. Advised patient to check with the pharmacy in [place] and see if they can help locate a pharmacy that has this in stock.  TCT patient and he did speak with the pharmacy in [place] and they were bale to locate some Anagrelide for him.  He is aware that this could occur again with his next refill as there is no specific date when this medication will be generally available again other than a vague 'end of March'  from one producer of the medication.  Currently [facility] has some 0.5 mg capsules in stock.

## 2018-08-07 ENCOUNTER — Other Ambulatory Visit: Payer: Self-pay | Admitting: Medical Oncology

## 2018-08-07 DIAGNOSIS — D469 Myelodysplastic syndrome, unspecified: Secondary | ICD-10-CM

## 2018-08-07 MED ORDER — ANAGRELIDE HCL 1 MG PO CAPS: ORAL_CAPSULE | ORAL | 0 refills | Status: DC

## 2018-08-29 ENCOUNTER — Other Ambulatory Visit: Payer: Self-pay | Admitting: *Deleted

## 2018-08-29 ENCOUNTER — Telehealth: Payer: Self-pay | Admitting: *Deleted

## 2018-08-29 DIAGNOSIS — D469 Myelodysplastic syndrome, unspecified: Secondary | ICD-10-CM

## 2018-08-29 NOTE — Telephone Encounter (Signed)
Received vm message from patient regarding his return to [place] from [place] this coming Saturday and f/u appts and procrit injections.  TCT patient and spoke with him. He states he has been in [place], [place] since 04/20/18 per his usual life schedule. He is returning to [facility] this coming Saturday, 09/01/18. He needs to have appt/labs and possible procrit injection next week. He sees an Materials engineer in [place] and saw him yesterday. Pt states that he and his wife have been self quarrentined for the last several weeks d/t COVID 19 pandemic. Informed him that he may be asked to self quarantine for 2 weeks upon his return. Pt's concern is that his HGB may plummet while he waits to come in for labs.  He has been wearing a mask outside the home. Denies cough, SOB, and fever.  Please advise when we can schedule his appts.

## 2018-08-29 NOTE — Telephone Encounter (Signed)
Follow up in 2 weeks. If concerned, he can do cbc in outside facility and send it to [place] to make a decision.

## 2018-08-29 NOTE — Telephone Encounter (Signed)
TCT patient and advised that he will need to do the 2 week self quarantine upon his return from [place] on Saturday. Informed that a scheduling request has been sent for appts here on 09/17/18 Pt voiced understanding . Advised that if he felt he needed his HGB checked prior to that appt he can call us and we can send an order for CBC to an outside lab. He voiced understanding

## 2018-08-31 ENCOUNTER — Telehealth: Payer: Self-pay | Admitting: Internal Medicine

## 2018-08-31 NOTE — Telephone Encounter (Signed)
Spoke with patient per 5/20 sch message - pt is aware of appt date and time

## 2018-09-17 ENCOUNTER — Inpatient Hospital Stay: Payer: Medicare Other | Attending: Internal Medicine | Admitting: Internal Medicine

## 2018-09-17 ENCOUNTER — Other Ambulatory Visit: Payer: Self-pay

## 2018-09-17 ENCOUNTER — Inpatient Hospital Stay: Payer: Medicare Other

## 2018-09-17 ENCOUNTER — Encounter: Payer: Self-pay | Admitting: Internal Medicine

## 2018-09-17 VITALS — BP 131/76 | HR 73 | Temp 98.5°F | Resp 18 | Ht 68.0 in | Wt 164.3 lb

## 2018-09-17 DIAGNOSIS — D469 Myelodysplastic syndrome, unspecified: Secondary | ICD-10-CM

## 2018-09-17 DIAGNOSIS — Z79899 Other long term (current) drug therapy: Secondary | ICD-10-CM

## 2018-09-17 DIAGNOSIS — Z7982 Long term (current) use of aspirin: Secondary | ICD-10-CM

## 2018-09-17 DIAGNOSIS — D72829 Elevated white blood cell count, unspecified: Secondary | ICD-10-CM | POA: Diagnosis not present

## 2018-09-17 DIAGNOSIS — D473 Essential (hemorrhagic) thrombocythemia: Secondary | ICD-10-CM

## 2018-09-17 DIAGNOSIS — Z8546 Personal history of malignant neoplasm of prostate: Secondary | ICD-10-CM | POA: Diagnosis not present

## 2018-09-17 DIAGNOSIS — R1084 Generalized abdominal pain: Secondary | ICD-10-CM

## 2018-09-17 DIAGNOSIS — D75839 Thrombocytosis, unspecified: Secondary | ICD-10-CM

## 2018-09-17 DIAGNOSIS — D6481 Anemia due to antineoplastic chemotherapy: Secondary | ICD-10-CM

## 2018-09-17 LAB — CBC WITH DIFFERENTIAL (CANCER CENTER ONLY)
Abs Immature Granulocytes: 0.02 10*3/uL (ref 0.00–0.07)
Basophils Absolute: 0.2 10*3/uL — ABNORMAL HIGH (ref 0.0–0.1)
Basophils Relative: 2 %
Eosinophils Absolute: 0.7 10*3/uL — ABNORMAL HIGH (ref 0.0–0.5)
Eosinophils Relative: 9 %
HCT: 30.2 % — ABNORMAL LOW (ref 39.0–52.0)
Hemoglobin: 9.9 g/dL — ABNORMAL LOW (ref 13.0–17.0)
Immature Granulocytes: 0 %
Lymphocytes Relative: 18 %
Lymphs Abs: 1.4 10*3/uL (ref 0.7–4.0)
MCH: 32.5 pg (ref 26.0–34.0)
MCHC: 32.8 g/dL (ref 30.0–36.0)
MCV: 99 fL (ref 80.0–100.0)
Monocytes Absolute: 0.9 10*3/uL (ref 0.1–1.0)
Monocytes Relative: 11 %
Neutro Abs: 4.8 10*3/uL (ref 1.7–7.7)
Neutrophils Relative %: 60 %
Platelet Count: 776 10*3/uL — ABNORMAL HIGH (ref 150–400)
RBC: 3.05 MIL/uL — ABNORMAL LOW (ref 4.22–5.81)
RDW: 28.2 % — ABNORMAL HIGH (ref 11.5–15.5)
WBC Count: 8 10*3/uL (ref 4.0–10.5)
nRBC: 0 % (ref 0.0–0.2)

## 2018-09-17 LAB — CMP (CANCER CENTER ONLY)
ALT: 35 U/L (ref 0–44)
AST: 17 U/L (ref 15–41)
Albumin: 3.9 g/dL (ref 3.5–5.0)
Alkaline Phosphatase: 57 U/L (ref 38–126)
Anion gap: 7 (ref 5–15)
BUN: 16 mg/dL (ref 8–23)
CO2: 27 mmol/L (ref 22–32)
Calcium: 8.7 mg/dL — ABNORMAL LOW (ref 8.9–10.3)
Chloride: 108 mmol/L (ref 98–111)
Creatinine: 0.88 mg/dL (ref 0.61–1.24)
GFR, Est AFR Am: >60 mL/min (ref >60)
GFR, Estimated: >60 mL/min (ref >60)
Glucose, Bld: 132 mg/dL — ABNORMAL HIGH (ref 70–99)
Potassium: 4.3 mmol/L (ref 3.5–5.1)
Sodium: 142 mmol/L (ref 135–145)
Total Bilirubin: 0.9 mg/dL (ref 0.3–1.2)
Total Protein: 6.7 g/dL (ref 6.5–8.1)

## 2018-09-17 MED ORDER — EPOETIN ALFA 40000 UNIT/ML IJ SOLN
INTRAMUSCULAR | Status: AC
  Filled 2018-09-17: qty 1

## 2018-09-17 MED ORDER — EPOETIN ALFA 40000 UNIT/ML IJ SOLN
40000.0000 [IU] | Freq: Once | INTRAMUSCULAR | Status: AC
  Administered 2018-09-17: 40000 [IU] via SUBCUTANEOUS

## 2018-09-17 NOTE — Progress Notes (Signed)
[facility]  [phone] Fax:([phone]) [phone]  OFFICE PROGRESS NOTE  [name], MD [address] [place] [address]  DIAGNOSIS: 1.  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 Essential thrombocythemia with positive JAK2 mutation 3. Leukocytosis.   PRIOR THERAPY: Previous treatment with combination of Hydrea and anagrelide.  CURRENT THERAPY:  1) Revlimid 5 mg by mouth daily.  Started 03/25/2016.  Status post 24 cycles. 2) anagrelide 1 mg by mouth every other day. 3) Aranesp 300 mcg subcutaneously every 3 weeks.  This is switched to Procrit 400 mcg subcutaneously every 2 weeks starting April 13, 2017.  The frequency of the Procrit was changed to weekly schedule when he was in [place].  INTERVAL HISTORY: Roy Johnson 73 y.o. male returns to the clinic today for follow-up visit.  The patient was last seen number of 2019.  He spent the last 6 months in [place].  He has been doing fine except for increasing fatigue.  He denied having any recent chest pain, shortness of breath, cough or hemoptysis.  He is complaining of abdominal pain started when he was in [place].  He mentioned that his local oncologist in [place] was planning CT scan of the abdomen and pelvis but the patient had to come back to [place].  He has alternating episodes of diarrhea and constipation.  He denied having any current fever or chills.  He has no concerning weight loss or night sweats.  He is here today to resume his treatment with Procrit.  He is currently on anagrelide 1 mg p.o. every other day.  The patient had repeat blood work performed earlier today and he is here for evaluation and recommendation regarding his condition.    MEDICAL HISTORY: Past Medical History:  Diagnosis Date  . Anemia   . Colon polyp   . Diverticulitis   . Diverticulosis   . GERD (gastroesophageal reflux disease)   .  Hyperlipidemia   . IBS (irritable bowel syndrome)   . Inguinal hernia   . 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  . Prostate cancer ([place])                . Prostate cancer ([place])   . Umbilical hernia   . Vitamin D deficiency     ALLERGIES:  has No Known Allergies.  MEDICATIONS:  Current Outpatient Medications  Medication Sig Dispense Refill  . amLODipine (NORVASC) 5 MG tablet Take 5 mg by mouth daily.  2  . amphetamine-dextroamphetamine (ADDERALL) 10 MG tablet Take 1 tablet (10 mg total) by mouth daily with breakfast. 30 tablet 0  . anagrelide (AGRYLIN) 1 MG capsule TAKE 1 CAPSULE(1 MG) BY MOUTH DAILY 90 capsule 0  . aspirin EC 81 MG tablet Take 81 mg by mouth.    Marland Kitchen epoetin alfa-epbx (RETACRIT) 29937 UNIT/ML injection Inject into the skin.    Marland Kitchen gabapentin (NEURONTIN) 100 MG capsule One capsule twice a day, 2 at night 120 capsule 3  . lenalidomide (REVLIMID) 5 MG capsule Take 5 mg by mouth daily.    . Multiple Vitamin (MULTI-VITAMINS) TABS Take 1 tablet by mouth daily.    . Polyethylene Glycol 3350 (PEG 3350) POWD nightly.    . vitamin B-12 (CYANOCOBALAMIN) 1000 MCG tablet Take 1 tablet by mouth daily as needed.     No current facility-administered medications for this visit.    Facility-Administered Medications Ordered in Other Visits  Medication Dose Route Frequency Provider Last Rate Last Dose  .  0.9 %  sodium chloride infusion  250 mL Intravenous Once Curt Bears, MD        SURGICAL HISTORY:  Past Surgical History:  Procedure Laterality Date  . COLONOSCOPY    . POLYPECTOMY    . PROSTATECTOMY    . TONSILLECTOMY      REVIEW OF SYSTEMS:  Constitutional: positive for fatigue Eyes: negative Ears, nose, mouth, throat, and face: negative Respiratory: negative Cardiovascular: negative Gastrointestinal: positive for abdominal pain, constipation, diarrhea and nausea Genitourinary:negative Integument/breast: negative Hematologic/lymphatic: negative Musculoskeletal:negative  Neurological: negative Behavioral/Psych: negative Endocrine: negative Allergic/Immunologic: negative   PHYSICAL EXAMINATION: General appearance: alert, cooperative, appears stated age, fatigued and no distress Head: Normocephalic, without obvious abnormality, atraumatic Neck: no adenopathy, no JVD, supple, symmetrical, trachea midline and thyroid not enlarged, symmetric, no tenderness/mass/nodules Lymph nodes: Cervical, supraclavicular, and axillary nodes normal. Resp: clear to auscultation bilaterally Back: symmetric, no curvature. ROM normal. No CVA tenderness. Cardio: regular rate and rhythm, S1, S2 normal, no murmur, click, rub or gallop GI: abnormal findings:  distended Extremities: extremities normal, atraumatic, no cyanosis or edema Neurologic: Alert and oriented X 3, normal strength and tone. Normal symmetric reflexes. Normal coordination and gait  ECOG PERFORMANCE STATUS: 1 - Symptomatic but completely ambulatory  Blood pressure 131/76, pulse 73, temperature 98.5 F (36.9 C), temperature source Oral, resp. rate 18, height 5\' 8"  (1.727 m), weight 164 lb 4.8 oz (74.5 kg), SpO2 100 %.  LABORATORY DATA:  Lab Results  Component Value Date   WBC 8.0 09/17/2018   HGB 9.9 (L) 09/17/2018   HCT 30.2 (L) 09/17/2018   MCV 99.0 09/17/2018   PLT 776 (H) 09/17/2018      Chemistry      Component Value Date/Time   NA 142 09/17/2018 1101   NA 141 12/08/2016 0928   K 4.3 09/17/2018 1101   K 4.5 12/08/2016 0928   CL 108 09/17/2018 1101   CO2 27 09/17/2018 1101   CO2 27 12/08/2016 0928   BUN 16 09/17/2018 1101   BUN 14.1 12/08/2016 0928   CREATININE 0.88 09/17/2018 1101   CREATININE 0.9 12/08/2016 0928      Component Value Date/Time   CALCIUM 8.7 (L) 09/17/2018 1101   CALCIUM 9.0 12/08/2016 0928   ALKPHOS 57 09/17/2018 1101   ALKPHOS 87 12/08/2016 0928   AST 17 09/17/2018 1101   AST 25 12/08/2016 0928   ALT 35 09/17/2018 1101   ALT 41 12/08/2016 0928   BILITOT 0.9  09/17/2018 1101   BILITOT 0.84 12/08/2016 0928      RADIOGRAPHIC STUDIES:  ASSESSMENT AND PLAN:  This is a very pleasant 73 years old white male with essential thrombocythemia with positive JAK-2 mutation as well as myelodysplastic syndrome. He is currently on treatment with Revlimid 5 mg by mouth daily according to the recommendation from [facility] [place].  He is also on treatment with Aranesp every 3 weeks.  Aranesp was a change it to Procrit initially every 2 weeks and currently on weekly basis.  He mentioned that he is feeling much better on Procrit weekly  He has been on the weekly treatment with Procrit for the last 12 months.  He is feeling very well and his hemoglobin has been in the range of 10-11 G/DL. He spent the last 6 months in [place]. His hemoglobin is down to 9.9 today.  I recommended for the patient to resume his treatment with Procrit. For the essential thrombocythemia, he was advised to increase his dose of anagrelide to 1  mg p.o. daily. For the abdominal pain and history of myeloproliferative disorder, I will order CT of the abdomen and pelvis to rule out any abnormality, hepatosplenomegaly. The patient will come back for follow-up visit in 3 months for evaluation and repeat blood work. He was advised to call immediately if he has any other concerning symptoms in the interval. All questions were answered. The patient knows to call the clinic with any problems, questions or concerns. We can certainly see the patient much sooner if necessary.   Disclaimer: This note was dictated with voice recognition software. Similar sounding words can inadvertently be transcribed and may not be corrected upon review.

## 2018-09-19 ENCOUNTER — Telehealth: Payer: Self-pay | Admitting: Internal Medicine

## 2018-09-19 NOTE — Telephone Encounter (Signed)
Scheduled appt per 6/08 los - unable to reach pt . Left message with appt date and time   

## 2018-09-25 ENCOUNTER — Ambulatory Visit (HOSPITAL_COMMUNITY)
Admission: RE | Admit: 2018-09-25 | Discharge: 2018-09-25 | Disposition: A | Discharge status: Home / Self Care | Payer: Medicare Other | Source: Ambulatory Visit | Attending: Internal Medicine | Admitting: Internal Medicine

## 2018-09-25 ENCOUNTER — Other Ambulatory Visit: Payer: Self-pay

## 2018-09-25 ENCOUNTER — Inpatient Hospital Stay: Payer: Medicare Other

## 2018-09-25 VITALS — BP 131/78 | HR 71 | Temp 97.8°F | Resp 18

## 2018-09-25 DIAGNOSIS — D469 Myelodysplastic syndrome, unspecified: Secondary | ICD-10-CM

## 2018-09-25 DIAGNOSIS — R1084 Generalized abdominal pain: Secondary | ICD-10-CM | POA: Diagnosis present

## 2018-09-25 LAB — CBC WITH DIFFERENTIAL (CANCER CENTER ONLY)
Abs Immature Granulocytes: 0.09 10*3/uL — ABNORMAL HIGH (ref 0.00–0.07)
Basophils Absolute: 0.2 10*3/uL — ABNORMAL HIGH (ref 0.0–0.1)
Basophils Relative: 3 %
Eosinophils Absolute: 0.3 10*3/uL (ref 0.0–0.5)
Eosinophils Relative: 4 %
HCT: 32.9 % — ABNORMAL LOW (ref 39.0–52.0)
Hemoglobin: 10.6 g/dL — ABNORMAL LOW (ref 13.0–17.0)
Immature Granulocytes: 1 %
Lymphocytes Relative: 25 %
Lymphs Abs: 2.1 10*3/uL (ref 0.7–4.0)
MCH: 32.4 pg (ref 26.0–34.0)
MCHC: 32.2 g/dL (ref 30.0–36.0)
MCV: 100.6 fL — ABNORMAL HIGH (ref 80.0–100.0)
Monocytes Absolute: 1 10*3/uL (ref 0.1–1.0)
Monocytes Relative: 12 %
Neutro Abs: 4.7 10*3/uL (ref 1.7–7.7)
Neutrophils Relative %: 55 %
Platelet Count: 598 10*3/uL — ABNORMAL HIGH (ref 150–400)
RBC: 3.27 MIL/uL — ABNORMAL LOW (ref 4.22–5.81)
RDW: 28.4 % — ABNORMAL HIGH (ref 11.5–15.5)
WBC Count: 8.5 10*3/uL (ref 4.0–10.5)
nRBC: 0.6 % — ABNORMAL HIGH (ref 0.0–0.2)

## 2018-09-25 LAB — CMP (CANCER CENTER ONLY)
ALT: 40 U/L (ref 0–44)
AST: 19 U/L (ref 15–41)
Albumin: 4.3 g/dL (ref 3.5–5.0)
Alkaline Phosphatase: 59 U/L (ref 38–126)
Anion gap: 9 (ref 5–15)
BUN: 13 mg/dL (ref 8–23)
CO2: 24 mmol/L (ref 22–32)
Calcium: 9.1 mg/dL (ref 8.9–10.3)
Chloride: 108 mmol/L (ref 98–111)
Creatinine: 0.92 mg/dL (ref 0.61–1.24)
GFR, Est AFR Am: >60 mL/min (ref >60)
GFR, Estimated: >60 mL/min (ref >60)
Glucose, Bld: 86 mg/dL (ref 70–99)
Potassium: 4.5 mmol/L (ref 3.5–5.1)
Sodium: 141 mmol/L (ref 135–145)
Total Bilirubin: 0.9 mg/dL (ref 0.3–1.2)
Total Protein: 7.4 g/dL (ref 6.5–8.1)

## 2018-09-25 MED ORDER — SODIUM CHLORIDE (PF) 0.9 % IJ SOLN
INTRAMUSCULAR | Status: AC
  Filled 2018-09-25: qty 50

## 2018-09-25 MED ORDER — EPOETIN ALFA-EPBX 40000 UNIT/ML IJ SOLN
40000.0000 [IU] | Freq: Once | INTRAMUSCULAR | Status: AC
  Administered 2018-09-25: 40000 [IU] via SUBCUTANEOUS
  Filled 2018-09-25: qty 1

## 2018-09-25 MED ORDER — IOHEXOL 300 MG/ML  SOLN
100.0000 mL | Freq: Once | INTRAMUSCULAR | Status: AC | PRN
  Administered 2018-09-25: 100 mL via INTRAVENOUS

## 2018-09-25 NOTE — Patient Instructions (Signed)
Epoetin Alfa injection °What is this medicine? °EPOETIN ALFA (e POE e tin AL fa) helps your body make more red blood cells. This medicine is used to treat anemia caused by chronic kidney disease, cancer chemotherapy, or HIV-therapy. It may also be used before surgery if you have anemia. °This medicine may be used for other purposes; ask your health care provider or pharmacist if you have questions. °COMMON BRAND NAME(S): Epogen, Procrit, Retacrit °What should I tell my health care provider before I take this medicine? °They need to know if you have any of these conditions: °-cancer °-heart disease °-high blood pressure °-history of blood clots °-history of stroke °-low levels of folate, iron, or vitamin B12 in the blood °-seizures °-an unusual or allergic reaction to erythropoietin, albumin, benzyl alcohol, hamster proteins, other medicines, foods, dyes, or preservatives °-pregnant or trying to get pregnant °-breast-feeding °How should I use this medicine? °This medicine is for injection into a vein or under the skin. It is usually given by a health care professional in a hospital or clinic setting. °If you get this medicine at home, you will be taught how to prepare and give this medicine. Use exactly as directed. Take your medicine at regular intervals. Do not take your medicine more often than directed. °It is important that you put your used needles and syringes in a special sharps container. Do not put them in a trash can. If you do not have a sharps container, call your pharmacist or healthcare provider to get one. °A special MedGuide will be given to you by the pharmacist with each prescription and refill. Be sure to read this information carefully each time. °Talk to your pediatrician regarding the use of this medicine in children. While this drug may be prescribed for selected conditions, precautions do apply. °Overdosage: If you think you have taken too much of this medicine contact a poison control center  or emergency room at once. °NOTE: This medicine is only for you. Do not share this medicine with others. °What if I miss a dose? °If you miss a dose, take it as soon as you can. If it is almost time for your next dose, take only that dose. Do not take double or extra doses. °What may interact with this medicine? °Interactions have not been studied. °This list may not describe all possible interactions. Give your health care provider a list of all the medicines, herbs, non-prescription drugs, or dietary supplements you use. Also tell them if you smoke, drink alcohol, or use illegal drugs. Some items may interact with your medicine. °What should I watch for while using this medicine? °Your condition will be monitored carefully while you are receiving this medicine. °You may need blood work done while you are taking this medicine. °This medicine may cause a decrease in vitamin B6. You should make sure that you get enough vitamin B6 while you are taking this medicine. Discuss the foods you eat and the vitamins you take with your health care professional. °What side effects may I notice from receiving this medicine? °Side effects that you should report to your doctor or health care professional as soon as possible: °-allergic reactions like skin rash, itching or hives, swelling of the face, lips, or tongue °-seizures °-signs and symptoms of a blood clot such as breathing problems; changes in vision; chest pain; severe, sudden headache; pain, swelling, warmth in the leg; trouble speaking; sudden numbness or weakness of the face, arm or leg °-signs and symptoms of a stroke   like changes in vision; confusion; trouble speaking or understanding; severe headaches; sudden numbness or weakness of the face, arm or leg; trouble walking; dizziness; loss of balance or coordination °Side effects that usually do not require medical attention (report to your doctor or health care professional if they continue or are  bothersome): °-chills °-cough °-dizziness °-fever °-headaches °-joint pain °-muscle cramps °-muscle pain °-nausea, vomiting °-pain, redness, or irritation at site where injected °This list may not describe all possible side effects. Call your doctor for medical advice about side effects. You may report side effects to FDA at 1-800-FDA-1088. °Where should I keep my medicine? °Keep out of the reach of children. °Store in a refrigerator between 2 and 8 degrees C (36 and 46 degrees F). Do not freeze or shake. Throw away any unused portion if using a single-dose vial. Multi-dose vials can be kept in the refrigerator for up to 21 days after the initial dose. Throw away unused medicine. °NOTE: This sheet is a summary. It may not cover all possible information. If you have questions about this medicine, talk to your doctor, pharmacist, or health care provider. °© 2019 [employer]vier/[employer] Standard (2016-11-04 08:35:19) ° °

## 2018-10-01 ENCOUNTER — Other Ambulatory Visit: Payer: Self-pay

## 2018-10-01 ENCOUNTER — Inpatient Hospital Stay: Payer: Medicare Other

## 2018-10-01 VITALS — BP 138/76 | HR 78 | Temp 99.2°F | Resp 18

## 2018-10-01 DIAGNOSIS — D469 Myelodysplastic syndrome, unspecified: Secondary | ICD-10-CM

## 2018-10-01 LAB — CBC WITH DIFFERENTIAL (CANCER CENTER ONLY)
Abs Immature Granulocytes: 0.05 10*3/uL (ref 0.00–0.07)
Basophils Absolute: 0.1 10*3/uL (ref 0.0–0.1)
Basophils Relative: 2 %
Eosinophils Absolute: 0.5 10*3/uL (ref 0.0–0.5)
Eosinophils Relative: 8 %
HCT: 32.2 % — ABNORMAL LOW (ref 39.0–52.0)
Hemoglobin: 10.4 g/dL — ABNORMAL LOW (ref 13.0–17.0)
Immature Granulocytes: 1 %
Lymphocytes Relative: 27 %
Lymphs Abs: 1.5 10*3/uL (ref 0.7–4.0)
MCH: 31.7 pg (ref 26.0–34.0)
MCHC: 32.3 g/dL (ref 30.0–36.0)
MCV: 98.2 fL (ref 80.0–100.0)
Monocytes Absolute: 0.4 10*3/uL (ref 0.1–1.0)
Monocytes Relative: 7 %
Neutro Abs: 3.1 10*3/uL (ref 1.7–7.7)
Neutrophils Relative %: 55 %
Platelet Count: 552 10*3/uL — ABNORMAL HIGH (ref 150–400)
RBC: 3.28 MIL/uL — ABNORMAL LOW (ref 4.22–5.81)
RDW: 28.8 % — ABNORMAL HIGH (ref 11.5–15.5)
WBC Count: 5.6 10*3/uL (ref 4.0–10.5)
nRBC: 0.5 % — ABNORMAL HIGH (ref 0.0–0.2)

## 2018-10-01 LAB — CMP (CANCER CENTER ONLY)
ALT: 34 U/L (ref 0–44)
AST: 24 U/L (ref 15–41)
Albumin: 4 g/dL (ref 3.5–5.0)
Alkaline Phosphatase: 60 U/L (ref 38–126)
Anion gap: 8 (ref 5–15)
BUN: 21 mg/dL (ref 8–23)
CO2: 24 mmol/L (ref 22–32)
Calcium: 8.9 mg/dL (ref 8.9–10.3)
Chloride: 107 mmol/L (ref 98–111)
Creatinine: 1.2 mg/dL (ref 0.61–1.24)
GFR, Est AFR Am: >60 mL/min (ref >60)
GFR, Estimated: >60 mL/min (ref >60)
Glucose, Bld: 100 mg/dL — ABNORMAL HIGH (ref 70–99)
Potassium: 4.3 mmol/L (ref 3.5–5.1)
Sodium: 139 mmol/L (ref 135–145)
Total Bilirubin: 0.9 mg/dL (ref 0.3–1.2)
Total Protein: 7 g/dL (ref 6.5–8.1)

## 2018-10-01 MED ORDER — EPOETIN ALFA-EPBX 40000 UNIT/ML IJ SOLN
40000.0000 [IU] | Freq: Once | INTRAMUSCULAR | Status: AC
  Administered 2018-10-01: 40000 [IU] via SUBCUTANEOUS
  Filled 2018-10-01: qty 1

## 2018-10-01 NOTE — Patient Instructions (Signed)
Epoetin Alfa injection °What is this medicine? °EPOETIN ALFA (e POE e tin AL fa) helps your body make more red blood cells. This medicine is used to treat anemia caused by chronic kidney disease, cancer chemotherapy, or HIV-therapy. It may also be used before surgery if you have anemia. °This medicine may be used for other purposes; ask your health care provider or pharmacist if you have questions. °COMMON BRAND NAME(S): Epogen, Procrit, Retacrit °What should I tell my health care provider before I take this medicine? °They need to know if you have any of these conditions: °-cancer °-heart disease °-high blood pressure °-history of blood clots °-history of stroke °-low levels of folate, iron, or vitamin B12 in the blood °-seizures °-an unusual or allergic reaction to erythropoietin, albumin, benzyl alcohol, hamster proteins, other medicines, foods, dyes, or preservatives °-pregnant or trying to get pregnant °-breast-feeding °How should I use this medicine? °This medicine is for injection into a vein or under the skin. It is usually given by a health care professional in a hospital or clinic setting. °If you get this medicine at home, you will be taught how to prepare and give this medicine. Use exactly as directed. Take your medicine at regular intervals. Do not take your medicine more often than directed. °It is important that you put your used needles and syringes in a special sharps container. Do not put them in a trash can. If you do not have a sharps container, call your pharmacist or healthcare provider to get one. °A special MedGuide will be given to you by the pharmacist with each prescription and refill. Be sure to read this information carefully each time. °Talk to your pediatrician regarding the use of this medicine in children. While this drug may be prescribed for selected conditions, precautions do apply. °Overdosage: If you think you have taken too much of this medicine contact a poison control center  or emergency room at once. °NOTE: This medicine is only for you. Do not share this medicine with others. °What if I miss a dose? °If you miss a dose, take it as soon as you can. If it is almost time for your next dose, take only that dose. Do not take double or extra doses. °What may interact with this medicine? °Interactions have not been studied. °This list may not describe all possible interactions. Give your health care provider a list of all the medicines, herbs, non-prescription drugs, or dietary supplements you use. Also tell them if you smoke, drink alcohol, or use illegal drugs. Some items may interact with your medicine. °What should I watch for while using this medicine? °Your condition will be monitored carefully while you are receiving this medicine. °You may need blood work done while you are taking this medicine. °This medicine may cause a decrease in vitamin B6. You should make sure that you get enough vitamin B6 while you are taking this medicine. Discuss the foods you eat and the vitamins you take with your health care professional. °What side effects may I notice from receiving this medicine? °Side effects that you should report to your doctor or health care professional as soon as possible: °-allergic reactions like skin rash, itching or hives, swelling of the face, lips, or tongue °-seizures °-signs and symptoms of a blood clot such as breathing problems; changes in vision; chest pain; severe, sudden headache; pain, swelling, warmth in the leg; trouble speaking; sudden numbness or weakness of the face, arm or leg °-signs and symptoms of a stroke   like changes in vision; confusion; trouble speaking or understanding; severe headaches; sudden numbness or weakness of the face, arm or leg; trouble walking; dizziness; loss of balance or coordination °Side effects that usually do not require medical attention (report to your doctor or health care professional if they continue or are  bothersome): °-chills °-cough °-dizziness °-fever °-headaches °-joint pain °-muscle cramps °-muscle pain °-nausea, vomiting °-pain, redness, or irritation at site where injected °This list may not describe all possible side effects. Call your doctor for medical advice about side effects. You may report side effects to FDA at 1-800-FDA-1088. °Where should I keep my medicine? °Keep out of the reach of children. °Store in a refrigerator between 2 and 8 degrees C (36 and 46 degrees F). Do not freeze or shake. Throw away any unused portion if using a single-dose vial. Multi-dose vials can be kept in the refrigerator for up to 21 days after the initial dose. Throw away unused medicine. °NOTE: This sheet is a summary. It may not cover all possible information. If you have questions about this medicine, talk to your doctor, pharmacist, or health care provider. °© 2019 [employer]vier/[employer] Standard (2016-11-04 08:35:19) ° °

## 2018-10-02 ENCOUNTER — Ambulatory Visit: Payer: Medicare Other

## 2018-10-02 ENCOUNTER — Other Ambulatory Visit: Payer: Medicare Other

## 2018-10-03 ENCOUNTER — Telehealth: Payer: Self-pay

## 2018-10-03 NOTE — Telephone Encounter (Signed)
[facility] pharmacy on [facility] called and requested refill of hyoscyamine 0.125 SL.  Pt has not been seen since 2017. Called and left message for pt to call and schedule a with Dr. Henrene Pastor.

## 2018-10-04 NOTE — Telephone Encounter (Signed)
Patient called back would like a refill from now until his appt on 7-14 since he is completely out.

## 2018-10-05 MED ORDER — HYOSCYAMINE SULFATE 0.125 MG SL SUBL: 0.1250 mg | SUBLINGUAL_TABLET | SUBLINGUAL | 0 refills | Status: DC | PRN

## 2018-10-05 NOTE — Telephone Encounter (Signed)
Okay for refill until follow-up.  Thanks

## 2018-10-05 NOTE — Addendum Note (Signed)
Addended by: Marzella Schlein on: 10/05/2018 02:53 PM   Modules accepted: Orders

## 2018-10-05 NOTE — Telephone Encounter (Signed)
Please advise Dr. Henrene Pastor if you wish to refill hyoscyamine medication until scheduled appt. Pt has not been seen since 2017 but scheduled appt with you on 10/23/18.

## 2018-10-05 NOTE — Telephone Encounter (Signed)
Prescription sent to patient's pharmacy.

## 2018-10-08 ENCOUNTER — Other Ambulatory Visit: Payer: Self-pay

## 2018-10-08 ENCOUNTER — Ambulatory Visit: Payer: Medicare Other

## 2018-10-08 ENCOUNTER — Inpatient Hospital Stay: Payer: Medicare Other

## 2018-10-08 ENCOUNTER — Other Ambulatory Visit: Payer: Medicare Other

## 2018-10-08 VITALS — BP 132/72 | HR 72 | Temp 98.2°F | Resp 18

## 2018-10-08 DIAGNOSIS — D469 Myelodysplastic syndrome, unspecified: Secondary | ICD-10-CM

## 2018-10-08 LAB — CBC WITH DIFFERENTIAL (CANCER CENTER ONLY)
Abs Immature Granulocytes: 0.07 10*3/uL (ref 0.00–0.07)
Basophils Absolute: 0.2 10*3/uL — ABNORMAL HIGH (ref 0.0–0.1)
Basophils Relative: 2 %
Eosinophils Absolute: 1 10*3/uL — ABNORMAL HIGH (ref 0.0–0.5)
Eosinophils Relative: 10 %
HCT: 32.8 % — ABNORMAL LOW (ref 39.0–52.0)
Hemoglobin: 10.8 g/dL — ABNORMAL LOW (ref 13.0–17.0)
Immature Granulocytes: 1 %
Lymphocytes Relative: 20 %
Lymphs Abs: 2 10*3/uL (ref 0.7–4.0)
MCH: 32.2 pg (ref 26.0–34.0)
MCHC: 32.9 g/dL (ref 30.0–36.0)
MCV: 97.9 fL (ref 80.0–100.0)
Monocytes Absolute: 1.2 10*3/uL — ABNORMAL HIGH (ref 0.1–1.0)
Monocytes Relative: 12 %
Neutro Abs: 5.4 10*3/uL (ref 1.7–7.7)
Neutrophils Relative %: 55 %
Platelet Count: 850 10*3/uL — ABNORMAL HIGH (ref 150–400)
RBC: 3.35 MIL/uL — ABNORMAL LOW (ref 4.22–5.81)
RDW: 29.4 % — ABNORMAL HIGH (ref 11.5–15.5)
WBC Count: 9.8 10*3/uL (ref 4.0–10.5)
nRBC: 0.3 % — ABNORMAL HIGH (ref 0.0–0.2)

## 2018-10-08 LAB — CMP (CANCER CENTER ONLY)
ALT: 42 U/L (ref 0–44)
AST: 18 U/L (ref 15–41)
Albumin: 4.1 g/dL (ref 3.5–5.0)
Alkaline Phosphatase: 68 U/L (ref 38–126)
Anion gap: 7 (ref 5–15)
BUN: 16 mg/dL (ref 8–23)
CO2: 25 mmol/L (ref 22–32)
Calcium: 8.9 mg/dL (ref 8.9–10.3)
Chloride: 109 mmol/L (ref 98–111)
Creatinine: 0.95 mg/dL (ref 0.61–1.24)
GFR, Est AFR Am: >60 mL/min (ref >60)
GFR, Estimated: >60 mL/min (ref >60)
Glucose, Bld: 100 mg/dL — ABNORMAL HIGH (ref 70–99)
Potassium: 4.5 mmol/L (ref 3.5–5.1)
Sodium: 141 mmol/L (ref 135–145)
Total Bilirubin: 0.8 mg/dL (ref 0.3–1.2)
Total Protein: 7 g/dL (ref 6.5–8.1)

## 2018-10-08 MED ORDER — EPOETIN ALFA-EPBX 40000 UNIT/ML IJ SOLN
40000.0000 [IU] | Freq: Once | INTRAMUSCULAR | Status: AC
  Administered 2018-10-08: 11:00:00 40000 [IU] via SUBCUTANEOUS
  Filled 2018-10-08: qty 1

## 2018-10-09 ENCOUNTER — Ambulatory Visit: Payer: Medicare Other

## 2018-10-09 ENCOUNTER — Other Ambulatory Visit: Payer: Medicare Other

## 2018-10-15 ENCOUNTER — Other Ambulatory Visit: Payer: Self-pay

## 2018-10-15 ENCOUNTER — Inpatient Hospital Stay: Payer: Medicare Other

## 2018-10-15 ENCOUNTER — Ambulatory Visit: Payer: Medicare Other

## 2018-10-15 ENCOUNTER — Inpatient Hospital Stay: Payer: Medicare Other | Attending: Internal Medicine

## 2018-10-15 ENCOUNTER — Other Ambulatory Visit: Payer: Medicare Other

## 2018-10-15 VITALS — BP 128/72 | HR 72 | Temp 98.1°F | Resp 19

## 2018-10-15 DIAGNOSIS — D469 Myelodysplastic syndrome, unspecified: Secondary | ICD-10-CM

## 2018-10-15 LAB — CBC WITH DIFFERENTIAL (CANCER CENTER ONLY)
Abs Immature Granulocytes: 0.02 10*3/uL (ref 0.00–0.07)
Basophils Absolute: 0.2 10*3/uL — ABNORMAL HIGH (ref 0.0–0.1)
Basophils Relative: 2 %
Eosinophils Absolute: 1.1 10*3/uL — ABNORMAL HIGH (ref 0.0–0.5)
Eosinophils Relative: 14 %
HCT: 32.2 % — ABNORMAL LOW (ref 39.0–52.0)
Hemoglobin: 10.7 g/dL — ABNORMAL LOW (ref 13.0–17.0)
Immature Granulocytes: 0 %
Lymphocytes Relative: 23 %
Lymphs Abs: 1.7 10*3/uL (ref 0.7–4.0)
MCH: 31.8 pg (ref 26.0–34.0)
MCHC: 33.2 g/dL (ref 30.0–36.0)
MCV: 95.8 fL (ref 80.0–100.0)
Monocytes Absolute: 0.9 10*3/uL (ref 0.1–1.0)
Monocytes Relative: 11 %
Neutro Abs: 3.8 10*3/uL (ref 1.7–7.7)
Neutrophils Relative %: 50 %
Platelet Count: 470 10*3/uL — ABNORMAL HIGH (ref 150–400)
RBC: 3.36 MIL/uL — ABNORMAL LOW (ref 4.22–5.81)
RDW: 29.2 % — ABNORMAL HIGH (ref 11.5–15.5)
WBC Count: 7.7 10*3/uL (ref 4.0–10.5)
WBC Morphology: INCREASED
nRBC: 0 % (ref 0.0–0.2)

## 2018-10-15 MED ORDER — EPOETIN ALFA-EPBX 40000 UNIT/ML IJ SOLN
40000.0000 [IU] | Freq: Once | INTRAMUSCULAR | Status: AC
  Administered 2018-10-15: 40000 [IU] via SUBCUTANEOUS
  Filled 2018-10-15: qty 1

## 2018-10-15 NOTE — Patient Instructions (Signed)
Epoetin Alfa injection °What is this medicine? °EPOETIN ALFA (e POE e tin AL fa) helps your body make more red blood cells. This medicine is used to treat anemia caused by chronic kidney disease, cancer chemotherapy, or HIV-therapy. It may also be used before surgery if you have anemia. °This medicine may be used for other purposes; ask your health care provider or pharmacist if you have questions. °COMMON BRAND NAME(S): Epogen, Procrit, Retacrit °What should I tell my health care provider before I take this medicine? °They need to know if you have any of these conditions: °-cancer °-heart disease °-high blood pressure °-history of blood clots °-history of stroke °-low levels of folate, iron, or vitamin B12 in the blood °-seizures °-an unusual or allergic reaction to erythropoietin, albumin, benzyl alcohol, hamster proteins, other medicines, foods, dyes, or preservatives °-pregnant or trying to get pregnant °-breast-feeding °How should I use this medicine? °This medicine is for injection into a vein or under the skin. It is usually given by a health care professional in a hospital or clinic setting. °If you get this medicine at home, you will be taught how to prepare and give this medicine. Use exactly as directed. Take your medicine at regular intervals. Do not take your medicine more often than directed. °It is important that you put your used needles and syringes in a special sharps container. Do not put them in a trash can. If you do not have a sharps container, call your pharmacist or healthcare provider to get one. °A special MedGuide will be given to you by the pharmacist with each prescription and refill. Be sure to read this information carefully each time. °Talk to your pediatrician regarding the use of this medicine in children. While this drug may be prescribed for selected conditions, precautions do apply. °Overdosage: If you think you have taken too much of this medicine contact a poison control center  or emergency room at once. °NOTE: This medicine is only for you. Do not share this medicine with others. °What if I miss a dose? °If you miss a dose, take it as soon as you can. If it is almost time for your next dose, take only that dose. Do not take double or extra doses. °What may interact with this medicine? °Interactions have not been studied. °This list may not describe all possible interactions. Give your health care provider a list of all the medicines, herbs, non-prescription drugs, or dietary supplements you use. Also tell them if you smoke, drink alcohol, or use illegal drugs. Some items may interact with your medicine. °What should I watch for while using this medicine? °Your condition will be monitored carefully while you are receiving this medicine. °You may need blood work done while you are taking this medicine. °This medicine may cause a decrease in vitamin B6. You should make sure that you get enough vitamin B6 while you are taking this medicine. Discuss the foods you eat and the vitamins you take with your health care professional. °What side effects may I notice from receiving this medicine? °Side effects that you should report to your doctor or health care professional as soon as possible: °-allergic reactions like skin rash, itching or hives, swelling of the face, lips, or tongue °-seizures °-signs and symptoms of a blood clot such as breathing problems; changes in vision; chest pain; severe, sudden headache; pain, swelling, warmth in the leg; trouble speaking; sudden numbness or weakness of the face, arm or leg °-signs and symptoms of a stroke   like changes in vision; confusion; trouble speaking or understanding; severe headaches; sudden numbness or weakness of the face, arm or leg; trouble walking; dizziness; loss of balance or coordination °Side effects that usually do not require medical attention (report to your doctor or health care professional if they continue or are  bothersome): °-chills °-cough °-dizziness °-fever °-headaches °-joint pain °-muscle cramps °-muscle pain °-nausea, vomiting °-pain, redness, or irritation at site where injected °This list may not describe all possible side effects. Call your doctor for medical advice about side effects. You may report side effects to FDA at 1-800-FDA-1088. °Where should I keep my medicine? °Keep out of the reach of children. °Store in a refrigerator between 2 and 8 degrees C (36 and 46 degrees F). Do not freeze or shake. Throw away any unused portion if using a single-dose vial. Multi-dose vials can be kept in the refrigerator for up to 21 days after the initial dose. Throw away unused medicine. °NOTE: This sheet is a summary. It may not cover all possible information. If you have questions about this medicine, talk to your doctor, pharmacist, or health care provider. °© 2019 [employer]vier/[employer] Standard (2016-11-04 08:35:19) ° °

## 2018-10-16 ENCOUNTER — Other Ambulatory Visit: Payer: Medicare Other

## 2018-10-16 ENCOUNTER — Ambulatory Visit: Payer: Medicare Other

## 2018-10-22 ENCOUNTER — Ambulatory Visit: Payer: Medicare Other

## 2018-10-22 ENCOUNTER — Inpatient Hospital Stay: Payer: Medicare Other

## 2018-10-22 ENCOUNTER — Other Ambulatory Visit: Payer: Self-pay

## 2018-10-22 ENCOUNTER — Encounter: Payer: Self-pay | Admitting: General Surgery

## 2018-10-22 ENCOUNTER — Telehealth: Payer: Self-pay | Admitting: General Surgery

## 2018-10-22 ENCOUNTER — Other Ambulatory Visit: Payer: Medicare Other

## 2018-10-22 VITALS — BP 134/82 | HR 75 | Temp 98.2°F | Resp 18

## 2018-10-22 DIAGNOSIS — D469 Myelodysplastic syndrome, unspecified: Secondary | ICD-10-CM

## 2018-10-22 LAB — CBC WITH DIFFERENTIAL (CANCER CENTER ONLY)
Abs Immature Granulocytes: 0.01 10*3/uL (ref 0.00–0.07)
Basophils Absolute: 0.2 10*3/uL — ABNORMAL HIGH (ref 0.0–0.1)
Basophils Relative: 3 %
Eosinophils Absolute: 0.3 10*3/uL (ref 0.0–0.5)
Eosinophils Relative: 4 %
HCT: 32.2 % — ABNORMAL LOW (ref 39.0–52.0)
Hemoglobin: 10.9 g/dL — ABNORMAL LOW (ref 13.0–17.0)
Immature Granulocytes: 0 %
Lymphocytes Relative: 24 %
Lymphs Abs: 1.6 10*3/uL (ref 0.7–4.0)
MCH: 32.1 pg (ref 26.0–34.0)
MCHC: 33.9 g/dL (ref 30.0–36.0)
MCV: 94.7 fL (ref 80.0–100.0)
Monocytes Absolute: 0.7 10*3/uL (ref 0.1–1.0)
Monocytes Relative: 11 %
Neutro Abs: 3.8 10*3/uL (ref 1.7–7.7)
Neutrophils Relative %: 58 %
Platelet Count: 595 10*3/uL — ABNORMAL HIGH (ref 150–400)
RBC: 3.4 MIL/uL — ABNORMAL LOW (ref 4.22–5.81)
RDW: 29.2 % — ABNORMAL HIGH (ref 11.5–15.5)
WBC Count: 6.5 10*3/uL (ref 4.0–10.5)
nRBC: 0.3 % — ABNORMAL HIGH (ref 0.0–0.2)

## 2018-10-22 MED ORDER — EPOETIN ALFA-EPBX 40000 UNIT/ML IJ SOLN
40000.0000 [IU] | Freq: Once | INTRAMUSCULAR | Status: AC
  Administered 2018-10-22: 40000 [IU] via SUBCUTANEOUS
  Filled 2018-10-22: qty 1

## 2018-10-22 NOTE — Telephone Encounter (Signed)
Contacted the patient to pre-screen for virtual visit. LVM on home and mobile numbers

## 2018-10-22 NOTE — Patient Instructions (Signed)

## 2018-10-22 NOTE — Progress Notes (Addendum)
CC: needs a refill for Levsin, told to make an appt for refill. Patient stated he goes between diarrhea and constipation

## 2018-10-23 ENCOUNTER — Other Ambulatory Visit: Payer: Medicare Other

## 2018-10-23 ENCOUNTER — Encounter: Payer: Self-pay | Admitting: Internal Medicine

## 2018-10-23 ENCOUNTER — Ambulatory Visit (INDEPENDENT_AMBULATORY_CARE_PROVIDER_SITE_OTHER): Payer: Medicare Other | Admitting: Internal Medicine

## 2018-10-23 ENCOUNTER — Ambulatory Visit: Payer: Medicare Other

## 2018-10-23 VITALS — Ht 68.0 in | Wt 163.0 lb

## 2018-10-23 DIAGNOSIS — K5730 Diverticulosis of large intestine without perforation or abscess without bleeding: Secondary | ICD-10-CM | POA: Diagnosis not present

## 2018-10-23 DIAGNOSIS — K582 Mixed irritable bowel syndrome: Secondary | ICD-10-CM | POA: Diagnosis not present

## 2018-10-23 DIAGNOSIS — R109 Unspecified abdominal pain: Secondary | ICD-10-CM

## 2018-10-23 NOTE — Progress Notes (Signed)
HISTORY OF PRESENT ILLNESS:  Roy Johnson is a 73 y.o. male with a history of prostate cancer status post prostatectomy, diverticulosis complicated by diverticulitis, colon polyps, blood dyscrasia, and irritable bowel syndrome.  He was last evaluated in this office October 19, 2015 regarding chronic stable episodes of abdominal cramping associated with loose stools and vagal type symptoms.  See that dictation.  His last complete colonoscopy was performed July 20, 2011.  Aside from severe diverticulosis the examination was normal.  The patient schedules this telehealth medicine visit during the coronavirus pandemic regarding problems with abdominal cramping and a request for refill in his Levsin.  Was having problems with generalized abdominal pain and vomiting September 25, 2018 for which he underwent contrast-enhanced CT scan of the abdomen and pelvis.  There were no acute abnormalities.  He was noted to have diverticulosis but no evidence of diverticulitis.  There was a suggestion of cholelithiasis without other abnormalities.  Symptoms resolved.  These did occur at a time when he was having cramping abdominal pain similar to his irritable bowel type episodes.  He does describe his bowel habits as tending to alternate between constipation and diarrhea.  He believes some of his medications may be contributing to his irregularities.  Fiber helps.  Review of blood work from October 15, 2018 shows white blood cell count of 7.7.  Comprehensive metabolic panel from October 08, 2018 was unremarkable.  Normal liver test.  He continues to be treated for his myelodysplastic syndrome  REVIEW OF SYSTEMS:  All non-GI ROS negative unless otherwise stated in the HPI.  Past Medical History:  Diagnosis Date  . Anemia   . Colon polyp   . Diverticulitis   . Diverticulosis   . GERD (gastroesophageal reflux disease)   . Hyperlipidemia   . IBS (irritable bowel syndrome)   . Inguinal hernia   . 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  . Prostate  cancer ([place])                . Prostate cancer ([place])   . Umbilical hernia   . Vitamin D deficiency     Past Surgical History:  Procedure Laterality Date  . COLONOSCOPY    . POLYPECTOMY    . PROSTATECTOMY    . TONSILLECTOMY      Social History Roy Johnson  reports that he quit smoking about 45 years ago. He has never used smokeless tobacco. He reports current alcohol use of about 7.0 standard drinks of alcohol per week. He reports that he does not use drugs.  family history includes Breast cancer in his mother; Colon polyps in his father; Heart disease in his father; Lung cancer in his father; Osteoarthritis in his mother; Prostate cancer in his father.  No Known Allergies     PHYSICAL EXAMINATION: No physical examination with telehealth visit   ASSESSMENT:  1.  Chronic alternating bowel habits consistent with IBS 2.  Intermittent abdominal cramping with vagal symptoms responding to antispasmodics 3.  Diverticulosis with a history of diverticulitis 4.  Myelodysplastic syndrome   PLAN:  1.  REFILL Levsin.  1 year refills 2.  Continue fiber 3.  Follow-up colonoscopy around 2023 4.  Interval follow-up as needed This telehealth medicine visit was initiated by consented for by the patient who was in his home while I was in my office.  He understands her may be associated professional charge for this service which totaled 15 minutes

## 2018-10-24 MED ORDER — HYOSCYAMINE SULFATE 0.125 MG SL SUBL: 0.1250 mg | SUBLINGUAL_TABLET | SUBLINGUAL | 3 refills | Status: DC | PRN

## 2018-10-24 NOTE — Addendum Note (Signed)
Addended by: Lanny Hurst A on: 10/24/2018 11:16 AM   Modules accepted: Orders

## 2018-10-24 NOTE — Patient Instructions (Addendum)
If you are age 73 or older, your body mass index should be between 23-30. Your Body mass index is 24.78 kg/m. If this is out of the aforementioned range listed, please consider follow up with your Primary Care Provider.  If you are age 64 or younger, your body mass index should be between 19-25. Your Body mass index is 24.78 kg/m. If this is out of the aformentioned range listed, please consider follow up with your Primary Care Provider.   We have sent the following medications to your pharmacy for you to pick up at your convenience:  Levsin   Continue with fiber and  follow-up as needed.  We will send you a reminder for your colonoscopy in 2023.

## 2018-10-29 ENCOUNTER — Other Ambulatory Visit: Payer: Medicare Other

## 2018-10-29 ENCOUNTER — Ambulatory Visit: Payer: Medicare Other

## 2018-10-29 ENCOUNTER — Inpatient Hospital Stay: Payer: Medicare Other

## 2018-10-29 ENCOUNTER — Other Ambulatory Visit: Payer: Self-pay

## 2018-10-29 DIAGNOSIS — D469 Myelodysplastic syndrome, unspecified: Secondary | ICD-10-CM | POA: Diagnosis not present

## 2018-10-29 LAB — CBC WITH DIFFERENTIAL (CANCER CENTER ONLY)
Abs Immature Granulocytes: 0.2 10*3/uL — ABNORMAL HIGH (ref 0.00–0.07)
Basophils Absolute: 0.2 10*3/uL — ABNORMAL HIGH (ref 0.0–0.1)
Basophils Relative: 3 %
Eosinophils Absolute: 0.8 10*3/uL — ABNORMAL HIGH (ref 0.0–0.5)
Eosinophils Relative: 10 %
HCT: 34.2 % — ABNORMAL LOW (ref 39.0–52.0)
Hemoglobin: 11 g/dL — ABNORMAL LOW (ref 13.0–17.0)
Immature Granulocytes: 3 %
Lymphocytes Relative: 24 %
Lymphs Abs: 1.9 10*3/uL (ref 0.7–4.0)
MCH: 31.3 pg (ref 26.0–34.0)
MCHC: 32.2 g/dL (ref 30.0–36.0)
MCV: 97.2 fL (ref 80.0–100.0)
Monocytes Absolute: 0.7 10*3/uL (ref 0.1–1.0)
Monocytes Relative: 8 %
Neutro Abs: 4.1 10*3/uL (ref 1.7–7.7)
Neutrophils Relative %: 52 %
Platelet Count: 640 10*3/uL — ABNORMAL HIGH (ref 150–400)
RBC: 3.52 MIL/uL — ABNORMAL LOW (ref 4.22–5.81)
RDW: 31 % — ABNORMAL HIGH (ref 11.5–15.5)
WBC Count: 7.9 10*3/uL (ref 4.0–10.5)
nRBC: 0.6 % — ABNORMAL HIGH (ref 0.0–0.2)

## 2018-10-29 NOTE — Progress Notes (Signed)
Pt HGB is 11.0 today no injection needed

## 2018-10-30 ENCOUNTER — Other Ambulatory Visit: Payer: Medicare Other

## 2018-10-30 ENCOUNTER — Ambulatory Visit: Payer: Medicare Other

## 2018-10-30 ENCOUNTER — Other Ambulatory Visit: Payer: Self-pay | Admitting: *Deleted

## 2018-10-30 DIAGNOSIS — D469 Myelodysplastic syndrome, unspecified: Secondary | ICD-10-CM

## 2018-10-30 MED ORDER — ANAGRELIDE HCL 1 MG PO CAPS: ORAL_CAPSULE | ORAL | 0 refills | Status: DC

## 2018-11-05 ENCOUNTER — Ambulatory Visit: Payer: Medicare Other

## 2018-11-05 ENCOUNTER — Other Ambulatory Visit: Payer: Self-pay

## 2018-11-05 ENCOUNTER — Inpatient Hospital Stay: Payer: Medicare Other

## 2018-11-05 ENCOUNTER — Other Ambulatory Visit: Payer: Medicare Other

## 2018-11-05 VITALS — BP 134/63 | HR 73 | Temp 98.2°F | Resp 18

## 2018-11-05 DIAGNOSIS — D469 Myelodysplastic syndrome, unspecified: Secondary | ICD-10-CM | POA: Diagnosis not present

## 2018-11-05 LAB — CBC WITH DIFFERENTIAL (CANCER CENTER ONLY)
Abs Immature Granulocytes: 0.02 10*3/uL (ref 0.00–0.07)
Basophils Absolute: 0.2 10*3/uL — ABNORMAL HIGH (ref 0.0–0.1)
Basophils Relative: 3 %
Eosinophils Absolute: 1.1 10*3/uL — ABNORMAL HIGH (ref 0.0–0.5)
Eosinophils Relative: 13 %
HCT: 32.1 % — ABNORMAL LOW (ref 39.0–52.0)
Hemoglobin: 10.2 g/dL — ABNORMAL LOW (ref 13.0–17.0)
Immature Granulocytes: 0 %
Lymphocytes Relative: 22 %
Lymphs Abs: 1.8 10*3/uL (ref 0.7–4.0)
MCH: 31 pg (ref 26.0–34.0)
MCHC: 31.8 g/dL (ref 30.0–36.0)
MCV: 97.6 fL (ref 80.0–100.0)
Monocytes Absolute: 1 10*3/uL (ref 0.1–1.0)
Monocytes Relative: 12 %
Neutro Abs: 4 10*3/uL (ref 1.7–7.7)
Neutrophils Relative %: 50 %
Platelet Count: 445 10*3/uL — ABNORMAL HIGH (ref 150–400)
RBC: 3.29 MIL/uL — ABNORMAL LOW (ref 4.22–5.81)
RDW: 29.7 % — ABNORMAL HIGH (ref 11.5–15.5)
WBC Count: 7.9 10*3/uL (ref 4.0–10.5)
WBC Morphology: INCREASED
nRBC: 0 % (ref 0.0–0.2)

## 2018-11-05 MED ORDER — EPOETIN ALFA-EPBX 40000 UNIT/ML IJ SOLN
40000.0000 [IU] | Freq: Once | INTRAMUSCULAR | Status: AC
  Administered 2018-11-05: 40000 [IU] via SUBCUTANEOUS
  Filled 2018-11-05: qty 1

## 2018-11-05 NOTE — Patient Instructions (Signed)

## 2018-11-06 ENCOUNTER — Other Ambulatory Visit: Payer: Self-pay | Admitting: Medical Oncology

## 2018-11-06 ENCOUNTER — Ambulatory Visit: Payer: Medicare Other

## 2018-11-06 ENCOUNTER — Other Ambulatory Visit: Payer: Medicare Other

## 2018-11-06 DIAGNOSIS — D469 Myelodysplastic syndrome, unspecified: Secondary | ICD-10-CM

## 2018-11-06 MED ORDER — ANAGRELIDE HCL 1 MG PO CAPS: ORAL_CAPSULE | ORAL | 5 refills | Status: DC

## 2018-11-12 ENCOUNTER — Other Ambulatory Visit: Payer: Self-pay

## 2018-11-12 ENCOUNTER — Inpatient Hospital Stay: Payer: Medicare Other

## 2018-11-12 ENCOUNTER — Ambulatory Visit: Payer: Medicare Other

## 2018-11-12 ENCOUNTER — Other Ambulatory Visit: Payer: Medicare Other

## 2018-11-12 ENCOUNTER — Inpatient Hospital Stay: Payer: Medicare Other | Attending: Internal Medicine

## 2018-11-12 VITALS — BP 128/62 | HR 72 | Temp 98.2°F | Resp 18

## 2018-11-12 DIAGNOSIS — D72829 Elevated white blood cell count, unspecified: Secondary | ICD-10-CM | POA: Insufficient documentation

## 2018-11-12 DIAGNOSIS — R5383 Other fatigue: Secondary | ICD-10-CM | POA: Diagnosis not present

## 2018-11-12 DIAGNOSIS — D461 Refractory anemia with ring sideroblasts: Secondary | ICD-10-CM | POA: Insufficient documentation

## 2018-11-12 DIAGNOSIS — Z8719 Personal history of other diseases of the digestive system: Secondary | ICD-10-CM | POA: Diagnosis not present

## 2018-11-12 DIAGNOSIS — D469 Myelodysplastic syndrome, unspecified: Secondary | ICD-10-CM

## 2018-11-12 DIAGNOSIS — Z79899 Other long term (current) drug therapy: Secondary | ICD-10-CM | POA: Insufficient documentation

## 2018-11-12 DIAGNOSIS — D473 Essential (hemorrhagic) thrombocythemia: Secondary | ICD-10-CM | POA: Insufficient documentation

## 2018-11-12 DIAGNOSIS — Z8546 Personal history of malignant neoplasm of prostate: Secondary | ICD-10-CM | POA: Insufficient documentation

## 2018-11-12 LAB — CBC WITH DIFFERENTIAL (CANCER CENTER ONLY)
Abs Immature Granulocytes: 0.03 10*3/uL (ref 0.00–0.07)
Basophils Absolute: 0.2 10*3/uL — ABNORMAL HIGH (ref 0.0–0.1)
Basophils Relative: 2 %
Eosinophils Absolute: 1.3 10*3/uL — ABNORMAL HIGH (ref 0.0–0.5)
Eosinophils Relative: 16 %
HCT: 30.3 % — ABNORMAL LOW (ref 39.0–52.0)
Hemoglobin: 10 g/dL — ABNORMAL LOW (ref 13.0–17.0)
Immature Granulocytes: 0 %
Lymphocytes Relative: 25 %
Lymphs Abs: 2 10*3/uL (ref 0.7–4.0)
MCH: 31.7 pg (ref 26.0–34.0)
MCHC: 33 g/dL (ref 30.0–36.0)
MCV: 96.2 fL (ref 80.0–100.0)
Monocytes Absolute: 0.8 10*3/uL (ref 0.1–1.0)
Monocytes Relative: 10 %
Neutro Abs: 3.6 10*3/uL (ref 1.7–7.7)
Neutrophils Relative %: 47 %
Platelet Count: 546 10*3/uL — ABNORMAL HIGH (ref 150–400)
RBC: 3.15 MIL/uL — ABNORMAL LOW (ref 4.22–5.81)
RDW: 28.9 % — ABNORMAL HIGH (ref 11.5–15.5)
WBC Count: 7.9 10*3/uL (ref 4.0–10.5)
nRBC: 0 % (ref 0.0–0.2)

## 2018-11-12 MED ORDER — EPOETIN ALFA-EPBX 40000 UNIT/ML IJ SOLN
40000.0000 [IU] | Freq: Once | INTRAMUSCULAR | Status: AC
  Administered 2018-11-12: 40000 [IU] via SUBCUTANEOUS
  Filled 2018-11-12: qty 1

## 2018-11-12 NOTE — Patient Instructions (Signed)

## 2018-11-13 ENCOUNTER — Other Ambulatory Visit: Payer: Medicare Other

## 2018-11-13 ENCOUNTER — Ambulatory Visit: Payer: Medicare Other

## 2018-11-19 ENCOUNTER — Inpatient Hospital Stay: Payer: Medicare Other

## 2018-11-19 ENCOUNTER — Other Ambulatory Visit: Payer: Medicare Other

## 2018-11-19 ENCOUNTER — Other Ambulatory Visit: Payer: Self-pay

## 2018-11-19 ENCOUNTER — Ambulatory Visit: Payer: Medicare Other

## 2018-11-19 VITALS — BP 146/79 | HR 73 | Temp 99.1°F | Resp 18

## 2018-11-19 DIAGNOSIS — D469 Myelodysplastic syndrome, unspecified: Secondary | ICD-10-CM

## 2018-11-19 DIAGNOSIS — D461 Refractory anemia with ring sideroblasts: Secondary | ICD-10-CM | POA: Diagnosis not present

## 2018-11-19 LAB — CBC WITH DIFFERENTIAL (CANCER CENTER ONLY)
Abs Immature Granulocytes: 0.05 10*3/uL (ref 0.00–0.07)
Basophils Absolute: 0.3 10*3/uL — ABNORMAL HIGH (ref 0.0–0.1)
Basophils Relative: 3 %
Eosinophils Absolute: 0.4 10*3/uL (ref 0.0–0.5)
Eosinophils Relative: 5 %
HCT: 32.2 % — ABNORMAL LOW (ref 39.0–52.0)
Hemoglobin: 10.6 g/dL — ABNORMAL LOW (ref 13.0–17.0)
Immature Granulocytes: 1 %
Lymphocytes Relative: 25 %
Lymphs Abs: 2.1 10*3/uL (ref 0.7–4.0)
MCH: 31.6 pg (ref 26.0–34.0)
MCHC: 32.9 g/dL (ref 30.0–36.0)
MCV: 96.1 fL (ref 80.0–100.0)
Monocytes Absolute: 0.9 10*3/uL (ref 0.1–1.0)
Monocytes Relative: 11 %
Neutro Abs: 4.6 10*3/uL (ref 1.7–7.7)
Neutrophils Relative %: 55 %
Platelet Count: 662 10*3/uL — ABNORMAL HIGH (ref 150–400)
RBC: 3.35 MIL/uL — ABNORMAL LOW (ref 4.22–5.81)
RDW: 29 % — ABNORMAL HIGH (ref 11.5–15.5)
WBC Count: 8.3 10*3/uL (ref 4.0–10.5)
nRBC: 0.5 % — ABNORMAL HIGH (ref 0.0–0.2)

## 2018-11-19 MED ORDER — EPOETIN ALFA-EPBX 40000 UNIT/ML IJ SOLN
40000.0000 [IU] | Freq: Once | INTRAMUSCULAR | Status: AC
  Administered 2018-11-19: 40000 [IU] via SUBCUTANEOUS
  Filled 2018-11-19: qty 1

## 2018-11-19 NOTE — Patient Instructions (Signed)

## 2018-11-20 ENCOUNTER — Ambulatory Visit: Payer: Medicare Other

## 2018-11-20 ENCOUNTER — Other Ambulatory Visit: Payer: Medicare Other

## 2018-11-26 ENCOUNTER — Inpatient Hospital Stay: Payer: Medicare Other

## 2018-11-26 ENCOUNTER — Ambulatory Visit: Payer: Medicare Other

## 2018-11-26 ENCOUNTER — Other Ambulatory Visit: Payer: Self-pay

## 2018-11-26 ENCOUNTER — Other Ambulatory Visit: Payer: Medicare Other

## 2018-11-26 DIAGNOSIS — D469 Myelodysplastic syndrome, unspecified: Secondary | ICD-10-CM

## 2018-11-26 DIAGNOSIS — D461 Refractory anemia with ring sideroblasts: Secondary | ICD-10-CM | POA: Diagnosis not present

## 2018-11-26 LAB — CBC WITH DIFFERENTIAL (CANCER CENTER ONLY)
Abs Immature Granulocytes: 0.09 10*3/uL — ABNORMAL HIGH (ref 0.00–0.07)
Basophils Absolute: 0.2 10*3/uL — ABNORMAL HIGH (ref 0.0–0.1)
Basophils Relative: 2 %
Eosinophils Absolute: 0.8 10*3/uL — ABNORMAL HIGH (ref 0.0–0.5)
Eosinophils Relative: 11 %
HCT: 34.9 % — ABNORMAL LOW (ref 39.0–52.0)
Hemoglobin: 11.1 g/dL — ABNORMAL LOW (ref 13.0–17.0)
Immature Granulocytes: 1 %
Lymphocytes Relative: 18 %
Lymphs Abs: 1.3 10*3/uL (ref 0.7–4.0)
MCH: 31.1 pg (ref 26.0–34.0)
MCHC: 31.8 g/dL (ref 30.0–36.0)
MCV: 97.8 fL (ref 80.0–100.0)
Monocytes Absolute: 0.5 10*3/uL (ref 0.1–1.0)
Monocytes Relative: 7 %
Neutro Abs: 4.5 10*3/uL (ref 1.7–7.7)
Neutrophils Relative %: 61 %
Platelet Count: 553 10*3/uL — ABNORMAL HIGH (ref 150–400)
RBC: 3.57 MIL/uL — ABNORMAL LOW (ref 4.22–5.81)
RDW: 30.4 % — ABNORMAL HIGH (ref 11.5–15.5)
WBC Count: 7.4 10*3/uL (ref 4.0–10.5)
nRBC: 0.3 % — ABNORMAL HIGH (ref 0.0–0.2)

## 2018-11-26 NOTE — Progress Notes (Signed)
Pt hgb is 11.1 today no injection needed at this time

## 2018-11-27 ENCOUNTER — Other Ambulatory Visit: Payer: Medicare Other

## 2018-11-27 ENCOUNTER — Ambulatory Visit: Payer: Medicare Other

## 2018-11-29 ENCOUNTER — Other Ambulatory Visit: Payer: Self-pay | Admitting: Internal Medicine

## 2018-12-03 ENCOUNTER — Other Ambulatory Visit: Payer: Self-pay | Admitting: Neurology

## 2018-12-03 ENCOUNTER — Ambulatory Visit: Payer: Medicare Other

## 2018-12-03 ENCOUNTER — Other Ambulatory Visit: Payer: Medicare Other

## 2018-12-03 ENCOUNTER — Inpatient Hospital Stay: Payer: Medicare Other

## 2018-12-03 ENCOUNTER — Other Ambulatory Visit: Payer: Self-pay | Admitting: Internal Medicine

## 2018-12-03 ENCOUNTER — Other Ambulatory Visit: Payer: Self-pay

## 2018-12-03 VITALS — BP 141/77 | HR 69 | Temp 98.9°F | Resp 18

## 2018-12-03 DIAGNOSIS — D469 Myelodysplastic syndrome, unspecified: Secondary | ICD-10-CM

## 2018-12-03 DIAGNOSIS — D461 Refractory anemia with ring sideroblasts: Secondary | ICD-10-CM | POA: Diagnosis not present

## 2018-12-03 LAB — CBC WITH DIFFERENTIAL (CANCER CENTER ONLY)
Abs Immature Granulocytes: 0.03 10*3/uL (ref 0.00–0.07)
Basophils Absolute: 0.2 10*3/uL — ABNORMAL HIGH (ref 0.0–0.1)
Basophils Relative: 2 %
Eosinophils Absolute: 1.3 10*3/uL — ABNORMAL HIGH (ref 0.0–0.5)
Eosinophils Relative: 15 %
HCT: 33.1 % — ABNORMAL LOW (ref 39.0–52.0)
Hemoglobin: 10.8 g/dL — ABNORMAL LOW (ref 13.0–17.0)
Immature Granulocytes: 0 %
Lymphocytes Relative: 21 %
Lymphs Abs: 1.8 10*3/uL (ref 0.7–4.0)
MCH: 31.2 pg (ref 26.0–34.0)
MCHC: 32.6 g/dL (ref 30.0–36.0)
MCV: 95.7 fL (ref 80.0–100.0)
Monocytes Absolute: 1.1 10*3/uL — ABNORMAL HIGH (ref 0.1–1.0)
Monocytes Relative: 13 %
Neutro Abs: 4.2 10*3/uL (ref 1.7–7.7)
Neutrophils Relative %: 49 %
Platelet Count: 513 10*3/uL — ABNORMAL HIGH (ref 150–400)
RBC: 3.46 MIL/uL — ABNORMAL LOW (ref 4.22–5.81)
RDW: 30.2 % — ABNORMAL HIGH (ref 11.5–15.5)
WBC Count: 8.5 10*3/uL (ref 4.0–10.5)
nRBC: 0 % (ref 0.0–0.2)

## 2018-12-03 MED ORDER — EPOETIN ALFA-EPBX 40000 UNIT/ML IJ SOLN
40000.0000 [IU] | Freq: Once | INTRAMUSCULAR | Status: AC
  Administered 2018-12-03: 11:00:00 40000 [IU] via SUBCUTANEOUS
  Filled 2018-12-03: qty 1

## 2018-12-03 NOTE — Patient Instructions (Signed)

## 2018-12-04 ENCOUNTER — Other Ambulatory Visit: Payer: Medicare Other

## 2018-12-04 ENCOUNTER — Ambulatory Visit: Payer: Medicare Other

## 2018-12-10 ENCOUNTER — Inpatient Hospital Stay: Payer: Medicare Other

## 2018-12-10 ENCOUNTER — Ambulatory Visit: Payer: Medicare Other

## 2018-12-10 ENCOUNTER — Other Ambulatory Visit: Payer: Self-pay

## 2018-12-10 ENCOUNTER — Other Ambulatory Visit: Payer: Medicare Other

## 2018-12-10 VITALS — BP 139/77 | HR 66 | Temp 98.0°F | Resp 18

## 2018-12-10 DIAGNOSIS — D461 Refractory anemia with ring sideroblasts: Secondary | ICD-10-CM | POA: Diagnosis not present

## 2018-12-10 DIAGNOSIS — D469 Myelodysplastic syndrome, unspecified: Secondary | ICD-10-CM

## 2018-12-10 LAB — CBC WITH DIFFERENTIAL (CANCER CENTER ONLY)
Abs Immature Granulocytes: 0.03 10*3/uL (ref 0.00–0.07)
Basophils Absolute: 0.2 10*3/uL — ABNORMAL HIGH (ref 0.0–0.1)
Basophils Relative: 3 %
Eosinophils Absolute: 1.3 10*3/uL — ABNORMAL HIGH (ref 0.0–0.5)
Eosinophils Relative: 15 %
HCT: 31.1 % — ABNORMAL LOW (ref 39.0–52.0)
Hemoglobin: 10.2 g/dL — ABNORMAL LOW (ref 13.0–17.0)
Immature Granulocytes: 0 %
Lymphocytes Relative: 19 %
Lymphs Abs: 1.6 10*3/uL (ref 0.7–4.0)
MCH: 31 pg (ref 26.0–34.0)
MCHC: 32.8 g/dL (ref 30.0–36.0)
MCV: 94.5 fL (ref 80.0–100.0)
Monocytes Absolute: 0.9 10*3/uL (ref 0.1–1.0)
Monocytes Relative: 11 %
Neutro Abs: 4.4 10*3/uL (ref 1.7–7.7)
Neutrophils Relative %: 52 %
Platelet Count: 795 10*3/uL — ABNORMAL HIGH (ref 150–400)
RBC: 3.29 MIL/uL — ABNORMAL LOW (ref 4.22–5.81)
RDW: 29.2 % — ABNORMAL HIGH (ref 11.5–15.5)
WBC Count: 8.6 10*3/uL (ref 4.0–10.5)
nRBC: 0.2 % (ref 0.0–0.2)

## 2018-12-10 MED ORDER — EPOETIN ALFA-EPBX 40000 UNIT/ML IJ SOLN
40000.0000 [IU] | Freq: Once | INTRAMUSCULAR | Status: AC
  Administered 2018-12-10: 40000 [IU] via SUBCUTANEOUS
  Filled 2018-12-10: qty 1

## 2018-12-10 NOTE — Patient Instructions (Signed)

## 2018-12-11 ENCOUNTER — Ambulatory Visit: Payer: Medicare Other

## 2018-12-11 ENCOUNTER — Other Ambulatory Visit: Payer: Medicare Other

## 2018-12-18 ENCOUNTER — Inpatient Hospital Stay: Payer: Medicare Other

## 2018-12-18 ENCOUNTER — Other Ambulatory Visit: Payer: Self-pay

## 2018-12-18 ENCOUNTER — Ambulatory Visit: Payer: Medicare Other

## 2018-12-18 ENCOUNTER — Inpatient Hospital Stay (HOSPITAL_BASED_OUTPATIENT_CLINIC_OR_DEPARTMENT_OTHER): Payer: Medicare Other | Admitting: Internal Medicine

## 2018-12-18 ENCOUNTER — Inpatient Hospital Stay: Payer: Medicare Other | Attending: Internal Medicine

## 2018-12-18 ENCOUNTER — Other Ambulatory Visit: Payer: Medicare Other

## 2018-12-18 ENCOUNTER — Encounter: Payer: Self-pay | Admitting: Internal Medicine

## 2018-12-18 ENCOUNTER — Ambulatory Visit: Payer: Medicare Other | Admitting: Internal Medicine

## 2018-12-18 VITALS — BP 139/73 | HR 74 | Temp 99.1°F | Resp 18 | Ht 68.0 in | Wt 163.2 lb

## 2018-12-18 DIAGNOSIS — M2550 Pain in unspecified joint: Secondary | ICD-10-CM | POA: Insufficient documentation

## 2018-12-18 DIAGNOSIS — R531 Weakness: Secondary | ICD-10-CM | POA: Diagnosis not present

## 2018-12-18 DIAGNOSIS — Z8546 Personal history of malignant neoplasm of prostate: Secondary | ICD-10-CM | POA: Insufficient documentation

## 2018-12-18 DIAGNOSIS — D72829 Elevated white blood cell count, unspecified: Secondary | ICD-10-CM | POA: Insufficient documentation

## 2018-12-18 DIAGNOSIS — R5383 Other fatigue: Secondary | ICD-10-CM | POA: Diagnosis not present

## 2018-12-18 DIAGNOSIS — D461 Refractory anemia with ring sideroblasts: Secondary | ICD-10-CM | POA: Insufficient documentation

## 2018-12-18 DIAGNOSIS — D469 Myelodysplastic syndrome, unspecified: Secondary | ICD-10-CM

## 2018-12-18 DIAGNOSIS — Z79899 Other long term (current) drug therapy: Secondary | ICD-10-CM | POA: Diagnosis not present

## 2018-12-18 DIAGNOSIS — D473 Essential (hemorrhagic) thrombocythemia: Secondary | ICD-10-CM

## 2018-12-18 DIAGNOSIS — Z8719 Personal history of other diseases of the digestive system: Secondary | ICD-10-CM | POA: Insufficient documentation

## 2018-12-18 DIAGNOSIS — Z9079 Acquired absence of other genital organ(s): Secondary | ICD-10-CM | POA: Insufficient documentation

## 2018-12-18 DIAGNOSIS — R140 Abdominal distension (gaseous): Secondary | ICD-10-CM | POA: Diagnosis not present

## 2018-12-18 LAB — CBC WITH DIFFERENTIAL (CANCER CENTER ONLY)
Abs Immature Granulocytes: 0.03 10*3/uL (ref 0.00–0.07)
Basophils Absolute: 0.2 10*3/uL — ABNORMAL HIGH (ref 0.0–0.1)
Basophils Relative: 3 %
Eosinophils Absolute: 0.5 10*3/uL (ref 0.0–0.5)
Eosinophils Relative: 6 %
HCT: 33.1 % — ABNORMAL LOW (ref 39.0–52.0)
Hemoglobin: 10.7 g/dL — ABNORMAL LOW (ref 13.0–17.0)
Immature Granulocytes: 0 %
Lymphocytes Relative: 23 %
Lymphs Abs: 1.9 10*3/uL (ref 0.7–4.0)
MCH: 31.8 pg (ref 26.0–34.0)
MCHC: 32.3 g/dL (ref 30.0–36.0)
MCV: 98.5 fL (ref 80.0–100.0)
Monocytes Absolute: 0.9 10*3/uL (ref 0.1–1.0)
Monocytes Relative: 11 %
Neutro Abs: 4.8 10*3/uL (ref 1.7–7.7)
Neutrophils Relative %: 57 %
Platelet Count: 668 10*3/uL — ABNORMAL HIGH (ref 150–400)
RBC: 3.36 MIL/uL — ABNORMAL LOW (ref 4.22–5.81)
RDW: 29.4 % — ABNORMAL HIGH (ref 11.5–15.5)
WBC Count: 8.3 10*3/uL (ref 4.0–10.5)
nRBC: 0.4 % — ABNORMAL HIGH (ref 0.0–0.2)

## 2018-12-18 MED ORDER — EPOETIN ALFA 40000 UNIT/ML IJ SOLN
INTRAMUSCULAR | Status: AC
  Filled 2018-12-18: qty 1

## 2018-12-18 MED ORDER — EPOETIN ALFA-EPBX 40000 UNIT/ML IJ SOLN
40000.0000 [IU] | Freq: Once | INTRAMUSCULAR | Status: DC
  Administered 2018-12-18: 40000 [IU] via SUBCUTANEOUS
  Filled 2018-12-18: qty 1

## 2018-12-18 MED ORDER — ANAGRELIDE HCL 0.5 MG PO CAPS: 0.5000 mg | ORAL_CAPSULE | Freq: Two times a day (BID) | ORAL | 5 refills | Status: DC

## 2018-12-18 NOTE — Patient Instructions (Signed)

## 2018-12-18 NOTE — Progress Notes (Signed)
[facility]  Telephone:([phone] Fax:(336) [phone]  OFFICE PROGRESS NOTE  [name], MD [address] [place] [address]  DIAGNOSIS: 1.  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 Essential thrombocythemia with positive JAK2 mutation 3. Leukocytosis.   PRIOR THERAPY: Previous treatment with combination of Hydrea and anagrelide.  CURRENT THERAPY:  1) Revlimid 5 mg by mouth daily.  Started 03/25/2016.  Status post 24 cycles. 2) anagrelide 1 mg by mouth every other day. 3) Aranesp 300 mcg subcutaneously every 3 weeks.  This is switched to Procrit 400 mcg subcutaneously every 2 weeks starting April 13, 2017.  The frequency of the Procrit was changed to weekly schedule when he was in [place].  He is currently on Retacrit weekly as a biosimilar to Procrit.  INTERVAL HISTORY: ORLA ALLRED 73 y.o. male returns to the clinic today for follow-up visit.  The patient is feeling fine today with no concerning complaints except for the persistent fatigue as well as arthralgia and weakness in his hands.  He denied having any current chest pain, shortness of breath, cough or hemoptysis.  He has no nausea, vomiting, diarrhea or constipation but continues to have bloating of his abdomen.  He had CT scan of the abdomen pelvis performed 3 months ago that was unremarkable for any concerning findings.  The patient has no recent weight loss or night sweats.  He has no fever or chills.  He was seen earlier today by his primary care physician.  He continues to tolerate his treatment with Retacrit.  He is here today for evaluation and repeat blood work.  MEDICAL HISTORY: Past Medical History:  Diagnosis Date  . Anemia   . Colon polyp   . Diverticulitis   . Diverticulosis   . GERD (gastroesophageal reflux disease)   . Hyperlipidemia   . IBS (irritable bowel syndrome)   . Inguinal hernia   . Memory  disorder 12/26/2017  . Prostate cancer ([place])                . Prostate cancer ([place])   . Umbilical hernia   . Vitamin D deficiency     ALLERGIES:  has No Known Allergies.  MEDICATIONS:  Current Outpatient Medications  Medication Sig Dispense Refill  . amLODipine (NORVASC) 5 MG tablet Take 5 mg by mouth daily.  2  . anagrelide (AGRYLIN) 1 MG capsule TAKE 1 CAPSULE(1 MG) BY MOUTH DAILY 90 capsule 5  . aspirin EC 81 MG tablet Take 81 mg by mouth.    Marland Kitchen epoetin alfa-epbx (RETACRIT) 52778 UNIT/ML injection Inject into the skin.    Marland Kitchen gabapentin (NEURONTIN) 100 MG capsule TAKE 1 CAPSULE BY MOUTH TWICE DAILY AND 2 AT NIGHT 120 capsule 3  . hyoscyamine (LEVSIN SL) 0.125 MG SL tablet Place 1 tablet (0.125 mg total) under the tongue every 4 (four) hours as needed. 90 tablet 3  . lenalidomide (REVLIMID) 5 MG capsule Take 5 mg by mouth daily.    . Multiple Vitamin (MULTI-VITAMINS) TABS Take 1 tablet by mouth daily.    . Polyethylene Glycol 3350 (PEG 3350) POWD nightly.    . vitamin B-12 (CYANOCOBALAMIN) 1000 MCG tablet Take 1 tablet by mouth daily as needed.     No current facility-administered medications for this visit.    Facility-Administered Medications Ordered in Other Visits  Medication Dose Route Frequency Provider Last Rate Last Dose  . 0.9 %  sodium chloride infusion  250 mL Intravenous  Once Curt Bears, MD        SURGICAL HISTORY:  Past Surgical History:  Procedure Laterality Date  . COLONOSCOPY    . POLYPECTOMY    . PROSTATECTOMY    . TONSILLECTOMY      REVIEW OF SYSTEMS:  A comprehensive review of systems was negative except for: Constitutional: positive for fatigue Musculoskeletal: positive for arthralgias   PHYSICAL EXAMINATION: General appearance: alert, cooperative, appears stated age, fatigued and no distress Head: Normocephalic, without obvious abnormality, atraumatic Neck: no adenopathy, no JVD, supple, symmetrical, trachea midline and thyroid not enlarged,  symmetric, no tenderness/mass/nodules Lymph nodes: Cervical, supraclavicular, and axillary nodes normal. Resp: clear to auscultation bilaterally Back: symmetric, no curvature. ROM normal. No CVA tenderness. Cardio: regular rate and rhythm, S1, S2 normal, no murmur, click, rub or gallop GI: abnormal findings:  distended Extremities: extremities normal, atraumatic, no cyanosis or edema  ECOG PERFORMANCE STATUS: 1 - Symptomatic but completely ambulatory  Blood pressure 139/73, pulse 74, temperature 99.1 F (37.3 C), temperature source Oral, resp. rate 18, height 5\' 8"  (1.727 m), weight 163 lb 3.2 oz (74 kg), SpO2 99 %.  LABORATORY DATA:  Lab Results  Component Value Date   WBC 8.3 12/18/2018   HGB 10.7 (L) 12/18/2018   HCT 33.1 (L) 12/18/2018   MCV 98.5 12/18/2018   PLT 668 (H) 12/18/2018      Chemistry      Component Value Date/Time   NA 141 10/08/2018 0918   NA 141 12/08/2016 0928   K 4.5 10/08/2018 0918   K 4.5 12/08/2016 0928   CL 109 10/08/2018 0918   CO2 25 10/08/2018 0918   CO2 27 12/08/2016 0928   BUN 16 10/08/2018 0918   BUN 14.1 12/08/2016 0928   CREATININE 0.95 10/08/2018 0918   CREATININE 0.9 12/08/2016 0928      Component Value Date/Time   CALCIUM 8.9 10/08/2018 0918   CALCIUM 9.0 12/08/2016 0928   ALKPHOS 68 10/08/2018 0918   ALKPHOS 87 12/08/2016 0928   AST 18 10/08/2018 0918   AST 25 12/08/2016 0928   ALT 42 10/08/2018 0918   ALT 41 12/08/2016 0928   BILITOT 0.8 10/08/2018 0918   BILITOT 0.84 12/08/2016 0928      RADIOGRAPHIC STUDIES:  ASSESSMENT AND PLAN:  This is a very pleasant 73 years old white male with essential thrombocythemia with positive JAK-2 mutation as well as myelodysplastic syndrome. He is currently on treatment with Revlimid 5 mg by mouth daily according to the recommendation from [facility].  He is also on treatment with Aranesp every 3 weeks.  Aranesp was a change it to Procrit initially every 2 weeks and currently on weekly  basis.  He mentioned that he is feeling much better on Procrit weekly  He has been on the weekly treatment with Procrit for the last 15 months.   Repeat CBC today showed the mild anemia as well as thrombocythemia. I recommended for the patient to continue his current treatment with Retacrit as well as anagrelide but will change the dose of anagrelide to 0.5 mg p.o. twice daily. Regarding the abdominal distention, his CT scan showed no concerning findings. The patient will come back for follow-up visit in 3 months for evaluation before his travel back to [place]. He was advised to call immediately if he has any other concerning symptoms in the interval. All questions were answered. The patient knows to call the clinic with any problems, questions or concerns. We can certainly see the  patient much sooner if necessary.   Disclaimer: This note was dictated with voice recognition software. Similar sounding words can inadvertently be transcribed and may not be corrected upon review.

## 2018-12-24 ENCOUNTER — Other Ambulatory Visit: Payer: Medicare Other

## 2018-12-24 ENCOUNTER — Inpatient Hospital Stay: Payer: Medicare Other

## 2018-12-24 ENCOUNTER — Other Ambulatory Visit: Payer: Self-pay

## 2018-12-24 DIAGNOSIS — D461 Refractory anemia with ring sideroblasts: Secondary | ICD-10-CM | POA: Diagnosis not present

## 2018-12-24 DIAGNOSIS — D469 Myelodysplastic syndrome, unspecified: Secondary | ICD-10-CM

## 2018-12-24 LAB — CBC WITH DIFFERENTIAL (CANCER CENTER ONLY)
Abs Immature Granulocytes: 0.07 10*3/uL (ref 0.00–0.07)
Basophils Absolute: 0.2 10*3/uL — ABNORMAL HIGH (ref 0.0–0.1)
Basophils Relative: 2 %
Eosinophils Absolute: 0.9 10*3/uL — ABNORMAL HIGH (ref 0.0–0.5)
Eosinophils Relative: 12 %
HCT: 35.8 % — ABNORMAL LOW (ref 39.0–52.0)
Hemoglobin: 11.7 g/dL — ABNORMAL LOW (ref 13.0–17.0)
Immature Granulocytes: 1 %
Lymphocytes Relative: 22 %
Lymphs Abs: 1.7 10*3/uL (ref 0.7–4.0)
MCH: 31.3 pg (ref 26.0–34.0)
MCHC: 32.7 g/dL (ref 30.0–36.0)
MCV: 95.7 fL (ref 80.0–100.0)
Monocytes Absolute: 0.6 10*3/uL (ref 0.1–1.0)
Monocytes Relative: 8 %
Neutro Abs: 4.3 10*3/uL (ref 1.7–7.7)
Neutrophils Relative %: 55 %
Platelet Count: 586 10*3/uL — ABNORMAL HIGH (ref 150–400)
RBC: 3.74 MIL/uL — ABNORMAL LOW (ref 4.22–5.81)
RDW: 30.2 % — ABNORMAL HIGH (ref 11.5–15.5)
WBC Count: 7.6 10*3/uL (ref 4.0–10.5)
WBC Morphology: INCREASED
nRBC: 0.3 % — ABNORMAL HIGH (ref 0.0–0.2)

## 2018-12-24 NOTE — Progress Notes (Signed)
Will not receive the injection due to Hgb of 11.7, made PT aware and gave a copy of labs.

## 2018-12-31 ENCOUNTER — Inpatient Hospital Stay: Payer: Medicare Other

## 2018-12-31 ENCOUNTER — Other Ambulatory Visit: Payer: Self-pay

## 2018-12-31 ENCOUNTER — Telehealth: Payer: Self-pay | Admitting: Internal Medicine

## 2018-12-31 VITALS — BP 127/81 | HR 66 | Temp 98.5°F | Resp 18

## 2018-12-31 DIAGNOSIS — D461 Refractory anemia with ring sideroblasts: Secondary | ICD-10-CM | POA: Diagnosis not present

## 2018-12-31 DIAGNOSIS — D469 Myelodysplastic syndrome, unspecified: Secondary | ICD-10-CM

## 2018-12-31 LAB — CBC WITH DIFFERENTIAL (CANCER CENTER ONLY)
Abs Immature Granulocytes: 0.02 10*3/uL (ref 0.00–0.07)
Basophils Absolute: 0.2 10*3/uL — ABNORMAL HIGH (ref 0.0–0.1)
Basophils Relative: 3 %
Eosinophils Absolute: 1 10*3/uL — ABNORMAL HIGH (ref 0.0–0.5)
Eosinophils Relative: 12 %
HCT: 31.7 % — ABNORMAL LOW (ref 39.0–52.0)
Hemoglobin: 10.5 g/dL — ABNORMAL LOW (ref 13.0–17.0)
Immature Granulocytes: 0 %
Lymphocytes Relative: 21 %
Lymphs Abs: 1.6 10*3/uL (ref 0.7–4.0)
MCH: 32.3 pg (ref 26.0–34.0)
MCHC: 33.1 g/dL (ref 30.0–36.0)
MCV: 97.5 fL (ref 80.0–100.0)
Monocytes Absolute: 0.9 10*3/uL (ref 0.1–1.0)
Monocytes Relative: 11 %
Neutro Abs: 4.1 10*3/uL (ref 1.7–7.7)
Neutrophils Relative %: 53 %
Platelet Count: 541 10*3/uL — ABNORMAL HIGH (ref 150–400)
RBC: 3.25 MIL/uL — ABNORMAL LOW (ref 4.22–5.81)
RDW: 29.7 % — ABNORMAL HIGH (ref 11.5–15.5)
WBC Count: 7.8 10*3/uL (ref 4.0–10.5)
nRBC: 0 % (ref 0.0–0.2)

## 2018-12-31 MED ORDER — EPOETIN ALFA-EPBX 40000 UNIT/ML IJ SOLN
40000.0000 [IU] | Freq: Once | INTRAMUSCULAR | Status: AC
  Administered 2018-12-31: 11:00:00 40000 [IU] via SUBCUTANEOUS
  Filled 2018-12-31: qty 1

## 2018-12-31 NOTE — Patient Instructions (Signed)

## 2018-12-31 NOTE — Telephone Encounter (Signed)
Patient came to reschedule his lab and injection on 9/28.

## 2019-01-07 ENCOUNTER — Other Ambulatory Visit: Payer: Medicare Other

## 2019-01-07 ENCOUNTER — Ambulatory Visit: Payer: Medicare Other

## 2019-01-08 ENCOUNTER — Inpatient Hospital Stay: Payer: Medicare Other

## 2019-01-08 ENCOUNTER — Other Ambulatory Visit: Payer: Self-pay

## 2019-01-08 VITALS — BP 135/77 | HR 68 | Temp 98.7°F | Resp 18

## 2019-01-08 DIAGNOSIS — D469 Myelodysplastic syndrome, unspecified: Secondary | ICD-10-CM

## 2019-01-08 DIAGNOSIS — D461 Refractory anemia with ring sideroblasts: Secondary | ICD-10-CM | POA: Diagnosis not present

## 2019-01-08 LAB — CBC WITH DIFFERENTIAL (CANCER CENTER ONLY)
Abs Immature Granulocytes: 0.05 10*3/uL (ref 0.00–0.07)
Basophils Absolute: 0.2 10*3/uL — ABNORMAL HIGH (ref 0.0–0.1)
Basophils Relative: 3 %
Eosinophils Absolute: 1.1 10*3/uL — ABNORMAL HIGH (ref 0.0–0.5)
Eosinophils Relative: 13 %
HCT: 31.3 % — ABNORMAL LOW (ref 39.0–52.0)
Hemoglobin: 10.3 g/dL — ABNORMAL LOW (ref 13.0–17.0)
Immature Granulocytes: 1 %
Lymphocytes Relative: 19 %
Lymphs Abs: 1.5 10*3/uL (ref 0.7–4.0)
MCH: 31.4 pg (ref 26.0–34.0)
MCHC: 32.9 g/dL (ref 30.0–36.0)
MCV: 95.4 fL (ref 80.0–100.0)
Monocytes Absolute: 0.8 10*3/uL (ref 0.1–1.0)
Monocytes Relative: 10 %
Neutro Abs: 4.4 10*3/uL (ref 1.7–7.7)
Neutrophils Relative %: 54 %
Platelet Count: 791 10*3/uL — ABNORMAL HIGH (ref 150–400)
RBC: 3.28 MIL/uL — ABNORMAL LOW (ref 4.22–5.81)
RDW: 30.7 % — ABNORMAL HIGH (ref 11.5–15.5)
WBC Count: 8.1 10*3/uL (ref 4.0–10.5)
nRBC: 0.2 % (ref 0.0–0.2)

## 2019-01-08 MED ORDER — EPOETIN ALFA-EPBX 40000 UNIT/ML IJ SOLN
40000.0000 [IU] | Freq: Once | INTRAMUSCULAR | Status: AC
  Administered 2019-01-08: 10:00:00 40000 [IU] via SUBCUTANEOUS
  Filled 2019-01-08: qty 1

## 2019-01-08 NOTE — Patient Instructions (Signed)

## 2019-01-14 ENCOUNTER — Inpatient Hospital Stay: Payer: Medicare Other | Attending: Internal Medicine

## 2019-01-14 ENCOUNTER — Inpatient Hospital Stay: Payer: Medicare Other

## 2019-01-14 ENCOUNTER — Other Ambulatory Visit: Payer: Self-pay

## 2019-01-14 VITALS — BP 128/72 | HR 68 | Temp 98.2°F | Resp 18

## 2019-01-14 DIAGNOSIS — D473 Essential (hemorrhagic) thrombocythemia: Secondary | ICD-10-CM | POA: Diagnosis not present

## 2019-01-14 DIAGNOSIS — D72829 Elevated white blood cell count, unspecified: Secondary | ICD-10-CM | POA: Insufficient documentation

## 2019-01-14 DIAGNOSIS — Z79899 Other long term (current) drug therapy: Secondary | ICD-10-CM | POA: Diagnosis not present

## 2019-01-14 DIAGNOSIS — T451X5A Adverse effect of antineoplastic and immunosuppressive drugs, initial encounter: Secondary | ICD-10-CM | POA: Diagnosis not present

## 2019-01-14 DIAGNOSIS — R531 Weakness: Secondary | ICD-10-CM | POA: Diagnosis not present

## 2019-01-14 DIAGNOSIS — R5383 Other fatigue: Secondary | ICD-10-CM | POA: Diagnosis not present

## 2019-01-14 DIAGNOSIS — D469 Myelodysplastic syndrome, unspecified: Secondary | ICD-10-CM | POA: Diagnosis present

## 2019-01-14 DIAGNOSIS — M2550 Pain in unspecified joint: Secondary | ICD-10-CM | POA: Diagnosis not present

## 2019-01-14 DIAGNOSIS — D6481 Anemia due to antineoplastic chemotherapy: Secondary | ICD-10-CM | POA: Insufficient documentation

## 2019-01-14 DIAGNOSIS — Z8546 Personal history of malignant neoplasm of prostate: Secondary | ICD-10-CM | POA: Diagnosis not present

## 2019-01-14 LAB — CBC WITH DIFFERENTIAL (CANCER CENTER ONLY)
Abs Immature Granulocytes: 0.08 10*3/uL — ABNORMAL HIGH (ref 0.00–0.07)
Basophils Absolute: 0.3 10*3/uL — ABNORMAL HIGH (ref 0.0–0.1)
Basophils Relative: 3 %
Eosinophils Absolute: 0.5 10*3/uL (ref 0.0–0.5)
Eosinophils Relative: 5 %
HCT: 31.5 % — ABNORMAL LOW (ref 39.0–52.0)
Hemoglobin: 10.4 g/dL — ABNORMAL LOW (ref 13.0–17.0)
Immature Granulocytes: 1 %
Lymphocytes Relative: 16 %
Lymphs Abs: 1.6 10*3/uL (ref 0.7–4.0)
MCH: 31 pg (ref 26.0–34.0)
MCHC: 33 g/dL (ref 30.0–36.0)
MCV: 94 fL (ref 80.0–100.0)
Monocytes Absolute: 1 10*3/uL (ref 0.1–1.0)
Monocytes Relative: 10 %
Neutro Abs: 6.5 10*3/uL (ref 1.7–7.7)
Neutrophils Relative %: 65 %
Platelet Count: 271 10*3/uL (ref 150–400)
RBC: 3.35 MIL/uL — ABNORMAL LOW (ref 4.22–5.81)
RDW: 30 % — ABNORMAL HIGH (ref 11.5–15.5)
WBC Count: 10 10*3/uL (ref 4.0–10.5)
nRBC: 0.3 % — ABNORMAL HIGH (ref 0.0–0.2)

## 2019-01-14 MED ORDER — EPOETIN ALFA-EPBX 40000 UNIT/ML IJ SOLN
40000.0000 [IU] | Freq: Once | INTRAMUSCULAR | Status: AC
  Administered 2019-01-14: 40000 [IU] via SUBCUTANEOUS
  Filled 2019-01-14: qty 1

## 2019-01-21 ENCOUNTER — Other Ambulatory Visit: Payer: Self-pay

## 2019-01-21 ENCOUNTER — Inpatient Hospital Stay: Payer: Medicare Other

## 2019-01-21 DIAGNOSIS — D469 Myelodysplastic syndrome, unspecified: Secondary | ICD-10-CM | POA: Diagnosis not present

## 2019-01-21 LAB — CBC WITH DIFFERENTIAL (CANCER CENTER ONLY)
Abs Immature Granulocytes: 0.06 10*3/uL (ref 0.00–0.07)
Basophils Absolute: 0.2 10*3/uL — ABNORMAL HIGH (ref 0.0–0.1)
Basophils Relative: 3 %
Eosinophils Absolute: 0.8 10*3/uL — ABNORMAL HIGH (ref 0.0–0.5)
Eosinophils Relative: 12 %
HCT: 38 % — ABNORMAL LOW (ref 39.0–52.0)
Hemoglobin: 12.2 g/dL — ABNORMAL LOW (ref 13.0–17.0)
Immature Granulocytes: 1 %
Lymphocytes Relative: 25 %
Lymphs Abs: 1.6 10*3/uL (ref 0.7–4.0)
MCH: 30.9 pg (ref 26.0–34.0)
MCHC: 32.1 g/dL (ref 30.0–36.0)
MCV: 96.2 fL (ref 80.0–100.0)
Monocytes Absolute: 0.5 10*3/uL (ref 0.1–1.0)
Monocytes Relative: 7 %
Neutro Abs: 3.4 10*3/uL (ref 1.7–7.7)
Neutrophils Relative %: 52 %
Platelet Count: 224 10*3/uL (ref 150–400)
RBC: 3.95 MIL/uL — ABNORMAL LOW (ref 4.22–5.81)
RDW: 29.5 % — ABNORMAL HIGH (ref 11.5–15.5)
WBC Count: 6.4 10*3/uL (ref 4.0–10.5)
nRBC: 0.3 % — ABNORMAL HIGH (ref 0.0–0.2)

## 2019-01-21 NOTE — Progress Notes (Signed)
hgb 12.2 today no injection needed today

## 2019-01-28 ENCOUNTER — Inpatient Hospital Stay: Payer: Medicare Other

## 2019-01-28 ENCOUNTER — Other Ambulatory Visit: Payer: Self-pay

## 2019-01-28 DIAGNOSIS — D469 Myelodysplastic syndrome, unspecified: Secondary | ICD-10-CM | POA: Diagnosis not present

## 2019-01-28 LAB — CBC WITH DIFFERENTIAL (CANCER CENTER ONLY)
Abs Immature Granulocytes: 0.02 10*3/uL (ref 0.00–0.07)
Basophils Absolute: 0.1 10*3/uL (ref 0.0–0.1)
Basophils Relative: 2 %
Eosinophils Absolute: 0.9 10*3/uL — ABNORMAL HIGH (ref 0.0–0.5)
Eosinophils Relative: 13 %
HCT: 36.5 % — ABNORMAL LOW (ref 39.0–52.0)
Hemoglobin: 11.5 g/dL — ABNORMAL LOW (ref 13.0–17.0)
Immature Granulocytes: 0 %
Lymphocytes Relative: 19 %
Lymphs Abs: 1.3 10*3/uL (ref 0.7–4.0)
MCH: 30.2 pg (ref 26.0–34.0)
MCHC: 31.5 g/dL (ref 30.0–36.0)
MCV: 95.8 fL (ref 80.0–100.0)
Monocytes Absolute: 0.8 10*3/uL (ref 0.1–1.0)
Monocytes Relative: 12 %
Neutro Abs: 3.6 10*3/uL (ref 1.7–7.7)
Neutrophils Relative %: 54 %
Platelet Count: 171 10*3/uL (ref 150–400)
RBC: 3.81 MIL/uL — ABNORMAL LOW (ref 4.22–5.81)
RDW: 29.7 % — ABNORMAL HIGH (ref 11.5–15.5)
WBC Count: 6.6 10*3/uL (ref 4.0–10.5)
nRBC: 0 % (ref 0.0–0.2)

## 2019-01-28 NOTE — Progress Notes (Signed)
No injection today Hgb was 11.5, gave copy of labs.

## 2019-01-30 ENCOUNTER — Telehealth: Payer: Self-pay | Admitting: Internal Medicine

## 2019-01-30 NOTE — Telephone Encounter (Signed)
Returned patient's phone call regarding rescheduling 12/28 appointment, per patient's request appointment has moved to 12/29.

## 2019-02-01 ENCOUNTER — Other Ambulatory Visit: Payer: Self-pay | Admitting: Physician Assistant

## 2019-02-01 DIAGNOSIS — D469 Myelodysplastic syndrome, unspecified: Secondary | ICD-10-CM

## 2019-02-04 ENCOUNTER — Other Ambulatory Visit: Payer: Self-pay

## 2019-02-04 ENCOUNTER — Inpatient Hospital Stay: Payer: Medicare Other

## 2019-02-04 VITALS — BP 131/73 | HR 70 | Temp 98.3°F | Resp 18

## 2019-02-04 DIAGNOSIS — D469 Myelodysplastic syndrome, unspecified: Secondary | ICD-10-CM | POA: Diagnosis not present

## 2019-02-04 LAB — CMP (CANCER CENTER ONLY)
ALT: 32 U/L (ref 0–44)
AST: 16 U/L (ref 15–41)
Albumin: 3.8 g/dL (ref 3.5–5.0)
Alkaline Phosphatase: 69 U/L (ref 38–126)
Anion gap: 6 (ref 5–15)
BUN: 17 mg/dL (ref 8–23)
CO2: 28 mmol/L (ref 22–32)
Calcium: 8.9 mg/dL (ref 8.9–10.3)
Chloride: 108 mmol/L (ref 98–111)
Creatinine: 1.09 mg/dL (ref 0.61–1.24)
GFR, Est AFR Am: >60 mL/min (ref >60)
GFR, Estimated: >60 mL/min (ref >60)
Glucose, Bld: 152 mg/dL — ABNORMAL HIGH (ref 70–99)
Potassium: 4.2 mmol/L (ref 3.5–5.1)
Sodium: 142 mmol/L (ref 135–145)
Total Bilirubin: 0.8 mg/dL (ref 0.3–1.2)
Total Protein: 6.7 g/dL (ref 6.5–8.1)

## 2019-02-04 LAB — CBC WITH DIFFERENTIAL (CANCER CENTER ONLY)
Abs Immature Granulocytes: 0.02 10*3/uL (ref 0.00–0.07)
Basophils Absolute: 0.2 10*3/uL — ABNORMAL HIGH (ref 0.0–0.1)
Basophils Relative: 3 %
Eosinophils Absolute: 0.7 10*3/uL — ABNORMAL HIGH (ref 0.0–0.5)
Eosinophils Relative: 9 %
HCT: 32.5 % — ABNORMAL LOW (ref 39.0–52.0)
Hemoglobin: 10.6 g/dL — ABNORMAL LOW (ref 13.0–17.0)
Immature Granulocytes: 0 %
Lymphocytes Relative: 15 %
Lymphs Abs: 1.2 10*3/uL (ref 0.7–4.0)
MCH: 30.5 pg (ref 26.0–34.0)
MCHC: 32.6 g/dL (ref 30.0–36.0)
MCV: 93.4 fL (ref 80.0–100.0)
Monocytes Absolute: 0.6 10*3/uL (ref 0.1–1.0)
Monocytes Relative: 8 %
Neutro Abs: 5 10*3/uL (ref 1.7–7.7)
Neutrophils Relative %: 65 %
Platelet Count: 158 10*3/uL (ref 150–400)
RBC: 3.48 MIL/uL — ABNORMAL LOW (ref 4.22–5.81)
RDW: 28.4 % — ABNORMAL HIGH (ref 11.5–15.5)
WBC Count: 7.7 10*3/uL (ref 4.0–10.5)
nRBC: 0 % (ref 0.0–0.2)

## 2019-02-04 MED ORDER — EPOETIN ALFA-EPBX 40000 UNIT/ML IJ SOLN
40000.0000 [IU] | Freq: Once | INTRAMUSCULAR | Status: AC
  Administered 2019-02-04: 40000 [IU] via SUBCUTANEOUS
  Filled 2019-02-04: qty 1

## 2019-02-11 ENCOUNTER — Inpatient Hospital Stay: Payer: Medicare Other

## 2019-02-11 ENCOUNTER — Other Ambulatory Visit: Payer: Self-pay

## 2019-02-11 ENCOUNTER — Inpatient Hospital Stay: Payer: Medicare Other | Attending: Internal Medicine

## 2019-02-11 VITALS — BP 136/75 | HR 70 | Temp 97.6°F | Resp 18

## 2019-02-11 DIAGNOSIS — Z8719 Personal history of other diseases of the digestive system: Secondary | ICD-10-CM | POA: Diagnosis not present

## 2019-02-11 DIAGNOSIS — Z86010 Personal history of colonic polyps: Secondary | ICD-10-CM | POA: Insufficient documentation

## 2019-02-11 DIAGNOSIS — M25542 Pain in joints of left hand: Secondary | ICD-10-CM | POA: Insufficient documentation

## 2019-02-11 DIAGNOSIS — Z79899 Other long term (current) drug therapy: Secondary | ICD-10-CM | POA: Insufficient documentation

## 2019-02-11 DIAGNOSIS — T451X5A Adverse effect of antineoplastic and immunosuppressive drugs, initial encounter: Secondary | ICD-10-CM | POA: Insufficient documentation

## 2019-02-11 DIAGNOSIS — R5383 Other fatigue: Secondary | ICD-10-CM | POA: Insufficient documentation

## 2019-02-11 DIAGNOSIS — D72829 Elevated white blood cell count, unspecified: Secondary | ICD-10-CM | POA: Diagnosis not present

## 2019-02-11 DIAGNOSIS — D469 Myelodysplastic syndrome, unspecified: Secondary | ICD-10-CM

## 2019-02-11 DIAGNOSIS — D473 Essential (hemorrhagic) thrombocythemia: Secondary | ICD-10-CM | POA: Diagnosis not present

## 2019-02-11 DIAGNOSIS — M25541 Pain in joints of right hand: Secondary | ICD-10-CM | POA: Diagnosis not present

## 2019-02-11 DIAGNOSIS — R531 Weakness: Secondary | ICD-10-CM | POA: Diagnosis not present

## 2019-02-11 DIAGNOSIS — D461 Refractory anemia with ring sideroblasts: Secondary | ICD-10-CM | POA: Insufficient documentation

## 2019-02-11 DIAGNOSIS — Z8546 Personal history of malignant neoplasm of prostate: Secondary | ICD-10-CM | POA: Insufficient documentation

## 2019-02-11 LAB — CBC WITH DIFFERENTIAL (CANCER CENTER ONLY)
Abs Immature Granulocytes: 0.09 10*3/uL — ABNORMAL HIGH (ref 0.00–0.07)
Basophils Absolute: 0.3 10*3/uL — ABNORMAL HIGH (ref 0.0–0.1)
Basophils Relative: 3 %
Eosinophils Absolute: 0.4 10*3/uL (ref 0.0–0.5)
Eosinophils Relative: 4 %
HCT: 31.8 % — ABNORMAL LOW (ref 39.0–52.0)
Hemoglobin: 10.4 g/dL — ABNORMAL LOW (ref 13.0–17.0)
Immature Granulocytes: 1 %
Lymphocytes Relative: 23 %
Lymphs Abs: 2.1 10*3/uL (ref 0.7–4.0)
MCH: 29.8 pg (ref 26.0–34.0)
MCHC: 32.7 g/dL (ref 30.0–36.0)
MCV: 91.1 fL (ref 80.0–100.0)
Monocytes Absolute: 1 10*3/uL (ref 0.1–1.0)
Monocytes Relative: 12 %
Neutro Abs: 5.1 10*3/uL (ref 1.7–7.7)
Neutrophils Relative %: 57 %
Platelet Count: 389 10*3/uL (ref 150–400)
RBC: 3.49 MIL/uL — ABNORMAL LOW (ref 4.22–5.81)
RDW: 29 % — ABNORMAL HIGH (ref 11.5–15.5)
WBC Count: 8.9 10*3/uL (ref 4.0–10.5)
nRBC: 0.3 % — ABNORMAL HIGH (ref 0.0–0.2)

## 2019-02-11 LAB — CMP (CANCER CENTER ONLY)
ALT: 29 U/L (ref 0–44)
AST: 19 U/L (ref 15–41)
Albumin: 4 g/dL (ref 3.5–5.0)
Alkaline Phosphatase: 70 U/L (ref 38–126)
Anion gap: 6 (ref 5–15)
BUN: 20 mg/dL (ref 8–23)
CO2: 26 mmol/L (ref 22–32)
Calcium: 9.3 mg/dL (ref 8.9–10.3)
Chloride: 110 mmol/L (ref 98–111)
Creatinine: 0.98 mg/dL (ref 0.61–1.24)
GFR, Est AFR Am: >60 mL/min (ref >60)
GFR, Estimated: >60 mL/min (ref >60)
Glucose, Bld: 93 mg/dL (ref 70–99)
Potassium: 4.7 mmol/L (ref 3.5–5.1)
Sodium: 142 mmol/L (ref 135–145)
Total Bilirubin: 0.7 mg/dL (ref 0.3–1.2)
Total Protein: 7 g/dL (ref 6.5–8.1)

## 2019-02-11 MED ORDER — EPOETIN ALFA-EPBX 40000 UNIT/ML IJ SOLN
INTRAMUSCULAR | Status: AC
  Filled 2019-02-11: qty 1

## 2019-02-11 MED ORDER — EPOETIN ALFA-EPBX 40000 UNIT/ML IJ SOLN
40000.0000 [IU] | Freq: Once | INTRAMUSCULAR | Status: AC
  Administered 2019-02-11: 11:00:00 40000 [IU] via SUBCUTANEOUS

## 2019-02-11 NOTE — Patient Instructions (Signed)

## 2019-02-18 ENCOUNTER — Inpatient Hospital Stay: Payer: Medicare Other

## 2019-02-18 ENCOUNTER — Other Ambulatory Visit: Payer: Self-pay

## 2019-02-18 VITALS — BP 128/72 | HR 72 | Temp 98.2°F | Resp 18

## 2019-02-18 DIAGNOSIS — D469 Myelodysplastic syndrome, unspecified: Secondary | ICD-10-CM

## 2019-02-18 DIAGNOSIS — D461 Refractory anemia with ring sideroblasts: Secondary | ICD-10-CM | POA: Diagnosis not present

## 2019-02-18 LAB — CMP (CANCER CENTER ONLY)
ALT: 25 U/L (ref 0–44)
AST: 17 U/L (ref 15–41)
Albumin: 4.1 g/dL (ref 3.5–5.0)
Alkaline Phosphatase: 73 U/L (ref 38–126)
Anion gap: 8 (ref 5–15)
BUN: 17 mg/dL (ref 8–23)
CO2: 26 mmol/L (ref 22–32)
Calcium: 8.8 mg/dL — ABNORMAL LOW (ref 8.9–10.3)
Chloride: 107 mmol/L (ref 98–111)
Creatinine: 1.06 mg/dL (ref 0.61–1.24)
GFR, Est AFR Am: >60 mL/min (ref >60)
GFR, Estimated: >60 mL/min (ref >60)
Glucose, Bld: 131 mg/dL — ABNORMAL HIGH (ref 70–99)
Potassium: 4.7 mmol/L (ref 3.5–5.1)
Sodium: 141 mmol/L (ref 135–145)
Total Bilirubin: 1 mg/dL (ref 0.3–1.2)
Total Protein: 7.1 g/dL (ref 6.5–8.1)

## 2019-02-18 LAB — CBC WITH DIFFERENTIAL (CANCER CENTER ONLY)
Abs Immature Granulocytes: 0.05 10*3/uL (ref 0.00–0.07)
Basophils Absolute: 0.2 10*3/uL — ABNORMAL HIGH (ref 0.0–0.1)
Basophils Relative: 2 %
Eosinophils Absolute: 0.6 10*3/uL — ABNORMAL HIGH (ref 0.0–0.5)
Eosinophils Relative: 9 %
HCT: 34 % — ABNORMAL LOW (ref 39.0–52.0)
Hemoglobin: 10.9 g/dL — ABNORMAL LOW (ref 13.0–17.0)
Immature Granulocytes: 1 %
Lymphocytes Relative: 16 %
Lymphs Abs: 1.2 10*3/uL (ref 0.7–4.0)
MCH: 29.9 pg (ref 26.0–34.0)
MCHC: 32.1 g/dL (ref 30.0–36.0)
MCV: 93.2 fL (ref 80.0–100.0)
Monocytes Absolute: 0.5 10*3/uL (ref 0.1–1.0)
Monocytes Relative: 6 %
Neutro Abs: 4.9 10*3/uL (ref 1.7–7.7)
Neutrophils Relative %: 66 %
Platelet Count: 268 10*3/uL (ref 150–400)
RBC: 3.65 MIL/uL — ABNORMAL LOW (ref 4.22–5.81)
RDW: 29.6 % — ABNORMAL HIGH (ref 11.5–15.5)
WBC Count: 7.4 10*3/uL (ref 4.0–10.5)
nRBC: 0.3 % — ABNORMAL HIGH (ref 0.0–0.2)

## 2019-02-18 MED ORDER — EPOETIN ALFA-EPBX 40000 UNIT/ML IJ SOLN
INTRAMUSCULAR | Status: AC
  Filled 2019-02-18: qty 1

## 2019-02-18 MED ORDER — EPOETIN ALFA-EPBX 40000 UNIT/ML IJ SOLN
40000.0000 [IU] | Freq: Once | INTRAMUSCULAR | Status: AC
  Administered 2019-02-18: 40000 [IU] via SUBCUTANEOUS

## 2019-02-25 ENCOUNTER — Inpatient Hospital Stay: Payer: Medicare Other

## 2019-02-25 ENCOUNTER — Other Ambulatory Visit: Payer: Self-pay

## 2019-02-25 VITALS — BP 132/72 | HR 72 | Temp 98.2°F | Resp 18

## 2019-02-25 DIAGNOSIS — D469 Myelodysplastic syndrome, unspecified: Secondary | ICD-10-CM

## 2019-02-25 DIAGNOSIS — D461 Refractory anemia with ring sideroblasts: Secondary | ICD-10-CM | POA: Diagnosis not present

## 2019-02-25 LAB — CBC WITH DIFFERENTIAL (CANCER CENTER ONLY)
Abs Immature Granulocytes: 0.06 10*3/uL (ref 0.00–0.07)
Basophils Absolute: 0.2 10*3/uL — ABNORMAL HIGH (ref 0.0–0.1)
Basophils Relative: 2 %
Eosinophils Absolute: 0.9 10*3/uL — ABNORMAL HIGH (ref 0.0–0.5)
Eosinophils Relative: 11 %
HCT: 33.9 % — ABNORMAL LOW (ref 39.0–52.0)
Hemoglobin: 10.8 g/dL — ABNORMAL LOW (ref 13.0–17.0)
Immature Granulocytes: 1 %
Lymphocytes Relative: 17 %
Lymphs Abs: 1.3 10*3/uL (ref 0.7–4.0)
MCH: 29.8 pg (ref 26.0–34.0)
MCHC: 31.9 g/dL (ref 30.0–36.0)
MCV: 93.4 fL (ref 80.0–100.0)
Monocytes Absolute: 0.7 10*3/uL (ref 0.1–1.0)
Monocytes Relative: 9 %
Neutro Abs: 4.6 10*3/uL (ref 1.7–7.7)
Neutrophils Relative %: 60 %
Platelet Count: 242 10*3/uL (ref 150–400)
RBC: 3.63 MIL/uL — ABNORMAL LOW (ref 4.22–5.81)
RDW: 30.4 % — ABNORMAL HIGH (ref 11.5–15.5)
WBC Count: 8 10*3/uL (ref 4.0–10.5)
WBC Morphology: INCREASED
nRBC: 0.3 % — ABNORMAL HIGH (ref 0.0–0.2)

## 2019-02-25 LAB — CMP (CANCER CENTER ONLY)
ALT: 31 U/L (ref 0–44)
AST: 16 U/L (ref 15–41)
Albumin: 3.9 g/dL (ref 3.5–5.0)
Alkaline Phosphatase: 64 U/L (ref 38–126)
Anion gap: 9 (ref 5–15)
BUN: 18 mg/dL (ref 8–23)
CO2: 25 mmol/L (ref 22–32)
Calcium: 8.7 mg/dL — ABNORMAL LOW (ref 8.9–10.3)
Chloride: 110 mmol/L (ref 98–111)
Creatinine: 1.07 mg/dL (ref 0.61–1.24)
GFR, Est AFR Am: >60 mL/min (ref >60)
GFR, Estimated: >60 mL/min (ref >60)
Glucose, Bld: 134 mg/dL — ABNORMAL HIGH (ref 70–99)
Potassium: 4.7 mmol/L (ref 3.5–5.1)
Sodium: 144 mmol/L (ref 135–145)
Total Bilirubin: 1.1 mg/dL (ref 0.3–1.2)
Total Protein: 6.8 g/dL (ref 6.5–8.1)

## 2019-02-25 MED ORDER — EPOETIN ALFA-EPBX 40000 UNIT/ML IJ SOLN
INTRAMUSCULAR | Status: AC
  Filled 2019-02-25: qty 1

## 2019-02-25 MED ORDER — EPOETIN ALFA-EPBX 40000 UNIT/ML IJ SOLN
40000.0000 [IU] | Freq: Once | INTRAMUSCULAR | Status: AC
  Administered 2019-02-25: 40000 [IU] via SUBCUTANEOUS

## 2019-02-25 NOTE — Patient Instructions (Signed)

## 2019-03-04 ENCOUNTER — Inpatient Hospital Stay: Payer: Medicare Other

## 2019-03-04 ENCOUNTER — Other Ambulatory Visit: Payer: Self-pay

## 2019-03-04 VITALS — BP 135/71 | HR 68 | Temp 97.9°F | Resp 18

## 2019-03-04 DIAGNOSIS — D461 Refractory anemia with ring sideroblasts: Secondary | ICD-10-CM | POA: Diagnosis not present

## 2019-03-04 DIAGNOSIS — D469 Myelodysplastic syndrome, unspecified: Secondary | ICD-10-CM

## 2019-03-04 LAB — CBC WITH DIFFERENTIAL (CANCER CENTER ONLY)
Abs Immature Granulocytes: 0.03 10*3/uL (ref 0.00–0.07)
Basophils Absolute: 0.1 10*3/uL (ref 0.0–0.1)
Basophils Relative: 2 %
Eosinophils Absolute: 1.1 10*3/uL — ABNORMAL HIGH (ref 0.0–0.5)
Eosinophils Relative: 15 %
HCT: 33.8 % — ABNORMAL LOW (ref 39.0–52.0)
Hemoglobin: 10.9 g/dL — ABNORMAL LOW (ref 13.0–17.0)
Immature Granulocytes: 0 %
Lymphocytes Relative: 22 %
Lymphs Abs: 1.6 10*3/uL (ref 0.7–4.0)
MCH: 29.6 pg (ref 26.0–34.0)
MCHC: 32.2 g/dL (ref 30.0–36.0)
MCV: 91.8 fL (ref 80.0–100.0)
Monocytes Absolute: 0.7 10*3/uL (ref 0.1–1.0)
Monocytes Relative: 10 %
Neutro Abs: 3.7 10*3/uL (ref 1.7–7.7)
Neutrophils Relative %: 51 %
Platelet Count: 246 10*3/uL (ref 150–400)
RBC: 3.68 MIL/uL — ABNORMAL LOW (ref 4.22–5.81)
RDW: 31.3 % — ABNORMAL HIGH (ref 11.5–15.5)
WBC Count: 7.2 10*3/uL (ref 4.0–10.5)
nRBC: 0 % (ref 0.0–0.2)

## 2019-03-04 LAB — CMP (CANCER CENTER ONLY)
ALT: 40 U/L (ref 0–44)
AST: 23 U/L (ref 15–41)
Albumin: 4.1 g/dL (ref 3.5–5.0)
Alkaline Phosphatase: 68 U/L (ref 38–126)
Anion gap: 7 (ref 5–15)
BUN: 16 mg/dL (ref 8–23)
CO2: 26 mmol/L (ref 22–32)
Calcium: 8.9 mg/dL (ref 8.9–10.3)
Chloride: 110 mmol/L (ref 98–111)
Creatinine: 1.05 mg/dL (ref 0.61–1.24)
GFR, Est AFR Am: >60 mL/min (ref >60)
GFR, Estimated: >60 mL/min (ref >60)
Glucose, Bld: 99 mg/dL (ref 70–99)
Potassium: 4.3 mmol/L (ref 3.5–5.1)
Sodium: 143 mmol/L (ref 135–145)
Total Bilirubin: 1.1 mg/dL (ref 0.3–1.2)
Total Protein: 6.9 g/dL (ref 6.5–8.1)

## 2019-03-04 MED ORDER — EPOETIN ALFA-EPBX 40000 UNIT/ML IJ SOLN
40000.0000 [IU] | Freq: Once | INTRAMUSCULAR | Status: AC
  Administered 2019-03-04: 40000 [IU] via SUBCUTANEOUS

## 2019-03-04 MED ORDER — EPOETIN ALFA-EPBX 40000 UNIT/ML IJ SOLN
INTRAMUSCULAR | Status: AC
  Filled 2019-03-04: qty 1

## 2019-03-04 NOTE — Patient Instructions (Signed)

## 2019-03-11 ENCOUNTER — Other Ambulatory Visit: Payer: Self-pay

## 2019-03-11 ENCOUNTER — Inpatient Hospital Stay: Payer: Medicare Other

## 2019-03-11 VITALS — BP 139/73 | HR 68 | Temp 98.2°F | Resp 18

## 2019-03-11 DIAGNOSIS — D461 Refractory anemia with ring sideroblasts: Secondary | ICD-10-CM | POA: Diagnosis not present

## 2019-03-11 DIAGNOSIS — D469 Myelodysplastic syndrome, unspecified: Secondary | ICD-10-CM

## 2019-03-11 LAB — CMP (CANCER CENTER ONLY)
ALT: 38 U/L (ref 0–44)
AST: 18 U/L (ref 15–41)
Albumin: 4.1 g/dL (ref 3.5–5.0)
Alkaline Phosphatase: 57 U/L (ref 38–126)
Anion gap: 8 (ref 5–15)
BUN: 19 mg/dL (ref 8–23)
CO2: 25 mmol/L (ref 22–32)
Calcium: 9 mg/dL (ref 8.9–10.3)
Chloride: 110 mmol/L (ref 98–111)
Creatinine: 0.93 mg/dL (ref 0.61–1.24)
GFR, Est AFR Am: >60 mL/min (ref >60)
GFR, Estimated: >60 mL/min (ref >60)
Glucose, Bld: 91 mg/dL (ref 70–99)
Potassium: 4.3 mmol/L (ref 3.5–5.1)
Sodium: 143 mmol/L (ref 135–145)
Total Bilirubin: 1.2 mg/dL (ref 0.3–1.2)
Total Protein: 6.9 g/dL (ref 6.5–8.1)

## 2019-03-11 LAB — CBC WITH DIFFERENTIAL (CANCER CENTER ONLY)
Abs Immature Granulocytes: 0.01 10*3/uL (ref 0.00–0.07)
Basophils Absolute: 0.3 10*3/uL — ABNORMAL HIGH (ref 0.0–0.1)
Basophils Relative: 4 %
Eosinophils Absolute: 0.3 10*3/uL (ref 0.0–0.5)
Eosinophils Relative: 5 %
HCT: 32.5 % — ABNORMAL LOW (ref 39.0–52.0)
Hemoglobin: 10.5 g/dL — ABNORMAL LOW (ref 13.0–17.0)
Immature Granulocytes: 0 %
Lymphocytes Relative: 25 %
Lymphs Abs: 1.8 10*3/uL (ref 0.7–4.0)
MCH: 29.7 pg (ref 26.0–34.0)
MCHC: 32.3 g/dL (ref 30.0–36.0)
MCV: 92.1 fL (ref 80.0–100.0)
Monocytes Absolute: 0.6 10*3/uL (ref 0.1–1.0)
Monocytes Relative: 9 %
Neutro Abs: 4 10*3/uL (ref 1.7–7.7)
Neutrophils Relative %: 57 %
Platelet Count: 340 10*3/uL (ref 150–400)
RBC: 3.53 MIL/uL — ABNORMAL LOW (ref 4.22–5.81)
RDW: 30.9 % — ABNORMAL HIGH (ref 11.5–15.5)
WBC Count: 7 10*3/uL (ref 4.0–10.5)
nRBC: 0 % (ref 0.0–0.2)

## 2019-03-11 MED ORDER — EPOETIN ALFA-EPBX 40000 UNIT/ML IJ SOLN
40000.0000 [IU] | Freq: Once | INTRAMUSCULAR | Status: AC
  Administered 2019-03-11: 40000 [IU] via SUBCUTANEOUS

## 2019-03-11 MED ORDER — EPOETIN ALFA-EPBX 40000 UNIT/ML IJ SOLN
INTRAMUSCULAR | Status: AC
  Filled 2019-03-11: qty 1

## 2019-03-11 NOTE — Patient Instructions (Signed)

## 2019-03-18 ENCOUNTER — Other Ambulatory Visit: Payer: Self-pay

## 2019-03-18 ENCOUNTER — Inpatient Hospital Stay: Payer: Medicare Other

## 2019-03-18 ENCOUNTER — Inpatient Hospital Stay: Payer: Medicare Other | Attending: Internal Medicine

## 2019-03-18 VITALS — BP 141/76 | HR 67 | Temp 97.8°F | Resp 16

## 2019-03-18 DIAGNOSIS — T451X5A Adverse effect of antineoplastic and immunosuppressive drugs, initial encounter: Secondary | ICD-10-CM | POA: Diagnosis not present

## 2019-03-18 DIAGNOSIS — D72829 Elevated white blood cell count, unspecified: Secondary | ICD-10-CM | POA: Diagnosis not present

## 2019-03-18 DIAGNOSIS — D461 Refractory anemia with ring sideroblasts: Secondary | ICD-10-CM | POA: Insufficient documentation

## 2019-03-18 DIAGNOSIS — D473 Essential (hemorrhagic) thrombocythemia: Secondary | ICD-10-CM | POA: Diagnosis not present

## 2019-03-18 DIAGNOSIS — D469 Myelodysplastic syndrome, unspecified: Secondary | ICD-10-CM

## 2019-03-18 DIAGNOSIS — Z9079 Acquired absence of other genital organ(s): Secondary | ICD-10-CM | POA: Diagnosis not present

## 2019-03-18 DIAGNOSIS — Z79899 Other long term (current) drug therapy: Secondary | ICD-10-CM | POA: Diagnosis not present

## 2019-03-18 DIAGNOSIS — Z8719 Personal history of other diseases of the digestive system: Secondary | ICD-10-CM | POA: Insufficient documentation

## 2019-03-18 DIAGNOSIS — Z8546 Personal history of malignant neoplasm of prostate: Secondary | ICD-10-CM | POA: Insufficient documentation

## 2019-03-18 DIAGNOSIS — R5383 Other fatigue: Secondary | ICD-10-CM | POA: Insufficient documentation

## 2019-03-18 LAB — CBC WITH DIFFERENTIAL (CANCER CENTER ONLY)
Abs Immature Granulocytes: 0.19 10*3/uL — ABNORMAL HIGH (ref 0.00–0.07)
Basophils Absolute: 0.2 10*3/uL — ABNORMAL HIGH (ref 0.0–0.1)
Basophils Relative: 2 %
Eosinophils Absolute: 0.7 10*3/uL — ABNORMAL HIGH (ref 0.0–0.5)
Eosinophils Relative: 10 %
HCT: 31.1 % — ABNORMAL LOW (ref 39.0–52.0)
Hemoglobin: 10 g/dL — ABNORMAL LOW (ref 13.0–17.0)
Immature Granulocytes: 3 %
Lymphocytes Relative: 24 %
Lymphs Abs: 1.7 10*3/uL (ref 0.7–4.0)
MCH: 28.9 pg (ref 26.0–34.0)
MCHC: 32.2 g/dL (ref 30.0–36.0)
MCV: 89.9 fL (ref 80.0–100.0)
Monocytes Absolute: 0.4 10*3/uL (ref 0.1–1.0)
Monocytes Relative: 6 %
Neutro Abs: 4.1 10*3/uL (ref 1.7–7.7)
Neutrophils Relative %: 55 %
Platelet Count: 286 10*3/uL (ref 150–400)
RBC: 3.46 MIL/uL — ABNORMAL LOW (ref 4.22–5.81)
RDW: 32.9 % — ABNORMAL HIGH (ref 11.5–15.5)
WBC Count: 7.3 10*3/uL (ref 4.0–10.5)
nRBC: 0.6 % — ABNORMAL HIGH (ref 0.0–0.2)

## 2019-03-18 LAB — CMP (CANCER CENTER ONLY)
ALT: 35 U/L (ref 0–44)
AST: 22 U/L (ref 15–41)
Albumin: 4 g/dL (ref 3.5–5.0)
Alkaline Phosphatase: 59 U/L (ref 38–126)
Anion gap: 4 — ABNORMAL LOW (ref 5–15)
BUN: 20 mg/dL (ref 8–23)
CO2: 28 mmol/L (ref 22–32)
Calcium: 9 mg/dL (ref 8.9–10.3)
Chloride: 111 mmol/L (ref 98–111)
Creatinine: 1.05 mg/dL (ref 0.61–1.24)
GFR, Est AFR Am: >60 mL/min (ref >60)
GFR, Estimated: >60 mL/min (ref >60)
Glucose, Bld: 93 mg/dL (ref 70–99)
Potassium: 4.4 mmol/L (ref 3.5–5.1)
Sodium: 143 mmol/L (ref 135–145)
Total Bilirubin: 1 mg/dL (ref 0.3–1.2)
Total Protein: 6.8 g/dL (ref 6.5–8.1)

## 2019-03-18 MED ORDER — EPOETIN ALFA-EPBX 10000 UNIT/ML IJ SOLN
INTRAMUSCULAR | Status: AC
  Filled 2019-03-18: qty 1

## 2019-03-18 MED ORDER — EPOETIN ALFA-EPBX 40000 UNIT/ML IJ SOLN
40000.0000 [IU] | Freq: Once | INTRAMUSCULAR | Status: AC
  Administered 2019-03-18: 40000 [IU] via SUBCUTANEOUS

## 2019-03-18 MED ORDER — EPOETIN ALFA-EPBX 40000 UNIT/ML IJ SOLN
INTRAMUSCULAR | Status: AC
  Filled 2019-03-18: qty 1

## 2019-03-18 NOTE — Patient Instructions (Signed)

## 2019-03-18 NOTE — Progress Notes (Signed)
HGB is 10.0 today per Lab results. Labs not showing on chart but verified with lab tech

## 2019-03-25 ENCOUNTER — Inpatient Hospital Stay: Payer: Medicare Other

## 2019-03-25 ENCOUNTER — Other Ambulatory Visit: Payer: Self-pay

## 2019-03-25 VITALS — BP 143/76 | HR 69 | Temp 97.8°F | Resp 18

## 2019-03-25 DIAGNOSIS — D469 Myelodysplastic syndrome, unspecified: Secondary | ICD-10-CM

## 2019-03-25 DIAGNOSIS — D461 Refractory anemia with ring sideroblasts: Secondary | ICD-10-CM | POA: Diagnosis not present

## 2019-03-25 LAB — CBC WITH DIFFERENTIAL (CANCER CENTER ONLY)
Abs Immature Granulocytes: 0.1 10*3/uL — ABNORMAL HIGH (ref 0.00–0.07)
Basophils Absolute: 0.2 10*3/uL — ABNORMAL HIGH (ref 0.0–0.1)
Basophils Relative: 2 %
Eosinophils Absolute: 1.4 10*3/uL — ABNORMAL HIGH (ref 0.0–0.5)
Eosinophils Relative: 12 %
HCT: 32.4 % — ABNORMAL LOW (ref 39.0–52.0)
Hemoglobin: 10.3 g/dL — ABNORMAL LOW (ref 13.0–17.0)
Immature Granulocytes: 1 %
Lymphocytes Relative: 18 %
Lymphs Abs: 2.2 10*3/uL (ref 0.7–4.0)
MCH: 28.8 pg (ref 26.0–34.0)
MCHC: 31.8 g/dL (ref 30.0–36.0)
MCV: 90.5 fL (ref 80.0–100.0)
Monocytes Absolute: 1.2 10*3/uL — ABNORMAL HIGH (ref 0.1–1.0)
Monocytes Relative: 10 %
Neutro Abs: 6.9 10*3/uL (ref 1.7–7.7)
Neutrophils Relative %: 57 %
Platelet Count: 572 10*3/uL — ABNORMAL HIGH (ref 150–400)
RBC: 3.58 MIL/uL — ABNORMAL LOW (ref 4.22–5.81)
RDW: 32.5 % — ABNORMAL HIGH (ref 11.5–15.5)
WBC Count: 11.9 10*3/uL — ABNORMAL HIGH (ref 4.0–10.5)
nRBC: 0.3 % — ABNORMAL HIGH (ref 0.0–0.2)

## 2019-03-25 LAB — CMP (CANCER CENTER ONLY)
ALT: 28 U/L (ref 0–44)
AST: 17 U/L (ref 15–41)
Albumin: 4 g/dL (ref 3.5–5.0)
Alkaline Phosphatase: 51 U/L (ref 38–126)
Anion gap: 5 (ref 5–15)
BUN: 14 mg/dL (ref 8–23)
CO2: 26 mmol/L (ref 22–32)
Calcium: 8.4 mg/dL — ABNORMAL LOW (ref 8.9–10.3)
Chloride: 111 mmol/L (ref 98–111)
Creatinine: 0.99 mg/dL (ref 0.61–1.24)
GFR, Est AFR Am: >60 mL/min (ref >60)
GFR, Estimated: >60 mL/min (ref >60)
Glucose, Bld: 94 mg/dL (ref 70–99)
Potassium: 4.2 mmol/L (ref 3.5–5.1)
Sodium: 142 mmol/L (ref 135–145)
Total Bilirubin: 1 mg/dL (ref 0.3–1.2)
Total Protein: 6.7 g/dL (ref 6.5–8.1)

## 2019-03-25 MED ORDER — EPOETIN ALFA-EPBX 40000 UNIT/ML IJ SOLN
40000.0000 [IU] | Freq: Once | INTRAMUSCULAR | Status: AC
  Administered 2019-03-25: 40000 [IU] via SUBCUTANEOUS

## 2019-03-25 MED ORDER — EPOETIN ALFA-EPBX 40000 UNIT/ML IJ SOLN
INTRAMUSCULAR | Status: AC
  Filled 2019-03-25: qty 1

## 2019-03-25 NOTE — Patient Instructions (Signed)

## 2019-04-01 ENCOUNTER — Other Ambulatory Visit: Payer: Self-pay

## 2019-04-01 ENCOUNTER — Inpatient Hospital Stay: Payer: Medicare Other

## 2019-04-01 VITALS — BP 130/79 | HR 70 | Temp 98.1°F | Resp 18

## 2019-04-01 DIAGNOSIS — D469 Myelodysplastic syndrome, unspecified: Secondary | ICD-10-CM

## 2019-04-01 DIAGNOSIS — D461 Refractory anemia with ring sideroblasts: Secondary | ICD-10-CM | POA: Diagnosis not present

## 2019-04-01 LAB — CBC WITH DIFFERENTIAL (CANCER CENTER ONLY)
Abs Immature Granulocytes: 0.03 10*3/uL (ref 0.00–0.07)
Basophils Absolute: 0.2 10*3/uL — ABNORMAL HIGH (ref 0.0–0.1)
Basophils Relative: 2 %
Eosinophils Absolute: 1.1 10*3/uL — ABNORMAL HIGH (ref 0.0–0.5)
Eosinophils Relative: 14 %
HCT: 32.6 % — ABNORMAL LOW (ref 39.0–52.0)
Hemoglobin: 10.7 g/dL — ABNORMAL LOW (ref 13.0–17.0)
Immature Granulocytes: 0 %
Lymphocytes Relative: 25 %
Lymphs Abs: 1.9 10*3/uL (ref 0.7–4.0)
MCH: 29.8 pg (ref 26.0–34.0)
MCHC: 32.8 g/dL (ref 30.0–36.0)
MCV: 90.8 fL (ref 80.0–100.0)
Monocytes Absolute: 0.7 10*3/uL (ref 0.1–1.0)
Monocytes Relative: 9 %
Neutro Abs: 3.7 10*3/uL (ref 1.7–7.7)
Neutrophils Relative %: 50 %
Platelet Count: 256 10*3/uL (ref 150–400)
RBC: 3.59 MIL/uL — ABNORMAL LOW (ref 4.22–5.81)
RDW: 31.9 % — ABNORMAL HIGH (ref 11.5–15.5)
WBC Count: 7.5 10*3/uL (ref 4.0–10.5)
nRBC: 0 % (ref 0.0–0.2)

## 2019-04-01 LAB — CMP (CANCER CENTER ONLY)
ALT: 44 U/L (ref 0–44)
AST: 24 U/L (ref 15–41)
Albumin: 4.2 g/dL (ref 3.5–5.0)
Alkaline Phosphatase: 55 U/L (ref 38–126)
Anion gap: 8 (ref 5–15)
BUN: 20 mg/dL (ref 8–23)
CO2: 25 mmol/L (ref 22–32)
Calcium: 9.1 mg/dL (ref 8.9–10.3)
Chloride: 109 mmol/L (ref 98–111)
Creatinine: 0.98 mg/dL (ref 0.61–1.24)
GFR, Est AFR Am: >60 mL/min (ref >60)
GFR, Estimated: >60 mL/min (ref >60)
Glucose, Bld: 100 mg/dL — ABNORMAL HIGH (ref 70–99)
Potassium: 4.2 mmol/L (ref 3.5–5.1)
Sodium: 142 mmol/L (ref 135–145)
Total Bilirubin: 1.5 mg/dL — ABNORMAL HIGH (ref 0.3–1.2)
Total Protein: 7.2 g/dL (ref 6.5–8.1)

## 2019-04-01 MED ORDER — EPOETIN ALFA-EPBX 40000 UNIT/ML IJ SOLN
40000.0000 [IU] | Freq: Once | INTRAMUSCULAR | Status: AC
  Administered 2019-04-01: 40000 [IU] via SUBCUTANEOUS

## 2019-04-01 MED ORDER — EPOETIN ALFA-EPBX 40000 UNIT/ML IJ SOLN
INTRAMUSCULAR | Status: AC
  Filled 2019-04-01: qty 1

## 2019-04-01 NOTE — Patient Instructions (Signed)

## 2019-04-06 IMAGING — MR MR HEAD W/O CM
10 series · 48 of 48 positions shown · non-contrast
Comparison: None.

CLINICAL DATA: Confusion. Difficulty with finding words and
concentration over the last 1 year.

EXAM:
MRI HEAD WITHOUT CONTRAST
TECHNIQUE: Multiplanar, multiecho pulse sequences of the brain and surrounding
structures were obtained without intravenous contrast.

[Series 2: t1_se_sag · sagittal · 5.0mm · 0.45mm/px · 3 of 23 slices shown]
[im 1/23]
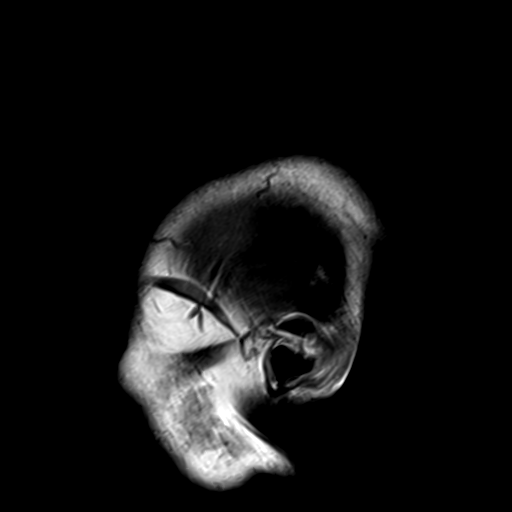
[im 12/23]
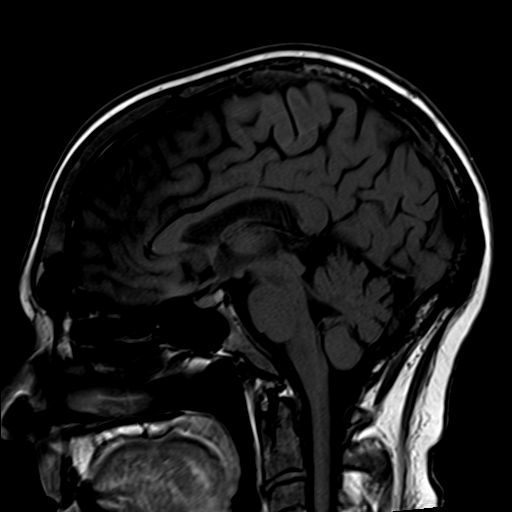
[im 23/23]
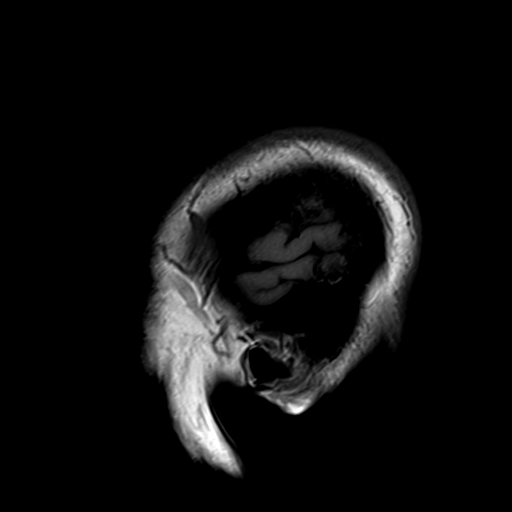

[Series 3: ep2d_diff_(id)_trace · axial · 3.0mm · 1.80mm/px · z∈[-72,+77]mm · 8 of 101 slices shown]
[im 1/101]
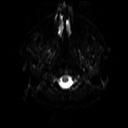
[im 15/101]
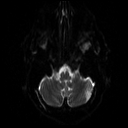
[im 29/101]
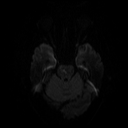
[im 43/101]
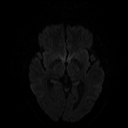
[im 58/101]
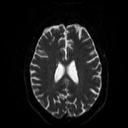
[im 72/101]
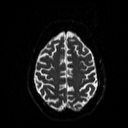
[im 86/101]
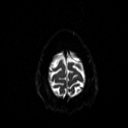
[im 101/101]
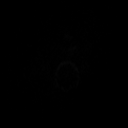

[Series 4: ep2d_diff_(id)_trace_adc · axial · 3.0mm · 1.80mm/px · z∈[-72,+77]mm · 4 of 48 slices shown]
[im 1/48]
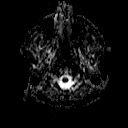
[im 16/48]
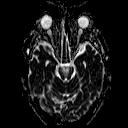
[im 32/48]
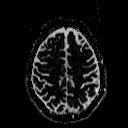
[im 48/48]
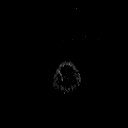

[Series 5: ep2d_diff_cor · coronal · 5.0mm · 1.77mm/px · 5 of 59 slices shown]
[im 1/59]
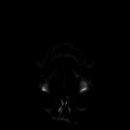
[im 15/59]
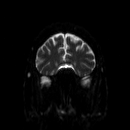
[im 30/59]
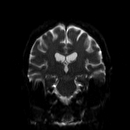
[im 44/59]
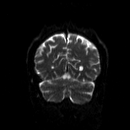
[im 59/59]
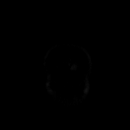

[Series 6: ep2d_diff_cor_adc · coronal · 5.0mm · 1.77mm/px · 2 of 30 slices shown]
[im 1/30]
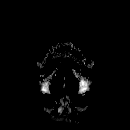
[im 30/30]
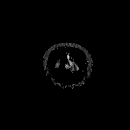

[Series 8: swi_images · axial · 2.0mm · 0.90mm/px · z∈[-76,+82]mm · 7 of 80 slices shown]
[im 1/80]
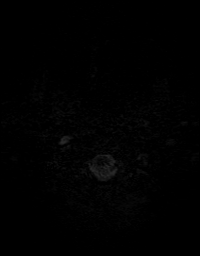
[im 14/80]
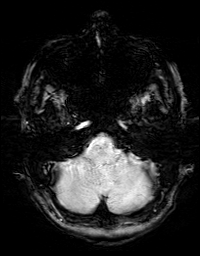
[im 27/80]
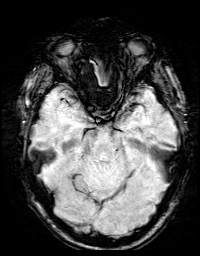
[im 40/80]
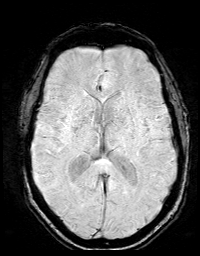
[im 53/80]
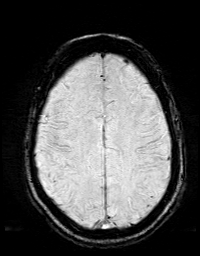
[im 66/80]
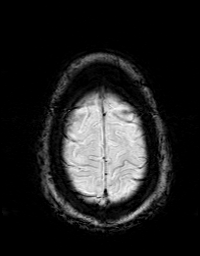
[im 80/80]
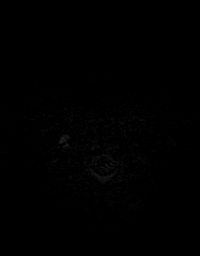

[Series 9: FLAIR · axial · 3.0mm · 0.45mm/px · z∈[-75,+81]mm · 2 of 27 slices shown]
[im 1/27]
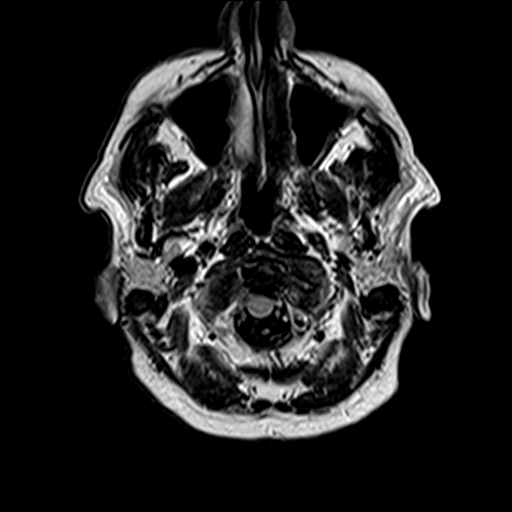
[im 27/27]
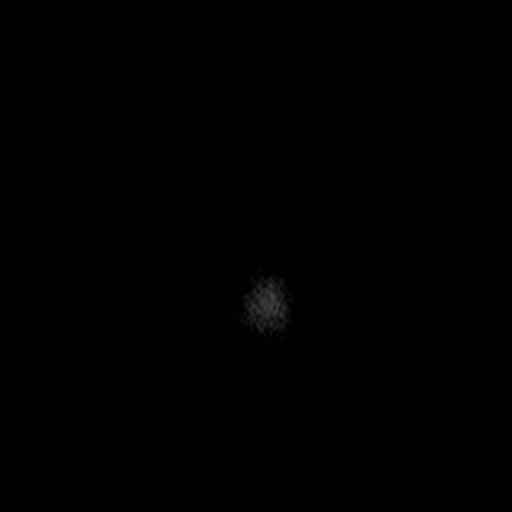

[Series 10: t2_tse_tra_512 · axial · 5.0mm · 0.62mm/px · z∈[-75,+81]mm · 2 of 27 slices shown]
[im 1/27]
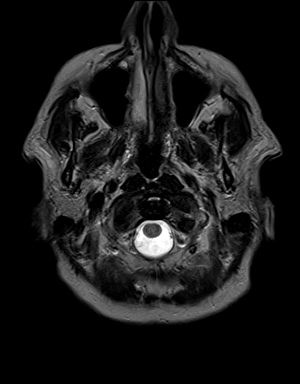
[im 27/27]
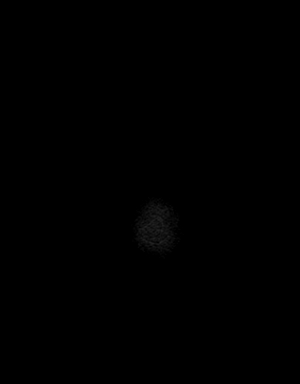

[Series 11: t1_mpr_tra · axial · 1.0mm · 0.75mm/px · z∈[-78,+80]mm · 13 of 160 slices shown]
[im 1/160]
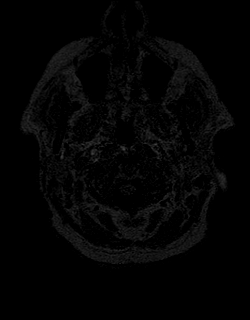
[im 14/160]
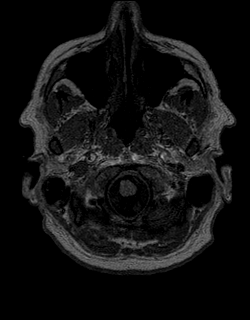
[im 27/160]
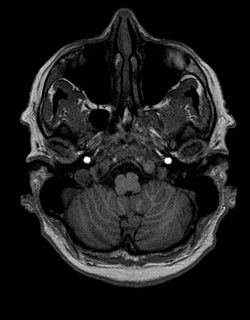
[im 40/160]
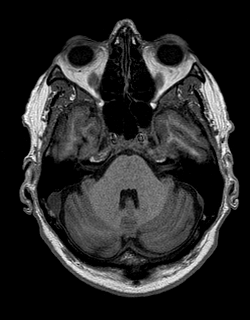
[im 54/160]
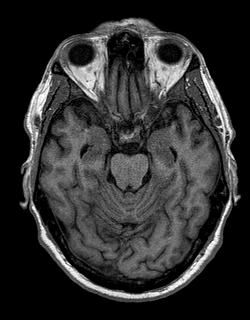
[im 67/160]
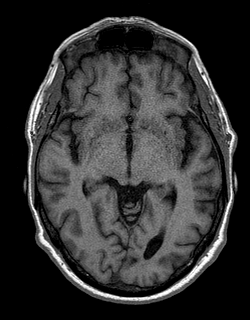
[im 80/160]
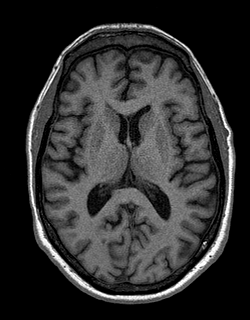
[im 93/160]
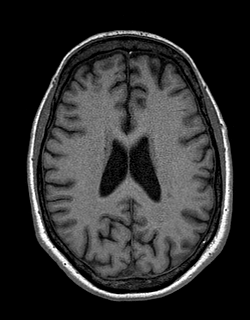
[im 107/160]
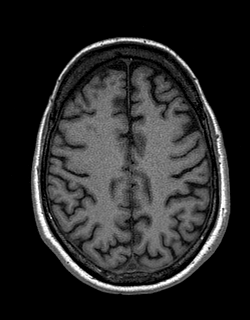
[im 120/160]
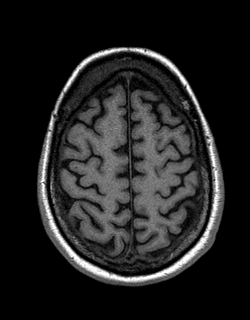
[im 133/160]
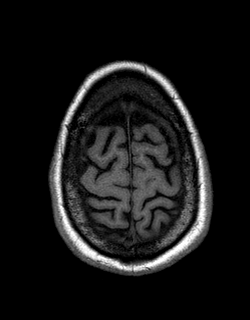
[im 146/160]
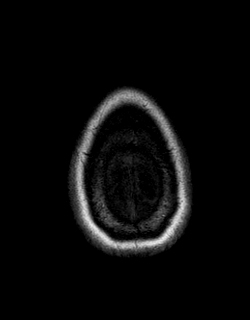
[im 160/160]
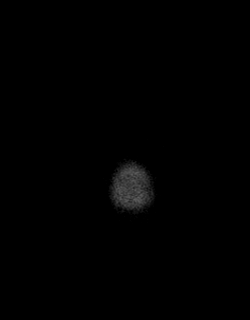

[Series 12: T2 · coronal · 5.0mm · 0.45mm/px · 2 of 30 slices shown]
[im 1/30]
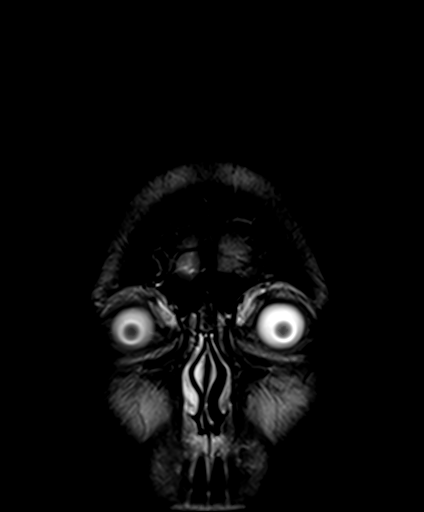
[im 30/30]
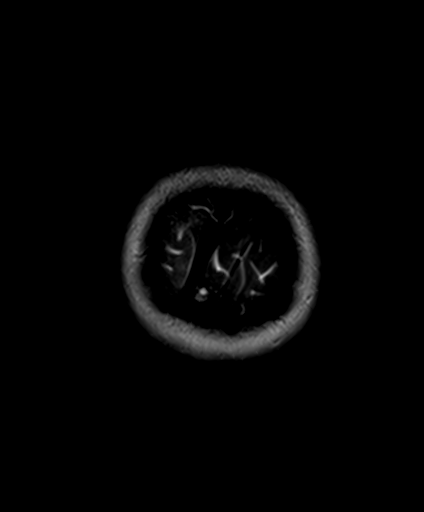

[48 of 48 positions shown; findings below may reference images not displayed]

FINDINGS: Brain: No acute infarct, hemorrhage, or mass lesion is present. The
ventricles are of normal size. No significant extraaxial fluid
collection is present. Brainstem and cerebellum are normal. The
internal auditory canals are within normal limits. There is artifact
across the brainstem on the diffusion-weighted images.

Vascular: Flow is present in the major intracranial arteries.

Skull and upper cervical spine: The skull base is within normal
limits. Craniocervical junction is normal. The upper cervical spine
is within normal limits.

Sinuses/Orbits: The paranasal sinuses and mastoid air cells are
clear. Globes and orbits are within normal limits.
IMPRESSION: Negative MRI of the brain. No acute or focal lesion to explain the
patient's confusion or word-finding difficulties.

## 2019-04-08 ENCOUNTER — Other Ambulatory Visit: Payer: Medicare Other

## 2019-04-08 ENCOUNTER — Ambulatory Visit: Payer: Medicare Other

## 2019-04-08 ENCOUNTER — Ambulatory Visit: Payer: Medicare Other | Admitting: Internal Medicine

## 2019-04-09 ENCOUNTER — Inpatient Hospital Stay: Payer: Medicare Other

## 2019-04-09 ENCOUNTER — Other Ambulatory Visit: Payer: Self-pay

## 2019-04-09 ENCOUNTER — Telehealth: Payer: Self-pay | Admitting: Internal Medicine

## 2019-04-09 ENCOUNTER — Encounter: Payer: Self-pay | Admitting: Internal Medicine

## 2019-04-09 ENCOUNTER — Inpatient Hospital Stay: Payer: Medicare Other | Admitting: Internal Medicine

## 2019-04-09 VITALS — BP 131/85 | HR 73 | Temp 98.3°F | Resp 18 | Ht 68.0 in | Wt 160.4 lb

## 2019-04-09 VITALS — BP 132/78 | HR 72 | Temp 98.2°F | Resp 18

## 2019-04-09 DIAGNOSIS — D75839 Thrombocytosis, unspecified: Secondary | ICD-10-CM

## 2019-04-09 DIAGNOSIS — D469 Myelodysplastic syndrome, unspecified: Secondary | ICD-10-CM

## 2019-04-09 DIAGNOSIS — D473 Essential (hemorrhagic) thrombocythemia: Secondary | ICD-10-CM

## 2019-04-09 DIAGNOSIS — D461 Refractory anemia with ring sideroblasts: Secondary | ICD-10-CM | POA: Diagnosis not present

## 2019-04-09 LAB — CMP (CANCER CENTER ONLY)
ALT: 37 U/L (ref 0–44)
AST: 18 U/L (ref 15–41)
Albumin: 4.1 g/dL (ref 3.5–5.0)
Alkaline Phosphatase: 52 U/L (ref 38–126)
Anion gap: 8 (ref 5–15)
BUN: 20 mg/dL (ref 8–23)
CO2: 24 mmol/L (ref 22–32)
Calcium: 8.7 mg/dL — ABNORMAL LOW (ref 8.9–10.3)
Chloride: 110 mmol/L (ref 98–111)
Creatinine: 0.89 mg/dL (ref 0.61–1.24)
GFR, Est AFR Am: >60 mL/min (ref >60)
GFR, Estimated: >60 mL/min (ref >60)
Glucose, Bld: 97 mg/dL (ref 70–99)
Potassium: 4.3 mmol/L (ref 3.5–5.1)
Sodium: 142 mmol/L (ref 135–145)
Total Bilirubin: 1.6 mg/dL — ABNORMAL HIGH (ref 0.3–1.2)
Total Protein: 6.9 g/dL (ref 6.5–8.1)

## 2019-04-09 LAB — CBC WITH DIFFERENTIAL (CANCER CENTER ONLY)
Abs Immature Granulocytes: 0.02 10*3/uL (ref 0.00–0.07)
Basophils Absolute: 0.2 10*3/uL — ABNORMAL HIGH (ref 0.0–0.1)
Basophils Relative: 3 %
Eosinophils Absolute: 0.5 10*3/uL (ref 0.0–0.5)
Eosinophils Relative: 7 %
HCT: 31.1 % — ABNORMAL LOW (ref 39.0–52.0)
Hemoglobin: 10.2 g/dL — ABNORMAL LOW (ref 13.0–17.0)
Immature Granulocytes: 0 %
Lymphocytes Relative: 24 %
Lymphs Abs: 1.6 10*3/uL (ref 0.7–4.0)
MCH: 30.1 pg (ref 26.0–34.0)
MCHC: 32.8 g/dL (ref 30.0–36.0)
MCV: 91.7 fL (ref 80.0–100.0)
Monocytes Absolute: 0.7 10*3/uL (ref 0.1–1.0)
Monocytes Relative: 11 %
Neutro Abs: 3.6 10*3/uL (ref 1.7–7.7)
Neutrophils Relative %: 55 %
Platelet Count: 374 10*3/uL (ref 150–400)
RBC: 3.39 MIL/uL — ABNORMAL LOW (ref 4.22–5.81)
RDW: 32.7 % — ABNORMAL HIGH (ref 11.5–15.5)
WBC Count: 6.7 10*3/uL (ref 4.0–10.5)
nRBC: 0 % (ref 0.0–0.2)

## 2019-04-09 LAB — VITAMIN B12: Vitamin B-12: 2367 pg/mL — ABNORMAL HIGH (ref 180–914)

## 2019-04-09 LAB — IRON AND TIBC
Iron: 182 ug/dL — ABNORMAL HIGH (ref 42–163)
Saturation Ratios: 82 % — ABNORMAL HIGH (ref 20–55)
TIBC: 221 ug/dL (ref 202–409)
UIBC: 39 ug/dL — ABNORMAL LOW (ref 117–376)

## 2019-04-09 LAB — FERRITIN: Ferritin: 1273 ng/mL — ABNORMAL HIGH (ref 24–336)

## 2019-04-09 LAB — LACTATE DEHYDROGENASE: LDH: 200 U/L — ABNORMAL HIGH (ref 98–192)

## 2019-04-09 LAB — FOLATE: Folate: 14.5 ng/mL (ref >5.9)

## 2019-04-09 MED ORDER — ANAGRELIDE HCL 1 MG PO CAPS: 1.0000 mg | ORAL_CAPSULE | Freq: Every day | ORAL | 4 refills | Status: DC

## 2019-04-09 MED ORDER — ANAGRELIDE HCL 1 MG PO CAPS: 1.0000 mg | ORAL_CAPSULE | Freq: Every day | ORAL | 1 refills | Status: DC

## 2019-04-09 MED ORDER — EPOETIN ALFA-EPBX 40000 UNIT/ML IJ SOLN
40000.0000 [IU] | Freq: Once | INTRAMUSCULAR | Status: AC
  Administered 2019-04-09: 40000 [IU] via SUBCUTANEOUS

## 2019-04-09 MED ORDER — ANAGRELIDE HCL 1 MG PO CAPS: 1.0000 mg | ORAL_CAPSULE | Freq: Two times a day (BID) | ORAL | 1 refills | Status: DC

## 2019-04-09 MED ORDER — ANAGRELIDE HCL 1 MG PO CAPS: 1.0000 mg | ORAL_CAPSULE | Freq: Every day | ORAL | 0 refills | Status: DC

## 2019-04-09 MED ORDER — ANAGRELIDE HCL 0.5 MG PO CAPS: 1.0000 mg | ORAL_CAPSULE | Freq: Every day | ORAL | 3 refills | Status: DC

## 2019-04-09 NOTE — Telephone Encounter (Signed)
Scheduled per 12/29 los, patient will be notified by My chart.

## 2019-04-09 NOTE — Progress Notes (Signed)
[place]  Telephone:(336) [phone] Fax:(336) [phone]  OFFICE PROGRESS NOTE  [name], MD [address] [place] [address]  DIAGNOSIS: 1.  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 Essential thrombocythemia with positive JAK2 mutation 3. Leukocytosis.   PRIOR THERAPY: Previous treatment with combination of Hydrea and anagrelide.  CURRENT THERAPY:  1) Revlimid 5 mg by mouth daily.  Started 03/25/2016.  Status post 24 cycles. 2) anagrelide 1 mg by mouth every other day. 3) Aranesp 300 mcg subcutaneously every 3 weeks.  This is switched to Procrit 400 mcg subcutaneously every 2 weeks starting April 13, 2017.  The frequency of the Procrit was changed to weekly schedule when he was in [place].  He is currently on Retacrit weekly as a biosimilar to Procrit.  INTERVAL HISTORY: Roy Johnson 73 y.o. male returns to the clinic today for follow-up visit.  The patient is feeling fine today with no concerning complaints except for fatigue.  He denied having any recent bleeding, bruises or ecchymosis.  He continues to tolerate his treatment with Revlimid as well as anagrelide and Retacrit weekly injection fairly well.  He denied having any current chest pain, shortness of breath, cough or hemoptysis.  He denied having any fever or chills.  He has no nausea, vomiting, diarrhea or constipation.  He has no headache or visual changes.  He is planning to move back to [place] in the middle of January 2021.  The patient is here today for evaluation and repeat blood work.   MEDICAL HISTORY: Past Medical History:  Diagnosis Date  . Anemia   . Colon polyp   . Diverticulitis   . Diverticulosis   . GERD (gastroesophageal reflux disease)   . Hyperlipidemia   . IBS (irritable bowel syndrome)   . Inguinal hernia   . 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  . Prostate cancer ([place])                . Prostate cancer  ([place])   . Umbilical hernia   . Vitamin D deficiency     ALLERGIES:  has No Known Allergies.  MEDICATIONS:  Current Outpatient Medications  Medication Sig Dispense Refill  . amLODipine (NORVASC) 5 MG tablet Take 5 mg by mouth daily.  2  . amphetamine-dextroamphetamine (ADDERALL) 10 MG tablet Take by mouth.    . anagrelide (AGRYLIN) 0.5 MG capsule Take 1 capsule (0.5 mg total) by mouth 2 (two) times daily. TAKE 1 CAPSULE(1 MG) BY MOUTH DAILY 180 capsule 5  . aspirin EC 81 MG tablet Take 81 mg by mouth.    . cetirizine (ZYRTEC) 5 MG tablet Take by mouth.    Marland Kitchen epoetin alfa-epbx (RETACRIT) 43329 UNIT/ML injection Inject into the skin.    Marland Kitchen gabapentin (NEURONTIN) 100 MG capsule TAKE 1 CAPSULE BY MOUTH TWICE DAILY AND 2 AT NIGHT 120 capsule 3  . hyoscyamine (LEVSIN SL) 0.125 MG SL tablet Place 1 tablet (0.125 mg total) under the tongue every 4 (four) hours as needed. 90 tablet 3  . lenalidomide (REVLIMID) 5 MG capsule Take 5 mg by mouth daily.    . Multiple Vitamin (MULTI-VITAMINS) TABS Take 1 tablet by mouth daily.    . Polyethylene Glycol 3350 (PEG 3350) POWD nightly.    . vitamin B-12 (CYANOCOBALAMIN) 1000 MCG tablet Take 1 tablet by mouth daily as needed.     No current facility-administered medications for this visit.   Facility-Administered Medications Ordered in Other Visits  Medication  Dose Route Frequency Provider Last Rate Last Admin  . 0.9 %  sodium chloride infusion  250 mL Intravenous Once Curt Bears, MD        SURGICAL HISTORY:  Past Surgical History:  Procedure Laterality Date  . COLONOSCOPY    . POLYPECTOMY    . PROSTATECTOMY    . TONSILLECTOMY      REVIEW OF SYSTEMS:  A comprehensive review of systems was negative except for: Constitutional: positive for fatigue Musculoskeletal: positive for arthralgias   PHYSICAL EXAMINATION: General appearance: alert, cooperative, appears stated age, fatigued and no distress Head: Normocephalic, without obvious  abnormality, atraumatic Neck: no adenopathy, no JVD, supple, symmetrical, trachea midline and thyroid not enlarged, symmetric, no tenderness/mass/nodules Lymph nodes: Cervical, supraclavicular, and axillary nodes normal. Resp: clear to auscultation bilaterally Back: symmetric, no curvature. ROM normal. No CVA tenderness. Cardio: regular rate and rhythm, S1, S2 normal, no murmur, click, rub or gallop GI: abnormal findings:  distended Extremities: extremities normal, atraumatic, no cyanosis or edema  ECOG PERFORMANCE STATUS: 1 - Symptomatic but completely ambulatory  Blood pressure 131/85, pulse 73, temperature 98.3 F (36.8 C), temperature source Oral, resp. rate 18, height 5\' 8"  (1.727 m), weight 160 lb 6.4 oz (72.8 kg), SpO2 99 %.  LABORATORY DATA:  Lab Results  Component Value Date   WBC 6.7 04/09/2019   HGB 10.2 (L) 04/09/2019   HCT 31.1 (L) 04/09/2019   MCV 91.7 04/09/2019   PLT 374 04/09/2019      Chemistry      Component Value Date/Time   NA 142 04/09/2019 0945   NA 141 12/08/2016 0928   K 4.3 04/09/2019 0945   K 4.5 12/08/2016 0928   CL 110 04/09/2019 0945   CO2 24 04/09/2019 0945   CO2 27 12/08/2016 0928   BUN 20 04/09/2019 0945   BUN 14.1 12/08/2016 0928   CREATININE 0.89 04/09/2019 0945   CREATININE 0.9 12/08/2016 0928      Component Value Date/Time   CALCIUM 8.7 (L) 04/09/2019 0945   CALCIUM 9.0 12/08/2016 0928   ALKPHOS 52 04/09/2019 0945   ALKPHOS 87 12/08/2016 0928   AST 18 04/09/2019 0945   AST 25 12/08/2016 0928   ALT 37 04/09/2019 0945   ALT 41 12/08/2016 0928   BILITOT 1.6 (H) 04/09/2019 0945   BILITOT 0.84 12/08/2016 0928      RADIOGRAPHIC STUDIES:  ASSESSMENT AND PLAN:  This is a very pleasant 73 years old white male with essential thrombocythemia with positive JAK-2 mutation as well as myelodysplastic syndrome. He is currently on treatment with Revlimid 5 mg by mouth daily according to the recommendation from [facility].  He is also  on treatment with Aranesp every 3 weeks.  Aranesp was a change it to Procrit initially every 2 weeks and currently on weekly basis.  He mentioned that he is feeling much better on Procrit weekly  He has been on the weekly treatment with Procrit for the last 15 months.   The patient is doing fine today with no concerning complaints except for fatigue.  Repeat CBC showed mild anemia but his white blood count and platelets count are within the normal range. He has iron study performed recently that showed no evidence for iron deficiency. I recommended for the patient to continue his current treatment with Retacrit weekly as well as anagrelide 1 mg p.o. daily. He will come back for follow-up visit after he returns back from [place] in May 2021. He was advised to call  immediately if he has any concerning symptoms in the interval. All questions were answered. The patient knows to call the clinic with any problems, questions or concerns. We can certainly see the patient much sooner if necessary.   Disclaimer: This note was dictated with voice recognition software. Similar sounding words can inadvertently be transcribed and may not be corrected upon review.

## 2019-04-09 NOTE — Patient Instructions (Signed)

## 2019-04-15 ENCOUNTER — Inpatient Hospital Stay: Payer: Medicare Other

## 2019-04-15 ENCOUNTER — Other Ambulatory Visit: Payer: Self-pay

## 2019-04-15 ENCOUNTER — Inpatient Hospital Stay: Payer: Medicare Other | Attending: Internal Medicine

## 2019-04-15 VITALS — BP 148/76 | HR 77 | Temp 98.2°F | Resp 18

## 2019-04-15 DIAGNOSIS — Z8719 Personal history of other diseases of the digestive system: Secondary | ICD-10-CM | POA: Diagnosis not present

## 2019-04-15 DIAGNOSIS — Z79899 Other long term (current) drug therapy: Secondary | ICD-10-CM | POA: Diagnosis not present

## 2019-04-15 DIAGNOSIS — Z86010 Personal history of colonic polyps: Secondary | ICD-10-CM | POA: Diagnosis not present

## 2019-04-15 DIAGNOSIS — D469 Myelodysplastic syndrome, unspecified: Secondary | ICD-10-CM

## 2019-04-15 DIAGNOSIS — D72829 Elevated white blood cell count, unspecified: Secondary | ICD-10-CM | POA: Insufficient documentation

## 2019-04-15 DIAGNOSIS — D473 Essential (hemorrhagic) thrombocythemia: Secondary | ICD-10-CM | POA: Diagnosis not present

## 2019-04-15 DIAGNOSIS — Z8546 Personal history of malignant neoplasm of prostate: Secondary | ICD-10-CM | POA: Insufficient documentation

## 2019-04-15 DIAGNOSIS — D461 Refractory anemia with ring sideroblasts: Secondary | ICD-10-CM | POA: Insufficient documentation

## 2019-04-15 LAB — CMP (CANCER CENTER ONLY)
ALT: 29 U/L (ref 0–44)
AST: 21 U/L (ref 15–41)
Albumin: 4 g/dL (ref 3.5–5.0)
Alkaline Phosphatase: 59 U/L (ref 38–126)
Anion gap: 7 (ref 5–15)
BUN: 17 mg/dL (ref 8–23)
CO2: 24 mmol/L (ref 22–32)
Calcium: 8.6 mg/dL — ABNORMAL LOW (ref 8.9–10.3)
Chloride: 111 mmol/L (ref 98–111)
Creatinine: 0.89 mg/dL (ref 0.61–1.24)
GFR, Est AFR Am: >60 mL/min (ref >60)
GFR, Estimated: >60 mL/min (ref >60)
Glucose, Bld: 95 mg/dL (ref 70–99)
Potassium: 4.6 mmol/L (ref 3.5–5.1)
Sodium: 142 mmol/L (ref 135–145)
Total Bilirubin: 1 mg/dL (ref 0.3–1.2)
Total Protein: 6.7 g/dL (ref 6.5–8.1)

## 2019-04-15 LAB — CBC WITH DIFFERENTIAL (CANCER CENTER ONLY)
Abs Immature Granulocytes: 0.15 10*3/uL — ABNORMAL HIGH (ref 0.00–0.07)
Basophils Absolute: 0.2 10*3/uL — ABNORMAL HIGH (ref 0.0–0.1)
Basophils Relative: 2 %
Eosinophils Absolute: 0.7 10*3/uL — ABNORMAL HIGH (ref 0.0–0.5)
Eosinophils Relative: 9 %
HCT: 29.7 % — ABNORMAL LOW (ref 39.0–52.0)
Hemoglobin: 9.5 g/dL — ABNORMAL LOW (ref 13.0–17.0)
Immature Granulocytes: 2 %
Lymphocytes Relative: 22 %
Lymphs Abs: 1.8 10*3/uL (ref 0.7–4.0)
MCH: 29.6 pg (ref 26.0–34.0)
MCHC: 32 g/dL (ref 30.0–36.0)
MCV: 92.5 fL (ref 80.0–100.0)
Monocytes Absolute: 0.5 10*3/uL (ref 0.1–1.0)
Monocytes Relative: 7 %
Neutro Abs: 4.7 10*3/uL (ref 1.7–7.7)
Neutrophils Relative %: 58 %
Platelet Count: 483 10*3/uL — ABNORMAL HIGH (ref 150–400)
RBC: 3.21 MIL/uL — ABNORMAL LOW (ref 4.22–5.81)
RDW: 33.4 % — ABNORMAL HIGH (ref 11.5–15.5)
WBC Count: 8 10*3/uL (ref 4.0–10.5)
nRBC: 0.4 % — ABNORMAL HIGH (ref 0.0–0.2)

## 2019-04-15 MED ORDER — EPOETIN ALFA-EPBX 40000 UNIT/ML IJ SOLN
40000.0000 [IU] | Freq: Once | INTRAMUSCULAR | Status: AC
  Administered 2019-04-15: 40000 [IU] via SUBCUTANEOUS

## 2019-04-15 MED ORDER — EPOETIN ALFA-EPBX 40000 UNIT/ML IJ SOLN
INTRAMUSCULAR | Status: AC
  Filled 2019-04-15: qty 1

## 2019-04-15 NOTE — Patient Instructions (Signed)

## 2019-04-22 ENCOUNTER — Inpatient Hospital Stay: Payer: Medicare Other

## 2019-04-22 ENCOUNTER — Other Ambulatory Visit: Payer: Self-pay

## 2019-04-22 VITALS — BP 135/81 | HR 77 | Resp 18

## 2019-04-22 DIAGNOSIS — D469 Myelodysplastic syndrome, unspecified: Secondary | ICD-10-CM

## 2019-04-22 DIAGNOSIS — D461 Refractory anemia with ring sideroblasts: Secondary | ICD-10-CM | POA: Diagnosis not present

## 2019-04-22 LAB — CMP (CANCER CENTER ONLY)
ALT: 31 U/L (ref 0–44)
AST: 16 U/L (ref 15–41)
Albumin: 4.3 g/dL (ref 3.5–5.0)
Alkaline Phosphatase: 65 U/L (ref 38–126)
Anion gap: 8 (ref 5–15)
BUN: 16 mg/dL (ref 8–23)
CO2: 23 mmol/L (ref 22–32)
Calcium: 8.9 mg/dL (ref 8.9–10.3)
Chloride: 110 mmol/L (ref 98–111)
Creatinine: 1.04 mg/dL (ref 0.61–1.24)
GFR, Est AFR Am: >60 mL/min (ref >60)
GFR, Estimated: >60 mL/min (ref >60)
Glucose, Bld: 109 mg/dL — ABNORMAL HIGH (ref 70–99)
Potassium: 4.7 mmol/L (ref 3.5–5.1)
Sodium: 141 mmol/L (ref 135–145)
Total Bilirubin: 0.8 mg/dL (ref 0.3–1.2)
Total Protein: 7.1 g/dL (ref 6.5–8.1)

## 2019-04-22 LAB — CBC WITH DIFFERENTIAL (CANCER CENTER ONLY)
Abs Immature Granulocytes: 0.14 10*3/uL — ABNORMAL HIGH (ref 0.00–0.07)
Basophils Absolute: 0.2 10*3/uL — ABNORMAL HIGH (ref 0.0–0.1)
Basophils Relative: 2 %
Eosinophils Absolute: 1.3 10*3/uL — ABNORMAL HIGH (ref 0.0–0.5)
Eosinophils Relative: 11 %
HCT: 33.4 % — ABNORMAL LOW (ref 39.0–52.0)
Hemoglobin: 10.6 g/dL — ABNORMAL LOW (ref 13.0–17.0)
Immature Granulocytes: 1 %
Lymphocytes Relative: 19 %
Lymphs Abs: 2.2 10*3/uL (ref 0.7–4.0)
MCH: 29.3 pg (ref 26.0–34.0)
MCHC: 31.7 g/dL (ref 30.0–36.0)
MCV: 92.3 fL (ref 80.0–100.0)
Monocytes Absolute: 1.2 10*3/uL — ABNORMAL HIGH (ref 0.1–1.0)
Monocytes Relative: 10 %
Neutro Abs: 6.8 10*3/uL (ref 1.7–7.7)
Neutrophils Relative %: 57 %
Platelet Count: 312 10*3/uL (ref 150–400)
RBC: 3.62 MIL/uL — ABNORMAL LOW (ref 4.22–5.81)
RDW: 33.8 % — ABNORMAL HIGH (ref 11.5–15.5)
WBC Count: 11.9 10*3/uL — ABNORMAL HIGH (ref 4.0–10.5)
nRBC: 0.3 % — ABNORMAL HIGH (ref 0.0–0.2)

## 2019-04-22 MED ORDER — EPOETIN ALFA-EPBX 40000 UNIT/ML IJ SOLN
INTRAMUSCULAR | Status: AC
  Filled 2019-04-22: qty 1

## 2019-04-22 MED ORDER — EPOETIN ALFA-EPBX 40000 UNIT/ML IJ SOLN
40000.0000 [IU] | Freq: Once | INTRAMUSCULAR | Status: AC
  Administered 2019-04-22: 11:00:00 40000 [IU] via SUBCUTANEOUS

## 2019-04-29 ENCOUNTER — Inpatient Hospital Stay: Payer: Medicare Other

## 2019-04-29 ENCOUNTER — Other Ambulatory Visit: Payer: Self-pay

## 2019-04-29 DIAGNOSIS — D461 Refractory anemia with ring sideroblasts: Secondary | ICD-10-CM | POA: Diagnosis not present

## 2019-04-29 DIAGNOSIS — D469 Myelodysplastic syndrome, unspecified: Secondary | ICD-10-CM

## 2019-04-29 LAB — CMP (CANCER CENTER ONLY)
ALT: 45 U/L — ABNORMAL HIGH (ref 0–44)
AST: 21 U/L (ref 15–41)
Albumin: 4.1 g/dL (ref 3.5–5.0)
Alkaline Phosphatase: 56 U/L (ref 38–126)
Anion gap: 7 (ref 5–15)
BUN: 21 mg/dL (ref 8–23)
CO2: 25 mmol/L (ref 22–32)
Calcium: 8.4 mg/dL — ABNORMAL LOW (ref 8.9–10.3)
Chloride: 109 mmol/L (ref 98–111)
Creatinine: 1.14 mg/dL (ref 0.61–1.24)
GFR, Est AFR Am: >60 mL/min (ref >60)
GFR, Estimated: >60 mL/min (ref >60)
Glucose, Bld: 96 mg/dL (ref 70–99)
Potassium: 4.9 mmol/L (ref 3.5–5.1)
Sodium: 141 mmol/L (ref 135–145)
Total Bilirubin: 1.5 mg/dL — ABNORMAL HIGH (ref 0.3–1.2)
Total Protein: 7 g/dL (ref 6.5–8.1)

## 2019-04-29 LAB — CBC WITH DIFFERENTIAL (CANCER CENTER ONLY)
Abs Immature Granulocytes: 0.03 10*3/uL (ref 0.00–0.07)
Basophils Absolute: 0.2 10*3/uL — ABNORMAL HIGH (ref 0.0–0.1)
Basophils Relative: 3 %
Eosinophils Absolute: 1.1 10*3/uL — ABNORMAL HIGH (ref 0.0–0.5)
Eosinophils Relative: 14 %
HCT: 34.6 % — ABNORMAL LOW (ref 39.0–52.0)
Hemoglobin: 11.1 g/dL — ABNORMAL LOW (ref 13.0–17.0)
Immature Granulocytes: 0 %
Lymphocytes Relative: 23 %
Lymphs Abs: 1.8 10*3/uL (ref 0.7–4.0)
MCH: 30 pg (ref 26.0–34.0)
MCHC: 32.1 g/dL (ref 30.0–36.0)
MCV: 93.5 fL (ref 80.0–100.0)
Monocytes Absolute: 0.7 10*3/uL (ref 0.1–1.0)
Monocytes Relative: 9 %
Neutro Abs: 3.9 10*3/uL (ref 1.7–7.7)
Neutrophils Relative %: 51 %
Platelet Count: 234 10*3/uL (ref 150–400)
RBC: 3.7 MIL/uL — ABNORMAL LOW (ref 4.22–5.81)
RDW: 33.8 % — ABNORMAL HIGH (ref 11.5–15.5)
WBC Count: 7.7 10*3/uL (ref 4.0–10.5)
nRBC: 0 % (ref 0.0–0.2)

## 2019-04-29 NOTE — Progress Notes (Signed)
Hgb is 11.1 today.  Injection held.  Copies of labs given to patient.

## 2019-05-01 ENCOUNTER — Other Ambulatory Visit: Payer: Self-pay | Admitting: Internal Medicine

## 2019-05-01 DIAGNOSIS — D469 Myelodysplastic syndrome, unspecified: Secondary | ICD-10-CM

## 2019-05-06 ENCOUNTER — Other Ambulatory Visit: Payer: Self-pay

## 2019-05-06 ENCOUNTER — Inpatient Hospital Stay: Payer: Medicare Other

## 2019-05-06 VITALS — BP 134/78 | HR 72 | Temp 98.2°F | Resp 18

## 2019-05-06 DIAGNOSIS — D461 Refractory anemia with ring sideroblasts: Secondary | ICD-10-CM | POA: Diagnosis not present

## 2019-05-06 DIAGNOSIS — D469 Myelodysplastic syndrome, unspecified: Secondary | ICD-10-CM

## 2019-05-06 LAB — CMP (CANCER CENTER ONLY)
ALT: 32 U/L (ref 0–44)
AST: 16 U/L (ref 15–41)
Albumin: 4.2 g/dL (ref 3.5–5.0)
Alkaline Phosphatase: 58 U/L (ref 38–126)
Anion gap: 6 (ref 5–15)
BUN: 22 mg/dL (ref 8–23)
CO2: 26 mmol/L (ref 22–32)
Calcium: 9 mg/dL (ref 8.9–10.3)
Chloride: 111 mmol/L (ref 98–111)
Creatinine: 1.05 mg/dL (ref 0.61–1.24)
GFR, Est AFR Am: >60 mL/min (ref >60)
GFR, Estimated: >60 mL/min (ref >60)
Glucose, Bld: 100 mg/dL — ABNORMAL HIGH (ref 70–99)
Potassium: 4.3 mmol/L (ref 3.5–5.1)
Sodium: 143 mmol/L (ref 135–145)
Total Bilirubin: 0.8 mg/dL (ref 0.3–1.2)
Total Protein: 6.9 g/dL (ref 6.5–8.1)

## 2019-05-06 LAB — CBC WITH DIFFERENTIAL (CANCER CENTER ONLY)
Abs Immature Granulocytes: 0.01 10*3/uL (ref 0.00–0.07)
Basophils Absolute: 0.3 10*3/uL — ABNORMAL HIGH (ref 0.0–0.1)
Basophils Relative: 5 %
Eosinophils Absolute: 0.3 10*3/uL (ref 0.0–0.5)
Eosinophils Relative: 5 %
HCT: 30.9 % — ABNORMAL LOW (ref 39.0–52.0)
Hemoglobin: 10.1 g/dL — ABNORMAL LOW (ref 13.0–17.0)
Immature Granulocytes: 0 %
Lymphocytes Relative: 26 %
Lymphs Abs: 1.6 10*3/uL (ref 0.7–4.0)
MCH: 30.8 pg (ref 26.0–34.0)
MCHC: 32.7 g/dL (ref 30.0–36.0)
MCV: 94.2 fL (ref 80.0–100.0)
Monocytes Absolute: 0.6 10*3/uL (ref 0.1–1.0)
Monocytes Relative: 9 %
Neutro Abs: 3.3 10*3/uL (ref 1.7–7.7)
Neutrophils Relative %: 55 %
Platelet Count: 176 10*3/uL (ref 150–400)
RBC: 3.28 MIL/uL — ABNORMAL LOW (ref 4.22–5.81)
RDW: 31.6 % — ABNORMAL HIGH (ref 11.5–15.5)
WBC Count: 6 10*3/uL (ref 4.0–10.5)
nRBC: 0 % (ref 0.0–0.2)

## 2019-05-06 MED ORDER — EPOETIN ALFA-EPBX 40000 UNIT/ML IJ SOLN
INTRAMUSCULAR | Status: AC
  Filled 2019-05-06: qty 1

## 2019-05-06 MED ORDER — EPOETIN ALFA-EPBX 40000 UNIT/ML IJ SOLN
40000.0000 [IU] | Freq: Once | INTRAMUSCULAR | Status: AC
  Administered 2019-05-06: 40000 [IU] via SUBCUTANEOUS

## 2019-05-06 NOTE — Patient Instructions (Signed)

## 2019-05-13 ENCOUNTER — Other Ambulatory Visit: Payer: Self-pay

## 2019-05-13 ENCOUNTER — Inpatient Hospital Stay: Payer: Medicare Other

## 2019-05-13 ENCOUNTER — Inpatient Hospital Stay: Payer: Medicare Other | Attending: Internal Medicine

## 2019-05-13 VITALS — BP 137/80 | HR 71 | Temp 98.3°F | Resp 18

## 2019-05-13 DIAGNOSIS — D461 Refractory anemia with ring sideroblasts: Secondary | ICD-10-CM | POA: Insufficient documentation

## 2019-05-13 DIAGNOSIS — D72829 Elevated white blood cell count, unspecified: Secondary | ICD-10-CM | POA: Diagnosis not present

## 2019-05-13 DIAGNOSIS — D469 Myelodysplastic syndrome, unspecified: Secondary | ICD-10-CM

## 2019-05-13 DIAGNOSIS — Z79899 Other long term (current) drug therapy: Secondary | ICD-10-CM | POA: Insufficient documentation

## 2019-05-13 DIAGNOSIS — D473 Essential (hemorrhagic) thrombocythemia: Secondary | ICD-10-CM | POA: Diagnosis not present

## 2019-05-13 DIAGNOSIS — T451X5A Adverse effect of antineoplastic and immunosuppressive drugs, initial encounter: Secondary | ICD-10-CM | POA: Diagnosis not present

## 2019-05-13 LAB — CMP (CANCER CENTER ONLY)
ALT: 27 U/L (ref 0–44)
AST: 18 U/L (ref 15–41)
Albumin: 4.2 g/dL (ref 3.5–5.0)
Alkaline Phosphatase: 59 U/L (ref 38–126)
Anion gap: 8 (ref 5–15)
BUN: 22 mg/dL (ref 8–23)
CO2: 24 mmol/L (ref 22–32)
Calcium: 8.9 mg/dL (ref 8.9–10.3)
Chloride: 111 mmol/L (ref 98–111)
Creatinine: 1.08 mg/dL (ref 0.61–1.24)
GFR, Est AFR Am: >60 mL/min (ref >60)
GFR, Estimated: >60 mL/min (ref >60)
Glucose, Bld: 87 mg/dL (ref 70–99)
Potassium: 4.6 mmol/L (ref 3.5–5.1)
Sodium: 143 mmol/L (ref 135–145)
Total Bilirubin: 0.9 mg/dL (ref 0.3–1.2)
Total Protein: 7.1 g/dL (ref 6.5–8.1)

## 2019-05-13 LAB — CBC WITH DIFFERENTIAL (CANCER CENTER ONLY)
Abs Immature Granulocytes: 0.32 10*3/uL — ABNORMAL HIGH (ref 0.00–0.07)
Basophils Absolute: 0.2 10*3/uL — ABNORMAL HIGH (ref 0.0–0.1)
Basophils Relative: 2 %
Eosinophils Absolute: 0.3 10*3/uL (ref 0.0–0.5)
Eosinophils Relative: 3 %
HCT: 31.9 % — ABNORMAL LOW (ref 39.0–52.0)
Hemoglobin: 10.2 g/dL — ABNORMAL LOW (ref 13.0–17.0)
Immature Granulocytes: 3 %
Lymphocytes Relative: 21 %
Lymphs Abs: 2.1 10*3/uL (ref 0.7–4.0)
MCH: 30.5 pg (ref 26.0–34.0)
MCHC: 32 g/dL (ref 30.0–36.0)
MCV: 95.5 fL (ref 80.0–100.0)
Monocytes Absolute: 0.7 10*3/uL (ref 0.1–1.0)
Monocytes Relative: 7 %
Neutro Abs: 6.2 10*3/uL (ref 1.7–7.7)
Neutrophils Relative %: 64 %
Platelet Count: 483 10*3/uL — ABNORMAL HIGH (ref 150–400)
RBC: 3.34 MIL/uL — ABNORMAL LOW (ref 4.22–5.81)
RDW: 33.3 % — ABNORMAL HIGH (ref 11.5–15.5)
WBC Count: 9.7 10*3/uL (ref 4.0–10.5)
nRBC: 0.5 % — ABNORMAL HIGH (ref 0.0–0.2)

## 2019-05-13 MED ORDER — EPOETIN ALFA-EPBX 40000 UNIT/ML IJ SOLN
INTRAMUSCULAR | Status: AC
  Filled 2019-05-13: qty 1

## 2019-05-13 MED ORDER — EPOETIN ALFA-EPBX 40000 UNIT/ML IJ SOLN
40000.0000 [IU] | Freq: Once | INTRAMUSCULAR | Status: AC
  Administered 2019-05-13: 40000 [IU] via SUBCUTANEOUS

## 2019-05-13 NOTE — Patient Instructions (Signed)

## 2019-05-20 ENCOUNTER — Inpatient Hospital Stay: Payer: Medicare Other

## 2019-05-20 ENCOUNTER — Other Ambulatory Visit: Payer: Self-pay

## 2019-05-20 ENCOUNTER — Other Ambulatory Visit: Payer: Self-pay | Admitting: Internal Medicine

## 2019-05-20 VITALS — BP 128/78 | HR 72 | Temp 98.2°F | Resp 18

## 2019-05-20 DIAGNOSIS — D469 Myelodysplastic syndrome, unspecified: Secondary | ICD-10-CM

## 2019-05-20 DIAGNOSIS — D461 Refractory anemia with ring sideroblasts: Secondary | ICD-10-CM | POA: Diagnosis not present

## 2019-05-20 LAB — CBC WITH DIFFERENTIAL (CANCER CENTER ONLY)
Abs Immature Granulocytes: 0.25 10*3/uL — ABNORMAL HIGH (ref 0.00–0.07)
Basophils Absolute: 0.1 10*3/uL (ref 0.0–0.1)
Basophils Relative: 1 %
Eosinophils Absolute: 0.8 10*3/uL — ABNORMAL HIGH (ref 0.0–0.5)
Eosinophils Relative: 9 %
HCT: 33 % — ABNORMAL LOW (ref 39.0–52.0)
Hemoglobin: 10.6 g/dL — ABNORMAL LOW (ref 13.0–17.0)
Immature Granulocytes: 3 %
Lymphocytes Relative: 17 %
Lymphs Abs: 1.6 10*3/uL (ref 0.7–4.0)
MCH: 30.3 pg (ref 26.0–34.0)
MCHC: 32.1 g/dL (ref 30.0–36.0)
MCV: 94.3 fL (ref 80.0–100.0)
Monocytes Absolute: 0.5 10*3/uL (ref 0.1–1.0)
Monocytes Relative: 5 %
Neutro Abs: 6 10*3/uL (ref 1.7–7.7)
Neutrophils Relative %: 65 %
Platelet Count: 452 10*3/uL — ABNORMAL HIGH (ref 150–400)
RBC: 3.5 MIL/uL — ABNORMAL LOW (ref 4.22–5.81)
RDW: 33 % — ABNORMAL HIGH (ref 11.5–15.5)
WBC Count: 9.2 10*3/uL (ref 4.0–10.5)
nRBC: 0.3 % — ABNORMAL HIGH (ref 0.0–0.2)

## 2019-05-20 LAB — CMP (CANCER CENTER ONLY)
ALT: 27 U/L (ref 0–44)
AST: 13 U/L — ABNORMAL LOW (ref 15–41)
Albumin: 4 g/dL (ref 3.5–5.0)
Alkaline Phosphatase: 66 U/L (ref 38–126)
Anion gap: 5 (ref 5–15)
BUN: 16 mg/dL (ref 8–23)
CO2: 26 mmol/L (ref 22–32)
Calcium: 9 mg/dL (ref 8.9–10.3)
Chloride: 110 mmol/L (ref 98–111)
Creatinine: 0.91 mg/dL (ref 0.61–1.24)
GFR, Est AFR Am: >60 mL/min (ref >60)
GFR, Estimated: >60 mL/min (ref >60)
Glucose, Bld: 109 mg/dL — ABNORMAL HIGH (ref 70–99)
Potassium: 4.3 mmol/L (ref 3.5–5.1)
Sodium: 141 mmol/L (ref 135–145)
Total Bilirubin: 0.7 mg/dL (ref 0.3–1.2)
Total Protein: 6.9 g/dL (ref 6.5–8.1)

## 2019-05-20 MED ORDER — EPOETIN ALFA-EPBX 40000 UNIT/ML IJ SOLN
40000.0000 [IU] | Freq: Once | INTRAMUSCULAR | Status: AC
  Administered 2019-05-20: 11:00:00 40000 [IU] via SUBCUTANEOUS

## 2019-05-20 MED ORDER — EPOETIN ALFA-EPBX 40000 UNIT/ML IJ SOLN
INTRAMUSCULAR | Status: AC
  Filled 2019-05-20: qty 1

## 2019-05-20 NOTE — Patient Instructions (Signed)

## 2019-05-27 ENCOUNTER — Inpatient Hospital Stay: Payer: Medicare Other

## 2019-05-27 ENCOUNTER — Other Ambulatory Visit: Payer: Self-pay

## 2019-05-27 VITALS — BP 138/71 | HR 74 | Temp 98.1°F | Resp 18

## 2019-05-27 DIAGNOSIS — D461 Refractory anemia with ring sideroblasts: Secondary | ICD-10-CM | POA: Diagnosis not present

## 2019-05-27 DIAGNOSIS — D469 Myelodysplastic syndrome, unspecified: Secondary | ICD-10-CM

## 2019-05-27 LAB — CMP (CANCER CENTER ONLY)
ALT: 52 U/L — ABNORMAL HIGH (ref 0–44)
AST: 24 U/L (ref 15–41)
Albumin: 4.1 g/dL (ref 3.5–5.0)
Alkaline Phosphatase: 64 U/L (ref 38–126)
Anion gap: 6 (ref 5–15)
BUN: 24 mg/dL — ABNORMAL HIGH (ref 8–23)
CO2: 25 mmol/L (ref 22–32)
Calcium: 9 mg/dL (ref 8.9–10.3)
Chloride: 110 mmol/L (ref 98–111)
Creatinine: 0.92 mg/dL (ref 0.61–1.24)
GFR, Est AFR Am: >60 mL/min
GFR, Estimated: >60 mL/min
Glucose, Bld: 103 mg/dL — ABNORMAL HIGH (ref 70–99)
Potassium: 4.2 mmol/L (ref 3.5–5.1)
Sodium: 141 mmol/L (ref 135–145)
Total Bilirubin: 0.7 mg/dL (ref 0.3–1.2)
Total Protein: 7.1 g/dL (ref 6.5–8.1)

## 2019-05-27 LAB — CBC WITH DIFFERENTIAL (CANCER CENTER ONLY)
Abs Immature Granulocytes: 0.06 10*3/uL (ref 0.00–0.07)
Basophils Absolute: 0.1 10*3/uL (ref 0.0–0.1)
Basophils Relative: 1 %
Eosinophils Absolute: 1.1 10*3/uL — ABNORMAL HIGH (ref 0.0–0.5)
Eosinophils Relative: 10 %
HCT: 33.6 % — ABNORMAL LOW (ref 39.0–52.0)
Hemoglobin: 10.9 g/dL — ABNORMAL LOW (ref 13.0–17.0)
Immature Granulocytes: 1 %
Lymphocytes Relative: 18 %
Lymphs Abs: 1.9 10*3/uL (ref 0.7–4.0)
MCH: 29.9 pg (ref 26.0–34.0)
MCHC: 32.4 g/dL (ref 30.0–36.0)
MCV: 92.3 fL (ref 80.0–100.0)
Monocytes Absolute: 1.1 10*3/uL — ABNORMAL HIGH (ref 0.1–1.0)
Monocytes Relative: 10 %
Neutro Abs: 6.4 10*3/uL (ref 1.7–7.7)
Neutrophils Relative %: 60 %
Platelet Count: 247 10*3/uL (ref 150–400)
RBC: 3.64 MIL/uL — ABNORMAL LOW (ref 4.22–5.81)
RDW: 32.8 % — ABNORMAL HIGH (ref 11.5–15.5)
WBC Count: 10.6 10*3/uL — ABNORMAL HIGH (ref 4.0–10.5)
nRBC: 0 % (ref 0.0–0.2)

## 2019-05-27 MED ORDER — EPOETIN ALFA-EPBX 40000 UNIT/ML IJ SOLN
INTRAMUSCULAR | Status: AC
  Filled 2019-05-27: qty 1

## 2019-05-27 MED ORDER — EPOETIN ALFA-EPBX 40000 UNIT/ML IJ SOLN
40000.0000 [IU] | Freq: Once | INTRAMUSCULAR | Status: AC
  Administered 2019-05-27: 10:00:00 40000 [IU] via SUBCUTANEOUS

## 2019-05-27 NOTE — Patient Instructions (Signed)

## 2019-06-03 ENCOUNTER — Inpatient Hospital Stay: Payer: Medicare Other

## 2019-06-03 ENCOUNTER — Other Ambulatory Visit: Payer: Self-pay

## 2019-06-03 VITALS — BP 138/60 | HR 77 | Temp 98.0°F | Resp 18

## 2019-06-03 DIAGNOSIS — D469 Myelodysplastic syndrome, unspecified: Secondary | ICD-10-CM

## 2019-06-03 DIAGNOSIS — D461 Refractory anemia with ring sideroblasts: Secondary | ICD-10-CM | POA: Diagnosis not present

## 2019-06-03 LAB — CMP (CANCER CENTER ONLY)
ALT: 40 U/L (ref 0–44)
AST: 19 U/L (ref 15–41)
Albumin: 4 g/dL (ref 3.5–5.0)
Alkaline Phosphatase: 66 U/L (ref 38–126)
Anion gap: 7 (ref 5–15)
BUN: 23 mg/dL (ref 8–23)
CO2: 26 mmol/L (ref 22–32)
Calcium: 8.6 mg/dL — ABNORMAL LOW (ref 8.9–10.3)
Chloride: 109 mmol/L (ref 98–111)
Creatinine: 1.01 mg/dL (ref 0.61–1.24)
GFR, Est AFR Am: >60 mL/min (ref >60)
GFR, Estimated: >60 mL/min (ref >60)
Glucose, Bld: 103 mg/dL — ABNORMAL HIGH (ref 70–99)
Potassium: 4.6 mmol/L (ref 3.5–5.1)
Sodium: 142 mmol/L (ref 135–145)
Total Bilirubin: 0.8 mg/dL (ref 0.3–1.2)
Total Protein: 7 g/dL (ref 6.5–8.1)

## 2019-06-03 LAB — CBC WITH DIFFERENTIAL (CANCER CENTER ONLY)
Abs Immature Granulocytes: 0.04 10*3/uL (ref 0.00–0.07)
Basophils Absolute: 0.1 10*3/uL (ref 0.0–0.1)
Basophils Relative: 2 %
Eosinophils Absolute: 0.8 10*3/uL — ABNORMAL HIGH (ref 0.0–0.5)
Eosinophils Relative: 9 %
HCT: 32.3 % — ABNORMAL LOW (ref 39.0–52.0)
Hemoglobin: 10.6 g/dL — ABNORMAL LOW (ref 13.0–17.0)
Immature Granulocytes: 0 %
Lymphocytes Relative: 18 %
Lymphs Abs: 1.7 10*3/uL (ref 0.7–4.0)
MCH: 30.3 pg (ref 26.0–34.0)
MCHC: 32.8 g/dL (ref 30.0–36.0)
MCV: 92.3 fL (ref 80.0–100.0)
Monocytes Absolute: 0.8 10*3/uL (ref 0.1–1.0)
Monocytes Relative: 9 %
Neutro Abs: 5.9 10*3/uL (ref 1.7–7.7)
Neutrophils Relative %: 62 %
Platelet Count: 299 10*3/uL (ref 150–400)
RBC: 3.5 MIL/uL — ABNORMAL LOW (ref 4.22–5.81)
RDW: 31.8 % — ABNORMAL HIGH (ref 11.5–15.5)
WBC Count: 9.4 10*3/uL (ref 4.0–10.5)
nRBC: 0 % (ref 0.0–0.2)

## 2019-06-03 MED ORDER — EPOETIN ALFA-EPBX 40000 UNIT/ML IJ SOLN
INTRAMUSCULAR | Status: AC
  Filled 2019-06-03: qty 1

## 2019-06-03 MED ORDER — EPOETIN ALFA-EPBX 40000 UNIT/ML IJ SOLN
40000.0000 [IU] | Freq: Once | INTRAMUSCULAR | Status: AC
  Administered 2019-06-03: 40000 [IU] via SUBCUTANEOUS

## 2019-06-03 NOTE — Patient Instructions (Signed)

## 2019-06-06 ENCOUNTER — Telehealth: Payer: Self-pay | Admitting: Medical Oncology

## 2019-06-06 ENCOUNTER — Other Ambulatory Visit: Payer: Self-pay | Admitting: Internal Medicine

## 2019-06-06 NOTE — Telephone Encounter (Signed)
Okay to add more lab and injection.

## 2019-06-06 NOTE — Telephone Encounter (Signed)
Appts -Pt wants to set up more weekly lab and injection appts  . Next one is march 1 and that is all.  He is not going to [place].

## 2019-06-10 ENCOUNTER — Inpatient Hospital Stay: Payer: Medicare Other

## 2019-06-10 ENCOUNTER — Other Ambulatory Visit: Payer: Self-pay

## 2019-06-10 ENCOUNTER — Other Ambulatory Visit: Payer: Self-pay | Admitting: Medical Oncology

## 2019-06-10 ENCOUNTER — Inpatient Hospital Stay: Payer: Medicare Other | Attending: Internal Medicine

## 2019-06-10 VITALS — BP 128/83 | HR 88 | Temp 98.0°F | Resp 20

## 2019-06-10 DIAGNOSIS — D473 Essential (hemorrhagic) thrombocythemia: Secondary | ICD-10-CM | POA: Insufficient documentation

## 2019-06-10 DIAGNOSIS — D469 Myelodysplastic syndrome, unspecified: Secondary | ICD-10-CM

## 2019-06-10 DIAGNOSIS — D72829 Elevated white blood cell count, unspecified: Secondary | ICD-10-CM | POA: Insufficient documentation

## 2019-06-10 DIAGNOSIS — Z79899 Other long term (current) drug therapy: Secondary | ICD-10-CM | POA: Diagnosis not present

## 2019-06-10 DIAGNOSIS — D461 Refractory anemia with ring sideroblasts: Secondary | ICD-10-CM | POA: Diagnosis present

## 2019-06-10 LAB — CBC WITH DIFFERENTIAL (CANCER CENTER ONLY)
Abs Immature Granulocytes: 0.05 10*3/uL (ref 0.00–0.07)
Basophils Absolute: 0.3 10*3/uL — ABNORMAL HIGH (ref 0.0–0.1)
Basophils Relative: 4 %
Eosinophils Absolute: 0.3 10*3/uL (ref 0.0–0.5)
Eosinophils Relative: 3 %
HCT: 32.8 % — ABNORMAL LOW (ref 39.0–52.0)
Hemoglobin: 10.8 g/dL — ABNORMAL LOW (ref 13.0–17.0)
Immature Granulocytes: 1 %
Lymphocytes Relative: 23 %
Lymphs Abs: 1.8 10*3/uL (ref 0.7–4.0)
MCH: 30.1 pg (ref 26.0–34.0)
MCHC: 32.9 g/dL (ref 30.0–36.0)
MCV: 91.4 fL (ref 80.0–100.0)
Monocytes Absolute: 0.7 10*3/uL (ref 0.1–1.0)
Monocytes Relative: 9 %
Neutro Abs: 4.8 10*3/uL (ref 1.7–7.7)
Neutrophils Relative %: 60 %
Platelet Count: 266 10*3/uL (ref 150–400)
RBC: 3.59 MIL/uL — ABNORMAL LOW (ref 4.22–5.81)
RDW: 31.6 % — ABNORMAL HIGH (ref 11.5–15.5)
WBC Count: 7.9 10*3/uL (ref 4.0–10.5)
nRBC: 0.4 % — ABNORMAL HIGH (ref 0.0–0.2)

## 2019-06-10 LAB — CMP (CANCER CENTER ONLY)
ALT: 36 U/L (ref 0–44)
AST: 20 U/L (ref 15–41)
Albumin: 4.1 g/dL (ref 3.5–5.0)
Alkaline Phosphatase: 59 U/L (ref 38–126)
Anion gap: 6 (ref 5–15)
BUN: 25 mg/dL — ABNORMAL HIGH (ref 8–23)
CO2: 25 mmol/L (ref 22–32)
Calcium: 9.1 mg/dL (ref 8.9–10.3)
Chloride: 110 mmol/L (ref 98–111)
Creatinine: 1.14 mg/dL (ref 0.61–1.24)
GFR, Est AFR Am: >60 mL/min (ref >60)
GFR, Estimated: >60 mL/min (ref >60)
Glucose, Bld: 101 mg/dL — ABNORMAL HIGH (ref 70–99)
Potassium: 4.6 mmol/L (ref 3.5–5.1)
Sodium: 141 mmol/L (ref 135–145)
Total Bilirubin: 0.9 mg/dL (ref 0.3–1.2)
Total Protein: 7.3 g/dL (ref 6.5–8.1)

## 2019-06-10 MED ORDER — EPOETIN ALFA-EPBX 40000 UNIT/ML IJ SOLN
40000.0000 [IU] | Freq: Once | INTRAMUSCULAR | Status: AC
  Administered 2019-06-10: 40000 [IU] via SUBCUTANEOUS

## 2019-06-10 MED ORDER — EPOETIN ALFA-EPBX 40000 UNIT/ML IJ SOLN
INTRAMUSCULAR | Status: AC
  Filled 2019-06-10: qty 1

## 2019-06-10 NOTE — Patient Instructions (Signed)

## 2019-06-17 ENCOUNTER — Inpatient Hospital Stay: Payer: Medicare Other

## 2019-06-17 ENCOUNTER — Other Ambulatory Visit: Payer: Self-pay

## 2019-06-17 DIAGNOSIS — D469 Myelodysplastic syndrome, unspecified: Secondary | ICD-10-CM

## 2019-06-17 DIAGNOSIS — D461 Refractory anemia with ring sideroblasts: Secondary | ICD-10-CM | POA: Diagnosis not present

## 2019-06-17 LAB — CBC WITH DIFFERENTIAL (CANCER CENTER ONLY)
Abs Immature Granulocytes: 0.48 10*3/uL — ABNORMAL HIGH (ref 0.00–0.07)
Basophils Absolute: 0.3 10*3/uL — ABNORMAL HIGH (ref 0.0–0.1)
Basophils Relative: 3 %
Eosinophils Absolute: 0.8 10*3/uL — ABNORMAL HIGH (ref 0.0–0.5)
Eosinophils Relative: 8 %
HCT: 35.1 % — ABNORMAL LOW (ref 39.0–52.0)
Hemoglobin: 11.2 g/dL — ABNORMAL LOW (ref 13.0–17.0)
Immature Granulocytes: 5 %
Lymphocytes Relative: 19 %
Lymphs Abs: 1.9 10*3/uL (ref 0.7–4.0)
MCH: 30.1 pg (ref 26.0–34.0)
MCHC: 31.9 g/dL (ref 30.0–36.0)
MCV: 94.4 fL (ref 80.0–100.0)
Monocytes Absolute: 0.6 10*3/uL (ref 0.1–1.0)
Monocytes Relative: 6 %
Neutro Abs: 6 10*3/uL (ref 1.7–7.7)
Neutrophils Relative %: 59 %
Platelet Count: 273 10*3/uL (ref 150–400)
RBC: 3.72 MIL/uL — ABNORMAL LOW (ref 4.22–5.81)
RDW: 31.9 % — ABNORMAL HIGH (ref 11.5–15.5)
WBC Count: 9.9 10*3/uL (ref 4.0–10.5)
nRBC: 0.2 % (ref 0.0–0.2)

## 2019-06-17 LAB — CMP (CANCER CENTER ONLY)
ALT: 25 U/L (ref 0–44)
AST: 15 U/L (ref 15–41)
Albumin: 4 g/dL (ref 3.5–5.0)
Alkaline Phosphatase: 63 U/L (ref 38–126)
Anion gap: 5 (ref 5–15)
BUN: 24 mg/dL — ABNORMAL HIGH (ref 8–23)
CO2: 26 mmol/L (ref 22–32)
Calcium: 8.9 mg/dL (ref 8.9–10.3)
Chloride: 111 mmol/L (ref 98–111)
Creatinine: 1.05 mg/dL (ref 0.61–1.24)
GFR, Est AFR Am: >60 mL/min (ref >60)
GFR, Estimated: >60 mL/min (ref >60)
Glucose, Bld: 101 mg/dL — ABNORMAL HIGH (ref 70–99)
Potassium: 4.3 mmol/L (ref 3.5–5.1)
Sodium: 142 mmol/L (ref 135–145)
Total Bilirubin: 0.8 mg/dL (ref 0.3–1.2)
Total Protein: 6.9 g/dL (ref 6.5–8.1)

## 2019-06-17 NOTE — Progress Notes (Signed)
HGB is 11.2 today no injection needed at this time. Copy of labs given to patient

## 2019-06-24 ENCOUNTER — Inpatient Hospital Stay: Payer: Medicare Other

## 2019-06-24 ENCOUNTER — Other Ambulatory Visit: Payer: Self-pay

## 2019-06-24 VITALS — BP 157/77 | HR 82 | Temp 98.1°F | Resp 18

## 2019-06-24 DIAGNOSIS — D469 Myelodysplastic syndrome, unspecified: Secondary | ICD-10-CM

## 2019-06-24 DIAGNOSIS — D461 Refractory anemia with ring sideroblasts: Secondary | ICD-10-CM | POA: Diagnosis not present

## 2019-06-24 LAB — CMP (CANCER CENTER ONLY)
ALT: 34 U/L (ref 0–44)
AST: 19 U/L (ref 15–41)
Albumin: 3.8 g/dL (ref 3.5–5.0)
Alkaline Phosphatase: 70 U/L (ref 38–126)
Anion gap: 8 (ref 5–15)
BUN: 16 mg/dL (ref 8–23)
CO2: 26 mmol/L (ref 22–32)
Calcium: 8.6 mg/dL — ABNORMAL LOW (ref 8.9–10.3)
Chloride: 110 mmol/L (ref 98–111)
Creatinine: 0.95 mg/dL (ref 0.61–1.24)
GFR, Est AFR Am: >60 mL/min (ref >60)
GFR, Estimated: >60 mL/min (ref >60)
Glucose, Bld: 131 mg/dL — ABNORMAL HIGH (ref 70–99)
Potassium: 4.8 mmol/L (ref 3.5–5.1)
Sodium: 144 mmol/L (ref 135–145)
Total Bilirubin: 0.5 mg/dL (ref 0.3–1.2)
Total Protein: 6.6 g/dL (ref 6.5–8.1)

## 2019-06-24 LAB — CBC WITH DIFFERENTIAL (CANCER CENTER ONLY)
Abs Immature Granulocytes: 0.03 10*3/uL (ref 0.00–0.07)
Basophils Absolute: 0.2 10*3/uL — ABNORMAL HIGH (ref 0.0–0.1)
Basophils Relative: 2 %
Eosinophils Absolute: 1.1 10*3/uL — ABNORMAL HIGH (ref 0.0–0.5)
Eosinophils Relative: 14 %
HCT: 33.5 % — ABNORMAL LOW (ref 39.0–52.0)
Hemoglobin: 10.6 g/dL — ABNORMAL LOW (ref 13.0–17.0)
Immature Granulocytes: 0 %
Lymphocytes Relative: 20 %
Lymphs Abs: 1.6 10*3/uL (ref 0.7–4.0)
MCH: 29.4 pg (ref 26.0–34.0)
MCHC: 31.6 g/dL (ref 30.0–36.0)
MCV: 93.1 fL (ref 80.0–100.0)
Monocytes Absolute: 0.8 10*3/uL (ref 0.1–1.0)
Monocytes Relative: 10 %
Neutro Abs: 4.3 10*3/uL (ref 1.7–7.7)
Neutrophils Relative %: 54 %
Platelet Count: 147 10*3/uL — ABNORMAL LOW (ref 150–400)
RBC: 3.6 MIL/uL — ABNORMAL LOW (ref 4.22–5.81)
RDW: 31.8 % — ABNORMAL HIGH (ref 11.5–15.5)
WBC Count: 7.9 10*3/uL (ref 4.0–10.5)
nRBC: 0 % (ref 0.0–0.2)

## 2019-06-24 MED ORDER — EPOETIN ALFA-EPBX 40000 UNIT/ML IJ SOLN
INTRAMUSCULAR | Status: AC
  Filled 2019-06-24: qty 1

## 2019-06-24 MED ORDER — EPOETIN ALFA-EPBX 40000 UNIT/ML IJ SOLN
40000.0000 [IU] | Freq: Once | INTRAMUSCULAR | Status: AC
  Administered 2019-06-24: 40000 [IU] via SUBCUTANEOUS

## 2019-06-24 NOTE — Patient Instructions (Signed)

## 2019-07-01 ENCOUNTER — Inpatient Hospital Stay: Payer: Medicare Other

## 2019-07-01 ENCOUNTER — Other Ambulatory Visit: Payer: Self-pay

## 2019-07-01 VITALS — BP 133/73 | HR 68 | Temp 98.2°F | Resp 18

## 2019-07-01 DIAGNOSIS — D469 Myelodysplastic syndrome, unspecified: Secondary | ICD-10-CM

## 2019-07-01 DIAGNOSIS — D461 Refractory anemia with ring sideroblasts: Secondary | ICD-10-CM | POA: Diagnosis not present

## 2019-07-01 LAB — CMP (CANCER CENTER ONLY)
ALT: 43 U/L (ref 0–44)
AST: 22 U/L (ref 15–41)
Albumin: 4 g/dL (ref 3.5–5.0)
Alkaline Phosphatase: 64 U/L (ref 38–126)
Anion gap: 5 (ref 5–15)
BUN: 16 mg/dL (ref 8–23)
CO2: 26 mmol/L (ref 22–32)
Calcium: 8.8 mg/dL — ABNORMAL LOW (ref 8.9–10.3)
Chloride: 111 mmol/L (ref 98–111)
Creatinine: 1.03 mg/dL (ref 0.61–1.24)
GFR, Est AFR Am: >60 mL/min (ref >60)
GFR, Estimated: >60 mL/min (ref >60)
Glucose, Bld: 92 mg/dL (ref 70–99)
Potassium: 4.2 mmol/L (ref 3.5–5.1)
Sodium: 142 mmol/L (ref 135–145)
Total Bilirubin: 0.9 mg/dL (ref 0.3–1.2)
Total Protein: 6.9 g/dL (ref 6.5–8.1)

## 2019-07-01 LAB — CBC WITH DIFFERENTIAL (CANCER CENTER ONLY)
Abs Immature Granulocytes: 0.06 10*3/uL (ref 0.00–0.07)
Basophils Absolute: 0.2 10*3/uL — ABNORMAL HIGH (ref 0.0–0.1)
Basophils Relative: 3 %
Eosinophils Absolute: 1.2 10*3/uL — ABNORMAL HIGH (ref 0.0–0.5)
Eosinophils Relative: 15 %
HCT: 31.7 % — ABNORMAL LOW (ref 39.0–52.0)
Hemoglobin: 10.5 g/dL — ABNORMAL LOW (ref 13.0–17.0)
Immature Granulocytes: 1 %
Lymphocytes Relative: 21 %
Lymphs Abs: 1.8 10*3/uL (ref 0.7–4.0)
MCH: 30 pg (ref 26.0–34.0)
MCHC: 33.1 g/dL (ref 30.0–36.0)
MCV: 90.6 fL (ref 80.0–100.0)
Monocytes Absolute: 0.7 10*3/uL (ref 0.1–1.0)
Monocytes Relative: 9 %
Neutro Abs: 4.2 10*3/uL (ref 1.7–7.7)
Neutrophils Relative %: 51 %
Platelet Count: 201 10*3/uL (ref 150–400)
RBC: 3.5 MIL/uL — ABNORMAL LOW (ref 4.22–5.81)
RDW: 31.1 % — ABNORMAL HIGH (ref 11.5–15.5)
WBC Count: 8.2 10*3/uL (ref 4.0–10.5)
nRBC: 0 % (ref 0.0–0.2)

## 2019-07-01 MED ORDER — EPOETIN ALFA-EPBX 40000 UNIT/ML IJ SOLN
40000.0000 [IU] | Freq: Once | INTRAMUSCULAR | Status: AC
  Administered 2019-07-01: 40000 [IU] via SUBCUTANEOUS

## 2019-07-01 MED ORDER — EPOETIN ALFA-EPBX 40000 UNIT/ML IJ SOLN
INTRAMUSCULAR | Status: AC
  Filled 2019-07-01: qty 1

## 2019-07-01 NOTE — Patient Instructions (Signed)

## 2019-07-08 ENCOUNTER — Other Ambulatory Visit: Payer: Self-pay

## 2019-07-08 ENCOUNTER — Inpatient Hospital Stay: Payer: Medicare Other

## 2019-07-08 VITALS — BP 134/72 | HR 68 | Temp 98.2°F | Resp 18

## 2019-07-08 DIAGNOSIS — D461 Refractory anemia with ring sideroblasts: Secondary | ICD-10-CM | POA: Diagnosis not present

## 2019-07-08 DIAGNOSIS — D469 Myelodysplastic syndrome, unspecified: Secondary | ICD-10-CM

## 2019-07-08 LAB — CBC WITH DIFFERENTIAL (CANCER CENTER ONLY)
Abs Immature Granulocytes: 0.1 10*3/uL — ABNORMAL HIGH (ref 0.00–0.07)
Basophils Absolute: 0.3 10*3/uL — ABNORMAL HIGH (ref 0.0–0.1)
Basophils Relative: 3 %
Eosinophils Absolute: 0.7 10*3/uL — ABNORMAL HIGH (ref 0.0–0.5)
Eosinophils Relative: 8 %
HCT: 32.9 % — ABNORMAL LOW (ref 39.0–52.0)
Hemoglobin: 10.6 g/dL — ABNORMAL LOW (ref 13.0–17.0)
Immature Granulocytes: 1 %
Lymphocytes Relative: 22 %
Lymphs Abs: 2 10*3/uL (ref 0.7–4.0)
MCH: 30.3 pg (ref 26.0–34.0)
MCHC: 32.2 g/dL (ref 30.0–36.0)
MCV: 94 fL (ref 80.0–100.0)
Monocytes Absolute: 0.7 10*3/uL (ref 0.1–1.0)
Monocytes Relative: 8 %
Neutro Abs: 5 10*3/uL (ref 1.7–7.7)
Neutrophils Relative %: 58 %
Platelet Count: 325 10*3/uL (ref 150–400)
RBC: 3.5 MIL/uL — ABNORMAL LOW (ref 4.22–5.81)
RDW: 31.9 % — ABNORMAL HIGH (ref 11.5–15.5)
WBC Count: 8.7 10*3/uL (ref 4.0–10.5)
nRBC: 0.7 % — ABNORMAL HIGH (ref 0.0–0.2)

## 2019-07-08 LAB — CMP (CANCER CENTER ONLY)
ALT: 37 U/L (ref 0–44)
AST: 25 U/L (ref 15–41)
Albumin: 4 g/dL (ref 3.5–5.0)
Alkaline Phosphatase: 79 U/L (ref 38–126)
Anion gap: 8 (ref 5–15)
BUN: 24 mg/dL — ABNORMAL HIGH (ref 8–23)
CO2: 23 mmol/L (ref 22–32)
Calcium: 9.2 mg/dL (ref 8.9–10.3)
Chloride: 113 mmol/L — ABNORMAL HIGH (ref 98–111)
Creatinine: 1.04 mg/dL (ref 0.61–1.24)
GFR, Est AFR Am: >60 mL/min (ref >60)
GFR, Estimated: >60 mL/min (ref >60)
Glucose, Bld: 98 mg/dL (ref 70–99)
Potassium: 4.5 mmol/L (ref 3.5–5.1)
Sodium: 144 mmol/L (ref 135–145)
Total Bilirubin: 0.8 mg/dL (ref 0.3–1.2)
Total Protein: 7.3 g/dL (ref 6.5–8.1)

## 2019-07-08 MED ORDER — EPOETIN ALFA-EPBX 40000 UNIT/ML IJ SOLN
40000.0000 [IU] | Freq: Once | INTRAMUSCULAR | Status: AC
  Administered 2019-07-08: 40000 [IU] via SUBCUTANEOUS

## 2019-07-08 MED ORDER — EPOETIN ALFA-EPBX 40000 UNIT/ML IJ SOLN
INTRAMUSCULAR | Status: AC
  Filled 2019-07-08: qty 1

## 2019-07-08 NOTE — Patient Instructions (Signed)

## 2019-07-15 ENCOUNTER — Inpatient Hospital Stay: Payer: Medicare Other | Attending: Internal Medicine

## 2019-07-15 ENCOUNTER — Other Ambulatory Visit: Payer: Self-pay

## 2019-07-15 ENCOUNTER — Inpatient Hospital Stay: Payer: Medicare Other

## 2019-07-15 VITALS — BP 139/74 | HR 64 | Temp 98.2°F | Resp 18

## 2019-07-15 DIAGNOSIS — R5383 Other fatigue: Secondary | ICD-10-CM | POA: Insufficient documentation

## 2019-07-15 DIAGNOSIS — D461 Refractory anemia with ring sideroblasts: Secondary | ICD-10-CM | POA: Insufficient documentation

## 2019-07-15 DIAGNOSIS — Z8546 Personal history of malignant neoplasm of prostate: Secondary | ICD-10-CM | POA: Insufficient documentation

## 2019-07-15 DIAGNOSIS — D469 Myelodysplastic syndrome, unspecified: Secondary | ICD-10-CM

## 2019-07-15 DIAGNOSIS — Z79899 Other long term (current) drug therapy: Secondary | ICD-10-CM | POA: Insufficient documentation

## 2019-07-15 DIAGNOSIS — D473 Essential (hemorrhagic) thrombocythemia: Secondary | ICD-10-CM | POA: Insufficient documentation

## 2019-07-15 DIAGNOSIS — D72829 Elevated white blood cell count, unspecified: Secondary | ICD-10-CM | POA: Insufficient documentation

## 2019-07-15 LAB — CBC WITH DIFFERENTIAL (CANCER CENTER ONLY)
Abs Immature Granulocytes: 0.12 10*3/uL — ABNORMAL HIGH (ref 0.00–0.07)
Basophils Absolute: 0.2 10*3/uL — ABNORMAL HIGH (ref 0.0–0.1)
Basophils Relative: 2 %
Eosinophils Absolute: 0.6 10*3/uL — ABNORMAL HIGH (ref 0.0–0.5)
Eosinophils Relative: 8 %
HCT: 34.1 % — ABNORMAL LOW (ref 39.0–52.0)
Hemoglobin: 10.9 g/dL — ABNORMAL LOW (ref 13.0–17.0)
Immature Granulocytes: 2 %
Lymphocytes Relative: 22 %
Lymphs Abs: 1.6 10*3/uL (ref 0.7–4.0)
MCH: 29.5 pg (ref 26.0–34.0)
MCHC: 32 g/dL (ref 30.0–36.0)
MCV: 92.2 fL (ref 80.0–100.0)
Monocytes Absolute: 0.5 10*3/uL (ref 0.1–1.0)
Monocytes Relative: 6 %
Neutro Abs: 4.3 10*3/uL (ref 1.7–7.7)
Neutrophils Relative %: 60 %
Platelet Count: 224 10*3/uL (ref 150–400)
RBC: 3.7 MIL/uL — ABNORMAL LOW (ref 4.22–5.81)
RDW: 32 % — ABNORMAL HIGH (ref 11.5–15.5)
WBC Count: 7.3 10*3/uL (ref 4.0–10.5)
nRBC: 0.3 % — ABNORMAL HIGH (ref 0.0–0.2)

## 2019-07-15 LAB — CMP (CANCER CENTER ONLY)
ALT: 34 U/L (ref 0–44)
AST: 20 U/L (ref 15–41)
Albumin: 4 g/dL (ref 3.5–5.0)
Alkaline Phosphatase: 58 U/L (ref 38–126)
Anion gap: 9 (ref 5–15)
BUN: 22 mg/dL (ref 8–23)
CO2: 24 mmol/L (ref 22–32)
Calcium: 8.9 mg/dL (ref 8.9–10.3)
Chloride: 109 mmol/L (ref 98–111)
Creatinine: 1.19 mg/dL (ref 0.61–1.24)
GFR, Est AFR Am: >60 mL/min (ref >60)
GFR, Estimated: >60 mL/min (ref >60)
Glucose, Bld: 94 mg/dL (ref 70–99)
Potassium: 4.7 mmol/L (ref 3.5–5.1)
Sodium: 142 mmol/L (ref 135–145)
Total Bilirubin: 1 mg/dL (ref 0.3–1.2)
Total Protein: 7.2 g/dL (ref 6.5–8.1)

## 2019-07-15 MED ORDER — EPOETIN ALFA-EPBX 40000 UNIT/ML IJ SOLN
INTRAMUSCULAR | Status: AC
  Filled 2019-07-15: qty 1

## 2019-07-15 MED ORDER — EPOETIN ALFA-EPBX 40000 UNIT/ML IJ SOLN
40000.0000 [IU] | Freq: Once | INTRAMUSCULAR | Status: AC
  Administered 2019-07-15: 40000 [IU] via SUBCUTANEOUS

## 2019-07-15 NOTE — Patient Instructions (Signed)

## 2019-07-22 ENCOUNTER — Inpatient Hospital Stay: Payer: Medicare Other

## 2019-07-22 ENCOUNTER — Other Ambulatory Visit: Payer: Self-pay

## 2019-07-22 VITALS — BP 138/78 | HR 66 | Temp 98.2°F | Resp 18

## 2019-07-22 DIAGNOSIS — D461 Refractory anemia with ring sideroblasts: Secondary | ICD-10-CM | POA: Diagnosis not present

## 2019-07-22 DIAGNOSIS — D469 Myelodysplastic syndrome, unspecified: Secondary | ICD-10-CM

## 2019-07-22 LAB — CMP (CANCER CENTER ONLY)
ALT: 44 U/L (ref 0–44)
AST: 23 U/L (ref 15–41)
Albumin: 4.2 g/dL (ref 3.5–5.0)
Alkaline Phosphatase: 65 U/L (ref 38–126)
Anion gap: 8 (ref 5–15)
BUN: 17 mg/dL (ref 8–23)
CO2: 24 mmol/L (ref 22–32)
Calcium: 9 mg/dL (ref 8.9–10.3)
Chloride: 111 mmol/L (ref 98–111)
Creatinine: 1.1 mg/dL (ref 0.61–1.24)
GFR, Est AFR Am: >60 mL/min (ref >60)
GFR, Estimated: >60 mL/min (ref >60)
Glucose, Bld: 98 mg/dL (ref 70–99)
Potassium: 4.2 mmol/L (ref 3.5–5.1)
Sodium: 143 mmol/L (ref 135–145)
Total Bilirubin: 1 mg/dL (ref 0.3–1.2)
Total Protein: 7.5 g/dL (ref 6.5–8.1)

## 2019-07-22 LAB — CBC WITH DIFFERENTIAL (CANCER CENTER ONLY)
Abs Immature Granulocytes: 0.05 10*3/uL (ref 0.00–0.07)
Basophils Absolute: 0.2 10*3/uL — ABNORMAL HIGH (ref 0.0–0.1)
Basophils Relative: 2 %
Eosinophils Absolute: 1.3 10*3/uL — ABNORMAL HIGH (ref 0.0–0.5)
Eosinophils Relative: 13 %
HCT: 35.1 % — ABNORMAL LOW (ref 39.0–52.0)
Hemoglobin: 11.3 g/dL — ABNORMAL LOW (ref 13.0–17.0)
Immature Granulocytes: 1 %
Lymphocytes Relative: 18 %
Lymphs Abs: 1.8 10*3/uL (ref 0.7–4.0)
MCH: 29.2 pg (ref 26.0–34.0)
MCHC: 32.2 g/dL (ref 30.0–36.0)
MCV: 90.7 fL (ref 80.0–100.0)
Monocytes Absolute: 1 10*3/uL (ref 0.1–1.0)
Monocytes Relative: 10 %
Neutro Abs: 5.6 10*3/uL (ref 1.7–7.7)
Neutrophils Relative %: 56 %
Platelet Count: 323 10*3/uL (ref 150–400)
RBC: 3.87 MIL/uL — ABNORMAL LOW (ref 4.22–5.81)
RDW: 31.5 % — ABNORMAL HIGH (ref 11.5–15.5)
WBC Count: 9.9 10*3/uL (ref 4.0–10.5)
nRBC: 0 % (ref 0.0–0.2)

## 2019-07-22 MED ORDER — EPOETIN ALFA-EPBX 40000 UNIT/ML IJ SOLN: 40000.0000 [IU] | Freq: Once | INTRAMUSCULAR | Status: DC

## 2019-07-22 MED ORDER — EPOETIN ALFA-EPBX 40000 UNIT/ML IJ SOLN
INTRAMUSCULAR | Status: AC
  Filled 2019-07-22: qty 1

## 2019-07-22 NOTE — Patient Instructions (Signed)

## 2019-07-22 NOTE — Progress Notes (Signed)
Patients HGB is 11.3 today no injection needed at this time. Copy of labs given to patient

## 2019-07-29 ENCOUNTER — Other Ambulatory Visit: Payer: Self-pay

## 2019-07-29 ENCOUNTER — Inpatient Hospital Stay: Payer: Medicare Other

## 2019-07-29 VITALS — BP 138/72 | HR 68 | Temp 98.7°F | Resp 18

## 2019-07-29 DIAGNOSIS — D469 Myelodysplastic syndrome, unspecified: Secondary | ICD-10-CM

## 2019-07-29 DIAGNOSIS — D461 Refractory anemia with ring sideroblasts: Secondary | ICD-10-CM | POA: Diagnosis not present

## 2019-07-29 LAB — CMP (CANCER CENTER ONLY)
ALT: 43 U/L (ref 0–44)
AST: 18 U/L (ref 15–41)
Albumin: 3.9 g/dL (ref 3.5–5.0)
Alkaline Phosphatase: 66 U/L (ref 38–126)
Anion gap: 7 (ref 5–15)
BUN: 24 mg/dL — ABNORMAL HIGH (ref 8–23)
CO2: 22 mmol/L (ref 22–32)
Calcium: 8.5 mg/dL — ABNORMAL LOW (ref 8.9–10.3)
Chloride: 112 mmol/L — ABNORMAL HIGH (ref 98–111)
Creatinine: 0.99 mg/dL (ref 0.61–1.24)
GFR, Est AFR Am: >60 mL/min (ref >60)
GFR, Estimated: >60 mL/min (ref >60)
Glucose, Bld: 104 mg/dL — ABNORMAL HIGH (ref 70–99)
Potassium: 4.6 mmol/L (ref 3.5–5.1)
Sodium: 141 mmol/L (ref 135–145)
Total Bilirubin: 0.7 mg/dL (ref 0.3–1.2)
Total Protein: 6.8 g/dL (ref 6.5–8.1)

## 2019-07-29 LAB — CBC WITH DIFFERENTIAL (CANCER CENTER ONLY)
Abs Immature Granulocytes: 0.01 10*3/uL (ref 0.00–0.07)
Basophils Absolute: 0.2 10*3/uL — ABNORMAL HIGH (ref 0.0–0.1)
Basophils Relative: 4 %
Eosinophils Absolute: 0.5 10*3/uL (ref 0.0–0.5)
Eosinophils Relative: 9 %
HCT: 32 % — ABNORMAL LOW (ref 39.0–52.0)
Hemoglobin: 10.4 g/dL — ABNORMAL LOW (ref 13.0–17.0)
Immature Granulocytes: 0 %
Lymphocytes Relative: 25 %
Lymphs Abs: 1.4 10*3/uL (ref 0.7–4.0)
MCH: 29.9 pg (ref 26.0–34.0)
MCHC: 32.5 g/dL (ref 30.0–36.0)
MCV: 92 fL (ref 80.0–100.0)
Monocytes Absolute: 0.6 10*3/uL (ref 0.1–1.0)
Monocytes Relative: 11 %
Neutro Abs: 2.9 10*3/uL (ref 1.7–7.7)
Neutrophils Relative %: 51 %
Platelet Count: 190 10*3/uL (ref 150–400)
RBC: 3.48 MIL/uL — ABNORMAL LOW (ref 4.22–5.81)
RDW: 31.5 % — ABNORMAL HIGH (ref 11.5–15.5)
WBC Count: 5.7 10*3/uL (ref 4.0–10.5)
nRBC: 0 % (ref 0.0–0.2)

## 2019-07-29 MED ORDER — EPOETIN ALFA-EPBX 40000 UNIT/ML IJ SOLN
INTRAMUSCULAR | Status: AC
  Filled 2019-07-29: qty 1

## 2019-07-29 MED ORDER — EPOETIN ALFA-EPBX 40000 UNIT/ML IJ SOLN
40000.0000 [IU] | Freq: Once | INTRAMUSCULAR | Status: AC
  Administered 2019-07-29: 40000 [IU] via SUBCUTANEOUS

## 2019-07-29 NOTE — Patient Instructions (Signed)

## 2019-08-02 ENCOUNTER — Other Ambulatory Visit: Payer: Self-pay | Admitting: *Deleted

## 2019-08-02 DIAGNOSIS — D469 Myelodysplastic syndrome, unspecified: Secondary | ICD-10-CM

## 2019-08-05 ENCOUNTER — Inpatient Hospital Stay: Payer: Medicare Other

## 2019-08-05 ENCOUNTER — Other Ambulatory Visit: Payer: Self-pay

## 2019-08-05 VITALS — BP 133/68 | HR 70 | Temp 98.0°F | Resp 18

## 2019-08-05 DIAGNOSIS — D461 Refractory anemia with ring sideroblasts: Secondary | ICD-10-CM | POA: Diagnosis not present

## 2019-08-05 DIAGNOSIS — D469 Myelodysplastic syndrome, unspecified: Secondary | ICD-10-CM

## 2019-08-05 LAB — CBC WITH DIFFERENTIAL (CANCER CENTER ONLY)
Abs Immature Granulocytes: 0.64 10*3/uL — ABNORMAL HIGH (ref 0.00–0.07)
Basophils Absolute: 0.3 10*3/uL — ABNORMAL HIGH (ref 0.0–0.1)
Basophils Relative: 3 %
Eosinophils Absolute: 0.9 10*3/uL — ABNORMAL HIGH (ref 0.0–0.5)
Eosinophils Relative: 9 %
HCT: 31.9 % — ABNORMAL LOW (ref 39.0–52.0)
Hemoglobin: 10.1 g/dL — ABNORMAL LOW (ref 13.0–17.0)
Immature Granulocytes: 6 %
Lymphocytes Relative: 20 %
Lymphs Abs: 2.1 10*3/uL (ref 0.7–4.0)
MCH: 29 pg (ref 26.0–34.0)
MCHC: 31.7 g/dL (ref 30.0–36.0)
MCV: 91.7 fL (ref 80.0–100.0)
Monocytes Absolute: 1 10*3/uL (ref 0.1–1.0)
Monocytes Relative: 9 %
Neutro Abs: 5.6 10*3/uL (ref 1.7–7.7)
Neutrophils Relative %: 53 %
Platelet Count: 485 10*3/uL — ABNORMAL HIGH (ref 150–400)
RBC: 3.48 MIL/uL — ABNORMAL LOW (ref 4.22–5.81)
RDW: 32.8 % — ABNORMAL HIGH (ref 11.5–15.5)
WBC Count: 10.5 10*3/uL (ref 4.0–10.5)
nRBC: 0.4 % — ABNORMAL HIGH (ref 0.0–0.2)

## 2019-08-05 LAB — CMP (CANCER CENTER ONLY)
ALT: 36 U/L (ref 0–44)
AST: 29 U/L (ref 15–41)
Albumin: 3.9 g/dL (ref 3.5–5.0)
Alkaline Phosphatase: 67 U/L (ref 38–126)
Anion gap: 9 (ref 5–15)
BUN: 15 mg/dL (ref 8–23)
CO2: 25 mmol/L (ref 22–32)
Calcium: 8.3 mg/dL — ABNORMAL LOW (ref 8.9–10.3)
Chloride: 111 mmol/L (ref 98–111)
Creatinine: 1.07 mg/dL (ref 0.61–1.24)
GFR, Est AFR Am: >60 mL/min (ref >60)
GFR, Estimated: >60 mL/min (ref >60)
Glucose, Bld: 92 mg/dL (ref 70–99)
Potassium: 4.1 mmol/L (ref 3.5–5.1)
Sodium: 145 mmol/L (ref 135–145)
Total Bilirubin: 0.8 mg/dL (ref 0.3–1.2)
Total Protein: 7 g/dL (ref 6.5–8.1)

## 2019-08-05 MED ORDER — EPOETIN ALFA-EPBX 40000 UNIT/ML IJ SOLN
40000.0000 [IU] | Freq: Once | INTRAMUSCULAR | Status: AC
  Administered 2019-08-05: 40000 [IU] via SUBCUTANEOUS

## 2019-08-05 MED ORDER — EPOETIN ALFA-EPBX 40000 UNIT/ML IJ SOLN
INTRAMUSCULAR | Status: AC
  Filled 2019-08-05: qty 1

## 2019-08-05 NOTE — Patient Instructions (Signed)

## 2019-08-08 ENCOUNTER — Other Ambulatory Visit: Payer: Self-pay | Admitting: Internal Medicine

## 2019-08-08 DIAGNOSIS — D469 Myelodysplastic syndrome, unspecified: Secondary | ICD-10-CM

## 2019-08-09 ENCOUNTER — Other Ambulatory Visit: Payer: Self-pay | Admitting: Physician Assistant

## 2019-08-09 DIAGNOSIS — D469 Myelodysplastic syndrome, unspecified: Secondary | ICD-10-CM

## 2019-08-12 ENCOUNTER — Inpatient Hospital Stay: Payer: Medicare Other

## 2019-08-12 ENCOUNTER — Encounter: Payer: Self-pay | Admitting: Internal Medicine

## 2019-08-12 ENCOUNTER — Inpatient Hospital Stay: Payer: Medicare Other | Attending: Internal Medicine | Admitting: Internal Medicine

## 2019-08-12 ENCOUNTER — Other Ambulatory Visit: Payer: Self-pay

## 2019-08-12 ENCOUNTER — Telehealth: Payer: Self-pay | Admitting: Internal Medicine

## 2019-08-12 VITALS — BP 124/68 | HR 72 | Temp 98.3°F | Resp 18 | Ht 68.0 in | Wt 166.6 lb

## 2019-08-12 DIAGNOSIS — Z79899 Other long term (current) drug therapy: Secondary | ICD-10-CM | POA: Diagnosis not present

## 2019-08-12 DIAGNOSIS — D469 Myelodysplastic syndrome, unspecified: Secondary | ICD-10-CM

## 2019-08-12 DIAGNOSIS — R5383 Other fatigue: Secondary | ICD-10-CM | POA: Insufficient documentation

## 2019-08-12 DIAGNOSIS — D72829 Elevated white blood cell count, unspecified: Secondary | ICD-10-CM | POA: Insufficient documentation

## 2019-08-12 DIAGNOSIS — R202 Paresthesia of skin: Secondary | ICD-10-CM | POA: Insufficient documentation

## 2019-08-12 DIAGNOSIS — Z9079 Acquired absence of other genital organ(s): Secondary | ICD-10-CM | POA: Diagnosis not present

## 2019-08-12 DIAGNOSIS — G629 Polyneuropathy, unspecified: Secondary | ICD-10-CM | POA: Diagnosis not present

## 2019-08-12 DIAGNOSIS — D473 Essential (hemorrhagic) thrombocythemia: Secondary | ICD-10-CM | POA: Diagnosis not present

## 2019-08-12 DIAGNOSIS — Z8546 Personal history of malignant neoplasm of prostate: Secondary | ICD-10-CM | POA: Diagnosis not present

## 2019-08-12 DIAGNOSIS — D461 Refractory anemia with ring sideroblasts: Secondary | ICD-10-CM | POA: Diagnosis present

## 2019-08-12 DIAGNOSIS — Z8719 Personal history of other diseases of the digestive system: Secondary | ICD-10-CM | POA: Diagnosis not present

## 2019-08-12 LAB — CBC WITH DIFFERENTIAL (CANCER CENTER ONLY)
Abs Immature Granulocytes: 0.06 10*3/uL (ref 0.00–0.07)
Basophils Absolute: 0.2 10*3/uL — ABNORMAL HIGH (ref 0.0–0.1)
Basophils Relative: 2 %
Eosinophils Absolute: 0.9 10*3/uL — ABNORMAL HIGH (ref 0.0–0.5)
Eosinophils Relative: 8 %
HCT: 36 % — ABNORMAL LOW (ref 39.0–52.0)
Hemoglobin: 11.2 g/dL — ABNORMAL LOW (ref 13.0–17.0)
Immature Granulocytes: 1 %
Lymphocytes Relative: 19 %
Lymphs Abs: 2 10*3/uL (ref 0.7–4.0)
MCH: 28.9 pg (ref 26.0–34.0)
MCHC: 31.1 g/dL (ref 30.0–36.0)
MCV: 93 fL (ref 80.0–100.0)
Monocytes Absolute: 0.8 10*3/uL (ref 0.1–1.0)
Monocytes Relative: 8 %
Neutro Abs: 6.7 10*3/uL (ref 1.7–7.7)
Neutrophils Relative %: 62 %
Platelet Count: 287 10*3/uL (ref 150–400)
RBC: 3.87 MIL/uL — ABNORMAL LOW (ref 4.22–5.81)
RDW: 32.1 % — ABNORMAL HIGH (ref 11.5–15.5)
WBC Count: 10.5 10*3/uL (ref 4.0–10.5)
nRBC: 0 % (ref 0.0–0.2)

## 2019-08-12 LAB — CMP (CANCER CENTER ONLY)
ALT: 37 U/L (ref 0–44)
AST: 21 U/L (ref 15–41)
Albumin: 4.1 g/dL (ref 3.5–5.0)
Alkaline Phosphatase: 58 U/L (ref 38–126)
Anion gap: 6 (ref 5–15)
BUN: 24 mg/dL — ABNORMAL HIGH (ref 8–23)
CO2: 27 mmol/L (ref 22–32)
Calcium: 9.3 mg/dL (ref 8.9–10.3)
Chloride: 109 mmol/L (ref 98–111)
Creatinine: 1.14 mg/dL (ref 0.61–1.24)
GFR, Est AFR Am: >60 mL/min (ref >60)
GFR, Estimated: >60 mL/min (ref >60)
Glucose, Bld: 97 mg/dL (ref 70–99)
Potassium: 4.6 mmol/L (ref 3.5–5.1)
Sodium: 142 mmol/L (ref 135–145)
Total Bilirubin: 1 mg/dL (ref 0.3–1.2)
Total Protein: 7.4 g/dL (ref 6.5–8.1)

## 2019-08-12 NOTE — Progress Notes (Signed)
[place]  Telephone:(336) [phone] Fax:(336) [phone]  OFFICE PROGRESS NOTE  [name], MD [address] [place] [address]  DIAGNOSIS: 1.  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 Essential thrombocythemia with positive JAK2 mutation 3. Leukocytosis.   PRIOR THERAPY: Previous treatment with combination of Hydrea and anagrelide.  CURRENT THERAPY:  1) Revlimid 5 mg by mouth daily.  Started 03/25/2016.  Status post 24 cycles. 2) anagrelide 1 mg by mouth every other day. 3) Aranesp 300 mcg subcutaneously every 3 weeks.  This is switched to Procrit 400 mcg subcutaneously every 2 weeks starting April 13, 2017.  The frequency of the Procrit was changed to weekly schedule when he was in [place].  He is currently on Retacrit weekly as a biosimilar to Procrit.  INTERVAL HISTORY: Roy Johnson 74 y.o. male returns to the clinic today for follow-up visit.  The patient is feeling fine today with no concerning complaints except for the persistent fatigue and mild neuropathy in his fingers and toes.  He denied having any current chest pain, shortness of breath, cough or hemoptysis.  He denied having any fever or chills.  He has no nausea, vomiting, diarrhea or constipation.  He has no headache or visual changes.  He continues to tolerate his current treatment with Revlimid, anagrelide and Retacrit fairly well.  He is here today for evaluation and repeat blood work.   MEDICAL HISTORY: Past Medical History:  Diagnosis Date  . Anemia   . Colon polyp   . Diverticulitis   . Diverticulosis   . GERD (gastroesophageal reflux disease)   . Hyperlipidemia   . IBS (irritable bowel syndrome)   . Inguinal hernia   . 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  . Prostate cancer ([place])                . Prostate cancer ([place])   . Umbilical hernia   . Vitamin D deficiency     ALLERGIES:  has No Known  Allergies.  MEDICATIONS:  Current Outpatient Medications  Medication Sig Dispense Refill  . amLODipine (NORVASC) 5 MG tablet Take 5 mg by mouth daily.  2  . amphetamine-dextroamphetamine (ADDERALL) 10 MG tablet Take by mouth.    . anagrelide (AGRYLIN) 0.5 MG capsule Take 2 capsules (1 mg total) by mouth daily. 60 capsule 3  . anagrelide (AGRYLIN) 1 MG capsule TAKE 1 CAPSULE(1 MG) BY MOUTH DAILY 90 capsule 0  . aspirin EC 81 MG tablet Take 81 mg by mouth.    . cetirizine (ZYRTEC) 10 MG tablet TAKE 1 TABLET(10 MG) BY MOUTH DAILY    . epoetin alfa-epbx (RETACRIT) 60454 UNIT/ML injection Inject into the skin.    Marland Kitchen gabapentin (NEURONTIN) 100 MG capsule TAKE 1 CAPSULE BY MOUTH TWICE DAILY AND 2 AT NIGHT 120 capsule 3  . hyoscyamine (LEVSIN SL) 0.125 MG SL tablet Place 1 tablet (0.125 mg total) under the tongue every 4 (four) hours as needed. 90 tablet 3  . lenalidomide (REVLIMID) 5 MG capsule Take 5 mg by mouth daily.    . Multiple Vitamin (MULTI-VITAMINS) TABS Take 1 tablet by mouth daily.    . Polyethylene Glycol 3350 (PEG 3350) POWD nightly.    . vitamin B-12 (CYANOCOBALAMIN) 1000 MCG tablet Take 1 tablet by mouth daily as needed.     No current facility-administered medications for this visit.   Facility-Administered Medications Ordered in Other Visits  Medication Dose Route Frequency Provider Last Rate Last Admin  .  0.9 %  sodium chloride infusion  250 mL Intravenous Once Curt Bears, MD        SURGICAL HISTORY:  Past Surgical History:  Procedure Laterality Date  . COLONOSCOPY    . POLYPECTOMY    . PROSTATECTOMY    . TONSILLECTOMY      REVIEW OF SYSTEMS:  A comprehensive review of systems was negative except for: Constitutional: positive for fatigue Neurological: positive for paresthesia   PHYSICAL EXAMINATION: General appearance: alert, cooperative, appears stated age, fatigued and no distress Head: Normocephalic, without obvious abnormality, atraumatic Neck: no  adenopathy, no JVD, supple, symmetrical, trachea midline and thyroid not enlarged, symmetric, no tenderness/mass/nodules Lymph nodes: Cervical, supraclavicular, and axillary nodes normal. Resp: clear to auscultation bilaterally Back: symmetric, no curvature. ROM normal. No CVA tenderness. Cardio: regular rate and rhythm, S1, S2 normal, no murmur, click, rub or gallop GI: abnormal findings:  distended Extremities: extremities normal, atraumatic, no cyanosis or edema  ECOG PERFORMANCE STATUS: 1 - Symptomatic but completely ambulatory  Blood pressure 124/68, pulse 72, temperature 98.3 F (36.8 C), temperature source Temporal, resp. rate 18, height 5\' 8"  (1.727 m), weight 166 lb 9.6 oz (75.6 kg), SpO2 100 %.  LABORATORY DATA:  Lab Results  Component Value Date   WBC 10.5 08/12/2019   HGB 11.2 (L) 08/12/2019   HCT 36.0 (L) 08/12/2019   MCV 93.0 08/12/2019   PLT 287 08/12/2019      Chemistry      Component Value Date/Time   NA 142 08/12/2019 0954   NA 141 12/08/2016 0928   K 4.6 08/12/2019 0954   K 4.5 12/08/2016 0928   CL 109 08/12/2019 0954   CO2 27 08/12/2019 0954   CO2 27 12/08/2016 0928   BUN 24 (H) 08/12/2019 0954   BUN 14.1 12/08/2016 0928   CREATININE 1.14 08/12/2019 0954   CREATININE 0.9 12/08/2016 0928      Component Value Date/Time   CALCIUM 9.3 08/12/2019 0954   CALCIUM 9.0 12/08/2016 0928   ALKPHOS 58 08/12/2019 0954   ALKPHOS 87 12/08/2016 0928   AST 21 08/12/2019 0954   AST 25 12/08/2016 0928   ALT 37 08/12/2019 0954   ALT 41 12/08/2016 0928   BILITOT 1.0 08/12/2019 0954   BILITOT 0.84 12/08/2016 0928      RADIOGRAPHIC STUDIES:  ASSESSMENT AND PLAN:  This is a very pleasant 74 years old white male with essential thrombocythemia with positive JAK-2 mutation as well as myelodysplastic syndrome. He is currently on treatment with Revlimid 5 mg by mouth daily according to the recommendation from [facility].  He is also on treatment with Aranesp every 3  weeks.  Aranesp was a change it to Procrit initially every 2 weeks and currently on weekly basis.  He mentioned that he is feeling much better on Procrit weekly  He has been on the weekly treatment with Procrit for the last 15 months.   He has been tolerating his treatment with Retacrit fairly well. CBC today showed low but stable hemoglobin and hematocrit.  His white blood count and platelets are within the normal range. I recommended for the patient to continue his current treatment with the weekly Retacrit as needed. I will see him back for follow-up visit in 6 months for evaluation and repeat blood work. He was advised to call immediately if he has any concerning symptoms in the interval. All questions were answered. The patient knows to call the clinic with any problems, questions or concerns. We can  certainly see the patient much sooner if necessary.   Disclaimer: This note was dictated with voice recognition software. Similar sounding words can inadvertently be transcribed and may not be corrected upon review.

## 2019-08-12 NOTE — Telephone Encounter (Signed)
Scheduled appt per 5/3 los - pt is aware of appt date and time

## 2019-08-19 ENCOUNTER — Ambulatory Visit: Payer: Medicare Other

## 2019-08-19 ENCOUNTER — Other Ambulatory Visit: Payer: Self-pay | Admitting: Medical Oncology

## 2019-08-19 ENCOUNTER — Other Ambulatory Visit: Payer: Medicare Other

## 2019-08-19 DIAGNOSIS — D473 Essential (hemorrhagic) thrombocythemia: Secondary | ICD-10-CM

## 2019-08-20 ENCOUNTER — Other Ambulatory Visit: Payer: Self-pay

## 2019-08-20 ENCOUNTER — Inpatient Hospital Stay: Payer: Medicare Other

## 2019-08-20 VITALS — BP 148/72 | HR 67 | Temp 98.6°F | Resp 18

## 2019-08-20 DIAGNOSIS — D461 Refractory anemia with ring sideroblasts: Secondary | ICD-10-CM | POA: Diagnosis not present

## 2019-08-20 DIAGNOSIS — D473 Essential (hemorrhagic) thrombocythemia: Secondary | ICD-10-CM

## 2019-08-20 DIAGNOSIS — D469 Myelodysplastic syndrome, unspecified: Secondary | ICD-10-CM

## 2019-08-20 LAB — CBC WITH DIFFERENTIAL (CANCER CENTER ONLY)
Abs Immature Granulocytes: 0.02 10*3/uL (ref 0.00–0.07)
Basophils Absolute: 0.2 10*3/uL — ABNORMAL HIGH (ref 0.0–0.1)
Basophils Relative: 3 %
Eosinophils Absolute: 1 10*3/uL — ABNORMAL HIGH (ref 0.0–0.5)
Eosinophils Relative: 16 %
HCT: 30.9 % — ABNORMAL LOW (ref 39.0–52.0)
Hemoglobin: 9.9 g/dL — ABNORMAL LOW (ref 13.0–17.0)
Immature Granulocytes: 0 %
Lymphocytes Relative: 24 %
Lymphs Abs: 1.6 10*3/uL (ref 0.7–4.0)
MCH: 29.1 pg (ref 26.0–34.0)
MCHC: 32 g/dL (ref 30.0–36.0)
MCV: 90.9 fL (ref 80.0–100.0)
Monocytes Absolute: 0.8 10*3/uL (ref 0.1–1.0)
Monocytes Relative: 12 %
Neutro Abs: 2.9 10*3/uL (ref 1.7–7.7)
Neutrophils Relative %: 45 %
Platelet Count: 184 10*3/uL (ref 150–400)
RBC: 3.4 MIL/uL — ABNORMAL LOW (ref 4.22–5.81)
RDW: 32.1 % — ABNORMAL HIGH (ref 11.5–15.5)
WBC Count: 6.5 10*3/uL (ref 4.0–10.5)
nRBC: 0 % (ref 0.0–0.2)

## 2019-08-20 LAB — CMP (CANCER CENTER ONLY)
ALT: 42 U/L (ref 0–44)
AST: 21 U/L (ref 15–41)
Albumin: 3.9 g/dL (ref 3.5–5.0)
Alkaline Phosphatase: 64 U/L (ref 38–126)
Anion gap: 6 (ref 5–15)
BUN: 19 mg/dL (ref 8–23)
CO2: 24 mmol/L (ref 22–32)
Calcium: 8.9 mg/dL (ref 8.9–10.3)
Chloride: 111 mmol/L (ref 98–111)
Creatinine: 0.95 mg/dL (ref 0.61–1.24)
GFR, Est AFR Am: >60 mL/min (ref >60)
GFR, Estimated: >60 mL/min (ref >60)
Glucose, Bld: 101 mg/dL — ABNORMAL HIGH (ref 70–99)
Potassium: 4.5 mmol/L (ref 3.5–5.1)
Sodium: 141 mmol/L (ref 135–145)
Total Bilirubin: 0.7 mg/dL (ref 0.3–1.2)
Total Protein: 6.7 g/dL (ref 6.5–8.1)

## 2019-08-20 MED ORDER — EPOETIN ALFA-EPBX 40000 UNIT/ML IJ SOLN
INTRAMUSCULAR | Status: AC
  Filled 2019-08-20: qty 1

## 2019-08-20 MED ORDER — EPOETIN ALFA-EPBX 40000 UNIT/ML IJ SOLN
40000.0000 [IU] | Freq: Once | INTRAMUSCULAR | Status: AC
  Administered 2019-08-20: 40000 [IU] via SUBCUTANEOUS

## 2019-08-20 NOTE — Patient Instructions (Signed)

## 2019-08-23 ENCOUNTER — Other Ambulatory Visit: Payer: Self-pay | Admitting: Physician Assistant

## 2019-08-23 DIAGNOSIS — D469 Myelodysplastic syndrome, unspecified: Secondary | ICD-10-CM

## 2019-08-26 ENCOUNTER — Inpatient Hospital Stay: Payer: Medicare Other

## 2019-08-26 ENCOUNTER — Other Ambulatory Visit: Payer: Self-pay

## 2019-08-26 VITALS — BP 138/71 | HR 72 | Temp 98.3°F | Resp 20

## 2019-08-26 DIAGNOSIS — D469 Myelodysplastic syndrome, unspecified: Secondary | ICD-10-CM

## 2019-08-26 DIAGNOSIS — D461 Refractory anemia with ring sideroblasts: Secondary | ICD-10-CM | POA: Diagnosis not present

## 2019-08-26 LAB — CMP (CANCER CENTER ONLY)
ALT: 56 U/L — ABNORMAL HIGH (ref 0–44)
AST: 25 U/L (ref 15–41)
Albumin: 4 g/dL (ref 3.5–5.0)
Alkaline Phosphatase: 62 U/L (ref 38–126)
Anion gap: 9 (ref 5–15)
BUN: 21 mg/dL (ref 8–23)
CO2: 22 mmol/L (ref 22–32)
Calcium: 8.7 mg/dL — ABNORMAL LOW (ref 8.9–10.3)
Chloride: 111 mmol/L (ref 98–111)
Creatinine: 1.16 mg/dL (ref 0.61–1.24)
GFR, Est AFR Am: >60 mL/min (ref >60)
GFR, Estimated: >60 mL/min (ref >60)
Glucose, Bld: 132 mg/dL — ABNORMAL HIGH (ref 70–99)
Potassium: 4.6 mmol/L (ref 3.5–5.1)
Sodium: 142 mmol/L (ref 135–145)
Total Bilirubin: 0.8 mg/dL (ref 0.3–1.2)
Total Protein: 6.9 g/dL (ref 6.5–8.1)

## 2019-08-26 LAB — CBC WITH DIFFERENTIAL (CANCER CENTER ONLY)
Abs Immature Granulocytes: 0.16 10*3/uL — ABNORMAL HIGH (ref 0.00–0.07)
Basophils Absolute: 0.3 10*3/uL — ABNORMAL HIGH (ref 0.0–0.1)
Basophils Relative: 3 %
Eosinophils Absolute: 0.5 10*3/uL (ref 0.0–0.5)
Eosinophils Relative: 5 %
HCT: 30.2 % — ABNORMAL LOW (ref 39.0–52.0)
Hemoglobin: 9.6 g/dL — ABNORMAL LOW (ref 13.0–17.0)
Immature Granulocytes: 2 %
Lymphocytes Relative: 19 %
Lymphs Abs: 1.8 10*3/uL (ref 0.7–4.0)
MCH: 29.1 pg (ref 26.0–34.0)
MCHC: 31.8 g/dL (ref 30.0–36.0)
MCV: 91.5 fL (ref 80.0–100.0)
Monocytes Absolute: 0.8 10*3/uL (ref 0.1–1.0)
Monocytes Relative: 9 %
Neutro Abs: 6 10*3/uL (ref 1.7–7.7)
Neutrophils Relative %: 62 %
Platelet Count: 260 10*3/uL (ref 150–400)
RBC: 3.3 MIL/uL — ABNORMAL LOW (ref 4.22–5.81)
RDW: 31.5 % — ABNORMAL HIGH (ref 11.5–15.5)
WBC Count: 9.5 10*3/uL (ref 4.0–10.5)
nRBC: 0.4 % — ABNORMAL HIGH (ref 0.0–0.2)

## 2019-08-26 MED ORDER — EPOETIN ALFA-EPBX 40000 UNIT/ML IJ SOLN
40000.0000 [IU] | Freq: Once | INTRAMUSCULAR | Status: AC
  Administered 2019-08-26: 40000 [IU] via SUBCUTANEOUS

## 2019-08-26 MED ORDER — EPOETIN ALFA-EPBX 40000 UNIT/ML IJ SOLN
INTRAMUSCULAR | Status: AC
  Filled 2019-08-26: qty 1

## 2019-08-26 NOTE — Patient Instructions (Signed)

## 2019-09-02 ENCOUNTER — Other Ambulatory Visit: Payer: Self-pay

## 2019-09-02 ENCOUNTER — Inpatient Hospital Stay: Payer: Medicare Other

## 2019-09-02 VITALS — BP 139/77 | HR 71 | Resp 18

## 2019-09-02 DIAGNOSIS — D461 Refractory anemia with ring sideroblasts: Secondary | ICD-10-CM | POA: Diagnosis not present

## 2019-09-02 DIAGNOSIS — D469 Myelodysplastic syndrome, unspecified: Secondary | ICD-10-CM

## 2019-09-02 LAB — CMP (CANCER CENTER ONLY)
ALT: 37 U/L (ref 0–44)
AST: 21 U/L (ref 15–41)
Albumin: 4.1 g/dL (ref 3.5–5.0)
Alkaline Phosphatase: 56 U/L (ref 38–126)
Anion gap: 7 (ref 5–15)
BUN: 22 mg/dL (ref 8–23)
CO2: 24 mmol/L (ref 22–32)
Calcium: 8.9 mg/dL (ref 8.9–10.3)
Chloride: 111 mmol/L (ref 98–111)
Creatinine: 1.12 mg/dL (ref 0.61–1.24)
GFR, Est AFR Am: >60 mL/min (ref >60)
GFR, Estimated: >60 mL/min (ref >60)
Glucose, Bld: 96 mg/dL (ref 70–99)
Potassium: 4.8 mmol/L (ref 3.5–5.1)
Sodium: 142 mmol/L (ref 135–145)
Total Bilirubin: 0.9 mg/dL (ref 0.3–1.2)
Total Protein: 7.3 g/dL (ref 6.5–8.1)

## 2019-09-02 LAB — CBC WITH DIFFERENTIAL (CANCER CENTER ONLY)
Abs Immature Granulocytes: 0.57 10*3/uL — ABNORMAL HIGH (ref 0.00–0.07)
Basophils Absolute: 0.2 10*3/uL — ABNORMAL HIGH (ref 0.0–0.1)
Basophils Relative: 2 %
Eosinophils Absolute: 0.8 10*3/uL — ABNORMAL HIGH (ref 0.0–0.5)
Eosinophils Relative: 8 %
HCT: 34.1 % — ABNORMAL LOW (ref 39.0–52.0)
Hemoglobin: 10.6 g/dL — ABNORMAL LOW (ref 13.0–17.0)
Immature Granulocytes: 6 %
Lymphocytes Relative: 20 %
Lymphs Abs: 1.9 10*3/uL (ref 0.7–4.0)
MCH: 28.8 pg (ref 26.0–34.0)
MCHC: 31.1 g/dL (ref 30.0–36.0)
MCV: 92.7 fL (ref 80.0–100.0)
Monocytes Absolute: 0.6 10*3/uL (ref 0.1–1.0)
Monocytes Relative: 6 %
Neutro Abs: 5.6 10*3/uL (ref 1.7–7.7)
Neutrophils Relative %: 58 %
Platelet Count: 245 10*3/uL (ref 150–400)
RBC: 3.68 MIL/uL — ABNORMAL LOW (ref 4.22–5.81)
RDW: 32.8 % — ABNORMAL HIGH (ref 11.5–15.5)
WBC Count: 9.6 10*3/uL (ref 4.0–10.5)
nRBC: 0.2 % (ref 0.0–0.2)

## 2019-09-02 MED ORDER — EPOETIN ALFA-EPBX 40000 UNIT/ML IJ SOLN
40000.0000 [IU] | Freq: Once | INTRAMUSCULAR | Status: AC
  Administered 2019-09-02: 40000 [IU] via SUBCUTANEOUS

## 2019-09-02 MED ORDER — EPOETIN ALFA-EPBX 40000 UNIT/ML IJ SOLN
INTRAMUSCULAR | Status: AC
  Filled 2019-09-02: qty 1

## 2019-09-02 NOTE — Patient Instructions (Signed)

## 2019-09-10 ENCOUNTER — Other Ambulatory Visit: Payer: Self-pay

## 2019-09-10 ENCOUNTER — Inpatient Hospital Stay: Payer: Medicare Other

## 2019-09-10 ENCOUNTER — Inpatient Hospital Stay: Payer: Medicare Other | Attending: Internal Medicine

## 2019-09-10 VITALS — BP 126/76 | HR 80 | Resp 20

## 2019-09-10 DIAGNOSIS — D461 Refractory anemia with ring sideroblasts: Secondary | ICD-10-CM | POA: Diagnosis present

## 2019-09-10 DIAGNOSIS — D469 Myelodysplastic syndrome, unspecified: Secondary | ICD-10-CM

## 2019-09-10 DIAGNOSIS — D72829 Elevated white blood cell count, unspecified: Secondary | ICD-10-CM | POA: Diagnosis not present

## 2019-09-10 DIAGNOSIS — D473 Essential (hemorrhagic) thrombocythemia: Secondary | ICD-10-CM | POA: Insufficient documentation

## 2019-09-10 DIAGNOSIS — Z8546 Personal history of malignant neoplasm of prostate: Secondary | ICD-10-CM | POA: Insufficient documentation

## 2019-09-10 DIAGNOSIS — Z79899 Other long term (current) drug therapy: Secondary | ICD-10-CM | POA: Diagnosis not present

## 2019-09-10 LAB — CMP (CANCER CENTER ONLY)
ALT: 29 U/L (ref 0–44)
AST: 17 U/L (ref 15–41)
Albumin: 4 g/dL (ref 3.5–5.0)
Alkaline Phosphatase: 61 U/L (ref 38–126)
Anion gap: 9 (ref 5–15)
BUN: 23 mg/dL (ref 8–23)
CO2: 22 mmol/L (ref 22–32)
Calcium: 9.2 mg/dL (ref 8.9–10.3)
Chloride: 108 mmol/L (ref 98–111)
Creatinine: 1.16 mg/dL (ref 0.61–1.24)
GFR, Est AFR Am: >60 mL/min (ref >60)
GFR, Estimated: >60 mL/min (ref >60)
Glucose, Bld: 97 mg/dL (ref 70–99)
Potassium: 4.8 mmol/L (ref 3.5–5.1)
Sodium: 139 mmol/L (ref 135–145)
Total Bilirubin: 0.9 mg/dL (ref 0.3–1.2)
Total Protein: 7.1 g/dL (ref 6.5–8.1)

## 2019-09-10 LAB — CBC WITH DIFFERENTIAL (CANCER CENTER ONLY)
Abs Immature Granulocytes: 0.04 10*3/uL (ref 0.00–0.07)
Basophils Absolute: 0.2 10*3/uL — ABNORMAL HIGH (ref 0.0–0.1)
Basophils Relative: 2 %
Eosinophils Absolute: 1 10*3/uL — ABNORMAL HIGH (ref 0.0–0.5)
Eosinophils Relative: 12 %
HCT: 33.7 % — ABNORMAL LOW (ref 39.0–52.0)
Hemoglobin: 10.8 g/dL — ABNORMAL LOW (ref 13.0–17.0)
Immature Granulocytes: 1 %
Lymphocytes Relative: 20 %
Lymphs Abs: 1.7 10*3/uL (ref 0.7–4.0)
MCH: 29.1 pg (ref 26.0–34.0)
MCHC: 32 g/dL (ref 30.0–36.0)
MCV: 90.8 fL (ref 80.0–100.0)
Monocytes Absolute: 1 10*3/uL (ref 0.1–1.0)
Monocytes Relative: 11 %
Neutro Abs: 4.6 10*3/uL (ref 1.7–7.7)
Neutrophils Relative %: 54 %
Platelet Count: 225 10*3/uL (ref 150–400)
RBC: 3.71 MIL/uL — ABNORMAL LOW (ref 4.22–5.81)
RDW: 30.8 % — ABNORMAL HIGH (ref 11.5–15.5)
WBC Count: 8.6 10*3/uL (ref 4.0–10.5)
nRBC: 0 % (ref 0.0–0.2)

## 2019-09-10 MED ORDER — EPOETIN ALFA-EPBX 40000 UNIT/ML IJ SOLN
40000.0000 [IU] | Freq: Once | INTRAMUSCULAR | Status: AC
  Administered 2019-09-10: 40000 [IU] via SUBCUTANEOUS

## 2019-09-10 MED ORDER — EPOETIN ALFA-EPBX 40000 UNIT/ML IJ SOLN
INTRAMUSCULAR | Status: AC
  Filled 2019-09-10: qty 1

## 2019-09-10 NOTE — Patient Instructions (Signed)

## 2019-09-16 ENCOUNTER — Inpatient Hospital Stay: Payer: Medicare Other

## 2019-09-16 ENCOUNTER — Other Ambulatory Visit: Payer: Self-pay

## 2019-09-16 VITALS — BP 122/74 | HR 72 | Temp 98.0°F | Resp 18

## 2019-09-16 DIAGNOSIS — D461 Refractory anemia with ring sideroblasts: Secondary | ICD-10-CM | POA: Diagnosis not present

## 2019-09-16 DIAGNOSIS — D469 Myelodysplastic syndrome, unspecified: Secondary | ICD-10-CM

## 2019-09-16 LAB — CBC WITH DIFFERENTIAL (CANCER CENTER ONLY)
Abs Immature Granulocytes: 0.04 10*3/uL (ref 0.00–0.07)
Basophils Absolute: 0.2 10*3/uL — ABNORMAL HIGH (ref 0.0–0.1)
Basophils Relative: 3 %
Eosinophils Absolute: 1.2 10*3/uL — ABNORMAL HIGH (ref 0.0–0.5)
Eosinophils Relative: 16 %
HCT: 33.8 % — ABNORMAL LOW (ref 39.0–52.0)
Hemoglobin: 10.8 g/dL — ABNORMAL LOW (ref 13.0–17.0)
Immature Granulocytes: 1 %
Lymphocytes Relative: 22 %
Lymphs Abs: 1.6 10*3/uL (ref 0.7–4.0)
MCH: 29 pg (ref 26.0–34.0)
MCHC: 32 g/dL (ref 30.0–36.0)
MCV: 90.6 fL (ref 80.0–100.0)
Monocytes Absolute: 0.7 10*3/uL (ref 0.1–1.0)
Monocytes Relative: 9 %
Neutro Abs: 3.7 10*3/uL (ref 1.7–7.7)
Neutrophils Relative %: 49 %
Platelet Count: 227 10*3/uL (ref 150–400)
RBC: 3.73 MIL/uL — ABNORMAL LOW (ref 4.22–5.81)
RDW: 31.5 % — ABNORMAL HIGH (ref 11.5–15.5)
WBC Count: 7.5 10*3/uL (ref 4.0–10.5)
nRBC: 0 % (ref 0.0–0.2)

## 2019-09-16 LAB — CMP (CANCER CENTER ONLY)
ALT: 44 U/L (ref 0–44)
AST: 21 U/L (ref 15–41)
Albumin: 4 g/dL (ref 3.5–5.0)
Alkaline Phosphatase: 57 U/L (ref 38–126)
Anion gap: 8 (ref 5–15)
BUN: 19 mg/dL (ref 8–23)
CO2: 24 mmol/L (ref 22–32)
Calcium: 9.1 mg/dL (ref 8.9–10.3)
Chloride: 110 mmol/L (ref 98–111)
Creatinine: 1.12 mg/dL (ref 0.61–1.24)
GFR, Est AFR Am: >60 mL/min (ref >60)
GFR, Estimated: >60 mL/min (ref >60)
Glucose, Bld: 119 mg/dL — ABNORMAL HIGH (ref 70–99)
Potassium: 4.4 mmol/L (ref 3.5–5.1)
Sodium: 142 mmol/L (ref 135–145)
Total Bilirubin: 1.1 mg/dL (ref 0.3–1.2)
Total Protein: 7 g/dL (ref 6.5–8.1)

## 2019-09-16 MED ORDER — EPOETIN ALFA-EPBX 40000 UNIT/ML IJ SOLN
40000.0000 [IU] | Freq: Once | INTRAMUSCULAR | Status: AC
  Administered 2019-09-16: 40000 [IU] via SUBCUTANEOUS

## 2019-09-16 MED ORDER — EPOETIN ALFA-EPBX 40000 UNIT/ML IJ SOLN
INTRAMUSCULAR | Status: AC
  Filled 2019-09-16: qty 1

## 2019-09-16 NOTE — Patient Instructions (Signed)

## 2019-09-23 ENCOUNTER — Inpatient Hospital Stay: Payer: Medicare Other

## 2019-09-23 ENCOUNTER — Other Ambulatory Visit: Payer: Self-pay

## 2019-09-23 VITALS — BP 128/72 | HR 72 | Temp 98.2°F | Resp 18

## 2019-09-23 DIAGNOSIS — D461 Refractory anemia with ring sideroblasts: Secondary | ICD-10-CM | POA: Diagnosis not present

## 2019-09-23 DIAGNOSIS — D469 Myelodysplastic syndrome, unspecified: Secondary | ICD-10-CM

## 2019-09-23 LAB — CBC WITH DIFFERENTIAL (CANCER CENTER ONLY)
Abs Immature Granulocytes: 0.04 10*3/uL (ref 0.00–0.07)
Basophils Absolute: 0.3 10*3/uL — ABNORMAL HIGH (ref 0.0–0.1)
Basophils Relative: 3 %
Eosinophils Absolute: 0.4 10*3/uL (ref 0.0–0.5)
Eosinophils Relative: 6 %
HCT: 30.3 % — ABNORMAL LOW (ref 39.0–52.0)
Hemoglobin: 9.8 g/dL — ABNORMAL LOW (ref 13.0–17.0)
Immature Granulocytes: 1 %
Lymphocytes Relative: 23 %
Lymphs Abs: 1.7 10*3/uL (ref 0.7–4.0)
MCH: 28.9 pg (ref 26.0–34.0)
MCHC: 32.3 g/dL (ref 30.0–36.0)
MCV: 89.4 fL (ref 80.0–100.0)
Monocytes Absolute: 0.7 10*3/uL (ref 0.1–1.0)
Monocytes Relative: 9 %
Neutro Abs: 4.5 10*3/uL (ref 1.7–7.7)
Neutrophils Relative %: 58 %
Platelet Count: 220 10*3/uL (ref 150–400)
RBC: 3.39 MIL/uL — ABNORMAL LOW (ref 4.22–5.81)
RDW: 31.4 % — ABNORMAL HIGH (ref 11.5–15.5)
WBC Count: 7.6 10*3/uL (ref 4.0–10.5)
nRBC: 0 % (ref 0.0–0.2)

## 2019-09-23 LAB — CMP (CANCER CENTER ONLY)
ALT: 32 U/L (ref 0–44)
AST: 18 U/L (ref 15–41)
Albumin: 3.9 g/dL (ref 3.5–5.0)
Alkaline Phosphatase: 56 U/L (ref 38–126)
Anion gap: 9 (ref 5–15)
BUN: 24 mg/dL — ABNORMAL HIGH (ref 8–23)
CO2: 23 mmol/L (ref 22–32)
Calcium: 9.2 mg/dL (ref 8.9–10.3)
Chloride: 112 mmol/L — ABNORMAL HIGH (ref 98–111)
Creatinine: 1.06 mg/dL (ref 0.61–1.24)
GFR, Est AFR Am: >60 mL/min (ref >60)
GFR, Estimated: >60 mL/min (ref >60)
Glucose, Bld: 95 mg/dL (ref 70–99)
Potassium: 4.6 mmol/L (ref 3.5–5.1)
Sodium: 144 mmol/L (ref 135–145)
Total Bilirubin: 0.9 mg/dL (ref 0.3–1.2)
Total Protein: 6.8 g/dL (ref 6.5–8.1)

## 2019-09-23 MED ORDER — EPOETIN ALFA-EPBX 40000 UNIT/ML IJ SOLN
INTRAMUSCULAR | Status: AC
  Filled 2019-09-23: qty 1

## 2019-09-23 MED ORDER — EPOETIN ALFA-EPBX 40000 UNIT/ML IJ SOLN
40000.0000 [IU] | Freq: Once | INTRAMUSCULAR | Status: AC
  Administered 2019-09-23: 40000 [IU] via SUBCUTANEOUS

## 2019-09-23 NOTE — Patient Instructions (Signed)

## 2019-09-30 ENCOUNTER — Other Ambulatory Visit: Payer: Self-pay

## 2019-09-30 ENCOUNTER — Inpatient Hospital Stay: Payer: Medicare Other

## 2019-09-30 VITALS — BP 143/73 | HR 80 | Resp 18

## 2019-09-30 DIAGNOSIS — D469 Myelodysplastic syndrome, unspecified: Secondary | ICD-10-CM

## 2019-09-30 DIAGNOSIS — D461 Refractory anemia with ring sideroblasts: Secondary | ICD-10-CM | POA: Diagnosis not present

## 2019-09-30 LAB — CMP (CANCER CENTER ONLY)
ALT: 37 U/L (ref 0–44)
AST: 20 U/L (ref 15–41)
Albumin: 4.1 g/dL (ref 3.5–5.0)
Alkaline Phosphatase: 70 U/L (ref 38–126)
Anion gap: 10 (ref 5–15)
BUN: 28 mg/dL — ABNORMAL HIGH (ref 8–23)
CO2: 24 mmol/L (ref 22–32)
Calcium: 9.6 mg/dL (ref 8.9–10.3)
Chloride: 108 mmol/L (ref 98–111)
Creatinine: 1.14 mg/dL (ref 0.61–1.24)
GFR, Est AFR Am: >60 mL/min (ref >60)
GFR, Estimated: >60 mL/min (ref >60)
Glucose, Bld: 107 mg/dL — ABNORMAL HIGH (ref 70–99)
Potassium: 4.6 mmol/L (ref 3.5–5.1)
Sodium: 142 mmol/L (ref 135–145)
Total Bilirubin: 0.9 mg/dL (ref 0.3–1.2)
Total Protein: 7.4 g/dL (ref 6.5–8.1)

## 2019-09-30 LAB — CBC WITH DIFFERENTIAL (CANCER CENTER ONLY)
Abs Immature Granulocytes: 0.24 10*3/uL — ABNORMAL HIGH (ref 0.00–0.07)
Basophils Absolute: 0.2 10*3/uL — ABNORMAL HIGH (ref 0.0–0.1)
Basophils Relative: 2 %
Eosinophils Absolute: 0.8 10*3/uL — ABNORMAL HIGH (ref 0.0–0.5)
Eosinophils Relative: 7 %
HCT: 31.5 % — ABNORMAL LOW (ref 39.0–52.0)
Hemoglobin: 10.1 g/dL — ABNORMAL LOW (ref 13.0–17.0)
Immature Granulocytes: 2 %
Lymphocytes Relative: 16 %
Lymphs Abs: 1.7 10*3/uL (ref 0.7–4.0)
MCH: 28.5 pg (ref 26.0–34.0)
MCHC: 32.1 g/dL (ref 30.0–36.0)
MCV: 89 fL (ref 80.0–100.0)
Monocytes Absolute: 0.6 10*3/uL (ref 0.1–1.0)
Monocytes Relative: 6 %
Neutro Abs: 7.3 10*3/uL (ref 1.7–7.7)
Neutrophils Relative %: 67 %
Platelet Count: 308 10*3/uL (ref 150–400)
RBC: 3.54 MIL/uL — ABNORMAL LOW (ref 4.22–5.81)
RDW: 32.4 % — ABNORMAL HIGH (ref 11.5–15.5)
WBC Count: 10.8 10*3/uL — ABNORMAL HIGH (ref 4.0–10.5)
nRBC: 0.5 % — ABNORMAL HIGH (ref 0.0–0.2)

## 2019-09-30 MED ORDER — EPOETIN ALFA-EPBX 40000 UNIT/ML IJ SOLN
40000.0000 [IU] | Freq: Once | INTRAMUSCULAR | Status: AC
  Administered 2019-09-30: 40000 [IU] via SUBCUTANEOUS

## 2019-09-30 MED ORDER — EPOETIN ALFA-EPBX 40000 UNIT/ML IJ SOLN
INTRAMUSCULAR | Status: AC
  Filled 2019-09-30: qty 1

## 2019-09-30 NOTE — Patient Instructions (Signed)

## 2019-10-07 ENCOUNTER — Inpatient Hospital Stay: Payer: Medicare Other

## 2019-10-07 ENCOUNTER — Other Ambulatory Visit: Payer: Self-pay

## 2019-10-07 VITALS — BP 142/62 | HR 77 | Resp 18

## 2019-10-07 DIAGNOSIS — D461 Refractory anemia with ring sideroblasts: Secondary | ICD-10-CM | POA: Diagnosis not present

## 2019-10-07 DIAGNOSIS — D469 Myelodysplastic syndrome, unspecified: Secondary | ICD-10-CM

## 2019-10-07 LAB — CMP (CANCER CENTER ONLY)
ALT: 44 U/L (ref 0–44)
AST: 25 U/L (ref 15–41)
Albumin: 3.9 g/dL (ref 3.5–5.0)
Alkaline Phosphatase: 72 U/L (ref 38–126)
Anion gap: 9 (ref 5–15)
BUN: 17 mg/dL (ref 8–23)
CO2: 24 mmol/L (ref 22–32)
Calcium: 9.2 mg/dL (ref 8.9–10.3)
Chloride: 108 mmol/L (ref 98–111)
Creatinine: 1.09 mg/dL (ref 0.61–1.24)
GFR, Est AFR Am: >60 mL/min (ref >60)
GFR, Estimated: >60 mL/min (ref >60)
Glucose, Bld: 103 mg/dL — ABNORMAL HIGH (ref 70–99)
Potassium: 4.6 mmol/L (ref 3.5–5.1)
Sodium: 141 mmol/L (ref 135–145)
Total Bilirubin: 0.7 mg/dL (ref 0.3–1.2)
Total Protein: 7.2 g/dL (ref 6.5–8.1)

## 2019-10-07 LAB — CBC WITH DIFFERENTIAL (CANCER CENTER ONLY)
Abs Immature Granulocytes: 0.21 10*3/uL — ABNORMAL HIGH (ref 0.00–0.07)
Basophils Absolute: 0.2 10*3/uL — ABNORMAL HIGH (ref 0.0–0.1)
Basophils Relative: 1 %
Eosinophils Absolute: 1.4 10*3/uL — ABNORMAL HIGH (ref 0.0–0.5)
Eosinophils Relative: 9 %
HCT: 29.3 % — ABNORMAL LOW (ref 39.0–52.0)
Hemoglobin: 9.7 g/dL — ABNORMAL LOW (ref 13.0–17.0)
Immature Granulocytes: 1 %
Lymphocytes Relative: 13 %
Lymphs Abs: 2 10*3/uL (ref 0.7–4.0)
MCH: 29.6 pg (ref 26.0–34.0)
MCHC: 33.1 g/dL (ref 30.0–36.0)
MCV: 89.3 fL (ref 80.0–100.0)
Monocytes Absolute: 1.4 10*3/uL — ABNORMAL HIGH (ref 0.1–1.0)
Monocytes Relative: 10 %
Neutro Abs: 10 10*3/uL — ABNORMAL HIGH (ref 1.7–7.7)
Neutrophils Relative %: 66 %
Platelet Count: 291 10*3/uL (ref 150–400)
RBC: 3.28 MIL/uL — ABNORMAL LOW (ref 4.22–5.81)
RDW: 31.5 % — ABNORMAL HIGH (ref 11.5–15.5)
WBC Count: 15.2 10*3/uL — ABNORMAL HIGH (ref 4.0–10.5)
nRBC: 0.3 % — ABNORMAL HIGH (ref 0.0–0.2)

## 2019-10-07 MED ORDER — EPOETIN ALFA-EPBX 40000 UNIT/ML IJ SOLN
INTRAMUSCULAR | Status: AC
  Filled 2019-10-07: qty 1

## 2019-10-07 MED ORDER — EPOETIN ALFA-EPBX 40000 UNIT/ML IJ SOLN
40000.0000 [IU] | Freq: Once | INTRAMUSCULAR | Status: AC
  Administered 2019-10-07: 40000 [IU] via SUBCUTANEOUS

## 2019-10-07 NOTE — Patient Instructions (Signed)

## 2019-10-15 ENCOUNTER — Inpatient Hospital Stay: Payer: Medicare Other

## 2019-10-15 ENCOUNTER — Other Ambulatory Visit: Payer: Self-pay

## 2019-10-15 ENCOUNTER — Inpatient Hospital Stay: Payer: Medicare Other | Attending: Internal Medicine

## 2019-10-15 VITALS — BP 138/77 | Temp 98.2°F | Resp 18

## 2019-10-15 DIAGNOSIS — D72829 Elevated white blood cell count, unspecified: Secondary | ICD-10-CM | POA: Insufficient documentation

## 2019-10-15 DIAGNOSIS — D473 Essential (hemorrhagic) thrombocythemia: Secondary | ICD-10-CM | POA: Diagnosis not present

## 2019-10-15 DIAGNOSIS — D469 Myelodysplastic syndrome, unspecified: Secondary | ICD-10-CM

## 2019-10-15 DIAGNOSIS — D461 Refractory anemia with ring sideroblasts: Secondary | ICD-10-CM | POA: Insufficient documentation

## 2019-10-15 DIAGNOSIS — Z8546 Personal history of malignant neoplasm of prostate: Secondary | ICD-10-CM | POA: Insufficient documentation

## 2019-10-15 DIAGNOSIS — Z79899 Other long term (current) drug therapy: Secondary | ICD-10-CM | POA: Diagnosis not present

## 2019-10-15 LAB — CBC WITH DIFFERENTIAL (CANCER CENTER ONLY)
Abs Immature Granulocytes: 0.08 10*3/uL — ABNORMAL HIGH (ref 0.00–0.07)
Basophils Absolute: 0.1 10*3/uL (ref 0.0–0.1)
Basophils Relative: 1 %
Eosinophils Absolute: 1.2 10*3/uL — ABNORMAL HIGH (ref 0.0–0.5)
Eosinophils Relative: 12 %
HCT: 31.4 % — ABNORMAL LOW (ref 39.0–52.0)
Hemoglobin: 9.9 g/dL — ABNORMAL LOW (ref 13.0–17.0)
Immature Granulocytes: 1 %
Lymphocytes Relative: 16 %
Lymphs Abs: 1.5 10*3/uL (ref 0.7–4.0)
MCH: 28.5 pg (ref 26.0–34.0)
MCHC: 31.5 g/dL (ref 30.0–36.0)
MCV: 90.5 fL (ref 80.0–100.0)
Monocytes Absolute: 0.9 10*3/uL (ref 0.1–1.0)
Monocytes Relative: 9 %
Neutro Abs: 5.8 10*3/uL (ref 1.7–7.7)
Neutrophils Relative %: 61 %
Platelet Count: 309 10*3/uL (ref 150–400)
RBC: 3.47 MIL/uL — ABNORMAL LOW (ref 4.22–5.81)
RDW: 31.8 % — ABNORMAL HIGH (ref 11.5–15.5)
WBC Count: 9.5 10*3/uL (ref 4.0–10.5)
nRBC: 0 % (ref 0.0–0.2)

## 2019-10-15 LAB — CMP (CANCER CENTER ONLY)
ALT: 47 U/L — ABNORMAL HIGH (ref 0–44)
AST: 26 U/L (ref 15–41)
Albumin: 3.9 g/dL (ref 3.5–5.0)
Alkaline Phosphatase: 77 U/L (ref 38–126)
Anion gap: 7 (ref 5–15)
BUN: 18 mg/dL (ref 8–23)
CO2: 22 mmol/L (ref 22–32)
Calcium: 8.9 mg/dL (ref 8.9–10.3)
Chloride: 110 mmol/L (ref 98–111)
Creatinine: 1.17 mg/dL (ref 0.61–1.24)
GFR, Est AFR Am: >60 mL/min (ref >60)
GFR, Estimated: >60 mL/min (ref >60)
Glucose, Bld: 96 mg/dL (ref 70–99)
Potassium: 4.4 mmol/L (ref 3.5–5.1)
Sodium: 139 mmol/L (ref 135–145)
Total Bilirubin: 1.1 mg/dL (ref 0.3–1.2)
Total Protein: 7.5 g/dL (ref 6.5–8.1)

## 2019-10-15 MED ORDER — EPOETIN ALFA-EPBX 40000 UNIT/ML IJ SOLN
40000.0000 [IU] | Freq: Once | INTRAMUSCULAR | Status: AC
  Administered 2019-10-15: 40000 [IU] via SUBCUTANEOUS

## 2019-10-15 MED ORDER — EPOETIN ALFA-EPBX 40000 UNIT/ML IJ SOLN
INTRAMUSCULAR | Status: AC
  Filled 2019-10-15: qty 1

## 2019-10-15 NOTE — Patient Instructions (Signed)

## 2019-10-21 ENCOUNTER — Other Ambulatory Visit: Payer: Self-pay

## 2019-10-21 ENCOUNTER — Inpatient Hospital Stay: Payer: Medicare Other

## 2019-10-21 VITALS — BP 132/87 | HR 77 | Temp 98.1°F | Resp 16

## 2019-10-21 DIAGNOSIS — D469 Myelodysplastic syndrome, unspecified: Secondary | ICD-10-CM

## 2019-10-21 DIAGNOSIS — D461 Refractory anemia with ring sideroblasts: Secondary | ICD-10-CM | POA: Diagnosis not present

## 2019-10-21 LAB — CMP (CANCER CENTER ONLY)
ALT: 50 U/L — ABNORMAL HIGH (ref 0–44)
AST: 28 U/L (ref 15–41)
Albumin: 3.9 g/dL (ref 3.5–5.0)
Alkaline Phosphatase: 76 U/L (ref 38–126)
Anion gap: 7 (ref 5–15)
BUN: 25 mg/dL — ABNORMAL HIGH (ref 8–23)
CO2: 23 mmol/L (ref 22–32)
Calcium: 9.6 mg/dL (ref 8.9–10.3)
Chloride: 111 mmol/L (ref 98–111)
Creatinine: 1.16 mg/dL (ref 0.61–1.24)
GFR, Est AFR Am: >60 mL/min (ref >60)
GFR, Estimated: >60 mL/min (ref >60)
Glucose, Bld: 108 mg/dL — ABNORMAL HIGH (ref 70–99)
Potassium: 5.1 mmol/L (ref 3.5–5.1)
Sodium: 141 mmol/L (ref 135–145)
Total Bilirubin: 0.9 mg/dL (ref 0.3–1.2)
Total Protein: 7.6 g/dL (ref 6.5–8.1)

## 2019-10-21 LAB — CBC WITH DIFFERENTIAL (CANCER CENTER ONLY)
Abs Immature Granulocytes: 0.07 10*3/uL (ref 0.00–0.07)
Basophils Absolute: 0.2 10*3/uL — ABNORMAL HIGH (ref 0.0–0.1)
Basophils Relative: 3 %
Eosinophils Absolute: 0.5 10*3/uL (ref 0.0–0.5)
Eosinophils Relative: 6 %
HCT: 33.1 % — ABNORMAL LOW (ref 39.0–52.0)
Hemoglobin: 10.4 g/dL — ABNORMAL LOW (ref 13.0–17.0)
Immature Granulocytes: 1 %
Lymphocytes Relative: 21 %
Lymphs Abs: 1.7 10*3/uL (ref 0.7–4.0)
MCH: 28.6 pg (ref 26.0–34.0)
MCHC: 31.4 g/dL (ref 30.0–36.0)
MCV: 90.9 fL (ref 80.0–100.0)
Monocytes Absolute: 0.7 10*3/uL (ref 0.1–1.0)
Monocytes Relative: 8 %
Neutro Abs: 5.1 10*3/uL (ref 1.7–7.7)
Neutrophils Relative %: 61 %
Platelet Count: 261 10*3/uL (ref 150–400)
RBC: 3.64 MIL/uL — ABNORMAL LOW (ref 4.22–5.81)
RDW: 32.2 % — ABNORMAL HIGH (ref 11.5–15.5)
WBC Count: 8.4 10*3/uL (ref 4.0–10.5)
nRBC: 0.4 % — ABNORMAL HIGH (ref 0.0–0.2)

## 2019-10-21 MED ORDER — EPOETIN ALFA-EPBX 40000 UNIT/ML IJ SOLN
INTRAMUSCULAR | Status: AC
  Filled 2019-10-21: qty 1

## 2019-10-21 MED ORDER — EPOETIN ALFA-EPBX 40000 UNIT/ML IJ SOLN
40000.0000 [IU] | Freq: Once | INTRAMUSCULAR | Status: AC
  Administered 2019-10-21: 40000 [IU] via SUBCUTANEOUS

## 2019-10-21 NOTE — Patient Instructions (Signed)

## 2019-10-22 ENCOUNTER — Other Ambulatory Visit: Payer: Self-pay | Admitting: Neurology

## 2019-10-28 ENCOUNTER — Other Ambulatory Visit: Payer: Self-pay

## 2019-10-28 ENCOUNTER — Inpatient Hospital Stay: Payer: Medicare Other

## 2019-10-28 DIAGNOSIS — D461 Refractory anemia with ring sideroblasts: Secondary | ICD-10-CM | POA: Diagnosis not present

## 2019-10-28 DIAGNOSIS — D469 Myelodysplastic syndrome, unspecified: Secondary | ICD-10-CM

## 2019-10-28 LAB — CMP (CANCER CENTER ONLY)
ALT: 28 U/L (ref 0–44)
AST: 22 U/L (ref 15–41)
Albumin: 3.9 g/dL (ref 3.5–5.0)
Alkaline Phosphatase: 67 U/L (ref 38–126)
Anion gap: 8 (ref 5–15)
BUN: 19 mg/dL (ref 8–23)
CO2: 25 mmol/L (ref 22–32)
Calcium: 9.7 mg/dL (ref 8.9–10.3)
Chloride: 109 mmol/L (ref 98–111)
Creatinine: 1.16 mg/dL (ref 0.61–1.24)
GFR, Est AFR Am: >60 mL/min (ref >60)
GFR, Estimated: >60 mL/min (ref >60)
Glucose, Bld: 102 mg/dL — ABNORMAL HIGH (ref 70–99)
Potassium: 4.7 mmol/L (ref 3.5–5.1)
Sodium: 142 mmol/L (ref 135–145)
Total Bilirubin: 1 mg/dL (ref 0.3–1.2)
Total Protein: 7.4 g/dL (ref 6.5–8.1)

## 2019-10-28 LAB — CBC WITH DIFFERENTIAL (CANCER CENTER ONLY)
Abs Immature Granulocytes: 0.21 10*3/uL — ABNORMAL HIGH (ref 0.00–0.07)
Basophils Absolute: 0.2 10*3/uL — ABNORMAL HIGH (ref 0.0–0.1)
Basophils Relative: 2 %
Eosinophils Absolute: 1 10*3/uL — ABNORMAL HIGH (ref 0.0–0.5)
Eosinophils Relative: 10 %
HCT: 35.5 % — ABNORMAL LOW (ref 39.0–52.0)
Hemoglobin: 11.1 g/dL — ABNORMAL LOW (ref 13.0–17.0)
Immature Granulocytes: 2 %
Lymphocytes Relative: 19 %
Lymphs Abs: 1.8 10*3/uL (ref 0.7–4.0)
MCH: 28.8 pg (ref 26.0–34.0)
MCHC: 31.3 g/dL (ref 30.0–36.0)
MCV: 92.2 fL (ref 80.0–100.0)
Monocytes Absolute: 0.7 10*3/uL (ref 0.1–1.0)
Monocytes Relative: 7 %
Neutro Abs: 5.6 10*3/uL (ref 1.7–7.7)
Neutrophils Relative %: 60 %
Platelet Count: 273 10*3/uL (ref 150–400)
RBC: 3.85 MIL/uL — ABNORMAL LOW (ref 4.22–5.81)
RDW: 31.8 % — ABNORMAL HIGH (ref 11.5–15.5)
WBC Count: 9.5 10*3/uL (ref 4.0–10.5)
nRBC: 0.4 % — ABNORMAL HIGH (ref 0.0–0.2)

## 2019-10-28 NOTE — Progress Notes (Signed)
Hgb 11.1 today no injection needed at the time gave copy of labs.

## 2019-11-04 ENCOUNTER — Inpatient Hospital Stay: Payer: Medicare Other

## 2019-11-04 ENCOUNTER — Other Ambulatory Visit: Payer: Self-pay

## 2019-11-04 DIAGNOSIS — D461 Refractory anemia with ring sideroblasts: Secondary | ICD-10-CM | POA: Diagnosis not present

## 2019-11-04 DIAGNOSIS — D469 Myelodysplastic syndrome, unspecified: Secondary | ICD-10-CM

## 2019-11-04 LAB — CBC WITH DIFFERENTIAL (CANCER CENTER ONLY)
Abs Immature Granulocytes: 0.03 10*3/uL (ref 0.00–0.07)
Basophils Absolute: 0.2 10*3/uL — ABNORMAL HIGH (ref 0.0–0.1)
Basophils Relative: 2 %
Eosinophils Absolute: 1.3 10*3/uL — ABNORMAL HIGH (ref 0.0–0.5)
Eosinophils Relative: 14 %
HCT: 35.3 % — ABNORMAL LOW (ref 39.0–52.0)
Hemoglobin: 11.1 g/dL — ABNORMAL LOW (ref 13.0–17.0)
Immature Granulocytes: 0 %
Lymphocytes Relative: 20 %
Lymphs Abs: 1.8 10*3/uL (ref 0.7–4.0)
MCH: 28 pg (ref 26.0–34.0)
MCHC: 31.4 g/dL (ref 30.0–36.0)
MCV: 88.9 fL (ref 80.0–100.0)
Monocytes Absolute: 0.8 10*3/uL (ref 0.1–1.0)
Monocytes Relative: 9 %
Neutro Abs: 4.9 10*3/uL (ref 1.7–7.7)
Neutrophils Relative %: 55 %
Platelet Count: 200 10*3/uL (ref 150–400)
RBC: 3.97 MIL/uL — ABNORMAL LOW (ref 4.22–5.81)
RDW: 31.8 % — ABNORMAL HIGH (ref 11.5–15.5)
WBC Count: 9 10*3/uL (ref 4.0–10.5)
nRBC: 0 % (ref 0.0–0.2)

## 2019-11-04 LAB — CMP (CANCER CENTER ONLY)
ALT: 40 U/L (ref 0–44)
AST: 20 U/L (ref 15–41)
Albumin: 3.9 g/dL (ref 3.5–5.0)
Alkaline Phosphatase: 65 U/L (ref 38–126)
Anion gap: 7 (ref 5–15)
BUN: 18 mg/dL (ref 8–23)
CO2: 25 mmol/L (ref 22–32)
Calcium: 9.5 mg/dL (ref 8.9–10.3)
Chloride: 109 mmol/L (ref 98–111)
Creatinine: 1.26 mg/dL — ABNORMAL HIGH (ref 0.61–1.24)
GFR, Est AFR Am: >60 mL/min (ref >60)
GFR, Estimated: 56 mL/min — ABNORMAL LOW (ref >60)
Glucose, Bld: 105 mg/dL — ABNORMAL HIGH (ref 70–99)
Potassium: 4.4 mmol/L (ref 3.5–5.1)
Sodium: 141 mmol/L (ref 135–145)
Total Bilirubin: 0.9 mg/dL (ref 0.3–1.2)
Total Protein: 7.2 g/dL (ref 6.5–8.1)

## 2019-11-04 NOTE — Progress Notes (Signed)
Patient HGB is 11.1 today no injection needed at this time per Dr. Kayleen Memos. Copy of labs given to patient.

## 2019-11-11 ENCOUNTER — Other Ambulatory Visit: Payer: Self-pay

## 2019-11-11 ENCOUNTER — Inpatient Hospital Stay: Payer: Medicare Other

## 2019-11-11 ENCOUNTER — Inpatient Hospital Stay: Payer: Medicare Other | Attending: Internal Medicine

## 2019-11-11 VITALS — BP 139/66 | HR 78 | Resp 18

## 2019-11-11 DIAGNOSIS — Z79899 Other long term (current) drug therapy: Secondary | ICD-10-CM | POA: Diagnosis not present

## 2019-11-11 DIAGNOSIS — D461 Refractory anemia with ring sideroblasts: Secondary | ICD-10-CM | POA: Diagnosis not present

## 2019-11-11 DIAGNOSIS — Z8546 Personal history of malignant neoplasm of prostate: Secondary | ICD-10-CM | POA: Diagnosis not present

## 2019-11-11 DIAGNOSIS — D469 Myelodysplastic syndrome, unspecified: Secondary | ICD-10-CM

## 2019-11-11 DIAGNOSIS — D693 Immune thrombocytopenic purpura: Secondary | ICD-10-CM | POA: Insufficient documentation

## 2019-11-11 DIAGNOSIS — D72829 Elevated white blood cell count, unspecified: Secondary | ICD-10-CM | POA: Insufficient documentation

## 2019-11-11 DIAGNOSIS — Z8719 Personal history of other diseases of the digestive system: Secondary | ICD-10-CM | POA: Insufficient documentation

## 2019-11-11 LAB — CBC WITH DIFFERENTIAL (CANCER CENTER ONLY)
Abs Immature Granulocytes: 0.02 10*3/uL (ref 0.00–0.07)
Basophils Absolute: 0.1 10*3/uL (ref 0.0–0.1)
Basophils Relative: 2 %
Eosinophils Absolute: 1.2 10*3/uL — ABNORMAL HIGH (ref 0.0–0.5)
Eosinophils Relative: 18 %
HCT: 30.5 % — ABNORMAL LOW (ref 39.0–52.0)
Hemoglobin: 9.7 g/dL — ABNORMAL LOW (ref 13.0–17.0)
Immature Granulocytes: 0 %
Lymphocytes Relative: 21 %
Lymphs Abs: 1.4 10*3/uL (ref 0.7–4.0)
MCH: 27.7 pg (ref 26.0–34.0)
MCHC: 31.8 g/dL (ref 30.0–36.0)
MCV: 87.1 fL (ref 80.0–100.0)
Monocytes Absolute: 0.6 10*3/uL (ref 0.1–1.0)
Monocytes Relative: 10 %
Neutro Abs: 3.2 10*3/uL (ref 1.7–7.7)
Neutrophils Relative %: 49 %
Platelet Count: 245 10*3/uL (ref 150–400)
RBC: 3.5 MIL/uL — ABNORMAL LOW (ref 4.22–5.81)
RDW: 32.2 % — ABNORMAL HIGH (ref 11.5–15.5)
WBC Count: 6.6 10*3/uL (ref 4.0–10.5)
nRBC: 0 % (ref 0.0–0.2)

## 2019-11-11 LAB — CMP (CANCER CENTER ONLY)
ALT: 41 U/L (ref 0–44)
AST: 20 U/L (ref 15–41)
Albumin: 3.9 g/dL (ref 3.5–5.0)
Alkaline Phosphatase: 61 U/L (ref 38–126)
Anion gap: 6 (ref 5–15)
BUN: 16 mg/dL (ref 8–23)
CO2: 26 mmol/L (ref 22–32)
Calcium: 9.3 mg/dL (ref 8.9–10.3)
Chloride: 110 mmol/L (ref 98–111)
Creatinine: 1.03 mg/dL (ref 0.61–1.24)
GFR, Est AFR Am: >60 mL/min (ref >60)
GFR, Estimated: >60 mL/min (ref >60)
Glucose, Bld: 97 mg/dL (ref 70–99)
Potassium: 4.3 mmol/L (ref 3.5–5.1)
Sodium: 142 mmol/L (ref 135–145)
Total Bilirubin: 1 mg/dL (ref 0.3–1.2)
Total Protein: 7 g/dL (ref 6.5–8.1)

## 2019-11-11 MED ORDER — EPOETIN ALFA-EPBX 40000 UNIT/ML IJ SOLN
INTRAMUSCULAR | Status: AC
  Filled 2019-11-11: qty 1

## 2019-11-11 MED ORDER — EPOETIN ALFA-EPBX 40000 UNIT/ML IJ SOLN
40000.0000 [IU] | Freq: Once | INTRAMUSCULAR | Status: AC
  Administered 2019-11-11: 40000 [IU] via SUBCUTANEOUS

## 2019-11-11 NOTE — Patient Instructions (Signed)

## 2019-11-18 ENCOUNTER — Inpatient Hospital Stay: Payer: Medicare Other

## 2019-11-18 ENCOUNTER — Other Ambulatory Visit: Payer: Self-pay

## 2019-11-18 VITALS — BP 146/71 | HR 79 | Resp 18

## 2019-11-18 DIAGNOSIS — D469 Myelodysplastic syndrome, unspecified: Secondary | ICD-10-CM

## 2019-11-18 DIAGNOSIS — D461 Refractory anemia with ring sideroblasts: Secondary | ICD-10-CM | POA: Diagnosis not present

## 2019-11-18 LAB — CMP (CANCER CENTER ONLY)
ALT: 27 U/L (ref 0–44)
AST: 19 U/L (ref 15–41)
Albumin: 3.9 g/dL (ref 3.5–5.0)
Alkaline Phosphatase: 57 U/L (ref 38–126)
Anion gap: 5 (ref 5–15)
BUN: 18 mg/dL (ref 8–23)
CO2: 23 mmol/L (ref 22–32)
Calcium: 9.5 mg/dL (ref 8.9–10.3)
Chloride: 111 mmol/L (ref 98–111)
Creatinine: 1.04 mg/dL (ref 0.61–1.24)
GFR, Est AFR Am: >60 mL/min (ref >60)
GFR, Estimated: >60 mL/min (ref >60)
Glucose, Bld: 95 mg/dL (ref 70–99)
Potassium: 4.3 mmol/L (ref 3.5–5.1)
Sodium: 139 mmol/L (ref 135–145)
Total Bilirubin: 1 mg/dL (ref 0.3–1.2)
Total Protein: 7.1 g/dL (ref 6.5–8.1)

## 2019-11-18 LAB — CBC WITH DIFFERENTIAL (CANCER CENTER ONLY)
Abs Immature Granulocytes: 0.12 10*3/uL — ABNORMAL HIGH (ref 0.00–0.07)
Basophils Absolute: 0.2 10*3/uL — ABNORMAL HIGH (ref 0.0–0.1)
Basophils Relative: 2 %
Eosinophils Absolute: 0.3 10*3/uL (ref 0.0–0.5)
Eosinophils Relative: 4 %
HCT: 29.4 % — ABNORMAL LOW (ref 39.0–52.0)
Hemoglobin: 9.5 g/dL — ABNORMAL LOW (ref 13.0–17.0)
Immature Granulocytes: 2 %
Lymphocytes Relative: 20 %
Lymphs Abs: 1.7 10*3/uL (ref 0.7–4.0)
MCH: 28.5 pg (ref 26.0–34.0)
MCHC: 32.3 g/dL (ref 30.0–36.0)
MCV: 88.3 fL (ref 80.0–100.0)
Monocytes Absolute: 0.8 10*3/uL (ref 0.1–1.0)
Monocytes Relative: 10 %
Neutro Abs: 5.1 10*3/uL (ref 1.7–7.7)
Neutrophils Relative %: 62 %
Platelet Count: 399 10*3/uL (ref 150–400)
RBC: 3.33 MIL/uL — ABNORMAL LOW (ref 4.22–5.81)
RDW: 31.6 % — ABNORMAL HIGH (ref 11.5–15.5)
WBC Count: 8.2 10*3/uL (ref 4.0–10.5)
nRBC: 0.5 % — ABNORMAL HIGH (ref 0.0–0.2)

## 2019-11-18 MED ORDER — EPOETIN ALFA-EPBX 40000 UNIT/ML IJ SOLN
40000.0000 [IU] | Freq: Once | INTRAMUSCULAR | Status: AC
  Administered 2019-11-18: 40000 [IU] via SUBCUTANEOUS

## 2019-11-18 MED ORDER — EPOETIN ALFA-EPBX 40000 UNIT/ML IJ SOLN
INTRAMUSCULAR | Status: AC
  Filled 2019-11-18: qty 1

## 2019-11-18 NOTE — Patient Instructions (Signed)

## 2019-11-25 ENCOUNTER — Inpatient Hospital Stay: Payer: Medicare Other

## 2019-11-25 ENCOUNTER — Other Ambulatory Visit: Payer: Self-pay

## 2019-11-25 DIAGNOSIS — D469 Myelodysplastic syndrome, unspecified: Secondary | ICD-10-CM

## 2019-11-25 DIAGNOSIS — D461 Refractory anemia with ring sideroblasts: Secondary | ICD-10-CM | POA: Diagnosis not present

## 2019-11-25 LAB — CBC WITH DIFFERENTIAL (CANCER CENTER ONLY)
Abs Immature Granulocytes: 0.37 10*3/uL — ABNORMAL HIGH (ref 0.00–0.07)
Basophils Absolute: 0.2 10*3/uL — ABNORMAL HIGH (ref 0.0–0.1)
Basophils Relative: 2 %
Eosinophils Absolute: 0.6 10*3/uL — ABNORMAL HIGH (ref 0.0–0.5)
Eosinophils Relative: 8 %
HCT: 35.9 % — ABNORMAL LOW (ref 39.0–52.0)
Hemoglobin: 11.3 g/dL — ABNORMAL LOW (ref 13.0–17.0)
Immature Granulocytes: 5 %
Lymphocytes Relative: 20 %
Lymphs Abs: 1.6 10*3/uL (ref 0.7–4.0)
MCH: 28.5 pg (ref 26.0–34.0)
MCHC: 31.5 g/dL (ref 30.0–36.0)
MCV: 90.7 fL (ref 80.0–100.0)
Monocytes Absolute: 0.6 10*3/uL (ref 0.1–1.0)
Monocytes Relative: 7 %
Neutro Abs: 4.8 10*3/uL (ref 1.7–7.7)
Neutrophils Relative %: 58 %
Platelet Count: 275 10*3/uL (ref 150–400)
RBC: 3.96 MIL/uL — ABNORMAL LOW (ref 4.22–5.81)
RDW: 33.4 % — ABNORMAL HIGH (ref 11.5–15.5)
WBC Count: 8.1 10*3/uL (ref 4.0–10.5)
nRBC: 0.4 % — ABNORMAL HIGH (ref 0.0–0.2)

## 2019-11-25 LAB — CMP (CANCER CENTER ONLY)
ALT: 28 U/L (ref 0–44)
AST: 28 U/L (ref 15–41)
Albumin: 4.1 g/dL (ref 3.5–5.0)
Alkaline Phosphatase: 59 U/L (ref 38–126)
Anion gap: 8 (ref 5–15)
BUN: 14 mg/dL (ref 8–23)
CO2: 22 mmol/L (ref 22–32)
Calcium: 9.8 mg/dL (ref 8.9–10.3)
Chloride: 111 mmol/L (ref 98–111)
Creatinine: 1.12 mg/dL (ref 0.61–1.24)
GFR, Est AFR Am: >60 mL/min (ref >60)
GFR, Estimated: >60 mL/min (ref >60)
Glucose, Bld: 101 mg/dL — ABNORMAL HIGH (ref 70–99)
Potassium: 4.5 mmol/L (ref 3.5–5.1)
Sodium: 141 mmol/L (ref 135–145)
Total Bilirubin: 1.2 mg/dL (ref 0.3–1.2)
Total Protein: 7.4 g/dL (ref 6.5–8.1)

## 2019-11-25 NOTE — Progress Notes (Signed)
No injection today labs were 11.3 parameters met. Gave a copy of labs.

## 2019-12-02 ENCOUNTER — Inpatient Hospital Stay: Payer: Medicare Other

## 2019-12-02 ENCOUNTER — Other Ambulatory Visit: Payer: Self-pay

## 2019-12-02 VITALS — BP 144/70 | HR 78 | Resp 18

## 2019-12-02 DIAGNOSIS — D461 Refractory anemia with ring sideroblasts: Secondary | ICD-10-CM | POA: Diagnosis not present

## 2019-12-02 DIAGNOSIS — D469 Myelodysplastic syndrome, unspecified: Secondary | ICD-10-CM

## 2019-12-02 LAB — CMP (CANCER CENTER ONLY)
ALT: 31 U/L (ref 0–44)
AST: 19 U/L (ref 15–41)
Albumin: 4.1 g/dL (ref 3.5–5.0)
Alkaline Phosphatase: 61 U/L (ref 38–126)
Anion gap: 8 (ref 5–15)
BUN: 18 mg/dL (ref 8–23)
CO2: 23 mmol/L (ref 22–32)
Calcium: 9.5 mg/dL (ref 8.9–10.3)
Chloride: 110 mmol/L (ref 98–111)
Creatinine: 1.05 mg/dL (ref 0.61–1.24)
GFR, Est AFR Am: >60 mL/min (ref >60)
GFR, Estimated: >60 mL/min (ref >60)
Glucose, Bld: 112 mg/dL — ABNORMAL HIGH (ref 70–99)
Potassium: 4.8 mmol/L (ref 3.5–5.1)
Sodium: 141 mmol/L (ref 135–145)
Total Bilirubin: 1 mg/dL (ref 0.3–1.2)
Total Protein: 7.2 g/dL (ref 6.5–8.1)

## 2019-12-02 LAB — CBC WITH DIFFERENTIAL (CANCER CENTER ONLY)
Abs Immature Granulocytes: 0.03 10*3/uL (ref 0.00–0.07)
Basophils Absolute: 0.2 10*3/uL — ABNORMAL HIGH (ref 0.0–0.1)
Basophils Relative: 2 %
Eosinophils Absolute: 1 10*3/uL — ABNORMAL HIGH (ref 0.0–0.5)
Eosinophils Relative: 13 %
HCT: 33.9 % — ABNORMAL LOW (ref 39.0–52.0)
Hemoglobin: 10.7 g/dL — ABNORMAL LOW (ref 13.0–17.0)
Immature Granulocytes: 0 %
Lymphocytes Relative: 18 %
Lymphs Abs: 1.4 10*3/uL (ref 0.7–4.0)
MCH: 28.2 pg (ref 26.0–34.0)
MCHC: 31.6 g/dL (ref 30.0–36.0)
MCV: 89.2 fL (ref 80.0–100.0)
Monocytes Absolute: 0.9 10*3/uL (ref 0.1–1.0)
Monocytes Relative: 11 %
Neutro Abs: 4.2 10*3/uL (ref 1.7–7.7)
Neutrophils Relative %: 56 %
Platelet Count: 163 10*3/uL (ref 150–400)
RBC: 3.8 MIL/uL — ABNORMAL LOW (ref 4.22–5.81)
RDW: 32.7 % — ABNORMAL HIGH (ref 11.5–15.5)
WBC Count: 7.5 10*3/uL (ref 4.0–10.5)
nRBC: 0 % (ref 0.0–0.2)

## 2019-12-02 MED ORDER — EPOETIN ALFA-EPBX 40000 UNIT/ML IJ SOLN
40000.0000 [IU] | Freq: Once | INTRAMUSCULAR | Status: AC
  Administered 2019-12-02: 40000 [IU] via SUBCUTANEOUS

## 2019-12-02 MED ORDER — EPOETIN ALFA-EPBX 40000 UNIT/ML IJ SOLN
INTRAMUSCULAR | Status: AC
  Filled 2019-12-02: qty 1

## 2019-12-02 NOTE — Patient Instructions (Signed)

## 2019-12-09 ENCOUNTER — Other Ambulatory Visit: Payer: Self-pay

## 2019-12-09 ENCOUNTER — Inpatient Hospital Stay: Payer: Medicare Other

## 2019-12-09 VITALS — BP 144/71 | HR 73 | Resp 18

## 2019-12-09 DIAGNOSIS — D461 Refractory anemia with ring sideroblasts: Secondary | ICD-10-CM | POA: Diagnosis not present

## 2019-12-09 DIAGNOSIS — D469 Myelodysplastic syndrome, unspecified: Secondary | ICD-10-CM

## 2019-12-09 LAB — CBC WITH DIFFERENTIAL (CANCER CENTER ONLY)
Abs Immature Granulocytes: 0.08 K/uL — ABNORMAL HIGH (ref 0.00–0.07)
Basophils Absolute: 0.3 K/uL — ABNORMAL HIGH (ref 0.0–0.1)
Basophils Relative: 3 %
Eosinophils Absolute: 1.4 K/uL — ABNORMAL HIGH (ref 0.0–0.5)
Eosinophils Relative: 15 %
HCT: 33 % — ABNORMAL LOW (ref 39.0–52.0)
Hemoglobin: 10.3 g/dL — ABNORMAL LOW (ref 13.0–17.0)
Immature Granulocytes: 1 %
Lymphocytes Relative: 18 %
Lymphs Abs: 1.7 K/uL (ref 0.7–4.0)
MCH: 28.3 pg (ref 26.0–34.0)
MCHC: 31.2 g/dL (ref 30.0–36.0)
MCV: 90.7 fL (ref 80.0–100.0)
Monocytes Absolute: 0.7 K/uL (ref 0.1–1.0)
Monocytes Relative: 8 %
Neutro Abs: 5.1 K/uL (ref 1.7–7.7)
Neutrophils Relative %: 55 %
Platelet Count: 252 K/uL (ref 150–400)
RBC: 3.64 MIL/uL — ABNORMAL LOW (ref 4.22–5.81)
RDW: 32 % — ABNORMAL HIGH (ref 11.5–15.5)
WBC Count: 9.2 K/uL (ref 4.0–10.5)
nRBC: 0.3 % — ABNORMAL HIGH (ref 0.0–0.2)

## 2019-12-09 LAB — CMP (CANCER CENTER ONLY)
ALT: 25 U/L (ref 0–44)
AST: 16 U/L (ref 15–41)
Albumin: 3.9 g/dL (ref 3.5–5.0)
Alkaline Phosphatase: 56 U/L (ref 38–126)
Anion gap: 5 (ref 5–15)
BUN: 13 mg/dL (ref 8–23)
CO2: 27 mmol/L (ref 22–32)
Calcium: 9.8 mg/dL (ref 8.9–10.3)
Chloride: 109 mmol/L (ref 98–111)
Creatinine: 1.08 mg/dL (ref 0.61–1.24)
GFR, Est AFR Am: >60 mL/min (ref >60)
GFR, Estimated: >60 mL/min (ref >60)
Glucose, Bld: 99 mg/dL (ref 70–99)
Potassium: 4.4 mmol/L (ref 3.5–5.1)
Sodium: 141 mmol/L (ref 135–145)
Total Bilirubin: 0.9 mg/dL (ref 0.3–1.2)
Total Protein: 7 g/dL (ref 6.5–8.1)

## 2019-12-09 MED ORDER — EPOETIN ALFA-EPBX 40000 UNIT/ML IJ SOLN
INTRAMUSCULAR | Status: AC
  Filled 2019-12-09: qty 1

## 2019-12-09 MED ORDER — EPOETIN ALFA-EPBX 40000 UNIT/ML IJ SOLN
40000.0000 [IU] | Freq: Once | INTRAMUSCULAR | Status: AC
  Administered 2019-12-09: 40000 [IU] via SUBCUTANEOUS

## 2019-12-09 NOTE — Patient Instructions (Signed)

## 2019-12-17 ENCOUNTER — Other Ambulatory Visit: Payer: Self-pay

## 2019-12-17 ENCOUNTER — Inpatient Hospital Stay: Payer: Medicare Other

## 2019-12-17 ENCOUNTER — Inpatient Hospital Stay: Payer: Medicare Other | Attending: Internal Medicine

## 2019-12-17 VITALS — BP 147/88 | HR 64 | Temp 97.9°F | Resp 18

## 2019-12-17 DIAGNOSIS — Z79899 Other long term (current) drug therapy: Secondary | ICD-10-CM | POA: Diagnosis not present

## 2019-12-17 DIAGNOSIS — D72829 Elevated white blood cell count, unspecified: Secondary | ICD-10-CM | POA: Diagnosis not present

## 2019-12-17 DIAGNOSIS — D473 Essential (hemorrhagic) thrombocythemia: Secondary | ICD-10-CM | POA: Diagnosis not present

## 2019-12-17 DIAGNOSIS — D461 Refractory anemia with ring sideroblasts: Secondary | ICD-10-CM | POA: Insufficient documentation

## 2019-12-17 DIAGNOSIS — D469 Myelodysplastic syndrome, unspecified: Secondary | ICD-10-CM

## 2019-12-17 LAB — CMP (CANCER CENTER ONLY)
ALT: 24 U/L (ref 0–44)
AST: 17 U/L (ref 15–41)
Albumin: 4.1 g/dL (ref 3.5–5.0)
Alkaline Phosphatase: 60 U/L (ref 38–126)
Anion gap: 2 — ABNORMAL LOW (ref 5–15)
BUN: 19 mg/dL (ref 8–23)
CO2: 27 mmol/L (ref 22–32)
Calcium: 9.2 mg/dL (ref 8.9–10.3)
Chloride: 112 mmol/L — ABNORMAL HIGH (ref 98–111)
Creatinine: 1.06 mg/dL (ref 0.61–1.24)
GFR, Est AFR Am: >60 mL/min (ref >60)
GFR, Estimated: >60 mL/min (ref >60)
Glucose, Bld: 101 mg/dL — ABNORMAL HIGH (ref 70–99)
Potassium: 4.9 mmol/L (ref 3.5–5.1)
Sodium: 141 mmol/L (ref 135–145)
Total Bilirubin: 1 mg/dL (ref 0.3–1.2)
Total Protein: 7.3 g/dL (ref 6.5–8.1)

## 2019-12-17 LAB — CBC WITH DIFFERENTIAL (CANCER CENTER ONLY)
Abs Immature Granulocytes: 0.1 10*3/uL — ABNORMAL HIGH (ref 0.00–0.07)
Basophils Absolute: 0.3 10*3/uL — ABNORMAL HIGH (ref 0.0–0.1)
Basophils Relative: 3 %
Eosinophils Absolute: 0.3 10*3/uL (ref 0.0–0.5)
Eosinophils Relative: 4 %
HCT: 32.4 % — ABNORMAL LOW (ref 39.0–52.0)
Hemoglobin: 10.4 g/dL — ABNORMAL LOW (ref 13.0–17.0)
Immature Granulocytes: 1 %
Lymphocytes Relative: 17 %
Lymphs Abs: 1.6 10*3/uL (ref 0.7–4.0)
MCH: 29 pg (ref 26.0–34.0)
MCHC: 32.1 g/dL (ref 30.0–36.0)
MCV: 90.3 fL (ref 80.0–100.0)
Monocytes Absolute: 0.9 10*3/uL (ref 0.1–1.0)
Monocytes Relative: 10 %
Neutro Abs: 6.1 10*3/uL (ref 1.7–7.7)
Neutrophils Relative %: 65 %
Platelet Count: 273 10*3/uL (ref 150–400)
RBC: 3.59 MIL/uL — ABNORMAL LOW (ref 4.22–5.81)
RDW: 32.6 % — ABNORMAL HIGH (ref 11.5–15.5)
WBC Count: 9.4 10*3/uL (ref 4.0–10.5)
nRBC: 0.3 % — ABNORMAL HIGH (ref 0.0–0.2)

## 2019-12-17 MED ORDER — EPOETIN ALFA-EPBX 40000 UNIT/ML IJ SOLN
INTRAMUSCULAR | Status: AC
  Filled 2019-12-17: qty 1

## 2019-12-17 MED ORDER — EPOETIN ALFA-EPBX 40000 UNIT/ML IJ SOLN
40000.0000 [IU] | Freq: Once | INTRAMUSCULAR | Status: AC
  Administered 2019-12-17: 40000 [IU] via SUBCUTANEOUS

## 2019-12-17 NOTE — Patient Instructions (Signed)

## 2019-12-23 ENCOUNTER — Other Ambulatory Visit: Payer: Medicare Other

## 2019-12-23 ENCOUNTER — Ambulatory Visit: Payer: Medicare Other

## 2019-12-24 ENCOUNTER — Other Ambulatory Visit: Payer: Self-pay

## 2019-12-24 ENCOUNTER — Inpatient Hospital Stay: Payer: Medicare Other

## 2019-12-24 VITALS — BP 135/79 | HR 75 | Temp 97.8°F | Resp 18

## 2019-12-24 DIAGNOSIS — D469 Myelodysplastic syndrome, unspecified: Secondary | ICD-10-CM

## 2019-12-24 DIAGNOSIS — D461 Refractory anemia with ring sideroblasts: Secondary | ICD-10-CM | POA: Diagnosis not present

## 2019-12-24 LAB — CBC WITH DIFFERENTIAL (CANCER CENTER ONLY)
Abs Immature Granulocytes: 0.27 10*3/uL — ABNORMAL HIGH (ref 0.00–0.07)
Basophils Absolute: 0.2 10*3/uL — ABNORMAL HIGH (ref 0.0–0.1)
Basophils Relative: 2 %
Eosinophils Absolute: 0.7 10*3/uL — ABNORMAL HIGH (ref 0.0–0.5)
Eosinophils Relative: 8 %
HCT: 35.6 % — ABNORMAL LOW (ref 39.0–52.0)
Hemoglobin: 11.3 g/dL — ABNORMAL LOW (ref 13.0–17.0)
Immature Granulocytes: 3 %
Lymphocytes Relative: 18 %
Lymphs Abs: 1.6 10*3/uL (ref 0.7–4.0)
MCH: 28.5 pg (ref 26.0–34.0)
MCHC: 31.7 g/dL (ref 30.0–36.0)
MCV: 89.9 fL (ref 80.0–100.0)
Monocytes Absolute: 0.6 10*3/uL (ref 0.1–1.0)
Monocytes Relative: 6 %
Neutro Abs: 5.4 10*3/uL (ref 1.7–7.7)
Neutrophils Relative %: 63 %
Platelet Count: 336 10*3/uL (ref 150–400)
RBC: 3.96 MIL/uL — ABNORMAL LOW (ref 4.22–5.81)
RDW: 32.7 % — ABNORMAL HIGH (ref 11.5–15.5)
WBC Count: 8.7 10*3/uL (ref 4.0–10.5)
nRBC: 0.3 % — ABNORMAL HIGH (ref 0.0–0.2)

## 2019-12-24 LAB — CMP (CANCER CENTER ONLY)
ALT: 24 U/L (ref 0–44)
AST: 19 U/L (ref 15–41)
Albumin: 4.1 g/dL (ref 3.5–5.0)
Alkaline Phosphatase: 57 U/L (ref 38–126)
Anion gap: 7 (ref 5–15)
BUN: 18 mg/dL (ref 8–23)
CO2: 24 mmol/L (ref 22–32)
Calcium: 9.4 mg/dL (ref 8.9–10.3)
Chloride: 109 mmol/L (ref 98–111)
Creatinine: 1.14 mg/dL (ref 0.61–1.24)
GFR, Est AFR Am: >60 mL/min (ref >60)
GFR, Estimated: >60 mL/min (ref >60)
Glucose, Bld: 117 mg/dL — ABNORMAL HIGH (ref 70–99)
Potassium: 4.3 mmol/L (ref 3.5–5.1)
Sodium: 140 mmol/L (ref 135–145)
Total Bilirubin: 1.2 mg/dL (ref 0.3–1.2)
Total Protein: 7.3 g/dL (ref 6.5–8.1)

## 2019-12-24 MED ORDER — EPOETIN ALFA-EPBX 40000 UNIT/ML IJ SOLN: 40000.0000 [IU] | Freq: Once | INTRAMUSCULAR | Status: DC

## 2019-12-24 NOTE — Progress Notes (Signed)
Retacrit held today for hemoglobin 11.3

## 2019-12-24 NOTE — Patient Instructions (Signed)

## 2019-12-30 ENCOUNTER — Other Ambulatory Visit: Payer: Self-pay

## 2019-12-30 ENCOUNTER — Inpatient Hospital Stay: Payer: Medicare Other

## 2019-12-30 DIAGNOSIS — D469 Myelodysplastic syndrome, unspecified: Secondary | ICD-10-CM

## 2019-12-30 DIAGNOSIS — D461 Refractory anemia with ring sideroblasts: Secondary | ICD-10-CM | POA: Diagnosis not present

## 2019-12-30 LAB — CMP (CANCER CENTER ONLY)
ALT: 31 U/L (ref 0–44)
AST: 17 U/L (ref 15–41)
Albumin: 4.1 g/dL (ref 3.5–5.0)
Alkaline Phosphatase: 68 U/L (ref 38–126)
Anion gap: 8 (ref 5–15)
BUN: 44 mg/dL — ABNORMAL HIGH (ref 8–23)
CO2: 23 mmol/L (ref 22–32)
Calcium: 9.1 mg/dL (ref 8.9–10.3)
Chloride: 109 mmol/L (ref 98–111)
Creatinine: 1.68 mg/dL — ABNORMAL HIGH (ref 0.61–1.24)
GFR, Est AFR Am: 46 mL/min — ABNORMAL LOW (ref >60)
GFR, Estimated: 39 mL/min — ABNORMAL LOW (ref >60)
Glucose, Bld: 103 mg/dL — ABNORMAL HIGH (ref 70–99)
Potassium: 5.1 mmol/L (ref 3.5–5.1)
Sodium: 140 mmol/L (ref 135–145)
Total Bilirubin: 0.9 mg/dL (ref 0.3–1.2)
Total Protein: 7.4 g/dL (ref 6.5–8.1)

## 2019-12-30 LAB — CBC WITH DIFFERENTIAL (CANCER CENTER ONLY)
Abs Immature Granulocytes: 0.06 10*3/uL (ref 0.00–0.07)
Basophils Absolute: 0.2 10*3/uL — ABNORMAL HIGH (ref 0.0–0.1)
Basophils Relative: 1 %
Eosinophils Absolute: 1.3 10*3/uL — ABNORMAL HIGH (ref 0.0–0.5)
Eosinophils Relative: 11 %
HCT: 35.2 % — ABNORMAL LOW (ref 39.0–52.0)
Hemoglobin: 11.2 g/dL — ABNORMAL LOW (ref 13.0–17.0)
Immature Granulocytes: 1 %
Lymphocytes Relative: 14 %
Lymphs Abs: 1.7 10*3/uL (ref 0.7–4.0)
MCH: 28.8 pg (ref 26.0–34.0)
MCHC: 31.8 g/dL (ref 30.0–36.0)
MCV: 90.5 fL (ref 80.0–100.0)
Monocytes Absolute: 1.3 10*3/uL — ABNORMAL HIGH (ref 0.1–1.0)
Monocytes Relative: 11 %
Neutro Abs: 7.4 10*3/uL (ref 1.7–7.7)
Neutrophils Relative %: 62 %
Platelet Count: 432 10*3/uL — ABNORMAL HIGH (ref 150–400)
RBC: 3.89 MIL/uL — ABNORMAL LOW (ref 4.22–5.81)
RDW: 33 % — ABNORMAL HIGH (ref 11.5–15.5)
WBC Count: 11.9 10*3/uL — ABNORMAL HIGH (ref 4.0–10.5)
nRBC: 0 % (ref 0.0–0.2)

## 2019-12-30 NOTE — Progress Notes (Signed)
Met parameters no injection today. Gave a copy of labs and made patient aware.

## 2020-01-06 ENCOUNTER — Other Ambulatory Visit: Payer: Self-pay

## 2020-01-06 ENCOUNTER — Inpatient Hospital Stay: Payer: Medicare Other

## 2020-01-06 VITALS — BP 135/68 | HR 76 | Resp 18

## 2020-01-06 DIAGNOSIS — D469 Myelodysplastic syndrome, unspecified: Secondary | ICD-10-CM

## 2020-01-06 DIAGNOSIS — D461 Refractory anemia with ring sideroblasts: Secondary | ICD-10-CM | POA: Diagnosis not present

## 2020-01-06 LAB — CBC WITH DIFFERENTIAL (CANCER CENTER ONLY)
Abs Immature Granulocytes: 0.03 10*3/uL (ref 0.00–0.07)
Basophils Absolute: 0.2 10*3/uL — ABNORMAL HIGH (ref 0.0–0.1)
Basophils Relative: 2 %
Eosinophils Absolute: 1.3 10*3/uL — ABNORMAL HIGH (ref 0.0–0.5)
Eosinophils Relative: 14 %
HCT: 31.5 % — ABNORMAL LOW (ref 39.0–52.0)
Hemoglobin: 10 g/dL — ABNORMAL LOW (ref 13.0–17.0)
Immature Granulocytes: 0 %
Lymphocytes Relative: 17 %
Lymphs Abs: 1.6 10*3/uL (ref 0.7–4.0)
MCH: 28.5 pg (ref 26.0–34.0)
MCHC: 31.7 g/dL (ref 30.0–36.0)
MCV: 89.7 fL (ref 80.0–100.0)
Monocytes Absolute: 0.9 10*3/uL (ref 0.1–1.0)
Monocytes Relative: 10 %
Neutro Abs: 5.1 10*3/uL (ref 1.7–7.7)
Neutrophils Relative %: 57 %
Platelet Count: 558 10*3/uL — ABNORMAL HIGH (ref 150–400)
RBC: 3.51 MIL/uL — ABNORMAL LOW (ref 4.22–5.81)
RDW: 33 % — ABNORMAL HIGH (ref 11.5–15.5)
WBC Count: 9.1 10*3/uL (ref 4.0–10.5)
nRBC: 0 % (ref 0.0–0.2)

## 2020-01-06 LAB — CMP (CANCER CENTER ONLY)
ALT: 33 U/L (ref 0–44)
AST: 18 U/L (ref 15–41)
Albumin: 3.9 g/dL (ref 3.5–5.0)
Alkaline Phosphatase: 57 U/L (ref 38–126)
Anion gap: 3 — ABNORMAL LOW (ref 5–15)
BUN: 27 mg/dL — ABNORMAL HIGH (ref 8–23)
CO2: 28 mmol/L (ref 22–32)
Calcium: 8.9 mg/dL (ref 8.9–10.3)
Chloride: 109 mmol/L (ref 98–111)
Creatinine: 1.27 mg/dL — ABNORMAL HIGH (ref 0.61–1.24)
GFR, Est AFR Am: >60 mL/min (ref >60)
GFR, Estimated: 55 mL/min — ABNORMAL LOW (ref >60)
Glucose, Bld: 126 mg/dL — ABNORMAL HIGH (ref 70–99)
Potassium: 4.7 mmol/L (ref 3.5–5.1)
Sodium: 140 mmol/L (ref 135–145)
Total Bilirubin: 0.7 mg/dL (ref 0.3–1.2)
Total Protein: 6.8 g/dL (ref 6.5–8.1)

## 2020-01-06 MED ORDER — EPOETIN ALFA-EPBX 40000 UNIT/ML IJ SOLN
40000.0000 [IU] | Freq: Once | INTRAMUSCULAR | Status: AC
  Administered 2020-01-06: 40000 [IU] via SUBCUTANEOUS

## 2020-01-06 MED ORDER — EPOETIN ALFA-EPBX 40000 UNIT/ML IJ SOLN
INTRAMUSCULAR | Status: AC
  Filled 2020-01-06: qty 1

## 2020-01-06 NOTE — Patient Instructions (Signed)

## 2020-01-13 ENCOUNTER — Inpatient Hospital Stay (HOSPITAL_BASED_OUTPATIENT_CLINIC_OR_DEPARTMENT_OTHER): Payer: Medicare Other | Admitting: Internal Medicine

## 2020-01-13 ENCOUNTER — Other Ambulatory Visit: Payer: Self-pay

## 2020-01-13 ENCOUNTER — Inpatient Hospital Stay: Payer: Medicare Other

## 2020-01-13 ENCOUNTER — Inpatient Hospital Stay: Payer: Medicare Other | Attending: Internal Medicine

## 2020-01-13 ENCOUNTER — Encounter: Payer: Self-pay | Admitting: Internal Medicine

## 2020-01-13 VITALS — BP 137/73 | HR 82 | Resp 18

## 2020-01-13 VITALS — BP 149/71 | HR 76 | Temp 97.0°F | Resp 20 | Wt 168.0 lb

## 2020-01-13 DIAGNOSIS — R5383 Other fatigue: Secondary | ICD-10-CM | POA: Insufficient documentation

## 2020-01-13 DIAGNOSIS — R202 Paresthesia of skin: Secondary | ICD-10-CM | POA: Diagnosis not present

## 2020-01-13 DIAGNOSIS — T451X5A Adverse effect of antineoplastic and immunosuppressive drugs, initial encounter: Secondary | ICD-10-CM

## 2020-01-13 DIAGNOSIS — D6481 Anemia due to antineoplastic chemotherapy: Secondary | ICD-10-CM | POA: Diagnosis not present

## 2020-01-13 DIAGNOSIS — D461 Refractory anemia with ring sideroblasts: Secondary | ICD-10-CM | POA: Insufficient documentation

## 2020-01-13 DIAGNOSIS — R531 Weakness: Secondary | ICD-10-CM | POA: Insufficient documentation

## 2020-01-13 DIAGNOSIS — D72829 Elevated white blood cell count, unspecified: Secondary | ICD-10-CM | POA: Insufficient documentation

## 2020-01-13 DIAGNOSIS — Z8719 Personal history of other diseases of the digestive system: Secondary | ICD-10-CM | POA: Insufficient documentation

## 2020-01-13 DIAGNOSIS — D75839 Thrombocytosis, unspecified: Secondary | ICD-10-CM | POA: Diagnosis not present

## 2020-01-13 DIAGNOSIS — D469 Myelodysplastic syndrome, unspecified: Secondary | ICD-10-CM

## 2020-01-13 DIAGNOSIS — D473 Essential (hemorrhagic) thrombocythemia: Secondary | ICD-10-CM | POA: Insufficient documentation

## 2020-01-13 DIAGNOSIS — Z8546 Personal history of malignant neoplasm of prostate: Secondary | ICD-10-CM | POA: Insufficient documentation

## 2020-01-13 DIAGNOSIS — Z79899 Other long term (current) drug therapy: Secondary | ICD-10-CM | POA: Diagnosis not present

## 2020-01-13 LAB — CMP (CANCER CENTER ONLY)
ALT: 38 U/L (ref 0–44)
AST: 21 U/L (ref 15–41)
Albumin: 4 g/dL (ref 3.5–5.0)
Alkaline Phosphatase: 69 U/L (ref 38–126)
Anion gap: 7 (ref 5–15)
BUN: 17 mg/dL (ref 8–23)
CO2: 27 mmol/L (ref 22–32)
Calcium: 9.3 mg/dL (ref 8.9–10.3)
Chloride: 109 mmol/L (ref 98–111)
Creatinine: 1.33 mg/dL — ABNORMAL HIGH (ref 0.61–1.24)
GFR, Est AFR Am: >60 mL/min (ref >60)
GFR, Estimated: 52 mL/min — ABNORMAL LOW (ref >60)
Glucose, Bld: 96 mg/dL (ref 70–99)
Potassium: 4.4 mmol/L (ref 3.5–5.1)
Sodium: 143 mmol/L (ref 135–145)
Total Bilirubin: 0.5 mg/dL (ref 0.3–1.2)
Total Protein: 7.1 g/dL (ref 6.5–8.1)

## 2020-01-13 LAB — CBC WITH DIFFERENTIAL (CANCER CENTER ONLY)
Abs Immature Granulocytes: 0.15 10*3/uL — ABNORMAL HIGH (ref 0.00–0.07)
Basophils Absolute: 0.4 10*3/uL — ABNORMAL HIGH (ref 0.0–0.1)
Basophils Relative: 3 %
Eosinophils Absolute: 1.5 10*3/uL — ABNORMAL HIGH (ref 0.0–0.5)
Eosinophils Relative: 11 %
HCT: 29.3 % — ABNORMAL LOW (ref 39.0–52.0)
Hemoglobin: 9.7 g/dL — ABNORMAL LOW (ref 13.0–17.0)
Immature Granulocytes: 1 %
Lymphocytes Relative: 16 %
Lymphs Abs: 2.1 10*3/uL (ref 0.7–4.0)
MCH: 29.1 pg (ref 26.0–34.0)
MCHC: 33.1 g/dL (ref 30.0–36.0)
MCV: 88 fL (ref 80.0–100.0)
Monocytes Absolute: 1.2 10*3/uL — ABNORMAL HIGH (ref 0.1–1.0)
Monocytes Relative: 9 %
Neutro Abs: 7.7 10*3/uL (ref 1.7–7.7)
Neutrophils Relative %: 60 %
Platelet Count: 1015 10*3/uL (ref 150–400)
RBC: 3.33 MIL/uL — ABNORMAL LOW (ref 4.22–5.81)
RDW: 32.8 % — ABNORMAL HIGH (ref 11.5–15.5)
WBC Count: 13 10*3/uL — ABNORMAL HIGH (ref 4.0–10.5)
nRBC: 0 % (ref 0.0–0.2)

## 2020-01-13 MED ORDER — EPOETIN ALFA-EPBX 40000 UNIT/ML IJ SOLN
40000.0000 [IU] | Freq: Once | INTRAMUSCULAR | Status: AC
  Administered 2020-01-13: 40000 [IU] via SUBCUTANEOUS

## 2020-01-13 MED ORDER — EPOETIN ALFA-EPBX 40000 UNIT/ML IJ SOLN
INTRAMUSCULAR | Status: AC
  Filled 2020-01-13: qty 1

## 2020-01-13 NOTE — Progress Notes (Signed)
[place]  Telephone:(336) [phone] Fax:(336) [phone]  OFFICE PROGRESS NOTE  [name], MD [address] [place]  [address]  DIAGNOSIS: 1.  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 Essential thrombocythemia with positive JAK2 mutation 3. Leukocytosis.   PRIOR THERAPY: Previous treatment with combination of Hydrea and anagrelide.  CURRENT THERAPY:  1) Revlimid 5 mg by mouth daily.  Started 03/25/2016.  Status post 24 cycles. 2) anagrelide 1 mg by mouth every other day. 3) Aranesp 300 mcg subcutaneously every 3 weeks.  This is switched to Procrit 400 mcg subcutaneously every 2 weeks starting April 13, 2017.  The frequency of the Procrit was changed to weekly schedule when he was in [place].  He is currently on Retacrit weekly as a biosimilar to Procrit.  INTERVAL HISTORY: Roy Johnson 74 y.o. male returns to the clinic today for follow-up visit after repeating blood work earlier today showed significant elevation of his platelets count.  The patient is feeling fine today with no concerning complaints except for fatigue and feeling of numbness in his head.  He also has lack of concentration.  He denied having any current chest pain, shortness of breath, cough or hemoptysis.  He denied having any fever or chills.  He has no nausea, vomiting, diarrhea or constipation.  He decided on his own and also his consultation with Dr. Evelene Croon to decrease the dose of anagrelide to 0.5 mg q. other day.  He is here today for evaluation after repeating blood work showed significant elevation of his platelets count.  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  MEDICATIONS:  Current Outpatient Medications  Medication Sig Dispense Refill   amLODipine (NORVASC) 5 MG tablet Take 5 mg by mouth daily.  2   amphetamine-dextroamphetamine (ADDERALL) 10 MG tablet Take by mouth.     anagrelide (AGRYLIN) 0.5 MG capsule Take 2 capsules (1 mg total) by mouth daily. 60 capsule 3   anagrelide (AGRYLIN) 1 MG capsule TAKE 1 CAPSULE(1 MG) BY MOUTH DAILY 90 capsule 0   aspirin EC 81 MG tablet Take 81 mg by mouth.     cetirizine (ZYRTEC) 10 MG tablet TAKE 1 TABLET(10 MG) BY MOUTH DAILY     epoetin alfa-epbx (RETACRIT) 76195 UNIT/ML injection Inject into the skin.     gabapentin (NEURONTIN) 100 MG capsule TAKE 1 CAPSULE BY MOUTH TWICE DAILY AND 2 AT NIGHT 120 capsule 3   hyoscyamine (LEVSIN SL) 0.125 MG SL tablet Place 1 tablet (0.125 mg total) under the tongue every 4 (four) hours as needed. 90 tablet 3   lenalidomide (REVLIMID) 5 MG capsule Take 5 mg by mouth daily.     Multiple Vitamin (MULTI-VITAMINS) TABS Take 1 tablet by mouth daily.     Polyethylene Glycol 3350 (PEG 3350) POWD nightly.     vitamin B-12 (CYANOCOBALAMIN) 1000 MCG tablet Take 1 tablet by mouth daily as needed.     No  current facility-administered medications for this visit.   Facility-Administered Medications Ordered in Other Visits  Medication Dose Route Frequency Provider Last Rate Last Admin   0.9 %  sodium chloride infusion  250 mL Intravenous Once Curt Bears, MD        SURGICAL HISTORY:  Past Surgical History:  Procedure Laterality Date   COLONOSCOPY     POLYPECTOMY     PROSTATECTOMY     TONSILLECTOMY      REVIEW OF SYSTEMS:  A comprehensive review of systems was negative except for: Constitutional: positive for fatigue Neurological: positive for paresthesia   PHYSICAL EXAMINATION: General appearance: alert,  cooperative, appears stated age, fatigued and no distress Head: Normocephalic, without obvious abnormality, atraumatic Neck: no adenopathy, no JVD, supple, symmetrical, trachea midline and thyroid not enlarged, symmetric, no tenderness/mass/nodules Lymph nodes: Cervical, supraclavicular, and axillary nodes normal. Resp: clear to auscultation bilaterally Back: symmetric, no curvature. ROM normal. No CVA tenderness. Cardio: regular rate and rhythm, S1, S2 normal, no murmur, click, rub or gallop GI: abnormal findings:  distended Extremities: extremities normal, atraumatic, no cyanosis or edema  ECOG PERFORMANCE STATUS: 1 - Symptomatic but completely ambulatory  Blood pressure (!) 149/71, pulse 76, temperature (!) 97 F (36.1 C), resp. rate 20, weight 168 lb 0.3 oz (76.2 kg), SpO2 99 %.  LABORATORY DATA:  Lab Results  Component Value Date   WBC 13.0 (H) 01/13/2020   HGB 9.7 (L) 01/13/2020   HCT 29.3 (L) 01/13/2020   MCV 88.0 01/13/2020   PLT 1,015 (HH) 01/13/2020      Chemistry      Component Value Date/Time   NA 143 01/13/2020 0929   NA 141 12/08/2016 0928   K 4.4 01/13/2020 0929   K 4.5 12/08/2016 0928   CL 109 01/13/2020 0929   CO2 27 01/13/2020 0929   CO2 27 12/08/2016 0928   BUN 17 01/13/2020 0929   BUN 14.1 12/08/2016 0928   CREATININE 1.33 (H) 01/13/2020 0929   CREATININE 0.9 12/08/2016 0928      Component Value Date/Time   CALCIUM 9.3 01/13/2020 0929   CALCIUM 9.0 12/08/2016 0928   ALKPHOS 69 01/13/2020 0929   ALKPHOS 87 12/08/2016 0928   AST 21 01/13/2020 0929   AST 25 12/08/2016 0928   ALT 38 01/13/2020 0929   ALT 41 12/08/2016 0928   BILITOT 0.5 01/13/2020 0929   BILITOT 0.84 12/08/2016 0928      RADIOGRAPHIC STUDIES:  ASSESSMENT AND PLAN:  This is a very pleasant 74 years old white male with essential thrombocythemia with positive JAK-2 mutation as well as myelodysplastic syndrome. He is currently on treatment with Revlimid 5 mg by mouth daily  according to the recommendation from [facility].  He is also on treatment with Aranesp every 3 weeks.  Aranesp was a change it to Procrit initially every 2 weeks and currently on weekly basis.  He mentioned that he is feeling much better on Procrit weekly  He has been on the weekly treatment with Procrit for the last 15 months.   He has been tolerating his treatment with Retacrit fairly well. Repeat CBC today showed significant elevation of his platelets count. I recommended for him to increase his dose of anagrelide to 1 mg p.o. daily. I will repeat his CBC and LDH next week and will make adjustment to his medication if needed. He will also continue his current treatment with Revlimid. The patient will continue his treatment with Retacrit weekly based on his hematocrit value.  He will come back for follow-up visit as previously scheduled.  All questions were answered. The patient knows to call the clinic with any problems, questions or concerns. We can certainly see the patient much sooner if necessary.   Disclaimer: This note was dictated with voice recognition software. Similar sounding words can inadvertently be transcribed and may not be corrected upon review.

## 2020-01-13 NOTE — Patient Instructions (Signed)

## 2020-01-20 ENCOUNTER — Other Ambulatory Visit: Payer: Self-pay

## 2020-01-20 ENCOUNTER — Inpatient Hospital Stay: Payer: Medicare Other

## 2020-01-20 VITALS — BP 145/61 | HR 76 | Temp 98.3°F | Resp 16

## 2020-01-20 DIAGNOSIS — D469 Myelodysplastic syndrome, unspecified: Secondary | ICD-10-CM

## 2020-01-20 DIAGNOSIS — D473 Essential (hemorrhagic) thrombocythemia: Secondary | ICD-10-CM

## 2020-01-20 DIAGNOSIS — D461 Refractory anemia with ring sideroblasts: Secondary | ICD-10-CM | POA: Diagnosis not present

## 2020-01-20 LAB — CBC WITH DIFFERENTIAL (CANCER CENTER ONLY)
Abs Immature Granulocytes: 0.1 10*3/uL — ABNORMAL HIGH (ref 0.00–0.07)
Basophils Absolute: 0.4 10*3/uL — ABNORMAL HIGH (ref 0.0–0.1)
Basophils Relative: 3 %
Eosinophils Absolute: 1.1 10*3/uL — ABNORMAL HIGH (ref 0.0–0.5)
Eosinophils Relative: 11 %
HCT: 32.1 % — ABNORMAL LOW (ref 39.0–52.0)
Hemoglobin: 10.5 g/dL — ABNORMAL LOW (ref 13.0–17.0)
Immature Granulocytes: 1 %
Lymphocytes Relative: 16 %
Lymphs Abs: 1.6 10*3/uL (ref 0.7–4.0)
MCH: 29.8 pg (ref 26.0–34.0)
MCHC: 32.7 g/dL (ref 30.0–36.0)
MCV: 91.2 fL (ref 80.0–100.0)
Monocytes Absolute: 0.9 10*3/uL (ref 0.1–1.0)
Monocytes Relative: 9 %
Neutro Abs: 6.3 10*3/uL (ref 1.7–7.7)
Neutrophils Relative %: 60 %
Platelet Count: 306 10*3/uL (ref 150–400)
RBC: 3.52 MIL/uL — ABNORMAL LOW (ref 4.22–5.81)
RDW: 32.9 % — ABNORMAL HIGH (ref 11.5–15.5)
WBC Count: 10.4 10*3/uL (ref 4.0–10.5)
nRBC: 0.2 % (ref 0.0–0.2)

## 2020-01-20 LAB — CMP (CANCER CENTER ONLY)
ALT: 44 U/L (ref 0–44)
AST: 23 U/L (ref 15–41)
Albumin: 3.8 g/dL (ref 3.5–5.0)
Alkaline Phosphatase: 72 U/L (ref 38–126)
Anion gap: 4 — ABNORMAL LOW (ref 5–15)
BUN: 26 mg/dL — ABNORMAL HIGH (ref 8–23)
CO2: 27 mmol/L (ref 22–32)
Calcium: 9.2 mg/dL (ref 8.9–10.3)
Chloride: 107 mmol/L (ref 98–111)
Creatinine: 1.47 mg/dL — ABNORMAL HIGH (ref 0.61–1.24)
GFR, Estimated: 46 mL/min — ABNORMAL LOW (ref >60)
Glucose, Bld: 107 mg/dL — ABNORMAL HIGH (ref 70–99)
Potassium: 4.8 mmol/L (ref 3.5–5.1)
Sodium: 138 mmol/L (ref 135–145)
Total Bilirubin: 0.9 mg/dL (ref 0.3–1.2)
Total Protein: 7.1 g/dL (ref 6.5–8.1)

## 2020-01-20 LAB — LACTATE DEHYDROGENASE: LDH: 189 U/L (ref 98–192)

## 2020-01-20 MED ORDER — EPOETIN ALFA-EPBX 40000 UNIT/ML IJ SOLN
40000.0000 [IU] | Freq: Once | INTRAMUSCULAR | Status: AC
  Administered 2020-01-20: 40000 [IU] via SUBCUTANEOUS

## 2020-01-20 MED ORDER — EPOETIN ALFA-EPBX 40000 UNIT/ML IJ SOLN
INTRAMUSCULAR | Status: AC
  Filled 2020-01-20: qty 1

## 2020-01-20 NOTE — Patient Instructions (Signed)

## 2020-01-24 ENCOUNTER — Other Ambulatory Visit: Payer: Self-pay | Admitting: Medical Oncology

## 2020-01-24 DIAGNOSIS — D469 Myelodysplastic syndrome, unspecified: Secondary | ICD-10-CM

## 2020-01-27 ENCOUNTER — Other Ambulatory Visit: Payer: Self-pay

## 2020-01-27 ENCOUNTER — Inpatient Hospital Stay: Payer: Medicare Other

## 2020-01-27 DIAGNOSIS — D469 Myelodysplastic syndrome, unspecified: Secondary | ICD-10-CM

## 2020-01-27 DIAGNOSIS — D461 Refractory anemia with ring sideroblasts: Secondary | ICD-10-CM | POA: Diagnosis not present

## 2020-01-27 LAB — CBC WITH DIFFERENTIAL (CANCER CENTER ONLY)
Abs Immature Granulocytes: 0.03 10*3/uL (ref 0.00–0.07)
Basophils Absolute: 0.4 10*3/uL — ABNORMAL HIGH (ref 0.0–0.1)
Basophils Relative: 6 %
Eosinophils Absolute: 0.5 10*3/uL (ref 0.0–0.5)
Eosinophils Relative: 6 %
HCT: 34.9 % — ABNORMAL LOW (ref 39.0–52.0)
Hemoglobin: 11.2 g/dL — ABNORMAL LOW (ref 13.0–17.0)
Immature Granulocytes: 0 %
Lymphocytes Relative: 20 %
Lymphs Abs: 1.5 10*3/uL (ref 0.7–4.0)
MCH: 29.7 pg (ref 26.0–34.0)
MCHC: 32.1 g/dL (ref 30.0–36.0)
MCV: 92.6 fL (ref 80.0–100.0)
Monocytes Absolute: 0.7 10*3/uL (ref 0.1–1.0)
Monocytes Relative: 10 %
Neutro Abs: 4.4 10*3/uL (ref 1.7–7.7)
Neutrophils Relative %: 58 %
Platelet Count: 159 10*3/uL (ref 150–400)
RBC: 3.77 MIL/uL — ABNORMAL LOW (ref 4.22–5.81)
RDW: 32.3 % — ABNORMAL HIGH (ref 11.5–15.5)
WBC Count: 7.5 10*3/uL (ref 4.0–10.5)
nRBC: 0 % (ref 0.0–0.2)

## 2020-01-27 LAB — CMP (CANCER CENTER ONLY)
ALT: 35 U/L (ref 0–44)
AST: 17 U/L (ref 15–41)
Albumin: 4 g/dL (ref 3.5–5.0)
Alkaline Phosphatase: 69 U/L (ref 38–126)
Anion gap: 4 — ABNORMAL LOW (ref 5–15)
BUN: 32 mg/dL — ABNORMAL HIGH (ref 8–23)
CO2: 26 mmol/L (ref 22–32)
Calcium: 9.4 mg/dL (ref 8.9–10.3)
Chloride: 110 mmol/L (ref 98–111)
Creatinine: 1.41 mg/dL — ABNORMAL HIGH (ref 0.61–1.24)
GFR, Estimated: 49 mL/min — ABNORMAL LOW (ref >60)
Glucose, Bld: 108 mg/dL — ABNORMAL HIGH (ref 70–99)
Potassium: 4.9 mmol/L (ref 3.5–5.1)
Sodium: 140 mmol/L (ref 135–145)
Total Bilirubin: 0.8 mg/dL (ref 0.3–1.2)
Total Protein: 7.3 g/dL (ref 6.5–8.1)

## 2020-01-27 LAB — SAMPLE TO BLOOD BANK

## 2020-01-27 NOTE — Progress Notes (Signed)
No injection today, gave patient a copy of labs.

## 2020-01-31 ENCOUNTER — Other Ambulatory Visit: Payer: Self-pay

## 2020-01-31 DIAGNOSIS — D469 Myelodysplastic syndrome, unspecified: Secondary | ICD-10-CM

## 2020-02-03 ENCOUNTER — Inpatient Hospital Stay: Payer: Medicare Other

## 2020-02-03 ENCOUNTER — Other Ambulatory Visit: Payer: Self-pay

## 2020-02-03 VITALS — BP 104/77 | HR 71 | Temp 98.2°F | Resp 18

## 2020-02-03 DIAGNOSIS — D461 Refractory anemia with ring sideroblasts: Secondary | ICD-10-CM | POA: Diagnosis not present

## 2020-02-03 DIAGNOSIS — D469 Myelodysplastic syndrome, unspecified: Secondary | ICD-10-CM

## 2020-02-03 LAB — CMP (CANCER CENTER ONLY)
ALT: 31 U/L (ref 0–44)
AST: 16 U/L (ref 15–41)
Albumin: 3.9 g/dL (ref 3.5–5.0)
Alkaline Phosphatase: 67 U/L (ref 38–126)
Anion gap: 3 — ABNORMAL LOW (ref 5–15)
BUN: 26 mg/dL — ABNORMAL HIGH (ref 8–23)
CO2: 28 mmol/L (ref 22–32)
Calcium: 9.1 mg/dL (ref 8.9–10.3)
Chloride: 112 mmol/L — ABNORMAL HIGH (ref 98–111)
Creatinine: 1.33 mg/dL — ABNORMAL HIGH (ref 0.61–1.24)
GFR, Estimated: 56 mL/min — ABNORMAL LOW (ref >60)
Glucose, Bld: 104 mg/dL — ABNORMAL HIGH (ref 70–99)
Potassium: 5 mmol/L (ref 3.5–5.1)
Sodium: 143 mmol/L (ref 135–145)
Total Bilirubin: 0.7 mg/dL (ref 0.3–1.2)
Total Protein: 6.8 g/dL (ref 6.5–8.1)

## 2020-02-03 LAB — CBC WITH DIFFERENTIAL (CANCER CENTER ONLY)
Abs Immature Granulocytes: 0.03 10*3/uL (ref 0.00–0.07)
Basophils Absolute: 0.2 10*3/uL — ABNORMAL HIGH (ref 0.0–0.1)
Basophils Relative: 3 %
Eosinophils Absolute: 1.7 10*3/uL — ABNORMAL HIGH (ref 0.0–0.5)
Eosinophils Relative: 20 %
HCT: 31.9 % — ABNORMAL LOW (ref 39.0–52.0)
Hemoglobin: 10.2 g/dL — ABNORMAL LOW (ref 13.0–17.0)
Immature Granulocytes: 0 %
Lymphocytes Relative: 20 %
Lymphs Abs: 1.8 10*3/uL (ref 0.7–4.0)
MCH: 29.6 pg (ref 26.0–34.0)
MCHC: 32 g/dL (ref 30.0–36.0)
MCV: 92.5 fL (ref 80.0–100.0)
Monocytes Absolute: 0.5 10*3/uL (ref 0.1–1.0)
Monocytes Relative: 6 %
Neutro Abs: 4.4 10*3/uL (ref 1.7–7.7)
Neutrophils Relative %: 51 %
Platelet Count: 265 10*3/uL (ref 150–400)
RBC: 3.45 MIL/uL — ABNORMAL LOW (ref 4.22–5.81)
RDW: 31.8 % — ABNORMAL HIGH (ref 11.5–15.5)
WBC Count: 8.6 10*3/uL (ref 4.0–10.5)
nRBC: 0 % (ref 0.0–0.2)

## 2020-02-03 MED ORDER — EPOETIN ALFA-EPBX 40000 UNIT/ML IJ SOLN
40000.0000 [IU] | Freq: Once | INTRAMUSCULAR | Status: AC
  Administered 2020-02-03: 40000 [IU] via SUBCUTANEOUS

## 2020-02-03 MED ORDER — EPOETIN ALFA-EPBX 40000 UNIT/ML IJ SOLN
INTRAMUSCULAR | Status: AC
  Filled 2020-02-03: qty 1

## 2020-02-10 ENCOUNTER — Inpatient Hospital Stay (HOSPITAL_BASED_OUTPATIENT_CLINIC_OR_DEPARTMENT_OTHER): Payer: Medicare Other | Admitting: Internal Medicine

## 2020-02-10 ENCOUNTER — Inpatient Hospital Stay: Payer: Medicare Other

## 2020-02-10 ENCOUNTER — Inpatient Hospital Stay: Payer: Medicare Other | Attending: Internal Medicine

## 2020-02-10 ENCOUNTER — Encounter: Payer: Self-pay | Admitting: Internal Medicine

## 2020-02-10 ENCOUNTER — Other Ambulatory Visit: Payer: Self-pay

## 2020-02-10 ENCOUNTER — Other Ambulatory Visit: Payer: Self-pay | Admitting: Medical Oncology

## 2020-02-10 VITALS — BP 134/69 | HR 79 | Resp 18

## 2020-02-10 VITALS — BP 133/67 | HR 79 | Temp 97.3°F | Resp 18 | Ht 68.0 in | Wt 170.6 lb

## 2020-02-10 DIAGNOSIS — D469 Myelodysplastic syndrome, unspecified: Secondary | ICD-10-CM

## 2020-02-10 DIAGNOSIS — D473 Essential (hemorrhagic) thrombocythemia: Secondary | ICD-10-CM | POA: Insufficient documentation

## 2020-02-10 DIAGNOSIS — K30 Functional dyspepsia: Secondary | ICD-10-CM | POA: Diagnosis not present

## 2020-02-10 DIAGNOSIS — D6481 Anemia due to antineoplastic chemotherapy: Secondary | ICD-10-CM | POA: Diagnosis not present

## 2020-02-10 DIAGNOSIS — T451X5A Adverse effect of antineoplastic and immunosuppressive drugs, initial encounter: Secondary | ICD-10-CM

## 2020-02-10 DIAGNOSIS — R5383 Other fatigue: Secondary | ICD-10-CM | POA: Diagnosis not present

## 2020-02-10 DIAGNOSIS — D72829 Elevated white blood cell count, unspecified: Secondary | ICD-10-CM | POA: Diagnosis not present

## 2020-02-10 DIAGNOSIS — D461 Refractory anemia with ring sideroblasts: Secondary | ICD-10-CM | POA: Insufficient documentation

## 2020-02-10 DIAGNOSIS — Z79899 Other long term (current) drug therapy: Secondary | ICD-10-CM | POA: Insufficient documentation

## 2020-02-10 LAB — CMP (CANCER CENTER ONLY)
ALT: 30 U/L (ref 0–44)
AST: 18 U/L (ref 15–41)
Albumin: 3.9 g/dL (ref 3.5–5.0)
Alkaline Phosphatase: 59 U/L (ref 38–126)
Anion gap: 6 (ref 5–15)
BUN: 17 mg/dL (ref 8–23)
CO2: 28 mmol/L (ref 22–32)
Calcium: 9.3 mg/dL (ref 8.9–10.3)
Chloride: 108 mmol/L (ref 98–111)
Creatinine: 1.33 mg/dL — ABNORMAL HIGH (ref 0.61–1.24)
GFR, Estimated: 56 mL/min — ABNORMAL LOW (ref >60)
Glucose, Bld: 100 mg/dL — ABNORMAL HIGH (ref 70–99)
Potassium: 4.6 mmol/L (ref 3.5–5.1)
Sodium: 142 mmol/L (ref 135–145)
Total Bilirubin: 0.8 mg/dL (ref 0.3–1.2)
Total Protein: 7 g/dL (ref 6.5–8.1)

## 2020-02-10 LAB — CBC WITH DIFFERENTIAL (CANCER CENTER ONLY)
Abs Immature Granulocytes: 0.08 10*3/uL — ABNORMAL HIGH (ref 0.00–0.07)
Basophils Absolute: 0.2 10*3/uL — ABNORMAL HIGH (ref 0.0–0.1)
Basophils Relative: 1 %
Eosinophils Absolute: 1.9 10*3/uL — ABNORMAL HIGH (ref 0.0–0.5)
Eosinophils Relative: 15 %
HCT: 30.4 % — ABNORMAL LOW (ref 39.0–52.0)
Hemoglobin: 9.9 g/dL — ABNORMAL LOW (ref 13.0–17.0)
Immature Granulocytes: 1 %
Lymphocytes Relative: 14 %
Lymphs Abs: 1.7 10*3/uL (ref 0.7–4.0)
MCH: 29.9 pg (ref 26.0–34.0)
MCHC: 32.6 g/dL (ref 30.0–36.0)
MCV: 91.8 fL (ref 80.0–100.0)
Monocytes Absolute: 1.2 10*3/uL — ABNORMAL HIGH (ref 0.1–1.0)
Monocytes Relative: 10 %
Neutro Abs: 7.7 10*3/uL (ref 1.7–7.7)
Neutrophils Relative %: 59 %
Platelet Count: 325 10*3/uL (ref 150–400)
RBC: 3.31 MIL/uL — ABNORMAL LOW (ref 4.22–5.81)
RDW: 31.2 % — ABNORMAL HIGH (ref 11.5–15.5)
WBC Count: 12.8 10*3/uL — ABNORMAL HIGH (ref 4.0–10.5)
nRBC: 0 % (ref 0.0–0.2)

## 2020-02-10 MED ORDER — EPOETIN ALFA-EPBX 40000 UNIT/ML IJ SOLN
INTRAMUSCULAR | Status: AC
  Filled 2020-02-10: qty 1

## 2020-02-10 MED ORDER — EPOETIN ALFA-EPBX 40000 UNIT/ML IJ SOLN
40000.0000 [IU] | Freq: Once | INTRAMUSCULAR | Status: AC
  Administered 2020-02-10: 40000 [IU] via SUBCUTANEOUS

## 2020-02-10 NOTE — Progress Notes (Signed)
Roy Johnson  Telephone:([phone] Fax:([phone]) [phone]  OFFICE PROGRESS NOTE  [name], MD [address] [place] [address]  DIAGNOSIS: 1.  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 Essential thrombocythemia with positive JAK2 mutation 3. Leukocytosis.   PRIOR THERAPY: Previous treatment with combination of Hydrea and anagrelide.  CURRENT THERAPY:  1) Revlimid 5 mg by mouth daily.  Started 03/25/2016.  Status post 24 cycles. 2) anagrelide 1 mg by mouth every other day. 3) Aranesp 300 mcg subcutaneously every 3 weeks.  This is switched to Procrit 400 mcg subcutaneously every 2 weeks starting April 13, 2017.  The frequency of the Procrit was changed to weekly schedule when he was in [place].  He is currently on Retacrit weekly as a biosimilar to Procrit.  INTERVAL HISTORY: Roy Johnson 74 y.o. male returns to the clinic today for follow-up visit.  The patient is feeling fine today with no concerning complaints.  He has upset stomach yesterday but he is better today.  He denied having any current chest pain, shortness of breath, cough or hemoptysis.  He denied having any fever or chills.  He has no nausea, vomiting, diarrhea or constipation.  He has no weight loss or night sweats.  He is here today for evaluation and repeat blood work.   MEDICAL HISTORY: Past Medical History:  Diagnosis Date  . Anemia   . Colon polyp   . Diverticulitis   . Diverticulosis   . GERD (gastroesophageal reflux disease)   . Hyperlipidemia   . IBS (irritable bowel syndrome)   . Inguinal hernia   . 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  . Prostate cancer ([place])                . Prostate cancer ([place])   . Umbilical hernia   . Vitamin D deficiency     ALLERGIES:  has No Known Allergies.  MEDICATIONS:  Current Outpatient Medications  Medication Sig Dispense Refill  . amLODipine (NORVASC) 5 MG tablet Take 5  mg by mouth daily.  2  . anagrelide (AGRYLIN) 0.5 MG capsule Take 2 capsules (1 mg total) by mouth daily. (Patient not taking: Reported on 01/13/2020) 60 capsule 3  . anagrelide (AGRYLIN) 1 MG capsule TAKE 1 CAPSULE(1 MG) BY MOUTH DAILY (Patient not taking: Reported on 01/13/2020) 90 capsule 0  . aspirin EC 81 MG tablet Take 81 mg by mouth.    . cetirizine (ZYRTEC) 10 MG tablet TAKE 1 TABLET(10 MG) BY MOUTH DAILY (Patient not taking: Reported on 01/13/2020)    . epoetin alfa-epbx (RETACRIT) 62952 UNIT/ML injection Inject into the skin.    Marland Kitchen gabapentin (NEURONTIN) 100 MG capsule TAKE 1 CAPSULE BY MOUTH TWICE DAILY AND 2 AT NIGHT 120 capsule 3  . hyoscyamine (LEVSIN SL) 0.125 MG SL tablet Place 1 tablet (0.125 mg total) under the tongue every 4 (four) hours as needed. 90 tablet 3  . lenalidomide (REVLIMID) 5 MG capsule Take 5 mg by mouth daily.    Marland Kitchen losartan-hydrochlorothiazide (HYZAAR) 100-12.5 MG tablet Take 1 tablet by mouth daily.    . Multiple Vitamin (MULTI-VITAMINS) TABS Take 1 tablet by mouth daily.    . pantoprazole (PROTONIX) 40 MG tablet Take 40 mg by mouth daily.    . Polyethylene Glycol 3350 (PEG 3350) POWD nightly. (Patient not taking: Reported on 01/13/2020)    . vitamin B-12 (CYANOCOBALAMIN) 1000 MCG tablet Take 1 tablet by mouth daily as needed.  No current facility-administered medications for this visit.   Facility-Administered Medications Ordered in Other Visits  Medication Dose Route Frequency Provider Last Rate Last Admin  . 0.9 %  sodium chloride infusion  250 mL Intravenous Once Curt Bears, MD        SURGICAL HISTORY:  Past Surgical History:  Procedure Laterality Date  . COLONOSCOPY    . POLYPECTOMY    . PROSTATECTOMY    . TONSILLECTOMY      REVIEW OF SYSTEMS:  A comprehensive review of systems was negative except for: Constitutional: positive for fatigue   PHYSICAL EXAMINATION: General appearance: alert, cooperative, appears stated age, fatigued and no  distress Head: Normocephalic, without obvious abnormality, atraumatic Neck: no adenopathy, no JVD, supple, symmetrical, trachea midline and thyroid not enlarged, symmetric, no tenderness/mass/nodules Lymph nodes: Cervical, supraclavicular, and axillary nodes normal. Resp: clear to auscultation bilaterally Back: symmetric, no curvature. ROM normal. No CVA tenderness. Cardio: regular rate and rhythm, S1, S2 normal, no murmur, click, rub or gallop GI: soft, non-tender; bowel sounds normal; no masses,  no organomegaly Extremities: extremities normal, atraumatic, no cyanosis or edema  ECOG PERFORMANCE STATUS: 1 - Symptomatic but completely ambulatory  Blood pressure 133/67, pulse 79, temperature (!) 97.3 F (36.3 C), temperature source Tympanic, resp. rate 18, height 5\' 8"  (1.727 m), weight 170 lb 9.6 oz (77.4 kg), SpO2 99 %.  LABORATORY DATA:  Lab Results  Component Value Date   WBC 12.8 (H) 02/10/2020   HGB 9.9 (L) 02/10/2020   HCT 30.4 (L) 02/10/2020   MCV 91.8 02/10/2020   PLT 325 02/10/2020      Chemistry      Component Value Date/Time   NA 143 02/03/2020 0916   NA 141 12/08/2016 0928   K 5.0 02/03/2020 0916   K 4.5 12/08/2016 0928   CL 112 (H) 02/03/2020 0916   CO2 28 02/03/2020 0916   CO2 27 12/08/2016 0928   BUN 26 (H) 02/03/2020 0916   BUN 14.1 12/08/2016 0928   CREATININE 1.33 (H) 02/03/2020 0916   CREATININE 0.9 12/08/2016 0928      Component Value Date/Time   CALCIUM 9.1 02/03/2020 0916   CALCIUM 9.0 12/08/2016 0928   ALKPHOS 67 02/03/2020 0916   ALKPHOS 87 12/08/2016 0928   AST 16 02/03/2020 0916   AST 25 12/08/2016 0928   ALT 31 02/03/2020 0916   ALT 41 12/08/2016 0928   BILITOT 0.7 02/03/2020 0916   BILITOT 0.84 12/08/2016 0928      RADIOGRAPHIC STUDIES:  ASSESSMENT AND PLAN:  This is a very pleasant 74 years old white male with essential thrombocythemia with positive JAK-2 mutation as well as myelodysplastic syndrome. He is currently on treatment  with Revlimid 5 mg by mouth daily according to the recommendation from [facility].  He is also on treatment with Aranesp every 3 weeks.  Aranesp was a change it to Procrit initially every 2 weeks and currently on weekly basis.  He mentioned that he is feeling much better on Procrit weekly  He has been on the weekly treatment with Procrit for the last 15 months.   He is currently on treatment with Retacrit and he is tolerating it fairly well. CBC today showed a slight elevation of the total white blood count which could be secondary to the gastrointestinal issues he has yesterday.  His hemoglobin is 9.9 and platelets count are normal. I recommended for him to continue his current treatment with Retacrit as planned. I will see him back  for follow-up visit in 2 months for evaluation before he leaves to [place] which usually around 6 months. The patient was advised to call immediately if he has any other concerning symptoms in the interval.  All questions were answered. The patient knows to call the clinic with any problems, questions or concerns. We can certainly see the patient much sooner if necessary.   Disclaimer: This note was dictated with voice recognition software. Similar sounding words can inadvertently be transcribed and may not be corrected upon review.

## 2020-02-10 NOTE — Patient Instructions (Signed)

## 2020-02-17 ENCOUNTER — Inpatient Hospital Stay: Payer: Medicare Other

## 2020-02-17 ENCOUNTER — Other Ambulatory Visit: Payer: Self-pay

## 2020-02-17 VITALS — BP 137/73 | HR 77 | Temp 98.2°F | Resp 20

## 2020-02-17 DIAGNOSIS — D461 Refractory anemia with ring sideroblasts: Secondary | ICD-10-CM | POA: Diagnosis not present

## 2020-02-17 DIAGNOSIS — D473 Essential (hemorrhagic) thrombocythemia: Secondary | ICD-10-CM

## 2020-02-17 DIAGNOSIS — D469 Myelodysplastic syndrome, unspecified: Secondary | ICD-10-CM

## 2020-02-17 LAB — CBC WITH DIFFERENTIAL (CANCER CENTER ONLY)
Abs Immature Granulocytes: 0.08 10*3/uL — ABNORMAL HIGH (ref 0.00–0.07)
Basophils Absolute: 0.2 10*3/uL — ABNORMAL HIGH (ref 0.0–0.1)
Basophils Relative: 2 %
Eosinophils Absolute: 1.2 10*3/uL — ABNORMAL HIGH (ref 0.0–0.5)
Eosinophils Relative: 13 %
HCT: 30.2 % — ABNORMAL LOW (ref 39.0–52.0)
Hemoglobin: 9.8 g/dL — ABNORMAL LOW (ref 13.0–17.0)
Immature Granulocytes: 1 %
Lymphocytes Relative: 14 %
Lymphs Abs: 1.3 10*3/uL (ref 0.7–4.0)
MCH: 29.3 pg (ref 26.0–34.0)
MCHC: 32.5 g/dL (ref 30.0–36.0)
MCV: 90.4 fL (ref 80.0–100.0)
Monocytes Absolute: 1 10*3/uL (ref 0.1–1.0)
Monocytes Relative: 10 %
Neutro Abs: 5.8 10*3/uL (ref 1.7–7.7)
Neutrophils Relative %: 60 %
Platelet Count: 292 10*3/uL (ref 150–400)
RBC: 3.34 MIL/uL — ABNORMAL LOW (ref 4.22–5.81)
RDW: 31.3 % — ABNORMAL HIGH (ref 11.5–15.5)
WBC Count: 9.6 10*3/uL (ref 4.0–10.5)
nRBC: 0 % (ref 0.0–0.2)

## 2020-02-17 LAB — CMP (CANCER CENTER ONLY)
ALT: 34 U/L (ref 0–44)
AST: 17 U/L (ref 15–41)
Albumin: 4 g/dL (ref 3.5–5.0)
Alkaline Phosphatase: 68 U/L (ref 38–126)
Anion gap: 6 (ref 5–15)
BUN: 19 mg/dL (ref 8–23)
CO2: 29 mmol/L (ref 22–32)
Calcium: 8.7 mg/dL — ABNORMAL LOW (ref 8.9–10.3)
Chloride: 107 mmol/L (ref 98–111)
Creatinine: 1.48 mg/dL — ABNORMAL HIGH (ref 0.61–1.24)
GFR, Estimated: 49 mL/min — ABNORMAL LOW (ref >60)
Glucose, Bld: 104 mg/dL — ABNORMAL HIGH (ref 70–99)
Potassium: 4.7 mmol/L (ref 3.5–5.1)
Sodium: 142 mmol/L (ref 135–145)
Total Bilirubin: 1.2 mg/dL (ref 0.3–1.2)
Total Protein: 7.2 g/dL (ref 6.5–8.1)

## 2020-02-17 LAB — LACTATE DEHYDROGENASE: LDH: 181 U/L (ref 98–192)

## 2020-02-17 MED ORDER — EPOETIN ALFA-EPBX 40000 UNIT/ML IJ SOLN
INTRAMUSCULAR | Status: AC
  Filled 2020-02-17: qty 1

## 2020-02-17 MED ORDER — EPOETIN ALFA-EPBX 40000 UNIT/ML IJ SOLN
40000.0000 [IU] | Freq: Once | INTRAMUSCULAR | Status: AC
  Administered 2020-02-17: 40000 [IU] via SUBCUTANEOUS

## 2020-02-24 ENCOUNTER — Inpatient Hospital Stay: Payer: Medicare Other

## 2020-02-24 ENCOUNTER — Other Ambulatory Visit: Payer: Self-pay

## 2020-02-24 ENCOUNTER — Other Ambulatory Visit: Payer: Self-pay | Admitting: Internal Medicine

## 2020-02-24 VITALS — BP 138/69 | HR 77 | Resp 18

## 2020-02-24 DIAGNOSIS — D473 Essential (hemorrhagic) thrombocythemia: Secondary | ICD-10-CM

## 2020-02-24 DIAGNOSIS — D469 Myelodysplastic syndrome, unspecified: Secondary | ICD-10-CM

## 2020-02-24 DIAGNOSIS — D461 Refractory anemia with ring sideroblasts: Secondary | ICD-10-CM | POA: Diagnosis not present

## 2020-02-24 LAB — CBC WITH DIFFERENTIAL (CANCER CENTER ONLY)
Abs Immature Granulocytes: 0.03 10*3/uL (ref 0.00–0.07)
Basophils Absolute: 0.2 10*3/uL — ABNORMAL HIGH (ref 0.0–0.1)
Basophils Relative: 3 %
Eosinophils Absolute: 0.3 10*3/uL (ref 0.0–0.5)
Eosinophils Relative: 5 %
HCT: 32 % — ABNORMAL LOW (ref 39.0–52.0)
Hemoglobin: 10.3 g/dL — ABNORMAL LOW (ref 13.0–17.0)
Immature Granulocytes: 0 %
Lymphocytes Relative: 24 %
Lymphs Abs: 1.7 10*3/uL (ref 0.7–4.0)
MCH: 30.1 pg (ref 26.0–34.0)
MCHC: 32.2 g/dL (ref 30.0–36.0)
MCV: 93.6 fL (ref 80.0–100.0)
Monocytes Absolute: 0.7 10*3/uL (ref 0.1–1.0)
Monocytes Relative: 10 %
Neutro Abs: 4 10*3/uL (ref 1.7–7.7)
Neutrophils Relative %: 58 %
Platelet Count: 230 10*3/uL (ref 150–400)
RBC: 3.42 MIL/uL — ABNORMAL LOW (ref 4.22–5.81)
RDW: 31.4 % — ABNORMAL HIGH (ref 11.5–15.5)
WBC Count: 7 10*3/uL (ref 4.0–10.5)
nRBC: 0.3 % — ABNORMAL HIGH (ref 0.0–0.2)

## 2020-02-24 MED ORDER — EPOETIN ALFA-EPBX 40000 UNIT/ML IJ SOLN
40000.0000 [IU] | Freq: Once | INTRAMUSCULAR | Status: AC
  Administered 2020-02-24: 40000 [IU] via SUBCUTANEOUS

## 2020-02-24 MED ORDER — EPOETIN ALFA-EPBX 40000 UNIT/ML IJ SOLN
INTRAMUSCULAR | Status: AC
  Filled 2020-02-24: qty 1

## 2020-02-24 NOTE — Patient Instructions (Signed)

## 2020-03-02 ENCOUNTER — Inpatient Hospital Stay: Payer: Medicare Other

## 2020-03-02 ENCOUNTER — Other Ambulatory Visit: Payer: Self-pay

## 2020-03-02 DIAGNOSIS — D473 Essential (hemorrhagic) thrombocythemia: Secondary | ICD-10-CM

## 2020-03-02 DIAGNOSIS — D469 Myelodysplastic syndrome, unspecified: Secondary | ICD-10-CM

## 2020-03-02 DIAGNOSIS — D461 Refractory anemia with ring sideroblasts: Secondary | ICD-10-CM | POA: Diagnosis not present

## 2020-03-02 LAB — CBC WITH DIFFERENTIAL (CANCER CENTER ONLY)
Abs Immature Granulocytes: 0.21 10*3/uL — ABNORMAL HIGH (ref 0.00–0.07)
Band Neutrophils: 13 %
Basophils Absolute: 0.1 10*3/uL (ref 0.0–0.1)
Basophils Relative: 1 %
Blasts: 0 %
Eosinophils Absolute: 0.6 10*3/uL — ABNORMAL HIGH (ref 0.0–0.5)
Eosinophils Relative: 6 %
HCT: 34 % — ABNORMAL LOW (ref 39.0–52.0)
Hemoglobin: 10.9 g/dL — ABNORMAL LOW (ref 13.0–17.0)
Lymphocytes Relative: 36 %
Lymphs Abs: 3.8 10*3/uL (ref 0.7–4.0)
MCH: 30.1 pg (ref 26.0–34.0)
MCHC: 32.1 g/dL (ref 30.0–36.0)
MCV: 93.9 fL (ref 80.0–100.0)
Metamyelocytes Relative: 1 %
Monocytes Absolute: 0.4 10*3/uL (ref 0.1–1.0)
Monocytes Relative: 4 %
Myelocytes: 1 %
Neutro Abs: 5.4 10*3/uL (ref 1.7–7.7)
Neutrophils Relative %: 38 %
Other: 0 %
Platelet Count: 371 10*3/uL (ref 150–400)
Promyelocytes Relative: 0 %
RBC: 3.62 MIL/uL — ABNORMAL LOW (ref 4.22–5.81)
RDW: 31.7 % — ABNORMAL HIGH (ref 11.5–15.5)
WBC Count: 10.5 10*3/uL (ref 4.0–10.5)
nRBC: 0 /100 WBC
nRBC: 0.2 % (ref 0.0–0.2)

## 2020-03-02 MED ORDER — EPOETIN ALFA-EPBX 40000 UNIT/ML IJ SOLN
INTRAMUSCULAR | Status: AC
  Filled 2020-03-02: qty 1

## 2020-03-02 MED ORDER — EPOETIN ALFA-EPBX 40000 UNIT/ML IJ SOLN: 40000.0000 [IU] | Freq: Once | INTRAMUSCULAR | Status: DC

## 2020-03-02 NOTE — Progress Notes (Signed)
Per MD patient will not receive injection today. Made patient aware and gave a copy of labs.

## 2020-03-02 NOTE — Patient Instructions (Signed)

## 2020-03-02 NOTE — Progress Notes (Signed)
Per Dr. Julien Nordmann no Retacrit today with Hgb of 10.9.   Eddie Candle, PharmD PGY-2 Hematology/Oncology Pharmacy Resident

## 2020-03-09 ENCOUNTER — Inpatient Hospital Stay: Payer: Medicare Other

## 2020-03-09 ENCOUNTER — Other Ambulatory Visit: Payer: Self-pay

## 2020-03-09 VITALS — BP 143/76 | HR 76 | Resp 18

## 2020-03-09 DIAGNOSIS — D469 Myelodysplastic syndrome, unspecified: Secondary | ICD-10-CM

## 2020-03-09 DIAGNOSIS — D473 Essential (hemorrhagic) thrombocythemia: Secondary | ICD-10-CM

## 2020-03-09 DIAGNOSIS — D461 Refractory anemia with ring sideroblasts: Secondary | ICD-10-CM | POA: Diagnosis not present

## 2020-03-09 LAB — CBC WITH DIFFERENTIAL (CANCER CENTER ONLY)
Abs Immature Granulocytes: 0.05 10*3/uL (ref 0.00–0.07)
Basophils Absolute: 0.1 10*3/uL (ref 0.0–0.1)
Basophils Relative: 1 %
Eosinophils Absolute: 1 10*3/uL — ABNORMAL HIGH (ref 0.0–0.5)
Eosinophils Relative: 12 %
HCT: 32.3 % — ABNORMAL LOW (ref 39.0–52.0)
Hemoglobin: 10.4 g/dL — ABNORMAL LOW (ref 13.0–17.0)
Immature Granulocytes: 1 %
Lymphocytes Relative: 17 %
Lymphs Abs: 1.6 10*3/uL (ref 0.7–4.0)
MCH: 30.2 pg (ref 26.0–34.0)
MCHC: 32.2 g/dL (ref 30.0–36.0)
MCV: 93.9 fL (ref 80.0–100.0)
Monocytes Absolute: 0.9 10*3/uL (ref 0.1–1.0)
Monocytes Relative: 11 %
Neutro Abs: 5.3 10*3/uL (ref 1.7–7.7)
Neutrophils Relative %: 58 %
Platelet Count: 243 10*3/uL (ref 150–400)
RBC: 3.44 MIL/uL — ABNORMAL LOW (ref 4.22–5.81)
RDW: 31.1 % — ABNORMAL HIGH (ref 11.5–15.5)
WBC Count: 9 10*3/uL (ref 4.0–10.5)
nRBC: 0 % (ref 0.0–0.2)

## 2020-03-09 MED ORDER — EPOETIN ALFA-EPBX 40000 UNIT/ML IJ SOLN
40000.0000 [IU] | Freq: Once | INTRAMUSCULAR | Status: AC
  Administered 2020-03-09: 40000 [IU] via SUBCUTANEOUS

## 2020-03-09 MED ORDER — EPOETIN ALFA-EPBX 40000 UNIT/ML IJ SOLN
INTRAMUSCULAR | Status: AC
  Filled 2020-03-09: qty 1

## 2020-03-09 NOTE — Patient Instructions (Signed)

## 2020-03-16 ENCOUNTER — Other Ambulatory Visit: Payer: Self-pay

## 2020-03-16 ENCOUNTER — Inpatient Hospital Stay: Payer: Medicare Other | Attending: Internal Medicine

## 2020-03-16 ENCOUNTER — Inpatient Hospital Stay: Payer: Medicare Other

## 2020-03-16 VITALS — BP 142/72 | HR 75 | Resp 18

## 2020-03-16 DIAGNOSIS — Z79899 Other long term (current) drug therapy: Secondary | ICD-10-CM | POA: Insufficient documentation

## 2020-03-16 DIAGNOSIS — D461 Refractory anemia with ring sideroblasts: Secondary | ICD-10-CM | POA: Insufficient documentation

## 2020-03-16 DIAGNOSIS — D473 Essential (hemorrhagic) thrombocythemia: Secondary | ICD-10-CM | POA: Diagnosis not present

## 2020-03-16 DIAGNOSIS — D469 Myelodysplastic syndrome, unspecified: Secondary | ICD-10-CM

## 2020-03-16 DIAGNOSIS — Z8546 Personal history of malignant neoplasm of prostate: Secondary | ICD-10-CM | POA: Diagnosis not present

## 2020-03-16 DIAGNOSIS — D72829 Elevated white blood cell count, unspecified: Secondary | ICD-10-CM | POA: Insufficient documentation

## 2020-03-16 LAB — CBC WITH DIFFERENTIAL (CANCER CENTER ONLY)
Abs Immature Granulocytes: 0.08 10*3/uL — ABNORMAL HIGH (ref 0.00–0.07)
Basophils Absolute: 0.2 10*3/uL — ABNORMAL HIGH (ref 0.0–0.1)
Basophils Relative: 2 %
Eosinophils Absolute: 1.2 10*3/uL — ABNORMAL HIGH (ref 0.0–0.5)
Eosinophils Relative: 13 %
HCT: 29.2 % — ABNORMAL LOW (ref 39.0–52.0)
Hemoglobin: 9.4 g/dL — ABNORMAL LOW (ref 13.0–17.0)
Immature Granulocytes: 1 %
Lymphocytes Relative: 16 %
Lymphs Abs: 1.5 10*3/uL (ref 0.7–4.0)
MCH: 29.7 pg (ref 26.0–34.0)
MCHC: 32.2 g/dL (ref 30.0–36.0)
MCV: 92.1 fL (ref 80.0–100.0)
Monocytes Absolute: 0.8 10*3/uL (ref 0.1–1.0)
Monocytes Relative: 9 %
Neutro Abs: 5.8 10*3/uL (ref 1.7–7.7)
Neutrophils Relative %: 59 %
Platelet Count: 293 10*3/uL (ref 150–400)
RBC: 3.17 MIL/uL — ABNORMAL LOW (ref 4.22–5.81)
RDW: 31.3 % — ABNORMAL HIGH (ref 11.5–15.5)
WBC Count: 9.6 10*3/uL (ref 4.0–10.5)
nRBC: 0 % (ref 0.0–0.2)

## 2020-03-16 MED ORDER — EPOETIN ALFA-EPBX 40000 UNIT/ML IJ SOLN
40000.0000 [IU] | Freq: Once | INTRAMUSCULAR | Status: AC
  Administered 2020-03-16: 40000 [IU] via SUBCUTANEOUS

## 2020-03-16 MED ORDER — EPOETIN ALFA-EPBX 40000 UNIT/ML IJ SOLN
INTRAMUSCULAR | Status: AC
  Filled 2020-03-16: qty 1

## 2020-03-16 NOTE — Patient Instructions (Signed)

## 2020-03-20 ENCOUNTER — Other Ambulatory Visit: Payer: Self-pay | Admitting: Neurology

## 2020-03-23 ENCOUNTER — Telehealth: Payer: Self-pay | Admitting: Neurology

## 2020-03-23 ENCOUNTER — Other Ambulatory Visit: Payer: Self-pay

## 2020-03-23 ENCOUNTER — Inpatient Hospital Stay: Payer: Medicare Other

## 2020-03-23 VITALS — BP 142/75 | HR 75 | Temp 97.7°F | Resp 18

## 2020-03-23 DIAGNOSIS — D469 Myelodysplastic syndrome, unspecified: Secondary | ICD-10-CM

## 2020-03-23 DIAGNOSIS — D473 Essential (hemorrhagic) thrombocythemia: Secondary | ICD-10-CM

## 2020-03-23 DIAGNOSIS — D461 Refractory anemia with ring sideroblasts: Secondary | ICD-10-CM | POA: Diagnosis not present

## 2020-03-23 LAB — CMP (CANCER CENTER ONLY)
ALT: 38 U/L (ref 0–44)
AST: 26 U/L (ref 15–41)
Albumin: 4.4 g/dL (ref 3.5–5.0)
Alkaline Phosphatase: 68 U/L (ref 38–126)
Anion gap: 7 (ref 5–15)
BUN: 23 mg/dL (ref 8–23)
CO2: 24 mmol/L (ref 22–32)
Calcium: 9.2 mg/dL (ref 8.9–10.3)
Chloride: 109 mmol/L (ref 98–111)
Creatinine: 1.39 mg/dL — ABNORMAL HIGH (ref 0.61–1.24)
GFR, Estimated: 53 mL/min — ABNORMAL LOW (ref >60)
Glucose, Bld: 96 mg/dL (ref 70–99)
Potassium: 4 mmol/L (ref 3.5–5.1)
Sodium: 140 mmol/L (ref 135–145)
Total Bilirubin: 1.1 mg/dL (ref 0.3–1.2)
Total Protein: 7.5 g/dL (ref 6.5–8.1)

## 2020-03-23 LAB — CBC WITH DIFFERENTIAL (CANCER CENTER ONLY)
Abs Immature Granulocytes: 0.11 10*3/uL — ABNORMAL HIGH (ref 0.00–0.07)
Basophils Absolute: 0.2 10*3/uL — ABNORMAL HIGH (ref 0.0–0.1)
Basophils Relative: 2 %
Eosinophils Absolute: 0.8 10*3/uL — ABNORMAL HIGH (ref 0.0–0.5)
Eosinophils Relative: 9 %
HCT: 30.1 % — ABNORMAL LOW (ref 39.0–52.0)
Hemoglobin: 9.9 g/dL — ABNORMAL LOW (ref 13.0–17.0)
Immature Granulocytes: 1 %
Lymphocytes Relative: 18 %
Lymphs Abs: 1.7 10*3/uL (ref 0.7–4.0)
MCH: 30.4 pg (ref 26.0–34.0)
MCHC: 32.9 g/dL (ref 30.0–36.0)
MCV: 92.3 fL (ref 80.0–100.0)
Monocytes Absolute: 0.8 10*3/uL (ref 0.1–1.0)
Monocytes Relative: 8 %
Neutro Abs: 5.9 10*3/uL (ref 1.7–7.7)
Neutrophils Relative %: 62 %
Platelet Count: 317 10*3/uL (ref 150–400)
RBC: 3.26 MIL/uL — ABNORMAL LOW (ref 4.22–5.81)
RDW: 31.7 % — ABNORMAL HIGH (ref 11.5–15.5)
WBC Count: 9.4 10*3/uL (ref 4.0–10.5)
nRBC: 0.2 % (ref 0.0–0.2)

## 2020-03-23 MED ORDER — EPOETIN ALFA-EPBX 40000 UNIT/ML IJ SOLN
40000.0000 [IU] | Freq: Once | INTRAMUSCULAR | Status: AC
  Administered 2020-03-23: 40000 [IU] via SUBCUTANEOUS

## 2020-03-23 MED ORDER — EPOETIN ALFA-EPBX 40000 UNIT/ML IJ SOLN
INTRAMUSCULAR | Status: AC
  Filled 2020-03-23: qty 1

## 2020-03-23 NOTE — Patient Instructions (Signed)

## 2020-03-23 NOTE — Telephone Encounter (Signed)
Pt is requesting a refill for gabapentin (NEURONTIN) 100 MG capsule.  Pharmacy: [facility] #[id number]  Pt said his pharmacy advised him to call the office for the refill

## 2020-03-24 ENCOUNTER — Other Ambulatory Visit: Payer: Self-pay | Admitting: Emergency Medicine

## 2020-03-24 ENCOUNTER — Telehealth: Payer: Self-pay | Admitting: Neurology

## 2020-03-24 MED ORDER — GABAPENTIN 100 MG PO CAPS: ORAL_CAPSULE | ORAL | 1 refills | Status: DC

## 2020-03-24 NOTE — Telephone Encounter (Signed)
Pt called, needing refill for gabapentin (NEURONTIN) 100 MG capsule at [facility] #[id number].  Patient has scheduled appt on 04/01/20. Pt would like a call from the nurse.

## 2020-03-30 ENCOUNTER — Other Ambulatory Visit: Payer: Self-pay

## 2020-03-30 ENCOUNTER — Inpatient Hospital Stay: Payer: Medicare Other

## 2020-03-30 VITALS — BP 125/67 | HR 77 | Resp 18

## 2020-03-30 DIAGNOSIS — D473 Essential (hemorrhagic) thrombocythemia: Secondary | ICD-10-CM

## 2020-03-30 DIAGNOSIS — D461 Refractory anemia with ring sideroblasts: Secondary | ICD-10-CM | POA: Diagnosis not present

## 2020-03-30 DIAGNOSIS — D469 Myelodysplastic syndrome, unspecified: Secondary | ICD-10-CM

## 2020-03-30 LAB — CBC WITH DIFFERENTIAL (CANCER CENTER ONLY)
Abs Immature Granulocytes: 0.06 10*3/uL (ref 0.00–0.07)
Basophils Absolute: 0.4 10*3/uL — ABNORMAL HIGH (ref 0.0–0.1)
Basophils Relative: 5 %
Eosinophils Absolute: 0.3 10*3/uL (ref 0.0–0.5)
Eosinophils Relative: 4 %
HCT: 30.6 % — ABNORMAL LOW (ref 39.0–52.0)
Hemoglobin: 9.8 g/dL — ABNORMAL LOW (ref 13.0–17.0)
Immature Granulocytes: 1 %
Lymphocytes Relative: 18 %
Lymphs Abs: 1.5 10*3/uL (ref 0.7–4.0)
MCH: 29.9 pg (ref 26.0–34.0)
MCHC: 32 g/dL (ref 30.0–36.0)
MCV: 93.3 fL (ref 80.0–100.0)
Monocytes Absolute: 0.7 10*3/uL (ref 0.1–1.0)
Monocytes Relative: 9 %
Neutro Abs: 5.1 10*3/uL (ref 1.7–7.7)
Neutrophils Relative %: 63 %
Platelet Count: 244 10*3/uL (ref 150–400)
RBC: 3.28 MIL/uL — ABNORMAL LOW (ref 4.22–5.81)
RDW: 31.3 % — ABNORMAL HIGH (ref 11.5–15.5)
WBC Count: 8.1 10*3/uL (ref 4.0–10.5)
nRBC: 0 % (ref 0.0–0.2)

## 2020-03-30 MED ORDER — EPOETIN ALFA-EPBX 40000 UNIT/ML IJ SOLN
INTRAMUSCULAR | Status: AC
  Filled 2020-03-30: qty 1

## 2020-03-30 MED ORDER — EPOETIN ALFA-EPBX 40000 UNIT/ML IJ SOLN
40000.0000 [IU] | Freq: Once | INTRAMUSCULAR | Status: AC
  Administered 2020-03-30: 12:00:00 40000 [IU] via SUBCUTANEOUS

## 2020-03-30 NOTE — Patient Instructions (Signed)

## 2020-04-01 ENCOUNTER — Ambulatory Visit: Payer: Medicare Other | Admitting: Neurology

## 2020-04-01 ENCOUNTER — Other Ambulatory Visit: Payer: Self-pay

## 2020-04-01 ENCOUNTER — Telehealth: Payer: Self-pay | Admitting: Neurology

## 2020-04-01 ENCOUNTER — Encounter: Payer: Self-pay | Admitting: Neurology

## 2020-04-01 VITALS — BP 113/65 | HR 87 | Ht 68.0 in | Wt 170.0 lb

## 2020-04-01 DIAGNOSIS — R5383 Other fatigue: Secondary | ICD-10-CM

## 2020-04-01 DIAGNOSIS — R202 Paresthesia of skin: Secondary | ICD-10-CM | POA: Diagnosis not present

## 2020-04-01 DIAGNOSIS — E538 Deficiency of other specified B group vitamins: Secondary | ICD-10-CM

## 2020-04-01 DIAGNOSIS — R413 Other amnesia: Secondary | ICD-10-CM

## 2020-04-01 NOTE — Progress Notes (Signed)
Reason for visit: Paresthesias  Referring physician: Dr. Holland Commons is a 74 y.o. male  History of present illness:  Roy Johnson is a 74 year old right-handed white male with a history of myelodysplasia.  The patient has been seen through this office previously for some troubles with focusing and memory and fatigue.  The patient also has a chronic issue with a pressure sensation across the forehead felt secondary to a muscle tension type headache.  He comes back to the office with several new problems.  He indicates that over the last year or so he has had some tingling in the feet and in the hands.  He has developed episodes of sharp stabbing pain in the webspace between the index finger and thumb that may occur off and on and last anywhere from a few minutes to up to 2 days.  The patient denies any actual weakness of the arms, he denies pain in the neck or shoulders or pain radiating down the arms from the neck.  He indicates that there are no activators for the pain, it may come on spontaneously.  The tingling in the fingers is present at all times.  He denies any significant changes in balance, he denies issues controlling the bowels or the bladder.  He has also noted that he has an altered sensation around the corner of the right eye, he may have some twitching of the eyelid that may come on when he tries to read or watch TV.  Indicates that when he radiates in the bends his head down he has tends to get sleepy and tired and drift off to sleep.  This somehow is associated with the pressure sensation in the forehead, as that worsens the sensation of wanting to sleep increases.  He has a foggy headed sensation.  The patient comes back to this office for further evaluation.  Past Medical History:  Diagnosis Date  . Anemia   . Colon polyp   . Diverticulitis   . Diverticulosis   . GERD (gastroesophageal reflux disease)   . Hyperlipidemia   . IBS (irritable bowel syndrome)   .  Inguinal hernia   . 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  . Numbness and tingling in both hands   . Prostate cancer ([place])                . Prostate cancer ([place])   . Umbilical hernia   . Vitamin D deficiency     Past Surgical History:  Procedure Laterality Date  . COLONOSCOPY    . POLYPECTOMY    . PROSTATECTOMY    . TONSILLECTOMY      Family History  Problem Relation Age of Onset  . Heart disease Father   . Colon polyps Father   . Lung cancer Father        cancerous mole  . Prostate cancer Father   . Osteoarthritis Mother   . Breast cancer Mother   . Colon cancer Neg Hx   . Esophageal cancer Neg Hx   . Rectal cancer Neg Hx   . Stomach cancer Neg Hx     Social history:  reports that he quit smoking about 46 years ago. He has never used smokeless tobacco. He reports current alcohol use of about 7.0 standard drinks of alcohol per week. He reports that he does not use drugs.  Medications:  Prior to Admission medications   Medication Sig Start Date End Date Taking? Authorizing Provider  amLODipine (NORVASC) 5 MG  tablet Take 5 mg by mouth daily. 08/30/17  Yes [provider]  anagrelide (AGRYLIN) 1 MG capsule TAKE 1 CAPSULE(1 MG) BY MOUTH DAILY 08/09/19  Yes Curt Bears, MD  cetirizine (ZYRTEC) 10 MG tablet TAKE 1 TABLET(10 MG) BY MOUTH DAILY 07/29/19  Yes [provider]  epoetin alfa-epbx (RETACRIT) 16109 UNIT/ML injection Inject into the skin.   Yes [provider]  gabapentin (NEURONTIN) 100 MG capsule TAKE 1 CAPSULE BY MOUTH TWICE DAILY AND 2 AT NIGHT 03/24/20  Yes Kathrynn Ducking, MD  hyoscyamine (LEVSIN SL) 0.125 MG SL tablet Place 1 tablet (0.125 mg total) under the tongue every 4 (four) hours as needed. 10/24/18  Yes Irene Shipper, MD  lenalidomide (REVLIMID) 5 MG capsule Take 5 mg by mouth daily.   Yes [provider]  losartan-hydrochlorothiazide (HYZAAR) 100-12.5 MG tablet Take 1 tablet by mouth daily. 12/26/19  Yes [provider]   Multiple Vitamin (MULTI-VITAMINS) TABS Take 1 tablet by mouth daily.   Yes [provider]  pantoprazole (PROTONIX) 40 MG tablet Take 40 mg by mouth daily. 12/26/19  Yes [provider]  Polyethylene Glycol 3350 (PEG 3350) POWD nightly. 08/10/15  Yes [provider]  vitamin B-12 (CYANOCOBALAMIN) 1000 MCG tablet Take 1 tablet by mouth daily as needed.   Yes [provider]     No Known Allergies  ROS:  Out of a complete 14 system review of symptoms, the patient complains only of the following symptoms, and all other reviewed systems are negative.  Drowsiness Difficulty with swallowing, nausea, decreased appetite Tingling in the hands and feet  Blood pressure 113/65, pulse 87, height 5\' 8"  (1.727 m), weight 170 lb (77.1 kg).  Physical Exam  General: The patient is alert and cooperative at the time of the examination.  Eyes: Pupils are equal, round, and reactive to light. Discs are flat bilaterally.  Neck: The neck is supple, no carotid bruits are noted.  Respiratory: The respiratory examination is clear.  Cardiovascular: The cardiovascular examination reveals a regular rate and rhythm, no obvious murmurs or rubs are noted.  Skin: Extremities are without significant edema.  Neurologic Exam  Mental status: The patient is alert and oriented x 3 at the time of the examination. The patient has apparent normal recent and remote memory, with an apparently normal attention span and concentration ability.  Cranial nerves: Facial symmetry is present. There is good sensation of the face to pinprick and soft touch bilaterally. The strength of the facial muscles and the muscles to head turning and shoulder shrug are normal bilaterally. Speech is well enunciated, no aphasia or dysarthria is noted. Extraocular movements are full. Visual fields are full. The tongue is midline, and the patient has symmetric elevation of the soft palate. No obvious hearing deficits  are noted.  Motor: The motor testing reveals 5 over 5 strength of all 4 extremities. Good symmetric motor tone is noted throughout.  Sensory: Sensory testing is intact to pinprick, soft touch, vibration sensation, and position sense on all 4 extremities.  No definite stocking pattern pinprick sensory deficit is noted.  Vibration sensation in the feet may be slightly decreased bilaterally.  No evidence of extinction is noted.  Coordination: Cerebellar testing reveals good finger-nose-finger and heel-to-shin bilaterally.  Gait and station: Gait is normal. Tandem gait is normal. Romberg is negative. No drift is seen.  Reflexes: Deep tendon reflexes are symmetric and normal bilaterally, with exception of some depression of ankle jerk reflexes bilaterally. Toes are  downgoing bilaterally.   Assessment/Plan:  1.  Paresthesias, all 4 extremities  2.  Bilateral hand pain  3.  Daytime drowsiness  4.  Altered sensation, right eye  5.  Difficulty focusing, memory disorder  The patient will be sent for blood work today.  He will have nerve conduction studies of the right leg and both arms, EMG of one of the arms.  The patient will be going to [place] in mid January and staying till April or May, at some point, we will consider a sleep consult evaluation for the daytime drowsiness.  He will follow up here in 6 months.  Jill Alexanders MD 04/01/2020 9:15 AM  [facility] [address] [place], [place] [address]  Phone [phone] Fax [phone]

## 2020-04-01 NOTE — Telephone Encounter (Signed)
Dr. Jannifer Franklin ordered a NCV for the patient, and pt is stating he needs it done before he leaves for vacation 01/15. The soonest EMG opening for Dr. Jannifer Franklin is 01/17. Can the patient see another provider for  EMG? There are sooner openings.

## 2020-04-01 NOTE — Telephone Encounter (Signed)
Okay to have another provider do EMG study.

## 2020-04-06 ENCOUNTER — Other Ambulatory Visit: Payer: Medicare Other

## 2020-04-06 ENCOUNTER — Ambulatory Visit: Payer: Medicare Other

## 2020-04-07 ENCOUNTER — Inpatient Hospital Stay: Payer: Medicare Other

## 2020-04-07 ENCOUNTER — Other Ambulatory Visit: Payer: Self-pay

## 2020-04-07 VITALS — BP 143/75 | HR 95 | Temp 98.2°F | Resp 18

## 2020-04-07 DIAGNOSIS — D469 Myelodysplastic syndrome, unspecified: Secondary | ICD-10-CM

## 2020-04-07 DIAGNOSIS — D473 Essential (hemorrhagic) thrombocythemia: Secondary | ICD-10-CM

## 2020-04-07 DIAGNOSIS — D461 Refractory anemia with ring sideroblasts: Secondary | ICD-10-CM | POA: Diagnosis not present

## 2020-04-07 LAB — CBC WITH DIFFERENTIAL (CANCER CENTER ONLY)
Abs Immature Granulocytes: 0.16 10*3/uL — ABNORMAL HIGH (ref 0.00–0.07)
Basophils Absolute: 0.2 10*3/uL — ABNORMAL HIGH (ref 0.0–0.1)
Basophils Relative: 2 %
Eosinophils Absolute: 1.2 10*3/uL — ABNORMAL HIGH (ref 0.0–0.5)
Eosinophils Relative: 12 %
HCT: 32.6 % — ABNORMAL LOW (ref 39.0–52.0)
Hemoglobin: 10.5 g/dL — ABNORMAL LOW (ref 13.0–17.0)
Immature Granulocytes: 2 %
Lymphocytes Relative: 16 %
Lymphs Abs: 1.5 10*3/uL (ref 0.7–4.0)
MCH: 30.4 pg (ref 26.0–34.0)
MCHC: 32.2 g/dL (ref 30.0–36.0)
MCV: 94.5 fL (ref 80.0–100.0)
Monocytes Absolute: 0.5 10*3/uL (ref 0.1–1.0)
Monocytes Relative: 5 %
Neutro Abs: 6.4 10*3/uL (ref 1.7–7.7)
Neutrophils Relative %: 63 %
Platelet Count: 324 10*3/uL (ref 150–400)
RBC: 3.45 MIL/uL — ABNORMAL LOW (ref 4.22–5.81)
RDW: 32 % — ABNORMAL HIGH (ref 11.5–15.5)
WBC Count: 9.9 10*3/uL (ref 4.0–10.5)
nRBC: 0.2 % (ref 0.0–0.2)

## 2020-04-07 LAB — ANGIOTENSIN CONVERTING ENZYME: Angio Convert Enzyme: 24 U/L (ref 14–82)

## 2020-04-07 LAB — ENA+DNA/DS+SJORGEN'S
ENA RNP Ab: 0.2 AI (ref 0.0–0.9)
ENA SM Ab Ser-aCnc: <0.2 AI (ref 0.0–0.9)
ENA SSA (RO) Ab: <0.2 AI (ref 0.0–0.9)
ENA SSB (LA) Ab: <0.2 AI (ref 0.0–0.9)
dsDNA Ab: 10 IU/mL — ABNORMAL HIGH (ref 0–9)

## 2020-04-07 LAB — MULTIPLE MYELOMA PANEL, SERUM
Albumin SerPl Elph-Mcnc: 4.2 g/dL (ref 2.9–4.4)
Albumin/Glob SerPl: 1.4 (ref 0.7–1.7)
Alpha 1: 0.2 g/dL (ref 0.0–0.4)
Alpha2 Glob SerPl Elph-Mcnc: 0.5 g/dL (ref 0.4–1.0)
B-Globulin SerPl Elph-Mcnc: 0.8 g/dL (ref 0.7–1.3)
Gamma Glob SerPl Elph-Mcnc: 1.6 g/dL (ref 0.4–1.8)
Globulin, Total: 3.1 g/dL (ref 2.2–3.9)
IgA/Immunoglobulin A, Serum: 219 mg/dL (ref 61–437)
IgG (Immunoglobin G), Serum: 1437 mg/dL (ref 603–1613)
IgM (Immunoglobulin M), Srm: 242 mg/dL — ABNORMAL HIGH (ref 15–143)
Total Protein: 7.3 g/dL (ref 6.0–8.5)

## 2020-04-07 LAB — COPPER, SERUM: Copper: 63 ug/dL — ABNORMAL LOW (ref 69–132)

## 2020-04-07 LAB — ACETYLCHOLINE RECEPTOR, BINDING: AChR Binding Ab, Serum: 0.08 nmol/L (ref 0.00–0.24)

## 2020-04-07 LAB — VITAMIN B12: Vitamin B-12: >2000 pg/mL — ABNORMAL HIGH (ref 232–1245)

## 2020-04-07 LAB — B. BURGDORFI ANTIBODIES: Lyme IgG/IgM Ab: <0.91 {ISR} (ref 0.00–0.90)

## 2020-04-07 LAB — SEDIMENTATION RATE: Sed Rate: 2 mm/hr (ref 0–30)

## 2020-04-07 LAB — ANA W/REFLEX: Anti Nuclear Antibody (ANA): POSITIVE — AB

## 2020-04-07 MED ORDER — EPOETIN ALFA-EPBX 40000 UNIT/ML IJ SOLN
INTRAMUSCULAR | Status: AC
  Filled 2020-04-07: qty 1

## 2020-04-07 MED ORDER — EPOETIN ALFA-EPBX 40000 UNIT/ML IJ SOLN
40000.0000 [IU] | Freq: Once | INTRAMUSCULAR | Status: AC
  Administered 2020-04-07: 10:00:00 40000 [IU] via SUBCUTANEOUS

## 2020-04-07 NOTE — Patient Instructions (Signed)

## 2020-04-13 ENCOUNTER — Inpatient Hospital Stay: Payer: Medicare Other | Attending: Internal Medicine | Admitting: Internal Medicine

## 2020-04-13 ENCOUNTER — Other Ambulatory Visit: Payer: Self-pay

## 2020-04-13 ENCOUNTER — Encounter: Payer: Self-pay | Admitting: Internal Medicine

## 2020-04-13 ENCOUNTER — Inpatient Hospital Stay: Payer: Medicare Other

## 2020-04-13 ENCOUNTER — Telehealth: Payer: Self-pay | Admitting: Internal Medicine

## 2020-04-13 VITALS — BP 150/79 | HR 81 | Temp 97.9°F | Resp 15 | Ht 68.0 in | Wt 171.7 lb

## 2020-04-13 DIAGNOSIS — D461 Refractory anemia with ring sideroblasts: Secondary | ICD-10-CM | POA: Diagnosis present

## 2020-04-13 DIAGNOSIS — D473 Essential (hemorrhagic) thrombocythemia: Secondary | ICD-10-CM | POA: Insufficient documentation

## 2020-04-13 DIAGNOSIS — D469 Myelodysplastic syndrome, unspecified: Secondary | ICD-10-CM

## 2020-04-13 DIAGNOSIS — D72829 Elevated white blood cell count, unspecified: Secondary | ICD-10-CM | POA: Insufficient documentation

## 2020-04-13 DIAGNOSIS — D75839 Thrombocytosis, unspecified: Secondary | ICD-10-CM | POA: Diagnosis not present

## 2020-04-13 DIAGNOSIS — R5383 Other fatigue: Secondary | ICD-10-CM | POA: Diagnosis not present

## 2020-04-13 DIAGNOSIS — T451X5A Adverse effect of antineoplastic and immunosuppressive drugs, initial encounter: Secondary | ICD-10-CM | POA: Diagnosis not present

## 2020-04-13 DIAGNOSIS — D6481 Anemia due to antineoplastic chemotherapy: Secondary | ICD-10-CM

## 2020-04-13 DIAGNOSIS — Z79899 Other long term (current) drug therapy: Secondary | ICD-10-CM | POA: Insufficient documentation

## 2020-04-13 DIAGNOSIS — R531 Weakness: Secondary | ICD-10-CM | POA: Insufficient documentation

## 2020-04-13 LAB — CBC WITH DIFFERENTIAL (CANCER CENTER ONLY)
Abs Immature Granulocytes: 0.18 10*3/uL — ABNORMAL HIGH (ref 0.00–0.07)
Basophils Absolute: 0.3 10*3/uL — ABNORMAL HIGH (ref 0.0–0.1)
Basophils Relative: 2 %
Eosinophils Absolute: 2.1 10*3/uL — ABNORMAL HIGH (ref 0.0–0.5)
Eosinophils Relative: 12 %
HCT: 33.8 % — ABNORMAL LOW (ref 39.0–52.0)
Hemoglobin: 10.8 g/dL — ABNORMAL LOW (ref 13.0–17.0)
Immature Granulocytes: 1 %
Lymphocytes Relative: 13 %
Lymphs Abs: 2.3 10*3/uL (ref 0.7–4.0)
MCH: 30.2 pg (ref 26.0–34.0)
MCHC: 32 g/dL (ref 30.0–36.0)
MCV: 94.4 fL (ref 80.0–100.0)
Monocytes Absolute: 1.5 10*3/uL — ABNORMAL HIGH (ref 0.1–1.0)
Monocytes Relative: 9 %
Neutro Abs: 11.1 10*3/uL — ABNORMAL HIGH (ref 1.7–7.7)
Neutrophils Relative %: 63 %
Platelet Count: 393 10*3/uL (ref 150–400)
RBC: 3.58 MIL/uL — ABNORMAL LOW (ref 4.22–5.81)
RDW: 31.4 % — ABNORMAL HIGH (ref 11.5–15.5)
WBC Count: 17.5 10*3/uL — ABNORMAL HIGH (ref 4.0–10.5)
nRBC: 0.2 % (ref 0.0–0.2)

## 2020-04-13 LAB — CMP (CANCER CENTER ONLY)
ALT: 40 U/L (ref 0–44)
AST: 20 U/L (ref 15–41)
Albumin: 4.4 g/dL (ref 3.5–5.0)
Alkaline Phosphatase: 71 U/L (ref 38–126)
Anion gap: 6 (ref 5–15)
BUN: 24 mg/dL — ABNORMAL HIGH (ref 8–23)
CO2: 27 mmol/L (ref 22–32)
Calcium: 9.4 mg/dL (ref 8.9–10.3)
Chloride: 109 mmol/L (ref 98–111)
Creatinine: 1.49 mg/dL — ABNORMAL HIGH (ref 0.61–1.24)
GFR, Estimated: 49 mL/min — ABNORMAL LOW (ref >60)
Glucose, Bld: 100 mg/dL — ABNORMAL HIGH (ref 70–99)
Potassium: 4.7 mmol/L (ref 3.5–5.1)
Sodium: 142 mmol/L (ref 135–145)
Total Bilirubin: 0.9 mg/dL (ref 0.3–1.2)
Total Protein: 7.6 g/dL (ref 6.5–8.1)

## 2020-04-13 MED ORDER — EPOETIN ALFA-EPBX 40000 UNIT/ML IJ SOLN
INTRAMUSCULAR | Status: AC
  Filled 2020-04-13: qty 1

## 2020-04-13 MED ORDER — EPOETIN ALFA-EPBX 40000 UNIT/ML IJ SOLN
40000.0000 [IU] | Freq: Once | INTRAMUSCULAR | Status: AC
  Administered 2020-04-13: 40000 [IU] via SUBCUTANEOUS

## 2020-04-13 NOTE — Patient Instructions (Signed)

## 2020-04-13 NOTE — Telephone Encounter (Signed)
Scheduled appointments per 1/3 los. Spoke to patient who is aware of appointments dates and times.

## 2020-04-13 NOTE — Progress Notes (Signed)
[facility]  Telephone:([phone]) [phone] Fax:(336) [phone]  OFFICE PROGRESS NOTE  [name], MD [address] [place] [address]  DIAGNOSIS: 1.  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 Essential thrombocythemia with positive JAK2 mutation 3. Leukocytosis.   PRIOR THERAPY: Previous treatment with combination of Hydrea and anagrelide.  CURRENT THERAPY:  1) Revlimid 5 mg by mouth daily.  Started 03/25/2016.  Status post 24 cycles. 2) anagrelide 1 mg by mouth every other day. 3) Aranesp 300 mcg subcutaneously every 3 weeks.  This is switched to Procrit 400 mcg subcutaneously every 2 weeks starting April 13, 2017.  The frequency of the Procrit was changed to weekly schedule when he was in [place].  He is currently on Retacrit weekly as a biosimilar to Procrit.  INTERVAL HISTORY: Roy Johnson 75 y.o. male returns to the clinic today for follow-up visit.  The patient continues to complain of increasing fatigue and weakness.  He was seen recently by neurology and had several studies performed.  He denied having any current chest pain but has shortness of breath with exertion with no cough or hemoptysis.  He denied having any fever or chills.  He has no nausea, vomiting, diarrhea or constipation.  He has no headache or visual changes.  The patient is here today for evaluation and repeat blood work as well as Retacrit injection.  MEDICAL HISTORY: Past Medical History:  Diagnosis Date  . Anemia   . Colon polyp   . Diverticulitis   . Diverticulosis   . GERD (gastroesophageal reflux disease)   . Hyperlipidemia   . IBS (irritable bowel syndrome)   . Inguinal hernia   . 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  . Numbness and tingling in both hands   . Prostate cancer ([place])                . Prostate cancer ([place])   . Umbilical hernia   . Vitamin D deficiency     ALLERGIES:  has No Known  Allergies.  MEDICATIONS:  Current Outpatient Medications  Medication Sig Dispense Refill  . amLODipine (NORVASC) 5 MG tablet Take 5 mg by mouth daily.  2  . anagrelide (AGRYLIN) 1 MG capsule TAKE 1 CAPSULE(1 MG) BY MOUTH DAILY 90 capsule 0  . cetirizine (ZYRTEC) 10 MG tablet TAKE 1 TABLET(10 MG) BY MOUTH DAILY    . epoetin alfa-epbx (RETACRIT) 25956 UNIT/ML injection Inject into the skin.    Marland Kitchen gabapentin (NEURONTIN) 100 MG capsule TAKE 1 CAPSULE BY MOUTH TWICE DAILY AND 2 AT NIGHT 120 capsule 1  . hyoscyamine (LEVSIN SL) 0.125 MG SL tablet Place 1 tablet (0.125 mg total) under the tongue every 4 (four) hours as needed. 90 tablet 3  . lenalidomide (REVLIMID) 5 MG capsule Take 5 mg by mouth daily.    Marland Kitchen losartan-hydrochlorothiazide (HYZAAR) 100-12.5 MG tablet Take 1 tablet by mouth daily.    . Multiple Vitamin (MULTI-VITAMINS) TABS Take 1 tablet by mouth daily.    . pantoprazole (PROTONIX) 40 MG tablet Take 40 mg by mouth daily.    . Polyethylene Glycol 3350 (PEG 3350) POWD nightly.    . vitamin B-12 (CYANOCOBALAMIN) 1000 MCG tablet Take 1 tablet by mouth daily as needed.     No current facility-administered medications for this visit.   Facility-Administered Medications Ordered in Other Visits  Medication Dose Route Frequency Provider Last Rate Last Admin  . 0.9 %  sodium chloride infusion  250 mL Intravenous  Once Si Gaul, MD        SURGICAL HISTORY:  Past Surgical History:  Procedure Laterality Date  . COLONOSCOPY    . POLYPECTOMY    . PROSTATECTOMY    . TONSILLECTOMY      REVIEW OF SYSTEMS:  A comprehensive review of systems was negative except for: Constitutional: positive for fatigue   PHYSICAL EXAMINATION: General appearance: alert, cooperative, appears stated age, fatigued and no distress Head: Normocephalic, without obvious abnormality, atraumatic Neck: no adenopathy, no JVD, supple, symmetrical, trachea midline and thyroid not enlarged, symmetric, no  tenderness/mass/nodules Lymph nodes: Cervical, supraclavicular, and axillary nodes normal. Resp: clear to auscultation bilaterally Back: symmetric, no curvature. ROM normal. No CVA tenderness. Cardio: regular rate and rhythm, S1, S2 normal, no murmur, click, rub or gallop GI: soft, non-tender; bowel sounds normal; no masses,  no organomegaly Extremities: extremities normal, atraumatic, no cyanosis or edema  ECOG PERFORMANCE STATUS: 1 - Symptomatic but completely ambulatory  Blood pressure (!) 150/79, pulse 81, temperature 97.9 F (36.6 C), temperature source Tympanic, resp. rate 15, height 5\' 8"  (1.727 m), weight 171 lb 11.2 oz (77.9 kg), SpO2 100 %.  LABORATORY DATA:  Lab Results  Component Value Date   WBC 9.9 04/07/2020   HGB 10.5 (L) 04/07/2020   HCT 32.6 (L) 04/07/2020   MCV 94.5 04/07/2020   PLT 324 04/07/2020      Chemistry      Component Value Date/Time   NA 140 03/23/2020 0953   NA 141 12/08/2016 0928   K 4.0 03/23/2020 0953   K 4.5 12/08/2016 0928   CL 109 03/23/2020 0953   CO2 24 03/23/2020 0953   CO2 27 12/08/2016 0928   BUN 23 03/23/2020 0953   BUN 14.1 12/08/2016 0928   CREATININE 1.39 (H) 03/23/2020 0953   CREATININE 0.9 12/08/2016 0928      Component Value Date/Time   CALCIUM 9.2 03/23/2020 0953   CALCIUM 9.0 12/08/2016 0928   ALKPHOS 68 03/23/2020 0953   ALKPHOS 87 12/08/2016 0928   AST 26 03/23/2020 0953   AST 25 12/08/2016 0928   ALT 38 03/23/2020 0953   ALT 41 12/08/2016 0928   BILITOT 1.1 03/23/2020 0953   BILITOT 0.84 12/08/2016 0928      RADIOGRAPHIC STUDIES:  ASSESSMENT AND PLAN:  This is a very pleasant 75 years old white male with essential thrombocythemia with positive JAK-2 mutation as well as myelodysplastic syndrome. He is currently on treatment with Revlimid 5 mg by mouth daily according to the recommendation from [facility].  He is also on treatment with Aranesp every 3 weeks.  Aranesp was a change it to Procrit initially  every 2 weeks and currently on weekly basis.  He mentioned that he is feeling much better on Procrit weekly  He has been on the weekly treatment with Procrit for the last 18 months.   The patient has been tolerating this treatment well with no concerning issues. I recommended for him to continue with the Retacrit injection. He is planning to travel to [name] for the next 6 months.  He has a hematologist in [place] and he will receive his Retacrit injections there. I will see him back for follow-up visit in late June or early July when he returns back to [place]. The patient was advised to call immediately if he has any other concerning symptoms in the interval. All questions were answered. The patient knows to call the clinic with any problems, questions or concerns. We can  certainly see the patient much sooner if necessary.   Disclaimer: This note was dictated with voice recognition software. Similar sounding words can inadvertently be transcribed and may not be corrected upon review.

## 2020-04-16 ENCOUNTER — Telehealth: Payer: Self-pay

## 2020-04-16 NOTE — Telephone Encounter (Signed)
Per patient request, office notes and labs faxed to Dr. Nile Riggs at [facility]. Faxed to [phone]. Patient filled out and signed [facility] authorization for use/disclosure of protected health information.

## 2020-04-20 ENCOUNTER — Inpatient Hospital Stay: Payer: Medicare Other

## 2020-04-20 ENCOUNTER — Other Ambulatory Visit: Payer: Self-pay

## 2020-04-20 DIAGNOSIS — D473 Essential (hemorrhagic) thrombocythemia: Secondary | ICD-10-CM

## 2020-04-20 DIAGNOSIS — D461 Refractory anemia with ring sideroblasts: Secondary | ICD-10-CM | POA: Diagnosis not present

## 2020-04-20 DIAGNOSIS — D469 Myelodysplastic syndrome, unspecified: Secondary | ICD-10-CM

## 2020-04-20 LAB — CBC WITH DIFFERENTIAL (CANCER CENTER ONLY)
Abs Immature Granulocytes: 0.03 10*3/uL (ref 0.00–0.07)
Basophils Absolute: 0.2 10*3/uL — ABNORMAL HIGH (ref 0.0–0.1)
Basophils Relative: 3 %
Eosinophils Absolute: 0.6 10*3/uL — ABNORMAL HIGH (ref 0.0–0.5)
Eosinophils Relative: 7 %
HCT: 35.8 % — ABNORMAL LOW (ref 39.0–52.0)
Hemoglobin: 11.4 g/dL — ABNORMAL LOW (ref 13.0–17.0)
Immature Granulocytes: 0 %
Lymphocytes Relative: 18 %
Lymphs Abs: 1.5 10*3/uL (ref 0.7–4.0)
MCH: 30.4 pg (ref 26.0–34.0)
MCHC: 31.8 g/dL (ref 30.0–36.0)
MCV: 95.5 fL (ref 80.0–100.0)
Monocytes Absolute: 0.7 10*3/uL (ref 0.1–1.0)
Monocytes Relative: 9 %
Neutro Abs: 5.3 10*3/uL (ref 1.7–7.7)
Neutrophils Relative %: 63 %
Platelet Count: 235 10*3/uL (ref 150–400)
RBC: 3.75 MIL/uL — ABNORMAL LOW (ref 4.22–5.81)
RDW: 30.6 % — ABNORMAL HIGH (ref 11.5–15.5)
WBC Count: 8.3 10*3/uL (ref 4.0–10.5)
nRBC: 0 % (ref 0.0–0.2)

## 2020-04-20 LAB — CMP (CANCER CENTER ONLY)
ALT: 31 U/L (ref 0–44)
AST: 19 U/L (ref 15–41)
Albumin: 4.2 g/dL (ref 3.5–5.0)
Alkaline Phosphatase: 57 U/L (ref 38–126)
Anion gap: 5 (ref 5–15)
BUN: 27 mg/dL — ABNORMAL HIGH (ref 8–23)
CO2: 24 mmol/L (ref 22–32)
Calcium: 9.4 mg/dL (ref 8.9–10.3)
Chloride: 112 mmol/L — ABNORMAL HIGH (ref 98–111)
Creatinine: 1.25 mg/dL — ABNORMAL HIGH (ref 0.61–1.24)
GFR, Estimated: >60 mL/min (ref >60)
Glucose, Bld: 100 mg/dL — ABNORMAL HIGH (ref 70–99)
Potassium: 4.6 mmol/L (ref 3.5–5.1)
Sodium: 141 mmol/L (ref 135–145)
Total Bilirubin: 1.4 mg/dL — ABNORMAL HIGH (ref 0.3–1.2)
Total Protein: 7.8 g/dL (ref 6.5–8.1)

## 2020-04-20 NOTE — Progress Notes (Signed)
No injection needed today. HGB 11.4 Patient given copy of lab results

## 2020-04-21 ENCOUNTER — Ambulatory Visit (INDEPENDENT_AMBULATORY_CARE_PROVIDER_SITE_OTHER): Payer: Medicare Other | Admitting: Neurology

## 2020-04-21 ENCOUNTER — Encounter: Payer: Medicare Other | Admitting: Neurology

## 2020-04-21 DIAGNOSIS — R202 Paresthesia of skin: Secondary | ICD-10-CM

## 2020-04-21 DIAGNOSIS — Z0289 Encounter for other administrative examinations: Secondary | ICD-10-CM

## 2020-04-21 NOTE — Progress Notes (Signed)
Full Name: Roy Johnson Gender: Male MRN #:  [id number] Date of Birth: Sep 28, 1945     Visit Date: 04/21/2020 07:17 Age: 75 Years Examining Physician: Arlice Colt, MD  Referring Physician: Floyde Parkins, MD    History: [name] is a 75 year old man with pain and cramps in bilateral arms and legs.  Sometimes he has numbness in his feet.  The right side is worse than the left.  Strength was normal on examination.  Nerve conduction studies:  Bilateral median and ulnar motor responses in the right peroneal and tibial motor responses had normal distal latencies, amplitudes and conduction velocities.  The ulnar and tibial F-wave latencies were normal.  Orthodromic stimulation of the median nerves showed borderline peak latencies with mildly reduced amplitudes.  Mild slowing was noted with antidromic stimulation of both median palmar mixed sensory nerves.  Bilateral ulnar, right sural and right superficial peroneal sensory responses had normal peak latencies and amplitudes.  Electromyography: Needle EMG of selected muscles of the right arm and leg was performed.  In the right leg, there were some polyphasic motor units in the tibialis anterior, gastrocnemius and abductor hallucis muscles though the recruitment pattern was normal.  Other muscles in the leg were normal.  In the right arm, there was mild chronic denervation of the right triceps and a few polyphasic motor units in the extensor digitorum communis muscle though motor units recruited normally in that muscle.  Other muscles tested were normal.  Impression: This NCV/EMG study shows the following: 1.   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 2.   No evidence of polyneuropathy.   3.   There were no significant radiculopathies noted.   However, a few of the muscles had increased numbers of  polyphasic units and a minimal right C7 or right L5/S1 chronic radiculopathy is possible.  [name] A. Felecia Shelling, MD, PhD,  FAAN Certified in Neurology, Clinical Neurophysiology, Sleep Medicine, Pain Medicine and Neuroimaging Director, [place] [facility] [address] [place], [place] [address] [phone]       Verbal informed consent was obtained from the patient, patient was informed of potential risk of procedure, including bruising, bleeding, hematoma formation, infection, muscle weakness, muscle pain, numbness, among others.         [place]    Nerve / Sites Muscle Latency Ref. Amplitude Ref. Rel Amp Segments Distance Velocity Ref. Area    ms ms mV mV %  cm m/s m/s mVms  L Median - APB     Wrist APB 3.5 ?4.4 6.3 ?4.0 100 Wrist - APB 7   29.3     Upper arm APB 8.1  5.6  88.4 Upper arm - Wrist 23 51 ?49 26.2  R Median - APB     Wrist APB 3.8 ?4.4 6.7 ?4.0 100 Wrist - APB 7   27.0     Upper arm APB 8.1  6.4  95.6 Upper arm - Wrist 23 53 ?49 26.2  L Ulnar - ADM     Wrist ADM 2.8 ?3.3 7.8 ?6.0 100 Wrist - ADM 7   28.1     B.Elbow ADM 6.5  7.2  91.6 B.Elbow - Wrist 21 57 ?49 27.7     A.Elbow ADM 8.3  7.7  108 A.Elbow - B.Elbow 10 56 ?49 32.1         A.Elbow - Wrist      R Ulnar - ADM  Wrist ADM 3.3 ?3.3 7.4 ?6.0 100 Wrist - ADM 7   23.1     B.Elbow ADM 6.8  6.4  86.7 B.Elbow - Wrist 20 56 ?49 20.4     A.Elbow ADM 8.8  6.4  99.8 A.Elbow - B.Elbow 10 52 ?49 22.0         A.Elbow - Wrist      R Peroneal - EDB     Ankle EDB 5.7 ?6.5 4.0 ?2.0 100 Ankle - EDB 9   15.4     Fib head EDB 12.3  3.5  87.5 Fib head - Ankle 29 44 ?44 14.8     Pop fossa EDB 14.6  2.6  74.3 Pop fossa - Fib head 10 44 ?44 10.5         Pop fossa - Ankle      R Tibial - AH     Ankle AH 4.0 ?5.8 9.9 ?4.0 100 Ankle - AH 9   21.3     Pop fossa AH 13.2  7.0  70.6 Pop fossa - Ankle 40 43 ?41 17.5                    SNC    Nerve / Sites Rec. Site Peak Lat Ref.  Amp Ref. Segments Distance Peak Diff Ref.    ms ms V V  cm ms ms  R Sural - Ankle  (Calf)     Calf Ankle 4.1 ?4.4 9 ?6 Calf - Ankle 14    R Superficial peroneal - Ankle     Lat leg Ankle 4.0 ?4.4 7 ?6 Lat leg - Ankle 14    L Median, Ulnar - Transcarpal comparison     Median Palm Wrist 2.4 ?2.2 51 ?35 Median Palm - Wrist 8       Ulnar Palm Wrist 2.2 ?2.2 17 ?12 Ulnar Palm - Wrist 8          Median Palm - Ulnar Palm  0.3 ?0.4  R Median, Ulnar - Transcarpal comparison     Median Palm Wrist 2.5 ?2.2 76 ?35 Median Palm - Wrist 8       Ulnar Palm Wrist 2.6 ?2.2 17 ?12 Ulnar Palm - Wrist 8          Median Palm - Ulnar Palm  -0.1 ?0.4  L Median - Orthodromic (Dig II, Mid palm)     Dig II Wrist 3.3 ?3.4 4 ?10 Dig II - Wrist 13    R Median - Orthodromic (Dig II, Mid palm)     Dig II Wrist 3.4 ?3.4 6 ?10 Dig II - Wrist 13    L Ulnar - Orthodromic, (Dig V, Mid palm)     Dig V Wrist 3.0 ?3.1 6 ?5 Dig V - Wrist 11    R Ulnar - Orthodromic, (Dig V, Mid palm)     Dig V Wrist 3.1 ?3.1 5 ?5 Dig V - Wrist 42                       F  Wave    Nerve F Lat Ref.   ms ms  L Ulnar - ADM 29.8 ?32.0  R Ulnar - ADM 31.6 ?32.0  R Tibial - AH 55.0 ?56.0           EMG Summary Table    Spontaneous MUAP Recruitment  Muscle IA Fib PSW Fasc Other Amp Dur. Poly Pattern  R. Vastus medialis Normal None  None None _______ Normal Normal Normal Normal  R. Tibialis anterior Normal None None None _______ Normal Normal 1+ Normal  R. Peroneus longus Normal None None None _______ Normal Normal Normal Normal  R. Gastrocnemius (Medial head) Normal None None None _______ Normal Normal 1+ Normal  R. Abductor hallucis Normal None None None _______ Normal Normal 1+ Normal  R. Gluteus medius Normal None None None _______ Normal Normal Normal Normal  R. Iliopsoas Normal None None None _______ Normal Normal Normal Normal  R. Deltoid Normal None None None _______ Normal Normal Normal Normal  R. Triceps brachii Normal None None None _______ Normal Normal 1+ Reduced  R. Biceps brachii Normal None None None _______  Normal Normal Normal Normal  R. Extensor digitorum communis Normal None None None _______ Normal Normal 1+ Normal  R. First dorsal interosseous Normal None None None _______ Normal Normal Normal Normal  R. Abductor pollicis brevis Normal None None None _______ Normal Normal Normal Normal

## 2020-04-27 ENCOUNTER — Other Ambulatory Visit: Payer: Self-pay

## 2020-04-27 ENCOUNTER — Encounter: Payer: Self-pay | Admitting: Internal Medicine

## 2020-04-27 ENCOUNTER — Inpatient Hospital Stay: Payer: Medicare Other

## 2020-04-27 ENCOUNTER — Other Ambulatory Visit: Payer: Self-pay | Admitting: Medical Oncology

## 2020-04-27 VITALS — BP 143/73 | HR 84 | Temp 97.7°F | Resp 18

## 2020-04-27 DIAGNOSIS — D461 Refractory anemia with ring sideroblasts: Secondary | ICD-10-CM | POA: Diagnosis not present

## 2020-04-27 DIAGNOSIS — D469 Myelodysplastic syndrome, unspecified: Secondary | ICD-10-CM

## 2020-04-27 DIAGNOSIS — D473 Essential (hemorrhagic) thrombocythemia: Secondary | ICD-10-CM

## 2020-04-27 LAB — CBC WITH DIFFERENTIAL (CANCER CENTER ONLY)
Abs Immature Granulocytes: 0.02 10*3/uL (ref 0.00–0.07)
Basophils Absolute: 0.2 10*3/uL — ABNORMAL HIGH (ref 0.0–0.1)
Basophils Relative: 2 %
Eosinophils Absolute: 0.4 10*3/uL (ref 0.0–0.5)
Eosinophils Relative: 5 %
HCT: 33.5 % — ABNORMAL LOW (ref 39.0–52.0)
Hemoglobin: 10.7 g/dL — ABNORMAL LOW (ref 13.0–17.0)
Immature Granulocytes: 0 %
Lymphocytes Relative: 18 %
Lymphs Abs: 1.3 10*3/uL (ref 0.7–4.0)
MCH: 30.1 pg (ref 26.0–34.0)
MCHC: 31.9 g/dL (ref 30.0–36.0)
MCV: 94.4 fL (ref 80.0–100.0)
Monocytes Absolute: 0.5 10*3/uL (ref 0.1–1.0)
Monocytes Relative: 7 %
Neutro Abs: 4.9 10*3/uL (ref 1.7–7.7)
Neutrophils Relative %: 68 %
Platelet Count: 312 10*3/uL (ref 150–400)
RBC: 3.55 MIL/uL — ABNORMAL LOW (ref 4.22–5.81)
RDW: 30.7 % — ABNORMAL HIGH (ref 11.5–15.5)
WBC Count: 7.2 10*3/uL (ref 4.0–10.5)
nRBC: 0 % (ref 0.0–0.2)

## 2020-04-27 MED ORDER — EPOETIN ALFA-EPBX 40000 UNIT/ML IJ SOLN
INTRAMUSCULAR | Status: AC
  Filled 2020-04-27: qty 1

## 2020-04-27 MED ORDER — EPOETIN ALFA-EPBX 40000 UNIT/ML IJ SOLN
40000.0000 [IU] | Freq: Once | INTRAMUSCULAR | Status: AC
  Administered 2020-04-27: 40000 [IU] via SUBCUTANEOUS

## 2020-04-27 NOTE — Patient Instructions (Signed)

## 2020-04-28 ENCOUNTER — Telehealth: Payer: Self-pay | Admitting: Internal Medicine

## 2020-04-28 ENCOUNTER — Telehealth: Payer: Self-pay | Admitting: Neurology

## 2020-04-28 NOTE — Telephone Encounter (Signed)
I called the patient.  The EMG nerve conduction study did not show an overt neuropathy.  No clear evidence of definite carpal tunnel syndrome is seen.  The patient had a slightly low copper level, he is to go on a multivitamin with at least 2 mg of copper.  If his paresthesias persist, we will consider MRI of the cervical spine in the future.  The patient tends to go to [place] during the wintertime, may not be back until April or May.

## 2020-04-28 NOTE — Telephone Encounter (Signed)
Scheduled follow-up appointments per 1/17 schedule message. Patient is aware.

## 2020-05-04 ENCOUNTER — Inpatient Hospital Stay: Payer: Medicare Other

## 2020-05-04 ENCOUNTER — Other Ambulatory Visit: Payer: Self-pay

## 2020-05-04 VITALS — BP 149/79 | HR 73 | Temp 98.0°F | Resp 18

## 2020-05-04 DIAGNOSIS — D469 Myelodysplastic syndrome, unspecified: Secondary | ICD-10-CM

## 2020-05-04 DIAGNOSIS — D473 Essential (hemorrhagic) thrombocythemia: Secondary | ICD-10-CM

## 2020-05-04 DIAGNOSIS — D461 Refractory anemia with ring sideroblasts: Secondary | ICD-10-CM | POA: Diagnosis not present

## 2020-05-04 LAB — CBC WITH DIFFERENTIAL (CANCER CENTER ONLY)
Abs Immature Granulocytes: 1.02 10*3/uL — ABNORMAL HIGH (ref 0.00–0.07)
Basophils Absolute: 0.2 10*3/uL — ABNORMAL HIGH (ref 0.0–0.1)
Basophils Relative: 1 %
Eosinophils Absolute: 0.9 10*3/uL — ABNORMAL HIGH (ref 0.0–0.5)
Eosinophils Relative: 6 %
HCT: 32 % — ABNORMAL LOW (ref 39.0–52.0)
Hemoglobin: 10.4 g/dL — ABNORMAL LOW (ref 13.0–17.0)
Immature Granulocytes: 7 %
Lymphocytes Relative: 14 %
Lymphs Abs: 1.9 10*3/uL (ref 0.7–4.0)
MCH: 30 pg (ref 26.0–34.0)
MCHC: 32.5 g/dL (ref 30.0–36.0)
MCV: 92.2 fL (ref 80.0–100.0)
Monocytes Absolute: 0.7 10*3/uL (ref 0.1–1.0)
Monocytes Relative: 5 %
Neutro Abs: 9.3 10*3/uL — ABNORMAL HIGH (ref 1.7–7.7)
Neutrophils Relative %: 67 %
Platelet Count: 467 10*3/uL — ABNORMAL HIGH (ref 150–400)
RBC: 3.47 MIL/uL — ABNORMAL LOW (ref 4.22–5.81)
RDW: 31.5 % — ABNORMAL HIGH (ref 11.5–15.5)
WBC Count: 14 10*3/uL — ABNORMAL HIGH (ref 4.0–10.5)
nRBC: 0.1 % (ref 0.0–0.2)

## 2020-05-04 MED ORDER — EPOETIN ALFA-EPBX 40000 UNIT/ML IJ SOLN
40000.0000 [IU] | Freq: Once | INTRAMUSCULAR | Status: AC
  Administered 2020-05-04: 40000 [IU] via SUBCUTANEOUS

## 2020-05-04 MED ORDER — EPOETIN ALFA-EPBX 40000 UNIT/ML IJ SOLN
INTRAMUSCULAR | Status: AC
  Filled 2020-05-04: qty 1

## 2020-05-04 MED ORDER — EPOETIN ALFA-EPBX 10000 UNIT/ML IJ SOLN
INTRAMUSCULAR | Status: AC
  Filled 2020-05-04: qty 1

## 2020-05-04 NOTE — Patient Instructions (Signed)

## 2020-05-11 ENCOUNTER — Inpatient Hospital Stay: Payer: Medicare Other

## 2020-05-18 ENCOUNTER — Ambulatory Visit: Payer: Medicare Other

## 2020-05-18 ENCOUNTER — Other Ambulatory Visit: Payer: Medicare Other

## 2020-05-25 ENCOUNTER — Other Ambulatory Visit: Payer: Medicare Other

## 2020-05-25 ENCOUNTER — Ambulatory Visit: Payer: Medicare Other

## 2020-08-10 ENCOUNTER — Telehealth: Payer: Self-pay | Admitting: Internal Medicine

## 2020-08-10 ENCOUNTER — Telehealth: Payer: Self-pay | Admitting: *Deleted

## 2020-08-10 NOTE — Telephone Encounter (Signed)
Message to scheduler to make lab/injection on 08/17/20 @0900 , then to have lab/injection/Dr [place] [facility] 5/16 @ 0900.   Pt notified

## 2020-08-10 NOTE — Telephone Encounter (Signed)
Roy Johnson states he is back from [place] and would like to restart his weekly lab and injection appts. Would like to restart on Monday, 08/17/20 @ 0900.  Please advise.

## 2020-08-10 NOTE — Telephone Encounter (Signed)
Scheduled appts per 5/2 sch msg. Pt aware.  

## 2020-08-17 ENCOUNTER — Inpatient Hospital Stay: Payer: Medicare Other | Attending: Physician Assistant

## 2020-08-17 ENCOUNTER — Inpatient Hospital Stay: Payer: Medicare Other

## 2020-08-17 ENCOUNTER — Other Ambulatory Visit: Payer: Self-pay

## 2020-08-17 ENCOUNTER — Other Ambulatory Visit: Payer: Self-pay | Admitting: Medical Oncology

## 2020-08-17 DIAGNOSIS — R5383 Other fatigue: Secondary | ICD-10-CM | POA: Diagnosis not present

## 2020-08-17 DIAGNOSIS — R61 Generalized hyperhidrosis: Secondary | ICD-10-CM | POA: Diagnosis not present

## 2020-08-17 DIAGNOSIS — Z79899 Other long term (current) drug therapy: Secondary | ICD-10-CM | POA: Diagnosis not present

## 2020-08-17 DIAGNOSIS — D75839 Thrombocytosis, unspecified: Secondary | ICD-10-CM | POA: Diagnosis present

## 2020-08-17 DIAGNOSIS — D473 Essential (hemorrhagic) thrombocythemia: Secondary | ICD-10-CM | POA: Diagnosis not present

## 2020-08-17 DIAGNOSIS — D469 Myelodysplastic syndrome, unspecified: Secondary | ICD-10-CM

## 2020-08-17 DIAGNOSIS — D461 Refractory anemia with ring sideroblasts: Secondary | ICD-10-CM | POA: Diagnosis present

## 2020-08-17 DIAGNOSIS — R197 Diarrhea, unspecified: Secondary | ICD-10-CM | POA: Insufficient documentation

## 2020-08-17 DIAGNOSIS — R110 Nausea: Secondary | ICD-10-CM | POA: Diagnosis not present

## 2020-08-17 LAB — CBC WITH DIFFERENTIAL (CANCER CENTER ONLY)
Abs Immature Granulocytes: 1.03 10*3/uL — ABNORMAL HIGH (ref 0.00–0.07)
Basophils Absolute: 0.2 10*3/uL — ABNORMAL HIGH (ref 0.0–0.1)
Basophils Relative: 1 %
Eosinophils Absolute: 0.9 10*3/uL — ABNORMAL HIGH (ref 0.0–0.5)
Eosinophils Relative: 6 %
HCT: 35.5 % — ABNORMAL LOW (ref 39.0–52.0)
Hemoglobin: 11.1 g/dL — ABNORMAL LOW (ref 13.0–17.0)
Immature Granulocytes: 7 %
Lymphocytes Relative: 10 %
Lymphs Abs: 1.4 10*3/uL (ref 0.7–4.0)
MCH: 28 pg (ref 26.0–34.0)
MCHC: 31.3 g/dL (ref 30.0–36.0)
MCV: 89.4 fL (ref 80.0–100.0)
Monocytes Absolute: 0.6 10*3/uL (ref 0.1–1.0)
Monocytes Relative: 4 %
Neutro Abs: 11 10*3/uL — ABNORMAL HIGH (ref 1.7–7.7)
Neutrophils Relative %: 72 %
Platelet Count: 343 10*3/uL (ref 150–400)
RBC: 3.97 MIL/uL — ABNORMAL LOW (ref 4.22–5.81)
RDW: 32.1 % — ABNORMAL HIGH (ref 11.5–15.5)
WBC Count: 15 10*3/uL — ABNORMAL HIGH (ref 4.0–10.5)
nRBC: 0.3 % — ABNORMAL HIGH (ref 0.0–0.2)

## 2020-08-17 NOTE — Progress Notes (Signed)
Parameters not met for retacrit injection today. hgb 11.1. Pt received copy of lab work.

## 2020-08-21 NOTE — Progress Notes (Signed)
[place] [place] OFFICE PROGRESS NOTE  [name], MD [address] [place]  [address]  DIAGNOSIS:  1.  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 Essential thrombocythemia with positive JAK2 mutation 3. Leukocytosis.   PRIOR THERAPY: Previous treatment with combination of Hydrea and anagrelide.  CURRENT THERAPY: 1) Revlimid 5 mg by mouth daily.  Started 03/25/2016.  Changed to 5 mg daily 2 weeks on and 2 weeks off by Dr. [facility]velene [facility]on at [facility] 2) anagrelide 1 mg by mouth daily.  3) Aranesp 300 mcg subcutaneously every 3 weeks.  This is switched to Procrit 400 mcg subcutaneously every 2 weeks starting April 13, 2017.  The frequency of the Procrit was changed to weekly schedule when he was in [place].  He is currently on Retacrit weekly as a biosimilar to Procrit.  INTERVAL HISTORY: Roy Johnson 75 y.o. male returns to the clinic today for a follow-up visit.  The patient recently returned from [place].  He receives Retacrit injections weekly if his labs permit.  He is taking anagrelide 1 mg total daily and Revlimid 5 mg p.o. daily for 2 weeks on and 1 week off as recommended by Dr. Evelene Croon at [facility]. He is scheduled to see [facility] later this month.  He is feeling fairly well today except for his usual fatigue and brain fog.  He denies any fevers, chills, or unexplained weight loss recently. Over the course of the last several months he lost about 5 lbs total per patient report. He attributes this to occasional nausea and decreased appetite. He has night sweats every once in awhile but not consistently. He reports some nasal pressure/congestion which he thinks may be related to allergies. He is prescribed medication for allergies but does not take it because he does not like taking extra medications. He denies any signs and symptoms of infection including sore throat, cough, skin infections, or dysuria. He has chronic  diarrhea which is unchanged and he knows how to manage with imodium.  He denies any abnormal bleeding or bruising.  The patient is here today for evaluation and repeat blood work.  MEDICAL HISTORY: Past Medical History:  Diagnosis Date  . Anemia   . Colon polyp   . Diverticulitis   . Diverticulosis   . GERD (gastroesophageal reflux disease)   . Hyperlipidemia   . IBS (irritable bowel syndrome)   . Inguinal hernia   . 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  . Numbness and tingling in both hands   . Prostate cancer ([place])                . Prostate cancer ([place])   . Umbilical hernia   . Vitamin D deficiency     ALLERGIES:  has No Known Allergies.  MEDICATIONS:  Current Outpatient Medications  Medication Sig Dispense Refill  . amLODipine (NORVASC) 5 MG tablet Take 5 mg by mouth daily.  2  . anagrelide (AGRYLIN) 1 MG capsule TAKE 1 CAPSULE(1 MG) BY MOUTH DAILY 90 capsule 0  . cetirizine (ZYRTEC) 10 MG tablet TAKE 1 TABLET(10 MG) BY MOUTH DAILY    . epoetin alfa-epbx (RETACRIT) 41740 UNIT/ML injection Inject into the skin.    Marland Kitchen gabapentin (NEURONTIN) 100 MG capsule TAKE 1 CAPSULE BY MOUTH TWICE DAILY AND 2 AT NIGHT 120 capsule 1  . hyoscyamine (LEVSIN SL) 0.125 MG SL tablet Place 1 tablet (0.125 mg total) under the tongue every 4 (four) hours as needed. 90 tablet 3  . lenalidomide (  REVLIMID) 5 MG capsule Take 5 mg by mouth daily.    . Multiple Vitamin (MULTI-VITAMINS) TABS Take 1 tablet by mouth daily.    . pantoprazole (PROTONIX) 40 MG tablet Take 40 mg by mouth daily.    . vitamin B-12 (CYANOCOBALAMIN) 1000 MCG tablet Take 1 tablet by mouth daily as needed.     No current facility-administered medications for this visit.   Facility-Administered Medications Ordered in Other Visits  Medication Dose Route Frequency Provider Last Rate Last Admin  . 0.9 %  sodium chloride infusion  250 mL Intravenous Once Curt Bears, MD        SURGICAL HISTORY:  Past Surgical History:  Procedure  Laterality Date  . COLONOSCOPY    . POLYPECTOMY    . PROSTATECTOMY    . TONSILLECTOMY      REVIEW OF SYSTEMS:   Review of Systems  Constitutional: Positive for fatigue, decreased appetite, and 5 lb weight loss since December 2021. Negative for chills and fever. HENT: Negative for mouth sores, nosebleeds, sore throat and trouble swallowing.   Eyes: Negative for eye problems and icterus.  Respiratory: Negative for cough, hemoptysis, shortness of breath and wheezing.   Cardiovascular: Negative for chest pain and leg swelling.  Gastrointestinal: Positive for chronic diarrhea. Negative for abdominal pain, constipation, nausea and vomiting.  Genitourinary: Negative for bladder incontinence, difficulty urinating, dysuria, frequency and hematuria.   Musculoskeletal: Negative for back pain, gait problem, neck pain and neck stiffness.  Skin: Negative for itching and rash.  Neurological: Negative for dizziness, extremity weakness, gait problem, headaches, light-headedness and seizures.  Hematological: Negative for adenopathy. Does not bruise/bleed easily.  Psychiatric/Behavioral: Negative for confusion, depression and sleep disturbance. The patient is not nervous/anxious.     PHYSICAL EXAMINATION:  Blood pressure 135/75, pulse 77, temperature 97.7 F (36.5 C), temperature source Tympanic, resp. rate 17, height 5\' 8"  (1.727 m), weight 165 lb 14.4 oz (75.3 kg), SpO2 100 %.  ECOG PERFORMANCE STATUS: 1 - Symptomatic but completely ambulatory  Physical Exam  Constitutional: Oriented to person, place, and time and well-developed, well-nourished, and in no distress.  HENT:  Head: Normocephalic and atraumatic.  Mouth/Throat: Oropharynx is clear and moist. No oropharyngeal exudate.  Eyes: Conjunctivae are normal. Right eye exhibits no discharge. Left eye exhibits no discharge. No scleral icterus.  Neck: Normal range of motion. Neck supple.  Cardiovascular: Normal rate, regular rhythm, normal heart  sounds and intact distal pulses.   Pulmonary/Chest: Effort normal and breath sounds normal. No respiratory distress. No wheezes. No rales.  Abdominal: Soft. Bowel sounds are normal. Exhibits no distension and no mass. There is no tenderness.  Musculoskeletal: Normal range of motion. Exhibits no edema.  Lymphadenopathy:    No cervical adenopathy.  Neurological: Alert and oriented to person, place, and time. Exhibits normal muscle tone. Gait normal. Coordination normal.  Skin: Skin is warm and dry. No rash noted. Not diaphoretic. No erythema. No pallor.  Psychiatric: Mood, memory and judgment normal.  Vitals reviewed.  LABORATORY DATA: Lab Results  Component Value Date   WBC 10.0 08/24/2020   HGB 10.9 (L) 08/24/2020   HCT 33.0 (L) 08/24/2020   MCV 86.8 08/24/2020   PLT 220 08/24/2020      Chemistry      Component Value Date/Time   NA 141 08/24/2020 1043   NA 141 12/08/2016 0928   K 4.4 08/24/2020 1043   K 4.5 12/08/2016 0928   CL 107 08/24/2020 1043   CO2 26 08/24/2020 1043  CO2 27 12/08/2016 0928   BUN 30 (H) 08/24/2020 1043   BUN 14.1 12/08/2016 0928   CREATININE 1.41 (H) 08/24/2020 1043   CREATININE 0.9 12/08/2016 0928      Component Value Date/Time   CALCIUM 9.1 08/24/2020 1043   CALCIUM 9.0 12/08/2016 0928   ALKPHOS 61 08/24/2020 1043   ALKPHOS 87 12/08/2016 0928   AST 19 08/24/2020 1043   AST 25 12/08/2016 0928   ALT 22 08/24/2020 1043   ALT 41 12/08/2016 0928   BILITOT 0.9 08/24/2020 1043   BILITOT 0.84 12/08/2016 0928       RADIOGRAPHIC STUDIES:  No results found.   ASSESSMENT/PLAN:  This is a very pleasant 75 year old Caucasian male with essential thrombocythemia with positive JAK2 mutation as well as myelodysplastic syndrome.  The patient is currently on treatment with Revlimid 5 mg by mouth daily 2 weeks on and 1 week off as recommended by [facility].  The patient was on treatment with Aranesp every 3 weeks but this was changed to Procrit  which is given on a weekly basis at this time.  He also is on anagrelide 1 mg daily per patient report. He is scheduled to see [facility] later this month.  I reviewed the patient's labs with Dr. Julien Nordmann. Recommend that he continue on the same treatment for now. He will receive procrit today as scheduled.   I will arrange for weekly labs and injection. We will see him for a follow up visit in 3 months for evaluation and repeat labs.   The patient was advised to call immediately if he has any concerning symptoms in the interval. The patient voices understanding of current disease status and treatment options and is in agreement with the current care plan. All questions were answered. The patient knows to call the clinic with any problems, questions or concerns. We can certainly see the patient much sooner if necessary      No orders of the defined types were placed in this encounter.    I spent 20-29 minutes in this encounter.   Cyree Chuong L Samirah Scarpati, PA-C 08/24/20

## 2020-08-24 ENCOUNTER — Other Ambulatory Visit: Payer: Self-pay

## 2020-08-24 ENCOUNTER — Inpatient Hospital Stay: Payer: Medicare Other

## 2020-08-24 ENCOUNTER — Inpatient Hospital Stay (HOSPITAL_BASED_OUTPATIENT_CLINIC_OR_DEPARTMENT_OTHER): Payer: Medicare Other | Admitting: Physician Assistant

## 2020-08-24 ENCOUNTER — Encounter: Payer: Self-pay | Admitting: Physician Assistant

## 2020-08-24 VITALS — BP 135/75 | HR 77 | Temp 97.7°F | Resp 17 | Ht 68.0 in | Wt 165.9 lb

## 2020-08-24 DIAGNOSIS — D469 Myelodysplastic syndrome, unspecified: Secondary | ICD-10-CM

## 2020-08-24 LAB — CMP (CANCER CENTER ONLY)
ALT: 22 U/L (ref 0–44)
AST: 19 U/L (ref 15–41)
Albumin: 4.1 g/dL (ref 3.5–5.0)
Alkaline Phosphatase: 61 U/L (ref 38–126)
Anion gap: 8 (ref 5–15)
BUN: 30 mg/dL — ABNORMAL HIGH (ref 8–23)
CO2: 26 mmol/L (ref 22–32)
Calcium: 9.1 mg/dL (ref 8.9–10.3)
Chloride: 107 mmol/L (ref 98–111)
Creatinine: 1.41 mg/dL — ABNORMAL HIGH (ref 0.61–1.24)
GFR, Estimated: 52 mL/min — ABNORMAL LOW (ref >60)
Glucose, Bld: 126 mg/dL — ABNORMAL HIGH (ref 70–99)
Potassium: 4.4 mmol/L (ref 3.5–5.1)
Sodium: 141 mmol/L (ref 135–145)
Total Bilirubin: 0.9 mg/dL (ref 0.3–1.2)
Total Protein: 7.1 g/dL (ref 6.5–8.1)

## 2020-08-24 LAB — CBC WITH DIFFERENTIAL (CANCER CENTER ONLY)
Abs Immature Granulocytes: 0.07 10*3/uL (ref 0.00–0.07)
Basophils Absolute: 0.1 10*3/uL (ref 0.0–0.1)
Basophils Relative: 1 %
Eosinophils Absolute: 0.9 10*3/uL — ABNORMAL HIGH (ref 0.0–0.5)
Eosinophils Relative: 9 %
HCT: 33 % — ABNORMAL LOW (ref 39.0–52.0)
Hemoglobin: 10.9 g/dL — ABNORMAL LOW (ref 13.0–17.0)
Immature Granulocytes: 1 %
Lymphocytes Relative: 13 %
Lymphs Abs: 1.3 10*3/uL (ref 0.7–4.0)
MCH: 28.7 pg (ref 26.0–34.0)
MCHC: 33 g/dL (ref 30.0–36.0)
MCV: 86.8 fL (ref 80.0–100.0)
Monocytes Absolute: 1.1 10*3/uL — ABNORMAL HIGH (ref 0.1–1.0)
Monocytes Relative: 11 %
Neutro Abs: 6.5 10*3/uL (ref 1.7–7.7)
Neutrophils Relative %: 65 %
Platelet Count: 220 10*3/uL (ref 150–400)
RBC: 3.8 MIL/uL — ABNORMAL LOW (ref 4.22–5.81)
RDW: 31.4 % — ABNORMAL HIGH (ref 11.5–15.5)
WBC Count: 10 10*3/uL (ref 4.0–10.5)
nRBC: 0 % (ref 0.0–0.2)

## 2020-08-24 MED ORDER — EPOETIN ALFA-EPBX 40000 UNIT/ML IJ SOLN
40000.0000 [IU] | Freq: Once | INTRAMUSCULAR | Status: AC
  Administered 2020-08-24: 40000 [IU] via SUBCUTANEOUS

## 2020-08-24 MED ORDER — EPOETIN ALFA-EPBX 40000 UNIT/ML IJ SOLN
INTRAMUSCULAR | Status: AC
  Filled 2020-08-24: qty 1

## 2020-08-24 NOTE — Patient Instructions (Signed)

## 2020-08-25 ENCOUNTER — Telehealth: Payer: Self-pay | Admitting: Physician Assistant

## 2020-08-25 NOTE — Telephone Encounter (Signed)
Scheduled per los. Spoke with patient in person and scheduled all appts. Declined printout

## 2020-09-01 ENCOUNTER — Other Ambulatory Visit: Payer: Self-pay | Admitting: Medical Oncology

## 2020-09-01 ENCOUNTER — Other Ambulatory Visit: Payer: Self-pay

## 2020-09-01 ENCOUNTER — Inpatient Hospital Stay: Payer: Medicare Other

## 2020-09-01 ENCOUNTER — Telehealth: Payer: Self-pay

## 2020-09-01 DIAGNOSIS — D469 Myelodysplastic syndrome, unspecified: Secondary | ICD-10-CM

## 2020-09-01 LAB — CBC WITH DIFFERENTIAL (CANCER CENTER ONLY)
Abs Immature Granulocytes: 0.06 10*3/uL (ref 0.00–0.07)
Basophils Absolute: 0.2 10*3/uL — ABNORMAL HIGH (ref 0.0–0.1)
Basophils Relative: 2 %
Eosinophils Absolute: 0.4 10*3/uL (ref 0.0–0.5)
Eosinophils Relative: 5 %
HCT: 33.7 % — ABNORMAL LOW (ref 39.0–52.0)
Hemoglobin: 11 g/dL — ABNORMAL LOW (ref 13.0–17.0)
Immature Granulocytes: 1 %
Lymphocytes Relative: 20 %
Lymphs Abs: 1.7 10*3/uL (ref 0.7–4.0)
MCH: 28.1 pg (ref 26.0–34.0)
MCHC: 32.6 g/dL (ref 30.0–36.0)
MCV: 86.2 fL (ref 80.0–100.0)
Monocytes Absolute: 0.8 10*3/uL (ref 0.1–1.0)
Monocytes Relative: 9 %
Neutro Abs: 5.2 10*3/uL (ref 1.7–7.7)
Neutrophils Relative %: 63 %
Platelet Count: 274 10*3/uL (ref 150–400)
RBC: 3.91 MIL/uL — ABNORMAL LOW (ref 4.22–5.81)
RDW: 32.3 % — ABNORMAL HIGH (ref 11.5–15.5)
WBC Count: 8.3 10*3/uL (ref 4.0–10.5)
nRBC: 0 % (ref 0.0–0.2)

## 2020-09-01 NOTE — Progress Notes (Signed)
Per paraments pt will not receive injection due to hgb of 11. Pt given copy of labs.

## 2020-09-01 NOTE — Telephone Encounter (Signed)
Pt wants Dr. Julien Nordmann to request weekly CMP so he can report his creatinine and BUN results to his PCP, Dr. [name].

## 2020-09-02 ENCOUNTER — Other Ambulatory Visit: Payer: Self-pay

## 2020-09-02 DIAGNOSIS — D469 Myelodysplastic syndrome, unspecified: Secondary | ICD-10-CM

## 2020-09-08 ENCOUNTER — Other Ambulatory Visit: Payer: Self-pay

## 2020-09-08 ENCOUNTER — Inpatient Hospital Stay: Payer: Medicare Other

## 2020-09-08 VITALS — BP 153/87 | HR 76 | Temp 98.4°F | Resp 20

## 2020-09-08 DIAGNOSIS — D469 Myelodysplastic syndrome, unspecified: Secondary | ICD-10-CM

## 2020-09-08 LAB — CMP (CANCER CENTER ONLY)
ALT: 17 U/L (ref 0–44)
AST: 16 U/L (ref 15–41)
Albumin: 4.1 g/dL (ref 3.5–5.0)
Alkaline Phosphatase: 64 U/L (ref 38–126)
Anion gap: 9 (ref 5–15)
BUN: 22 mg/dL (ref 8–23)
CO2: 24 mmol/L (ref 22–32)
Calcium: 9.4 mg/dL (ref 8.9–10.3)
Chloride: 109 mmol/L (ref 98–111)
Creatinine: 1.28 mg/dL — ABNORMAL HIGH (ref 0.61–1.24)
GFR, Estimated: 59 mL/min — ABNORMAL LOW (ref >60)
Glucose, Bld: 128 mg/dL — ABNORMAL HIGH (ref 70–99)
Potassium: 4.6 mmol/L (ref 3.5–5.1)
Sodium: 142 mmol/L (ref 135–145)
Total Bilirubin: 0.6 mg/dL (ref 0.3–1.2)
Total Protein: 7.2 g/dL (ref 6.5–8.1)

## 2020-09-08 LAB — CBC WITH DIFFERENTIAL (CANCER CENTER ONLY)
Abs Immature Granulocytes: 0.18 10*3/uL — ABNORMAL HIGH (ref 0.00–0.07)
Basophils Absolute: 0.3 10*3/uL — ABNORMAL HIGH (ref 0.0–0.1)
Basophils Relative: 3 %
Eosinophils Absolute: 0.4 10*3/uL (ref 0.0–0.5)
Eosinophils Relative: 4 %
HCT: 32.6 % — ABNORMAL LOW (ref 39.0–52.0)
Hemoglobin: 10.4 g/dL — ABNORMAL LOW (ref 13.0–17.0)
Immature Granulocytes: 2 %
Lymphocytes Relative: 17 %
Lymphs Abs: 1.9 10*3/uL (ref 0.7–4.0)
MCH: 28 pg (ref 26.0–34.0)
MCHC: 31.9 g/dL (ref 30.0–36.0)
MCV: 87.9 fL (ref 80.0–100.0)
Monocytes Absolute: 0.6 10*3/uL (ref 0.1–1.0)
Monocytes Relative: 6 %
Neutro Abs: 7.9 10*3/uL — ABNORMAL HIGH (ref 1.7–7.7)
Neutrophils Relative %: 68 %
Platelet Count: 454 10*3/uL — ABNORMAL HIGH (ref 150–400)
RBC: 3.71 MIL/uL — ABNORMAL LOW (ref 4.22–5.81)
RDW: 31.4 % — ABNORMAL HIGH (ref 11.5–15.5)
WBC Count: 11.3 10*3/uL — ABNORMAL HIGH (ref 4.0–10.5)
nRBC: 0.2 % (ref 0.0–0.2)

## 2020-09-08 MED ORDER — EPOETIN ALFA-EPBX 40000 UNIT/ML IJ SOLN
40000.0000 [IU] | Freq: Once | INTRAMUSCULAR | Status: AC
  Administered 2020-09-08: 40000 [IU] via SUBCUTANEOUS

## 2020-09-08 MED ORDER — EPOETIN ALFA-EPBX 40000 UNIT/ML IJ SOLN
INTRAMUSCULAR | Status: AC
  Filled 2020-09-08: qty 1

## 2020-09-14 ENCOUNTER — Other Ambulatory Visit: Payer: Self-pay

## 2020-09-14 ENCOUNTER — Inpatient Hospital Stay: Payer: Medicare Other | Attending: Physician Assistant

## 2020-09-14 ENCOUNTER — Inpatient Hospital Stay: Payer: Medicare Other

## 2020-09-14 VITALS — BP 140/68 | HR 79 | Temp 98.3°F | Resp 18

## 2020-09-14 DIAGNOSIS — Z79899 Other long term (current) drug therapy: Secondary | ICD-10-CM | POA: Diagnosis not present

## 2020-09-14 DIAGNOSIS — D469 Myelodysplastic syndrome, unspecified: Secondary | ICD-10-CM | POA: Diagnosis not present

## 2020-09-14 DIAGNOSIS — D461 Refractory anemia with ring sideroblasts: Secondary | ICD-10-CM | POA: Diagnosis present

## 2020-09-14 DIAGNOSIS — D75839 Thrombocytosis, unspecified: Secondary | ICD-10-CM | POA: Diagnosis present

## 2020-09-14 LAB — CBC WITH DIFFERENTIAL (CANCER CENTER ONLY)
Abs Immature Granulocytes: 3.05 10*3/uL — ABNORMAL HIGH (ref 0.00–0.07)
Basophils Absolute: 0.3 10*3/uL — ABNORMAL HIGH (ref 0.0–0.1)
Basophils Relative: 2 %
Eosinophils Absolute: 1 10*3/uL — ABNORMAL HIGH (ref 0.0–0.5)
Eosinophils Relative: 4 %
HCT: 29.2 % — ABNORMAL LOW (ref 39.0–52.0)
Hemoglobin: 9.5 g/dL — ABNORMAL LOW (ref 13.0–17.0)
Immature Granulocytes: 14 %
Lymphocytes Relative: 11 %
Lymphs Abs: 2.5 10*3/uL (ref 0.7–4.0)
MCH: 28.2 pg (ref 26.0–34.0)
MCHC: 32.5 g/dL (ref 30.0–36.0)
MCV: 86.6 fL (ref 80.0–100.0)
Monocytes Absolute: 1 10*3/uL (ref 0.1–1.0)
Monocytes Relative: 4 %
Neutro Abs: 14.6 10*3/uL — ABNORMAL HIGH (ref 1.7–7.7)
Neutrophils Relative %: 65 %
Platelet Count: 492 10*3/uL — ABNORMAL HIGH (ref 150–400)
RBC: 3.37 MIL/uL — ABNORMAL LOW (ref 4.22–5.81)
RDW: 32.1 % — ABNORMAL HIGH (ref 11.5–15.5)
WBC Count: 22.4 10*3/uL — ABNORMAL HIGH (ref 4.0–10.5)
nRBC: 0.5 % — ABNORMAL HIGH (ref 0.0–0.2)

## 2020-09-14 MED ORDER — EPOETIN ALFA-EPBX 40000 UNIT/ML IJ SOLN
INTRAMUSCULAR | Status: AC
  Filled 2020-09-14: qty 1

## 2020-09-14 MED ORDER — EPOETIN ALFA-EPBX 40000 UNIT/ML IJ SOLN
40000.0000 [IU] | Freq: Once | INTRAMUSCULAR | Status: AC
Start: 2020-09-14 — End: 2020-09-14
  Administered 2020-09-14: 40000 [IU] via SUBCUTANEOUS

## 2020-09-14 NOTE — Patient Instructions (Signed)

## 2020-09-21 ENCOUNTER — Inpatient Hospital Stay: Payer: Medicare Other

## 2020-09-21 ENCOUNTER — Other Ambulatory Visit: Payer: Self-pay

## 2020-09-21 VITALS — BP 158/85 | HR 85 | Temp 98.9°F | Resp 18

## 2020-09-21 DIAGNOSIS — D469 Myelodysplastic syndrome, unspecified: Secondary | ICD-10-CM

## 2020-09-21 LAB — CBC WITH DIFFERENTIAL (CANCER CENTER ONLY)
Abs Immature Granulocytes: 1.66 10*3/uL — ABNORMAL HIGH (ref 0.00–0.07)
Basophils Absolute: 0.3 10*3/uL — ABNORMAL HIGH (ref 0.0–0.1)
Basophils Relative: 1 %
Eosinophils Absolute: 2.4 10*3/uL — ABNORMAL HIGH (ref 0.0–0.5)
Eosinophils Relative: 7 %
HCT: 32.8 % — ABNORMAL LOW (ref 39.0–52.0)
Hemoglobin: 10.5 g/dL — ABNORMAL LOW (ref 13.0–17.0)
Immature Granulocytes: 5 %
Lymphocytes Relative: 8 %
Lymphs Abs: 2.7 10*3/uL (ref 0.7–4.0)
MCH: 28 pg (ref 26.0–34.0)
MCHC: 32 g/dL (ref 30.0–36.0)
MCV: 87.5 fL (ref 80.0–100.0)
Monocytes Absolute: 3.8 10*3/uL — ABNORMAL HIGH (ref 0.1–1.0)
Monocytes Relative: 12 %
Neutro Abs: 21.3 10*3/uL — ABNORMAL HIGH (ref 1.7–7.7)
Neutrophils Relative %: 67 %
Platelet Count: 324 10*3/uL (ref 150–400)
RBC: 3.75 MIL/uL — ABNORMAL LOW (ref 4.22–5.81)
RDW: 32 % — ABNORMAL HIGH (ref 11.5–15.5)
WBC Count: 32 10*3/uL — ABNORMAL HIGH (ref 4.0–10.5)
nRBC: 0.3 % — ABNORMAL HIGH (ref 0.0–0.2)

## 2020-09-21 LAB — CMP (CANCER CENTER ONLY)
ALT: 26 U/L (ref 0–44)
AST: 13 U/L — ABNORMAL LOW (ref 15–41)
Albumin: 4.1 g/dL (ref 3.5–5.0)
Alkaline Phosphatase: 73 U/L (ref 38–126)
Anion gap: 9 (ref 5–15)
BUN: 24 mg/dL — ABNORMAL HIGH (ref 8–23)
CO2: 25 mmol/L (ref 22–32)
Calcium: 9 mg/dL (ref 8.9–10.3)
Chloride: 106 mmol/L (ref 98–111)
Creatinine: 1.28 mg/dL — ABNORMAL HIGH (ref 0.61–1.24)
GFR, Estimated: 59 mL/min — ABNORMAL LOW (ref >60)
Glucose, Bld: 130 mg/dL — ABNORMAL HIGH (ref 70–99)
Potassium: 4.4 mmol/L (ref 3.5–5.1)
Sodium: 140 mmol/L (ref 135–145)
Total Bilirubin: 1.1 mg/dL (ref 0.3–1.2)
Total Protein: 7.2 g/dL (ref 6.5–8.1)

## 2020-09-21 MED ORDER — EPOETIN ALFA-EPBX 40000 UNIT/ML IJ SOLN
40000.0000 [IU] | Freq: Once | INTRAMUSCULAR | Status: AC
Start: 2020-09-21 — End: 2020-09-21
  Administered 2020-09-21: 40000 [IU] via SUBCUTANEOUS

## 2020-09-21 MED ORDER — EPOETIN ALFA-EPBX 40000 UNIT/ML IJ SOLN
INTRAMUSCULAR | Status: AC
  Filled 2020-09-21: qty 1

## 2020-09-21 NOTE — Patient Instructions (Signed)

## 2020-09-28 ENCOUNTER — Inpatient Hospital Stay: Payer: Medicare Other

## 2020-09-28 ENCOUNTER — Other Ambulatory Visit: Payer: Self-pay

## 2020-09-28 VITALS — BP 156/80 | HR 72 | Temp 98.7°F | Resp 19

## 2020-09-28 DIAGNOSIS — D469 Myelodysplastic syndrome, unspecified: Secondary | ICD-10-CM | POA: Diagnosis not present

## 2020-09-28 LAB — CBC WITH DIFFERENTIAL (CANCER CENTER ONLY)
Abs Immature Granulocytes: 0.11 10*3/uL — ABNORMAL HIGH (ref 0.00–0.07)
Basophils Absolute: 0.4 10*3/uL — ABNORMAL HIGH (ref 0.0–0.1)
Basophils Relative: 3 %
Eosinophils Absolute: 0.4 10*3/uL (ref 0.0–0.5)
Eosinophils Relative: 3 %
HCT: 30.6 % — ABNORMAL LOW (ref 39.0–52.0)
Hemoglobin: 9.7 g/dL — ABNORMAL LOW (ref 13.0–17.0)
Immature Granulocytes: 1 %
Lymphocytes Relative: 12 %
Lymphs Abs: 1.6 10*3/uL (ref 0.7–4.0)
MCH: 28 pg (ref 26.0–34.0)
MCHC: 31.7 g/dL (ref 30.0–36.0)
MCV: 88.4 fL (ref 80.0–100.0)
Monocytes Absolute: 1.4 10*3/uL — ABNORMAL HIGH (ref 0.1–1.0)
Monocytes Relative: 10 %
Neutro Abs: 9.7 10*3/uL — ABNORMAL HIGH (ref 1.7–7.7)
Neutrophils Relative %: 71 %
Platelet Count: 297 10*3/uL (ref 150–400)
RBC: 3.46 MIL/uL — ABNORMAL LOW (ref 4.22–5.81)
RDW: 32.2 % — ABNORMAL HIGH (ref 11.5–15.5)
WBC Count: 13.5 10*3/uL — ABNORMAL HIGH (ref 4.0–10.5)
nRBC: 0.2 % (ref 0.0–0.2)

## 2020-09-28 LAB — CMP (CANCER CENTER ONLY)
ALT: 24 U/L (ref 0–44)
AST: 14 U/L — ABNORMAL LOW (ref 15–41)
Albumin: 4.2 g/dL (ref 3.5–5.0)
Alkaline Phosphatase: 54 U/L (ref 38–126)
Anion gap: 6 (ref 5–15)
BUN: 30 mg/dL — ABNORMAL HIGH (ref 8–23)
CO2: 26 mmol/L (ref 22–32)
Calcium: 9.3 mg/dL (ref 8.9–10.3)
Chloride: 106 mmol/L (ref 98–111)
Creatinine: 1.2 mg/dL (ref 0.61–1.24)
GFR, Estimated: >60 mL/min (ref >60)
Glucose, Bld: 127 mg/dL — ABNORMAL HIGH (ref 70–99)
Potassium: 5 mmol/L (ref 3.5–5.1)
Sodium: 138 mmol/L (ref 135–145)
Total Bilirubin: 1.1 mg/dL (ref 0.3–1.2)
Total Protein: 7 g/dL (ref 6.5–8.1)

## 2020-09-28 MED ORDER — EPOETIN ALFA-EPBX 40000 UNIT/ML IJ SOLN
40000.0000 [IU] | Freq: Once | INTRAMUSCULAR | Status: AC
Start: 2020-09-28 — End: 2020-09-28
  Administered 2020-09-28: 40000 [IU] via SUBCUTANEOUS

## 2020-09-28 MED ORDER — EPOETIN ALFA-EPBX 40000 UNIT/ML IJ SOLN
INTRAMUSCULAR | Status: AC
  Filled 2020-09-28: qty 1

## 2020-09-28 NOTE — Patient Instructions (Signed)
Epoetin Alfa injection What is this medication? EPOETIN ALFA (e POE e tin AL fa) helps your body make more red blood cells. This medicine is used to treat anemia caused by chronic kidney disease, cancer chemotherapy, or HIV-therapy. It may also be used before surgery if you have anemia. This medicine may be used for other purposes; ask your health care provider or pharmacist if you have questions. COMMON BRAND NAME(S): Epogen, Procrit, Retacrit What should I tell my care team before I take this medication? They need to know if you have any of these conditions: cancer heart disease high blood pressure history of blood clots history of stroke low levels of folate, iron, or vitamin B12 in the blood seizures an unusual or allergic reaction to erythropoietin, albumin, benzyl alcohol, hamster proteins, other medicines, foods, dyes, or preservatives pregnant or trying to get pregnant breast-feeding How should I use this medication? This medicine is for injection into a vein or under the skin. It is usually given by a health care professional in a hospital or clinic setting. If you get this medicine at home, you will be taught how to prepare and give this medicine. Use exactly as directed. Take your medicine at regular intervals. Do not take your medicine more often than directed. It is important that you put your used needles and syringes in a special sharps container. Do not put them in a trash can. If you do not have a sharps container, call your pharmacist or healthcare provider to get one. A special MedGuide will be given to you by the pharmacist with each prescription and refill. Be sure to read this information carefully each time. Talk to your pediatrician regarding the use of this medicine in children. While this drug may be prescribed for selected conditions, precautions do apply. Overdosage: If you think you have taken too much of this medicine contact a poison control center or emergency  room at once. NOTE: This medicine is only for you. Do not share this medicine with others. What if I miss a dose? If you miss a dose, take it as soon as you can. If it is almost time for your next dose, take only that dose. Do not take double or extra doses. What may interact with this medication? Interactions have not been studied. This list may not describe all possible interactions. Give your health care provider a list of all the medicines, herbs, non-prescription drugs, or dietary supplements you use. Also tell them if you smoke, drink alcohol, or use illegal drugs. Some items may interact with your medicine. What should I watch for while using this medication? Your condition will be monitored carefully while you are receiving this medicine. You may need blood work done while you are taking this medicine. This medicine may cause a decrease in vitamin B6. You should make sure that you get enough vitamin B6 while you are taking this medicine. Discuss the foods you eat and the vitamins you take with your health care professional. What side effects may I notice from receiving this medication? Side effects that you should report to your doctor or health care professional as soon as possible: allergic reactions like skin rash, itching or hives, swelling of the face, lips, or tongue seizures signs and symptoms of a blood clot such as breathing problems; changes in vision; chest pain; severe, sudden headache; pain, swelling, warmth in the leg; trouble speaking; sudden numbness or weakness of the face, arm or leg signs and symptoms of a stroke like   changes in vision; confusion; trouble speaking or understanding; severe headaches; sudden numbness or weakness of the face, arm or leg; trouble walking; dizziness; loss of balance or coordination Side effects that usually do not require medical attention (report to your doctor or health care professional if they continue or are  bothersome): chills cough dizziness fever headaches joint pain muscle cramps muscle pain nausea, vomiting pain, redness, or irritation at site where injected This list may not describe all possible side effects. Call your doctor for medical advice about side effects. You may report side effects to FDA at 1-800-FDA-1088. Where should I keep my medication? Keep out of the reach of children. Store in a refrigerator between 2 and 8 degrees C (36 and 46 degrees F). Do not freeze or shake. Throw away any unused portion if using a single-dose vial. Multi-dose vials can be kept in the refrigerator for up to 21 days after the initial dose. Throw away unused medicine. NOTE: This sheet is a summary. It may not cover all possible information. If you have questions about this medicine, talk to your doctor, pharmacist, or health care provider.  2022 Elsevier/Gold Standard (2016-11-04 08:35:19)  

## 2020-09-30 ENCOUNTER — Telehealth: Payer: Self-pay | Admitting: Neurology

## 2020-09-30 ENCOUNTER — Ambulatory Visit: Payer: Medicare Other | Admitting: Neurology

## 2020-09-30 ENCOUNTER — Encounter: Payer: Self-pay | Admitting: Neurology

## 2020-09-30 NOTE — Telephone Encounter (Signed)
This patient did not show for a revisit appointment today. 

## 2020-10-05 ENCOUNTER — Other Ambulatory Visit: Payer: Self-pay

## 2020-10-05 ENCOUNTER — Inpatient Hospital Stay: Payer: Medicare Other

## 2020-10-05 VITALS — BP 147/71 | HR 78 | Resp 18

## 2020-10-05 DIAGNOSIS — D469 Myelodysplastic syndrome, unspecified: Secondary | ICD-10-CM

## 2020-10-05 LAB — CBC WITH DIFFERENTIAL (CANCER CENTER ONLY)
Abs Immature Granulocytes: 0.7 10*3/uL — ABNORMAL HIGH (ref 0.00–0.07)
Basophils Absolute: 0.2 10*3/uL — ABNORMAL HIGH (ref 0.0–0.1)
Basophils Relative: 1 %
Eosinophils Absolute: 0.4 10*3/uL (ref 0.0–0.5)
Eosinophils Relative: 3 %
HCT: 30 % — ABNORMAL LOW (ref 39.0–52.0)
Hemoglobin: 9.6 g/dL — ABNORMAL LOW (ref 13.0–17.0)
Immature Granulocytes: 4 %
Lymphocytes Relative: 10 %
Lymphs Abs: 1.6 10*3/uL (ref 0.7–4.0)
MCH: 28.9 pg (ref 26.0–34.0)
MCHC: 32 g/dL (ref 30.0–36.0)
MCV: 90.4 fL (ref 80.0–100.0)
Monocytes Absolute: 1.1 10*3/uL — ABNORMAL HIGH (ref 0.1–1.0)
Monocytes Relative: 7 %
Neutro Abs: 12.2 10*3/uL — ABNORMAL HIGH (ref 1.7–7.7)
Neutrophils Relative %: 75 %
Platelet Count: 292 10*3/uL (ref 150–400)
RBC: 3.32 MIL/uL — ABNORMAL LOW (ref 4.22–5.81)
RDW: 33.2 % — ABNORMAL HIGH (ref 11.5–15.5)
WBC Count: 16.2 10*3/uL — ABNORMAL HIGH (ref 4.0–10.5)
nRBC: 0.9 % — ABNORMAL HIGH (ref 0.0–0.2)

## 2020-10-05 MED ORDER — EPOETIN ALFA-EPBX 40000 UNIT/ML IJ SOLN
INTRAMUSCULAR | Status: AC
  Filled 2020-10-05: qty 1

## 2020-10-05 MED ORDER — EPOETIN ALFA-EPBX 40000 UNIT/ML IJ SOLN
40000.0000 [IU] | Freq: Once | INTRAMUSCULAR | Status: AC
Start: 2020-10-05 — End: 2020-10-05
  Administered 2020-10-05: 40000 [IU] via SUBCUTANEOUS

## 2020-10-05 NOTE — Patient Instructions (Signed)
Epoetin Alfa injection What is this medication? EPOETIN ALFA (e POE e tin AL fa) helps your body make more red blood cells. This medicine is used to treat anemia caused by chronic kidney disease, cancer chemotherapy, or HIV-therapy. It may also be used before surgery if you have anemia. This medicine may be used for other purposes; ask your health care provider or pharmacist if you have questions. COMMON BRAND NAME(S): Epogen, Procrit, Retacrit What should I tell my care team before I take this medication? They need to know if you have any of these conditions: cancer heart disease high blood pressure history of blood clots history of stroke low levels of folate, iron, or vitamin B12 in the blood seizures an unusual or allergic reaction to erythropoietin, albumin, benzyl alcohol, hamster proteins, other medicines, foods, dyes, or preservatives pregnant or trying to get pregnant breast-feeding How should I use this medication? This medicine is for injection into a vein or under the skin. It is usually given by a health care professional in a hospital or clinic setting. If you get this medicine at home, you will be taught how to prepare and give this medicine. Use exactly as directed. Take your medicine at regular intervals. Do not take your medicine more often than directed. It is important that you put your used needles and syringes in a special sharps container. Do not put them in a trash can. If you do not have a sharps container, call your pharmacist or healthcare provider to get one. A special MedGuide will be given to you by the pharmacist with each prescription and refill. Be sure to read this information carefully each time. Talk to your pediatrician regarding the use of this medicine in children. While this drug may be prescribed for selected conditions, precautions do apply. Overdosage: If you think you have taken too much of this medicine contact a poison control center or emergency  room at once. NOTE: This medicine is only for you. Do not share this medicine with others. What if I miss a dose? If you miss a dose, take it as soon as you can. If it is almost time for your next dose, take only that dose. Do not take double or extra doses. What may interact with this medication? Interactions have not been studied. This list may not describe all possible interactions. Give your health care provider a list of all the medicines, herbs, non-prescription drugs, or dietary supplements you use. Also tell them if you smoke, drink alcohol, or use illegal drugs. Some items may interact with your medicine. What should I watch for while using this medication? Your condition will be monitored carefully while you are receiving this medicine. You may need blood work done while you are taking this medicine. This medicine may cause a decrease in vitamin B6. You should make sure that you get enough vitamin B6 while you are taking this medicine. Discuss the foods you eat and the vitamins you take with your health care professional. What side effects may I notice from receiving this medication? Side effects that you should report to your doctor or health care professional as soon as possible: allergic reactions like skin rash, itching or hives, swelling of the face, lips, or tongue seizures signs and symptoms of a blood clot such as breathing problems; changes in vision; chest pain; severe, sudden headache; pain, swelling, warmth in the leg; trouble speaking; sudden numbness or weakness of the face, arm or leg signs and symptoms of a stroke like   changes in vision; confusion; trouble speaking or understanding; severe headaches; sudden numbness or weakness of the face, arm or leg; trouble walking; dizziness; loss of balance or coordination Side effects that usually do not require medical attention (report to your doctor or health care professional if they continue or are  bothersome): chills cough dizziness fever headaches joint pain muscle cramps muscle pain nausea, vomiting pain, redness, or irritation at site where injected This list may not describe all possible side effects. Call your doctor for medical advice about side effects. You may report side effects to FDA at 1-800-FDA-1088. Where should I keep my medication? Keep out of the reach of children. Store in a refrigerator between 2 and 8 degrees C (36 and 46 degrees F). Do not freeze or shake. Throw away any unused portion if using a single-dose vial. Multi-dose vials can be kept in the refrigerator for up to 21 days after the initial dose. Throw away unused medicine. NOTE: This sheet is a summary. It may not cover all possible information. If you have questions about this medicine, talk to your doctor, pharmacist, or health care provider.  2022 Elsevier/Gold Standard (2016-11-04 08:35:19)  

## 2020-10-06 ENCOUNTER — Telehealth: Payer: Self-pay | Admitting: Medical Oncology

## 2020-10-06 NOTE — Telephone Encounter (Signed)
06/13-WBC elevated to 32 k so [facility] provider did BM bx  He is concerned that WBC  elevated ,declined and is now trending up.from 13.5 to 16.2. He is anxious and wants to ' figure out what's causing the rise.'

## 2020-10-07 ENCOUNTER — Telehealth: Payer: Self-pay | Admitting: Medical Oncology

## 2020-10-07 NOTE — Telephone Encounter (Addendum)
I lvm that Dr Julien Nordmann looked at [facility] report and agreed with Dr Evelene Croon and he will continue to monitor his weekly labs.   "It definitely could be part of his disease or any other inflammatory process.  I am glad he had a bone marrow biopsy and aspirate and we will wait for the results to know more.  Thank you "

## 2020-10-13 ENCOUNTER — Telehealth: Payer: Self-pay | Admitting: Medical Oncology

## 2020-10-13 ENCOUNTER — Inpatient Hospital Stay: Payer: Medicare Other

## 2020-10-13 ENCOUNTER — Inpatient Hospital Stay: Payer: Medicare Other | Attending: Physician Assistant

## 2020-10-13 ENCOUNTER — Other Ambulatory Visit: Payer: Self-pay

## 2020-10-13 DIAGNOSIS — D469 Myelodysplastic syndrome, unspecified: Secondary | ICD-10-CM

## 2020-10-13 LAB — CMP (CANCER CENTER ONLY)
ALT: 21 U/L (ref 0–44)
AST: 13 U/L — ABNORMAL LOW (ref 15–41)
Albumin: 4.1 g/dL (ref 3.5–5.0)
Alkaline Phosphatase: 77 U/L (ref 38–126)
Anion gap: 7 (ref 5–15)
BUN: 28 mg/dL — ABNORMAL HIGH (ref 8–23)
CO2: 26 mmol/L (ref 22–32)
Calcium: 9.9 mg/dL (ref 8.9–10.3)
Chloride: 107 mmol/L (ref 98–111)
Creatinine: 1.18 mg/dL (ref 0.61–1.24)
GFR, Estimated: >60 mL/min (ref >60)
Glucose, Bld: 97 mg/dL (ref 70–99)
Potassium: 4.6 mmol/L (ref 3.5–5.1)
Sodium: 140 mmol/L (ref 135–145)
Total Bilirubin: 0.9 mg/dL (ref 0.3–1.2)
Total Protein: 7.3 g/dL (ref 6.5–8.1)

## 2020-10-13 LAB — CBC WITH DIFFERENTIAL (CANCER CENTER ONLY)
Abs Immature Granulocytes: 0.87 10*3/uL — ABNORMAL HIGH (ref 0.00–0.07)
Basophils Absolute: 0.2 10*3/uL — ABNORMAL HIGH (ref 0.0–0.1)
Basophils Relative: 1 %
Eosinophils Absolute: 0.8 10*3/uL — ABNORMAL HIGH (ref 0.0–0.5)
Eosinophils Relative: 4 %
HCT: 35.7 % — ABNORMAL LOW (ref 39.0–52.0)
Hemoglobin: 11.3 g/dL — ABNORMAL LOW (ref 13.0–17.0)
Immature Granulocytes: 4 %
Lymphocytes Relative: 11 %
Lymphs Abs: 2.4 10*3/uL (ref 0.7–4.0)
MCH: 29.1 pg (ref 26.0–34.0)
MCHC: 31.7 g/dL (ref 30.0–36.0)
MCV: 92 fL (ref 80.0–100.0)
Monocytes Absolute: 1 10*3/uL (ref 0.1–1.0)
Monocytes Relative: 5 %
Neutro Abs: 15.5 10*3/uL — ABNORMAL HIGH (ref 1.7–7.7)
Neutrophils Relative %: 75 %
Platelet Count: 327 10*3/uL (ref 150–400)
RBC: 3.88 MIL/uL — ABNORMAL LOW (ref 4.22–5.81)
RDW: 33.4 % — ABNORMAL HIGH (ref 11.5–15.5)
WBC Count: 20.7 10*3/uL — ABNORMAL HIGH (ref 4.0–10.5)
nRBC: 0.4 % — ABNORMAL HIGH (ref 0.0–0.2)

## 2020-10-13 NOTE — Telephone Encounter (Signed)
WBC still trending up. " I need to stay on top of this".   Had medrol dose pack in may and 3 steroid injections in the past 4 months.  He wants to see you this week?

## 2020-10-13 NOTE — Progress Notes (Signed)
Pt's Hgb was 11.3 today. Pt does not meet parameters to receive retacrit injection. Copy of lab results was given to the pt.

## 2020-10-14 ENCOUNTER — Telehealth: Payer: Self-pay | Admitting: Internal Medicine

## 2020-10-14 NOTE — Telephone Encounter (Signed)
Scheduled appts per 7/6 sch msg. Pt aware.  

## 2020-10-19 ENCOUNTER — Other Ambulatory Visit: Payer: Medicare Other

## 2020-10-19 ENCOUNTER — Ambulatory Visit: Payer: Medicare Other

## 2020-10-20 ENCOUNTER — Inpatient Hospital Stay: Payer: Medicare Other

## 2020-10-20 ENCOUNTER — Inpatient Hospital Stay: Payer: Medicare Other | Admitting: Internal Medicine

## 2020-10-20 ENCOUNTER — Other Ambulatory Visit: Payer: Self-pay

## 2020-10-20 ENCOUNTER — Encounter: Payer: Self-pay | Admitting: Internal Medicine

## 2020-10-20 ENCOUNTER — Telehealth: Payer: Self-pay | Admitting: Internal Medicine

## 2020-10-20 VITALS — BP 148/86 | HR 108 | Temp 98.4°F | Resp 17 | Ht 68.0 in | Wt 163.0 lb

## 2020-10-20 DIAGNOSIS — D473 Essential (hemorrhagic) thrombocythemia: Secondary | ICD-10-CM

## 2020-10-20 DIAGNOSIS — D6481 Anemia due to antineoplastic chemotherapy: Secondary | ICD-10-CM | POA: Diagnosis not present

## 2020-10-20 DIAGNOSIS — D469 Myelodysplastic syndrome, unspecified: Secondary | ICD-10-CM

## 2020-10-20 DIAGNOSIS — T451X5A Adverse effect of antineoplastic and immunosuppressive drugs, initial encounter: Secondary | ICD-10-CM | POA: Diagnosis not present

## 2020-10-20 LAB — CMP (CANCER CENTER ONLY)
ALT: 20 U/L (ref 0–44)
AST: 14 U/L — ABNORMAL LOW (ref 15–41)
Albumin: 4.1 g/dL (ref 3.5–5.0)
Alkaline Phosphatase: 70 U/L (ref 38–126)
Anion gap: 8 (ref 5–15)
BUN: 21 mg/dL (ref 8–23)
CO2: 28 mmol/L (ref 22–32)
Calcium: 9.9 mg/dL (ref 8.9–10.3)
Chloride: 107 mmol/L (ref 98–111)
Creatinine: 1.13 mg/dL (ref 0.61–1.24)
GFR, Estimated: >60 mL/min (ref >60)
Glucose, Bld: 90 mg/dL (ref 70–99)
Potassium: 4.7 mmol/L (ref 3.5–5.1)
Sodium: 143 mmol/L (ref 135–145)
Total Bilirubin: 0.8 mg/dL (ref 0.3–1.2)
Total Protein: 7.3 g/dL (ref 6.5–8.1)

## 2020-10-20 LAB — CBC WITH DIFFERENTIAL (CANCER CENTER ONLY)
Abs Immature Granulocytes: 0.35 10*3/uL — ABNORMAL HIGH (ref 0.00–0.07)
Basophils Absolute: 0.1 10*3/uL (ref 0.0–0.1)
Basophils Relative: 1 %
Eosinophils Absolute: 1.2 10*3/uL — ABNORMAL HIGH (ref 0.0–0.5)
Eosinophils Relative: 7 %
HCT: 33.6 % — ABNORMAL LOW (ref 39.0–52.0)
Hemoglobin: 10.8 g/dL — ABNORMAL LOW (ref 13.0–17.0)
Immature Granulocytes: 2 %
Lymphocytes Relative: 13 %
Lymphs Abs: 2 10*3/uL (ref 0.7–4.0)
MCH: 29 pg (ref 26.0–34.0)
MCHC: 32.1 g/dL (ref 30.0–36.0)
MCV: 90.3 fL (ref 80.0–100.0)
Monocytes Absolute: 0.7 10*3/uL (ref 0.1–1.0)
Monocytes Relative: 4 %
Neutro Abs: 11.3 10*3/uL — ABNORMAL HIGH (ref 1.7–7.7)
Neutrophils Relative %: 73 %
Platelet Count: 315 10*3/uL (ref 150–400)
RBC: 3.72 MIL/uL — ABNORMAL LOW (ref 4.22–5.81)
RDW: 32.5 % — ABNORMAL HIGH (ref 11.5–15.5)
WBC Count: 15.6 10*3/uL — ABNORMAL HIGH (ref 4.0–10.5)
nRBC: 0 % (ref 0.0–0.2)

## 2020-10-20 MED ORDER — EPOETIN ALFA-EPBX 40000 UNIT/ML IJ SOLN
40000.0000 [IU] | Freq: Once | INTRAMUSCULAR | Status: AC
Start: 2020-10-20 — End: 2020-10-20
  Administered 2020-10-20: 40000 [IU] via SUBCUTANEOUS

## 2020-10-20 MED ORDER — ONDANSETRON HCL 8 MG PO TABS: 8.0000 mg | ORAL_TABLET | Freq: Three times a day (TID) | ORAL | 0 refills | Status: DC | PRN

## 2020-10-20 MED ORDER — EPOETIN ALFA-EPBX 40000 UNIT/ML IJ SOLN
INTRAMUSCULAR | Status: AC
  Filled 2020-10-20: qty 1

## 2020-10-20 NOTE — Progress Notes (Signed)
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   PRIOR THERAPY: Previous treatment with combination of Hydrea and anagrelide.  CURRENT THERAPY:  1) Revlimid 5 mg by mouth daily 2 weeks on and 2 weeks off.  Started 03/25/2016.  Managed by Dr. Evelene Croon at [facility]. 2) anagrelide 0.5 mg p.o. twice daily. 3) Aranesp 300 mcg subcutaneously every 3 weeks.  This is switched to Procrit 400 mcg subcutaneously every 2 weeks starting April 13, 2017.  The frequency of the Procrit was changed to weekly schedule when he was in [place].  He is currently on Retacrit weekly as a biosimilar to Procrit.  INTERVAL HISTORY: Roy Johnson 75 y.o. male returns to the clinic today for follow-up visit.  The patient is feeling fine today with no concerning complaints except for occasional nausea and diarrhea.  He is currently on Imodium and probiotic for the diarrhea with some mild improvement.  He continues to complain of fatigue.  He is currently undergoing evaluation by chiropractor for chronic back pain and muscle weakness.  He denied having any current chest pain, shortness of breath, cough or hemoptysis.  He denied having any fever or chills.  He has no nausea, vomiting, diarrhea or constipation.  He has no headache or visual changes.  He is here today for evaluation and repeat blood work.  He had a bone marrow biopsy and aspirate at [facility] few weeks ago and that showed no new findings compared to the previous study.  He was concerned about elevated white blood count but he was on treatment with steroids in the past few months.  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  MEDICATIONS:  Current Outpatient Medications  Medication Sig Dispense Refill   amLODipine (NORVASC) 5 MG tablet Take 5 mg by mouth daily.  2   anagrelide (AGRYLIN) 1 MG capsule TAKE 1 CAPSULE(1 MG) BY MOUTH DAILY 90 capsule 0   cetirizine (ZYRTEC) 10 MG tablet TAKE 1 TABLET(10 MG) BY MOUTH DAILY     epoetin alfa-epbx (RETACRIT) 92957 UNIT/ML injection Inject into the skin.     gabapentin (NEURONTIN) 100 MG capsule TAKE 1 CAPSULE BY MOUTH TWICE DAILY AND 2 AT NIGHT 120 capsule 1   hyoscyamine (LEVSIN SL) 0.125 MG SL tablet Place 1 tablet (0.125 mg total) under the tongue every 4 (four) hours as needed. 90 tablet 3   lenalidomide (REVLIMID) 5 MG capsule Take 5 mg by mouth daily.     Multiple Vitamin (MULTI-VITAMINS) TABS Take 1 tablet by mouth daily.     pantoprazole (PROTONIX) 40 MG tablet Take 40 mg by mouth daily.     vitamin B-12 (CYANOCOBALAMIN) 1000 MCG tablet Take 1  tablet by mouth daily as needed.     No current facility-administered medications for this visit.   Facility-Administered Medications Ordered in Other Visits  Medication Dose Route Frequency Provider Last Rate Last Admin   0.9 %  sodium chloride infusion  250 mL Intravenous Once Curt Bears, MD        SURGICAL HISTORY:  Past Surgical History:  Procedure Laterality Date   COLONOSCOPY     POLYPECTOMY     PROSTATECTOMY     TONSILLECTOMY      REVIEW OF SYSTEMS:  A comprehensive review of systems was negative except for: Constitutional: positive for fatigue Gastrointestinal: positive for diarrhea and nausea Musculoskeletal:  positive for arthralgias   PHYSICAL EXAMINATION: General appearance: alert, cooperative, appears stated age, fatigued, and no distress Head: Normocephalic, without obvious abnormality, atraumatic Neck: no adenopathy, no JVD, supple, symmetrical, trachea midline, and thyroid not enlarged, symmetric, no tenderness/mass/nodules Lymph nodes: Cervical, supraclavicular, and axillary nodes normal. Resp: clear to auscultation bilaterally Back: symmetric, no curvature. ROM normal. No CVA tenderness. Cardio: regular rate and rhythm, S1, S2 normal, no murmur, click, rub or gallop GI: soft, non-tender; bowel sounds normal; no masses,  no organomegaly Extremities: extremities normal, atraumatic, no cyanosis or edema  ECOG PERFORMANCE STATUS: 1 - Symptomatic but completely ambulatory  Blood pressure (!) 148/86, pulse (!) 108, temperature 98.4 F (36.9 C), temperature source Oral, resp. rate 17, height _0  (1.727 m), weight 163 lb (73.9 kg), SpO2 95 %.  LABORATORY DATA:  Lab Results  Component Value Date   WBC 20.7 (H) 10/13/2020   HGB 11.3 (L) 10/13/2020   HCT 35.7 (L) 10/13/2020   MCV 92.0 10/13/2020   PLT 327 10/13/2020      Chemistry      Component Value Date/Time   NA 140 10/13/2020 0946   NA 141 12/08/2016 0928   K 4.6 10/13/2020 0946   K 4.5 12/08/2016 0928   CL 107 10/13/2020 0946   CO2 26 10/13/2020 0946   CO2 27 12/08/2016 0928   BUN 28 (H) 10/13/2020 0946   BUN 14.1 12/08/2016 0928   CREATININE 1.18 10/13/2020 0946   CREATININE 0.9 12/08/2016 0928      Component Value Date/Time   CALCIUM 9.9 10/13/2020 0946   CALCIUM 9.0 12/08/2016 0928   ALKPHOS 77 10/13/2020 0946   ALKPHOS 87 12/08/2016 0928   AST 13 (L) 10/13/2020 0946   AST 25 12/08/2016 0928   ALT 21 10/13/2020 0946   ALT 41 12/08/2016 0928   BILITOT 0.9 10/13/2020 0946   BILITOT 0.84 12/08/2016 0928      RADIOGRAPHIC STUDIES:  ASSESSMENT AND PLAN:  This is a very pleasant 75 years old white male with  essential thrombocythemia with positive JAK-2 mutation as well as myelodysplastic syndrome. He is currently on treatment with Revlimid 5 mg by mouth daily according to the recommendation from [facility].  He is also on treatment with Aranesp every 3 weeks.  Aranesp was a change it to Procrit initially every 2 weeks and currently on weekly basis.  He mentioned that he is feeling much better on Procrit weekly  The patient has been on treatment with Procrit 40,000 unit weekly for the last 2 years.  He continues to tolerate this treatment well. I recommended for the patient to continue his treatment with Procrit as planned. He will also continue on Revlimid 5 mg p.o. daily for 2 weeks every 4 weeks as recommended by Dr. Evelene Croon at [facility] For  the nausea, I will give the patient prescription for Zofran. For the diarrhea he will continue his current treatment with Imodium and probiotic. The patient will come back for follow-up visit in 3 months for evaluation and repeat blood work. He was advised to call immediately if he has any other concerning symptoms in the interval. All questions were answered. The patient knows to call the clinic with any problems, questions or concerns. We can certainly see the patient much sooner if necessary.   Disclaimer: This note was dictated with voice recognition software. Similar sounding words can inadvertently be transcribed and may not be corrected upon review.

## 2020-10-20 NOTE — Telephone Encounter (Signed)
Scheduled per los. Cancelled 7/18 appt due to patient going out of town. Declined printout

## 2020-10-20 NOTE — Patient Instructions (Signed)
Epoetin Alfa injection What is this medication? EPOETIN ALFA (e POE e tin AL fa) helps your body make more red blood cells. This medicine is used to treat anemia caused by chronic kidney disease, cancer chemotherapy, or HIV-therapy. It may also be used before surgery if you have anemia. This medicine may be used for other purposes; ask your health care provider or pharmacist if you have questions. COMMON BRAND NAME(S): Epogen, Procrit, Retacrit What should I tell my care team before I take this medication? They need to know if you have any of these conditions: cancer heart disease high blood pressure history of blood clots history of stroke low levels of folate, iron, or vitamin B12 in the blood seizures an unusual or allergic reaction to erythropoietin, albumin, benzyl alcohol, hamster proteins, other medicines, foods, dyes, or preservatives pregnant or trying to get pregnant breast-feeding How should I use this medication? This medicine is for injection into a vein or under the skin. It is usually given by a health care professional in a hospital or clinic setting. If you get this medicine at home, you will be taught how to prepare and give this medicine. Use exactly as directed. Take your medicine at regular intervals. Do not take your medicine more often than directed. It is important that you put your used needles and syringes in a special sharps container. Do not put them in a trash can. If you do not have a sharps container, call your pharmacist or healthcare provider to get one. A special MedGuide will be given to you by the pharmacist with each prescription and refill. Be sure to read this information carefully each time. Talk to your pediatrician regarding the use of this medicine in children. While this drug may be prescribed for selected conditions, precautions do apply. Overdosage: If you think you have taken too much of this medicine contact a poison control center or emergency  room at once. NOTE: This medicine is only for you. Do not share this medicine with others. What if I miss a dose? If you miss a dose, take it as soon as you can. If it is almost time for your next dose, take only that dose. Do not take double or extra doses. What may interact with this medication? Interactions have not been studied. This list may not describe all possible interactions. Give your health care provider a list of all the medicines, herbs, non-prescription drugs, or dietary supplements you use. Also tell them if you smoke, drink alcohol, or use illegal drugs. Some items may interact with your medicine. What should I watch for while using this medication? Your condition will be monitored carefully while you are receiving this medicine. You may need blood work done while you are taking this medicine. This medicine may cause a decrease in vitamin B6. You should make sure that you get enough vitamin B6 while you are taking this medicine. Discuss the foods you eat and the vitamins you take with your health care professional. What side effects may I notice from receiving this medication? Side effects that you should report to your doctor or health care professional as soon as possible: allergic reactions like skin rash, itching or hives, swelling of the face, lips, or tongue seizures signs and symptoms of a blood clot such as breathing problems; changes in vision; chest pain; severe, sudden headache; pain, swelling, warmth in the leg; trouble speaking; sudden numbness or weakness of the face, arm or leg signs and symptoms of a stroke like   changes in vision; confusion; trouble speaking or understanding; severe headaches; sudden numbness or weakness of the face, arm or leg; trouble walking; dizziness; loss of balance or coordination Side effects that usually do not require medical attention (report to your doctor or health care professional if they continue or are  bothersome): chills cough dizziness fever headaches joint pain muscle cramps muscle pain nausea, vomiting pain, redness, or irritation at site where injected This list may not describe all possible side effects. Call your doctor for medical advice about side effects. You may report side effects to FDA at 1-800-FDA-1088. Where should I keep my medication? Keep out of the reach of children. Store in a refrigerator between 2 and 8 degrees C (36 and 46 degrees F). Do not freeze or shake. Throw away any unused portion if using a single-dose vial. Multi-dose vials can be kept in the refrigerator for up to 21 days after the initial dose. Throw away unused medicine. NOTE: This sheet is a summary. It may not cover all possible information. If you have questions about this medicine, talk to your doctor, pharmacist, or health care provider.  2022 Elsevier/Gold Standard (2016-11-04 08:35:19)  

## 2020-10-26 ENCOUNTER — Other Ambulatory Visit: Payer: Medicare Other

## 2020-10-26 ENCOUNTER — Ambulatory Visit: Payer: Medicare Other

## 2020-11-02 ENCOUNTER — Inpatient Hospital Stay: Payer: Medicare Other

## 2020-11-02 ENCOUNTER — Other Ambulatory Visit: Payer: Self-pay

## 2020-11-02 VITALS — BP 140/79 | HR 73 | Temp 98.2°F | Resp 18

## 2020-11-02 DIAGNOSIS — D469 Myelodysplastic syndrome, unspecified: Secondary | ICD-10-CM

## 2020-11-02 LAB — CMP (CANCER CENTER ONLY)
ALT: 16 U/L (ref 0–44)
AST: 13 U/L — ABNORMAL LOW (ref 15–41)
Albumin: 3.6 g/dL (ref 3.5–5.0)
Alkaline Phosphatase: 56 U/L (ref 38–126)
Anion gap: 5 (ref 5–15)
BUN: 23 mg/dL (ref 8–23)
CO2: 25 mmol/L (ref 22–32)
Calcium: 8.7 mg/dL — ABNORMAL LOW (ref 8.9–10.3)
Chloride: 111 mmol/L (ref 98–111)
Creatinine: 1.02 mg/dL (ref 0.61–1.24)
GFR, Estimated: >60 mL/min (ref >60)
Glucose, Bld: 110 mg/dL — ABNORMAL HIGH (ref 70–99)
Potassium: 4.7 mmol/L (ref 3.5–5.1)
Sodium: 141 mmol/L (ref 135–145)
Total Bilirubin: 0.6 mg/dL (ref 0.3–1.2)
Total Protein: 6.3 g/dL — ABNORMAL LOW (ref 6.5–8.1)

## 2020-11-02 LAB — CBC WITH DIFFERENTIAL (CANCER CENTER ONLY)
Abs Immature Granulocytes: 0.05 10*3/uL (ref 0.00–0.07)
Basophils Absolute: 0.1 10*3/uL (ref 0.0–0.1)
Basophils Relative: 2 %
Eosinophils Absolute: 0.2 10*3/uL (ref 0.0–0.5)
Eosinophils Relative: 3 %
HCT: 26.9 % — ABNORMAL LOW (ref 39.0–52.0)
Hemoglobin: 8.6 g/dL — ABNORMAL LOW (ref 13.0–17.0)
Immature Granulocytes: 1 %
Lymphocytes Relative: 18 %
Lymphs Abs: 1.1 10*3/uL (ref 0.7–4.0)
MCH: 29.3 pg (ref 26.0–34.0)
MCHC: 32 g/dL (ref 30.0–36.0)
MCV: 91.5 fL (ref 80.0–100.0)
Monocytes Absolute: 0.5 10*3/uL (ref 0.1–1.0)
Monocytes Relative: 7 %
Neutro Abs: 4.4 10*3/uL (ref 1.7–7.7)
Neutrophils Relative %: 69 %
Platelet Count: 234 10*3/uL (ref 150–400)
RBC: 2.94 MIL/uL — ABNORMAL LOW (ref 4.22–5.81)
RDW: 31.7 % — ABNORMAL HIGH (ref 11.5–15.5)
WBC Count: 6.3 10*3/uL (ref 4.0–10.5)
nRBC: 0 % (ref 0.0–0.2)

## 2020-11-02 MED ORDER — EPOETIN ALFA-EPBX 40000 UNIT/ML IJ SOLN
40000.0000 [IU] | Freq: Once | INTRAMUSCULAR | Status: AC
Start: 2020-11-02 — End: 2020-11-02
  Administered 2020-11-02: 40000 [IU] via SUBCUTANEOUS

## 2020-11-02 MED ORDER — EPOETIN ALFA-EPBX 40000 UNIT/ML IJ SOLN
INTRAMUSCULAR | Status: AC
  Filled 2020-11-02: qty 1

## 2020-11-02 NOTE — Patient Instructions (Signed)
Epoetin Alfa injection What is this medication? EPOETIN ALFA (e POE e tin AL fa) helps your body make more red blood cells. This medicine is used to treat anemia caused by chronic kidney disease, cancer chemotherapy, or HIV-therapy. It may also be used before surgery if you have anemia. This medicine may be used for other purposes; ask your health care provider or pharmacist if you have questions. COMMON BRAND NAME(S): Epogen, Procrit, Retacrit What should I tell my care team before I take this medication? They need to know if you have any of these conditions: cancer heart disease high blood pressure history of blood clots history of stroke low levels of folate, iron, or vitamin B12 in the blood seizures an unusual or allergic reaction to erythropoietin, albumin, benzyl alcohol, hamster proteins, other medicines, foods, dyes, or preservatives pregnant or trying to get pregnant breast-feeding How should I use this medication? This medicine is for injection into a vein or under the skin. It is usually given by a health care professional in a hospital or clinic setting. If you get this medicine at home, you will be taught how to prepare and give this medicine. Use exactly as directed. Take your medicine at regular intervals. Do not take your medicine more often than directed. It is important that you put your used needles and syringes in a special sharps container. Do not put them in a trash can. If you do not have a sharps container, call your pharmacist or healthcare provider to get one. A special MedGuide will be given to you by the pharmacist with each prescription and refill. Be sure to read this information carefully each time. Talk to your pediatrician regarding the use of this medicine in children. While this drug may be prescribed for selected conditions, precautions do apply. Overdosage: If you think you have taken too much of this medicine contact a poison control center or emergency  room at once. NOTE: This medicine is only for you. Do not share this medicine with others. What if I miss a dose? If you miss a dose, take it as soon as you can. If it is almost time for your next dose, take only that dose. Do not take double or extra doses. What may interact with this medication? Interactions have not been studied. This list may not describe all possible interactions. Give your health care provider a list of all the medicines, herbs, non-prescription drugs, or dietary supplements you use. Also tell them if you smoke, drink alcohol, or use illegal drugs. Some items may interact with your medicine. What should I watch for while using this medication? Your condition will be monitored carefully while you are receiving this medicine. You may need blood work done while you are taking this medicine. This medicine may cause a decrease in vitamin B6. You should make sure that you get enough vitamin B6 while you are taking this medicine. Discuss the foods you eat and the vitamins you take with your health care professional. What side effects may I notice from receiving this medication? Side effects that you should report to your doctor or health care professional as soon as possible: allergic reactions like skin rash, itching or hives, swelling of the face, lips, or tongue seizures signs and symptoms of a blood clot such as breathing problems; changes in vision; chest pain; severe, sudden headache; pain, swelling, warmth in the leg; trouble speaking; sudden numbness or weakness of the face, arm or leg signs and symptoms of a stroke like   changes in vision; confusion; trouble speaking or understanding; severe headaches; sudden numbness or weakness of the face, arm or leg; trouble walking; dizziness; loss of balance or coordination Side effects that usually do not require medical attention (report to your doctor or health care professional if they continue or are  bothersome): chills cough dizziness fever headaches joint pain muscle cramps muscle pain nausea, vomiting pain, redness, or irritation at site where injected This list may not describe all possible side effects. Call your doctor for medical advice about side effects. You may report side effects to FDA at 1-800-FDA-1088. Where should I keep my medication? Keep out of the reach of children. Store in a refrigerator between 2 and 8 degrees C (36 and 46 degrees F). Do not freeze or shake. Throw away any unused portion if using a single-dose vial. Multi-dose vials can be kept in the refrigerator for up to 21 days after the initial dose. Throw away unused medicine. NOTE: This sheet is a summary. It may not cover all possible information. If you have questions about this medicine, talk to your doctor, pharmacist, or health care provider.  2022 Elsevier/Gold Standard (2016-11-04 08:35:19)  

## 2020-11-09 ENCOUNTER — Inpatient Hospital Stay: Payer: Medicare Other | Attending: Physician Assistant

## 2020-11-09 ENCOUNTER — Other Ambulatory Visit: Payer: Self-pay

## 2020-11-09 ENCOUNTER — Inpatient Hospital Stay: Payer: Medicare Other

## 2020-11-09 VITALS — BP 151/69 | HR 57 | Resp 20

## 2020-11-09 DIAGNOSIS — D469 Myelodysplastic syndrome, unspecified: Secondary | ICD-10-CM

## 2020-11-09 LAB — CBC WITH DIFFERENTIAL (CANCER CENTER ONLY)
Abs Immature Granulocytes: 1.18 10*3/uL — ABNORMAL HIGH (ref 0.00–0.07)
Basophils Absolute: 0.3 10*3/uL — ABNORMAL HIGH (ref 0.0–0.1)
Basophils Relative: 2 %
Eosinophils Absolute: 0.2 10*3/uL (ref 0.0–0.5)
Eosinophils Relative: 1 %
HCT: 30 % — ABNORMAL LOW (ref 39.0–52.0)
Hemoglobin: 9.6 g/dL — ABNORMAL LOW (ref 13.0–17.0)
Immature Granulocytes: 9 %
Lymphocytes Relative: 14 %
Lymphs Abs: 2 10*3/uL (ref 0.7–4.0)
MCH: 29.3 pg (ref 26.0–34.0)
MCHC: 32 g/dL (ref 30.0–36.0)
MCV: 91.5 fL (ref 80.0–100.0)
Monocytes Absolute: 0.9 10*3/uL (ref 0.1–1.0)
Monocytes Relative: 6 %
Neutro Abs: 9.1 10*3/uL — ABNORMAL HIGH (ref 1.7–7.7)
Neutrophils Relative %: 68 %
Platelet Count: 459 10*3/uL — ABNORMAL HIGH (ref 150–400)
RBC: 3.28 MIL/uL — ABNORMAL LOW (ref 4.22–5.81)
RDW: 32.6 % — ABNORMAL HIGH (ref 11.5–15.5)
Smear Review: ADEQUATE
WBC Count: 13.6 10*3/uL — ABNORMAL HIGH (ref 4.0–10.5)
nRBC: 0.6 % — ABNORMAL HIGH (ref 0.0–0.2)

## 2020-11-09 MED ORDER — EPOETIN ALFA-EPBX 40000 UNIT/ML IJ SOLN
INTRAMUSCULAR | Status: AC
  Filled 2020-11-09: qty 1

## 2020-11-09 MED ORDER — EPOETIN ALFA-EPBX 40000 UNIT/ML IJ SOLN
40000.0000 [IU] | Freq: Once | INTRAMUSCULAR | Status: AC
  Administered 2020-11-09: 40000 [IU] via SUBCUTANEOUS

## 2020-11-09 NOTE — Patient Instructions (Signed)
Epoetin Alfa injection What is this medication? EPOETIN ALFA (e POE e tin AL fa) helps your body make more red blood cells. This medicine is used to treat anemia caused by chronic kidney disease, cancer chemotherapy, or HIV-therapy. It may also be used before surgery if you have anemia. This medicine may be used for other purposes; ask your health care provider or pharmacist if you have questions. COMMON BRAND NAME(S): Epogen, Procrit, Retacrit What should I tell my care team before I take this medication? They need to know if you have any of these conditions: cancer heart disease high blood pressure history of blood clots history of stroke low levels of folate, iron, or vitamin B12 in the blood seizures an unusual or allergic reaction to erythropoietin, albumin, benzyl alcohol, hamster proteins, other medicines, foods, dyes, or preservatives pregnant or trying to get pregnant breast-feeding How should I use this medication? This medicine is for injection into a vein or under the skin. It is usually given by a health care professional in a hospital or clinic setting. If you get this medicine at home, you will be taught how to prepare and give this medicine. Use exactly as directed. Take your medicine at regular intervals. Do not take your medicine more often than directed. It is important that you put your used needles and syringes in a special sharps container. Do not put them in a trash can. If you do not have a sharps container, call your pharmacist or healthcare provider to get one. A special MedGuide will be given to you by the pharmacist with each prescription and refill. Be sure to read this information carefully each time. Talk to your pediatrician regarding the use of this medicine in children. While this drug may be prescribed for selected conditions, precautions do apply. Overdosage: If you think you have taken too much of this medicine contact a poison control center or emergency  room at once. NOTE: This medicine is only for you. Do not share this medicine with others. What if I miss a dose? If you miss a dose, take it as soon as you can. If it is almost time for your next dose, take only that dose. Do not take double or extra doses. What may interact with this medication? Interactions have not been studied. This list may not describe all possible interactions. Give your health care provider a list of all the medicines, herbs, non-prescription drugs, or dietary supplements you use. Also tell them if you smoke, drink alcohol, or use illegal drugs. Some items may interact with your medicine. What should I watch for while using this medication? Your condition will be monitored carefully while you are receiving this medicine. You may need blood work done while you are taking this medicine. This medicine may cause a decrease in vitamin B6. You should make sure that you get enough vitamin B6 while you are taking this medicine. Discuss the foods you eat and the vitamins you take with your health care professional. What side effects may I notice from receiving this medication? Side effects that you should report to your doctor or health care professional as soon as possible: allergic reactions like skin rash, itching or hives, swelling of the face, lips, or tongue seizures signs and symptoms of a blood clot such as breathing problems; changes in vision; chest pain; severe, sudden headache; pain, swelling, warmth in the leg; trouble speaking; sudden numbness or weakness of the face, arm or leg signs and symptoms of a stroke like   changes in vision; confusion; trouble speaking or understanding; severe headaches; sudden numbness or weakness of the face, arm or leg; trouble walking; dizziness; loss of balance or coordination Side effects that usually do not require medical attention (report to your doctor or health care professional if they continue or are  bothersome): chills cough dizziness fever headaches joint pain muscle cramps muscle pain nausea, vomiting pain, redness, or irritation at site where injected This list may not describe all possible side effects. Call your doctor for medical advice about side effects. You may report side effects to FDA at 1-800-FDA-1088. Where should I keep my medication? Keep out of the reach of children. Store in a refrigerator between 2 and 8 degrees C (36 and 46 degrees F). Do not freeze or shake. Throw away any unused portion if using a single-dose vial. Multi-dose vials can be kept in the refrigerator for up to 21 days after the initial dose. Throw away unused medicine. NOTE: This sheet is a summary. It may not cover all possible information. If you have questions about this medicine, talk to your doctor, pharmacist, or health care provider.  2022 Elsevier/Gold Standard (2016-11-04 08:35:19)  

## 2020-11-16 ENCOUNTER — Inpatient Hospital Stay: Payer: Medicare Other

## 2020-11-16 ENCOUNTER — Other Ambulatory Visit: Payer: Self-pay

## 2020-11-16 ENCOUNTER — Other Ambulatory Visit: Payer: Self-pay | Admitting: Medical Oncology

## 2020-11-16 VITALS — BP 151/77 | HR 80 | Temp 98.6°F | Resp 14

## 2020-11-16 DIAGNOSIS — D469 Myelodysplastic syndrome, unspecified: Secondary | ICD-10-CM

## 2020-11-16 LAB — CBC WITH DIFFERENTIAL (CANCER CENTER ONLY)
Abs Immature Granulocytes: 0.96 10*3/uL — ABNORMAL HIGH (ref 0.00–0.07)
Basophils Absolute: 0.2 10*3/uL — ABNORMAL HIGH (ref 0.0–0.1)
Basophils Relative: 1 %
Eosinophils Absolute: 0.9 10*3/uL — ABNORMAL HIGH (ref 0.0–0.5)
Eosinophils Relative: 6 %
HCT: 33.4 % — ABNORMAL LOW (ref 39.0–52.0)
Hemoglobin: 10.8 g/dL — ABNORMAL LOW (ref 13.0–17.0)
Immature Granulocytes: 6 %
Lymphocytes Relative: 13 %
Lymphs Abs: 2.1 10*3/uL (ref 0.7–4.0)
MCH: 29.4 pg (ref 26.0–34.0)
MCHC: 32.3 g/dL (ref 30.0–36.0)
MCV: 91 fL (ref 80.0–100.0)
Monocytes Absolute: 0.7 10*3/uL (ref 0.1–1.0)
Monocytes Relative: 4 %
Neutro Abs: 11.4 10*3/uL — ABNORMAL HIGH (ref 1.7–7.7)
Neutrophils Relative %: 70 %
Platelet Count: 316 10*3/uL (ref 150–400)
RBC: 3.67 MIL/uL — ABNORMAL LOW (ref 4.22–5.81)
RDW: 31.8 % — ABNORMAL HIGH (ref 11.5–15.5)
WBC Count: 16.3 10*3/uL — ABNORMAL HIGH (ref 4.0–10.5)
nRBC: 0.4 % — ABNORMAL HIGH (ref 0.0–0.2)

## 2020-11-16 MED ORDER — EPOETIN ALFA-EPBX 40000 UNIT/ML IJ SOLN
40000.0000 [IU] | Freq: Once | INTRAMUSCULAR | Status: AC
  Administered 2020-11-16: 40000 [IU] via SUBCUTANEOUS

## 2020-11-16 MED ORDER — EPOETIN ALFA-EPBX 40000 UNIT/ML IJ SOLN
INTRAMUSCULAR | Status: AC
  Filled 2020-11-16: qty 1

## 2020-11-16 NOTE — Progress Notes (Signed)
Patient able to receive Retacrit injection due to having MDS.

## 2020-11-16 NOTE — Progress Notes (Deleted)
Pt's Hg is 10.8 today. Per parameters and per pharmacy, pt does not need Retacrit injection today. Pt informed and given copy of lab work.

## 2020-11-23 ENCOUNTER — Ambulatory Visit: Payer: Medicare Other

## 2020-11-23 ENCOUNTER — Inpatient Hospital Stay: Payer: Medicare Other

## 2020-11-23 ENCOUNTER — Other Ambulatory Visit: Payer: Self-pay

## 2020-11-23 ENCOUNTER — Ambulatory Visit: Payer: Medicare Other | Admitting: Internal Medicine

## 2020-11-23 ENCOUNTER — Other Ambulatory Visit: Payer: Medicare Other

## 2020-11-23 VITALS — BP 160/78 | HR 95 | Temp 98.2°F | Resp 18

## 2020-11-23 DIAGNOSIS — D469 Myelodysplastic syndrome, unspecified: Secondary | ICD-10-CM

## 2020-11-23 LAB — CBC WITH DIFFERENTIAL (CANCER CENTER ONLY)
Abs Immature Granulocytes: 0.18 10*3/uL — ABNORMAL HIGH (ref 0.00–0.07)
Basophils Absolute: 0.2 10*3/uL — ABNORMAL HIGH (ref 0.0–0.1)
Basophils Relative: 1 %
Eosinophils Absolute: 1.7 10*3/uL — ABNORMAL HIGH (ref 0.0–0.5)
Eosinophils Relative: 11 %
HCT: 33.2 % — ABNORMAL LOW (ref 39.0–52.0)
Hemoglobin: 10.4 g/dL — ABNORMAL LOW (ref 13.0–17.0)
Immature Granulocytes: 1 %
Lymphocytes Relative: 15 %
Lymphs Abs: 2.4 10*3/uL (ref 0.7–4.0)
MCH: 29.1 pg (ref 26.0–34.0)
MCHC: 31.3 g/dL (ref 30.0–36.0)
MCV: 93 fL (ref 80.0–100.0)
Monocytes Absolute: 1.3 10*3/uL — ABNORMAL HIGH (ref 0.1–1.0)
Monocytes Relative: 8 %
Neutro Abs: 9.6 10*3/uL — ABNORMAL HIGH (ref 1.7–7.7)
Neutrophils Relative %: 64 %
Platelet Count: 227 10*3/uL (ref 150–400)
RBC: 3.57 MIL/uL — ABNORMAL LOW (ref 4.22–5.81)
RDW: 31 % — ABNORMAL HIGH (ref 11.5–15.5)
WBC Count: 15.4 10*3/uL — ABNORMAL HIGH (ref 4.0–10.5)
nRBC: 0.2 % (ref 0.0–0.2)

## 2020-11-23 MED ORDER — EPOETIN ALFA-EPBX 40000 UNIT/ML IJ SOLN
40000.0000 [IU] | Freq: Once | INTRAMUSCULAR | Status: AC
  Administered 2020-11-23: 40000 [IU] via SUBCUTANEOUS
  Filled 2020-11-23: qty 1

## 2020-11-23 NOTE — Patient Instructions (Signed)
Epoetin Alfa injection What is this medication? EPOETIN ALFA (e POE e tin AL fa) helps your body make more red blood cells. This medicine is used to treat anemia caused by chronic kidney disease, cancer chemotherapy, or HIV-therapy. It may also be used before surgery if you have anemia. This medicine may be used for other purposes; ask your health care provider or pharmacist if you have questions. COMMON BRAND NAME(S): Epogen, Procrit, Retacrit What should I tell my care team before I take this medication? They need to know if you have any of these conditions: cancer heart disease high blood pressure history of blood clots history of stroke low levels of folate, iron, or vitamin B12 in the blood seizures an unusual or allergic reaction to erythropoietin, albumin, benzyl alcohol, hamster proteins, other medicines, foods, dyes, or preservatives pregnant or trying to get pregnant breast-feeding How should I use this medication? This medicine is for injection into a vein or under the skin. It is usually given by a health care professional in a hospital or clinic setting. If you get this medicine at home, you will be taught how to prepare and give this medicine. Use exactly as directed. Take your medicine at regular intervals. Do not take your medicine more often than directed. It is important that you put your used needles and syringes in a special sharps container. Do not put them in a trash can. If you do not have a sharps container, call your pharmacist or healthcare provider to get one. A special MedGuide will be given to you by the pharmacist with each prescription and refill. Be sure to read this information carefully each time. Talk to your pediatrician regarding the use of this medicine in children. While this drug may be prescribed for selected conditions, precautions do apply. Overdosage: If you think you have taken too much of this medicine contact a poison control center or emergency  room at once. NOTE: This medicine is only for you. Do not share this medicine with others. What if I miss a dose? If you miss a dose, take it as soon as you can. If it is almost time for your next dose, take only that dose. Do not take double or extra doses. What may interact with this medication? Interactions have not been studied. This list may not describe all possible interactions. Give your health care provider a list of all the medicines, herbs, non-prescription drugs, or dietary supplements you use. Also tell them if you smoke, drink alcohol, or use illegal drugs. Some items may interact with your medicine. What should I watch for while using this medication? Your condition will be monitored carefully while you are receiving this medicine. You may need blood work done while you are taking this medicine. This medicine may cause a decrease in vitamin B6. You should make sure that you get enough vitamin B6 while you are taking this medicine. Discuss the foods you eat and the vitamins you take with your health care professional. What side effects may I notice from receiving this medication? Side effects that you should report to your doctor or health care professional as soon as possible: allergic reactions like skin rash, itching or hives, swelling of the face, lips, or tongue seizures signs and symptoms of a blood clot such as breathing problems; changes in vision; chest pain; severe, sudden headache; pain, swelling, warmth in the leg; trouble speaking; sudden numbness or weakness of the face, arm or leg signs and symptoms of a stroke like   changes in vision; confusion; trouble speaking or understanding; severe headaches; sudden numbness or weakness of the face, arm or leg; trouble walking; dizziness; loss of balance or coordination Side effects that usually do not require medical attention (report to your doctor or health care professional if they continue or are  bothersome): chills cough dizziness fever headaches joint pain muscle cramps muscle pain nausea, vomiting pain, redness, or irritation at site where injected This list may not describe all possible side effects. Call your doctor for medical advice about side effects. You may report side effects to FDA at 1-800-FDA-1088. Where should I keep my medication? Keep out of the reach of children. Store in a refrigerator between 2 and 8 degrees C (36 and 46 degrees F). Do not freeze or shake. Throw away any unused portion if using a single-dose vial. Multi-dose vials can be kept in the refrigerator for up to 21 days after the initial dose. Throw away unused medicine. NOTE: This sheet is a summary. It may not cover all possible information. If you have questions about this medicine, talk to your doctor, pharmacist, or health care provider.  2022 Elsevier/Gold Standard (2016-11-04 08:35:19)  

## 2020-11-30 ENCOUNTER — Other Ambulatory Visit: Payer: Self-pay | Admitting: Medical Oncology

## 2020-11-30 ENCOUNTER — Other Ambulatory Visit: Payer: Medicare Other

## 2020-11-30 ENCOUNTER — Ambulatory Visit: Payer: Medicare Other

## 2020-12-01 ENCOUNTER — Other Ambulatory Visit: Payer: Self-pay

## 2020-12-01 ENCOUNTER — Inpatient Hospital Stay: Payer: Medicare Other

## 2020-12-01 ENCOUNTER — Other Ambulatory Visit: Payer: Self-pay | Admitting: Medical Oncology

## 2020-12-01 VITALS — BP 153/79 | HR 79 | Temp 98.3°F | Resp 18

## 2020-12-01 DIAGNOSIS — D469 Myelodysplastic syndrome, unspecified: Secondary | ICD-10-CM | POA: Diagnosis not present

## 2020-12-01 MED ORDER — EPOETIN ALFA-EPBX 40000 UNIT/ML IJ SOLN
40000.0000 [IU] | Freq: Once | INTRAMUSCULAR | Status: AC
  Administered 2020-12-01: 40000 [IU] via SUBCUTANEOUS
  Filled 2020-12-01: qty 1

## 2020-12-01 NOTE — Progress Notes (Signed)
Per Abelina Bachelor RN, use labs from 11/30/20 @ [facility] for retacrit injection today.  HGB 10.1

## 2020-12-04 ENCOUNTER — Other Ambulatory Visit: Payer: Self-pay | Admitting: Internal Medicine

## 2020-12-04 DIAGNOSIS — D469 Myelodysplastic syndrome, unspecified: Secondary | ICD-10-CM

## 2020-12-07 ENCOUNTER — Inpatient Hospital Stay: Payer: Medicare Other

## 2020-12-07 ENCOUNTER — Other Ambulatory Visit: Payer: Self-pay

## 2020-12-07 ENCOUNTER — Telehealth: Payer: Self-pay | Admitting: Internal Medicine

## 2020-12-07 VITALS — BP 154/76 | HR 82 | Temp 98.7°F | Resp 16

## 2020-12-07 DIAGNOSIS — D469 Myelodysplastic syndrome, unspecified: Secondary | ICD-10-CM

## 2020-12-07 LAB — CBC WITH DIFFERENTIAL (CANCER CENTER ONLY)
Abs Immature Granulocytes: 0.69 10*3/uL — ABNORMAL HIGH (ref 0.00–0.07)
Basophils Absolute: 0.2 10*3/uL — ABNORMAL HIGH (ref 0.0–0.1)
Basophils Relative: 2 %
Eosinophils Absolute: 0.3 10*3/uL (ref 0.0–0.5)
Eosinophils Relative: 3 %
HCT: 32.9 % — ABNORMAL LOW (ref 39.0–52.0)
Hemoglobin: 10.2 g/dL — ABNORMAL LOW (ref 13.0–17.0)
Immature Granulocytes: 6 %
Lymphocytes Relative: 17 %
Lymphs Abs: 1.9 10*3/uL (ref 0.7–4.0)
MCH: 28.8 pg (ref 26.0–34.0)
MCHC: 31 g/dL (ref 30.0–36.0)
MCV: 92.9 fL (ref 80.0–100.0)
Monocytes Absolute: 0.9 10*3/uL (ref 0.1–1.0)
Monocytes Relative: 8 %
Neutro Abs: 7 10*3/uL (ref 1.7–7.7)
Neutrophils Relative %: 64 %
Platelet Count: 344 10*3/uL (ref 150–400)
RBC: 3.54 MIL/uL — ABNORMAL LOW (ref 4.22–5.81)
RDW: 31.4 % — ABNORMAL HIGH (ref 11.5–15.5)
Smear Review: ADEQUATE
WBC Count: 11 10*3/uL — ABNORMAL HIGH (ref 4.0–10.5)
nRBC: 1.2 % — ABNORMAL HIGH (ref 0.0–0.2)

## 2020-12-07 MED ORDER — EPOETIN ALFA-EPBX 40000 UNIT/ML IJ SOLN
40000.0000 [IU] | Freq: Once | INTRAMUSCULAR | Status: AC
  Administered 2020-12-07: 40000 [IU] via SUBCUTANEOUS
  Filled 2020-12-07: qty 1

## 2020-12-07 NOTE — Telephone Encounter (Signed)
Inbound call from patient requesting med refill for hyoscyamine sent to [place] [facility] [place]. Have appt scheduled 10/6

## 2020-12-08 MED ORDER — HYOSCYAMINE SULFATE 0.125 MG SL SUBL: 0.1250 mg | SUBLINGUAL_TABLET | SUBLINGUAL | 3 refills | Status: DC | PRN

## 2020-12-08 NOTE — Addendum Note (Signed)
Addended by: Audrea Muscat on: 12/08/2020 08:54 AM   Modules accepted: Orders

## 2020-12-08 NOTE — Telephone Encounter (Signed)
Levsin refilled

## 2020-12-15 ENCOUNTER — Other Ambulatory Visit: Payer: Self-pay

## 2020-12-15 ENCOUNTER — Inpatient Hospital Stay: Payer: Medicare Other

## 2020-12-15 ENCOUNTER — Inpatient Hospital Stay: Payer: Medicare Other | Attending: Physician Assistant

## 2020-12-15 DIAGNOSIS — D469 Myelodysplastic syndrome, unspecified: Secondary | ICD-10-CM | POA: Insufficient documentation

## 2020-12-15 LAB — CBC WITH DIFFERENTIAL (CANCER CENTER ONLY)
Abs Immature Granulocytes: 0.69 10*3/uL — ABNORMAL HIGH (ref 0.00–0.07)
Basophils Absolute: 0.2 10*3/uL — ABNORMAL HIGH (ref 0.0–0.1)
Basophils Relative: 1 %
Eosinophils Absolute: 0.4 10*3/uL (ref 0.0–0.5)
Eosinophils Relative: 3 %
HCT: 35.4 % — ABNORMAL LOW (ref 39.0–52.0)
Hemoglobin: 11.3 g/dL — ABNORMAL LOW (ref 13.0–17.0)
Immature Granulocytes: 6 %
Lymphocytes Relative: 13 %
Lymphs Abs: 1.6 10*3/uL (ref 0.7–4.0)
MCH: 29.4 pg (ref 26.0–34.0)
MCHC: 31.9 g/dL (ref 30.0–36.0)
MCV: 91.9 fL (ref 80.0–100.0)
Monocytes Absolute: 0.6 10*3/uL (ref 0.1–1.0)
Monocytes Relative: 5 %
Neutro Abs: 9 10*3/uL — ABNORMAL HIGH (ref 1.7–7.7)
Neutrophils Relative %: 72 %
Platelet Count: 275 10*3/uL (ref 150–400)
RBC: 3.85 MIL/uL — ABNORMAL LOW (ref 4.22–5.81)
RDW: 30.4 % — ABNORMAL HIGH (ref 11.5–15.5)
WBC Count: 12.4 10*3/uL — ABNORMAL HIGH (ref 4.0–10.5)
nRBC: 0.3 % — ABNORMAL HIGH (ref 0.0–0.2)

## 2020-12-15 NOTE — Progress Notes (Signed)
Parameters not met for injection today.  Hgb 11.3. Copy of labs given to pt.

## 2020-12-21 ENCOUNTER — Inpatient Hospital Stay: Payer: Medicare Other

## 2020-12-21 ENCOUNTER — Other Ambulatory Visit: Payer: Self-pay

## 2020-12-21 DIAGNOSIS — D469 Myelodysplastic syndrome, unspecified: Secondary | ICD-10-CM

## 2020-12-21 LAB — CBC WITH DIFFERENTIAL (CANCER CENTER ONLY)
Abs Immature Granulocytes: 0.55 10*3/uL — ABNORMAL HIGH (ref 0.00–0.07)
Basophils Absolute: 0.1 10*3/uL (ref 0.0–0.1)
Basophils Relative: 1 %
Eosinophils Absolute: 1.1 10*3/uL — ABNORMAL HIGH (ref 0.0–0.5)
Eosinophils Relative: 9 %
HCT: 35.2 % — ABNORMAL LOW (ref 39.0–52.0)
Hemoglobin: 11 g/dL — ABNORMAL LOW (ref 13.0–17.0)
Immature Granulocytes: 4 %
Lymphocytes Relative: 13 %
Lymphs Abs: 1.7 10*3/uL (ref 0.7–4.0)
MCH: 28.6 pg (ref 26.0–34.0)
MCHC: 31.3 g/dL (ref 30.0–36.0)
MCV: 91.7 fL (ref 80.0–100.0)
Monocytes Absolute: 0.7 10*3/uL (ref 0.1–1.0)
Monocytes Relative: 5 %
Neutro Abs: 8.5 10*3/uL — ABNORMAL HIGH (ref 1.7–7.7)
Neutrophils Relative %: 68 %
Platelet Count: 273 10*3/uL (ref 150–400)
RBC: 3.84 MIL/uL — ABNORMAL LOW (ref 4.22–5.81)
RDW: 30.5 % — ABNORMAL HIGH (ref 11.5–15.5)
WBC Count: 12.6 10*3/uL — ABNORMAL HIGH (ref 4.0–10.5)
nRBC: 0 % (ref 0.0–0.2)

## 2020-12-21 NOTE — Progress Notes (Signed)
No injection today. Hgb 11.0 Patient received copy of lab work.

## 2020-12-28 ENCOUNTER — Other Ambulatory Visit: Payer: Self-pay

## 2020-12-28 ENCOUNTER — Inpatient Hospital Stay: Payer: Medicare Other

## 2020-12-28 VITALS — BP 146/80 | HR 76 | Temp 97.9°F | Resp 18

## 2020-12-28 DIAGNOSIS — D469 Myelodysplastic syndrome, unspecified: Secondary | ICD-10-CM

## 2020-12-28 LAB — CBC WITH DIFFERENTIAL (CANCER CENTER ONLY)
Abs Immature Granulocytes: 0.1 10*3/uL — ABNORMAL HIGH (ref 0.00–0.07)
Band Neutrophils: 30 %
Basophils Absolute: 0 10*3/uL (ref 0.0–0.1)
Basophils Relative: 0 %
Eosinophils Absolute: 0.6 10*3/uL — ABNORMAL HIGH (ref 0.0–0.5)
Eosinophils Relative: 4 %
HCT: 32.5 % — ABNORMAL LOW (ref 39.0–52.0)
Hemoglobin: 10.1 g/dL — ABNORMAL LOW (ref 13.0–17.0)
Lymphocytes Relative: 21 %
Lymphs Abs: 3 10*3/uL (ref 0.7–4.0)
MCH: 28 pg (ref 26.0–34.0)
MCHC: 31.1 g/dL (ref 30.0–36.0)
MCV: 90 fL (ref 80.0–100.0)
Metamyelocytes Relative: 1 %
Monocytes Absolute: 1.6 10*3/uL — ABNORMAL HIGH (ref 0.1–1.0)
Monocytes Relative: 11 %
Neutro Abs: 8.9 10*3/uL — ABNORMAL HIGH (ref 1.7–7.7)
Neutrophils Relative %: 33 %
Platelet Count: 314 10*3/uL (ref 150–400)
RBC: 3.61 MIL/uL — ABNORMAL LOW (ref 4.22–5.81)
RDW: 30.3 % — ABNORMAL HIGH (ref 11.5–15.5)
WBC Count: 14.2 10*3/uL — ABNORMAL HIGH (ref 4.0–10.5)
nRBC: 0 % (ref 0.0–0.2)

## 2020-12-28 MED ORDER — EPOETIN ALFA-EPBX 40000 UNIT/ML IJ SOLN
40000.0000 [IU] | Freq: Once | INTRAMUSCULAR | Status: AC
  Administered 2020-12-28: 40000 [IU] via SUBCUTANEOUS
  Filled 2020-12-28: qty 1

## 2021-01-04 ENCOUNTER — Other Ambulatory Visit: Payer: Self-pay

## 2021-01-04 ENCOUNTER — Telehealth: Payer: Self-pay | Admitting: Neurology

## 2021-01-04 ENCOUNTER — Inpatient Hospital Stay: Payer: Medicare Other

## 2021-01-04 VITALS — BP 156/82 | HR 83 | Temp 98.0°F | Resp 18

## 2021-01-04 DIAGNOSIS — D469 Myelodysplastic syndrome, unspecified: Secondary | ICD-10-CM

## 2021-01-04 LAB — CBC WITH DIFFERENTIAL (CANCER CENTER ONLY)
Abs Immature Granulocytes: 0.28 10*3/uL — ABNORMAL HIGH (ref 0.00–0.07)
Basophils Absolute: 0.3 10*3/uL — ABNORMAL HIGH (ref 0.0–0.1)
Basophils Relative: 2 %
Eosinophils Absolute: 0.4 10*3/uL (ref 0.0–0.5)
Eosinophils Relative: 3 %
HCT: 30.1 % — ABNORMAL LOW (ref 39.0–52.0)
Hemoglobin: 9.6 g/dL — ABNORMAL LOW (ref 13.0–17.0)
Immature Granulocytes: 2 %
Lymphocytes Relative: 15 %
Lymphs Abs: 1.9 10*3/uL (ref 0.7–4.0)
MCH: 28.6 pg (ref 26.0–34.0)
MCHC: 31.9 g/dL (ref 30.0–36.0)
MCV: 89.6 fL (ref 80.0–100.0)
Monocytes Absolute: 1 10*3/uL (ref 0.1–1.0)
Monocytes Relative: 8 %
Neutro Abs: 8.3 10*3/uL — ABNORMAL HIGH (ref 1.7–7.7)
Neutrophils Relative %: 70 %
Platelet Count: 312 10*3/uL (ref 150–400)
RBC: 3.36 MIL/uL — ABNORMAL LOW (ref 4.22–5.81)
RDW: 30.6 % — ABNORMAL HIGH (ref 11.5–15.5)
Smear Review: ADEQUATE
WBC Count: 12 10*3/uL — ABNORMAL HIGH (ref 4.0–10.5)
nRBC: 0.3 % — ABNORMAL HIGH (ref 0.0–0.2)

## 2021-01-04 MED ORDER — EPOETIN ALFA-EPBX 40000 UNIT/ML IJ SOLN
40000.0000 [IU] | Freq: Once | INTRAMUSCULAR | Status: AC
  Administered 2021-01-04: 40000 [IU] via SUBCUTANEOUS
  Filled 2021-01-04: qty 1

## 2021-01-04 NOTE — Telephone Encounter (Signed)
Yall have any work in to put this pt ?

## 2021-01-04 NOTE — Telephone Encounter (Signed)
[place] to why he missed his June appointment.  Pt would like a call with the name of the provider Dr Jannifer Franklin will refer him to here in [place]

## 2021-01-04 NOTE — Telephone Encounter (Signed)
Which provider would you like this pt to see ?

## 2021-01-05 NOTE — Telephone Encounter (Signed)
I spoke to the patient's wife on DPR. He has been schedule on 02/09/21 at 9:30am. He will check in at 9am.

## 2021-01-07 ENCOUNTER — Encounter: Payer: Self-pay | Admitting: *Deleted

## 2021-01-07 ENCOUNTER — Telehealth: Payer: Self-pay | Admitting: Neurology

## 2021-01-07 MED ORDER — GABAPENTIN 100 MG PO CAPS: ORAL_CAPSULE | ORAL | 1 refills | Status: DC

## 2021-01-07 NOTE — Telephone Encounter (Signed)
Pt is needing a refill request for his gabapentin (NEURONTIN) 100 MG capsule sent to the [facility] on [address] and [address].

## 2021-01-11 ENCOUNTER — Inpatient Hospital Stay: Payer: Medicare Other | Attending: Physician Assistant

## 2021-01-11 ENCOUNTER — Inpatient Hospital Stay: Payer: Medicare Other

## 2021-01-11 ENCOUNTER — Other Ambulatory Visit: Payer: Self-pay

## 2021-01-11 VITALS — BP 153/81 | HR 78 | Resp 18

## 2021-01-11 DIAGNOSIS — D72829 Elevated white blood cell count, unspecified: Secondary | ICD-10-CM | POA: Diagnosis not present

## 2021-01-11 DIAGNOSIS — G629 Polyneuropathy, unspecified: Secondary | ICD-10-CM | POA: Diagnosis not present

## 2021-01-11 DIAGNOSIS — R197 Diarrhea, unspecified: Secondary | ICD-10-CM | POA: Insufficient documentation

## 2021-01-11 DIAGNOSIS — R5383 Other fatigue: Secondary | ICD-10-CM | POA: Diagnosis not present

## 2021-01-11 DIAGNOSIS — D473 Essential (hemorrhagic) thrombocythemia: Secondary | ICD-10-CM | POA: Insufficient documentation

## 2021-01-11 DIAGNOSIS — Z79899 Other long term (current) drug therapy: Secondary | ICD-10-CM | POA: Diagnosis not present

## 2021-01-11 DIAGNOSIS — D461 Refractory anemia with ring sideroblasts: Secondary | ICD-10-CM | POA: Diagnosis not present

## 2021-01-11 DIAGNOSIS — M2550 Pain in unspecified joint: Secondary | ICD-10-CM | POA: Diagnosis not present

## 2021-01-11 DIAGNOSIS — D469 Myelodysplastic syndrome, unspecified: Secondary | ICD-10-CM

## 2021-01-11 LAB — CBC WITH DIFFERENTIAL (CANCER CENTER ONLY)
Abs Immature Granulocytes: 0.8 10*3/uL — ABNORMAL HIGH (ref 0.00–0.07)
Basophils Absolute: 0.3 10*3/uL — ABNORMAL HIGH (ref 0.0–0.1)
Basophils Relative: 2 %
Eosinophils Absolute: 0.3 10*3/uL (ref 0.0–0.5)
Eosinophils Relative: 2 %
HCT: 34.2 % — ABNORMAL LOW (ref 39.0–52.0)
Hemoglobin: 10.6 g/dL — ABNORMAL LOW (ref 13.0–17.0)
Immature Granulocytes: 6 %
Lymphocytes Relative: 14 %
Lymphs Abs: 1.8 10*3/uL (ref 0.7–4.0)
MCH: 28 pg (ref 26.0–34.0)
MCHC: 31 g/dL (ref 30.0–36.0)
MCV: 90.5 fL (ref 80.0–100.0)
Monocytes Absolute: 1 10*3/uL (ref 0.1–1.0)
Monocytes Relative: 8 %
Neutro Abs: 8.8 10*3/uL — ABNORMAL HIGH (ref 1.7–7.7)
Neutrophils Relative %: 68 %
Platelet Count: 307 10*3/uL (ref 150–400)
RBC: 3.78 MIL/uL — ABNORMAL LOW (ref 4.22–5.81)
RDW: 31 % — ABNORMAL HIGH (ref 11.5–15.5)
WBC Count: 13.1 10*3/uL — ABNORMAL HIGH (ref 4.0–10.5)
nRBC: 0.7 % — ABNORMAL HIGH (ref 0.0–0.2)

## 2021-01-11 MED ORDER — EPOETIN ALFA-EPBX 40000 UNIT/ML IJ SOLN
40000.0000 [IU] | Freq: Once | INTRAMUSCULAR | Status: AC
  Administered 2021-01-11: 40000 [IU] via SUBCUTANEOUS
  Filled 2021-01-11: qty 1

## 2021-01-14 ENCOUNTER — Ambulatory Visit: Payer: Medicare Other | Admitting: Internal Medicine

## 2021-01-18 ENCOUNTER — Inpatient Hospital Stay: Payer: Medicare Other

## 2021-01-18 ENCOUNTER — Inpatient Hospital Stay: Payer: Medicare Other | Admitting: Internal Medicine

## 2021-01-18 ENCOUNTER — Other Ambulatory Visit: Payer: Self-pay

## 2021-01-18 VITALS — BP 146/80 | HR 79 | Temp 97.1°F | Resp 18 | Ht 68.0 in | Wt 168.4 lb

## 2021-01-18 DIAGNOSIS — D473 Essential (hemorrhagic) thrombocythemia: Secondary | ICD-10-CM

## 2021-01-18 DIAGNOSIS — T451X5A Adverse effect of antineoplastic and immunosuppressive drugs, initial encounter: Secondary | ICD-10-CM | POA: Diagnosis not present

## 2021-01-18 DIAGNOSIS — D6481 Anemia due to antineoplastic chemotherapy: Secondary | ICD-10-CM | POA: Diagnosis not present

## 2021-01-18 DIAGNOSIS — D469 Myelodysplastic syndrome, unspecified: Secondary | ICD-10-CM

## 2021-01-18 DIAGNOSIS — D461 Refractory anemia with ring sideroblasts: Secondary | ICD-10-CM | POA: Diagnosis not present

## 2021-01-18 LAB — CBC WITH DIFFERENTIAL (CANCER CENTER ONLY)
Abs Immature Granulocytes: 0.7 10*3/uL — ABNORMAL HIGH (ref 0.00–0.07)
Basophils Absolute: 0.2 10*3/uL — ABNORMAL HIGH (ref 0.0–0.1)
Basophils Relative: 1 %
Eosinophils Absolute: 0.8 10*3/uL — ABNORMAL HIGH (ref 0.0–0.5)
Eosinophils Relative: 6 %
HCT: 34.3 % — ABNORMAL LOW (ref 39.0–52.0)
Hemoglobin: 10.5 g/dL — ABNORMAL LOW (ref 13.0–17.0)
Immature Granulocytes: 5 %
Lymphocytes Relative: 12 %
Lymphs Abs: 1.6 10*3/uL (ref 0.7–4.0)
MCH: 27.3 pg (ref 26.0–34.0)
MCHC: 30.6 g/dL (ref 30.0–36.0)
MCV: 89.1 fL (ref 80.0–100.0)
Monocytes Absolute: 0.6 10*3/uL (ref 0.1–1.0)
Monocytes Relative: 5 %
Neutro Abs: 9.6 10*3/uL — ABNORMAL HIGH (ref 1.7–7.7)
Neutrophils Relative %: 71 %
Platelet Count: 263 10*3/uL (ref 150–400)
RBC: 3.85 MIL/uL — ABNORMAL LOW (ref 4.22–5.81)
RDW: 31.5 % — ABNORMAL HIGH (ref 11.5–15.5)
WBC Count: 13.5 10*3/uL — ABNORMAL HIGH (ref 4.0–10.5)
WBC Morphology: INCREASED
nRBC: 0.2 % (ref 0.0–0.2)

## 2021-01-18 MED ORDER — EPOETIN ALFA-EPBX 40000 UNIT/ML IJ SOLN
40000.0000 [IU] | Freq: Once | INTRAMUSCULAR | Status: AC
  Administered 2021-01-18: 40000 [IU] via SUBCUTANEOUS
  Filled 2021-01-18: qty 1

## 2021-01-18 NOTE — Patient Instructions (Signed)
Epoetin Alfa injection What is this medication? EPOETIN ALFA (e POE e tin AL fa) helps your body make more red blood cells. This medicine is used to treat anemia caused by chronic kidney disease, cancer chemotherapy, or HIV-therapy. It may also be used before surgery if you have anemia. This medicine may be used for other purposes; ask your health care provider or pharmacist if you have questions. COMMON BRAND NAME(S): Epogen, Procrit, Retacrit What should I tell my care team before I take this medication? They need to know if you have any of these conditions: cancer heart disease high blood pressure history of blood clots history of stroke low levels of folate, iron, or vitamin B12 in the blood seizures an unusual or allergic reaction to erythropoietin, albumin, benzyl alcohol, hamster proteins, other medicines, foods, dyes, or preservatives pregnant or trying to get pregnant breast-feeding How should I use this medication? This medicine is for injection into a vein or under the skin. It is usually given by a health care professional in a hospital or clinic setting. If you get this medicine at home, you will be taught how to prepare and give this medicine. Use exactly as directed. Take your medicine at regular intervals. Do not take your medicine more often than directed. It is important that you put your used needles and syringes in a special sharps container. Do not put them in a trash can. If you do not have a sharps container, call your pharmacist or healthcare provider to get one. A special MedGuide will be given to you by the pharmacist with each prescription and refill. Be sure to read this information carefully each time. Talk to your pediatrician regarding the use of this medicine in children. While this drug may be prescribed for selected conditions, precautions do apply. Overdosage: If you think you have taken too much of this medicine contact a poison control center or emergency  room at once. NOTE: This medicine is only for you. Do not share this medicine with others. What if I miss a dose? If you miss a dose, take it as soon as you can. If it is almost time for your next dose, take only that dose. Do not take double or extra doses. What may interact with this medication? Interactions have not been studied. This list may not describe all possible interactions. Give your health care provider a list of all the medicines, herbs, non-prescription drugs, or dietary supplements you use. Also tell them if you smoke, drink alcohol, or use illegal drugs. Some items may interact with your medicine. What should I watch for while using this medication? Your condition will be monitored carefully while you are receiving this medicine. You may need blood work done while you are taking this medicine. This medicine may cause a decrease in vitamin B6. You should make sure that you get enough vitamin B6 while you are taking this medicine. Discuss the foods you eat and the vitamins you take with your health care professional. What side effects may I notice from receiving this medication? Side effects that you should report to your doctor or health care professional as soon as possible: allergic reactions like skin rash, itching or hives, swelling of the face, lips, or tongue seizures signs and symptoms of a blood clot such as breathing problems; changes in vision; chest pain; severe, sudden headache; pain, swelling, warmth in the leg; trouble speaking; sudden numbness or weakness of the face, arm or leg signs and symptoms of a stroke like   changes in vision; confusion; trouble speaking or understanding; severe headaches; sudden numbness or weakness of the face, arm or leg; trouble walking; dizziness; loss of balance or coordination Side effects that usually do not require medical attention (report to your doctor or health care professional if they continue or are  bothersome): chills cough dizziness fever headaches joint pain muscle cramps muscle pain nausea, vomiting pain, redness, or irritation at site where injected This list may not describe all possible side effects. Call your doctor for medical advice about side effects. You may report side effects to FDA at 1-800-FDA-1088. Where should I keep my medication? Keep out of the reach of children. Store in a refrigerator between 2 and 8 degrees C (36 and 46 degrees F). Do not freeze or shake. Throw away any unused portion if using a single-dose vial. Multi-dose vials can be kept in the refrigerator for up to 21 days after the initial dose. Throw away unused medicine. NOTE: This sheet is a summary. It may not cover all possible information. If you have questions about this medicine, talk to your doctor, pharmacist, or health care provider.  2022 Elsevier/Gold Standard (2016-11-04 08:35:19)  

## 2021-01-18 NOTE — Progress Notes (Signed)
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   PRIOR THERAPY: Previous treatment with combination of Hydrea and anagrelide.  CURRENT THERAPY:  1) Revlimid 5 mg by mouth daily 2 weeks on and 2 weeks off.  Started 03/25/2016.  Managed by Dr. Evelene Croon at [facility]. 2) anagrelide 0.5 mg p.o. twice daily. 3) Aranesp 300 mcg subcutaneously every 3 weeks.  This is switched to Procrit 400 mcg subcutaneously every 2 weeks starting April 13, 2017.  The frequency of the Procrit was changed to weekly schedule when he was in [place].  He is currently on Retacrit weekly as a biosimilar to Procrit.  INTERVAL HISTORY: Roy GASSETT 75 y.o. male returns to the clinic today for follow-up visit.  The patient is feeling fine today with no concerning complaints except for the persistent mild fatigue and mild peripheral neuropathy.  He had a bone marrow biopsy and aspirate at [facility] that showed stable disease.  He is currently on Revlimid 5 mg p.o. daily for 2 weeks on and 2 weeks off.  He is also on Retacrit 40,000 mcg subcutaneously weekly basis.  The patient is feeling fine today with no concerning complaints except for the fatigue.  He has no chest pain, shortness of breath, cough or hemoptysis.  He denied having any nausea, vomiting but has occasional diarrhea with no constipation.  He has no headache or visual changes.  He is here today for evaluation and repeat blood work.  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  MEDICATIONS:  Current Outpatient Medications  Medication Sig Dispense Refill   amLODipine (NORVASC) 5 MG tablet Take 5 mg by mouth daily.  2   anagrelide (AGRYLIN) 1 MG capsule TAKE 1 CAPSULE(1 MG) BY MOUTH DAILY 90 capsule 0   cetirizine (ZYRTEC) 10 MG tablet TAKE 1 TABLET(10 MG) BY MOUTH DAILY     epoetin alfa-epbx (RETACRIT) 90240 UNIT/ML injection Inject into the skin.     gabapentin (NEURONTIN) 100 MG capsule TAKE 1 CAPSULE BY MOUTH TWICE DAILY AND 2 AT NIGHT 120 capsule 1   hyoscyamine (LEVSIN SL) 0.125 MG SL tablet Place 1 tablet (0.125 mg total) under the tongue every 4 (four) hours as needed. 90 tablet 3   lenalidomide (REVLIMID) 5 MG capsule Take 5 mg by mouth daily.     loperamide (IMODIUM) 2 MG capsule 1 tablet.     Multiple Vitamin (MULTI-VITAMINS) TABS Take 1 tablet by mouth daily.     ondansetron (ZOFRAN) 8 MG tablet Take 1 tablet (8 mg total) by mouth every 8 (eight) hours as needed for nausea or vomiting. 20 tablet 0   pantoprazole (PROTONIX) 40 MG tablet Take 40 mg by  mouth daily.     vitamin B-12 (CYANOCOBALAMIN) 1000 MCG tablet Take 1 tablet by mouth daily as needed.     No current facility-administered medications for this visit.   Facility-Administered Medications Ordered in Other Visits  Medication Dose Route Frequency Provider Last Rate Last Admin   0.9 %  sodium chloride infusion  250 mL Intravenous Once Curt Bears, MD        SURGICAL HISTORY:  Past Surgical History:  Procedure Laterality Date   COLONOSCOPY     POLYPECTOMY     PROSTATECTOMY     TONSILLECTOMY      REVIEW OF SYSTEMS:  A comprehensive review of systems was negative except for: Constitutional: positive for fatigue Gastrointestinal: positive for  diarrhea Musculoskeletal: positive for arthralgias   PHYSICAL EXAMINATION: General appearance: alert, cooperative, appears stated age, fatigued, and no distress Head: Normocephalic, without obvious abnormality, atraumatic Neck: no adenopathy, no JVD, supple, symmetrical, trachea midline, and thyroid not enlarged, symmetric, no tenderness/mass/nodules Lymph nodes: Cervical, supraclavicular, and axillary nodes normal. Resp: clear to auscultation bilaterally Back: symmetric, no curvature. ROM normal. No CVA tenderness. Cardio: regular rate and rhythm, S1, S2 normal, no murmur, click, rub or gallop GI: soft, non-tender; bowel sounds normal; no masses,  no organomegaly Extremities: extremities normal, atraumatic, no cyanosis or edema  ECOG PERFORMANCE STATUS: 1 - Symptomatic but completely ambulatory  Blood pressure (!) 146/80, pulse 79, temperature (!) 97.1 F (36.2 C), temperature source Tympanic, resp. rate 18, height 5' 8" (1.727 m), weight 168 lb 6.4 oz (76.4 kg), SpO2 100 %.  LABORATORY DATA:  Lab Results  Component Value Date   WBC 13.5 (H) 01/18/2021   HGB 10.5 (L) 01/18/2021   HCT 34.3 (L) 01/18/2021   MCV 89.1 01/18/2021   PLT 263 01/18/2021      Chemistry      Component Value Date/Time   NA 141 11/02/2020 0951   NA 141 12/08/2016 0928   K 4.7 11/02/2020 0951   K 4.5 12/08/2016 0928   CL 111 11/02/2020 0951   CO2 25 11/02/2020 0951   CO2 27 12/08/2016 0928   BUN 23 11/02/2020 0951   BUN 14.1 12/08/2016 0928   CREATININE 1.02 11/02/2020 0951   CREATININE 0.9 12/08/2016 0928      Component Value Date/Time   CALCIUM 8.7 (L) 11/02/2020 0951   CALCIUM 9.0 12/08/2016 0928   ALKPHOS 56 11/02/2020 0951   ALKPHOS 87 12/08/2016 0928   AST 13 (L) 11/02/2020 0951   AST 25 12/08/2016 0928   ALT 16 11/02/2020 0951   ALT 41 12/08/2016 0928   BILITOT 0.6 11/02/2020 0951   BILITOT 0.84 12/08/2016 0928      RADIOGRAPHIC STUDIES:  ASSESSMENT AND PLAN:  This is a very  pleasant 75 years old white male with essential thrombocythemia with positive JAK-2 mutation as well as myelodysplastic syndrome. He is currently on treatment with Revlimid 5 mg by mouth daily according to the recommendation from [facility].  He is also on treatment with Aranesp every 3 weeks.  Aranesp was a change it to Procrit initially every 2 weeks and currently on weekly basis.  He mentioned that he is feeling much better on Procrit weekly  The patient has been on treatment with Retacrit 40,000 unit weekly for the last 2 years.  He continues to tolerate this treatment well. I recommended for the patient to continue his current treatment with Retacrit with the same dose and weekly basis. He will also continue his treatment  with Revlimid as recommended by Dr. Evelene Croon. For the diarrhea he is currently on Imodium and probiotic. The patient will come back for follow-up visit in around 3 months before his travel to [place] where he stayed there for 6 months of the year. The patient was advised to call immediately if he has any other concerning symptoms in the interval.  All questions were answered. The patient knows to call the clinic with any problems, questions or concerns. We can certainly see the patient much sooner if necessary.   Disclaimer: This note was dictated with voice recognition software. Similar sounding words can inadvertently be transcribed and may not be corrected upon review.

## 2021-01-25 ENCOUNTER — Inpatient Hospital Stay: Payer: Medicare Other

## 2021-01-25 ENCOUNTER — Other Ambulatory Visit: Payer: Self-pay

## 2021-01-25 VITALS — BP 144/79 | HR 74 | Temp 98.4°F | Resp 18

## 2021-01-25 DIAGNOSIS — D469 Myelodysplastic syndrome, unspecified: Secondary | ICD-10-CM

## 2021-01-25 DIAGNOSIS — D461 Refractory anemia with ring sideroblasts: Secondary | ICD-10-CM | POA: Diagnosis not present

## 2021-01-25 LAB — CBC WITH DIFFERENTIAL (CANCER CENTER ONLY)
Abs Immature Granulocytes: 0.41 10*3/uL — ABNORMAL HIGH (ref 0.00–0.07)
Basophils Absolute: 0.2 10*3/uL — ABNORMAL HIGH (ref 0.0–0.1)
Basophils Relative: 1 %
Eosinophils Absolute: 1.6 10*3/uL — ABNORMAL HIGH (ref 0.0–0.5)
Eosinophils Relative: 11 %
HCT: 33.7 % — ABNORMAL LOW (ref 39.0–52.0)
Hemoglobin: 10.5 g/dL — ABNORMAL LOW (ref 13.0–17.0)
Immature Granulocytes: 3 %
Lymphocytes Relative: 13 %
Lymphs Abs: 2.1 10*3/uL (ref 0.7–4.0)
MCH: 27.6 pg (ref 26.0–34.0)
MCHC: 31.2 g/dL (ref 30.0–36.0)
MCV: 88.5 fL (ref 80.0–100.0)
Monocytes Absolute: 1.2 10*3/uL — ABNORMAL HIGH (ref 0.1–1.0)
Monocytes Relative: 8 %
Neutro Abs: 10.1 10*3/uL — ABNORMAL HIGH (ref 1.7–7.7)
Neutrophils Relative %: 64 %
Platelet Count: 269 10*3/uL (ref 150–400)
RBC: 3.81 MIL/uL — ABNORMAL LOW (ref 4.22–5.81)
RDW: 30.7 % — ABNORMAL HIGH (ref 11.5–15.5)
WBC Count: 15.6 10*3/uL — ABNORMAL HIGH (ref 4.0–10.5)
WBC Morphology: INCREASED
nRBC: 0 % (ref 0.0–0.2)

## 2021-01-25 MED ORDER — EPOETIN ALFA-EPBX 40000 UNIT/ML IJ SOLN
40000.0000 [IU] | Freq: Once | INTRAMUSCULAR | Status: AC
  Administered 2021-01-25: 40000 [IU] via SUBCUTANEOUS
  Filled 2021-01-25: qty 1

## 2021-02-01 ENCOUNTER — Inpatient Hospital Stay: Payer: Medicare Other

## 2021-02-01 ENCOUNTER — Other Ambulatory Visit: Payer: Self-pay

## 2021-02-01 VITALS — BP 147/76 | HR 90 | Temp 98.5°F | Resp 17

## 2021-02-01 DIAGNOSIS — D469 Myelodysplastic syndrome, unspecified: Secondary | ICD-10-CM

## 2021-02-01 DIAGNOSIS — D461 Refractory anemia with ring sideroblasts: Secondary | ICD-10-CM | POA: Diagnosis not present

## 2021-02-01 LAB — CBC WITH DIFFERENTIAL (CANCER CENTER ONLY)
Abs Immature Granulocytes: 0.11 10*3/uL — ABNORMAL HIGH (ref 0.00–0.07)
Basophils Absolute: 0.2 10*3/uL — ABNORMAL HIGH (ref 0.0–0.1)
Basophils Relative: 2 %
Eosinophils Absolute: 0.8 10*3/uL — ABNORMAL HIGH (ref 0.0–0.5)
Eosinophils Relative: 8 %
HCT: 32.9 % — ABNORMAL LOW (ref 39.0–52.0)
Hemoglobin: 10.5 g/dL — ABNORMAL LOW (ref 13.0–17.0)
Immature Granulocytes: 1 %
Lymphocytes Relative: 20 %
Lymphs Abs: 1.8 10*3/uL (ref 0.7–4.0)
MCH: 27.5 pg (ref 26.0–34.0)
MCHC: 31.9 g/dL (ref 30.0–36.0)
MCV: 86.1 fL (ref 80.0–100.0)
Monocytes Absolute: 0.7 10*3/uL (ref 0.1–1.0)
Monocytes Relative: 8 %
Neutro Abs: 5.6 10*3/uL (ref 1.7–7.7)
Neutrophils Relative %: 61 %
Platelet Count: 285 10*3/uL (ref 150–400)
RBC: 3.82 MIL/uL — ABNORMAL LOW (ref 4.22–5.81)
RDW: 32.1 % — ABNORMAL HIGH (ref 11.5–15.5)
WBC Count: 9.1 10*3/uL (ref 4.0–10.5)
nRBC: 0 % (ref 0.0–0.2)

## 2021-02-01 MED ORDER — EPOETIN ALFA-EPBX 40000 UNIT/ML IJ SOLN
40000.0000 [IU] | Freq: Once | INTRAMUSCULAR | Status: AC
  Administered 2021-02-01: 40000 [IU] via SUBCUTANEOUS
  Filled 2021-02-01: qty 1

## 2021-02-08 ENCOUNTER — Inpatient Hospital Stay: Payer: Medicare Other

## 2021-02-08 ENCOUNTER — Other Ambulatory Visit: Payer: Self-pay

## 2021-02-08 VITALS — BP 140/79 | HR 76 | Temp 98.2°F | Resp 18

## 2021-02-08 DIAGNOSIS — D461 Refractory anemia with ring sideroblasts: Secondary | ICD-10-CM | POA: Diagnosis not present

## 2021-02-08 DIAGNOSIS — D469 Myelodysplastic syndrome, unspecified: Secondary | ICD-10-CM

## 2021-02-08 LAB — CBC WITH DIFFERENTIAL (CANCER CENTER ONLY)
Abs Immature Granulocytes: 0.47 10*3/uL — ABNORMAL HIGH (ref 0.00–0.07)
Basophils Absolute: 0.3 10*3/uL — ABNORMAL HIGH (ref 0.0–0.1)
Basophils Relative: 3 %
Eosinophils Absolute: 0.3 10*3/uL (ref 0.0–0.5)
Eosinophils Relative: 3 %
HCT: 32.9 % — ABNORMAL LOW (ref 39.0–52.0)
Hemoglobin: 10.3 g/dL — ABNORMAL LOW (ref 13.0–17.0)
Immature Granulocytes: 4 %
Lymphocytes Relative: 17 %
Lymphs Abs: 1.9 10*3/uL (ref 0.7–4.0)
MCH: 27.3 pg (ref 26.0–34.0)
MCHC: 31.3 g/dL (ref 30.0–36.0)
MCV: 87.3 fL (ref 80.0–100.0)
Monocytes Absolute: 1 10*3/uL (ref 0.1–1.0)
Monocytes Relative: 9 %
Neutro Abs: 7 10*3/uL (ref 1.7–7.7)
Neutrophils Relative %: 64 %
Platelet Count: 287 10*3/uL (ref 150–400)
RBC: 3.77 MIL/uL — ABNORMAL LOW (ref 4.22–5.81)
RDW: 31.7 % — ABNORMAL HIGH (ref 11.5–15.5)
WBC Count: 11 10*3/uL — ABNORMAL HIGH (ref 4.0–10.5)
nRBC: 0.7 % — ABNORMAL HIGH (ref 0.0–0.2)

## 2021-02-08 MED ORDER — EPOETIN ALFA-EPBX 40000 UNIT/ML IJ SOLN
40000.0000 [IU] | Freq: Once | INTRAMUSCULAR | Status: AC
  Administered 2021-02-08: 40000 [IU] via SUBCUTANEOUS
  Filled 2021-02-08: qty 1

## 2021-02-08 NOTE — Patient Instructions (Signed)
Epoetin Alfa injection What is this medication? EPOETIN ALFA (e POE e tin AL fa) helps your body make more red blood cells. This medicine is used to treat anemia caused by chronic kidney disease, cancer chemotherapy, or HIV-therapy. It may also be used before surgery if you have anemia. This medicine may be used for other purposes; ask your health care provider or pharmacist if you have questions. COMMON BRAND NAME(S): Epogen, Procrit, Retacrit What should I tell my care team before I take this medication? They need to know if you have any of these conditions: cancer heart disease high blood pressure history of blood clots history of stroke low levels of folate, iron, or vitamin B12 in the blood seizures an unusual or allergic reaction to erythropoietin, albumin, benzyl alcohol, hamster proteins, other medicines, foods, dyes, or preservatives pregnant or trying to get pregnant breast-feeding How should I use this medication? This medicine is for injection into a vein or under the skin. It is usually given by a health care professional in a hospital or clinic setting. If you get this medicine at home, you will be taught how to prepare and give this medicine. Use exactly as directed. Take your medicine at regular intervals. Do not take your medicine more often than directed. It is important that you put your used needles and syringes in a special sharps container. Do not put them in a trash can. If you do not have a sharps container, call your pharmacist or healthcare provider to get one. A special MedGuide will be given to you by the pharmacist with each prescription and refill. Be sure to read this information carefully each time. Talk to your pediatrician regarding the use of this medicine in children. While this drug may be prescribed for selected conditions, precautions do apply. Overdosage: If you think you have taken too much of this medicine contact a poison control center or emergency  room at once. NOTE: This medicine is only for you. Do not share this medicine with others. What if I miss a dose? If you miss a dose, take it as soon as you can. If it is almost time for your next dose, take only that dose. Do not take double or extra doses. What may interact with this medication? Interactions have not been studied. This list may not describe all possible interactions. Give your health care provider a list of all the medicines, herbs, non-prescription drugs, or dietary supplements you use. Also tell them if you smoke, drink alcohol, or use illegal drugs. Some items may interact with your medicine. What should I watch for while using this medication? Your condition will be monitored carefully while you are receiving this medicine. You may need blood work done while you are taking this medicine. This medicine may cause a decrease in vitamin B6. You should make sure that you get enough vitamin B6 while you are taking this medicine. Discuss the foods you eat and the vitamins you take with your health care professional. What side effects may I notice from receiving this medication? Side effects that you should report to your doctor or health care professional as soon as possible: allergic reactions like skin rash, itching or hives, swelling of the face, lips, or tongue seizures signs and symptoms of a blood clot such as breathing problems; changes in vision; chest pain; severe, sudden headache; pain, swelling, warmth in the leg; trouble speaking; sudden numbness or weakness of the face, arm or leg signs and symptoms of a stroke like   changes in vision; confusion; trouble speaking or understanding; severe headaches; sudden numbness or weakness of the face, arm or leg; trouble walking; dizziness; loss of balance or coordination Side effects that usually do not require medical attention (report to your doctor or health care professional if they continue or are  bothersome): chills cough dizziness fever headaches joint pain muscle cramps muscle pain nausea, vomiting pain, redness, or irritation at site where injected This list may not describe all possible side effects. Call your doctor for medical advice about side effects. You may report side effects to FDA at 1-800-FDA-1088. Where should I keep my medication? Keep out of the reach of children. Store in a refrigerator between 2 and 8 degrees C (36 and 46 degrees F). Do not freeze or shake. Throw away any unused portion if using a single-dose vial. Multi-dose vials can be kept in the refrigerator for up to 21 days after the initial dose. Throw away unused medicine. NOTE: This sheet is a summary. It may not cover all possible information. If you have questions about this medicine, talk to your doctor, pharmacist, or health care provider.  2022 Elsevier/Gold Standard (2016-11-04 08:35:19)  

## 2021-02-09 ENCOUNTER — Telehealth: Payer: Self-pay | Admitting: Neurology

## 2021-02-09 ENCOUNTER — Encounter: Payer: Self-pay | Admitting: Neurology

## 2021-02-09 ENCOUNTER — Ambulatory Visit: Payer: Medicare Other | Admitting: Neurology

## 2021-02-09 VITALS — BP 139/77 | HR 89 | Ht 68.0 in | Wt 164.0 lb

## 2021-02-09 DIAGNOSIS — G4710 Hypersomnia, unspecified: Secondary | ICD-10-CM

## 2021-02-09 DIAGNOSIS — G3184 Mild cognitive impairment, so stated: Secondary | ICD-10-CM | POA: Diagnosis not present

## 2021-02-09 DIAGNOSIS — M5416 Radiculopathy, lumbar region: Secondary | ICD-10-CM | POA: Insufficient documentation

## 2021-02-09 DIAGNOSIS — H539 Unspecified visual disturbance: Secondary | ICD-10-CM

## 2021-02-09 NOTE — Telephone Encounter (Signed)
[employer] medicare order sent to GI. NPR they will reach out to the patient to schedule.  

## 2021-02-09 NOTE — Progress Notes (Signed)
Chief Complaint  Patient presents with   New Patient (Initial Visit)    RM 16, alone. Memory loss and paresthesia. Complaining of short term memory loss.Transfer from Dr. Jannifer Franklin.   PCP: Dr. Philip Aspen   ASSESSMENT AND PLAN  Roy Johnson is a 75 y.o. male   Sudden onset left vision loss in June 2022  Differentiation diagnosis include right occipital versus left retinal artery vascular event,  Proceed with MRI of the brain  MRA of the brain and neck  Mild cognitive impairment  Mini-Mental Status Examination today 29/30  Laboratory evaluation showed normal TSH, B12, CMP,  Continue moderate exercise Right foot paresthesia, right lumbar radiculopathy,  EMG nerve conduction study in January 2022 showed no large fiber peripheral neuropathy,  He is under the care of orthopedics/pain management, had MRI of lumbar spine from outside facility, some improvement with epidural injection  Excessive daytime sleepiness, fatigue,  If he continue complains of excessive daytime sleepiness, will refer him to sleep study  DIAGNOSTIC DATA (LABS, IMAGING, TESTING) - I reviewed patient records, labs, notes, testing and imaging myself where available.   MEDICAL HISTORY:  Roy Johnson is a 75 year old male, his primary care physician is Dr. Philip Aspen, [name], return to follow-up for cognitive impairment, new onset left visual loss in June 2022  I reviewed and summarized the referring note. PMHx. 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 Earlie Server,  He was a patient of Dr. Jannifer Franklin in the past, saw him in December 2021, he retired from Radiation protection practitioner job, reported gradual onset mild memory loss over the past 2 years, word finding difficulties, short-term memory loss, slower reading and understanding complicated information presented.  Still functioning daily activity, driving without difficulty, able  to keep his checkbook imbalance, today's Mini-Mental status 29/30  I personally reviewed MRI of the brain in 2019 which showed no significant abnormalities. Laboratory evaluation showed normal CMP, TSH, B12,  Today his main concern is 1 episode of sudden onset left visual loss in June 2022, lasting for 30 seconds, like a thick mesh covered his left eye, no headache, no lateralized motor or sensory deficit.  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 Felecia Shelling January 2022, no evidence of polyneuropathy, no evidence of active lumbar radiculopathy, mild right carpal tunnel  PHYSICAL EXAM:   Vitals:   02/09/21 0912  BP: 139/77  Pulse: 89  Weight: 164 lb (74.4 kg)  Height: 5\' 8"  (1.727 m)   Not recorded     Body mass index is 24.94 kg/m.  PHYSICAL EXAMNIATION:  Gen: NAD, conversant, well nourised, well groomed                     Cardiovascular: Regular rate rhythm, no peripheral edema, warm, nontender. Eyes: Conjunctivae clear without exudates or hemorrhage Neck: Supple, no carotid bruits. Pulmonary: Clear to auscultation bilaterally   NEUROLOGICAL EXAM:  MENTAL STATUS: Speech:    Speech is normal; fluent and spontaneous with normal  comprehension.  Cognition: MMSE - Mini Mental State Exam 02/09/2021  Orientation to time 5  Orientation to Place 5  Registration 3  Attention/ Calculation 5  Recall 2  Language- name 2 objects 2  Language- repeat 1  Language- follow 3 step command 3  Language- read & follow direction 1   Write a sentence 1  Copy design 1  Total score 29       CRANIAL NERVES: CN II: Visual fields are full to confrontation. Pupils are round equal and briskly reactive to light. CN III, IV, VI: extraocular movement are normal. No ptosis. CN V: Facial sensation is intact to light touch CN VII: Face is symmetric with normal eye closure  CN VIII: Hearing is normal to causal conversation. CN IX, X: Phonation is normal. CN XI: Head turning and shoulder shrug are intact  MOTOR: There is no pronator drift of out-stretched arms. Muscle bulk and tone are normal. Muscle strength is normal.  REFLEXES: Reflexes are 2+ and symmetric at the biceps, triceps, knees, and absent at ankles. Plantar responses are flexor.  SENSORY: Decreased to right toe vibratory sensation light touch,  COORDINATION: There is no trunk or limb dysmetria noted.  GAIT/STANCE: Need push-up to get up from seated position, antalgic, dragging right leg,  REVIEW OF SYSTEMS:  Full 14 system review of systems performed and notable only for as above All other review of systems were negative.   ALLERGIES: No Known Allergies  HOME MEDICATIONS: Current Outpatient Medications  Medication Sig Dispense Refill   amLODipine (NORVASC) 5 MG tablet Take 5 mg by mouth daily.  2   anagrelide (AGRYLIN) 1 MG capsule TAKE 1 CAPSULE(1 MG) BY MOUTH DAILY 90 capsule 0   cetirizine (ZYRTEC) 10 MG tablet TAKE 1 TABLET(10 MG) BY MOUTH DAILY     epoetin alfa-epbx (RETACRIT) 29528 UNIT/ML injection Inject into the skin.     gabapentin (NEURONTIN) 100 MG capsule TAKE 1 CAPSULE BY MOUTH TWICE DAILY AND 2 AT NIGHT 120 capsule 1   hyoscyamine (LEVSIN SL) 0.125 MG SL tablet Place 1 tablet (0.125 mg total) under the tongue every 4 (four) hours as needed. 90 tablet 3   lenalidomide (REVLIMID) 5 MG capsule Take 5 mg by mouth daily.     loperamide (IMODIUM) 2 MG capsule 1 tablet.     Multiple Vitamin (MULTI-VITAMINS) TABS Take 1 tablet by mouth daily.      ondansetron (ZOFRAN) 8 MG tablet Take 1 tablet (8 mg total) by mouth every 8 (eight) hours as needed for nausea or vomiting. 20 tablet 0   vitamin B-12 (CYANOCOBALAMIN) 1000 MCG tablet Take 1 tablet by mouth daily as needed.     No current facility-administered medications for this visit.   Facility-Administered Medications Ordered in Other Visits  Medication Dose Route Frequency Provider Last Rate Last Admin   0.9 %  sodium chloride infusion  250 mL Intravenous Once Curt Bears, MD        PAST MEDICAL HISTORY: Past Medical History:  Diagnosis Date   Anemia    Colon polyp    Diverticulitis    Diverticulosis    GERD (gastroesophageal reflux disease)    Hyperlipidemia    IBS (irritable bowel syndrome)    Inguinal hernia    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   Numbness and tingling in both hands    Prostate cancer [facility])                 Prostate cancer ([place])    Umbilical hernia  Vitamin D deficiency     PAST SURGICAL HISTORY: Past Surgical History:  Procedure Laterality Date   COLONOSCOPY     POLYPECTOMY     PROSTATECTOMY     TONSILLECTOMY      FAMILY HISTORY: Family History  Problem Relation Age of Onset   Heart disease Father    Colon polyps Father    Lung cancer Father        cancerous mole   Prostate cancer Father    Osteoarthritis Mother    Breast cancer Mother    Colon cancer Neg Hx    Esophageal cancer Neg Hx    Rectal cancer Neg Hx    Stomach cancer Neg Hx     SOCIAL HISTORY: Social History   Socioeconomic History   Marital status: Married    Spouse name: Not on file   Number of children: 2   Years of education: college   Highest education level: Not on file  Occupational History   Occupation: retired  Tobacco Use   Smoking status: Former    Types: Cigarettes    Quit date: 09/07/1973    Years since quitting: 47.4   Smokeless tobacco: Never  Vaping Use   Vaping Use: Never used  Substance and Sexual Activity   Alcohol use: Yes     Alcohol/week: 7.0 standard drinks    Types: 7 Standard drinks or equivalent per week   Drug use: No   Sexual activity: Not on file  Other Topics Concern   Not on file  Social History Narrative   Lives with wife.   Right-handed.   1 cup caffeine per day.   Social Determinants of Health   Financial Resource Strain: Not on file  Food Insecurity: Not on file  Transportation Needs: Not on file  Physical Activity: Not on file  Stress: Not on file  Social Connections: Not on file  Intimate Partner Violence: Not on file    Total time spent reviewing the chart, obtaining history, examined patient, ordering tests, documentation, consultations and family, care coordination was 65 minutes     Marcial Pacas, M.D. Ph.D.  [facility] excessive daytime sleepiness [address] [place], [place] [address] Ph: [phone] Fax: [phone]  CC:  [name], MD [facility],  [place] [id number]  [name], MD

## 2021-02-12 ENCOUNTER — Other Ambulatory Visit: Payer: Self-pay | Admitting: Internal Medicine

## 2021-02-15 ENCOUNTER — Inpatient Hospital Stay: Payer: Medicare Other | Attending: Physician Assistant

## 2021-02-15 ENCOUNTER — Other Ambulatory Visit: Payer: Self-pay

## 2021-02-15 ENCOUNTER — Encounter: Payer: Self-pay | Admitting: Internal Medicine

## 2021-02-15 ENCOUNTER — Inpatient Hospital Stay: Payer: Medicare Other

## 2021-02-15 VITALS — BP 137/76 | HR 73 | Temp 98.0°F | Resp 18

## 2021-02-15 DIAGNOSIS — D461 Refractory anemia with ring sideroblasts: Secondary | ICD-10-CM | POA: Insufficient documentation

## 2021-02-15 DIAGNOSIS — D72829 Elevated white blood cell count, unspecified: Secondary | ICD-10-CM | POA: Diagnosis not present

## 2021-02-15 DIAGNOSIS — D473 Essential (hemorrhagic) thrombocythemia: Secondary | ICD-10-CM | POA: Insufficient documentation

## 2021-02-15 DIAGNOSIS — D469 Myelodysplastic syndrome, unspecified: Secondary | ICD-10-CM

## 2021-02-15 LAB — CBC WITH DIFFERENTIAL (CANCER CENTER ONLY)
Abs Immature Granulocytes: 1.13 10*3/uL — ABNORMAL HIGH (ref 0.00–0.07)
Basophils Absolute: 0.2 10*3/uL — ABNORMAL HIGH (ref 0.0–0.1)
Basophils Relative: 1 %
Eosinophils Absolute: 0.8 10*3/uL — ABNORMAL HIGH (ref 0.0–0.5)
Eosinophils Relative: 5 %
HCT: 32.6 % — ABNORMAL LOW (ref 39.0–52.0)
Hemoglobin: 10.1 g/dL — ABNORMAL LOW (ref 13.0–17.0)
Immature Granulocytes: 7 %
Lymphocytes Relative: 11 %
Lymphs Abs: 1.7 10*3/uL (ref 0.7–4.0)
MCH: 27.1 pg (ref 26.0–34.0)
MCHC: 31 g/dL (ref 30.0–36.0)
MCV: 87.4 fL (ref 80.0–100.0)
Monocytes Absolute: 0.7 10*3/uL (ref 0.1–1.0)
Monocytes Relative: 5 %
Neutro Abs: 10.8 10*3/uL — ABNORMAL HIGH (ref 1.7–7.7)
Neutrophils Relative %: 71 %
Platelet Count: 299 10*3/uL (ref 150–400)
RBC: 3.73 MIL/uL — ABNORMAL LOW (ref 4.22–5.81)
RDW: 31.7 % — ABNORMAL HIGH (ref 11.5–15.5)
Smear Review: ADEQUATE
WBC Count: 15.3 10*3/uL — ABNORMAL HIGH (ref 4.0–10.5)
WBC Morphology: INCREASED
nRBC: 0.2 % (ref 0.0–0.2)

## 2021-02-15 MED ORDER — EPOETIN ALFA-EPBX 40000 UNIT/ML IJ SOLN
40000.0000 [IU] | Freq: Once | INTRAMUSCULAR | Status: AC
  Administered 2021-02-15: 40000 [IU] via SUBCUTANEOUS
  Filled 2021-02-15: qty 1

## 2021-02-15 NOTE — Patient Instructions (Signed)
Epoetin Alfa injection °What is this medication? °EPOETIN ALFA (e POE e tin AL fa) helps your body make more red blood cells. This medicine is used to treat anemia caused by chronic kidney disease, cancer chemotherapy, or HIV-therapy. It may also be used before surgery if you have anemia. °This medicine may be used for other purposes; ask your health care provider or pharmacist if you have questions. °COMMON BRAND NAME(S): Epogen, Procrit, Retacrit °What should I tell my care team before I take this medication? °They need to know if you have any of these conditions: °cancer °heart disease °high blood pressure °history of blood clots °history of stroke °low levels of folate, iron, or vitamin B12 in the blood °seizures °an unusual or allergic reaction to erythropoietin, albumin, benzyl alcohol, hamster proteins, other medicines, foods, dyes, or preservatives °pregnant or trying to get pregnant °breast-feeding °How should I use this medication? °This medicine is for injection into a vein or under the skin. It is usually given by a health care professional in a hospital or clinic setting. °If you get this medicine at home, you will be taught how to prepare and give this medicine. Use exactly as directed. Take your medicine at regular intervals. Do not take your medicine more often than directed. °It is important that you put your used needles and syringes in a special sharps container. Do not put them in a trash can. If you do not have a sharps container, call your pharmacist or healthcare provider to get one. °A special MedGuide will be given to you by the pharmacist with each prescription and refill. Be sure to read this information carefully each time. °Talk to your pediatrician regarding the use of this medicine in children. While this drug may be prescribed for selected conditions, precautions do apply. °Overdosage: If you think you have taken too much of this medicine contact a poison control center or emergency  room at once. °NOTE: This medicine is only for you. Do not share this medicine with others. °What if I miss a dose? °If you miss a dose, take it as soon as you can. If it is almost time for your next dose, take only that dose. Do not take double or extra doses. °What may interact with this medication? °Interactions have not been studied. °This list may not describe all possible interactions. Give your health care provider a list of all the medicines, herbs, non-prescription drugs, or dietary supplements you use. Also tell them if you smoke, drink alcohol, or use illegal drugs. Some items may interact with your medicine. °What should I watch for while using this medication? °Your condition will be monitored carefully while you are receiving this medicine. °You may need blood work done while you are taking this medicine. °This medicine may cause a decrease in vitamin B6. You should make sure that you get enough vitamin B6 while you are taking this medicine. Discuss the foods you eat and the vitamins you take with your health care professional. °What side effects may I notice from receiving this medication? °Side effects that you should report to your doctor or health care professional as soon as possible: °allergic reactions like skin rash, itching or hives, swelling of the face, lips, or tongue °seizures °signs and symptoms of a blood clot such as breathing problems; changes in vision; chest pain; severe, sudden headache; pain, swelling, warmth in the leg; trouble speaking; sudden numbness or weakness of the face, arm or leg °signs and symptoms of a stroke like   changes in vision; confusion; trouble speaking or understanding; severe headaches; sudden numbness or weakness of the face, arm or leg; trouble walking; dizziness; loss of balance or coordination °Side effects that usually do not require medical attention (report to your doctor or health care professional if they continue or are  bothersome): °chills °cough °dizziness °fever °headaches °joint pain °muscle cramps °muscle pain °nausea, vomiting °pain, redness, or irritation at site where injected °This list may not describe all possible side effects. Call your doctor for medical advice about side effects. You may report side effects to FDA at 1-800-FDA-1088. °Where should I keep my medication? °Keep out of the reach of children. °Store in a refrigerator between 2 and 8 degrees C (36 and 46 degrees F). Do not freeze or shake. Throw away any unused portion if using a single-dose vial. Multi-dose vials can be kept in the refrigerator for up to 21 days after the initial dose. Throw away unused medicine. °NOTE: This sheet is a summary. It may not cover all possible information. If you have questions about this medicine, talk to your doctor, pharmacist, or health care provider. °© 2022 Elsevier/Gold Standard (2016-11-29 00:00:00) ° °

## 2021-02-16 ENCOUNTER — Ambulatory Visit: Payer: Medicare Other | Admitting: Internal Medicine

## 2021-02-16 ENCOUNTER — Encounter: Payer: Self-pay | Admitting: Internal Medicine

## 2021-02-16 VITALS — BP 128/66 | HR 96 | Ht 66.75 in | Wt 165.5 lb

## 2021-02-16 DIAGNOSIS — K589 Irritable bowel syndrome without diarrhea: Secondary | ICD-10-CM | POA: Diagnosis not present

## 2021-02-16 DIAGNOSIS — K219 Gastro-esophageal reflux disease without esophagitis: Secondary | ICD-10-CM | POA: Diagnosis not present

## 2021-02-16 MED ORDER — PANTOPRAZOLE SODIUM 40 MG PO TBEC: 40.0000 mg | DELAYED_RELEASE_TABLET | Freq: Every day | ORAL | 3 refills | Status: DC

## 2021-02-16 NOTE — Progress Notes (Signed)
HISTORY OF PRESENT ILLNESS:  Roy Johnson is a 75 y.o. male with myelodysplastic syndrome who presents today with a myriad of GI complaints.  He is noted to have chronic alternating bowel habits consistent with irritable bowel syndrome, intermittent abdominal cramping with vagal symptoms responding to antispasmodics, and diverticulosis with a history of diverticulitis.  He was last seen via telehealth medicine July 2020.  Patient tells me that he does have intermittent problems with acid reflux.  His issues can be problematic at night.  He takes Gaviscon.  No dysphagia.  He also describes intermittent problems with nausea.  The smell of food may result in nausea.  No vomiting.  His oncologist prescribe Zofran which helped.  He has requested a refill of that medication.  Next, he continues with diarrhea with occasional constipation.  He uses one half Imodium per day.  He did describe 1 episode of urgency with fecal incontinence.  He also experiences intermittent problems with hemorrhoidal swelling for which he takes Preparation H.  He continues with occasional vagal type symptoms when having abdominal cramps with defecation.  No bleeding.  Review of blood work from February 15, 2021 shows a hemoglobin of 10.1.  For generalized abdominal pain and vomiting he did undergo a CT scan of the abdomen and pelvis with contrast June 2020.  No acute abnormalities.  Incidental cholelithiasis noted.  His last complete colonoscopy was performed April 2023.  Severe diverticulosis but no neoplasia.  Follow-up in 10 years recommended  REVIEW OF SYSTEMS:  All non-GI ROS negative unless otherwise stated in the HPI except for hearing problems, urinary leakage, sleeping problems, fatigue, back pain (sciatica)  Past Medical History:  Diagnosis Date   Anemia    Colon polyp    Diverticulitis    Diverticulosis    GERD (gastroesophageal reflux disease)    Hyperlipidemia    IBS (irritable bowel syndrome)    Inguinal hernia     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   Numbness and tingling in both hands    Prostate cancer ([facility])    RARS (refractory anemia with ringed sideroblasts) ([facility])    Umbilical hernia    Vitamin D deficiency     Past Surgical History:  Procedure Laterality Date   COLONOSCOPY     POLYPECTOMY     PROSTATECTOMY     TONSILLECTOMY      Social History Roy Johnson  reports that he quit smoking about 47 years ago. His smoking use included cigarettes. He has never used smokeless tobacco. He reports current alcohol use of about 7.0 standard drinks per week. He reports that he does not use drugs.  family history includes Breast cancer in his mother; Colon polyps in his father; Heart disease in his father; Lung cancer in his father; Osteoarthritis in his mother; Prostate cancer in his father.  No Known Allergies     PHYSICAL EXAMINATION: Vital signs: BP 128/66 (BP Location: Left Arm, Patient Position: Sitting, Cuff Size: Normal)   Pulse 96   Ht 5' 6.75" (1.695 m) Comment: height measured without shoes  Wt 165 lb 8 oz (75.1 kg)   BMI 26.12 kg/m   Constitutional: generally well-appearing, no acute distress Psychiatric: alert and oriented x3, cooperative Eyes: extraocular movements intact, anicteric, conjunctiva pink Mouth: oral pharynx moist, no lesions Neck: supple no lymphadenopathy Cardiovascular: heart regular rate and rhythm, no murmur Lungs: clear to auscultation bilaterally Abdomen: soft, nontender, nondistended, no obvious ascites, no peritoneal signs, normal bowel sounds, no organomegaly Rectal: Omitted Extremities: no clubbing,  cyanosis, or lower extremity edema bilaterally Skin: no lesions on visible extremities Neuro: No focal deficits.  Cranial nerves intact  ASSESSMENT:  1.  GERD.  Untreated 2.  Nausea.  May be related to GERD 3.  Diarrhea predominant IBS 4.  Hemorrhoidal discomfort 5.  Diverticulosis with a history of diverticulitis 6.  Prior colonoscopy 2013 was negative for  neoplasia   PLAN:  1.  Reflux precautions 2.  Prescribe pantoprazole 40 mg daily.  Medication risks reviewed 3.  Recommend Citrucel 2 tablespoons daily to help with irregular bowel habits.  Also to help with reduce pressure on hemorrhoidal plexus. 4.  Consider colonoscopy next year.  Discussed 5.  Patient will have his Zofran refilled through his oncologist 6.  Continue sublingual Levsin as needed for cramping pain 7.  Ongoing general medical care with PCP and oncologic care with Dr. Earlie Johnson. 8.  Routine GI follow-up 1 year   A total time of 30 minutes was spent preparing to see the patient, obtaining comprehensive history, performing medically appropriate physical examination, counseling the patient regarding the above listed issues, ordering medications, recommending follow-up, and documenting clinical information in the health record

## 2021-02-16 NOTE — Patient Instructions (Signed)
If you are age 75 or older, your body mass index should be between 23-30. Your Body mass index is 26.12 kg/m. If this is out of the aforementioned range listed, please consider follow up with your Primary Care Provider.  If you are age 47 or younger, your body mass index should be between 19-25. Your Body mass index is 26.12 kg/m. If this is out of the aformentioned range listed, please consider follow up with your Primary Care Provider.   ________________________________________________________  The [place] [facility] providers would like to encourage you to use [facility] to communicate with providers for non-urgent requests or questions.  Due to long hold times on the telephone, sending your provider a message by [facility] may be a faster and more efficient way to get a response.  Please allow 48 business hours for a response.  Please remember that this is for non-urgent requests.  _______________________________________________________  We have sent the following medications to your pharmacy for you to pick up at your convenience: Pantoprazole  Take 2 tablespoons of Citrucel in 14 ounces of water or juice

## 2021-02-17 ENCOUNTER — Telehealth: Payer: Self-pay | Admitting: Internal Medicine

## 2021-02-17 NOTE — Telephone Encounter (Signed)
Patient called to follow up on his Medications Pantoprazole and the Citricel because he went to [facility] but they said they do not have anything for him on file. Patient also wants to include that he was going to get a script from his PCP that Dr. Henrene Pastor also was going to give him but he declined now would like to get that the medication from dr. Henrene Pastor because it would be a longer dosage. Requested a call back today.

## 2021-02-19 NOTE — Telephone Encounter (Signed)
Spoke with patient and told him that I had called his pharmacy who told me it was too soon to pick up the Pantoprazole but it would be ready on 11/16.  I also told him Dr. Henrene Pastor agreed to refill his Zofran for one month but wanted him to get it from PCP or oncologist after that.  Patient decided that would just discuss his concern that he didn't have enough Zofran to control his symptoms.  He will pick the Pantoprazole up next week.

## 2021-02-22 ENCOUNTER — Other Ambulatory Visit: Payer: Self-pay

## 2021-02-22 ENCOUNTER — Inpatient Hospital Stay: Payer: Medicare Other

## 2021-02-22 VITALS — BP 136/69 | HR 71 | Temp 98.0°F | Resp 20

## 2021-02-22 DIAGNOSIS — D461 Refractory anemia with ring sideroblasts: Secondary | ICD-10-CM | POA: Diagnosis not present

## 2021-02-22 DIAGNOSIS — D469 Myelodysplastic syndrome, unspecified: Secondary | ICD-10-CM

## 2021-02-22 LAB — CBC WITH DIFFERENTIAL (CANCER CENTER ONLY)
Abs Immature Granulocytes: 0.62 10*3/uL — ABNORMAL HIGH (ref 0.00–0.07)
Basophils Absolute: 0.2 10*3/uL — ABNORMAL HIGH (ref 0.0–0.1)
Basophils Relative: 1 %
Eosinophils Absolute: 1.3 10*3/uL — ABNORMAL HIGH (ref 0.0–0.5)
Eosinophils Relative: 9 %
HCT: 33.5 % — ABNORMAL LOW (ref 39.0–52.0)
Hemoglobin: 10.4 g/dL — ABNORMAL LOW (ref 13.0–17.0)
Immature Granulocytes: 4 %
Lymphocytes Relative: 12 %
Lymphs Abs: 1.7 10*3/uL (ref 0.7–4.0)
MCH: 27.2 pg (ref 26.0–34.0)
MCHC: 31 g/dL (ref 30.0–36.0)
MCV: 87.5 fL (ref 80.0–100.0)
Monocytes Absolute: 1.4 10*3/uL — ABNORMAL HIGH (ref 0.1–1.0)
Monocytes Relative: 9 %
Neutro Abs: 9.9 10*3/uL — ABNORMAL HIGH (ref 1.7–7.7)
Neutrophils Relative %: 65 %
Platelet Count: 290 10*3/uL (ref 150–400)
RBC: 3.83 MIL/uL — ABNORMAL LOW (ref 4.22–5.81)
RDW: 31.4 % — ABNORMAL HIGH (ref 11.5–15.5)
WBC Count: 15 10*3/uL — ABNORMAL HIGH (ref 4.0–10.5)
nRBC: 0 % (ref 0.0–0.2)

## 2021-02-22 MED ORDER — EPOETIN ALFA-EPBX 40000 UNIT/ML IJ SOLN
40000.0000 [IU] | Freq: Once | INTRAMUSCULAR | Status: AC
  Administered 2021-02-22: 40000 [IU] via SUBCUTANEOUS
  Filled 2021-02-22: qty 1

## 2021-02-22 NOTE — Patient Instructions (Signed)
Epoetin Alfa injection °What is this medication? °EPOETIN ALFA (e POE e tin AL fa) helps your body make more red blood cells. This medicine is used to treat anemia caused by chronic kidney disease, cancer chemotherapy, or HIV-therapy. It may also be used before surgery if you have anemia. °This medicine may be used for other purposes; ask your health care provider or pharmacist if you have questions. °COMMON BRAND NAME(S): Epogen, Procrit, Retacrit °What should I tell my care team before I take this medication? °They need to know if you have any of these conditions: °cancer °heart disease °high blood pressure °history of blood clots °history of stroke °low levels of folate, iron, or vitamin B12 in the blood °seizures °an unusual or allergic reaction to erythropoietin, albumin, benzyl alcohol, hamster proteins, other medicines, foods, dyes, or preservatives °pregnant or trying to get pregnant °breast-feeding °How should I use this medication? °This medicine is for injection into a vein or under the skin. It is usually given by a health care professional in a hospital or clinic setting. °If you get this medicine at home, you will be taught how to prepare and give this medicine. Use exactly as directed. Take your medicine at regular intervals. Do not take your medicine more often than directed. °It is important that you put your used needles and syringes in a special sharps container. Do not put them in a trash can. If you do not have a sharps container, call your pharmacist or healthcare provider to get one. °A special MedGuide will be given to you by the pharmacist with each prescription and refill. Be sure to read this information carefully each time. °Talk to your pediatrician regarding the use of this medicine in children. While this drug may be prescribed for selected conditions, precautions do apply. °Overdosage: If you think you have taken too much of this medicine contact a poison control center or emergency  room at once. °NOTE: This medicine is only for you. Do not share this medicine with others. °What if I miss a dose? °If you miss a dose, take it as soon as you can. If it is almost time for your next dose, take only that dose. Do not take double or extra doses. °What may interact with this medication? °Interactions have not been studied. °This list may not describe all possible interactions. Give your health care provider a list of all the medicines, herbs, non-prescription drugs, or dietary supplements you use. Also tell them if you smoke, drink alcohol, or use illegal drugs. Some items may interact with your medicine. °What should I watch for while using this medication? °Your condition will be monitored carefully while you are receiving this medicine. °You may need blood work done while you are taking this medicine. °This medicine may cause a decrease in vitamin B6. You should make sure that you get enough vitamin B6 while you are taking this medicine. Discuss the foods you eat and the vitamins you take with your health care professional. °What side effects may I notice from receiving this medication? °Side effects that you should report to your doctor or health care professional as soon as possible: °allergic reactions like skin rash, itching or hives, swelling of the face, lips, or tongue °seizures °signs and symptoms of a blood clot such as breathing problems; changes in vision; chest pain; severe, sudden headache; pain, swelling, warmth in the leg; trouble speaking; sudden numbness or weakness of the face, arm or leg °signs and symptoms of a stroke like   changes in vision; confusion; trouble speaking or understanding; severe headaches; sudden numbness or weakness of the face, arm or leg; trouble walking; dizziness; loss of balance or coordination °Side effects that usually do not require medical attention (report to your doctor or health care professional if they continue or are  bothersome): °chills °cough °dizziness °fever °headaches °joint pain °muscle cramps °muscle pain °nausea, vomiting °pain, redness, or irritation at site where injected °This list may not describe all possible side effects. Call your doctor for medical advice about side effects. You may report side effects to FDA at 1-800-FDA-1088. °Where should I keep my medication? °Keep out of the reach of children. °Store in a refrigerator between 2 and 8 degrees C (36 and 46 degrees F). Do not freeze or shake. Throw away any unused portion if using a single-dose vial. Multi-dose vials can be kept in the refrigerator for up to 21 days after the initial dose. Throw away unused medicine. °NOTE: This sheet is a summary. It may not cover all possible information. If you have questions about this medicine, talk to your doctor, pharmacist, or health care provider. °© 2022 Elsevier/Gold Standard (2016-11-29 00:00:00) ° °

## 2021-02-28 ENCOUNTER — Ambulatory Visit
Admission: RE | Admit: 2021-02-28 | Discharge: 2021-02-28 | Disposition: A | Discharge status: Home / Self Care | Payer: Medicare Other | Source: Ambulatory Visit | Attending: Neurology | Admitting: Neurology

## 2021-02-28 ENCOUNTER — Other Ambulatory Visit: Payer: Self-pay

## 2021-02-28 DIAGNOSIS — G3184 Mild cognitive impairment, so stated: Secondary | ICD-10-CM

## 2021-02-28 MED ORDER — GADOBENATE DIMEGLUMINE 529 MG/ML IV SOLN
15.0000 mL | Freq: Once | INTRAVENOUS | Status: AC | PRN
  Administered 2021-02-28: 15 mL via INTRAVENOUS

## 2021-03-01 ENCOUNTER — Inpatient Hospital Stay: Payer: Medicare Other

## 2021-03-01 VITALS — BP 139/79 | HR 77 | Temp 98.2°F | Resp 18

## 2021-03-01 DIAGNOSIS — D469 Myelodysplastic syndrome, unspecified: Secondary | ICD-10-CM

## 2021-03-01 DIAGNOSIS — D461 Refractory anemia with ring sideroblasts: Secondary | ICD-10-CM | POA: Diagnosis not present

## 2021-03-01 LAB — CBC WITH DIFFERENTIAL (CANCER CENTER ONLY)
Abs Immature Granulocytes: 0.05 10*3/uL (ref 0.00–0.07)
Basophils Absolute: 0.2 10*3/uL — ABNORMAL HIGH (ref 0.0–0.1)
Basophils Relative: 3 %
Eosinophils Absolute: 0.8 10*3/uL — ABNORMAL HIGH (ref 0.0–0.5)
Eosinophils Relative: 9 %
HCT: 32.3 % — ABNORMAL LOW (ref 39.0–52.0)
Hemoglobin: 10.2 g/dL — ABNORMAL LOW (ref 13.0–17.0)
Immature Granulocytes: 1 %
Lymphocytes Relative: 21 %
Lymphs Abs: 1.9 10*3/uL (ref 0.7–4.0)
MCH: 27.4 pg (ref 26.0–34.0)
MCHC: 31.6 g/dL (ref 30.0–36.0)
MCV: 86.8 fL (ref 80.0–100.0)
Monocytes Absolute: 0.7 10*3/uL (ref 0.1–1.0)
Monocytes Relative: 7 %
Neutro Abs: 5.4 10*3/uL (ref 1.7–7.7)
Neutrophils Relative %: 59 %
Platelet Count: 223 10*3/uL (ref 150–400)
RBC: 3.72 MIL/uL — ABNORMAL LOW (ref 4.22–5.81)
RDW: 31.2 % — ABNORMAL HIGH (ref 11.5–15.5)
WBC Count: 9.1 10*3/uL (ref 4.0–10.5)
WBC Morphology: INCREASED
nRBC: 0 % (ref 0.0–0.2)

## 2021-03-01 MED ORDER — EPOETIN ALFA-EPBX 40000 UNIT/ML IJ SOLN
40000.0000 [IU] | Freq: Once | INTRAMUSCULAR | Status: AC
  Administered 2021-03-01: 40000 [IU] via SUBCUTANEOUS
  Filled 2021-03-01: qty 1

## 2021-03-08 ENCOUNTER — Other Ambulatory Visit: Payer: Self-pay

## 2021-03-08 ENCOUNTER — Inpatient Hospital Stay: Payer: Medicare Other

## 2021-03-08 ENCOUNTER — Other Ambulatory Visit: Payer: Self-pay | Admitting: Medical Oncology

## 2021-03-08 VITALS — BP 140/76 | HR 77 | Temp 98.5°F | Resp 18

## 2021-03-08 DIAGNOSIS — D469 Myelodysplastic syndrome, unspecified: Secondary | ICD-10-CM

## 2021-03-08 DIAGNOSIS — D461 Refractory anemia with ring sideroblasts: Secondary | ICD-10-CM | POA: Diagnosis not present

## 2021-03-08 LAB — CBC WITH DIFFERENTIAL (CANCER CENTER ONLY)
Abs Immature Granulocytes: 0.2 10*3/uL — ABNORMAL HIGH (ref 0.00–0.07)
Band Neutrophils: 7 %
Basophils Absolute: 0.1 10*3/uL (ref 0.0–0.1)
Basophils Relative: 1 %
Blasts: 0 %
Eosinophils Absolute: 0.7 10*3/uL — ABNORMAL HIGH (ref 0.0–0.5)
Eosinophils Relative: 7 %
HCT: 31.7 % — ABNORMAL LOW (ref 39.0–52.0)
Hemoglobin: 9.9 g/dL — ABNORMAL LOW (ref 13.0–17.0)
Lymphocytes Relative: 15 %
Lymphs Abs: 1.5 10*3/uL (ref 0.7–4.0)
MCH: 27.3 pg (ref 26.0–34.0)
MCHC: 31.2 g/dL (ref 30.0–36.0)
MCV: 87.6 fL (ref 80.0–100.0)
Metamyelocytes Relative: 2 %
Monocytes Absolute: 0.4 10*3/uL (ref 0.1–1.0)
Monocytes Relative: 4 %
Myelocytes: 0 %
Neutro Abs: 7.2 10*3/uL (ref 1.7–7.7)
Neutrophils Relative %: 64 %
Other: 0 %
Platelet Count: 265 10*3/uL (ref 150–400)
Promyelocytes Relative: 0 %
RBC: 3.62 MIL/uL — ABNORMAL LOW (ref 4.22–5.81)
RDW: 31.7 % — ABNORMAL HIGH (ref 11.5–15.5)
WBC Count: 10.1 10*3/uL (ref 4.0–10.5)
nRBC: 0 /100 WBC
nRBC: 0.6 % — ABNORMAL HIGH (ref 0.0–0.2)

## 2021-03-08 MED ORDER — EPOETIN ALFA-EPBX 40000 UNIT/ML IJ SOLN
40000.0000 [IU] | Freq: Once | INTRAMUSCULAR | Status: AC
  Administered 2021-03-08: 11:00:00 40000 [IU] via SUBCUTANEOUS
  Filled 2021-03-08: qty 1

## 2021-03-08 NOTE — Patient Instructions (Signed)
Epoetin Alfa injection °What is this medication? °EPOETIN ALFA (e POE e tin AL fa) helps your body make more red blood cells. This medicine is used to treat anemia caused by chronic kidney disease, cancer chemotherapy, or HIV-therapy. It may also be used before surgery if you have anemia. °This medicine may be used for other purposes; ask your health care provider or pharmacist if you have questions. °COMMON BRAND NAME(S): Epogen, Procrit, Retacrit °What should I tell my care team before I take this medication? °They need to know if you have any of these conditions: °cancer °heart disease °high blood pressure °history of blood clots °history of stroke °low levels of folate, iron, or vitamin B12 in the blood °seizures °an unusual or allergic reaction to erythropoietin, albumin, benzyl alcohol, hamster proteins, other medicines, foods, dyes, or preservatives °pregnant or trying to get pregnant °breast-feeding °How should I use this medication? °This medicine is for injection into a vein or under the skin. It is usually given by a health care professional in a hospital or clinic setting. °If you get this medicine at home, you will be taught how to prepare and give this medicine. Use exactly as directed. Take your medicine at regular intervals. Do not take your medicine more often than directed. °It is important that you put your used needles and syringes in a special sharps container. Do not put them in a trash can. If you do not have a sharps container, call your pharmacist or healthcare provider to get one. °A special MedGuide will be given to you by the pharmacist with each prescription and refill. Be sure to read this information carefully each time. °Talk to your pediatrician regarding the use of this medicine in children. While this drug may be prescribed for selected conditions, precautions do apply. °Overdosage: If you think you have taken too much of this medicine contact a poison control center or emergency  room at once. °NOTE: This medicine is only for you. Do not share this medicine with others. °What if I miss a dose? °If you miss a dose, take it as soon as you can. If it is almost time for your next dose, take only that dose. Do not take double or extra doses. °What may interact with this medication? °Interactions have not been studied. °This list may not describe all possible interactions. Give your health care provider a list of all the medicines, herbs, non-prescription drugs, or dietary supplements you use. Also tell them if you smoke, drink alcohol, or use illegal drugs. Some items may interact with your medicine. °What should I watch for while using this medication? °Your condition will be monitored carefully while you are receiving this medicine. °You may need blood work done while you are taking this medicine. °This medicine may cause a decrease in vitamin B6. You should make sure that you get enough vitamin B6 while you are taking this medicine. Discuss the foods you eat and the vitamins you take with your health care professional. °What side effects may I notice from receiving this medication? °Side effects that you should report to your doctor or health care professional as soon as possible: °allergic reactions like skin rash, itching or hives, swelling of the face, lips, or tongue °seizures °signs and symptoms of a blood clot such as breathing problems; changes in vision; chest pain; severe, sudden headache; pain, swelling, warmth in the leg; trouble speaking; sudden numbness or weakness of the face, arm or leg °signs and symptoms of a stroke like   changes in vision; confusion; trouble speaking or understanding; severe headaches; sudden numbness or weakness of the face, arm or leg; trouble walking; dizziness; loss of balance or coordination °Side effects that usually do not require medical attention (report to your doctor or health care professional if they continue or are  bothersome): °chills °cough °dizziness °fever °headaches °joint pain °muscle cramps °muscle pain °nausea, vomiting °pain, redness, or irritation at site where injected °This list may not describe all possible side effects. Call your doctor for medical advice about side effects. You may report side effects to FDA at 1-800-FDA-1088. °Where should I keep my medication? °Keep out of the reach of children. °Store in a refrigerator between 2 and 8 degrees C (36 and 46 degrees F). Do not freeze or shake. Throw away any unused portion if using a single-dose vial. Multi-dose vials can be kept in the refrigerator for up to 21 days after the initial dose. Throw away unused medicine. °NOTE: This sheet is a summary. It may not cover all possible information. If you have questions about this medicine, talk to your doctor, pharmacist, or health care provider. °© 2022 Elsevier/Gold Standard (2016-11-29 00:00:00) ° °

## 2021-03-09 ENCOUNTER — Other Ambulatory Visit: Payer: Self-pay | Admitting: Internal Medicine

## 2021-03-09 DIAGNOSIS — R1013 Epigastric pain: Secondary | ICD-10-CM

## 2021-03-15 ENCOUNTER — Inpatient Hospital Stay: Payer: Medicare Other | Attending: Physician Assistant

## 2021-03-15 ENCOUNTER — Other Ambulatory Visit: Payer: Self-pay

## 2021-03-15 ENCOUNTER — Inpatient Hospital Stay: Payer: Medicare Other

## 2021-03-15 VITALS — BP 139/75 | HR 77 | Temp 97.8°F | Resp 18

## 2021-03-15 DIAGNOSIS — D461 Refractory anemia with ring sideroblasts: Secondary | ICD-10-CM | POA: Diagnosis not present

## 2021-03-15 DIAGNOSIS — D473 Essential (hemorrhagic) thrombocythemia: Secondary | ICD-10-CM | POA: Diagnosis not present

## 2021-03-15 DIAGNOSIS — Z79899 Other long term (current) drug therapy: Secondary | ICD-10-CM | POA: Diagnosis not present

## 2021-03-15 DIAGNOSIS — D469 Myelodysplastic syndrome, unspecified: Secondary | ICD-10-CM

## 2021-03-15 LAB — CBC WITH DIFFERENTIAL (CANCER CENTER ONLY)
Abs Immature Granulocytes: 0.88 10*3/uL — ABNORMAL HIGH (ref 0.00–0.07)
Basophils Absolute: 0.2 10*3/uL — ABNORMAL HIGH (ref 0.0–0.1)
Basophils Relative: 1 %
Eosinophils Absolute: 0.9 10*3/uL — ABNORMAL HIGH (ref 0.0–0.5)
Eosinophils Relative: 6 %
HCT: 32.8 % — ABNORMAL LOW (ref 39.0–52.0)
Hemoglobin: 10.1 g/dL — ABNORMAL LOW (ref 13.0–17.0)
Immature Granulocytes: 6 %
Lymphocytes Relative: 12 %
Lymphs Abs: 1.6 10*3/uL (ref 0.7–4.0)
MCH: 27.1 pg (ref 26.0–34.0)
MCHC: 30.8 g/dL (ref 30.0–36.0)
MCV: 87.9 fL (ref 80.0–100.0)
Monocytes Absolute: 0.6 10*3/uL (ref 0.1–1.0)
Monocytes Relative: 4 %
Neutro Abs: 9.8 10*3/uL — ABNORMAL HIGH (ref 1.7–7.7)
Neutrophils Relative %: 71 %
Platelet Count: 301 10*3/uL (ref 150–400)
RBC: 3.73 MIL/uL — ABNORMAL LOW (ref 4.22–5.81)
RDW: 31.9 % — ABNORMAL HIGH (ref 11.5–15.5)
WBC Count: 13.9 10*3/uL — ABNORMAL HIGH (ref 4.0–10.5)
WBC Morphology: INCREASED
nRBC: 0.2 % (ref 0.0–0.2)

## 2021-03-15 MED ORDER — EPOETIN ALFA-EPBX 40000 UNIT/ML IJ SOLN
40000.0000 [IU] | Freq: Once | INTRAMUSCULAR | Status: AC
  Administered 2021-03-15: 40000 [IU] via SUBCUTANEOUS
  Filled 2021-03-15: qty 1

## 2021-03-16 ENCOUNTER — Telehealth: Payer: Self-pay | Admitting: Neurology

## 2021-03-16 NOTE — Telephone Encounter (Signed)
Pt is on the schedule with Dr. Krista Blue on 12/15 and needs to be r/s due to the office closing early that day. He was supposed to be seen 6 weeks from 11/01. Can he be worked in somewhere? Next available appt with Dr. Krista Blue is in mid-January.

## 2021-03-17 ENCOUNTER — Other Ambulatory Visit: Payer: Self-pay

## 2021-03-17 ENCOUNTER — Telehealth: Payer: Self-pay | Admitting: Neurology

## 2021-03-17 ENCOUNTER — Other Ambulatory Visit (HOSPITAL_COMMUNITY): Payer: Self-pay | Admitting: Internal Medicine

## 2021-03-17 ENCOUNTER — Ambulatory Visit (HOSPITAL_COMMUNITY)
Admission: RE | Admit: 2021-03-17 | Discharge: 2021-03-17 | Disposition: A | Discharge status: Home / Self Care | Payer: Medicare Other | Source: Ambulatory Visit | Attending: Internal Medicine | Admitting: Internal Medicine

## 2021-03-17 DIAGNOSIS — H53129 Transient visual loss, unspecified eye: Secondary | ICD-10-CM | POA: Diagnosis present

## 2021-03-17 NOTE — Telephone Encounter (Signed)
Per vo by Dr. Krista Blue, okay for patient to come in the week after Christmas.

## 2021-03-17 NOTE — Telephone Encounter (Signed)
LVM and sent mychart msg informing pt of new appt, 12/15 cancelled due to office closing early that day.

## 2021-03-22 ENCOUNTER — Inpatient Hospital Stay: Payer: Medicare Other

## 2021-03-22 ENCOUNTER — Other Ambulatory Visit: Payer: Self-pay

## 2021-03-22 VITALS — BP 128/73 | HR 75 | Temp 98.2°F | Resp 18

## 2021-03-22 DIAGNOSIS — D469 Myelodysplastic syndrome, unspecified: Secondary | ICD-10-CM

## 2021-03-22 DIAGNOSIS — D461 Refractory anemia with ring sideroblasts: Secondary | ICD-10-CM | POA: Diagnosis not present

## 2021-03-22 LAB — CBC WITH DIFFERENTIAL (CANCER CENTER ONLY)
Abs Immature Granulocytes: 0.33 10*3/uL — ABNORMAL HIGH (ref 0.00–0.07)
Basophils Absolute: 0.2 10*3/uL — ABNORMAL HIGH (ref 0.0–0.1)
Basophils Relative: 1 %
Eosinophils Absolute: 1.1 10*3/uL — ABNORMAL HIGH (ref 0.0–0.5)
Eosinophils Relative: 8 %
HCT: 32.1 % — ABNORMAL LOW (ref 39.0–52.0)
Hemoglobin: 9.9 g/dL — ABNORMAL LOW (ref 13.0–17.0)
Immature Granulocytes: 2 %
Lymphocytes Relative: 13 %
Lymphs Abs: 1.8 10*3/uL (ref 0.7–4.0)
MCH: 26.5 pg (ref 26.0–34.0)
MCHC: 30.8 g/dL (ref 30.0–36.0)
MCV: 86.1 fL (ref 80.0–100.0)
Monocytes Absolute: 1.3 10*3/uL — ABNORMAL HIGH (ref 0.1–1.0)
Monocytes Relative: 9 %
Neutro Abs: 9.2 10*3/uL — ABNORMAL HIGH (ref 1.7–7.7)
Neutrophils Relative %: 67 %
Platelet Count: 258 10*3/uL (ref 150–400)
RBC: 3.73 MIL/uL — ABNORMAL LOW (ref 4.22–5.81)
RDW: 33.1 % — ABNORMAL HIGH (ref 11.5–15.5)
WBC Count: 13.9 10*3/uL — ABNORMAL HIGH (ref 4.0–10.5)
WBC Morphology: INCREASED
nRBC: 0 % (ref 0.0–0.2)

## 2021-03-22 MED ORDER — EPOETIN ALFA-EPBX 40000 UNIT/ML IJ SOLN
40000.0000 [IU] | Freq: Once | INTRAMUSCULAR | Status: AC
  Administered 2021-03-22: 40000 [IU] via SUBCUTANEOUS
  Filled 2021-03-22: qty 1

## 2021-03-23 ENCOUNTER — Ambulatory Visit
Admission: RE | Admit: 2021-03-23 | Discharge: 2021-03-23 | Disposition: A | Discharge status: Home / Self Care | Payer: Medicare Other | Source: Ambulatory Visit | Attending: Internal Medicine | Admitting: Internal Medicine

## 2021-03-23 DIAGNOSIS — R1013 Epigastric pain: Secondary | ICD-10-CM

## 2021-03-25 ENCOUNTER — Ambulatory Visit: Payer: Medicare Other | Admitting: Neurology

## 2021-03-29 ENCOUNTER — Ambulatory Visit: Payer: Medicare Other

## 2021-03-29 ENCOUNTER — Other Ambulatory Visit: Payer: Medicare Other

## 2021-03-30 ENCOUNTER — Other Ambulatory Visit: Payer: Self-pay

## 2021-03-30 ENCOUNTER — Inpatient Hospital Stay: Payer: Medicare Other

## 2021-03-30 VITALS — BP 144/73 | HR 74 | Temp 98.2°F | Resp 18

## 2021-03-30 DIAGNOSIS — D469 Myelodysplastic syndrome, unspecified: Secondary | ICD-10-CM

## 2021-03-30 DIAGNOSIS — D461 Refractory anemia with ring sideroblasts: Secondary | ICD-10-CM | POA: Diagnosis not present

## 2021-03-30 LAB — CBC WITH DIFFERENTIAL (CANCER CENTER ONLY)
Abs Immature Granulocytes: 0.07 10*3/uL (ref 0.00–0.07)
Basophils Absolute: 0.2 10*3/uL — ABNORMAL HIGH (ref 0.0–0.1)
Basophils Relative: 2 %
Eosinophils Absolute: 0.3 10*3/uL (ref 0.0–0.5)
Eosinophils Relative: 4 %
HCT: 28.9 % — ABNORMAL LOW (ref 39.0–52.0)
Hemoglobin: 9.2 g/dL — ABNORMAL LOW (ref 13.0–17.0)
Immature Granulocytes: 1 %
Lymphocytes Relative: 19 %
Lymphs Abs: 1.5 10*3/uL (ref 0.7–4.0)
MCH: 27.1 pg (ref 26.0–34.0)
MCHC: 31.8 g/dL (ref 30.0–36.0)
MCV: 85 fL (ref 80.0–100.0)
Monocytes Absolute: 0.8 10*3/uL (ref 0.1–1.0)
Monocytes Relative: 10 %
Neutro Abs: 5.1 10*3/uL (ref 1.7–7.7)
Neutrophils Relative %: 64 %
Platelet Count: 211 10*3/uL (ref 150–400)
RBC: 3.4 MIL/uL — ABNORMAL LOW (ref 4.22–5.81)
RDW: 33.2 % — ABNORMAL HIGH (ref 11.5–15.5)
WBC Count: 7.9 10*3/uL (ref 4.0–10.5)
nRBC: 0.3 % — ABNORMAL HIGH (ref 0.0–0.2)

## 2021-03-30 MED ORDER — EPOETIN ALFA-EPBX 40000 UNIT/ML IJ SOLN
40000.0000 [IU] | Freq: Once | INTRAMUSCULAR | Status: AC
  Administered 2021-03-30: 11:00:00 40000 [IU] via SUBCUTANEOUS
  Filled 2021-03-30: qty 1

## 2021-03-30 NOTE — Patient Instructions (Signed)
Epoetin Alfa injection °What is this medication? °EPOETIN ALFA (e POE e tin AL fa) helps your body make more red blood cells. This medicine is used to treat anemia caused by chronic kidney disease, cancer chemotherapy, or HIV-therapy. It may also be used before surgery if you have anemia. °This medicine may be used for other purposes; ask your health care provider or pharmacist if you have questions. °COMMON BRAND NAME(S): Epogen, Procrit, Retacrit °What should I tell my care team before I take this medication? °They need to know if you have any of these conditions: °cancer °heart disease °high blood pressure °history of blood clots °history of stroke °low levels of folate, iron, or vitamin B12 in the blood °seizures °an unusual or allergic reaction to erythropoietin, albumin, benzyl alcohol, hamster proteins, other medicines, foods, dyes, or preservatives °pregnant or trying to get pregnant °breast-feeding °How should I use this medication? °This medicine is for injection into a vein or under the skin. It is usually given by a health care professional in a hospital or clinic setting. °If you get this medicine at home, you will be taught how to prepare and give this medicine. Use exactly as directed. Take your medicine at regular intervals. Do not take your medicine more often than directed. °It is important that you put your used needles and syringes in a special sharps container. Do not put them in a trash can. If you do not have a sharps container, call your pharmacist or healthcare provider to get one. °A special MedGuide will be given to you by the pharmacist with each prescription and refill. Be sure to read this information carefully each time. °Talk to your pediatrician regarding the use of this medicine in children. While this drug may be prescribed for selected conditions, precautions do apply. °Overdosage: If you think you have taken too much of this medicine contact a poison control center or emergency  room at once. °NOTE: This medicine is only for you. Do not share this medicine with others. °What if I miss a dose? °If you miss a dose, take it as soon as you can. If it is almost time for your next dose, take only that dose. Do not take double or extra doses. °What may interact with this medication? °Interactions have not been studied. °This list may not describe all possible interactions. Give your health care provider a list of all the medicines, herbs, non-prescription drugs, or dietary supplements you use. Also tell them if you smoke, drink alcohol, or use illegal drugs. Some items may interact with your medicine. °What should I watch for while using this medication? °Your condition will be monitored carefully while you are receiving this medicine. °You may need blood work done while you are taking this medicine. °This medicine may cause a decrease in vitamin B6. You should make sure that you get enough vitamin B6 while you are taking this medicine. Discuss the foods you eat and the vitamins you take with your health care professional. °What side effects may I notice from receiving this medication? °Side effects that you should report to your doctor or health care professional as soon as possible: °allergic reactions like skin rash, itching or hives, swelling of the face, lips, or tongue °seizures °signs and symptoms of a blood clot such as breathing problems; changes in vision; chest pain; severe, sudden headache; pain, swelling, warmth in the leg; trouble speaking; sudden numbness or weakness of the face, arm or leg °signs and symptoms of a stroke like   changes in vision; confusion; trouble speaking or understanding; severe headaches; sudden numbness or weakness of the face, arm or leg; trouble walking; dizziness; loss of balance or coordination °Side effects that usually do not require medical attention (report to your doctor or health care professional if they continue or are  bothersome): °chills °cough °dizziness °fever °headaches °joint pain °muscle cramps °muscle pain °nausea, vomiting °pain, redness, or irritation at site where injected °This list may not describe all possible side effects. Call your doctor for medical advice about side effects. You may report side effects to FDA at 1-800-FDA-1088. °Where should I keep my medication? °Keep out of the reach of children. °Store in a refrigerator between 2 and 8 degrees C (36 and 46 degrees F). Do not freeze or shake. Throw away any unused portion if using a single-dose vial. Multi-dose vials can be kept in the refrigerator for up to 21 days after the initial dose. Throw away unused medicine. °NOTE: This sheet is a summary. It may not cover all possible information. If you have questions about this medicine, talk to your doctor, pharmacist, or health care provider. °© 2022 Elsevier/Gold Standard (2016-11-29 00:00:00) ° °

## 2021-04-06 ENCOUNTER — Other Ambulatory Visit: Payer: Self-pay

## 2021-04-06 ENCOUNTER — Inpatient Hospital Stay: Payer: Medicare Other

## 2021-04-06 VITALS — BP 141/81 | HR 80 | Temp 98.1°F | Resp 18

## 2021-04-06 DIAGNOSIS — D461 Refractory anemia with ring sideroblasts: Secondary | ICD-10-CM | POA: Diagnosis not present

## 2021-04-06 DIAGNOSIS — D469 Myelodysplastic syndrome, unspecified: Secondary | ICD-10-CM

## 2021-04-06 LAB — CBC WITH DIFFERENTIAL (CANCER CENTER ONLY)
Abs Immature Granulocytes: 0.64 10*3/uL — ABNORMAL HIGH (ref 0.00–0.07)
Basophils Absolute: 0.3 10*3/uL — ABNORMAL HIGH (ref 0.0–0.1)
Basophils Relative: 2 %
Eosinophils Absolute: 0.3 10*3/uL (ref 0.0–0.5)
Eosinophils Relative: 3 %
HCT: 30.3 % — ABNORMAL LOW (ref 39.0–52.0)
Hemoglobin: 9.4 g/dL — ABNORMAL LOW (ref 13.0–17.0)
Immature Granulocytes: 6 %
Lymphocytes Relative: 14 %
Lymphs Abs: 1.6 10*3/uL (ref 0.7–4.0)
MCH: 27.1 pg (ref 26.0–34.0)
MCHC: 31 g/dL (ref 30.0–36.0)
MCV: 87.3 fL (ref 80.0–100.0)
Monocytes Absolute: 0.7 10*3/uL (ref 0.1–1.0)
Monocytes Relative: 6 %
Neutro Abs: 7.8 10*3/uL — ABNORMAL HIGH (ref 1.7–7.7)
Neutrophils Relative %: 69 %
Platelet Count: 402 10*3/uL — ABNORMAL HIGH (ref 150–400)
RBC: 3.47 MIL/uL — ABNORMAL LOW (ref 4.22–5.81)
RDW: 33.1 % — ABNORMAL HIGH (ref 11.5–15.5)
WBC Count: 11.3 10*3/uL — ABNORMAL HIGH (ref 4.0–10.5)
nRBC: 0.7 % — ABNORMAL HIGH (ref 0.0–0.2)

## 2021-04-06 MED ORDER — EPOETIN ALFA-EPBX 40000 UNIT/ML IJ SOLN
40000.0000 [IU] | Freq: Once | INTRAMUSCULAR | Status: AC
  Administered 2021-04-06: 10:00:00 40000 [IU] via SUBCUTANEOUS
  Filled 2021-04-06: qty 1

## 2021-04-07 ENCOUNTER — Ambulatory Visit: Payer: Medicare Other | Admitting: Neurology

## 2021-04-07 ENCOUNTER — Telehealth: Payer: Self-pay | Admitting: Neurology

## 2021-04-07 ENCOUNTER — Encounter: Payer: Self-pay | Admitting: Neurology

## 2021-04-07 VITALS — BP 137/70 | HR 77 | Ht 66.7 in | Wt 165.8 lb

## 2021-04-07 DIAGNOSIS — R413 Other amnesia: Secondary | ICD-10-CM | POA: Diagnosis not present

## 2021-04-07 MED ORDER — GABAPENTIN 100 MG PO CAPS: ORAL_CAPSULE | ORAL | 1 refills | Status: DC

## 2021-04-07 NOTE — Telephone Encounter (Signed)
The patient was seen today. This is a continuation of his therapy by Dr. Jannifer Franklin.

## 2021-04-07 NOTE — Telephone Encounter (Signed)
Pt is requesting a refill of his gabapentin (NEURONTIN) 100 MG capsule. He forgot to mention it during his appointment.

## 2021-04-07 NOTE — Progress Notes (Signed)
Chief Complaint  Patient presents with   Follow-up    Rm 12. Alone.  PCP is Roy Johnson. Patient states he is the same as last visit.   ASSESSMENT AND PLAN  Roy Johnson is a 75 y.o. male   Sudden onset left vision loss in June 2022  Differentiation diagnosis include right occipital versus left retinal artery vascular event,  Proceed with MRI of the brain  MRA of the brain and neck  Mild cognitive impairment  Mini-Mental Status Examination today 29/30  Laboratory evaluation to rule out treatable etiology  Continue moderate exercise Right foot paresthesia, right lumbar radiculopathy,  EMG nerve conduction study in January 2022 showed no large fiber peripheral neuropathy,  He is under the care of orthopedics/pain management, had MRI of lumbar spine from outside facility, some improvement with epidural injection  Excessive daytime sleepiness, fatigue,  If he continue complains of excessive daytime sleepiness, will refer him to sleep study  DIAGNOSTIC DATA (LABS, IMAGING, TESTING) - I reviewed patient records, labs, notes, testing and imaging myself where available.   MEDICAL HISTORY:  Roy Johnson is a 75 year old male, his primary care physician is Roy Johnson, Roy Johnson, return to follow-up for cognitive impairment, new onset left visual loss in June 2022  I reviewed and summarized the referring note. PMHx. 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  Still functioning daily activity, driving without difficulty, able to keep his checkbook imbalance, today's Mini-Mental  status 29/30  I personally reviewed MRI of the brain in 2019 which showed no significant abnormalities. Laboratory evaluation showed normal CMP, TSH, B12,  Today his main concern is 1 episode of sudden onset left visual loss in June 2022, lasting for 30 seconds, like a thick mesh covered his left eye, no headache, no lateralized motor or sensory deficit.  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, was on aspirin 81 mg daily, has frequent bruise, now has stopped taking aspirin,  Today he is also complaining of 2 years history of frequent extreme episode of daytime sleepiness, fatigue, forcing him to take a nap,  Also been dealing with right lumbar radiculopathy, see outside physician for epidural injection, had MRI of lumbar spine,  EMG nerve conduction study by Roy Johnson January 2022, no evidence of polyneuropathy, no evidence of active lumbar radiculopathy, mild right carpal tunnel  UPDATE Apr 07 2021: Is overall doing very well, no significant changes since last visit 6 months ago, no longer has sudden visual loss, complains of right lumbar radicular pain, mild gait difficulty due to that  We personally reviewed MRI of brain in November 2022, age-appropriate small vessel disease, no acute abnormality MRA of head and neck showed no large vessel disease,  He continues to complain of occasionally episodes of overwhelming sleepiness, fatigue episode, also complains of mild memory loss,   PHYSICAL EXAM:   Vitals:   04/07/21 1553  BP: 137/70  Pulse: 77  Weight: 165 lb 12.8 oz (75.2 kg)  Height: 5' 6.7" (1.694 m)   Not recorded     Body mass  index is 26.2 kg/m.  PHYSICAL EXAMNIATION:  Gen: NAD, conversant, well nourised, well groomed                     Cardiovascular: Regular rate rhythm, no peripheral edema, warm, nontender. Eyes: Conjunctivae clear without exudates or hemorrhage Neck:  Supple, no carotid bruits. Pulmonary: Clear to auscultation bilaterally   NEUROLOGICAL EXAM:  MENTAL STATUS: Speech:    Speech is normal; fluent and spontaneous with normal comprehension.  Cognition: Montreal Cognitive Assessment  04/07/2021  Visuospatial/ Executive (0/5) 5  Naming (0/3) 3  Attention: Read list of digits (0/2) 2  Attention: Read list of letters (0/1) 1  Attention: Serial 7 subtraction starting at 100 (0/3) 3  Language: Repeat phrase (0/2) 2  Language : Fluency (0/1) 1  Abstraction (0/2) 2  Delayed Recall (0/5) 4  Orientation (0/6) 6  Total 29      CRANIAL NERVES: CN II: Visual fields are full to confrontation. Pupils are round equal and briskly reactive to light. CN III, IV, VI: extraocular movement are normal. No ptosis. CN V: Facial sensation is intact to light touch CN VII: Face is symmetric with normal eye closure  CN VIII: Hearing is normal to causal conversation. CN IX, X: Phonation is normal. CN XI: Head turning and shoulder shrug are intact  MOTOR: There is no pronator drift of out-stretched arms. Muscle bulk and tone are normal. Muscle strength is normal.  REFLEXES: Reflexes are 2+ and symmetric at the biceps, triceps, knees, and absent at ankles. Plantar responses are flexor.  SENSORY: Decreased to right toe vibratory sensation light touch,  COORDINATION: There is no trunk or limb dysmetria noted.  GAIT/STANCE: Need push-up to get up from seated position, antalgic, dragging right leg,  REVIEW OF SYSTEMS:  Full 14 system review of systems performed and notable only for as above All other review of systems were negative.   ALLERGIES: No Known Allergies  HOME MEDICATIONS: Current Outpatient Medications  Medication Sig Dispense Refill   amLODipine (NORVASC) 5 MG tablet Take 5 mg by mouth daily.  2   anagrelide (AGRYLIN) 1 MG capsule TAKE 1 CAPSULE(1 MG) BY MOUTH DAILY 90 capsule 0   cetirizine (ZYRTEC) 10 MG tablet TAKE 1 TABLET(10 MG)  BY MOUTH DAILY     epoetin alfa-epbx (RETACRIT) 32440 UNIT/ML injection Inject into the skin.     gabapentin (NEURONTIN) 100 MG capsule TAKE 1 CAPSULE BY MOUTH TWICE DAILY AND 2 AT NIGHT 120 capsule 1   hyoscyamine (LEVSIN SL) 0.125 MG SL tablet Place 1 tablet (0.125 mg total) under the tongue every 4 (four) hours as needed. 90 tablet 3   lenalidomide (REVLIMID) 5 MG capsule Take 5 mg by mouth daily.     loperamide (IMODIUM) 2 MG capsule 1 tablet.     Multiple Vitamin (MULTI-VITAMINS) TABS Take 1 tablet by mouth daily.     ondansetron (ZOFRAN) 8 MG tablet TAKE 1 TABLET(8 MG) BY MOUTH EVERY 8 HOURS AS NEEDED FOR NAUSEA OR VOMITING 20 tablet 0   pantoprazole (PROTONIX) 40 MG tablet Take 1 tablet (40 mg total) by mouth daily. 90 tablet 3   Probiotic Product (ALIGN) 4 MG CAPS Take 1 capsule by mouth daily.     vitamin B-12 (CYANOCOBALAMIN) 1000 MCG tablet Take 1 tablet by mouth daily as needed.     No current facility-administered medications for this visit.   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   Numbness and tingling in both hands    Prostate cancer (HCC)    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)    Umbilical hernia    Vitamin D deficiency     PAST SURGICAL HISTORY: Past Surgical History:  Procedure Laterality Date   COLONOSCOPY     POLYPECTOMY     PROSTATECTOMY     TONSILLECTOMY      FAMILY HISTORY: Family History  Problem Relation Age of Onset   Heart disease Father    Colon polyps Father    Lung cancer Father         cancerous mole   Prostate cancer Father    Osteoarthritis Mother    Breast cancer Mother    Colon cancer Neg Hx    Esophageal cancer Neg Hx    Rectal cancer Neg Hx    Stomach cancer Neg Hx     SOCIAL HISTORY: Social History   Socioeconomic History   Marital status: Married    Spouse name: Not on file   Number of children: 2   Years of education: college   Highest education level: Not on file  Occupational History   Occupation: retired  Tobacco Use   Smoking status: Former    Types: Cigarettes    Quit date: 09/07/1973    Years since quitting: 47.6   Smokeless tobacco: Never  Vaping Use   Vaping Use: Never used  Substance and Sexual Activity   Alcohol use: Yes    Alcohol/week: 7.0 standard drinks    Types: 7 Standard drinks or equivalent per week   Drug use: No   Sexual activity: Not on file  Other Topics Concern   Not on file  Social History Narrative   Lives with wife.   Right-handed.   1 cup caffeine per day.   Social Determinants of Health   Financial Resource Strain: Not on file  Food Insecurity: Not on file  Transportation Needs: Not on file  Physical Activity: Not on file  Stress: Not on file  Social Connections: Not on file  Intimate Partner Violence: Not on file    Total time spent reviewing the chart, obtaining history, examined patient, ordering tests, documentation, consultations and family, care coordination was 46 minutes     Marcial Pacas, M.D. Ph.D.  [facility] excessive daytime sleepiness [address], [place], [place] [address] Ph: [phone] Fax: [phone]  CC:  Roy Battles, MD [place],  [place] [address]  Roy Battles, MD

## 2021-04-07 NOTE — Addendum Note (Signed)
Addended by: Noberto Retort C on: 04/07/2021 05:00 PM   Modules accepted: Orders

## 2021-04-08 LAB — RPR: RPR Ser Ql: NONREACTIVE

## 2021-04-08 LAB — TSH: TSH: 2.44 u[IU]/mL (ref 0.450–4.500)

## 2021-04-08 LAB — VITAMIN B12: Vitamin B-12: 1613 pg/mL — ABNORMAL HIGH (ref 232–1245)

## 2021-04-13 ENCOUNTER — Inpatient Hospital Stay: Payer: Medicare Other

## 2021-04-13 ENCOUNTER — Inpatient Hospital Stay: Payer: Medicare Other | Attending: Physician Assistant

## 2021-04-13 ENCOUNTER — Other Ambulatory Visit: Payer: Self-pay

## 2021-04-13 VITALS — BP 145/74 | HR 77 | Temp 98.0°F | Resp 18

## 2021-04-13 DIAGNOSIS — R109 Unspecified abdominal pain: Secondary | ICD-10-CM | POA: Diagnosis not present

## 2021-04-13 DIAGNOSIS — Z8546 Personal history of malignant neoplasm of prostate: Secondary | ICD-10-CM | POA: Diagnosis not present

## 2021-04-13 DIAGNOSIS — K219 Gastro-esophageal reflux disease without esophagitis: Secondary | ICD-10-CM | POA: Diagnosis not present

## 2021-04-13 DIAGNOSIS — D461 Refractory anemia with ring sideroblasts: Secondary | ICD-10-CM | POA: Insufficient documentation

## 2021-04-13 DIAGNOSIS — Z79899 Other long term (current) drug therapy: Secondary | ICD-10-CM | POA: Insufficient documentation

## 2021-04-13 DIAGNOSIS — Z8719 Personal history of other diseases of the digestive system: Secondary | ICD-10-CM | POA: Insufficient documentation

## 2021-04-13 DIAGNOSIS — D473 Essential (hemorrhagic) thrombocythemia: Secondary | ICD-10-CM | POA: Diagnosis not present

## 2021-04-13 DIAGNOSIS — Z7961 Long term (current) use of immunomodulator: Secondary | ICD-10-CM | POA: Diagnosis not present

## 2021-04-13 DIAGNOSIS — D469 Myelodysplastic syndrome, unspecified: Secondary | ICD-10-CM

## 2021-04-13 DIAGNOSIS — D72829 Elevated white blood cell count, unspecified: Secondary | ICD-10-CM | POA: Diagnosis not present

## 2021-04-13 DIAGNOSIS — Z9079 Acquired absence of other genital organ(s): Secondary | ICD-10-CM | POA: Diagnosis not present

## 2021-04-13 DIAGNOSIS — R5383 Other fatigue: Secondary | ICD-10-CM | POA: Insufficient documentation

## 2021-04-13 LAB — CBC WITH DIFFERENTIAL (CANCER CENTER ONLY)
Abs Immature Granulocytes: 1.07 10*3/uL — ABNORMAL HIGH (ref 0.00–0.07)
Basophils Absolute: 0.2 10*3/uL — ABNORMAL HIGH (ref 0.0–0.1)
Basophils Relative: 1 %
Eosinophils Absolute: 0.7 10*3/uL — ABNORMAL HIGH (ref 0.0–0.5)
Eosinophils Relative: 5 %
HCT: 32.6 % — ABNORMAL LOW (ref 39.0–52.0)
Hemoglobin: 10.2 g/dL — ABNORMAL LOW (ref 13.0–17.0)
Immature Granulocytes: 8 %
Lymphocytes Relative: 14 %
Lymphs Abs: 2 10*3/uL (ref 0.7–4.0)
MCH: 27.1 pg (ref 26.0–34.0)
MCHC: 31.3 g/dL (ref 30.0–36.0)
MCV: 86.7 fL (ref 80.0–100.0)
Monocytes Absolute: 0.7 10*3/uL (ref 0.1–1.0)
Monocytes Relative: 5 %
Neutro Abs: 9.7 10*3/uL — ABNORMAL HIGH (ref 1.7–7.7)
Neutrophils Relative %: 67 %
Platelet Count: 294 10*3/uL (ref 150–400)
RBC: 3.76 MIL/uL — ABNORMAL LOW (ref 4.22–5.81)
RDW: 33.2 % — ABNORMAL HIGH (ref 11.5–15.5)
WBC Count: 14.3 10*3/uL — ABNORMAL HIGH (ref 4.0–10.5)
nRBC: 0.3 % — ABNORMAL HIGH (ref 0.0–0.2)

## 2021-04-13 LAB — CMP (CANCER CENTER ONLY)
ALT: 21 U/L (ref 0–44)
AST: 15 U/L (ref 15–41)
Albumin: 4.3 g/dL (ref 3.5–5.0)
Alkaline Phosphatase: 67 U/L (ref 38–126)
Anion gap: 8 (ref 5–15)
BUN: 22 mg/dL (ref 8–23)
CO2: 26 mmol/L (ref 22–32)
Calcium: 9.3 mg/dL (ref 8.9–10.3)
Chloride: 106 mmol/L (ref 98–111)
Creatinine: 1.36 mg/dL — ABNORMAL HIGH (ref 0.61–1.24)
GFR, Estimated: 54 mL/min — ABNORMAL LOW (ref >60)
Glucose, Bld: 94 mg/dL (ref 70–99)
Potassium: 4.2 mmol/L (ref 3.5–5.1)
Sodium: 140 mmol/L (ref 135–145)
Total Bilirubin: 1 mg/dL (ref 0.3–1.2)
Total Protein: 7.4 g/dL (ref 6.5–8.1)

## 2021-04-13 LAB — LACTATE DEHYDROGENASE: LDH: 265 U/L — ABNORMAL HIGH (ref 98–192)

## 2021-04-13 MED ORDER — EPOETIN ALFA-EPBX 40000 UNIT/ML IJ SOLN
40000.0000 [IU] | Freq: Once | INTRAMUSCULAR | Status: AC
  Administered 2021-04-13: 40000 [IU] via SUBCUTANEOUS
  Filled 2021-04-13: qty 1

## 2021-04-19 ENCOUNTER — Inpatient Hospital Stay: Payer: Medicare Other

## 2021-04-19 ENCOUNTER — Other Ambulatory Visit: Payer: Self-pay

## 2021-04-19 ENCOUNTER — Inpatient Hospital Stay (HOSPITAL_BASED_OUTPATIENT_CLINIC_OR_DEPARTMENT_OTHER): Payer: Medicare Other | Admitting: Internal Medicine

## 2021-04-19 ENCOUNTER — Encounter: Payer: Self-pay | Admitting: Internal Medicine

## 2021-04-19 VITALS — BP 134/69 | HR 76 | Temp 98.4°F | Resp 17 | Ht 66.7 in | Wt 162.6 lb

## 2021-04-19 DIAGNOSIS — D473 Essential (hemorrhagic) thrombocythemia: Secondary | ICD-10-CM

## 2021-04-19 DIAGNOSIS — D469 Myelodysplastic syndrome, unspecified: Secondary | ICD-10-CM | POA: Diagnosis not present

## 2021-04-19 DIAGNOSIS — D461 Refractory anemia with ring sideroblasts: Secondary | ICD-10-CM | POA: Diagnosis not present

## 2021-04-19 LAB — CBC WITH DIFFERENTIAL (CANCER CENTER ONLY)
Abs Immature Granulocytes: 0.46 10*3/uL — ABNORMAL HIGH (ref 0.00–0.07)
Basophils Absolute: 0.2 10*3/uL — ABNORMAL HIGH (ref 0.0–0.1)
Basophils Relative: 1 %
Eosinophils Absolute: 1.2 10*3/uL — ABNORMAL HIGH (ref 0.0–0.5)
Eosinophils Relative: 8 %
HCT: 32.6 % — ABNORMAL LOW (ref 39.0–52.0)
Hemoglobin: 10.1 g/dL — ABNORMAL LOW (ref 13.0–17.0)
Immature Granulocytes: 3 %
Lymphocytes Relative: 14 %
Lymphs Abs: 2 10*3/uL (ref 0.7–4.0)
MCH: 26.9 pg (ref 26.0–34.0)
MCHC: 31 g/dL (ref 30.0–36.0)
MCV: 86.9 fL (ref 80.0–100.0)
Monocytes Absolute: 1.2 10*3/uL — ABNORMAL HIGH (ref 0.1–1.0)
Monocytes Relative: 8 %
Neutro Abs: 9.5 10*3/uL — ABNORMAL HIGH (ref 1.7–7.7)
Neutrophils Relative %: 66 %
Platelet Count: 278 10*3/uL (ref 150–400)
RBC: 3.75 MIL/uL — ABNORMAL LOW (ref 4.22–5.81)
RDW: 33.1 % — ABNORMAL HIGH (ref 11.5–15.5)
WBC Count: 14.5 10*3/uL — ABNORMAL HIGH (ref 4.0–10.5)
nRBC: 0.2 % (ref 0.0–0.2)

## 2021-04-19 LAB — LACTATE DEHYDROGENASE: LDH: 222 U/L — ABNORMAL HIGH (ref 98–192)

## 2021-04-19 MED ORDER — EPOETIN ALFA-EPBX 40000 UNIT/ML IJ SOLN
40000.0000 [IU] | Freq: Once | INTRAMUSCULAR | Status: AC
  Administered 2021-04-19: 40000 [IU] via SUBCUTANEOUS
  Filled 2021-04-19: qty 1

## 2021-04-19 NOTE — Progress Notes (Signed)
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   PRIOR THERAPY: Previous treatment with combination of Hydrea and anagrelide.  CURRENT THERAPY:  1) Revlimid 5 mg by mouth daily 2 weeks on and 2 weeks off.  Started 03/25/2016.  Managed by Dr. Evelene Croon at [facility] 2) anagrelide 0.5 mg p.o. twice daily. 3) Aranesp 300 mcg subcutaneously every 3 weeks.  This is switched to Procrit 400 mcg subcutaneously every 2 weeks starting April 13, 2017.  The frequency of the Procrit was changed to weekly schedule when he was in [place].  He is currently on Retacrit weekly as a biosimilar to Procrit.  INTERVAL HISTORY: Roy Johnson 76 y.o. male returns to the clinic today for follow-up visit.  The patient is feeling fine today with no concerning complaints except for mild fatigue and intermittent abdominal pain.  He denied having any current chest pain, shortness of breath, cough or hemoptysis.  He denied having any fever or chills.  He has no nausea, vomiting, diarrhea or constipation.  He has no headache or visual changes.  He continues to tolerate his treatment with Retacrit fairly well.  The patient is planning to travel to [place] for the next 4 months.  He has a hematologist in [place] who would resume his treatment with Retacrit during that time.  He is here today for evaluation and injection before his travel.  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  MEDICATIONS:  Current Outpatient Medications  Medication Sig Dispense Refill   amLODipine (NORVASC) 5 MG tablet Take 5 mg by mouth daily.  2   anagrelide (AGRYLIN) 1 MG capsule TAKE 1 CAPSULE(1 MG) BY MOUTH DAILY 90 capsule 0   cetirizine (ZYRTEC) 10 MG tablet TAKE 1 TABLET(10 MG) BY MOUTH DAILY     epoetin alfa-epbx (RETACRIT) 63893 UNIT/ML injection Inject into the skin.     gabapentin (NEURONTIN) 100 MG capsule TAKE 1 CAPSULE BY MOUTH TWICE DAILY AND 2 AT NIGHT 360 capsule 1   hyoscyamine (LEVSIN SL) 0.125 MG SL tablet Place 1 tablet (0.125 mg total) under the tongue every 4 (four) hours as needed. 90 tablet 3   lenalidomide (REVLIMID) 5 MG capsule Take 5 mg by mouth daily.     loperamide (IMODIUM) 2 MG capsule 1 tablet.     Multiple Vitamin (MULTI-VITAMINS) TABS Take 1 tablet by mouth daily.     ondansetron (ZOFRAN) 8 MG tablet TAKE 1 TABLET(8 MG) BY MOUTH EVERY 8 HOURS AS NEEDED FOR NAUSEA OR VOMITING 20 tablet 0   pantoprazole (PROTONIX) 40 MG tablet Take 1 tablet (40 mg total) by mouth daily. 90 tablet 3   Probiotic Product (ALIGN) 4 MG CAPS Take 1 capsule by mouth daily.  vitamin B-12 (CYANOCOBALAMIN) 1000 MCG tablet Take 1 tablet by mouth daily as needed.     No current facility-administered medications for this visit.   Facility-Administered Medications Ordered in Other Visits  Medication Dose Route Frequency Provider Last Rate Last Admin   0.9 %  sodium chloride infusion  250 mL Intravenous Once Curt Bears, MD        SURGICAL HISTORY:  Past Surgical History:  Procedure Laterality Date   COLONOSCOPY     POLYPECTOMY     PROSTATECTOMY     TONSILLECTOMY      REVIEW OF SYSTEMS:  A comprehensive review of systems was negative except for: Constitutional: positive for  fatigue Gastrointestinal: positive for abdominal pain   PHYSICAL EXAMINATION: General appearance: alert, cooperative, appears stated age, fatigued, and no distress Head: Normocephalic, without obvious abnormality, atraumatic Neck: no adenopathy, no JVD, supple, symmetrical, trachea midline, and thyroid not enlarged, symmetric, no tenderness/mass/nodules Lymph nodes: Cervical, supraclavicular, and axillary nodes normal. Resp: clear to auscultation bilaterally Back: symmetric, no curvature. ROM normal. No CVA tenderness. Cardio: regular rate and rhythm, S1, S2 normal, no murmur, click, rub or gallop GI: soft, non-tender; bowel sounds normal; no masses,  no organomegaly Extremities: extremities normal, atraumatic, no cyanosis or edema  ECOG PERFORMANCE STATUS: 1 - Symptomatic but completely ambulatory  Blood pressure 134/69, pulse 76, temperature 98.4 F (36.9 C), temperature source Axillary, resp. rate 17, height 5' 6.7" (1.694 m), weight 162 lb 9.6 oz (73.8 kg), SpO2 100 %.  LABORATORY DATA:  Lab Results  Component Value Date   WBC 14.5 (H) 04/19/2021   HGB 10.1 (L) 04/19/2021   HCT 32.6 (L) 04/19/2021   MCV 86.9 04/19/2021   PLT 278 04/19/2021      Chemistry      Component Value Date/Time   NA 140 04/13/2021 0922   NA 141 12/08/2016 0928   K 4.2 04/13/2021 0922   K 4.5 12/08/2016 0928   CL 106 04/13/2021 0922   CO2 26 04/13/2021 0922   CO2 27 12/08/2016 0928   BUN 22 04/13/2021 0922   BUN 14.1 12/08/2016 0928   CREATININE 1.36 (H) 04/13/2021 0922   CREATININE 0.9 12/08/2016 0928      Component Value Date/Time   CALCIUM 9.3 04/13/2021 0922   CALCIUM 9.0 12/08/2016 0928   ALKPHOS 67 04/13/2021 0922   ALKPHOS 87 12/08/2016 0928   AST 15 04/13/2021 0922   AST 25 12/08/2016 0928   ALT 21 04/13/2021 0922   ALT 41 12/08/2016 0928   BILITOT 1.0 04/13/2021 0922   BILITOT 0.84 12/08/2016 0928      RADIOGRAPHIC STUDIES:  ASSESSMENT AND PLAN:  This is a very pleasant  76 years old white male with essential thrombocythemia with positive JAK-2 mutation as well as myelodysplastic syndrome. He is currently on treatment with Revlimid 5 mg by mouth daily according to the recommendation from [facility].  He is also on treatment with Aranesp every 3 weeks.  Aranesp was a change it to Procrit initially every 2 weeks and currently on weekly basis.  He mentioned that he is feeling much better on Procrit weekly  The patient has been on treatment with Retacrit 40,000 unit weekly for the last 2 years.  He continues to tolerate this treatment well. The patient is doing fine today with no concerning complaints except for the mild fatigue. I recommended for him to proceed with his treatment with Retacrit today as planned. He will come back for follow-up visit in  around 4 months for evaluation with resuming his injection and blood work after he returns back from [place]. He was advised to call immediately if he has any other concerning symptoms in the interval.   All questions were answered. The patient knows to call the clinic with any problems, questions or concerns. We can certainly see the patient much sooner if necessary.   Disclaimer: This note was dictated with voice recognition software. Similar sounding words can inadvertently be transcribed and may not be corrected upon review.

## 2021-07-26 ENCOUNTER — Other Ambulatory Visit: Payer: Self-pay | Admitting: Medical Oncology

## 2021-07-26 ENCOUNTER — Telehealth: Payer: Self-pay | Admitting: Internal Medicine

## 2021-07-26 DIAGNOSIS — D469 Myelodysplastic syndrome, unspecified: Secondary | ICD-10-CM

## 2021-07-26 NOTE — Telephone Encounter (Signed)
Called patient regarding upcoming appointments, patient is notified. 

## 2021-08-10 ENCOUNTER — Other Ambulatory Visit: Payer: Self-pay

## 2021-08-10 ENCOUNTER — Inpatient Hospital Stay: Payer: Medicare Other

## 2021-08-10 ENCOUNTER — Inpatient Hospital Stay: Payer: Medicare Other | Attending: Internal Medicine

## 2021-08-10 VITALS — BP 133/63 | HR 74 | Temp 97.9°F | Resp 18

## 2021-08-10 DIAGNOSIS — R5383 Other fatigue: Secondary | ICD-10-CM | POA: Diagnosis not present

## 2021-08-10 DIAGNOSIS — R197 Diarrhea, unspecified: Secondary | ICD-10-CM | POA: Diagnosis not present

## 2021-08-10 DIAGNOSIS — K5900 Constipation, unspecified: Secondary | ICD-10-CM | POA: Diagnosis not present

## 2021-08-10 DIAGNOSIS — K219 Gastro-esophageal reflux disease without esophagitis: Secondary | ICD-10-CM | POA: Diagnosis not present

## 2021-08-10 DIAGNOSIS — D469 Myelodysplastic syndrome, unspecified: Secondary | ICD-10-CM

## 2021-08-10 DIAGNOSIS — Z79899 Other long term (current) drug therapy: Secondary | ICD-10-CM | POA: Diagnosis not present

## 2021-08-10 DIAGNOSIS — D473 Essential (hemorrhagic) thrombocythemia: Secondary | ICD-10-CM | POA: Insufficient documentation

## 2021-08-10 DIAGNOSIS — D461 Refractory anemia with ring sideroblasts: Secondary | ICD-10-CM | POA: Diagnosis not present

## 2021-08-10 DIAGNOSIS — D72829 Elevated white blood cell count, unspecified: Secondary | ICD-10-CM | POA: Insufficient documentation

## 2021-08-10 DIAGNOSIS — R109 Unspecified abdominal pain: Secondary | ICD-10-CM | POA: Diagnosis not present

## 2021-08-10 LAB — CBC WITH DIFFERENTIAL (CANCER CENTER ONLY)
Abs Immature Granulocytes: 0.53 10*3/uL — ABNORMAL HIGH (ref 0.00–0.07)
Basophils Absolute: 0.2 10*3/uL — ABNORMAL HIGH (ref 0.0–0.1)
Basophils Relative: 1 %
Eosinophils Absolute: 1.1 10*3/uL — ABNORMAL HIGH (ref 0.0–0.5)
Eosinophils Relative: 8 %
HCT: 32.4 % — ABNORMAL LOW (ref 39.0–52.0)
Hemoglobin: 10.1 g/dL — ABNORMAL LOW (ref 13.0–17.0)
Immature Granulocytes: 4 %
Lymphocytes Relative: 13 %
Lymphs Abs: 1.8 10*3/uL (ref 0.7–4.0)
MCH: 26.6 pg (ref 26.0–34.0)
MCHC: 31.2 g/dL (ref 30.0–36.0)
MCV: 85.3 fL (ref 80.0–100.0)
Monocytes Absolute: 1 10*3/uL (ref 0.1–1.0)
Monocytes Relative: 8 %
Neutro Abs: 8.9 10*3/uL — ABNORMAL HIGH (ref 1.7–7.7)
Neutrophils Relative %: 66 %
Platelet Count: 257 10*3/uL (ref 150–400)
RBC: 3.8 MIL/uL — ABNORMAL LOW (ref 4.22–5.81)
RDW: 33.9 % — ABNORMAL HIGH (ref 11.5–15.5)
WBC Count: 13.4 10*3/uL — ABNORMAL HIGH (ref 4.0–10.5)
nRBC: 0 % (ref 0.0–0.2)

## 2021-08-10 LAB — CMP (CANCER CENTER ONLY)
ALT: 19 U/L (ref 0–44)
AST: 19 U/L (ref 15–41)
Albumin: 4.4 g/dL (ref 3.5–5.0)
Alkaline Phosphatase: 54 U/L (ref 38–126)
Anion gap: 3 — ABNORMAL LOW (ref 5–15)
BUN: 21 mg/dL (ref 8–23)
CO2: 28 mmol/L (ref 22–32)
Calcium: 9.3 mg/dL (ref 8.9–10.3)
Chloride: 110 mmol/L (ref 98–111)
Creatinine: 1.17 mg/dL (ref 0.61–1.24)
GFR, Estimated: >60 mL/min (ref >60)
Glucose, Bld: 134 mg/dL — ABNORMAL HIGH (ref 70–99)
Potassium: 4 mmol/L (ref 3.5–5.1)
Sodium: 141 mmol/L (ref 135–145)
Total Bilirubin: 0.9 mg/dL (ref 0.3–1.2)
Total Protein: 7.1 g/dL (ref 6.5–8.1)

## 2021-08-10 LAB — LACTATE DEHYDROGENASE: LDH: 268 U/L — ABNORMAL HIGH (ref 98–192)

## 2021-08-10 MED ORDER — EPOETIN ALFA-EPBX 40000 UNIT/ML IJ SOLN
40000.0000 [IU] | Freq: Once | INTRAMUSCULAR | Status: AC
  Administered 2021-08-10: 40000 [IU] via SUBCUTANEOUS
  Filled 2021-08-10: qty 1

## 2021-08-16 ENCOUNTER — Other Ambulatory Visit: Payer: Self-pay

## 2021-08-16 ENCOUNTER — Inpatient Hospital Stay: Payer: Medicare Other | Admitting: Internal Medicine

## 2021-08-16 ENCOUNTER — Encounter: Payer: Self-pay | Admitting: Internal Medicine

## 2021-08-16 ENCOUNTER — Inpatient Hospital Stay: Payer: Medicare Other

## 2021-08-16 VITALS — BP 136/77 | HR 73 | Temp 97.5°F | Resp 16 | Wt 165.4 lb

## 2021-08-16 DIAGNOSIS — T451X5A Adverse effect of antineoplastic and immunosuppressive drugs, initial encounter: Secondary | ICD-10-CM | POA: Diagnosis not present

## 2021-08-16 DIAGNOSIS — D469 Myelodysplastic syndrome, unspecified: Secondary | ICD-10-CM

## 2021-08-16 DIAGNOSIS — D6481 Anemia due to antineoplastic chemotherapy: Secondary | ICD-10-CM | POA: Diagnosis not present

## 2021-08-16 DIAGNOSIS — D473 Essential (hemorrhagic) thrombocythemia: Secondary | ICD-10-CM | POA: Diagnosis not present

## 2021-08-16 DIAGNOSIS — D461 Refractory anemia with ring sideroblasts: Secondary | ICD-10-CM | POA: Diagnosis not present

## 2021-08-16 LAB — CBC WITH DIFFERENTIAL (CANCER CENTER ONLY)
Abs Immature Granulocytes: 0.12 10*3/uL — ABNORMAL HIGH (ref 0.00–0.07)
Basophils Absolute: 0.2 10*3/uL — ABNORMAL HIGH (ref 0.0–0.1)
Basophils Relative: 2 %
Eosinophils Absolute: 0.6 10*3/uL — ABNORMAL HIGH (ref 0.0–0.5)
Eosinophils Relative: 8 %
HCT: 30.2 % — ABNORMAL LOW (ref 39.0–52.0)
Hemoglobin: 9.7 g/dL — ABNORMAL LOW (ref 13.0–17.0)
Immature Granulocytes: 1 %
Lymphocytes Relative: 17 %
Lymphs Abs: 1.5 10*3/uL (ref 0.7–4.0)
MCH: 26.9 pg (ref 26.0–34.0)
MCHC: 32.1 g/dL (ref 30.0–36.0)
MCV: 83.9 fL (ref 80.0–100.0)
Monocytes Absolute: 0.7 10*3/uL (ref 0.1–1.0)
Monocytes Relative: 8 %
Neutro Abs: 5.4 10*3/uL (ref 1.7–7.7)
Neutrophils Relative %: 64 %
Platelet Count: 235 10*3/uL (ref 150–400)
RBC: 3.6 MIL/uL — ABNORMAL LOW (ref 4.22–5.81)
RDW: 33.6 % — ABNORMAL HIGH (ref 11.5–15.5)
WBC Count: 8.5 10*3/uL (ref 4.0–10.5)
WBC Morphology: INCREASED
nRBC: 0 % (ref 0.0–0.2)

## 2021-08-16 MED ORDER — EPOETIN ALFA-EPBX 40000 UNIT/ML IJ SOLN
40000.0000 [IU] | Freq: Once | INTRAMUSCULAR | Status: AC
  Administered 2021-08-16: 40000 [IU] via SUBCUTANEOUS
  Filled 2021-08-16: qty 1

## 2021-08-16 NOTE — Patient Instructions (Signed)
Epoetin Alfa injection ?What is this medication? ?EPOETIN ALFA (e POE e tin AL fa) helps your body make more red blood cells. This medicine is used to treat anemia caused by chronic kidney disease, cancer chemotherapy, or HIV-therapy. It may also be used before surgery if you have anemia. ?This medicine may be used for other purposes; ask your health care provider or pharmacist if you have questions. ?COMMON BRAND NAME(S): Epogen, Procrit, Retacrit ?What should I tell my care team before I take this medication? ?They need to know if you have any of these conditions: ?cancer ?heart disease ?high blood pressure ?history of blood clots ?history of stroke ?low levels of folate, iron, or vitamin B12 in the blood ?seizures ?an unusual or allergic reaction to erythropoietin, albumin, benzyl alcohol, hamster proteins, other medicines, foods, dyes, or preservatives ?pregnant or trying to get pregnant ?breast-feeding ?How should I use this medication? ?This medicine is for injection into a vein or under the skin. It is usually given by a health care professional in a hospital or clinic setting. ?If you get this medicine at home, you will be taught how to prepare and give this medicine. Use exactly as directed. Take your medicine at regular intervals. Do not take your medicine more often than directed. ?It is important that you put your used needles and syringes in a special sharps container. Do not put them in a trash can. If you do not have a sharps container, call your pharmacist or healthcare provider to get one. ?A special MedGuide will be given to you by the pharmacist with each prescription and refill. Be sure to read this information carefully each time. ?Talk to your pediatrician regarding the use of this medicine in children. While this drug may be prescribed for selected conditions, precautions do apply. ?Overdosage: If you think you have taken too much of this medicine contact a poison control center or emergency  room at once. ?NOTE: This medicine is only for you. Do not share this medicine with others. ?What if I miss a dose? ?If you miss a dose, take it as soon as you can. If it is almost time for your next dose, take only that dose. Do not take double or extra doses. ?What may interact with this medication? ?Interactions have not been studied. ?This list may not describe all possible interactions. Give your health care provider a list of all the medicines, herbs, non-prescription drugs, or dietary supplements you use. Also tell them if you smoke, drink alcohol, or use illegal drugs. Some items may interact with your medicine. ?What should I watch for while using this medication? ?Your condition will be monitored carefully while you are receiving this medicine. ?You may need blood work done while you are taking this medicine. ?This medicine may cause a decrease in vitamin B6. You should make sure that you get enough vitamin B6 while you are taking this medicine. Discuss the foods you eat and the vitamins you take with your health care professional. ?What side effects may I notice from receiving this medication? ?Side effects that you should report to your doctor or health care professional as soon as possible: ?allergic reactions like skin rash, itching or hives, swelling of the face, lips, or tongue ?seizures ?signs and symptoms of a blood clot such as breathing problems; changes in vision; chest pain; severe, sudden headache; pain, swelling, warmth in the leg; trouble speaking; sudden numbness or weakness of the face, arm or leg ?signs and symptoms of a stroke like   changes in vision; confusion; trouble speaking or understanding; severe headaches; sudden numbness or weakness of the face, arm or leg; trouble walking; dizziness; loss of balance or coordination ?Side effects that usually do not require medical attention (report to your doctor or health care professional if they continue or are  bothersome): ?chills ?cough ?dizziness ?fever ?headaches ?joint pain ?muscle cramps ?muscle pain ?nausea, vomiting ?pain, redness, or irritation at site where injected ?This list may not describe all possible side effects. Call your doctor for medical advice about side effects. You may report side effects to FDA at 1-800-FDA-1088. ?Where should I keep my medication? ?Keep out of the reach of children. ?Store in a refrigerator between 2 and 8 degrees C (36 and 46 degrees F). Do not freeze or shake. Throw away any unused portion if using a single-dose vial. Multi-dose vials can be kept in the refrigerator for up to 21 days after the initial dose. Throw away unused medicine. ?NOTE: This sheet is a summary. It may not cover all possible information. If you have questions about this medicine, talk to your doctor, pharmacist, or health care provider. ?? 2023 Elsevier/Gold Standard (2016-11-29 00:00:00) ? ?

## 2021-08-16 NOTE — Progress Notes (Signed)
?[place]  ?[phone] [phone] ? ?OFFICE PROGRESS NOTE ? ?[name], MD ?[address] ?[place] [address] ? ?DIAGNOSIS: ? 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  ? ?PRIOR THERAPY: Previous treatment with combination of Hydrea and anagrelide. ? ?CURRENT THERAPY:  ?1) Revlimid 5 mg by mouth daily 2 weeks on and 2 weeks off.  Started 03/25/2016.  Managed by Dr. Evelene Croon at [facility] ?2) anagrelide 0.5 mg p.o. twice daily. ?3) Aranesp 300 mcg subcutaneously every 3 weeks.  This is switched to Procrit 400 mcg subcutaneously every 2 weeks starting April 13, 2017.  The frequency of the Procrit was changed to weekly schedule when he was in [place].  He is currently on Retacrit weekly as a biosimilar to Procrit. ? ?INTERVAL HISTORY: ?Roy Johnson 76 y.o. male returns to the clinic today for follow-up visit after the patient returned back to [place] from [place].  He has been there for several months.  He has been receiving his treatment with Retacrit in [place] on a weekly basis.  He denied having any current chest pain, shortness of breath, cough or hemoptysis.  He has no nausea, vomiting but has few episodes of diarrhea followed by constipation and abdominal distention.  He is followed by Dr. Henrene Pastor from [facility] gastroenterology.  He has no headache or visual changes.  He continues to tolerate his treatment with Revlimid, and anagrelide and Retacrit fairly well.  He is here today for evaluation and repeat blood work. ? ?MEDICAL HISTORY: ?Past Medical History:  ?Diagnosis Date  ? Anemia   ? Colon polyp   ? Diverticulitis   ? Diverticulosis   ? GERD (gastroesophageal reflux disease)   ? Hyperlipidemia   ? IBS (irritable bowel syndrome)   ? Inguinal hernia   ? 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 Numbness and tingling  in both hands   ? Prostate cancer ([place])   ? 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)   ? Umbilical hernia   ? Vitamin D deficiency   ? ? ?ALLERGIES:  has No Known Allergies. ? ?MEDICATIONS:  ?Current Outpatient Medications  ?Medication Sig Dispense Refill  ? amLODipine (NORVASC) 5 MG tablet Take 5 mg by mouth daily.  2  ? anagrelide (AGRYLIN) 1 MG capsule TAKE 1 CAPSULE(1 MG) BY MOUTH DAILY 90 capsule 0  ? cetirizine (ZYRTEC) 10 MG tablet TAKE 1 TABLET(10 MG) BY MOUTH DAILY    ? epoetin alfa-epbx (RETACRIT) 70350 UNIT/ML injection Inject into the skin.    ? gabapentin (NEURONTIN) 100 MG capsule TAKE 1 CAPSULE BY MOUTH TWICE DAILY AND 2 AT NIGHT 360 capsule 1  ? hyoscyamine (LEVSIN SL) 0.125 MG SL tablet Place 1 tablet (0.125 mg total) under the tongue every 4 (four) hours as needed. 90 tablet 3  ? lenalidomide (REVLIMID) 5 MG capsule Take 5 mg by mouth daily.    ? loperamide (IMODIUM) 2 MG capsule 1 tablet.    ? Multiple Vitamin (MULTI-VITAMINS) TABS Take 1 tablet by mouth daily.    ? ondansetron (ZOFRAN) 8 MG tablet TAKE 1 TABLET(8 MG) BY MOUTH EVERY 8 HOURS AS NEEDED FOR NAUSEA OR VOMITING 20 tablet 0  ? pantoprazole (PROTONIX) 40 MG tablet Take 1 tablet (40 mg total) by mouth daily. 90 tablet 3  ? Probiotic Product (ALIGN) 4 MG CAPS Take 1 capsule by mouth daily.    ? vitamin B-12 (CYANOCOBALAMIN)  1000 MCG tablet Take 1 tablet by mouth daily as needed.    ? ?No current facility-administered medications for this visit.  ? ?Facility-Administered Medications Ordered in Other Visits  ?Medication Dose Route Frequency Provider Last Rate Last Admin  ? 0.9 %  sodium chloride infusion  250 mL Intravenous Once Curt Bears, MD      ? ? ?SURGICAL HISTORY:  ?Past Surgical History:  ?Procedure Laterality Date  ? COLONOSCOPY    ? POLYPECTOMY    ? PROSTATECTOMY    ? TONSILLECTOMY    ? ? ?REVIEW OF SYSTEMS:  A comprehensive review of systems was negative except for: Constitutional: positive for  fatigue ?Gastrointestinal: positive for abdominal pain, constipation, and diarrhea  ? ?PHYSICAL EXAMINATION: General appearance: alert, cooperative, appears stated age, fatigued, and no distress ?Head: Normocephalic, without obvious abnormality, atraumatic ?Neck: no adenopathy, no JVD, supple, symmetrical, trachea midline, and thyroid not enlarged, symmetric, no tenderness/mass/nodules ?Lymph nodes: Cervical, supraclavicular, and axillary nodes normal. ?Resp: clear to auscultation bilaterally ?Back: symmetric, no curvature. ROM normal. No CVA tenderness. ?Cardio: regular rate and rhythm, S1, S2 normal, no murmur, click, rub or gallop ?GI: soft, non-tender; bowel sounds normal; no masses,  no organomegaly ?Extremities: extremities normal, atraumatic, no cyanosis or edema ? ?ECOG PERFORMANCE STATUS: 1 - Symptomatic but completely ambulatory ? ?Blood pressure 136/77, pulse 73, temperature (!) 97.5 ?F (36.4 ?C), temperature source Tympanic, resp. rate 16, weight 165 lb 7 oz (75 kg), SpO2 97 %. ? ?LABORATORY DATA:  ?Lab Results  ?Component Value Date  ? WBC 13.4 (H) 08/10/2021  ? HGB 10.1 (L) 08/10/2021  ? HCT 32.4 (L) 08/10/2021  ? MCV 85.3 08/10/2021  ? PLT 257 08/10/2021  ? ? ?  Chemistry   ?   ?Component Value Date/Time  ? NA 141 08/10/2021 1113  ? NA 141 12/08/2016 0928  ? K 4.0 08/10/2021 1113  ? K 4.5 12/08/2016 0928  ? CL 110 08/10/2021 1113  ? CO2 28 08/10/2021 1113  ? CO2 27 12/08/2016 0928  ? BUN 21 08/10/2021 1113  ? BUN 14.1 12/08/2016 0928  ? CREATININE 1.17 08/10/2021 1113  ? CREATININE 0.9 12/08/2016 0928  ?    ?Component Value Date/Time  ? CALCIUM 9.3 08/10/2021 1113  ? CALCIUM 9.0 12/08/2016 0928  ? ALKPHOS 54 08/10/2021 1113  ? ALKPHOS 87 12/08/2016 0928  ? AST 19 08/10/2021 1113  ? AST 25 12/08/2016 0928  ? ALT 19 08/10/2021 1113  ? ALT 41 12/08/2016 0928  ? BILITOT 0.9 08/10/2021 1113  ? BILITOT 0.84 12/08/2016 0928  ?  ? ? ?RADIOGRAPHIC STUDIES: ? ?ASSESSMENT AND PLAN:  ?This is a very pleasant 76  years old white male with essential thrombocythemia with positive JAK-2 mutation as well as myelodysplastic syndrome. ?He is currently on treatment with Revlimid 5 mg by mouth daily according to the recommendation from [facility].  He is also on treatment with Aranesp every 3 weeks.  Aranesp was a change it to Procrit initially every 2 weeks and currently on weekly basis.  He mentioned that he is feeling much better on Procrit weekly  ?The patient has been on treatment with Retacrit 40,000 unit weekly for the last 3 years.  He continues to tolerate this treatment well. ?The patient has no complaints today except for the mild fatigue and abdominal distention. ?I recommended for him to continue his current treatment with Retacrit 40,000 unit on weekly basis based on his hematocrit level. ?I will see him back for follow-up  visit in 3 months for evaluation and repeat blood work. ?He was advised to call immediately if he has any other concerning symptoms in the interval. ? ?All questions were answered. The patient knows to call the clinic with any problems, questions or concerns. We can certainly see the patient much sooner if necessary. ? ? ?Disclaimer: This note was dictated with voice recognition software. Similar sounding words can inadvertently be transcribed and may not be corrected upon review. ?  ?

## 2021-08-23 ENCOUNTER — Other Ambulatory Visit: Payer: Self-pay

## 2021-08-23 ENCOUNTER — Inpatient Hospital Stay: Payer: Medicare Other

## 2021-08-23 VITALS — BP 135/75 | HR 70 | Resp 18

## 2021-08-23 DIAGNOSIS — D461 Refractory anemia with ring sideroblasts: Secondary | ICD-10-CM | POA: Diagnosis not present

## 2021-08-23 DIAGNOSIS — D469 Myelodysplastic syndrome, unspecified: Secondary | ICD-10-CM

## 2021-08-23 LAB — CBC WITH DIFFERENTIAL (CANCER CENTER ONLY)
Abs Immature Granulocytes: 0.36 10*3/uL — ABNORMAL HIGH (ref 0.00–0.07)
Basophils Absolute: 0.2 10*3/uL — ABNORMAL HIGH (ref 0.0–0.1)
Basophils Relative: 2 %
Eosinophils Absolute: 0.3 10*3/uL (ref 0.0–0.5)
Eosinophils Relative: 3 %
HCT: 32.2 % — ABNORMAL LOW (ref 39.0–52.0)
Hemoglobin: 10.1 g/dL — ABNORMAL LOW (ref 13.0–17.0)
Immature Granulocytes: 4 %
Lymphocytes Relative: 18 %
Lymphs Abs: 1.6 10*3/uL (ref 0.7–4.0)
MCH: 26.6 pg (ref 26.0–34.0)
MCHC: 31.4 g/dL (ref 30.0–36.0)
MCV: 85 fL (ref 80.0–100.0)
Monocytes Absolute: 0.7 10*3/uL (ref 0.1–1.0)
Monocytes Relative: 8 %
Neutro Abs: 6 10*3/uL (ref 1.7–7.7)
Neutrophils Relative %: 65 %
Platelet Count: 230 10*3/uL (ref 150–400)
RBC: 3.79 MIL/uL — ABNORMAL LOW (ref 4.22–5.81)
RDW: 33.5 % — ABNORMAL HIGH (ref 11.5–15.5)
WBC Count: 9.3 10*3/uL (ref 4.0–10.5)
nRBC: 0.4 % — ABNORMAL HIGH (ref 0.0–0.2)

## 2021-08-23 MED ORDER — EPOETIN ALFA-EPBX 40000 UNIT/ML IJ SOLN
40000.0000 [IU] | Freq: Once | INTRAMUSCULAR | Status: AC
  Administered 2021-08-23: 40000 [IU] via SUBCUTANEOUS
  Filled 2021-08-23: qty 1

## 2021-08-23 NOTE — Patient Instructions (Signed)
Epoetin Alfa injection ?What is this medication? ?EPOETIN ALFA (e POE e tin AL fa) helps your body make more red blood cells. This medicine is used to treat anemia caused by chronic kidney disease, cancer chemotherapy, or HIV-therapy. It may also be used before surgery if you have anemia. ?This medicine may be used for other purposes; ask your health care provider or pharmacist if you have questions. ?COMMON BRAND NAME(S): Epogen, Procrit, Retacrit ?What should I tell my care team before I take this medication? ?They need to know if you have any of these conditions: ?cancer ?heart disease ?high blood pressure ?history of blood clots ?history of stroke ?low levels of folate, iron, or vitamin B12 in the blood ?seizures ?an unusual or allergic reaction to erythropoietin, albumin, benzyl alcohol, hamster proteins, other medicines, foods, dyes, or preservatives ?pregnant or trying to get pregnant ?breast-feeding ?How should I use this medication? ?This medicine is for injection into a vein or under the skin. It is usually given by a health care professional in a hospital or clinic setting. ?If you get this medicine at home, you will be taught how to prepare and give this medicine. Use exactly as directed. Take your medicine at regular intervals. Do not take your medicine more often than directed. ?It is important that you put your used needles and syringes in a special sharps container. Do not put them in a trash can. If you do not have a sharps container, call your pharmacist or healthcare provider to get one. ?A special MedGuide will be given to you by the pharmacist with each prescription and refill. Be sure to read this information carefully each time. ?Talk to your pediatrician regarding the use of this medicine in children. While this drug may be prescribed for selected conditions, precautions do apply. ?Overdosage: If you think you have taken too much of this medicine contact a poison control center or emergency  room at once. ?NOTE: This medicine is only for you. Do not share this medicine with others. ?What if I miss a dose? ?If you miss a dose, take it as soon as you can. If it is almost time for your next dose, take only that dose. Do not take double or extra doses. ?What may interact with this medication? ?Interactions have not been studied. ?This list may not describe all possible interactions. Give your health care provider a list of all the medicines, herbs, non-prescription drugs, or dietary supplements you use. Also tell them if you smoke, drink alcohol, or use illegal drugs. Some items may interact with your medicine. ?What should I watch for while using this medication? ?Your condition will be monitored carefully while you are receiving this medicine. ?You may need blood work done while you are taking this medicine. ?This medicine may cause a decrease in vitamin B6. You should make sure that you get enough vitamin B6 while you are taking this medicine. Discuss the foods you eat and the vitamins you take with your health care professional. ?What side effects may I notice from receiving this medication? ?Side effects that you should report to your doctor or health care professional as soon as possible: ?allergic reactions like skin rash, itching or hives, swelling of the face, lips, or tongue ?seizures ?signs and symptoms of a blood clot such as breathing problems; changes in vision; chest pain; severe, sudden headache; pain, swelling, warmth in the leg; trouble speaking; sudden numbness or weakness of the face, arm or leg ?signs and symptoms of a stroke like   changes in vision; confusion; trouble speaking or understanding; severe headaches; sudden numbness or weakness of the face, arm or leg; trouble walking; dizziness; loss of balance or coordination ?Side effects that usually do not require medical attention (report to your doctor or health care professional if they continue or are  bothersome): ?chills ?cough ?dizziness ?fever ?headaches ?joint pain ?muscle cramps ?muscle pain ?nausea, vomiting ?pain, redness, or irritation at site where injected ?This list may not describe all possible side effects. Call your doctor for medical advice about side effects. You may report side effects to FDA at 1-800-FDA-1088. ?Where should I keep my medication? ?Keep out of the reach of children. ?Store in a refrigerator between 2 and 8 degrees C (36 and 46 degrees F). Do not freeze or shake. Throw away any unused portion if using a single-dose vial. Multi-dose vials can be kept in the refrigerator for up to 21 days after the initial dose. Throw away unused medicine. ?NOTE: This sheet is a summary. It may not cover all possible information. If you have questions about this medicine, talk to your doctor, pharmacist, or health care provider. ?? 2023 Elsevier/Gold Standard (2016-11-29 00:00:00) ? ?

## 2021-08-27 ENCOUNTER — Encounter: Payer: Self-pay | Admitting: Internal Medicine

## 2021-08-30 ENCOUNTER — Other Ambulatory Visit: Payer: Self-pay

## 2021-08-30 ENCOUNTER — Inpatient Hospital Stay: Payer: Medicare Other

## 2021-08-30 VITALS — BP 154/74 | HR 65 | Temp 97.8°F | Resp 18

## 2021-08-30 DIAGNOSIS — D469 Myelodysplastic syndrome, unspecified: Secondary | ICD-10-CM

## 2021-08-30 DIAGNOSIS — D461 Refractory anemia with ring sideroblasts: Secondary | ICD-10-CM | POA: Diagnosis not present

## 2021-08-30 LAB — CBC WITH DIFFERENTIAL (CANCER CENTER ONLY)
Abs Immature Granulocytes: 1.08 10*3/uL — ABNORMAL HIGH (ref 0.00–0.07)
Basophils Absolute: 0.3 10*3/uL — ABNORMAL HIGH (ref 0.0–0.1)
Basophils Relative: 2 %
Eosinophils Absolute: 0.7 10*3/uL — ABNORMAL HIGH (ref 0.0–0.5)
Eosinophils Relative: 4 %
HCT: 33.7 % — ABNORMAL LOW (ref 39.0–52.0)
Hemoglobin: 10.7 g/dL — ABNORMAL LOW (ref 13.0–17.0)
Immature Granulocytes: 6 %
Lymphocytes Relative: 14 %
Lymphs Abs: 2.4 10*3/uL (ref 0.7–4.0)
MCH: 27 pg (ref 26.0–34.0)
MCHC: 31.8 g/dL (ref 30.0–36.0)
MCV: 84.9 fL (ref 80.0–100.0)
Monocytes Absolute: 0.8 10*3/uL (ref 0.1–1.0)
Monocytes Relative: 5 %
Neutro Abs: 11.6 10*3/uL — ABNORMAL HIGH (ref 1.7–7.7)
Neutrophils Relative %: 69 %
Platelet Count: 347 10*3/uL (ref 150–400)
RBC: 3.97 MIL/uL — ABNORMAL LOW (ref 4.22–5.81)
RDW: 33.5 % — ABNORMAL HIGH (ref 11.5–15.5)
WBC Count: 16.9 10*3/uL — ABNORMAL HIGH (ref 4.0–10.5)
nRBC: 0.2 % (ref 0.0–0.2)

## 2021-08-30 MED ORDER — EPOETIN ALFA-EPBX 40000 UNIT/ML IJ SOLN
40000.0000 [IU] | Freq: Once | INTRAMUSCULAR | Status: AC
  Administered 2021-08-30: 40000 [IU] via SUBCUTANEOUS
  Filled 2021-08-30: qty 1

## 2021-09-07 ENCOUNTER — Other Ambulatory Visit: Payer: Self-pay

## 2021-09-07 ENCOUNTER — Inpatient Hospital Stay: Payer: Medicare Other

## 2021-09-07 DIAGNOSIS — D461 Refractory anemia with ring sideroblasts: Secondary | ICD-10-CM | POA: Diagnosis not present

## 2021-09-07 DIAGNOSIS — D469 Myelodysplastic syndrome, unspecified: Secondary | ICD-10-CM

## 2021-09-07 LAB — CBC WITH DIFFERENTIAL (CANCER CENTER ONLY)
Abs Immature Granulocytes: 0.28 10*3/uL — ABNORMAL HIGH (ref 0.00–0.07)
Basophils Absolute: 0.2 10*3/uL — ABNORMAL HIGH (ref 0.0–0.1)
Basophils Relative: 1 %
Eosinophils Absolute: 1.4 10*3/uL — ABNORMAL HIGH (ref 0.0–0.5)
Eosinophils Relative: 10 %
HCT: 34.8 % — ABNORMAL LOW (ref 39.0–52.0)
Hemoglobin: 11.1 g/dL — ABNORMAL LOW (ref 13.0–17.0)
Immature Granulocytes: 2 %
Lymphocytes Relative: 18 %
Lymphs Abs: 2.5 10*3/uL (ref 0.7–4.0)
MCH: 26.6 pg (ref 26.0–34.0)
MCHC: 31.9 g/dL (ref 30.0–36.0)
MCV: 83.5 fL (ref 80.0–100.0)
Monocytes Absolute: 1.2 10*3/uL — ABNORMAL HIGH (ref 0.1–1.0)
Monocytes Relative: 9 %
Neutro Abs: 8.6 10*3/uL — ABNORMAL HIGH (ref 1.7–7.7)
Neutrophils Relative %: 60 %
Platelet Count: 219 10*3/uL (ref 150–400)
RBC: 4.17 MIL/uL — ABNORMAL LOW (ref 4.22–5.81)
RDW: 34.6 % — ABNORMAL HIGH (ref 11.5–15.5)
WBC Count: 14.2 10*3/uL — ABNORMAL HIGH (ref 4.0–10.5)
WBC Morphology: INCREASED
nRBC: 0 % (ref 0.0–0.2)

## 2021-09-07 NOTE — Progress Notes (Signed)
Parameters not met for retacrit injection today. Hgb 11.1 g/dL. Pt given copy of lab work.

## 2021-09-10 ENCOUNTER — Ambulatory Visit
Admission: RE | Admit: 2021-09-10 | Discharge: 2021-09-10 | Disposition: A | Discharge status: Home / Self Care | Payer: Medicare Other | Source: Ambulatory Visit | Attending: Internal Medicine | Admitting: Internal Medicine

## 2021-09-10 ENCOUNTER — Other Ambulatory Visit: Payer: Self-pay | Admitting: Internal Medicine

## 2021-09-10 DIAGNOSIS — R1032 Left lower quadrant pain: Secondary | ICD-10-CM

## 2021-09-10 MED ORDER — IOPAMIDOL (ISOVUE-300) INJECTION 61%
100.0000 mL | Freq: Once | INTRAVENOUS | Status: AC | PRN
  Administered 2021-09-10: 100 mL via INTRAVENOUS

## 2021-09-13 ENCOUNTER — Inpatient Hospital Stay: Payer: Medicare Other

## 2021-09-13 ENCOUNTER — Other Ambulatory Visit: Payer: Self-pay

## 2021-09-13 ENCOUNTER — Inpatient Hospital Stay: Payer: Medicare Other | Attending: Internal Medicine

## 2021-09-13 VITALS — BP 140/75 | HR 69 | Temp 98.0°F | Resp 18

## 2021-09-13 DIAGNOSIS — D473 Essential (hemorrhagic) thrombocythemia: Secondary | ICD-10-CM | POA: Insufficient documentation

## 2021-09-13 DIAGNOSIS — D72829 Elevated white blood cell count, unspecified: Secondary | ICD-10-CM | POA: Diagnosis not present

## 2021-09-13 DIAGNOSIS — Z79899 Other long term (current) drug therapy: Secondary | ICD-10-CM | POA: Diagnosis not present

## 2021-09-13 DIAGNOSIS — D461 Refractory anemia with ring sideroblasts: Secondary | ICD-10-CM | POA: Insufficient documentation

## 2021-09-13 DIAGNOSIS — D469 Myelodysplastic syndrome, unspecified: Secondary | ICD-10-CM

## 2021-09-13 LAB — CBC WITH DIFFERENTIAL (CANCER CENTER ONLY)
Abs Immature Granulocytes: 0.06 10*3/uL (ref 0.00–0.07)
Basophils Absolute: 0.2 10*3/uL — ABNORMAL HIGH (ref 0.0–0.1)
Basophils Relative: 3 %
Eosinophils Absolute: 1 10*3/uL — ABNORMAL HIGH (ref 0.0–0.5)
Eosinophils Relative: 11 %
HCT: 31.4 % — ABNORMAL LOW (ref 39.0–52.0)
Hemoglobin: 10.1 g/dL — ABNORMAL LOW (ref 13.0–17.0)
Immature Granulocytes: 1 %
Lymphocytes Relative: 21 %
Lymphs Abs: 1.9 10*3/uL (ref 0.7–4.0)
MCH: 27 pg (ref 26.0–34.0)
MCHC: 32.2 g/dL (ref 30.0–36.0)
MCV: 84 fL (ref 80.0–100.0)
Monocytes Absolute: 0.8 10*3/uL (ref 0.1–1.0)
Monocytes Relative: 9 %
Neutro Abs: 5.1 10*3/uL (ref 1.7–7.7)
Neutrophils Relative %: 55 %
Platelet Count: 245 10*3/uL (ref 150–400)
RBC: 3.74 MIL/uL — ABNORMAL LOW (ref 4.22–5.81)
RDW: 32.9 % — ABNORMAL HIGH (ref 11.5–15.5)
WBC Count: 9.1 10*3/uL (ref 4.0–10.5)
nRBC: 0 % (ref 0.0–0.2)

## 2021-09-13 MED ORDER — EPOETIN ALFA-EPBX 40000 UNIT/ML IJ SOLN
40000.0000 [IU] | Freq: Once | INTRAMUSCULAR | Status: AC
  Administered 2021-09-13: 40000 [IU] via SUBCUTANEOUS

## 2021-09-20 ENCOUNTER — Inpatient Hospital Stay: Payer: Medicare Other

## 2021-09-21 ENCOUNTER — Other Ambulatory Visit: Payer: Self-pay

## 2021-09-21 ENCOUNTER — Inpatient Hospital Stay: Payer: Medicare Other

## 2021-09-21 VITALS — BP 133/72 | HR 79 | Temp 98.0°F | Resp 18 | Ht 66.0 in | Wt 163.8 lb

## 2021-09-21 DIAGNOSIS — D469 Myelodysplastic syndrome, unspecified: Secondary | ICD-10-CM

## 2021-09-21 DIAGNOSIS — D473 Essential (hemorrhagic) thrombocythemia: Secondary | ICD-10-CM | POA: Diagnosis not present

## 2021-09-21 LAB — CBC WITH DIFFERENTIAL (CANCER CENTER ONLY)
Abs Immature Granulocytes: 1.07 10*3/uL — ABNORMAL HIGH (ref 0.00–0.07)
Basophils Absolute: 0.4 10*3/uL — ABNORMAL HIGH (ref 0.0–0.1)
Basophils Relative: 2 %
Eosinophils Absolute: 0.4 10*3/uL (ref 0.0–0.5)
Eosinophils Relative: 3 %
HCT: 28.4 % — ABNORMAL LOW (ref 39.0–52.0)
Hemoglobin: 9.1 g/dL — ABNORMAL LOW (ref 13.0–17.0)
Immature Granulocytes: 7 %
Lymphocytes Relative: 15 %
Lymphs Abs: 2.4 10*3/uL (ref 0.7–4.0)
MCH: 26.9 pg (ref 26.0–34.0)
MCHC: 32 g/dL (ref 30.0–36.0)
MCV: 84 fL (ref 80.0–100.0)
Monocytes Absolute: 1.3 10*3/uL — ABNORMAL HIGH (ref 0.1–1.0)
Monocytes Relative: 8 %
Neutro Abs: 10.5 10*3/uL — ABNORMAL HIGH (ref 1.7–7.7)
Neutrophils Relative %: 65 %
Platelet Count: 294 10*3/uL (ref 150–400)
RBC: 3.38 MIL/uL — ABNORMAL LOW (ref 4.22–5.81)
RDW: 33.7 % — ABNORMAL HIGH (ref 11.5–15.5)
WBC Count: 16 10*3/uL — ABNORMAL HIGH (ref 4.0–10.5)
nRBC: 0.3 % — ABNORMAL HIGH (ref 0.0–0.2)

## 2021-09-21 MED ORDER — EPOETIN ALFA-EPBX 40000 UNIT/ML IJ SOLN
40000.0000 [IU] | Freq: Once | INTRAMUSCULAR | Status: AC
  Administered 2021-09-21: 40000 [IU] via SUBCUTANEOUS
  Filled 2021-09-21: qty 1

## 2021-09-23 ENCOUNTER — Encounter: Payer: Self-pay | Admitting: Gastroenterology

## 2021-09-23 ENCOUNTER — Ambulatory Visit: Payer: Medicare Other | Admitting: Gastroenterology

## 2021-09-23 ENCOUNTER — Other Ambulatory Visit: Payer: Medicare Other

## 2021-09-23 VITALS — BP 126/66 | HR 88 | Ht 66.0 in | Wt 161.4 lb

## 2021-09-23 DIAGNOSIS — A049 Bacterial intestinal infection, unspecified: Secondary | ICD-10-CM | POA: Diagnosis not present

## 2021-09-23 DIAGNOSIS — R197 Diarrhea, unspecified: Secondary | ICD-10-CM | POA: Diagnosis not present

## 2021-09-23 NOTE — Progress Notes (Signed)
09/23/2021 Vena Austria [id number] 05-09-1945   HISTORY OF PRESENT ILLNESS: This is a 76 year old male who is a patient of Dr. Blanch Media.  He chronically has some loose stools that he thinks is due to his medications, but about 3 to 4 weeks ago he had a worsening of this with diarrhea just like liquid/water several times a day, even at nighttime with episodes of urgency and incontinence.  C. difficile is negative his PCPs office.  He is taking Lomotil for about the past week and has noticed some improvement over the past couple of days.  CT scan of the abdomen and pelvis with contrast was unremarkable for cause of his symptoms.  He denies black or bloody stools.  He does admit to some upper abdominal discomfort and when you press on that area he gets a wave of nausea that kind of comes up.  He has pantoprazole on his medication list, but says that he only takes it as needed and has really not been using it recently.  He has a history of what sounds like IBS dating back to his 30s and 73s.  Colonoscopy in 2013 showed only diverticulosis.  Looks like he reported some nausea to Dr. Henrene Pastor back in November 2022 when he was seen here at that point.  Thought may be related to his untreated GERD.  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 His smoking use included cigarettes. He has never used smokeless tobacco. He reports that he does not currently use alcohol  after a past usage of about 7.0 standard drinks of alcohol per week. He reports that he does not use drugs. family history includes Breast cancer in his mother; Colon polyps in his father; Heart disease in his father; Lung cancer in his father; Osteoarthritis in his mother; Prostate cancer in his father. No Known Allergies    Outpatient Encounter Medications as of 09/23/2021  Medication Sig   amLODipine (NORVASC) 5 MG tablet Take 5 mg by mouth daily.   anagrelide (AGRYLIN) 1 MG capsule TAKE 1 CAPSULE(1 MG) BY MOUTH DAILY   cetirizine (ZYRTEC) 10 MG tablet TAKE 1 TABLET(10 MG) BY MOUTH DAILY   diphenoxylate-atropine (LOMOTIL) 2.5-0.025 MG tablet Take 1 tablet by mouth 4 (four) times daily as needed.   epoetin alfa-epbx (RETACRIT) 06237 UNIT/ML injection Inject into the skin.   gabapentin (NEURONTIN) 100 MG capsule TAKE 1 CAPSULE BY MOUTH TWICE DAILY AND 2 AT NIGHT   gabapentin (NEURONTIN) 100 MG capsule Take by mouth. Take 1 capsule as needed for restless legs.   hyoscyamine (LEVSIN SL) 0.125 MG SL tablet Place 1 tablet (0.125 mg total) under the tongue every 4 (four) hours as needed.   lenalidomide (REVLIMID) 5 MG capsule Take 5 mg by mouth daily.   Multiple Vitamin (MULTI-VITAMINS)  TABS Take 1 tablet by mouth daily.   ondansetron (ZOFRAN) 8 MG tablet TAKE 1 TABLET(8 MG) BY MOUTH EVERY 8 HOURS AS NEEDED FOR NAUSEA OR VOMITING   pantoprazole (PROTONIX) 40 MG tablet Take 1 tablet (40 mg total) by mouth daily.   Probiotic Product (ALIGN) 4 MG CAPS Take 1 capsule by mouth daily.   vitamin B-12 (CYANOCOBALAMIN) 1000 MCG tablet Take 1 tablet by mouth daily as needed.   [DISCONTINUED] loperamide (IMODIUM) 2 MG capsule 1 tablet. (Patient not taking: Reported on 09/23/2021)   Facility-Administered Encounter Medications as of 09/23/2021  Medication   0.9 %  sodium chloride infusion     REVIEW OF SYSTEMS  : All other systems reviewed and negative except where noted in the History of Present  Illness.   PHYSICAL EXAM: BP 126/66   Pulse 88   Ht '5\' 6"'$  (1.676 m)   Wt 161 lb 6 oz (73.2 kg)   BMI 26.05 kg/m  General: Well developed white male in no acute distress Head: Normocephalic and atraumatic Eyes:  Sclerae anicteric, conjunctiva pink. Ears: Normal auditory acuity Lungs: Clear throughout to auscultation; no W/R/R. Heart: Regular rate and rhythm; no M/R/G. Abdomen: Soft, non-distended.  BS present.  Non-tender. Rectal:  Will be done at the time of colonoscopy. Musculoskeletal: Symmetrical with no gross deformities  Skin: No lesions on visible extremities Extremities: No edema  Neurological: Alert oriented x 4, grossly non-focal Psychological:  Alert and cooperative. Normal mood and affect  ASSESSMENT AND PLAN: *Diarrhea: Chronically has some loose stools that he thinks is due to his medications, but about 3 to 4 weeks ago he had a worsening of this with diarrhea just like liquid/water several times a day, even at nighttime with episodes of urgency and incontinence.  C. difficile is negative his PCPs office.  He is taking Lomotil for about the past week and has noticed some improvement over the past couple of days.  He does have MDS and is on some treatment for that and his somewhat immunosuppressed state could have some other infectious source that is lingering for him.  We will check a stool GI pathogen panel.  Before having to proceed with any colonoscopy or invasive procedure he would want to discuss with his oncologist.  He has an appointment there on Monday and will discuss with them in case the need for that would arise.  Otherwise CT scan is also negative.  He will continue his Lomotil for now and if he continues to do well then can titrate the dosing down over time.  We will need to consider colonoscopy and possibly endoscopy as well (also has some upper abdominal discomfort and nausea that has come on with the diarrhea) if symptoms persist.   CC:  [name],  MD

## 2021-09-23 NOTE — Patient Instructions (Signed)
Your provider has requested that you go to the basement level for lab work before leaving today. Press "B" on the elevator. The lab is located at the first door on the left as you exit the elevator.  Continue Lomotil as discussed.  Discuss with hematologist about scheduling a EGD/Colon.  If you are age 76 or older, your body mass index should be between 23-30. Your Body mass index is 26.05 kg/m. If this is out of the aforementioned range listed, please consider follow up with your Primary Care Provider.  If you are age 59 or younger, your body mass index should be between 19-25. Your Body mass index is 26.05 kg/m. If this is out of the aformentioned range listed, please consider follow up with your Primary Care Provider.   ________________________________________________________  The [facility] providers would like to encourage you to use [facility] to communicate with providers for non-urgent requests or questions.  Due to long hold times on the telephone, sending your provider a message by [facility] may be a faster and more efficient way to get a response.  Please allow 48 business hours for a response.  Please remember that this is for non-urgent requests.  _______________________________________________________

## 2021-09-24 ENCOUNTER — Other Ambulatory Visit: Payer: Medicare Other

## 2021-09-24 DIAGNOSIS — R197 Diarrhea, unspecified: Secondary | ICD-10-CM

## 2021-09-24 DIAGNOSIS — A049 Bacterial intestinal infection, unspecified: Secondary | ICD-10-CM

## 2021-09-24 NOTE — Progress Notes (Signed)
Assessment and plan noted ?

## 2021-09-26 LAB — GI PROFILE, STOOL, PCR
Adenovirus F 40/41: NOT DETECTED
Astrovirus: NOT DETECTED
C difficile toxin A/B: DETECTED — AB
Campylobacter: NOT DETECTED
Cryptosporidium: NOT DETECTED
Cyclospora cayetanensis: NOT DETECTED
Entamoeba histolytica: NOT DETECTED
Enteroaggregative E coli: NOT DETECTED
Enteropathogenic E coli: NOT DETECTED
Enterotoxigenic E coli: NOT DETECTED
Giardia lamblia: NOT DETECTED
Norovirus GI/GII: DETECTED — AB
Plesiomonas shigelloides: NOT DETECTED
Rotavirus A: NOT DETECTED
Salmonella: NOT DETECTED
Sapovirus: NOT DETECTED
Shiga-toxin-producing E coli: NOT DETECTED
Shigella/Enteroinvasive E coli: NOT DETECTED
Vibrio cholerae: NOT DETECTED
Vibrio: NOT DETECTED
Yersinia enterocolitica: NOT DETECTED

## 2021-09-27 ENCOUNTER — Ambulatory Visit: Payer: Medicare Other

## 2021-09-27 ENCOUNTER — Other Ambulatory Visit: Payer: Medicare Other

## 2021-09-28 ENCOUNTER — Other Ambulatory Visit: Payer: Self-pay

## 2021-09-28 ENCOUNTER — Telehealth: Payer: Self-pay | Admitting: Gastroenterology

## 2021-09-28 ENCOUNTER — Inpatient Hospital Stay: Payer: Medicare Other

## 2021-09-28 VITALS — BP 142/98 | HR 75 | Temp 98.3°F | Resp 18

## 2021-09-28 DIAGNOSIS — D473 Essential (hemorrhagic) thrombocythemia: Secondary | ICD-10-CM | POA: Diagnosis not present

## 2021-09-28 DIAGNOSIS — D469 Myelodysplastic syndrome, unspecified: Secondary | ICD-10-CM

## 2021-09-28 LAB — CBC WITH DIFFERENTIAL (CANCER CENTER ONLY)
Abs Immature Granulocytes: 1.9 K/uL — ABNORMAL HIGH (ref 0.00–0.07)
Basophils Absolute: 0.3 K/uL — ABNORMAL HIGH (ref 0.0–0.1)
Basophils Relative: 2 %
Eosinophils Absolute: 0.8 K/uL — ABNORMAL HIGH (ref 0.0–0.5)
Eosinophils Relative: 5 %
HCT: 30.4 % — ABNORMAL LOW (ref 39.0–52.0)
Hemoglobin: 9.6 g/dL — ABNORMAL LOW (ref 13.0–17.0)
Immature Granulocytes: 10 %
Lymphocytes Relative: 12 %
Lymphs Abs: 2.1 K/uL (ref 0.7–4.0)
MCH: 26.3 pg (ref 26.0–34.0)
MCHC: 31.6 g/dL (ref 30.0–36.0)
MCV: 83.3 fL (ref 80.0–100.0)
Monocytes Absolute: 0.8 K/uL (ref 0.1–1.0)
Monocytes Relative: 4 %
Neutro Abs: 12.6 K/uL — ABNORMAL HIGH (ref 1.7–7.7)
Neutrophils Relative %: 67 %
Platelet Count: 286 K/uL (ref 150–400)
RBC: 3.65 MIL/uL — ABNORMAL LOW (ref 4.22–5.81)
RDW: 33.3 % — ABNORMAL HIGH (ref 11.5–15.5)
WBC Count: 18.5 K/uL — ABNORMAL HIGH (ref 4.0–10.5)
nRBC: 0.3 % — ABNORMAL HIGH (ref 0.0–0.2)

## 2021-09-28 MED ORDER — EPOETIN ALFA-EPBX 40000 UNIT/ML IJ SOLN
40000.0000 [IU] | Freq: Once | INTRAMUSCULAR | Status: AC
  Administered 2021-09-28: 40000 [IU] via SUBCUTANEOUS
  Filled 2021-09-28: qty 1

## 2021-09-28 NOTE — Telephone Encounter (Signed)
See alternate results note dated 6/20

## 2021-10-04 ENCOUNTER — Inpatient Hospital Stay: Payer: Medicare Other

## 2021-10-04 ENCOUNTER — Other Ambulatory Visit: Payer: Self-pay

## 2021-10-04 VITALS — BP 128/70 | HR 77 | Resp 16

## 2021-10-04 DIAGNOSIS — D473 Essential (hemorrhagic) thrombocythemia: Secondary | ICD-10-CM | POA: Diagnosis not present

## 2021-10-04 DIAGNOSIS — D469 Myelodysplastic syndrome, unspecified: Secondary | ICD-10-CM

## 2021-10-04 LAB — CBC WITH DIFFERENTIAL (CANCER CENTER ONLY)
Abs Immature Granulocytes: 0.74 10*3/uL — ABNORMAL HIGH (ref 0.00–0.07)
Basophils Absolute: 0.3 10*3/uL — ABNORMAL HIGH (ref 0.0–0.1)
Basophils Relative: 2 %
Eosinophils Absolute: 0.7 10*3/uL — ABNORMAL HIGH (ref 0.0–0.5)
Eosinophils Relative: 4 %
HCT: 31.6 % — ABNORMAL LOW (ref 39.0–52.0)
Hemoglobin: 10 g/dL — ABNORMAL LOW (ref 13.0–17.0)
Immature Granulocytes: 4 %
Lymphocytes Relative: 12 %
Lymphs Abs: 2.2 10*3/uL (ref 0.7–4.0)
MCH: 26.3 pg (ref 26.0–34.0)
MCHC: 31.6 g/dL (ref 30.0–36.0)
MCV: 83.2 fL (ref 80.0–100.0)
Monocytes Absolute: 1.3 10*3/uL — ABNORMAL HIGH (ref 0.1–1.0)
Monocytes Relative: 7 %
Neutro Abs: 12.3 10*3/uL — ABNORMAL HIGH (ref 1.7–7.7)
Neutrophils Relative %: 71 %
Platelet Count: 305 10*3/uL (ref 150–400)
RBC: 3.8 MIL/uL — ABNORMAL LOW (ref 4.22–5.81)
RDW: 32.5 % — ABNORMAL HIGH (ref 11.5–15.5)
WBC Count: 17.4 10*3/uL — ABNORMAL HIGH (ref 4.0–10.5)
nRBC: 0 % (ref 0.0–0.2)

## 2021-10-04 MED ORDER — EPOETIN ALFA-EPBX 40000 UNIT/ML IJ SOLN
40000.0000 [IU] | Freq: Once | INTRAMUSCULAR | Status: AC
  Administered 2021-10-04: 40000 [IU] via SUBCUTANEOUS
  Filled 2021-10-04: qty 1

## 2021-10-05 DIAGNOSIS — R197 Diarrhea, unspecified: Secondary | ICD-10-CM

## 2021-10-11 ENCOUNTER — Other Ambulatory Visit: Payer: Self-pay

## 2021-10-11 ENCOUNTER — Inpatient Hospital Stay: Payer: Medicare Other | Attending: Internal Medicine

## 2021-10-11 ENCOUNTER — Inpatient Hospital Stay: Payer: Medicare Other

## 2021-10-11 VITALS — BP 132/67 | HR 79 | Temp 98.1°F | Resp 18

## 2021-10-11 DIAGNOSIS — Z79899 Other long term (current) drug therapy: Secondary | ICD-10-CM | POA: Diagnosis not present

## 2021-10-11 DIAGNOSIS — D461 Refractory anemia with ring sideroblasts: Secondary | ICD-10-CM | POA: Diagnosis not present

## 2021-10-11 DIAGNOSIS — D469 Myelodysplastic syndrome, unspecified: Secondary | ICD-10-CM

## 2021-10-11 DIAGNOSIS — D473 Essential (hemorrhagic) thrombocythemia: Secondary | ICD-10-CM | POA: Insufficient documentation

## 2021-10-11 LAB — CBC WITH DIFFERENTIAL (CANCER CENTER ONLY)
Abs Immature Granulocytes: 0.31 10*3/uL — ABNORMAL HIGH (ref 0.00–0.07)
Basophils Absolute: 0.2 10*3/uL — ABNORMAL HIGH (ref 0.0–0.1)
Basophils Relative: 1 %
Eosinophils Absolute: 0.8 10*3/uL — ABNORMAL HIGH (ref 0.0–0.5)
Eosinophils Relative: 5 %
HCT: 30.9 % — ABNORMAL LOW (ref 39.0–52.0)
Hemoglobin: 9.8 g/dL — ABNORMAL LOW (ref 13.0–17.0)
Immature Granulocytes: 2 %
Lymphocytes Relative: 11 %
Lymphs Abs: 1.6 10*3/uL (ref 0.7–4.0)
MCH: 26.3 pg (ref 26.0–34.0)
MCHC: 31.7 g/dL (ref 30.0–36.0)
MCV: 82.8 fL (ref 80.0–100.0)
Monocytes Absolute: 0.8 10*3/uL (ref 0.1–1.0)
Monocytes Relative: 5 %
Neutro Abs: 11 10*3/uL — ABNORMAL HIGH (ref 1.7–7.7)
Neutrophils Relative %: 76 %
Platelet Count: 202 10*3/uL (ref 150–400)
RBC: 3.73 MIL/uL — ABNORMAL LOW (ref 4.22–5.81)
RDW: 33.1 % — ABNORMAL HIGH (ref 11.5–15.5)
WBC Count: 14.7 10*3/uL — ABNORMAL HIGH (ref 4.0–10.5)
nRBC: 0.1 % (ref 0.0–0.2)

## 2021-10-11 MED ORDER — EPOETIN ALFA-EPBX 40000 UNIT/ML IJ SOLN
40000.0000 [IU] | Freq: Once | INTRAMUSCULAR | Status: AC
  Administered 2021-10-11: 40000 [IU] via SUBCUTANEOUS
  Filled 2021-10-11: qty 1

## 2021-10-13 NOTE — Addendum Note (Signed)
Addended by: Timothy Lasso on: 10/13/2021 12:46 PM   Modules accepted: Orders

## 2021-10-14 ENCOUNTER — Other Ambulatory Visit: Payer: Medicare Other

## 2021-10-14 DIAGNOSIS — R197 Diarrhea, unspecified: Secondary | ICD-10-CM

## 2021-10-16 LAB — CLOSTRIDIUM DIFFICILE BY PCR: Toxigenic C. Difficile by PCR: NEGATIVE

## 2021-10-18 ENCOUNTER — Other Ambulatory Visit: Payer: Self-pay

## 2021-10-18 ENCOUNTER — Inpatient Hospital Stay: Payer: Medicare Other

## 2021-10-18 VITALS — BP 148/80 | HR 69 | Temp 97.7°F | Resp 18

## 2021-10-18 DIAGNOSIS — D469 Myelodysplastic syndrome, unspecified: Secondary | ICD-10-CM

## 2021-10-18 DIAGNOSIS — D461 Refractory anemia with ring sideroblasts: Secondary | ICD-10-CM | POA: Diagnosis not present

## 2021-10-18 LAB — CBC WITH DIFFERENTIAL (CANCER CENTER ONLY)
Abs Immature Granulocytes: 0.77 10*3/uL — ABNORMAL HIGH (ref 0.00–0.07)
Basophils Absolute: 0.2 10*3/uL — ABNORMAL HIGH (ref 0.0–0.1)
Basophils Relative: 1 %
Eosinophils Absolute: 0.7 10*3/uL — ABNORMAL HIGH (ref 0.0–0.5)
Eosinophils Relative: 4 %
HCT: 30.2 % — ABNORMAL LOW (ref 39.0–52.0)
Hemoglobin: 9.4 g/dL — ABNORMAL LOW (ref 13.0–17.0)
Immature Granulocytes: 5 %
Lymphocytes Relative: 10 %
Lymphs Abs: 1.7 10*3/uL (ref 0.7–4.0)
MCH: 26.1 pg (ref 26.0–34.0)
MCHC: 31.1 g/dL (ref 30.0–36.0)
MCV: 83.9 fL (ref 80.0–100.0)
Monocytes Absolute: 0.7 10*3/uL (ref 0.1–1.0)
Monocytes Relative: 5 %
Neutro Abs: 12.1 10*3/uL — ABNORMAL HIGH (ref 1.7–7.7)
Neutrophils Relative %: 75 %
Platelet Count: 281 10*3/uL (ref 150–400)
RBC: 3.6 MIL/uL — ABNORMAL LOW (ref 4.22–5.81)
RDW: 32.8 % — ABNORMAL HIGH (ref 11.5–15.5)
WBC Count: 16.1 10*3/uL — ABNORMAL HIGH (ref 4.0–10.5)
nRBC: 0.1 % (ref 0.0–0.2)

## 2021-10-18 MED ORDER — EPOETIN ALFA-EPBX 40000 UNIT/ML IJ SOLN
40000.0000 [IU] | Freq: Once | INTRAMUSCULAR | Status: AC
  Administered 2021-10-18: 40000 [IU] via SUBCUTANEOUS
  Filled 2021-10-18: qty 1

## 2021-10-19 ENCOUNTER — Other Ambulatory Visit: Payer: Self-pay

## 2021-10-19 MED ORDER — CHOLESTYRAMINE 4 G PO PACK: 4.0000 g | PACK | Freq: Every day | ORAL | 0 refills | Status: DC

## 2021-10-25 ENCOUNTER — Other Ambulatory Visit: Payer: Self-pay

## 2021-10-25 ENCOUNTER — Inpatient Hospital Stay: Payer: Medicare Other

## 2021-10-25 VITALS — BP 145/83 | HR 74 | Temp 98.2°F | Resp 18

## 2021-10-25 DIAGNOSIS — D469 Myelodysplastic syndrome, unspecified: Secondary | ICD-10-CM

## 2021-10-25 DIAGNOSIS — D461 Refractory anemia with ring sideroblasts: Secondary | ICD-10-CM | POA: Diagnosis not present

## 2021-10-25 LAB — CBC WITH DIFFERENTIAL (CANCER CENTER ONLY)
Abs Immature Granulocytes: 2.17 10*3/uL — ABNORMAL HIGH (ref 0.00–0.07)
Basophils Absolute: 0.2 10*3/uL — ABNORMAL HIGH (ref 0.0–0.1)
Basophils Relative: 1 %
Eosinophils Absolute: 0.9 10*3/uL — ABNORMAL HIGH (ref 0.0–0.5)
Eosinophils Relative: 5 %
HCT: 30.7 % — ABNORMAL LOW (ref 39.0–52.0)
Hemoglobin: 9.6 g/dL — ABNORMAL LOW (ref 13.0–17.0)
Immature Granulocytes: 10 %
Lymphocytes Relative: 9 %
Lymphs Abs: 2 10*3/uL (ref 0.7–4.0)
MCH: 26.2 pg (ref 26.0–34.0)
MCHC: 31.3 g/dL (ref 30.0–36.0)
MCV: 83.9 fL (ref 80.0–100.0)
Monocytes Absolute: 0.7 10*3/uL (ref 0.1–1.0)
Monocytes Relative: 3 %
Neutro Abs: 15.1 10*3/uL — ABNORMAL HIGH (ref 1.7–7.7)
Neutrophils Relative %: 72 %
Platelet Count: 348 10*3/uL (ref 150–400)
RBC: 3.66 MIL/uL — ABNORMAL LOW (ref 4.22–5.81)
RDW: 33.5 % — ABNORMAL HIGH (ref 11.5–15.5)
WBC Count: 21.1 10*3/uL — ABNORMAL HIGH (ref 4.0–10.5)
nRBC: 0.1 % (ref 0.0–0.2)

## 2021-10-25 MED ORDER — EPOETIN ALFA-EPBX 40000 UNIT/ML IJ SOLN
40000.0000 [IU] | Freq: Once | INTRAMUSCULAR | Status: AC
  Administered 2021-10-25: 40000 [IU] via SUBCUTANEOUS
  Filled 2021-10-25: qty 1

## 2021-10-25 NOTE — Patient Instructions (Signed)
Epoetin Alfa injection ?What is this medication? ?EPOETIN ALFA (e POE e tin AL fa) helps your body make more red blood cells. This medicine is used to treat anemia caused by chronic kidney disease, cancer chemotherapy, or HIV-therapy. It may also be used before surgery if you have anemia. ?This medicine may be used for other purposes; ask your health care provider or pharmacist if you have questions. ?COMMON BRAND NAME(S): Epogen, Procrit, Retacrit ?What should I tell my care team before I take this medication? ?They need to know if you have any of these conditions: ?cancer ?heart disease ?high blood pressure ?history of blood clots ?history of stroke ?low levels of folate, iron, or vitamin B12 in the blood ?seizures ?an unusual or allergic reaction to erythropoietin, albumin, benzyl alcohol, hamster proteins, other medicines, foods, dyes, or preservatives ?pregnant or trying to get pregnant ?breast-feeding ?How should I use this medication? ?This medicine is for injection into a vein or under the skin. It is usually given by a health care professional in a hospital or clinic setting. ?If you get this medicine at home, you will be taught how to prepare and give this medicine. Use exactly as directed. Take your medicine at regular intervals. Do not take your medicine more often than directed. ?It is important that you put your used needles and syringes in a special sharps container. Do not put them in a trash can. If you do not have a sharps container, call your pharmacist or healthcare provider to get one. ?A special MedGuide will be given to you by the pharmacist with each prescription and refill. Be sure to read this information carefully each time. ?Talk to your pediatrician regarding the use of this medicine in children. While this drug may be prescribed for selected conditions, precautions do apply. ?Overdosage: If you think you have taken too much of this medicine contact a poison control center or emergency  room at once. ?NOTE: This medicine is only for you. Do not share this medicine with others. ?What if I miss a dose? ?If you miss a dose, take it as soon as you can. If it is almost time for your next dose, take only that dose. Do not take double or extra doses. ?What may interact with this medication? ?Interactions have not been studied. ?This list may not describe all possible interactions. Give your health care provider a list of all the medicines, herbs, non-prescription drugs, or dietary supplements you use. Also tell them if you smoke, drink alcohol, or use illegal drugs. Some items may interact with your medicine. ?What should I watch for while using this medication? ?Your condition will be monitored carefully while you are receiving this medicine. ?You may need blood work done while you are taking this medicine. ?This medicine may cause a decrease in vitamin B6. You should make sure that you get enough vitamin B6 while you are taking this medicine. Discuss the foods you eat and the vitamins you take with your health care professional. ?What side effects may I notice from receiving this medication? ?Side effects that you should report to your doctor or health care professional as soon as possible: ?allergic reactions like skin rash, itching or hives, swelling of the face, lips, or tongue ?seizures ?signs and symptoms of a blood clot such as breathing problems; changes in vision; chest pain; severe, sudden headache; pain, swelling, warmth in the leg; trouble speaking; sudden numbness or weakness of the face, arm or leg ?signs and symptoms of a stroke like   changes in vision; confusion; trouble speaking or understanding; severe headaches; sudden numbness or weakness of the face, arm or leg; trouble walking; dizziness; loss of balance or coordination ?Side effects that usually do not require medical attention (report to your doctor or health care professional if they continue or are  bothersome): ?chills ?cough ?dizziness ?fever ?headaches ?joint pain ?muscle cramps ?muscle pain ?nausea, vomiting ?pain, redness, or irritation at site where injected ?This list may not describe all possible side effects. Call your doctor for medical advice about side effects. You may report side effects to FDA at 1-800-FDA-1088. ?Where should I keep my medication? ?Keep out of the reach of children. ?Store in a refrigerator between 2 and 8 degrees C (36 and 46 degrees F). Do not freeze or shake. Throw away any unused portion if using a single-dose vial. Multi-dose vials can be kept in the refrigerator for up to 21 days after the initial dose. Throw away unused medicine. ?NOTE: This sheet is a summary. It may not cover all possible information. If you have questions about this medicine, talk to your doctor, pharmacist, or health care provider. ?? 2023 Elsevier/Gold Standard (2016-11-29 00:00:00) ? ?

## 2021-11-01 ENCOUNTER — Inpatient Hospital Stay: Payer: Medicare Other

## 2021-11-01 ENCOUNTER — Other Ambulatory Visit: Payer: Self-pay | Admitting: Pharmacist

## 2021-11-01 ENCOUNTER — Other Ambulatory Visit: Payer: Self-pay

## 2021-11-01 VITALS — BP 142/73 | HR 72 | Temp 98.2°F | Resp 17

## 2021-11-01 DIAGNOSIS — D469 Myelodysplastic syndrome, unspecified: Secondary | ICD-10-CM

## 2021-11-01 DIAGNOSIS — D461 Refractory anemia with ring sideroblasts: Secondary | ICD-10-CM | POA: Diagnosis not present

## 2021-11-01 LAB — CBC WITH DIFFERENTIAL (CANCER CENTER ONLY)
Abs Immature Granulocytes: 0.79 10*3/uL — ABNORMAL HIGH (ref 0.00–0.07)
Basophils Absolute: 0.2 10*3/uL — ABNORMAL HIGH (ref 0.0–0.1)
Basophils Relative: 1 %
Eosinophils Absolute: 1 10*3/uL — ABNORMAL HIGH (ref 0.0–0.5)
Eosinophils Relative: 6 %
HCT: 30.3 % — ABNORMAL LOW (ref 39.0–52.0)
Hemoglobin: 9.6 g/dL — ABNORMAL LOW (ref 13.0–17.0)
Immature Granulocytes: 4 %
Lymphocytes Relative: 12 %
Lymphs Abs: 2.3 10*3/uL (ref 0.7–4.0)
MCH: 26.2 pg (ref 26.0–34.0)
MCHC: 31.7 g/dL (ref 30.0–36.0)
MCV: 82.6 fL (ref 80.0–100.0)
Monocytes Absolute: 1.4 10*3/uL — ABNORMAL HIGH (ref 0.1–1.0)
Monocytes Relative: 7 %
Neutro Abs: 13.3 10*3/uL — ABNORMAL HIGH (ref 1.7–7.7)
Neutrophils Relative %: 70 %
Platelet Count: 231 10*3/uL (ref 150–400)
RBC: 3.67 MIL/uL — ABNORMAL LOW (ref 4.22–5.81)
RDW: 33.8 % — ABNORMAL HIGH (ref 11.5–15.5)
WBC Count: 19 10*3/uL — ABNORMAL HIGH (ref 4.0–10.5)
nRBC: 0.1 % (ref 0.0–0.2)

## 2021-11-01 MED ORDER — EPOETIN ALFA-EPBX 40000 UNIT/ML IJ SOLN
40000.0000 [IU] | Freq: Once | INTRAMUSCULAR | Status: AC
  Administered 2021-11-01: 40000 [IU] via SUBCUTANEOUS
  Filled 2021-11-01: qty 1

## 2021-11-02 ENCOUNTER — Other Ambulatory Visit: Payer: Self-pay | Admitting: Medical Oncology

## 2021-11-02 ENCOUNTER — Telehealth: Payer: Self-pay | Admitting: Gastroenterology

## 2021-11-02 NOTE — Telephone Encounter (Signed)
Roy Johnson would you like to order stool studies ?

## 2021-11-02 NOTE — Telephone Encounter (Signed)
See my chart message

## 2021-11-02 NOTE — Addendum Note (Signed)
Addended by: Timothy Lasso on: 11/02/2021 01:57 PM   Modules accepted: Orders

## 2021-11-02 NOTE — Telephone Encounter (Signed)
Inbound call from patient stating that he believes he has C-diff again. Patient is requesting a call back to discuss. Please advise.

## 2021-11-04 ENCOUNTER — Other Ambulatory Visit: Payer: Medicare Other

## 2021-11-04 DIAGNOSIS — R197 Diarrhea, unspecified: Secondary | ICD-10-CM

## 2021-11-06 LAB — CLOSTRIDIUM DIFFICILE BY PCR: Toxigenic C. Difficile by PCR: NEGATIVE

## 2021-11-08 ENCOUNTER — Inpatient Hospital Stay: Payer: Medicare Other

## 2021-11-08 ENCOUNTER — Other Ambulatory Visit: Payer: Self-pay

## 2021-11-08 VITALS — BP 136/79 | HR 74 | Temp 98.0°F | Resp 18

## 2021-11-08 DIAGNOSIS — D469 Myelodysplastic syndrome, unspecified: Secondary | ICD-10-CM

## 2021-11-08 DIAGNOSIS — D461 Refractory anemia with ring sideroblasts: Secondary | ICD-10-CM | POA: Diagnosis not present

## 2021-11-08 LAB — CBC WITH DIFFERENTIAL (CANCER CENTER ONLY)
Abs Immature Granulocytes: 0.28 10*3/uL — ABNORMAL HIGH (ref 0.00–0.07)
Basophils Absolute: 0.2 10*3/uL — ABNORMAL HIGH (ref 0.0–0.1)
Basophils Relative: 2 %
Eosinophils Absolute: 0.6 10*3/uL — ABNORMAL HIGH (ref 0.0–0.5)
Eosinophils Relative: 5 %
HCT: 29 % — ABNORMAL LOW (ref 39.0–52.0)
Hemoglobin: 9.3 g/dL — ABNORMAL LOW (ref 13.0–17.0)
Immature Granulocytes: 2 %
Lymphocytes Relative: 17 %
Lymphs Abs: 2.2 10*3/uL (ref 0.7–4.0)
MCH: 26.3 pg (ref 26.0–34.0)
MCHC: 32.1 g/dL (ref 30.0–36.0)
MCV: 82.2 fL (ref 80.0–100.0)
Monocytes Absolute: 0.8 10*3/uL (ref 0.1–1.0)
Monocytes Relative: 6 %
Neutro Abs: 8.6 10*3/uL — ABNORMAL HIGH (ref 1.7–7.7)
Neutrophils Relative %: 68 %
Platelet Count: 310 10*3/uL (ref 150–400)
RBC: 3.53 MIL/uL — ABNORMAL LOW (ref 4.22–5.81)
RDW: 33.6 % — ABNORMAL HIGH (ref 11.5–15.5)
WBC Count: 12.7 10*3/uL — ABNORMAL HIGH (ref 4.0–10.5)
nRBC: 0.2 % (ref 0.0–0.2)

## 2021-11-08 MED ORDER — DARBEPOETIN ALFA 300 MCG/0.6ML IJ SOSY
300.0000 ug | PREFILLED_SYRINGE | Freq: Once | INTRAMUSCULAR | Status: AC
  Administered 2021-11-08: 300 ug via SUBCUTANEOUS
  Filled 2021-11-08: qty 0.6

## 2021-11-15 ENCOUNTER — Other Ambulatory Visit: Payer: Self-pay

## 2021-11-15 ENCOUNTER — Inpatient Hospital Stay: Payer: Medicare Other | Admitting: Internal Medicine

## 2021-11-15 ENCOUNTER — Inpatient Hospital Stay: Payer: Medicare Other | Attending: Internal Medicine

## 2021-11-15 ENCOUNTER — Encounter: Payer: Self-pay | Admitting: Internal Medicine

## 2021-11-15 ENCOUNTER — Inpatient Hospital Stay: Payer: Medicare Other

## 2021-11-15 VITALS — BP 136/69 | HR 77 | Temp 97.8°F | Resp 16 | Wt 156.0 lb

## 2021-11-15 DIAGNOSIS — D473 Essential (hemorrhagic) thrombocythemia: Secondary | ICD-10-CM | POA: Insufficient documentation

## 2021-11-15 DIAGNOSIS — D72829 Elevated white blood cell count, unspecified: Secondary | ICD-10-CM | POA: Insufficient documentation

## 2021-11-15 DIAGNOSIS — D469 Myelodysplastic syndrome, unspecified: Secondary | ICD-10-CM

## 2021-11-15 DIAGNOSIS — R197 Diarrhea, unspecified: Secondary | ICD-10-CM | POA: Diagnosis not present

## 2021-11-15 DIAGNOSIS — Z8546 Personal history of malignant neoplasm of prostate: Secondary | ICD-10-CM | POA: Diagnosis not present

## 2021-11-15 DIAGNOSIS — Z79899 Other long term (current) drug therapy: Secondary | ICD-10-CM | POA: Insufficient documentation

## 2021-11-15 DIAGNOSIS — Z9079 Acquired absence of other genital organ(s): Secondary | ICD-10-CM | POA: Insufficient documentation

## 2021-11-15 DIAGNOSIS — R109 Unspecified abdominal pain: Secondary | ICD-10-CM | POA: Diagnosis not present

## 2021-11-15 DIAGNOSIS — R140 Abdominal distension (gaseous): Secondary | ICD-10-CM | POA: Insufficient documentation

## 2021-11-15 DIAGNOSIS — R5383 Other fatigue: Secondary | ICD-10-CM | POA: Insufficient documentation

## 2021-11-15 DIAGNOSIS — R110 Nausea: Secondary | ICD-10-CM | POA: Diagnosis not present

## 2021-11-15 DIAGNOSIS — Z7961 Long term (current) use of immunomodulator: Secondary | ICD-10-CM | POA: Insufficient documentation

## 2021-11-15 DIAGNOSIS — K219 Gastro-esophageal reflux disease without esophagitis: Secondary | ICD-10-CM | POA: Insufficient documentation

## 2021-11-15 DIAGNOSIS — D461 Refractory anemia with ring sideroblasts: Secondary | ICD-10-CM | POA: Diagnosis present

## 2021-11-15 DIAGNOSIS — Z8719 Personal history of other diseases of the digestive system: Secondary | ICD-10-CM | POA: Diagnosis not present

## 2021-11-15 LAB — CBC WITH DIFFERENTIAL (CANCER CENTER ONLY)
Abs Immature Granulocytes: 1.46 10*3/uL — ABNORMAL HIGH (ref 0.00–0.07)
Basophils Absolute: 0.5 10*3/uL — ABNORMAL HIGH (ref 0.0–0.1)
Basophils Relative: 3 %
Eosinophils Absolute: 0.5 10*3/uL (ref 0.0–0.5)
Eosinophils Relative: 3 %
HCT: 32.2 % — ABNORMAL LOW (ref 39.0–52.0)
Hemoglobin: 10 g/dL — ABNORMAL LOW (ref 13.0–17.0)
Immature Granulocytes: 8 %
Lymphocytes Relative: 14 %
Lymphs Abs: 2.5 10*3/uL (ref 0.7–4.0)
MCH: 25.6 pg — ABNORMAL LOW (ref 26.0–34.0)
MCHC: 31.1 g/dL (ref 30.0–36.0)
MCV: 82.4 fL (ref 80.0–100.0)
Monocytes Absolute: 1.2 10*3/uL — ABNORMAL HIGH (ref 0.1–1.0)
Monocytes Relative: 7 %
Neutro Abs: 11.6 10*3/uL — ABNORMAL HIGH (ref 1.7–7.7)
Neutrophils Relative %: 65 %
Platelet Count: 326 10*3/uL (ref 150–400)
RBC: 3.91 MIL/uL — ABNORMAL LOW (ref 4.22–5.81)
RDW: 35.8 % — ABNORMAL HIGH (ref 11.5–15.5)
WBC Count: 17.7 10*3/uL — ABNORMAL HIGH (ref 4.0–10.5)
WBC Morphology: INCREASED
nRBC: 1.2 % — ABNORMAL HIGH (ref 0.0–0.2)

## 2021-11-15 LAB — CMP (CANCER CENTER ONLY)
ALT: 15 U/L (ref 0–44)
AST: 14 U/L — ABNORMAL LOW (ref 15–41)
Albumin: 4.6 g/dL (ref 3.5–5.0)
Alkaline Phosphatase: 58 U/L (ref 38–126)
Anion gap: 6 (ref 5–15)
BUN: 18 mg/dL (ref 8–23)
CO2: 26 mmol/L (ref 22–32)
Calcium: 9.4 mg/dL (ref 8.9–10.3)
Chloride: 110 mmol/L (ref 98–111)
Creatinine: 1.26 mg/dL — ABNORMAL HIGH (ref 0.61–1.24)
GFR, Estimated: 59 mL/min — ABNORMAL LOW (ref >60)
Glucose, Bld: 98 mg/dL (ref 70–99)
Potassium: 4.1 mmol/L (ref 3.5–5.1)
Sodium: 142 mmol/L (ref 135–145)
Total Bilirubin: 0.9 mg/dL (ref 0.3–1.2)
Total Protein: 7.5 g/dL (ref 6.5–8.1)

## 2021-11-15 MED ORDER — EPOETIN ALFA-EPBX 40000 UNIT/ML IJ SOLN
40000.0000 [IU] | Freq: Once | INTRAMUSCULAR | Status: AC
  Administered 2021-11-15: 40000 [IU] via SUBCUTANEOUS
  Filled 2021-11-15: qty 1

## 2021-11-15 NOTE — Patient Instructions (Signed)

## 2021-11-15 NOTE — Progress Notes (Signed)
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   PRIOR THERAPY: Previous treatment with combination of Hydrea and anagrelide.  CURRENT THERAPY:  1) Revlimid 5 mg by mouth daily 2 weeks on and 2 weeks off.  Started 03/25/2016.  Managed by Dr. Evelene Johnson at [facility]. 2) anagrelide 0.5 mg p.o. twice daily. 3) Aranesp 300 mcg subcutaneously every 3 weeks.  This is switched to Procrit 400 mcg subcutaneously every 2 weeks starting April 13, 2017.  The frequency of the Procrit was changed to weekly schedule when he was in [place].  He is currently on Retacrit weekly as a biosimilar to Procrit.  INTERVAL HISTORY: Roy Johnson 76 y.o. male returns to the clinic today for follow-up visit.  The patient is feeling fine today with no concerning complaints except for the fatigue and abdominal bloating.  He was treated recently for C. difficile.  He denied having any chest pain, shortness of breath, cough or hemoptysis.  He has intermittent nausea and currently on treatment with Zofran.  He was also supposed to take Protonix on regular basis but he does not take it.  He denied having any fever or chills.  He lost around 10 pounds because of the diarrhea from the C. difficile.  He has no headache or visual changes.  He continues to tolerate his treatment with Revlimid, antiglide and Retacrit fairly well.  He is here today for evaluation and repeat blood work.  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  MEDICATIONS:  Current Outpatient Medications  Medication Sig Dispense Refill   amLODipine (NORVASC) 5 MG tablet Take 5 mg by mouth daily.  2   anagrelide (AGRYLIN) 1 MG capsule TAKE 1 CAPSULE(1 MG) BY MOUTH DAILY 90 capsule 0   cetirizine (ZYRTEC) 10 MG tablet TAKE 1 TABLET(10 MG) BY MOUTH DAILY     cholestyramine (QUESTRAN) 4 g packet Take 1 packet (4 g total) by mouth daily. 15 each 0   diphenoxylate-atropine (LOMOTIL) 2.5-0.025 MG tablet Take 1 tablet by mouth 4 (four) times daily as needed.     epoetin alfa-epbx (RETACRIT) 37628 UNIT/ML injection Inject into the skin.     gabapentin (NEURONTIN) 100 MG capsule TAKE 1 CAPSULE BY MOUTH TWICE DAILY AND 2 AT NIGHT 360 capsule 1   gabapentin (NEURONTIN) 100 MG capsule Take by mouth. Take 1 capsule as needed for restless legs.     hyoscyamine (LEVSIN SL) 0.125 MG SL tablet Place 1 tablet (0.125 mg total) under the tongue every 4 (four) hours as needed. 90 tablet 3   lenalidomide (REVLIMID) 5 MG capsule Take 5 mg by mouth daily.     Multiple Vitamin (MULTI-VITAMINS) TABS Take 1 tablet by mouth daily.     ondansetron (ZOFRAN)  8 MG tablet TAKE 1 TABLET(8 MG) BY MOUTH EVERY 8 HOURS AS NEEDED FOR NAUSEA OR VOMITING 20 tablet 0   pantoprazole (PROTONIX) 40 MG tablet Take 1 tablet (40 mg total) by mouth daily. 90 tablet 3   Probiotic Product (ALIGN) 4 MG CAPS Take 1 capsule by mouth daily.     vitamin B-12 (CYANOCOBALAMIN) 1000 MCG tablet Take 1 tablet by mouth daily as needed.     No current facility-administered medications for this visit.   Facility-Administered Medications Ordered in Other Visits  Medication Dose Route Frequency Provider Last Rate Last Admin   0.9 %  sodium chloride infusion  250 mL  Intravenous Once Roy Bears, MD        SURGICAL HISTORY:  Past Surgical History:  Procedure Laterality Date   COLONOSCOPY     POLYPECTOMY     PROSTATECTOMY     TONSILLECTOMY      REVIEW OF SYSTEMS:  A comprehensive review of systems was negative except for: Constitutional: positive for fatigue Gastrointestinal: positive for abdominal pain, diarrhea, and nausea   PHYSICAL EXAMINATION: General appearance: alert, cooperative, appears stated age, fatigued, and no distress Head: Normocephalic, without obvious abnormality, atraumatic Neck: no adenopathy, no JVD, supple, symmetrical, trachea midline, and thyroid not enlarged, symmetric, no tenderness/mass/nodules Lymph nodes: Cervical, supraclavicular, and axillary nodes normal. Resp: clear to auscultation bilaterally Back: symmetric, no curvature. ROM normal. No CVA tenderness. Cardio: regular rate and rhythm, S1, S2 normal, no murmur, click, rub or gallop GI: soft, non-tender; bowel sounds normal; no masses,  no organomegaly Extremities: extremities normal, atraumatic, no cyanosis or edema  ECOG PERFORMANCE STATUS: 1 - Symptomatic but completely ambulatory  Blood pressure 136/69, pulse 77, temperature 97.8 F (36.6 C), temperature source Tympanic, resp. rate 16, weight 156 lb (70.8 kg), SpO2 99 %.  LABORATORY DATA:  Lab Results  Component Value Date   WBC 17.7 (H) 11/15/2021   HGB 10.0 (L) 11/15/2021   HCT 32.2 (L) 11/15/2021   MCV 82.4 11/15/2021   PLT 326 11/15/2021      Chemistry      Component Value Date/Time   NA 141 08/10/2021 1113   NA 141 12/08/2016 0928   K 4.0 08/10/2021 1113   K 4.5 12/08/2016 0928   CL 110 08/10/2021 1113   CO2 28 08/10/2021 1113   CO2 27 12/08/2016 0928   BUN 21 08/10/2021 1113   BUN 14.1 12/08/2016 0928   CREATININE 1.17 08/10/2021 1113   CREATININE 0.9 12/08/2016 0928      Component Value Date/Time   CALCIUM 9.3 08/10/2021 1113   CALCIUM 9.0 12/08/2016 0928   ALKPHOS 54  08/10/2021 1113   ALKPHOS 87 12/08/2016 0928   AST 19 08/10/2021 1113   AST 25 12/08/2016 0928   ALT 19 08/10/2021 1113   ALT 41 12/08/2016 0928   BILITOT 0.9 08/10/2021 1113   BILITOT 0.84 12/08/2016 0928      RADIOGRAPHIC STUDIES:  ASSESSMENT AND PLAN:  This is a very pleasant 76 years old white male with essential thrombocythemia with positive JAK-2 mutation as well as myelodysplastic syndrome. He is currently on treatment with Revlimid 5 mg by mouth daily according to the recommendation from [facility].  He is also on treatment with Aranesp every 3 weeks.  Aranesp was a change it to Procrit initially every 2 weeks and currently on weekly basis.  He mentioned that he is feeling much better on Procrit weekly  The patient has been on treatment with Retacrit  40,000 unit weekly for more than 3 years.  The patient has been tolerating his treatment fairly well with no concerning adverse effect except for the baseline fatigue and recent C. difficile and diarrhea. I recommended for him to continue his current treatment with Retacrit in addition to anagrelide and Revlimid. He also has an appointment with Dr. Evelene Johnson at [facility] for evaluation and follow-up visit. I will see him back for follow-up visit in 3 months with repeat blood work. I will consider repeating his iron study next week. He was advised to call immediately if he has any other concerning symptoms in the interval.  All questions were answered. The patient knows to call the clinic with any problems, questions or concerns. We can certainly see the patient much sooner if necessary.   Disclaimer: This note was dictated with voice recognition software. Similar sounding words can inadvertently be transcribed and may not be corrected upon review.

## 2021-11-18 ENCOUNTER — Encounter: Payer: Self-pay | Admitting: Internal Medicine

## 2021-11-18 ENCOUNTER — Ambulatory Visit: Payer: Medicare Other | Admitting: Internal Medicine

## 2021-11-18 VITALS — BP 114/58 | HR 80 | Ht 66.0 in | Wt 158.2 lb

## 2021-11-18 DIAGNOSIS — Z8619 Personal history of other infectious and parasitic diseases: Secondary | ICD-10-CM | POA: Diagnosis not present

## 2021-11-18 DIAGNOSIS — R197 Diarrhea, unspecified: Secondary | ICD-10-CM

## 2021-11-18 DIAGNOSIS — K219 Gastro-esophageal reflux disease without esophagitis: Secondary | ICD-10-CM

## 2021-11-18 MED ORDER — PANTOPRAZOLE SODIUM 40 MG PO TBEC: 40.0000 mg | DELAYED_RELEASE_TABLET | Freq: Every day | ORAL | 3 refills | Status: DC

## 2021-11-18 NOTE — Progress Notes (Signed)
HISTORY OF PRESENT ILLNESS:  Roy Johnson is a 76 y.o. male with myelodysplastic syndrome and irritable bowel syndrome who presents today for follow-up regarding diarrhea.  Patient has a history of GERD, nausea, diarrhea predominant IBS, diverticulosis with a history of diverticulitis, and negative for neoplasia colonoscopy in 2013.  I last saw the patient in November 2022.  He was seen in this office by the GI physician assistant September 23, 2021 regarding worsening diarrhea.  GI profile stool PCR pathogen panel revealed positive results for C. difficile as well as norovirus.  He was treated with vancomycin for 2 weeks.  His significant diarrhea resolved to his baseline diarrhea.  Probably a bit worse than his baseline diarrhea but significantly better.  He did have some issues with incontinence.  He was retested for C. difficile about 2 weeks ago.  This was negative.  CT scan in early June was negative for any acute process.  Last colonoscopy 2013 as stated.  He describes to me at reflux symptoms.  Not taking his PPI as previously prescribed.  Associated with this he gets nausea.  We discussed colonoscopy.  He is apprehensive at this time due to his myelodysplastic syndrome.  CBC from 3 days ago revealed a white blood cell count of 17.7, hemoglobin 10.0, platelet count 326,000.  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 His smoking use included cigarettes. He has never used smokeless tobacco. He reports that he does not currently use alcohol. He reports that he does not use drugs.  family history includes Breast cancer in his mother; Colon polyps in his father; Heart disease in his father; Lung cancer in his father; Osteoarthritis in his mother; Prostate cancer in his father.  No Known Allergies     PHYSICAL EXAMINATION: Vital signs: BP (!) 114/58   Pulse 80   Ht '5\' 6"'$  (1.676 m)   Wt 158 lb 4 oz (71.8 kg)   BMI 25.54 kg/m   Constitutional: generally well-appearing, no acute distress Psychiatric: alert and oriented x3, cooperative Eyes: extraocular movements intact, anicteric, conjunctiva pink Mouth: oral pharynx moist, no lesions Neck: supple no lymphadenopathy Cardiovascular: heart regular rate and rhythm, no murmur Lungs: clear to auscultation bilaterally Abdomen: soft, nontender, nondistended, no obvious ascites, no peritoneal signs, normal bowel sounds, no organomegaly Rectal: Omitted Extremities: no clubbing, cyanosis, or lower extremity edema bilaterally Skin: no lesions on visible extremities Neuro: No focal deficits.  Cranial nerves intact  ASSESSMENT:  1.  Diarrhea predominant irritable bowel syndrome 2.  Recent stool pathogen panel positive for both C. difficile and norovirus.  Treated with vancomycin for 2 weeks.  Diarrhea back to baseline.  Repeat C. difficile testing negative. 3.  GERD symptoms 4.  Myelodysplastic syndrome 5.  Negative for neoplasia colonoscopy 2013 6.  History of diverticular disease   PLAN:  1.  Citrucel 2 tablespoons daily 2.  Imodium as needed 3.  Prescribed Protonix 40 mg daily. 4.  Office follow-up 3 to 4 months A total time of 30 minutes was spent trying to see the patient, reviewing laboratory and microbiology studies, reviewing previous encounter,  obtaining comprehensive history, performing medically appropriate physical examination, counseling and educating the patient regarding the above listed issues, prescribing medications, arranging follow-up, and documenting clinical information in the health record

## 2021-11-18 NOTE — Patient Instructions (Signed)
_______________________________________________________  If you are age 76 or older, your body mass index should be between 23-30. Your Body mass index is 25.54 kg/m. If this is out of the aforementioned range listed, please consider follow up with your Primary Care Provider.  If you are age 21 or younger, your body mass index should be between 19-25. Your Body mass index is 25.54 kg/m. If this is out of the aformentioned range listed, please consider follow up with your Primary Care Provider.   ________________________________________________________  The [facility] providers would like to encourage you to use [facility] to communicate with providers for non-urgent requests or questions.  Due to long hold times on the telephone, sending your provider a message by [facility] may be a faster and more efficient way to get a response.  Please allow 48 business hours for a response.  Please remember that this is for non-urgent requests.  _______________________________________________________  We have sent the following medications to your pharmacy for you to pick up at your convenience: Protonix  Continue Imodium as needed  Take 2 tbsp of Citrucel daily  Please follow up in 3-4 months

## 2021-11-19 ENCOUNTER — Other Ambulatory Visit: Payer: Self-pay

## 2021-11-19 DIAGNOSIS — D469 Myelodysplastic syndrome, unspecified: Secondary | ICD-10-CM

## 2021-11-22 ENCOUNTER — Inpatient Hospital Stay: Payer: Medicare Other

## 2021-11-22 ENCOUNTER — Other Ambulatory Visit: Payer: Self-pay

## 2021-11-22 VITALS — BP 139/74 | HR 71 | Temp 98.0°F | Resp 18

## 2021-11-22 DIAGNOSIS — D469 Myelodysplastic syndrome, unspecified: Secondary | ICD-10-CM

## 2021-11-22 DIAGNOSIS — D473 Essential (hemorrhagic) thrombocythemia: Secondary | ICD-10-CM

## 2021-11-22 DIAGNOSIS — D461 Refractory anemia with ring sideroblasts: Secondary | ICD-10-CM | POA: Diagnosis not present

## 2021-11-22 LAB — CBC WITH DIFFERENTIAL (CANCER CENTER ONLY)
Abs Immature Granulocytes: 1.03 10*3/uL — ABNORMAL HIGH (ref 0.00–0.07)
Basophils Absolute: 0.2 10*3/uL — ABNORMAL HIGH (ref 0.0–0.1)
Basophils Relative: 2 %
Eosinophils Absolute: 1.1 10*3/uL — ABNORMAL HIGH (ref 0.0–0.5)
Eosinophils Relative: 7 %
HCT: 33.7 % — ABNORMAL LOW (ref 39.0–52.0)
Hemoglobin: 10.5 g/dL — ABNORMAL LOW (ref 13.0–17.0)
Immature Granulocytes: 7 %
Lymphocytes Relative: 12 %
Lymphs Abs: 1.9 10*3/uL (ref 0.7–4.0)
MCH: 26.1 pg (ref 26.0–34.0)
MCHC: 31.2 g/dL (ref 30.0–36.0)
MCV: 83.6 fL (ref 80.0–100.0)
Monocytes Absolute: 0.5 10*3/uL (ref 0.1–1.0)
Monocytes Relative: 3 %
Neutro Abs: 10.6 10*3/uL — ABNORMAL HIGH (ref 1.7–7.7)
Neutrophils Relative %: 69 %
Platelet Count: 230 10*3/uL (ref 150–400)
RBC: 4.03 MIL/uL — ABNORMAL LOW (ref 4.22–5.81)
RDW: 34.1 % — ABNORMAL HIGH (ref 11.5–15.5)
WBC Count: 15.3 10*3/uL — ABNORMAL HIGH (ref 4.0–10.5)
nRBC: 0.1 % (ref 0.0–0.2)

## 2021-11-22 LAB — CMP (CANCER CENTER ONLY)
ALT: 13 U/L (ref 0–44)
AST: 13 U/L — ABNORMAL LOW (ref 15–41)
Albumin: 4.3 g/dL (ref 3.5–5.0)
Alkaline Phosphatase: 56 U/L (ref 38–126)
Anion gap: 4 — ABNORMAL LOW (ref 5–15)
BUN: 22 mg/dL (ref 8–23)
CO2: 27 mmol/L (ref 22–32)
Calcium: 9.4 mg/dL (ref 8.9–10.3)
Chloride: 109 mmol/L (ref 98–111)
Creatinine: 1.24 mg/dL (ref 0.61–1.24)
GFR, Estimated: >60 mL/min (ref >60)
Glucose, Bld: 138 mg/dL — ABNORMAL HIGH (ref 70–99)
Potassium: 4.2 mmol/L (ref 3.5–5.1)
Sodium: 140 mmol/L (ref 135–145)
Total Bilirubin: 0.9 mg/dL (ref 0.3–1.2)
Total Protein: 7 g/dL (ref 6.5–8.1)

## 2021-11-22 LAB — FERRITIN: Ferritin: 394 ng/mL — ABNORMAL HIGH (ref 24–336)

## 2021-11-22 LAB — IRON AND IRON BINDING CAPACITY (CC-WL,HP ONLY)
Iron: 85 ug/dL (ref 45–182)
Saturation Ratios: 35 % (ref 17.9–39.5)
TIBC: 241 ug/dL — ABNORMAL LOW (ref 250–450)
UIBC: 156 ug/dL (ref 117–376)

## 2021-11-22 MED ORDER — EPOETIN ALFA-EPBX 40000 UNIT/ML IJ SOLN
40000.0000 [IU] | Freq: Once | INTRAMUSCULAR | Status: AC
  Administered 2021-11-22: 40000 [IU] via SUBCUTANEOUS
  Filled 2021-11-22: qty 1

## 2021-11-22 NOTE — Patient Instructions (Signed)

## 2021-11-29 ENCOUNTER — Other Ambulatory Visit: Payer: Self-pay

## 2021-11-29 ENCOUNTER — Inpatient Hospital Stay: Payer: Medicare Other

## 2021-11-29 VITALS — BP 142/79 | HR 67 | Temp 98.4°F | Resp 18

## 2021-11-29 DIAGNOSIS — D461 Refractory anemia with ring sideroblasts: Secondary | ICD-10-CM | POA: Diagnosis not present

## 2021-11-29 DIAGNOSIS — D469 Myelodysplastic syndrome, unspecified: Secondary | ICD-10-CM

## 2021-11-29 LAB — CBC WITH DIFFERENTIAL (CANCER CENTER ONLY)
Abs Immature Granulocytes: 0.46 10*3/uL — ABNORMAL HIGH (ref 0.00–0.07)
Basophils Absolute: 0.2 10*3/uL — ABNORMAL HIGH (ref 0.0–0.1)
Basophils Relative: 1 %
Eosinophils Absolute: 2 10*3/uL — ABNORMAL HIGH (ref 0.0–0.5)
Eosinophils Relative: 13 %
HCT: 32.5 % — ABNORMAL LOW (ref 39.0–52.0)
Hemoglobin: 10.3 g/dL — ABNORMAL LOW (ref 13.0–17.0)
Immature Granulocytes: 3 %
Lymphocytes Relative: 16 %
Lymphs Abs: 2.5 10*3/uL (ref 0.7–4.0)
MCH: 25.4 pg — ABNORMAL LOW (ref 26.0–34.0)
MCHC: 31.7 g/dL (ref 30.0–36.0)
MCV: 80.2 fL (ref 80.0–100.0)
Monocytes Absolute: 1.3 10*3/uL — ABNORMAL HIGH (ref 0.1–1.0)
Monocytes Relative: 8 %
Neutro Abs: 9.4 10*3/uL — ABNORMAL HIGH (ref 1.7–7.7)
Neutrophils Relative %: 59 %
Platelet Count: 251 10*3/uL (ref 150–400)
RBC: 4.05 MIL/uL — ABNORMAL LOW (ref 4.22–5.81)
RDW: 34.8 % — ABNORMAL HIGH (ref 11.5–15.5)
WBC Count: 15.8 10*3/uL — ABNORMAL HIGH (ref 4.0–10.5)
WBC Morphology: INCREASED
nRBC: 0 % (ref 0.0–0.2)

## 2021-11-29 LAB — CMP (CANCER CENTER ONLY)
ALT: 20 U/L (ref 0–44)
AST: 14 U/L — ABNORMAL LOW (ref 15–41)
Albumin: 4.5 g/dL (ref 3.5–5.0)
Alkaline Phosphatase: 53 U/L (ref 38–126)
Anion gap: 4 — ABNORMAL LOW (ref 5–15)
BUN: 24 mg/dL — ABNORMAL HIGH (ref 8–23)
CO2: 27 mmol/L (ref 22–32)
Calcium: 9.5 mg/dL (ref 8.9–10.3)
Chloride: 107 mmol/L (ref 98–111)
Creatinine: 1.21 mg/dL (ref 0.61–1.24)
GFR, Estimated: >60 mL/min (ref >60)
Glucose, Bld: 104 mg/dL — ABNORMAL HIGH (ref 70–99)
Potassium: 4.7 mmol/L (ref 3.5–5.1)
Sodium: 138 mmol/L (ref 135–145)
Total Bilirubin: 0.8 mg/dL (ref 0.3–1.2)
Total Protein: 7 g/dL (ref 6.5–8.1)

## 2021-11-29 MED ORDER — EPOETIN ALFA-EPBX 40000 UNIT/ML IJ SOLN
40000.0000 [IU] | Freq: Once | INTRAMUSCULAR | Status: AC
  Administered 2021-11-29: 40000 [IU] via SUBCUTANEOUS
  Filled 2021-11-29: qty 1

## 2021-12-06 ENCOUNTER — Inpatient Hospital Stay: Payer: Medicare Other

## 2021-12-06 ENCOUNTER — Other Ambulatory Visit: Payer: Self-pay

## 2021-12-06 VITALS — BP 128/75 | HR 74 | Temp 97.9°F | Resp 18

## 2021-12-06 DIAGNOSIS — D469 Myelodysplastic syndrome, unspecified: Secondary | ICD-10-CM

## 2021-12-06 DIAGNOSIS — D461 Refractory anemia with ring sideroblasts: Secondary | ICD-10-CM | POA: Diagnosis not present

## 2021-12-06 LAB — CBC WITH DIFFERENTIAL (CANCER CENTER ONLY)
Abs Immature Granulocytes: 0.11 10*3/uL — ABNORMAL HIGH (ref 0.00–0.07)
Basophils Absolute: 0.2 10*3/uL — ABNORMAL HIGH (ref 0.0–0.1)
Basophils Relative: 2 %
Eosinophils Absolute: 0.8 10*3/uL — ABNORMAL HIGH (ref 0.0–0.5)
Eosinophils Relative: 9 %
HCT: 32.1 % — ABNORMAL LOW (ref 39.0–52.0)
Hemoglobin: 10.3 g/dL — ABNORMAL LOW (ref 13.0–17.0)
Immature Granulocytes: 1 %
Lymphocytes Relative: 18 %
Lymphs Abs: 1.7 10*3/uL (ref 0.7–4.0)
MCH: 25.8 pg — ABNORMAL LOW (ref 26.0–34.0)
MCHC: 32.1 g/dL (ref 30.0–36.0)
MCV: 80.3 fL (ref 80.0–100.0)
Monocytes Absolute: 0.6 10*3/uL (ref 0.1–1.0)
Monocytes Relative: 7 %
Neutro Abs: 5.9 10*3/uL (ref 1.7–7.7)
Neutrophils Relative %: 63 %
Platelet Count: 212 10*3/uL (ref 150–400)
RBC: 4 MIL/uL — ABNORMAL LOW (ref 4.22–5.81)
RDW: 34.5 % — ABNORMAL HIGH (ref 11.5–15.5)
WBC Count: 9.3 10*3/uL (ref 4.0–10.5)
nRBC: 0 % (ref 0.0–0.2)

## 2021-12-06 LAB — CMP (CANCER CENTER ONLY)
ALT: 26 U/L (ref 0–44)
AST: 13 U/L — ABNORMAL LOW (ref 15–41)
Albumin: 4.5 g/dL (ref 3.5–5.0)
Alkaline Phosphatase: 49 U/L (ref 38–126)
Anion gap: 4 — ABNORMAL LOW (ref 5–15)
BUN: 24 mg/dL — ABNORMAL HIGH (ref 8–23)
CO2: 28 mmol/L (ref 22–32)
Calcium: 9.8 mg/dL (ref 8.9–10.3)
Chloride: 108 mmol/L (ref 98–111)
Creatinine: 1.3 mg/dL — ABNORMAL HIGH (ref 0.61–1.24)
GFR, Estimated: 57 mL/min — ABNORMAL LOW (ref >60)
Glucose, Bld: 93 mg/dL (ref 70–99)
Potassium: 4.6 mmol/L (ref 3.5–5.1)
Sodium: 140 mmol/L (ref 135–145)
Total Bilirubin: 1 mg/dL (ref 0.3–1.2)
Total Protein: 7.1 g/dL (ref 6.5–8.1)

## 2021-12-06 MED ORDER — EPOETIN ALFA-EPBX 40000 UNIT/ML IJ SOLN
40000.0000 [IU] | Freq: Once | INTRAMUSCULAR | Status: AC
  Administered 2021-12-06: 40000 [IU] via SUBCUTANEOUS
  Filled 2021-12-06: qty 1

## 2021-12-10 ENCOUNTER — Telehealth: Payer: Self-pay | Admitting: Internal Medicine

## 2021-12-10 NOTE — Telephone Encounter (Signed)
Rescheduled 09/01 scheduled message, patient has been called and notified.

## 2021-12-14 ENCOUNTER — Inpatient Hospital Stay: Payer: Medicare Other | Attending: Internal Medicine

## 2021-12-14 ENCOUNTER — Other Ambulatory Visit: Payer: Self-pay

## 2021-12-14 ENCOUNTER — Inpatient Hospital Stay: Payer: Medicare Other

## 2021-12-14 VITALS — BP 139/76 | HR 70 | Temp 98.1°F | Resp 18

## 2021-12-14 DIAGNOSIS — Z79899 Other long term (current) drug therapy: Secondary | ICD-10-CM | POA: Insufficient documentation

## 2021-12-14 DIAGNOSIS — D461 Refractory anemia with ring sideroblasts: Secondary | ICD-10-CM | POA: Diagnosis present

## 2021-12-14 DIAGNOSIS — D473 Essential (hemorrhagic) thrombocythemia: Secondary | ICD-10-CM | POA: Diagnosis present

## 2021-12-14 DIAGNOSIS — D469 Myelodysplastic syndrome, unspecified: Secondary | ICD-10-CM

## 2021-12-14 LAB — CBC WITH DIFFERENTIAL (CANCER CENTER ONLY)
Abs Immature Granulocytes: 0.67 10*3/uL — ABNORMAL HIGH (ref 0.00–0.07)
Basophils Absolute: 0.3 10*3/uL — ABNORMAL HIGH (ref 0.0–0.1)
Basophils Relative: 2 %
Eosinophils Absolute: 0.3 10*3/uL (ref 0.0–0.5)
Eosinophils Relative: 3 %
HCT: 30.8 % — ABNORMAL LOW (ref 39.0–52.0)
Hemoglobin: 9.5 g/dL — ABNORMAL LOW (ref 13.0–17.0)
Immature Granulocytes: 6 %
Lymphocytes Relative: 16 %
Lymphs Abs: 1.8 10*3/uL (ref 0.7–4.0)
MCH: 25.1 pg — ABNORMAL LOW (ref 26.0–34.0)
MCHC: 30.8 g/dL (ref 30.0–36.0)
MCV: 81.3 fL (ref 80.0–100.0)
Monocytes Absolute: 0.6 10*3/uL (ref 0.1–1.0)
Monocytes Relative: 6 %
Neutro Abs: 7.6 10*3/uL (ref 1.7–7.7)
Neutrophils Relative %: 67 %
Platelet Count: 232 10*3/uL (ref 150–400)
RBC: 3.79 MIL/uL — ABNORMAL LOW (ref 4.22–5.81)
RDW: 35.6 % — ABNORMAL HIGH (ref 11.5–15.5)
WBC Count: 11.2 10*3/uL — ABNORMAL HIGH (ref 4.0–10.5)
nRBC: 0.4 % — ABNORMAL HIGH (ref 0.0–0.2)

## 2021-12-14 LAB — CMP (CANCER CENTER ONLY)
ALT: 14 U/L (ref 0–44)
AST: 12 U/L — ABNORMAL LOW (ref 15–41)
Albumin: 4.5 g/dL (ref 3.5–5.0)
Alkaline Phosphatase: 51 U/L (ref 38–126)
Anion gap: 4 — ABNORMAL LOW (ref 5–15)
BUN: 24 mg/dL — ABNORMAL HIGH (ref 8–23)
CO2: 28 mmol/L (ref 22–32)
Calcium: 9.6 mg/dL (ref 8.9–10.3)
Chloride: 109 mmol/L (ref 98–111)
Creatinine: 1.36 mg/dL — ABNORMAL HIGH (ref 0.61–1.24)
GFR, Estimated: 54 mL/min — ABNORMAL LOW (ref >60)
Glucose, Bld: 99 mg/dL (ref 70–99)
Potassium: 4.7 mmol/L (ref 3.5–5.1)
Sodium: 141 mmol/L (ref 135–145)
Total Bilirubin: 0.8 mg/dL (ref 0.3–1.2)
Total Protein: 7 g/dL (ref 6.5–8.1)

## 2021-12-14 MED ORDER — EPOETIN ALFA-EPBX 40000 UNIT/ML IJ SOLN: 40000.0000 [IU] | Freq: Once | INTRAMUSCULAR | Status: DC

## 2021-12-14 MED ORDER — EPOETIN ALFA-EPBX 20000 UNIT/ML IJ SOLN
40000.0000 [IU] | Freq: Once | INTRAMUSCULAR | Status: AC
  Administered 2021-12-14: 40000 [IU] via SUBCUTANEOUS
  Filled 2021-12-14: qty 2

## 2021-12-20 ENCOUNTER — Other Ambulatory Visit: Payer: Medicare Other

## 2021-12-20 ENCOUNTER — Ambulatory Visit: Payer: Medicare Other

## 2021-12-21 ENCOUNTER — Inpatient Hospital Stay: Payer: Medicare Other

## 2021-12-21 ENCOUNTER — Other Ambulatory Visit: Payer: Self-pay

## 2021-12-21 VITALS — BP 129/58 | HR 70 | Temp 97.9°F | Resp 18

## 2021-12-21 DIAGNOSIS — D461 Refractory anemia with ring sideroblasts: Secondary | ICD-10-CM | POA: Diagnosis not present

## 2021-12-21 DIAGNOSIS — D469 Myelodysplastic syndrome, unspecified: Secondary | ICD-10-CM

## 2021-12-21 LAB — CBC WITH DIFFERENTIAL (CANCER CENTER ONLY)
Abs Immature Granulocytes: 1.51 10*3/uL — ABNORMAL HIGH (ref 0.00–0.07)
Basophils Absolute: 0.2 10*3/uL — ABNORMAL HIGH (ref 0.0–0.1)
Basophils Relative: 1 %
Eosinophils Absolute: 0.6 10*3/uL — ABNORMAL HIGH (ref 0.0–0.5)
Eosinophils Relative: 4 %
HCT: 30.8 % — ABNORMAL LOW (ref 39.0–52.0)
Hemoglobin: 9.6 g/dL — ABNORMAL LOW (ref 13.0–17.0)
Immature Granulocytes: 11 %
Lymphocytes Relative: 11 %
Lymphs Abs: 1.6 10*3/uL (ref 0.7–4.0)
MCH: 25.7 pg — ABNORMAL LOW (ref 26.0–34.0)
MCHC: 31.2 g/dL (ref 30.0–36.0)
MCV: 82.4 fL (ref 80.0–100.0)
Monocytes Absolute: 0.6 10*3/uL (ref 0.1–1.0)
Monocytes Relative: 4 %
Neutro Abs: 9.7 10*3/uL — ABNORMAL HIGH (ref 1.7–7.7)
Neutrophils Relative %: 69 %
Platelet Count: 257 10*3/uL (ref 150–400)
RBC: 3.74 MIL/uL — ABNORMAL LOW (ref 4.22–5.81)
RDW: 33.9 % — ABNORMAL HIGH (ref 11.5–15.5)
Smear Review: ADEQUATE
WBC Count: 14.2 10*3/uL — ABNORMAL HIGH (ref 4.0–10.5)
nRBC: 0.1 % (ref 0.0–0.2)

## 2021-12-21 LAB — CMP (CANCER CENTER ONLY)
ALT: 13 U/L (ref 0–44)
AST: 13 U/L — ABNORMAL LOW (ref 15–41)
Albumin: 4.5 g/dL (ref 3.5–5.0)
Alkaline Phosphatase: 49 U/L (ref 38–126)
Anion gap: 5 (ref 5–15)
BUN: 19 mg/dL (ref 8–23)
CO2: 28 mmol/L (ref 22–32)
Calcium: 9.7 mg/dL (ref 8.9–10.3)
Chloride: 107 mmol/L (ref 98–111)
Creatinine: 1.24 mg/dL (ref 0.61–1.24)
GFR, Estimated: >60 mL/min (ref >60)
Glucose, Bld: 112 mg/dL — ABNORMAL HIGH (ref 70–99)
Potassium: 4.3 mmol/L (ref 3.5–5.1)
Sodium: 140 mmol/L (ref 135–145)
Total Bilirubin: 0.9 mg/dL (ref 0.3–1.2)
Total Protein: 7.1 g/dL (ref 6.5–8.1)

## 2021-12-21 MED ORDER — EPOETIN ALFA-EPBX 40000 UNIT/ML IJ SOLN: 40000.0000 [IU] | Freq: Once | INTRAMUSCULAR | Status: DC

## 2021-12-21 MED ORDER — EPOETIN ALFA-EPBX 20000 UNIT/ML IJ SOLN
40000.0000 [IU] | Freq: Once | INTRAMUSCULAR | Status: AC
  Administered 2021-12-21: 40000 [IU] via SUBCUTANEOUS
  Filled 2021-12-21: qty 2

## 2021-12-27 ENCOUNTER — Inpatient Hospital Stay: Payer: Medicare Other

## 2021-12-27 ENCOUNTER — Other Ambulatory Visit: Payer: Self-pay

## 2021-12-27 VITALS — BP 138/68 | HR 73 | Temp 98.4°F | Resp 20

## 2021-12-27 DIAGNOSIS — D469 Myelodysplastic syndrome, unspecified: Secondary | ICD-10-CM

## 2021-12-27 DIAGNOSIS — D461 Refractory anemia with ring sideroblasts: Secondary | ICD-10-CM | POA: Diagnosis not present

## 2021-12-27 LAB — CMP (CANCER CENTER ONLY)
ALT: 18 U/L (ref 0–44)
AST: 15 U/L (ref 15–41)
Albumin: 4.3 g/dL (ref 3.5–5.0)
Alkaline Phosphatase: 61 U/L (ref 38–126)
Anion gap: 6 (ref 5–15)
BUN: 21 mg/dL (ref 8–23)
CO2: 25 mmol/L (ref 22–32)
Calcium: 9.2 mg/dL (ref 8.9–10.3)
Chloride: 110 mmol/L (ref 98–111)
Creatinine: 1.11 mg/dL (ref 0.61–1.24)
GFR, Estimated: >60 mL/min (ref >60)
Glucose, Bld: 99 mg/dL (ref 70–99)
Potassium: 3.9 mmol/L (ref 3.5–5.1)
Sodium: 141 mmol/L (ref 135–145)
Total Bilirubin: 0.5 mg/dL (ref 0.3–1.2)
Total Protein: 7.2 g/dL (ref 6.5–8.1)

## 2021-12-27 LAB — CBC WITH DIFFERENTIAL (CANCER CENTER ONLY)
Abs Immature Granulocytes: 0.65 10*3/uL — ABNORMAL HIGH (ref 0.00–0.07)
Basophils Absolute: 0.1 10*3/uL (ref 0.0–0.1)
Basophils Relative: 1 %
Eosinophils Absolute: 1.2 10*3/uL — ABNORMAL HIGH (ref 0.0–0.5)
Eosinophils Relative: 9 %
HCT: 31.1 % — ABNORMAL LOW (ref 39.0–52.0)
Hemoglobin: 9.6 g/dL — ABNORMAL LOW (ref 13.0–17.0)
Immature Granulocytes: 5 %
Lymphocytes Relative: 13 %
Lymphs Abs: 1.7 10*3/uL (ref 0.7–4.0)
MCH: 25.4 pg — ABNORMAL LOW (ref 26.0–34.0)
MCHC: 30.9 g/dL (ref 30.0–36.0)
MCV: 82.3 fL (ref 80.0–100.0)
Monocytes Absolute: 1.2 10*3/uL — ABNORMAL HIGH (ref 0.1–1.0)
Monocytes Relative: 9 %
Neutro Abs: 8.7 10*3/uL — ABNORMAL HIGH (ref 1.7–7.7)
Neutrophils Relative %: 63 %
Platelet Count: 225 10*3/uL (ref 150–400)
RBC: 3.78 MIL/uL — ABNORMAL LOW (ref 4.22–5.81)
RDW: 35.4 % — ABNORMAL HIGH (ref 11.5–15.5)
WBC Count: 13.6 10*3/uL — ABNORMAL HIGH (ref 4.0–10.5)
nRBC: 0 % (ref 0.0–0.2)

## 2021-12-27 MED ORDER — EPOETIN ALFA-EPBX 40000 UNIT/ML IJ SOLN: 40000.0000 [IU] | Freq: Once | INTRAMUSCULAR | Status: DC

## 2021-12-27 MED ORDER — EPOETIN ALFA-EPBX 20000 UNIT/ML IJ SOLN
40000.0000 [IU] | Freq: Once | INTRAMUSCULAR | Status: AC
  Administered 2021-12-27: 40000 [IU] via SUBCUTANEOUS
  Filled 2021-12-27: qty 2

## 2021-12-28 ENCOUNTER — Other Ambulatory Visit: Payer: Self-pay | Admitting: Internal Medicine

## 2022-01-03 ENCOUNTER — Other Ambulatory Visit: Payer: Self-pay

## 2022-01-03 ENCOUNTER — Inpatient Hospital Stay: Payer: Medicare Other

## 2022-01-03 VITALS — BP 121/75 | HR 75 | Temp 98.0°F | Resp 18

## 2022-01-03 DIAGNOSIS — D461 Refractory anemia with ring sideroblasts: Secondary | ICD-10-CM | POA: Diagnosis not present

## 2022-01-03 DIAGNOSIS — D469 Myelodysplastic syndrome, unspecified: Secondary | ICD-10-CM

## 2022-01-03 LAB — CMP (CANCER CENTER ONLY)
ALT: 25 U/L (ref 0–44)
AST: 16 U/L (ref 15–41)
Albumin: 4.5 g/dL (ref 3.5–5.0)
Alkaline Phosphatase: 50 U/L (ref 38–126)
Anion gap: 3 — ABNORMAL LOW (ref 5–15)
BUN: 26 mg/dL — ABNORMAL HIGH (ref 8–23)
CO2: 29 mmol/L (ref 22–32)
Calcium: 9.2 mg/dL (ref 8.9–10.3)
Chloride: 108 mmol/L (ref 98–111)
Creatinine: 1.24 mg/dL (ref 0.61–1.24)
GFR, Estimated: >60 mL/min (ref >60)
Glucose, Bld: 105 mg/dL — ABNORMAL HIGH (ref 70–99)
Potassium: 4.5 mmol/L (ref 3.5–5.1)
Sodium: 140 mmol/L (ref 135–145)
Total Bilirubin: 0.9 mg/dL (ref 0.3–1.2)
Total Protein: 7.2 g/dL (ref 6.5–8.1)

## 2022-01-03 LAB — CBC WITH DIFFERENTIAL (CANCER CENTER ONLY)
Abs Immature Granulocytes: 0.14 10*3/uL — ABNORMAL HIGH (ref 0.00–0.07)
Basophils Absolute: 0.2 10*3/uL — ABNORMAL HIGH (ref 0.0–0.1)
Basophils Relative: 2 %
Eosinophils Absolute: 0.7 10*3/uL — ABNORMAL HIGH (ref 0.0–0.5)
Eosinophils Relative: 8 %
HCT: 30.2 % — ABNORMAL LOW (ref 39.0–52.0)
Hemoglobin: 9.6 g/dL — ABNORMAL LOW (ref 13.0–17.0)
Immature Granulocytes: 2 %
Lymphocytes Relative: 20 %
Lymphs Abs: 1.6 10*3/uL (ref 0.7–4.0)
MCH: 25.6 pg — ABNORMAL LOW (ref 26.0–34.0)
MCHC: 31.8 g/dL (ref 30.0–36.0)
MCV: 80.5 fL (ref 80.0–100.0)
Monocytes Absolute: 0.6 10*3/uL (ref 0.1–1.0)
Monocytes Relative: 7 %
Neutro Abs: 4.9 10*3/uL (ref 1.7–7.7)
Neutrophils Relative %: 61 %
Platelet Count: 202 10*3/uL (ref 150–400)
RBC: 3.75 MIL/uL — ABNORMAL LOW (ref 4.22–5.81)
RDW: 35.7 % — ABNORMAL HIGH (ref 11.5–15.5)
WBC Count: 8.1 10*3/uL (ref 4.0–10.5)
nRBC: 0 % (ref 0.0–0.2)

## 2022-01-03 MED ORDER — EPOETIN ALFA-EPBX 40000 UNIT/ML IJ SOLN
40000.0000 [IU] | Freq: Once | INTRAMUSCULAR | Status: AC
  Administered 2022-01-03: 40000 [IU] via SUBCUTANEOUS
  Filled 2022-01-03: qty 1

## 2022-01-10 ENCOUNTER — Other Ambulatory Visit: Payer: Self-pay

## 2022-01-10 ENCOUNTER — Inpatient Hospital Stay: Payer: Medicare Other | Attending: Internal Medicine

## 2022-01-10 ENCOUNTER — Inpatient Hospital Stay: Payer: Medicare Other

## 2022-01-10 VITALS — BP 134/74 | HR 67 | Temp 97.9°F | Resp 18

## 2022-01-10 DIAGNOSIS — D469 Myelodysplastic syndrome, unspecified: Secondary | ICD-10-CM

## 2022-01-10 DIAGNOSIS — Z79899 Other long term (current) drug therapy: Secondary | ICD-10-CM | POA: Insufficient documentation

## 2022-01-10 DIAGNOSIS — D72829 Elevated white blood cell count, unspecified: Secondary | ICD-10-CM | POA: Diagnosis not present

## 2022-01-10 DIAGNOSIS — D473 Essential (hemorrhagic) thrombocythemia: Secondary | ICD-10-CM | POA: Diagnosis present

## 2022-01-10 DIAGNOSIS — D461 Refractory anemia with ring sideroblasts: Secondary | ICD-10-CM | POA: Insufficient documentation

## 2022-01-10 LAB — CMP (CANCER CENTER ONLY)
ALT: 16 U/L (ref 0–44)
AST: 13 U/L — ABNORMAL LOW (ref 15–41)
Albumin: 4.4 g/dL (ref 3.5–5.0)
Alkaline Phosphatase: 50 U/L (ref 38–126)
Anion gap: 5 (ref 5–15)
BUN: 23 mg/dL (ref 8–23)
CO2: 28 mmol/L (ref 22–32)
Calcium: 9.3 mg/dL (ref 8.9–10.3)
Chloride: 108 mmol/L (ref 98–111)
Creatinine: 1.2 mg/dL (ref 0.61–1.24)
GFR, Estimated: >60 mL/min (ref >60)
Glucose, Bld: 98 mg/dL (ref 70–99)
Potassium: 4.5 mmol/L (ref 3.5–5.1)
Sodium: 141 mmol/L (ref 135–145)
Total Bilirubin: 0.9 mg/dL (ref 0.3–1.2)
Total Protein: 6.7 g/dL (ref 6.5–8.1)

## 2022-01-10 LAB — CBC WITH DIFFERENTIAL (CANCER CENTER ONLY)
Abs Immature Granulocytes: 0.37 10*3/uL — ABNORMAL HIGH (ref 0.00–0.07)
Basophils Absolute: 0.2 10*3/uL — ABNORMAL HIGH (ref 0.0–0.1)
Basophils Relative: 2 %
Eosinophils Absolute: 0.4 10*3/uL (ref 0.0–0.5)
Eosinophils Relative: 4 %
HCT: 29.9 % — ABNORMAL LOW (ref 39.0–52.0)
Hemoglobin: 9.5 g/dL — ABNORMAL LOW (ref 13.0–17.0)
Immature Granulocytes: 4 %
Lymphocytes Relative: 19 %
Lymphs Abs: 1.7 10*3/uL (ref 0.7–4.0)
MCH: 25.7 pg — ABNORMAL LOW (ref 26.0–34.0)
MCHC: 31.8 g/dL (ref 30.0–36.0)
MCV: 81 fL (ref 80.0–100.0)
Monocytes Absolute: 0.6 10*3/uL (ref 0.1–1.0)
Monocytes Relative: 7 %
Neutro Abs: 5.7 10*3/uL (ref 1.7–7.7)
Neutrophils Relative %: 64 %
Platelet Count: 213 10*3/uL (ref 150–400)
RBC: 3.69 MIL/uL — ABNORMAL LOW (ref 4.22–5.81)
RDW: 35.8 % — ABNORMAL HIGH (ref 11.5–15.5)
WBC Count: 8.9 10*3/uL (ref 4.0–10.5)
nRBC: 0.5 % — ABNORMAL HIGH (ref 0.0–0.2)

## 2022-01-10 MED ORDER — EPOETIN ALFA-EPBX 40000 UNIT/ML IJ SOLN
40000.0000 [IU] | Freq: Once | INTRAMUSCULAR | Status: AC
  Administered 2022-01-10: 40000 [IU] via SUBCUTANEOUS
  Filled 2022-01-10: qty 1

## 2022-01-12 LAB — PATHOLOGIST SMEAR REVIEW

## 2022-01-17 ENCOUNTER — Inpatient Hospital Stay: Payer: Medicare Other

## 2022-01-17 VITALS — BP 128/78 | HR 62 | Temp 98.2°F | Resp 18

## 2022-01-17 DIAGNOSIS — D461 Refractory anemia with ring sideroblasts: Secondary | ICD-10-CM | POA: Diagnosis not present

## 2022-01-17 DIAGNOSIS — D469 Myelodysplastic syndrome, unspecified: Secondary | ICD-10-CM

## 2022-01-17 LAB — CBC WITH DIFFERENTIAL (CANCER CENTER ONLY)
Abs Immature Granulocytes: 1.31 10*3/uL — ABNORMAL HIGH (ref 0.00–0.07)
Basophils Absolute: 0.2 10*3/uL — ABNORMAL HIGH (ref 0.0–0.1)
Basophils Relative: 2 %
Eosinophils Absolute: 0.9 10*3/uL — ABNORMAL HIGH (ref 0.0–0.5)
Eosinophils Relative: 6 %
HCT: 33.3 % — ABNORMAL LOW (ref 39.0–52.0)
Hemoglobin: 10.2 g/dL — ABNORMAL LOW (ref 13.0–17.0)
Immature Granulocytes: 8 %
Lymphocytes Relative: 14 %
Lymphs Abs: 2.2 10*3/uL (ref 0.7–4.0)
MCH: 25.1 pg — ABNORMAL LOW (ref 26.0–34.0)
MCHC: 30.6 g/dL (ref 30.0–36.0)
MCV: 81.8 fL (ref 80.0–100.0)
Monocytes Absolute: 0.7 10*3/uL (ref 0.1–1.0)
Monocytes Relative: 4 %
Neutro Abs: 10.3 10*3/uL — ABNORMAL HIGH (ref 1.7–7.7)
Neutrophils Relative %: 66 %
Platelet Count: 292 10*3/uL (ref 150–400)
RBC: 4.07 MIL/uL — ABNORMAL LOW (ref 4.22–5.81)
RDW: 35.8 % — ABNORMAL HIGH (ref 11.5–15.5)
WBC Count: 15.6 10*3/uL — ABNORMAL HIGH (ref 4.0–10.5)
nRBC: 0.1 % (ref 0.0–0.2)

## 2022-01-17 LAB — CMP (CANCER CENTER ONLY)
ALT: 14 U/L (ref 0–44)
AST: 11 U/L — ABNORMAL LOW (ref 15–41)
Albumin: 4.4 g/dL (ref 3.5–5.0)
Alkaline Phosphatase: 54 U/L (ref 38–126)
Anion gap: 6 (ref 5–15)
BUN: 23 mg/dL (ref 8–23)
CO2: 26 mmol/L (ref 22–32)
Calcium: 9 mg/dL (ref 8.9–10.3)
Chloride: 108 mmol/L (ref 98–111)
Creatinine: 1.27 mg/dL — ABNORMAL HIGH (ref 0.61–1.24)
GFR, Estimated: 59 mL/min — ABNORMAL LOW (ref >60)
Glucose, Bld: 98 mg/dL (ref 70–99)
Potassium: 4.4 mmol/L (ref 3.5–5.1)
Sodium: 140 mmol/L (ref 135–145)
Total Bilirubin: 0.9 mg/dL (ref 0.3–1.2)
Total Protein: 7.4 g/dL (ref 6.5–8.1)

## 2022-01-17 MED ORDER — EPOETIN ALFA-EPBX 40000 UNIT/ML IJ SOLN
40000.0000 [IU] | Freq: Once | INTRAMUSCULAR | Status: AC
  Administered 2022-01-17: 40000 [IU] via SUBCUTANEOUS
  Filled 2022-01-17: qty 1

## 2022-01-17 NOTE — Progress Notes (Signed)
HGB is 10.2. this RN called to verify with the lab. They are having to manually count some of his other labs and have not released them yet.

## 2022-01-23 NOTE — Progress Notes (Unsigned)
Cardiology Office Note:    Date:  01/25/2022   ID:  Ladarian, Bonczek 07/16/1945, MRN [id number]  PCP:  [name], MD  Cardiologist:  None   Referring MD: [name], MD   Chief Complaint  Patient presents with   Advice Only    Dizziness   Irregular Heart Beat    History of Present Illness:    Roy Johnson is a 76 y.o. male with a hx of foggy headedness/dizziness.  Significant medical problems include myelodysplastic syndrome, aortic atherosclerosis, past history of elevated blood pressure, prior smoker, chronic kidney disease, and family history of CAD (father).  The patient has never fainted.  Occasionally, he has palpitations as he did last night.  He has never had sudden tachycardia or lightheadedness or dizziness specifically associated with the palpitations.  Past Medical History:  Diagnosis Date   Anemia    Anesthesia of skin    Calculus of gallbladder with acute cholecystitis, with obstruction    Cellulitis of back    Chest pain    Chronic kidney disease (CKD), stage III (moderate) (HCC)    Colon polyp    Cough    Disorientation    Diverticulitis    Diverticulosis    Dyspnea on exertion    Elevated white blood cell count    Fatigue    Forgetfulness    GERD (gastroesophageal reflux disease)    Hearing loss    HTN (hypertension)    Hydrocele    Hyperkalemia    Hyperlipidemia    Hypertensive chronic kidney disease with stage 1 through stage 4 chronic kidney disease, or unspecified chronic kidney disease    Hypokalemia    IBS (irritable bowel syndrome)    Impacted cerumen, bilateral    Inguinal hernia    Insomnia    Lightheadedness    Liver disease    Lower abdominal pain    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   Mixed stress and urge urinary incontinence    Myelodysplastic syndrome (HCC)    Nausea with vomiting    Numbness and tingling in both hands    Other amnesia    Other benign neoplasm of skin, unspecified    Overflow incontinence of  urine    Pain in right leg    Paresthesia of skin    Prostate cancer (HCC)    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)    Right upper quadrant pain    Sinusitis, acute    SOB (shortness of breath)    Thrombocythemia    Umbilical hernia    Vitamin D deficiency    Weakness     Past Surgical History:  Procedure Laterality Date   COLONOSCOPY     POLYPECTOMY     PROSTATECTOMY     TONSILLECTOMY      Current Medications: Current Meds  Medication Sig   amLODipine (NORVASC) 5 MG tablet Take 5 mg by mouth daily.   anagrelide (AGRYLIN) 1 MG capsule TAKE 1 CAPSULE(1 MG) BY MOUTH DAILY   cetirizine (ZYRTEC) 10 MG tablet TAKE 1 TABLET(10 MG) BY MOUTH DAILY   cholestyramine (QUESTRAN) 4 g packet Take 1 packet (4 g total) by mouth daily.   diphenoxylate-atropine (LOMOTIL) 2.5-0.025 MG tablet Take 1 tablet by mouth 4 (four) times daily as needed.   epoetin alfa-epbx (RETACRIT) 60630 UNIT/ML injection Inject into the skin.   gabapentin (NEURONTIN) 100 MG capsule TAKE 1 CAPSULE BY MOUTH TWICE DAILY AND 2 AT NIGHT   hyoscyamine (LEVSIN SL)  0.125 MG SL tablet DISSOLVE 1 TABLET(0.125 MG) UNDER THE TONGUE EVERY 4 HOURS AS NEEDED   lenalidomide (REVLIMID) 5 MG capsule Take 5 mg by mouth daily.   Multiple Vitamin (MULTI-VITAMINS) TABS Take 1 tablet by mouth daily.   ondansetron (ZOFRAN) 8 MG tablet TAKE 1 TABLET(8 MG) BY MOUTH EVERY 8 HOURS AS NEEDED FOR NAUSEA OR VOMITING   pantoprazole (PROTONIX) 40 MG tablet Take 1 tablet (40 mg total) by mouth daily.   Probiotic Product (ALIGN) 4 MG CAPS Take 1 capsule by mouth daily.   vitamin B-12 (CYANOCOBALAMIN) 1000 MCG tablet Take 1 tablet by mouth daily as needed.     Allergies:   Patient has no known allergies.   Social History   Socioeconomic History   Marital status: Married    Spouse name: Not on file   Number of children: 2   Years of education: college   Highest education level: Not on file  Occupational History    Occupation: retired  Tobacco Use   Smoking status: Former    Types: Cigarettes    Quit date: 09/07/1973    Years since quitting: 48.4   Smokeless tobacco: Never  Vaping Use   Vaping Use: Never used  Substance and Sexual Activity   Alcohol use: Not Currently   Drug use: No   Sexual activity: Not on file  Other Topics Concern   Not on file  Social History Narrative   Lives with wife.   Right-handed.   1 cup caffeine per day.   Social Determinants of Health   Financial Resource Strain: Not on file  Food Insecurity: Not on file  Transportation Needs: Not on file  Physical Activity: Not on file  Stress: Not on file  Social Connections: Not on file     Family History: The patient's family history includes Breast cancer in his mother; Colon polyps in his father; Heart disease in his father; Lung cancer in his father; Osteoarthritis in his mother; Prostate cancer in his father. There is no history of Colon cancer, Esophageal cancer, Rectal cancer, Stomach cancer, or Pancreatic cancer.  ROS:   Please see the history of present illness.    He has urinary incontinence.  He is status post prostatectomy 15 years ago.  He has numbness and tingling in his feet.  He does not sleep well and gets up around 5 times each night because of urinary symptoms.  All other systems reviewed and are negative.  EKGs/Labs/Other Studies Reviewed:    The following studies were reviewed today: No cardiac evaluation.  Pelvic CT with contrast 09/10/2021: IMPRESSION: 1. No acute intra-abdominal process. 2. Unchanged cholelithiasis and splenomegaly. 3. Aortic Atherosclerosis (ICD10-I70.0).  EKG:  EKG normal sinus rhythm with ventricular quadrigeminy.  Otherwise unremarkable.  Recent Labs: 04/07/2021: TSH 2.440 01/24/2022: ALT 12; BUN 30; Creatinine 1.28; Hemoglobin 9.9; Platelet Count 250; Potassium 4.4; Sodium 138  Recent Lipid Panel No results found for: "CHOL", "TRIG", "HDL", "CHOLHDL", "VLDL",  "LDLCALC", "LDLDIRECT"  Physical Exam:    VS:  BP (!) 144/63   Pulse 81   Ht '5\' 6"'$  (1.676 m)   Wt 156 lb 9.6 oz (71 kg)   SpO2 96%   BMI 25.28 kg/m     Wt Readings from Last 3 Encounters:  01/25/22 156 lb 9.6 oz (71 kg)  11/18/21 158 lb 4 oz (71.8 kg)  11/15/21 156 lb (70.8 kg)     GEN: Somewhat ill-appearing skin tone. No acute distress HEENT: Normal NECK: No JVD. LYMPHATICS:  No lymphadenopathy CARDIAC: Soft systolic left lower sternal murmur.  Irregular rhythm has an bigeminy and trigeminy.  No gallop, or edema. VASCULAR:  Normal Pulses. No bruits. RESPIRATORY:  Clear to auscultation without rales, wheezing or rhonchi  ABDOMEN: Soft, non-tender, non-distended, No pulsatile mass, MUSCULOSKELETAL: No deformity  SKIN: Warm and dry NEUROLOGIC:  Alert and oriented x 3 PSYCHIATRIC:  Normal affect   ASSESSMENT:    1. Premature ventricular contractions   2. Concentration deficit   3. Other fatigue   4. Disturbed sleep rhythm   5. Near syncope    PLAN:    In order of problems listed above:  Frequent PVCs.  48-hour monitor and 2D Doppler echocardiogram will be done. He is having difficulty sleeping and I think perhaps the fatigue and decreased concentration could be related to sleep deprivation.  I would recommend a sleep study. See #2 above. He is not having near syncope or fainting spells.   Further work-up will be dependent upon findings.  If he has a high burden of PVCs and certainly if LV function is decreased, some cardiac management may be required.  At this point I believe a sleep study is very important to perform.   Medication Adjustments/Labs and Tests Ordered: Current medicines are reviewed at length with the patient today.  Concerns regarding medicines are outlined above.  Orders Placed This Encounter  Procedures   LONG TERM MONITOR (3-14 DAYS)   EKG 12-Lead   ECHOCARDIOGRAM COMPLETE   No orders of the defined types were placed in this  encounter.   Patient Instructions  Medication Instructions:  Your physician recommends that you continue on your current medications as directed. Please refer to the Current Medication list given to you today.  *If you need a refill on your cardiac medications before your next appointment, please call your pharmacy*  Lab Work: NONE  Testing/Procedures: Your physician has requested that you have an echocardiogram. Echocardiography is a painless test that uses sound waves to create images of your heart. It provides your doctor with information about the size and shape of your heart and how well your heart's chambers and valves are working. This procedure takes approximately one hour. There are no restrictions for this procedure. Please do NOT wear cologne, perfume, aftershave, or lotions (deodorant is allowed). Please arrive 15 minutes prior to your appointment time.  Your physician has requested that you wear a Zio heart monitor for 3 days. This will be mailed to your home with instructions on how to apply the monitor and how to return it when finished. Please allow 2 weeks after returning the heart monitor before our office calls you with the results.  Follow-Up: As needed  Other Instructions ZIO XT- Long Term Monitor Instructions     Dr. Tamala Julian has requested you wear a ZIO patch monitor for 3 days.  This is a single patch monitor. [employer] supplies one patch monitor per enrollment. Additional  stickers are not available. Please do not apply patch if you will be having a Nuclear Stress Test,  Echocardiogram, Cardiac CT, MRI, or Chest Xray during the period you would be wearing the  monitor. The patch cannot be worn during these tests. You cannot remove and re-apply the  ZIO XT patch monitor.  Your ZIO patch monitor will be mailed 3 day USPS to your address on file. It may take 3-5 days  to receive your monitor after you have been enrolled.  Once you have received your monitor, please  review the  enclosed instructions. Your monitor  has already been registered assigning a specific monitor serial # to you.     Billing and Patient Assistance Program Information     We have supplied [employer] with any of your insurance information on file for billing purposes.  [employer] offers a sliding scale Patient Assistance Program for patients that do not have  insurance, or whose insurance does not completely cover the cost of the ZIO monitor.  You must apply for the Patient Assistance Program to qualify for this discounted rate.  To apply, please call [employer] at [phone], select option 4, select option 2, ask to apply for  Patient Assistance Program. Theodore Demark will ask your household income, and how many people  are in your household. They will quote your out-of-pocket cost based on that information.  [employer] will also be able to set up a 19-month interest-free payment plan if needed.     Applying the monitor     Shave hair from upper left chest.  Hold abrader disc by orange tab. Rub abrader in 40 strokes over the upper left chest as  indicated in your monitor instructions.  Clean area with 4 enclosed alcohol pads. Let dry.  Apply patch as indicated in monitor instructions. Patch will be placed under collarbone on left  side of chest with arrow pointing upward.  Rub patch adhesive wings for 2 minutes. Remove white label marked "1". Remove the white  label marked "2". Rub patch adhesive wings for 2 additional minutes.  While looking in a mirror, press and release button in center of patch. A small green light will  flash 3-4 times. This will be your only indicator that the monitor has been turned on.  Do not shower for the first 24 hours. You may shower after the first 24 hours.  Press the button if you feel a symptom. You will hear a small click. Record Date, Time and  Symptom in the Patient Logbook.  When you are ready to remove the patch, follow instructions on the last 2 pages  of Patient  Logbook. Stick patch monitor onto the last page of Patient Logbook.  Place Patient Logbook in the blue and white box. Use locking tab on box and tape box closed  securely. The blue and white box has prepaid postage on it. Please place it in the mailbox as  soon as possible. Your physician should have your test results approximately 7 days after the  monitor has been [facility] to [facility]  Call [facility] [phone] you have questions regarding  your ZIO XT patch monitor. Call them immediately if you see an orange light blinking on your  monitor.  If your monitor falls off in less than 4 days, contact our Monitor department at [phone]  If your monitor becomes loose or falls off after 4 days call [employer] at [phone]  suggestions on securing your monitor.  Important Information About Sugar         Signed, HSinclair Grooms MD  01/25/2022 10:40 AM    [place]

## 2022-01-24 ENCOUNTER — Inpatient Hospital Stay: Payer: Medicare Other

## 2022-01-24 ENCOUNTER — Other Ambulatory Visit: Payer: Self-pay

## 2022-01-24 VITALS — BP 142/72 | HR 72 | Temp 98.2°F | Resp 20

## 2022-01-24 DIAGNOSIS — D469 Myelodysplastic syndrome, unspecified: Secondary | ICD-10-CM

## 2022-01-24 DIAGNOSIS — D461 Refractory anemia with ring sideroblasts: Secondary | ICD-10-CM | POA: Diagnosis not present

## 2022-01-24 LAB — CBC WITH DIFFERENTIAL (CANCER CENTER ONLY)
Abs Immature Granulocytes: 0.37 10*3/uL — ABNORMAL HIGH (ref 0.00–0.07)
Basophils Absolute: 0.2 10*3/uL — ABNORMAL HIGH (ref 0.0–0.1)
Basophils Relative: 1 %
Eosinophils Absolute: 1.3 10*3/uL — ABNORMAL HIGH (ref 0.0–0.5)
Eosinophils Relative: 8 %
HCT: 31.7 % — ABNORMAL LOW (ref 39.0–52.0)
Hemoglobin: 9.9 g/dL — ABNORMAL LOW (ref 13.0–17.0)
Immature Granulocytes: 2 %
Lymphocytes Relative: 13 %
Lymphs Abs: 2.2 10*3/uL (ref 0.7–4.0)
MCH: 25.3 pg — ABNORMAL LOW (ref 26.0–34.0)
MCHC: 31.2 g/dL (ref 30.0–36.0)
MCV: 80.9 fL (ref 80.0–100.0)
Monocytes Absolute: 1.3 10*3/uL — ABNORMAL HIGH (ref 0.1–1.0)
Monocytes Relative: 8 %
Neutro Abs: 11.5 10*3/uL — ABNORMAL HIGH (ref 1.7–7.7)
Neutrophils Relative %: 68 %
Platelet Count: 250 10*3/uL (ref 150–400)
RBC: 3.92 MIL/uL — ABNORMAL LOW (ref 4.22–5.81)
RDW: 35.1 % — ABNORMAL HIGH (ref 11.5–15.5)
WBC Count: 16.8 10*3/uL — ABNORMAL HIGH (ref 4.0–10.5)
nRBC: 0 % (ref 0.0–0.2)

## 2022-01-24 LAB — CMP (CANCER CENTER ONLY)
ALT: 12 U/L (ref 0–44)
AST: 10 U/L — ABNORMAL LOW (ref 15–41)
Albumin: 4.4 g/dL (ref 3.5–5.0)
Alkaline Phosphatase: 60 U/L (ref 38–126)
Anion gap: 6 (ref 5–15)
BUN: 30 mg/dL — ABNORMAL HIGH (ref 8–23)
CO2: 26 mmol/L (ref 22–32)
Calcium: 9.2 mg/dL (ref 8.9–10.3)
Chloride: 106 mmol/L (ref 98–111)
Creatinine: 1.28 mg/dL — ABNORMAL HIGH (ref 0.61–1.24)
GFR, Estimated: 58 mL/min — ABNORMAL LOW (ref >60)
Glucose, Bld: 100 mg/dL — ABNORMAL HIGH (ref 70–99)
Potassium: 4.4 mmol/L (ref 3.5–5.1)
Sodium: 138 mmol/L (ref 135–145)
Total Bilirubin: 0.8 mg/dL (ref 0.3–1.2)
Total Protein: 7.2 g/dL (ref 6.5–8.1)

## 2022-01-24 MED ORDER — EPOETIN ALFA-EPBX 40000 UNIT/ML IJ SOLN
40000.0000 [IU] | Freq: Once | INTRAMUSCULAR | Status: AC
  Administered 2022-01-24: 40000 [IU] via SUBCUTANEOUS
  Filled 2022-01-24: qty 1

## 2022-01-25 ENCOUNTER — Ambulatory Visit: Payer: Medicare Other | Attending: Interventional Cardiology | Admitting: Interventional Cardiology

## 2022-01-25 ENCOUNTER — Ambulatory Visit: Payer: Medicare Other | Attending: Interventional Cardiology

## 2022-01-25 ENCOUNTER — Encounter: Payer: Self-pay | Admitting: Interventional Cardiology

## 2022-01-25 VITALS — BP 144/63 | HR 81 | Ht 66.0 in | Wt 156.6 lb

## 2022-01-25 DIAGNOSIS — R41840 Attention and concentration deficit: Secondary | ICD-10-CM | POA: Diagnosis not present

## 2022-01-25 DIAGNOSIS — R55 Syncope and collapse: Secondary | ICD-10-CM

## 2022-01-25 DIAGNOSIS — G4720 Circadian rhythm sleep disorder, unspecified type: Secondary | ICD-10-CM | POA: Diagnosis not present

## 2022-01-25 DIAGNOSIS — R5383 Other fatigue: Secondary | ICD-10-CM

## 2022-01-25 DIAGNOSIS — I493 Ventricular premature depolarization: Secondary | ICD-10-CM | POA: Diagnosis not present

## 2022-01-25 NOTE — Progress Notes (Unsigned)
Enrolled for [employer] to mail a ZIO XT long term holter monitor to the patients address on file.  

## 2022-01-25 NOTE — Patient Instructions (Addendum)
Medication Instructions:  Your physician recommends that you continue on your current medications as directed. Please refer to the Current Medication list given to you today.  *If you need a refill on your cardiac medications before your next appointment, please call your pharmacy*  Lab Work: NONE  Testing/Procedures: Your physician has requested that you have an echocardiogram. Echocardiography is a painless test that uses sound waves to create images of your heart. It provides your doctor with information about the size and shape of your heart and how well your heart's chambers and valves are working. This procedure takes approximately one hour. There are no restrictions for this procedure. Please do NOT wear cologne, perfume, aftershave, or lotions (deodorant is allowed). Please arrive 15 minutes prior to your appointment time.  Your physician has requested that you wear a Zio heart monitor for 3 days. This will be mailed to your home with instructions on how to apply the monitor and how to return it when finished. Please allow 2 weeks after returning the heart monitor before our office calls you with the results.  Follow-Up: As needed  Other Instructions ZIO XT- Long Term Monitor Instructions     Dr. Tamala Julian has requested you wear a ZIO patch monitor for 3 days.  This is a single patch monitor. [employer] supplies one patch monitor per enrollment. Additional  stickers are not available. Please do not apply patch if you will be having a Nuclear Stress Test,  Echocardiogram, Cardiac CT, MRI, or Chest Xray during the period you would be wearing the  monitor. The patch cannot be worn during these tests. You cannot remove and re-apply the  ZIO XT patch monitor.  Your ZIO patch monitor will be mailed 3 day USPS to your address on file. It may take 3-5 days  to receive your monitor after you have been enrolled.  Once you have received your monitor, please review the enclosed instructions. Your  monitor  has already been registered assigning a specific monitor serial # to you.     Billing and Patient Assistance Program Information     We have supplied [employer] with any of your insurance information on file for billing purposes.  [employer] offers a sliding scale Patient Assistance Program for patients that do not have  insurance, or whose insurance does not completely cover the cost of the ZIO monitor.  You must apply for the Patient Assistance Program to qualify for this discounted rate.  To apply, please call [employer] at [phone], select option 4, select option 2, ask to apply for  Patient Assistance Program. Roy Johnson will ask your household income, and how many people  are in your household. They will quote your out-of-pocket cost based on that information.  [employer] will also be able to set up a 33-month interest-free payment plan if needed.     Applying the monitor     Shave hair from upper left chest.  Hold abrader disc by orange tab. Rub abrader in 40 strokes over the upper left chest as  indicated in your monitor instructions.  Clean area with 4 enclosed alcohol pads. Let dry.  Apply patch as indicated in monitor instructions. Patch will be placed under collarbone on left  side of chest with arrow pointing upward.  Rub patch adhesive wings for 2 minutes. Remove white label marked "1". Remove the white  label marked "2". Rub patch adhesive wings for 2 additional minutes.  While looking in a mirror, press and release button in center of patch.  A small green light will  flash 3-4 times. This will be your only indicator that the monitor has been turned on.  Do not shower for the first 24 hours. You may shower after the first 24 hours.  Press the button if you feel a symptom. You will hear a small click. Record Date, Time and  Symptom in the Patient Logbook.  When you are ready to remove the patch, follow instructions on the last 2 pages of Patient  Logbook. Stick patch  monitor onto the last page of Patient Logbook.  Place Patient Logbook in the blue and white box. Use locking tab on box and tape box closed  securely. The blue and white box has prepaid postage on it. Please place it in the mailbox as  soon as possible. Your physician should have your test results approximately 7 days after the  monitor has been mailed back to [facility].  Call [place] at [phone] if you have questions regarding  your ZIO XT patch monitor. Call them immediately if you see an orange light blinking on your  monitor.  If your monitor falls off in less than 4 days, contact our Monitor department at [phone].  If your monitor becomes loose or falls off after 4 days call [employer] at [phone] for  suggestions on securing your monitor.  Important Information About Sugar

## 2022-01-28 DIAGNOSIS — R55 Syncope and collapse: Secondary | ICD-10-CM | POA: Diagnosis not present

## 2022-01-31 ENCOUNTER — Inpatient Hospital Stay: Payer: Medicare Other

## 2022-01-31 VITALS — BP 124/72 | HR 73 | Temp 98.2°F | Resp 18

## 2022-01-31 DIAGNOSIS — D469 Myelodysplastic syndrome, unspecified: Secondary | ICD-10-CM

## 2022-01-31 DIAGNOSIS — D461 Refractory anemia with ring sideroblasts: Secondary | ICD-10-CM | POA: Diagnosis not present

## 2022-01-31 LAB — CBC WITH DIFFERENTIAL (CANCER CENTER ONLY)
Abs Immature Granulocytes: 0.1 10*3/uL — ABNORMAL HIGH (ref 0.00–0.07)
Basophils Absolute: 0.2 10*3/uL — ABNORMAL HIGH (ref 0.0–0.1)
Basophils Relative: 2 %
Eosinophils Absolute: 0.8 10*3/uL — ABNORMAL HIGH (ref 0.0–0.5)
Eosinophils Relative: 9 %
HCT: 30.6 % — ABNORMAL LOW (ref 39.0–52.0)
Hemoglobin: 9.7 g/dL — ABNORMAL LOW (ref 13.0–17.0)
Immature Granulocytes: 1 %
Lymphocytes Relative: 22 %
Lymphs Abs: 1.9 10*3/uL (ref 0.7–4.0)
MCH: 26.1 pg (ref 26.0–34.0)
MCHC: 31.7 g/dL (ref 30.0–36.0)
MCV: 82.5 fL (ref 80.0–100.0)
Monocytes Absolute: 0.5 10*3/uL (ref 0.1–1.0)
Monocytes Relative: 6 %
Neutro Abs: 5.2 10*3/uL (ref 1.7–7.7)
Neutrophils Relative %: 60 %
Platelet Count: 212 10*3/uL (ref 150–400)
RBC: 3.71 MIL/uL — ABNORMAL LOW (ref 4.22–5.81)
RDW: 34.2 % — ABNORMAL HIGH (ref 11.5–15.5)
WBC Count: 8.8 10*3/uL (ref 4.0–10.5)
nRBC: 0.2 % (ref 0.0–0.2)

## 2022-01-31 LAB — CMP (CANCER CENTER ONLY)
ALT: 18 U/L (ref 0–44)
AST: 11 U/L — ABNORMAL LOW (ref 15–41)
Albumin: 4.2 g/dL (ref 3.5–5.0)
Alkaline Phosphatase: 51 U/L (ref 38–126)
Anion gap: 4 — ABNORMAL LOW (ref 5–15)
BUN: 21 mg/dL (ref 8–23)
CO2: 26 mmol/L (ref 22–32)
Calcium: 9.1 mg/dL (ref 8.9–10.3)
Chloride: 110 mmol/L (ref 98–111)
Creatinine: 1.29 mg/dL — ABNORMAL HIGH (ref 0.61–1.24)
GFR, Estimated: 57 mL/min — ABNORMAL LOW (ref >60)
Glucose, Bld: 99 mg/dL (ref 70–99)
Potassium: 4.6 mmol/L (ref 3.5–5.1)
Sodium: 140 mmol/L (ref 135–145)
Total Bilirubin: 0.7 mg/dL (ref 0.3–1.2)
Total Protein: 7.1 g/dL (ref 6.5–8.1)

## 2022-01-31 MED ORDER — EPOETIN ALFA-EPBX 40000 UNIT/ML IJ SOLN
40000.0000 [IU] | Freq: Once | INTRAMUSCULAR | Status: AC
  Administered 2022-01-31: 40000 [IU] via SUBCUTANEOUS
  Filled 2022-01-31: qty 1

## 2022-02-02 NOTE — Telephone Encounter (Signed)
The pt last saw Dr Henrene Pastor will send to his nurse

## 2022-02-03 ENCOUNTER — Other Ambulatory Visit: Payer: Self-pay | Admitting: Internal Medicine

## 2022-02-03 ENCOUNTER — Other Ambulatory Visit: Payer: Self-pay

## 2022-02-03 DIAGNOSIS — R197 Diarrhea, unspecified: Secondary | ICD-10-CM

## 2022-02-03 MED ORDER — COLESTIPOL HCL 1 G PO TABS: 2.0000 g | ORAL_TABLET | Freq: Two times a day (BID) | ORAL | 1 refills | Status: DC

## 2022-02-04 ENCOUNTER — Other Ambulatory Visit: Payer: Medicare Other

## 2022-02-07 ENCOUNTER — Inpatient Hospital Stay: Payer: Medicare Other

## 2022-02-07 ENCOUNTER — Other Ambulatory Visit: Payer: Self-pay

## 2022-02-07 VITALS — BP 133/71 | HR 74 | Temp 98.0°F | Resp 17

## 2022-02-07 DIAGNOSIS — D469 Myelodysplastic syndrome, unspecified: Secondary | ICD-10-CM

## 2022-02-07 DIAGNOSIS — D461 Refractory anemia with ring sideroblasts: Secondary | ICD-10-CM | POA: Diagnosis not present

## 2022-02-07 LAB — CMP (CANCER CENTER ONLY)
ALT: 15 U/L (ref 0–44)
AST: 10 U/L — ABNORMAL LOW (ref 15–41)
Albumin: 4.2 g/dL (ref 3.5–5.0)
Alkaline Phosphatase: 59 U/L (ref 38–126)
Anion gap: 5 (ref 5–15)
BUN: 22 mg/dL (ref 8–23)
CO2: 28 mmol/L (ref 22–32)
Calcium: 9.1 mg/dL (ref 8.9–10.3)
Chloride: 108 mmol/L (ref 98–111)
Creatinine: 1.3 mg/dL — ABNORMAL HIGH (ref 0.61–1.24)
GFR, Estimated: 57 mL/min — ABNORMAL LOW (ref >60)
Glucose, Bld: 97 mg/dL (ref 70–99)
Potassium: 4.6 mmol/L (ref 3.5–5.1)
Sodium: 141 mmol/L (ref 135–145)
Total Bilirubin: 0.7 mg/dL (ref 0.3–1.2)
Total Protein: 7.3 g/dL (ref 6.5–8.1)

## 2022-02-07 LAB — CBC WITH DIFFERENTIAL (CANCER CENTER ONLY)
Abs Immature Granulocytes: 0.4 10*3/uL — ABNORMAL HIGH (ref 0.00–0.07)
Basophils Absolute: 0.2 10*3/uL — ABNORMAL HIGH (ref 0.0–0.1)
Basophils Relative: 2 %
Eosinophils Absolute: 0.4 10*3/uL (ref 0.0–0.5)
Eosinophils Relative: 5 %
HCT: 29 % — ABNORMAL LOW (ref 39.0–52.0)
Hemoglobin: 9.2 g/dL — ABNORMAL LOW (ref 13.0–17.0)
Immature Granulocytes: 4 %
Lymphocytes Relative: 20 %
Lymphs Abs: 1.9 10*3/uL (ref 0.7–4.0)
MCH: 26.1 pg (ref 26.0–34.0)
MCHC: 31.7 g/dL (ref 30.0–36.0)
MCV: 82.4 fL (ref 80.0–100.0)
Monocytes Absolute: 0.7 10*3/uL (ref 0.1–1.0)
Monocytes Relative: 7 %
Neutro Abs: 5.8 10*3/uL (ref 1.7–7.7)
Neutrophils Relative %: 62 %
Platelet Count: 250 10*3/uL (ref 150–400)
RBC: 3.52 MIL/uL — ABNORMAL LOW (ref 4.22–5.81)
RDW: 34.2 % — ABNORMAL HIGH (ref 11.5–15.5)
WBC Count: 9.4 10*3/uL (ref 4.0–10.5)
nRBC: 0.4 % — ABNORMAL HIGH (ref 0.0–0.2)

## 2022-02-07 MED ORDER — EPOETIN ALFA-EPBX 40000 UNIT/ML IJ SOLN
40000.0000 [IU] | Freq: Once | INTRAMUSCULAR | Status: AC
  Administered 2022-02-07: 40000 [IU] via SUBCUTANEOUS

## 2022-02-07 NOTE — Progress Notes (Signed)
Per lab, hgb 9.2, platelets still pending

## 2022-02-07 NOTE — Patient Instructions (Signed)

## 2022-02-08 ENCOUNTER — Encounter: Payer: Self-pay | Admitting: Neurology

## 2022-02-08 ENCOUNTER — Ambulatory Visit: Payer: Medicare Other | Admitting: Neurology

## 2022-02-08 ENCOUNTER — Other Ambulatory Visit: Payer: Medicare Other

## 2022-02-08 ENCOUNTER — Telehealth: Payer: Self-pay | Admitting: Neurology

## 2022-02-08 VITALS — BP 146/76 | HR 81 | Ht 67.0 in | Wt 158.5 lb

## 2022-02-08 DIAGNOSIS — R3915 Urgency of urination: Secondary | ICD-10-CM

## 2022-02-08 DIAGNOSIS — G4710 Hypersomnia, unspecified: Secondary | ICD-10-CM

## 2022-02-08 DIAGNOSIS — R413 Other amnesia: Secondary | ICD-10-CM | POA: Diagnosis not present

## 2022-02-08 DIAGNOSIS — R197 Diarrhea, unspecified: Secondary | ICD-10-CM

## 2022-02-08 NOTE — Progress Notes (Signed)
Chief Complaint  Patient presents with   New Patient (Initial Visit)    Rm 14. Alone. NX [facility] (Sleep)/Sanai Frick 2022/Paper proficient referral for Chronic insomnia with forgetfullness and memory concerns/DANIEL PATERSON.   ASSESSMENT AND PLAN  Roy Johnson is a 76 y.o. male   Mild cognitive impairment  Mini-Mental Status Examination today 28/30  MRI of the brain November 2022 showed small vessel disease no acute abnormality  Laboratory evaluation showed no treatable etiology  Continue moderate exercise Right foot paresthesia, right lumbar radiculopathy,  EMG nerve conduction study in January 2022 showed no large fiber peripheral neuropathy,  He is under the care of orthopedics/pain management, had MRI of lumbar spine from outside facility, some improvement with epidural injection Mild unsteadiness, worsening urinary urgency  Hyperreflexia of bilateral upper extremity patella, bilateral Babinski signs  MRI of cervical spine to rule out cervical spondylitic myelopathy Excessive daytime excessive sleepiness, fatigue,  Referred for sleep study  Return To Clinic With NP In 6 Months   DIAGNOSTIC DATA (LABS, IMAGING, TESTING) - I reviewed patient records, labs, notes, testing and imaging myself where available.   MEDICAL HISTORY:  Roy Johnson is a 76 year old male, his primary care physician is Dr. Philip Aspen, [name], return to follow-up for cognitive impairment, new onset left visual loss in June 2022  I reviewed and summarized the referring note. PMHx. 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 Earlie Server,  He was a patient of Dr. Jannifer Franklin in the past, saw him in December 2021, he retired from Radiation protection practitioner job, Proofreader business, Teacher, English as a foreign language, he was in Development worker, community, marketing reported gradual onset mild memory loss over the past 2  years, word finding difficulties, short-term memory loss, slower reading and understanding complicated information presented.  Still functioning daily activity, driving without difficulty, able to keep his checkbook imbalance, today's Mini-Mental status 29/30  I personally reviewed MRI of the brain in 2019 which showed no significant abnormalities. Laboratory evaluation showed normal CMP, TSH, B12,  Today his main concern is 1 episode of sudden onset left visual loss in June 2022, lasting for 30 seconds, like a thick mesh covered his left eye, no headache, no lateralized motor or sensory deficit.  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 Felecia Shelling January 2022, no evidence of polyneuropathy, no evidence of active lumbar radiculopathy, mild right carpal tunnel  UPDATE Apr 07 2021: Is overall doing very well, no significant changes since last visit 6 months ago, no longer has sudden visual loss, complains of right lumbar radicular pain, mild gait difficulty due to that  We personally reviewed MRI of brain in November 2022, age-appropriate small vessel disease, no acute abnormality MRA of head and neck showed no large vessel disease,  He continues to complain of occasionally episodes of overwhelming sleepiness, fatigue episode, also complains of mild memory loss,  UPDATE Feb 08 2022: He continue complains of short term memory loss, word finding difficulties, which is a significant change  compared to previous baseline, MOCA  examination 28/80  today,  He denies loud snoring, but he tends to sleep on his stomach, complains of daytime sleepiness, required nap often, he also has myelodysplastic syndrome, might contributed to his complaints of fatigue  He is also concerned about mild unsteadiness, has not been physically active, recovered from previous episode of low back pain, right lower extremity radiating pain, he is also concerned about his slow worsening urinary urgency, occasionally accident has to wear pads now, in summer 2023, he suffered frequent diarrhea, stool pathology was positive for C. difficile, norovirus was treated with vancomycin for 2 weeks, repeat C. difficile testing was negative, irritable bowel syndrome, followed by [facility] Dr. Scarlette Shorts,  Personally reviewed MRI of the brain without contrast November 2022, mild small vessel disease, age-appropriate atrophy  MRA of brain and neck showed no large vessel disease  He is a caregiver of his wife who suffered Parkinson's disease, this made him very concerned about his health issues to make sure he can take care of his wife  PHYSICAL EXAM:   Vitals:   02/08/22 1127  BP: (!) 146/76  Pulse: 81  Weight: 158 lb 8 oz (71.9 kg)  Height: '5\' 7"'$  (1.702 m)   Body mass index is 24.82 kg/m.  PHYSICAL EXAMNIATION:  Gen: NAD, conversant, well nourised, well groomed                     Cardiovascular: Regular rate rhythm, no peripheral edema, warm, nontender. Eyes: Conjunctivae clear without exudates or hemorrhage Neck: Supple, no carotid bruits. Pulmonary: Clear to auscultation bilaterally   NEUROLOGICAL EXAM:  MENTAL STATUS: Speech:    Speech is normal; fluent and spontaneous with normal comprehension.  Cognition:    02/08/2022   11:29 AM 04/07/2021    4:00 PM  Montreal Cognitive Assessment   Visuospatial/ Executive (0/5) 5 5  Naming (0/3) 3 3  Attention: Read list of digits (0/2) 2 2  Attention: Read list of letters (0/1) 1 1  Attention: Serial 7  subtraction starting at 100 (0/3) 3 3  Language: Repeat phrase (0/2) 2 2  Language : Fluency (0/1) 1 1  Abstraction (0/2) 2 2  Delayed Recall (0/5) 3 4  Orientation (0/6) 6 6  Total 28 29  Adjusted Score (based on education) 28       CRANIAL NERVES: CN II: Visual fields are full to confrontation. Pupils are round equal and briskly reactive to light. CN III, IV, VI: extraocular movement are normal. No ptosis. CN V: Facial sensation is intact to light touch CN VII: Face is symmetric with normal eye closure  CN VIII: Hearing is normal to causal conversation. CN IX, X: Phonation is normal. CN XI: Head turning and shoulder shrug are intact  MOTOR: There is no pronator drift of out-stretched arms. Muscle bulk and tone are normal. Muscle strength is normal.  REFLEXES: Reflexes are 2+ and symmetric at the biceps, triceps, 3/3 knees, and absent at ankles. Plantar responses are extensor bilaterally SENSORY: Decreased to right toe vibratory sensation light touch,  COORDINATION: There is no trunk or limb dysmetria noted.  GAIT/STANCE: Can get up from seated position arm crossed, steady, mild difficulty perform tiptoe and tandem walking  REVIEW OF SYSTEMS:  Full 14 system review of systems performed and notable only for as above All other review of systems were negative.   ALLERGIES: No Known Allergies  HOME MEDICATIONS: Current Outpatient Medications  Medication Sig Dispense Refill  amLODipine (NORVASC) 5 MG tablet Take 5 mg by mouth daily.  2   anagrelide (AGRYLIN) 1 MG capsule TAKE 1 CAPSULE(1 MG) BY MOUTH DAILY 90 capsule 0   cetirizine (ZYRTEC) 10 MG tablet TAKE 1 TABLET(10 MG) BY MOUTH DAILY     cholestyramine (QUESTRAN) 4 g packet Take 1 packet (4 g total) by mouth daily. 15 each 0   colestipol (COLESTID) 1 g tablet TAKE 2 TABLETS(2 GRAMS) BY MOUTH TWICE DAILY 360 tablet 3   diphenoxylate-atropine (LOMOTIL) 2.5-0.025 MG tablet Take 1 tablet by mouth 4 (four) times daily as  needed.     epoetin alfa-epbx (RETACRIT) 52841 UNIT/ML injection Inject into the skin.     gabapentin (NEURONTIN) 100 MG capsule TAKE 1 CAPSULE BY MOUTH TWICE DAILY AND 2 AT NIGHT 360 capsule 1   hyoscyamine (LEVSIN SL) 0.125 MG SL tablet DISSOLVE 1 TABLET(0.125 MG) UNDER THE TONGUE EVERY 4 HOURS AS NEEDED 90 tablet 3   lenalidomide (REVLIMID) 5 MG capsule Take 5 mg by mouth daily.     Multiple Vitamin (MULTI-VITAMINS) TABS Take 1 tablet by mouth daily.     ondansetron (ZOFRAN) 8 MG tablet TAKE 1 TABLET(8 MG) BY MOUTH EVERY 8 HOURS AS NEEDED FOR NAUSEA OR VOMITING 20 tablet 0   pantoprazole (PROTONIX) 40 MG tablet Take 1 tablet (40 mg total) by mouth daily. 90 tablet 3   Probiotic Product (ALIGN) 4 MG CAPS Take 1 capsule by mouth daily.     vitamin B-12 (CYANOCOBALAMIN) 1000 MCG tablet Take 1 tablet by mouth daily as needed.     No current facility-administered medications for this visit.   Facility-Administered Medications Ordered in Other Visits  Medication Dose Route Frequency Provider Last Rate Last Admin   0.9 %  sodium chloride infusion  250 mL Intravenous Once Curt Bears, MD        PAST MEDICAL HISTORY: Past Medical History:  Diagnosis Date   Anemia    Anesthesia of skin    Calculus of gallbladder with acute cholecystitis, with obstruction    Cellulitis of back    Chest pain    Chronic kidney disease (CKD), stage III (moderate) (HCC)    Colon polyp    Cough    Disorientation    Diverticulitis    Diverticulosis    Dyspnea on exertion    Elevated white blood cell count    Fatigue    Forgetfulness    GERD (gastroesophageal reflux disease)    Hearing loss    HTN (hypertension)    Hydrocele    Hyperkalemia    Hyperlipidemia    Hypertensive chronic kidney disease with stage 1 through stage 4 chronic kidney disease, or unspecified chronic kidney disease    Hypokalemia    IBS (irritable bowel syndrome)    Impacted cerumen, bilateral    Inguinal hernia    Insomnia     Lightheadedness    Liver disease    Lower abdominal pain    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   Mixed stress and urge urinary incontinence    Myelodysplastic syndrome (HCC)    Nausea with vomiting    Numbness and tingling in both hands    Other amnesia    Other benign neoplasm of skin, unspecified    Overflow incontinence of urine    Pain in right leg    Paresthesia of skin    Prostate cancer ([place])    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)    Right upper quadrant pain  Sinusitis, acute    SOB (shortness of breath)    Thrombocythemia    Umbilical hernia    Vitamin D deficiency    Weakness     PAST SURGICAL HISTORY: Past Surgical History:  Procedure Laterality Date   COLONOSCOPY     POLYPECTOMY     PROSTATECTOMY     TONSILLECTOMY      FAMILY HISTORY: Family History  Problem Relation Age of Onset   Osteoarthritis Mother    Breast cancer Mother    Heart disease Father    Colon polyps Father    Lung cancer Father        cancerous mole   Prostate cancer Father    Colon cancer Neg Hx    Esophageal cancer Neg Hx    Rectal cancer Neg Hx    Stomach cancer Neg Hx    Pancreatic cancer Neg Hx     SOCIAL HISTORY: Social History   Socioeconomic History   Marital status: Married    Spouse name: Not on file   Number of children: 2   Years of education: college   Highest education level: Not on file  Occupational History   Occupation: retired  Tobacco Use   Smoking status: Former    Types: Cigarettes    Quit date: 09/07/1973    Years since quitting: 48.4   Smokeless tobacco: Never  Vaping Use   Vaping Use: Never used  Substance and Sexual Activity   Alcohol use: Not Currently   Drug use: No   Sexual activity: Not on file  Other Topics Concern   Not on file  Social History Narrative   Lives with wife.   Right-handed.   1 cup caffeine per day.   Social Determinants of Health   Financial Resource Strain: Not on file  Food Insecurity: Not on  file  Transportation Needs: Not on file  Physical Activity: Not on file  Stress: Not on file  Social Connections: Not on file  Intimate Partner Violence: Not on file    Total time spent reviewing the chart, obtaining history, examined patient, ordering tests, documentation, consultations and family, care coordination was 59 minutes     Marcial Pacas, M.D. Ph.D.  [facility] excessive daytime sleepiness [address], [place], [place] [address] Ph: [phone] Fax: [phone]  CC:  [name], MD [address] [place],  [place] [address]  [name], MD

## 2022-02-08 NOTE — Telephone Encounter (Signed)
[employer] medicare NPR sent to GI [phone] 

## 2022-02-09 ENCOUNTER — Ambulatory Visit (HOSPITAL_COMMUNITY): Payer: Medicare Other | Attending: Cardiovascular Disease

## 2022-02-09 DIAGNOSIS — R55 Syncope and collapse: Secondary | ICD-10-CM | POA: Insufficient documentation

## 2022-02-09 LAB — ECHOCARDIOGRAM COMPLETE
Area-P 1/2: 3.27 cm2
S' Lateral: 3.1 cm

## 2022-02-11 LAB — CLOSTRIDIUM DIFFICILE BY PCR: Toxigenic C. Difficile by PCR: NEGATIVE

## 2022-02-14 ENCOUNTER — Other Ambulatory Visit: Payer: Self-pay

## 2022-02-14 ENCOUNTER — Inpatient Hospital Stay: Payer: Medicare Other | Admitting: Internal Medicine

## 2022-02-14 ENCOUNTER — Inpatient Hospital Stay: Payer: Medicare Other | Attending: Internal Medicine

## 2022-02-14 ENCOUNTER — Inpatient Hospital Stay: Payer: Medicare Other

## 2022-02-14 ENCOUNTER — Encounter: Payer: Self-pay | Admitting: Internal Medicine

## 2022-02-14 VITALS — BP 134/69 | HR 73 | Temp 97.7°F | Wt 155.6 lb

## 2022-02-14 DIAGNOSIS — T451X5A Adverse effect of antineoplastic and immunosuppressive drugs, initial encounter: Secondary | ICD-10-CM

## 2022-02-14 DIAGNOSIS — D72829 Elevated white blood cell count, unspecified: Secondary | ICD-10-CM | POA: Diagnosis not present

## 2022-02-14 DIAGNOSIS — G629 Polyneuropathy, unspecified: Secondary | ICD-10-CM | POA: Insufficient documentation

## 2022-02-14 DIAGNOSIS — Z7961 Long term (current) use of immunomodulator: Secondary | ICD-10-CM | POA: Insufficient documentation

## 2022-02-14 DIAGNOSIS — D473 Essential (hemorrhagic) thrombocythemia: Secondary | ICD-10-CM

## 2022-02-14 DIAGNOSIS — D6481 Anemia due to antineoplastic chemotherapy: Secondary | ICD-10-CM

## 2022-02-14 DIAGNOSIS — K219 Gastro-esophageal reflux disease without esophagitis: Secondary | ICD-10-CM | POA: Insufficient documentation

## 2022-02-14 DIAGNOSIS — Z86018 Personal history of other benign neoplasm: Secondary | ICD-10-CM | POA: Insufficient documentation

## 2022-02-14 DIAGNOSIS — Z8719 Personal history of other diseases of the digestive system: Secondary | ICD-10-CM | POA: Insufficient documentation

## 2022-02-14 DIAGNOSIS — R5383 Other fatigue: Secondary | ICD-10-CM | POA: Insufficient documentation

## 2022-02-14 DIAGNOSIS — Z79899 Other long term (current) drug therapy: Secondary | ICD-10-CM | POA: Diagnosis not present

## 2022-02-14 DIAGNOSIS — Z8546 Personal history of malignant neoplasm of prostate: Secondary | ICD-10-CM | POA: Diagnosis not present

## 2022-02-14 DIAGNOSIS — D461 Refractory anemia with ring sideroblasts: Secondary | ICD-10-CM | POA: Insufficient documentation

## 2022-02-14 DIAGNOSIS — Z9079 Acquired absence of other genital organ(s): Secondary | ICD-10-CM | POA: Diagnosis not present

## 2022-02-14 DIAGNOSIS — D469 Myelodysplastic syndrome, unspecified: Secondary | ICD-10-CM

## 2022-02-14 LAB — CMP (CANCER CENTER ONLY)
ALT: 12 U/L (ref 0–44)
AST: 9 U/L — ABNORMAL LOW (ref 15–41)
Albumin: 4.3 g/dL (ref 3.5–5.0)
Alkaline Phosphatase: 53 U/L (ref 38–126)
Anion gap: 6 (ref 5–15)
BUN: 21 mg/dL (ref 8–23)
CO2: 26 mmol/L (ref 22–32)
Calcium: 9 mg/dL (ref 8.9–10.3)
Chloride: 109 mmol/L (ref 98–111)
Creatinine: 1.2 mg/dL (ref 0.61–1.24)
GFR, Estimated: >60 mL/min (ref >60)
Glucose, Bld: 92 mg/dL (ref 70–99)
Potassium: 4 mmol/L (ref 3.5–5.1)
Sodium: 141 mmol/L (ref 135–145)
Total Bilirubin: 0.9 mg/dL (ref 0.3–1.2)
Total Protein: 7.1 g/dL (ref 6.5–8.1)

## 2022-02-14 LAB — CBC WITH DIFFERENTIAL (CANCER CENTER ONLY)
Abs Immature Granulocytes: 1.65 10*3/uL — ABNORMAL HIGH (ref 0.00–0.07)
Basophils Absolute: 0.2 10*3/uL — ABNORMAL HIGH (ref 0.0–0.1)
Basophils Relative: 1 %
Eosinophils Absolute: 0.8 10*3/uL — ABNORMAL HIGH (ref 0.0–0.5)
Eosinophils Relative: 5 %
HCT: 31.1 % — ABNORMAL LOW (ref 39.0–52.0)
Hemoglobin: 9.8 g/dL — ABNORMAL LOW (ref 13.0–17.0)
Immature Granulocytes: 10 %
Lymphocytes Relative: 13 %
Lymphs Abs: 2.3 10*3/uL (ref 0.7–4.0)
MCH: 26 pg (ref 26.0–34.0)
MCHC: 31.5 g/dL (ref 30.0–36.0)
MCV: 82.5 fL (ref 80.0–100.0)
Monocytes Absolute: 0.5 10*3/uL (ref 0.1–1.0)
Monocytes Relative: 3 %
Neutro Abs: 11.6 10*3/uL — ABNORMAL HIGH (ref 1.7–7.7)
Neutrophils Relative %: 68 %
Platelet Count: 277 10*3/uL (ref 150–400)
RBC: 3.77 MIL/uL — ABNORMAL LOW (ref 4.22–5.81)
RDW: 34.2 % — ABNORMAL HIGH (ref 11.5–15.5)
WBC Count: 17 10*3/uL — ABNORMAL HIGH (ref 4.0–10.5)
nRBC: 0.3 % — ABNORMAL HIGH (ref 0.0–0.2)

## 2022-02-14 LAB — IRON AND IRON BINDING CAPACITY (CC-WL,HP ONLY)
Iron: 70 ug/dL (ref 45–182)
Saturation Ratios: 29 % (ref 17.9–39.5)
TIBC: 245 ug/dL — ABNORMAL LOW (ref 250–450)
UIBC: 175 ug/dL (ref 117–376)

## 2022-02-14 LAB — FERRITIN: Ferritin: 383 ng/mL — ABNORMAL HIGH (ref 24–336)

## 2022-02-14 LAB — LACTATE DEHYDROGENASE: LDH: 337 U/L — ABNORMAL HIGH (ref 98–192)

## 2022-02-14 MED ORDER — EPOETIN ALFA-EPBX 40000 UNIT/ML IJ SOLN
40000.0000 [IU] | Freq: Once | INTRAMUSCULAR | Status: AC
  Administered 2022-02-14: 40000 [IU] via SUBCUTANEOUS
  Filled 2022-02-14: qty 1

## 2022-02-14 NOTE — Progress Notes (Signed)
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   PRIOR THERAPY: Previous treatment with combination of Hydrea and anagrelide.  CURRENT THERAPY:  1) Revlimid 5 mg by mouth daily 2 weeks on and 2 weeks off.  Started 03/25/2016.  Managed by Dr. Evelene Croon at [facility]. 2) anagrelide 0.5 mg p.o. twice daily. 3) Aranesp 300 mcg subcutaneously every 3 weeks.  This is switched to Procrit 400 mcg subcutaneously every 2 weeks starting April 13, 2017.  The frequency of the Procrit was changed to weekly schedule when he was in [place].  He is currently on Retacrit weekly as a biosimilar to Procrit.  INTERVAL HISTORY: BRAEDYN KAUK 76 y.o. male returns to the clinic today for follow-up visit.  The patient is feeling fine today with no concerning complaints except for the generalized aching pain and fatigue as well as peripheral neuropathy.  He denied having any current chest pain, shortness of breath, cough or hemoptysis.  He has no nausea, vomiting but has occasional diarrhea with no constipation.  He has no headache or visual changes.  He continues to tolerate his treatment with Revlimid, anagrelide and Retacrit fairly well.  He is here today for evaluation and repeat blood work.  MEDICAL HISTORY: Past Medical History:  Diagnosis Date   Anemia    Anesthesia of skin    Calculus of gallbladder with acute cholecystitis, with obstruction    Cellulitis of back    Chest pain    Chronic kidney disease (CKD), stage III (moderate) (HCC)    Colon polyp    Cough    Disorientation    Diverticulitis    Diverticulosis    Dyspnea on exertion    Elevated white blood cell count    Fatigue     Forgetfulness    GERD (gastroesophageal reflux disease)    Hearing loss    HTN (hypertension)    Hydrocele    Hyperkalemia    Hyperlipidemia    Hypertensive chronic kidney disease with stage 1 through stage 4 chronic kidney disease, or unspecified chronic kidney disease    Hypokalemia    IBS (irritable bowel syndrome)    Impacted cerumen, bilateral    Inguinal hernia    Insomnia    Lightheadedness    Liver disease    Lower abdominal pain    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  MEDICATIONS:  Current Outpatient Medications  Medication Sig Dispense Refill   amLODipine (NORVASC) 5 MG tablet Take 5 mg by mouth daily.  2   anagrelide (AGRYLIN) 1 MG  capsule TAKE 1 CAPSULE(1 MG) BY MOUTH DAILY 90 capsule 0   cetirizine (ZYRTEC) 10 MG tablet TAKE 1 TABLET(10 MG) BY MOUTH DAILY     cholestyramine (QUESTRAN) 4 g packet Take 1 packet (4 g total) by mouth daily. 15 each 0   colestipol (COLESTID) 1 g tablet TAKE 2 TABLETS(2 GRAMS) BY MOUTH TWICE DAILY 360 tablet 3   diphenoxylate-atropine (LOMOTIL) 2.5-0.025 MG tablet Take 1 tablet by mouth 4 (four) times daily as needed.     epoetin alfa-epbx (RETACRIT) 75643 UNIT/ML injection Inject into the skin.     gabapentin (NEURONTIN) 100 MG capsule TAKE 1 CAPSULE BY MOUTH TWICE DAILY AND 2 AT NIGHT 360 capsule 1   hyoscyamine (LEVSIN SL) 0.125 MG SL tablet DISSOLVE 1 TABLET(0.125 MG) UNDER THE  TONGUE EVERY 4 HOURS AS NEEDED 90 tablet 3   lenalidomide (REVLIMID) 5 MG capsule Take 5 mg by mouth daily.     Multiple Vitamin (MULTI-VITAMINS) TABS Take 1 tablet by mouth daily.     ondansetron (ZOFRAN) 8 MG tablet TAKE 1 TABLET(8 MG) BY MOUTH EVERY 8 HOURS AS NEEDED FOR NAUSEA OR VOMITING 20 tablet 0   pantoprazole (PROTONIX) 40 MG tablet Take 1 tablet (40 mg total) by mouth daily. 90 tablet 3   Probiotic Product (ALIGN) 4 MG CAPS Take 1 capsule by mouth daily.     vitamin B-12 (CYANOCOBALAMIN) 1000 MCG tablet Take 1 tablet by mouth daily as needed.     No current facility-administered medications for this visit.   Facility-Administered Medications Ordered in Other Visits  Medication Dose Route Frequency Provider Last Rate Last Admin   0.9 %  sodium chloride infusion  250 mL Intravenous Once Curt Bears, MD        SURGICAL HISTORY:  Past Surgical History:  Procedure Laterality Date   COLONOSCOPY     POLYPECTOMY     PROSTATECTOMY     TONSILLECTOMY      REVIEW OF SYSTEMS:  A comprehensive review of systems was negative except for: Constitutional: positive for fatigue Gastrointestinal: positive for abdominal pain and diarrhea Musculoskeletal: positive for arthralgias Neurological: positive for paresthesia   PHYSICAL EXAMINATION: General appearance: alert, cooperative, appears stated age, fatigued, and no distress Head: Normocephalic, without obvious abnormality, atraumatic Neck: no adenopathy, no JVD, supple, symmetrical, trachea midline, and thyroid not enlarged, symmetric, no tenderness/mass/nodules Lymph nodes: Cervical, supraclavicular, and axillary nodes normal. Resp: clear to auscultation bilaterally Back: symmetric, no curvature. ROM normal. No CVA tenderness. Cardio: regular rate and rhythm, S1, S2 normal, no murmur, click, rub or gallop GI: soft, non-tender; bowel sounds normal; no masses,  no organomegaly Extremities: extremities normal, atraumatic, no cyanosis or  edema  ECOG PERFORMANCE STATUS: 1 - Symptomatic but completely ambulatory  Blood pressure 134/69, pulse 73, temperature 97.7 F (36.5 C), temperature source Tympanic, weight 155 lb 9.6 oz (70.6 kg), SpO2 99 %.  LABORATORY DATA:  Lab Results  Component Value Date   WBC 9.4 02/07/2022   HGB 9.2 (L) 02/07/2022   HCT 29.0 (L) 02/07/2022   MCV 82.4 02/07/2022   PLT 250 02/07/2022      Chemistry      Component Value Date/Time   NA 141 02/07/2022 0856   NA 141 12/08/2016 0928   K 4.6 02/07/2022 0856   K 4.5 12/08/2016 0928   CL 108 02/07/2022 0856   CO2 28 02/07/2022 0856   CO2 27 12/08/2016 0928   BUN 22 02/07/2022 0856   BUN 14.1 12/08/2016 0928  CREATININE 1.30 (H) 02/07/2022 0856   CREATININE 0.9 12/08/2016 0928      Component Value Date/Time   CALCIUM 9.1 02/07/2022 0856   CALCIUM 9.0 12/08/2016 0928   ALKPHOS 59 02/07/2022 0856   ALKPHOS 87 12/08/2016 0928   AST 10 (L) 02/07/2022 0856   AST 25 12/08/2016 0928   ALT 15 02/07/2022 0856   ALT 41 12/08/2016 0928   BILITOT 0.7 02/07/2022 0856   BILITOT 0.84 12/08/2016 0928      RADIOGRAPHIC STUDIES:  ASSESSMENT AND PLAN:  This is a very pleasant 76 years old white male with essential thrombocythemia with positive JAK-2 mutation as well as myelodysplastic syndrome. He is currently on treatment with Revlimid 5 mg by mouth daily according to the recommendation from [facility].  He is also on treatment with Aranesp every 3 weeks.  Aranesp was a change it to Procrit initially every 2 weeks and currently on weekly basis.  He mentioned that he is feeling much better on Procrit weekly  The patient has been on treatment with Retacrit 40,000 unit weekly for more than 3 years.  The patient has been tolerating this treatment well with no concerning adverse effects. I recommended for him to continue his injection as planned. He is traveling to [place] the first week of January 2024 and he usually stays there for around 6  months. He will have follow-up visit and injection in [place] after he comes back from [place].  He will call for the appointment. He also has an appointment with Dr. Evelene Croon at [facility] for evaluation and follow-up visit. The patient was advised to call immediately if he has any other concerning symptoms in the interval.  All questions were answered. The patient knows to call the clinic with any problems, questions or concerns. We can certainly see the patient much sooner if necessary.   Disclaimer: This note was dictated with voice recognition software. Similar sounding words can inadvertently be transcribed and may not be corrected upon review.

## 2022-02-21 ENCOUNTER — Inpatient Hospital Stay: Payer: Medicare Other

## 2022-02-21 ENCOUNTER — Telehealth: Payer: Self-pay | Admitting: Internal Medicine

## 2022-02-21 VITALS — BP 143/68 | HR 72 | Temp 98.1°F | Resp 18

## 2022-02-21 DIAGNOSIS — D469 Myelodysplastic syndrome, unspecified: Secondary | ICD-10-CM

## 2022-02-21 DIAGNOSIS — D461 Refractory anemia with ring sideroblasts: Secondary | ICD-10-CM | POA: Diagnosis not present

## 2022-02-21 LAB — CBC WITH DIFFERENTIAL (CANCER CENTER ONLY)
Abs Immature Granulocytes: 0.79 10*3/uL — ABNORMAL HIGH (ref 0.00–0.07)
Basophils Absolute: 0.2 10*3/uL — ABNORMAL HIGH (ref 0.0–0.1)
Basophils Relative: 1 %
Eosinophils Absolute: 1.5 10*3/uL — ABNORMAL HIGH (ref 0.0–0.5)
Eosinophils Relative: 9 %
HCT: 33.3 % — ABNORMAL LOW (ref 39.0–52.0)
Hemoglobin: 10.4 g/dL — ABNORMAL LOW (ref 13.0–17.0)
Immature Granulocytes: 5 %
Lymphocytes Relative: 15 %
Lymphs Abs: 2.6 10*3/uL (ref 0.7–4.0)
MCH: 25.4 pg — ABNORMAL LOW (ref 26.0–34.0)
MCHC: 31.2 g/dL (ref 30.0–36.0)
MCV: 81.4 fL (ref 80.0–100.0)
Monocytes Absolute: 1.3 10*3/uL — ABNORMAL HIGH (ref 0.1–1.0)
Monocytes Relative: 8 %
Neutro Abs: 10.5 10*3/uL — ABNORMAL HIGH (ref 1.7–7.7)
Neutrophils Relative %: 62 %
Platelet Count: 277 10*3/uL (ref 150–400)
RBC: 4.09 MIL/uL — ABNORMAL LOW (ref 4.22–5.81)
RDW: 35.5 % — ABNORMAL HIGH (ref 11.5–15.5)
WBC Count: 16.9 10*3/uL — ABNORMAL HIGH (ref 4.0–10.5)
nRBC: 0.1 % (ref 0.0–0.2)

## 2022-02-21 LAB — CMP (CANCER CENTER ONLY)
ALT: 19 U/L (ref 0–44)
AST: 13 U/L — ABNORMAL LOW (ref 15–41)
Albumin: 4.7 g/dL (ref 3.5–5.0)
Alkaline Phosphatase: 54 U/L (ref 38–126)
Anion gap: 6 (ref 5–15)
BUN: 21 mg/dL (ref 8–23)
CO2: 27 mmol/L (ref 22–32)
Calcium: 9.4 mg/dL (ref 8.9–10.3)
Chloride: 107 mmol/L (ref 98–111)
Creatinine: 1.38 mg/dL — ABNORMAL HIGH (ref 0.61–1.24)
GFR, Estimated: 53 mL/min — ABNORMAL LOW (ref >60)
Glucose, Bld: 98 mg/dL (ref 70–99)
Potassium: 4.3 mmol/L (ref 3.5–5.1)
Sodium: 140 mmol/L (ref 135–145)
Total Bilirubin: 0.9 mg/dL (ref 0.3–1.2)
Total Protein: 7.8 g/dL (ref 6.5–8.1)

## 2022-02-21 MED ORDER — EPOETIN ALFA-EPBX 40000 UNIT/ML IJ SOLN
40000.0000 [IU] | Freq: Once | INTRAMUSCULAR | Status: AC
  Administered 2022-02-21: 40000 [IU] via SUBCUTANEOUS
  Filled 2022-02-21: qty 1

## 2022-02-21 NOTE — Telephone Encounter (Signed)
Scheduled per 11/06 los, patient has been called and notified of all weekly labs/injections.

## 2022-02-23 ENCOUNTER — Encounter: Payer: Self-pay | Admitting: Gastroenterology

## 2022-02-23 ENCOUNTER — Ambulatory Visit: Payer: Medicare Other | Admitting: Gastroenterology

## 2022-02-23 VITALS — BP 128/88 | HR 75 | Ht 68.0 in | Wt 155.8 lb

## 2022-02-23 DIAGNOSIS — K649 Unspecified hemorrhoids: Secondary | ICD-10-CM | POA: Diagnosis not present

## 2022-02-23 DIAGNOSIS — R197 Diarrhea, unspecified: Secondary | ICD-10-CM | POA: Diagnosis not present

## 2022-02-23 MED ORDER — CHOLESTYRAMINE 4 G PO PACK: 4.0000 g | PACK | Freq: Every day | ORAL | 2 refills | Status: DC

## 2022-02-23 MED ORDER — HYDROCORTISONE (PERIANAL) 2.5 % EX CREA: 1.0000 | TOPICAL_CREAM | Freq: Three times a day (TID) | CUTANEOUS | 0 refills | Status: DC | PRN

## 2022-02-23 NOTE — Patient Instructions (Signed)
We have sent the following medications to your pharmacy for you to pick up at your convenience: Questran and hydrocortisone cream.  MyChart message [place] in 7-10 days with an update.   The  GI providers would like to encourage you to use [facility] to communicate with providers for non-urgent requests or questions.  Due to long hold times on the telephone, sending your provider a message by [facility] may be a faster and more efficient way to get a response.  Please allow 48 business hours for a response.  Please remember that this is for non-urgent requests.

## 2022-02-23 NOTE — Progress Notes (Signed)
02/23/2022 Roy Johnson [id number] 15-Sep-1945   HISTORY OF PRESENT ILLNESS: This is a 76 year old male who is a patient of Dr. Blanch Media here for follow-up of his chronic diarrhea.  This is suspected to be IBS-D, but was worsened earlier this year by C. difficile and norovirus.  C. difficile was treated and he has been retested and negative x2 since then.  Last colonoscopy 2013 was unremarkable.  His diarrhea is about at baseline.  He uses Imodium fairly regularly, which does help some.  He has been apprehensive about repeat colonoscopy due to his MDS and bleeding risk.  Most recently he was prescribed Colestid by Dr. Henrene Pastor a couple of weeks ago, but has not been able to take that because the pill is too big.  He says that he has days where the stools will start out sludgy in the mornings and then by the evening it is just like water.  Always feels some soreness in his lower abdomen.  Gets some irritation from his hemorrhoids from having some many bowel movements.   Past Medical History:  Diagnosis Date   Anemia    Anesthesia of skin    Calculus of gallbladder with acute cholecystitis, with obstruction    Cellulitis of back    Chest pain    Chronic kidney disease (CKD), stage III (moderate) (HCC)    Colon polyp    Cough    Disorientation    Diverticulitis    Diverticulosis    Dyspnea on exertion    Elevated white blood cell count    Fatigue    Forgetfulness    GERD (gastroesophageal reflux disease)    Hearing loss    HTN (hypertension)    Hydrocele    Hyperkalemia    Hyperlipidemia    Hypertensive chronic kidney disease with stage 1 through stage 4 chronic kidney disease, or unspecified chronic kidney disease    Hypokalemia    IBS (irritable bowel syndrome)    Impacted cerumen, bilateral    Inguinal hernia    Insomnia    Lightheadedness    Liver disease    Lower abdominal pain    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   Mixed stress and urge urinary incontinence    Myelodysplastic  syndrome (HCC)    Nausea with vomiting    Numbness and tingling in both hands    Other amnesia    Other benign neoplasm of skin, unspecified    Overflow incontinence of urine    Pain in right leg    Paresthesia of skin    Prostate cancer (HCC)    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 His smoking use included cigarettes. He has never used smokeless tobacco. He reports that he does not currently use alcohol. He reports that he does not use drugs. family history includes Breast cancer in his mother; Colon polyps in his father; Heart disease in his father; Lung cancer in his father; Osteoarthritis in his mother; Prostate cancer in his father. No Known Allergies    Outpatient  Encounter Medications as of 02/23/2022  Medication Sig   amLODipine (NORVASC) 5 MG tablet Take 5 mg by mouth daily.   anagrelide (AGRYLIN) 1 MG capsule TAKE 1 CAPSULE(1 MG) BY MOUTH DAILY   cetirizine (ZYRTEC) 10 MG tablet TAKE 1 TABLET(10 MG) BY MOUTH DAILY   cholestyramine (QUESTRAN) 4 g packet Take 1 packet (4 g total) by mouth daily.   colestipol (COLESTID) 1 g tablet TAKE 2 TABLETS(2 GRAMS) BY MOUTH TWICE DAILY   diphenoxylate-atropine (LOMOTIL) 2.5-0.025 MG tablet Take 1 tablet by mouth 4 (four) times daily as needed.   epoetin alfa-epbx (RETACRIT) 47425 UNIT/ML injection Inject into the skin.   gabapentin (NEURONTIN) 100 MG capsule TAKE 1 CAPSULE BY MOUTH TWICE DAILY AND 2 AT NIGHT   hyoscyamine (LEVSIN SL) 0.125 MG SL tablet DISSOLVE 1 TABLET(0.125 MG) UNDER THE TONGUE EVERY 4 HOURS AS NEEDED   lenalidomide (REVLIMID) 5 MG capsule Take 5 mg by mouth daily.    Multiple Vitamin (MULTI-VITAMINS) TABS Take 1 tablet by mouth daily.   ondansetron (ZOFRAN) 8 MG tablet TAKE 1 TABLET(8 MG) BY MOUTH EVERY 8 HOURS AS NEEDED FOR NAUSEA OR VOMITING   pantoprazole (PROTONIX) 40 MG tablet Take 1 tablet (40 mg total) by mouth daily.   Probiotic Product (ALIGN) 4 MG CAPS Take 1 capsule by mouth daily.   vitamin B-12 (CYANOCOBALAMIN) 1000 MCG tablet Take 1 tablet by mouth daily as needed.   Facility-Administered Encounter Medications as of 02/23/2022  Medication   0.9 %  sodium chloride infusion     REVIEW OF SYSTEMS  : All other systems reviewed and negative except where noted in the History of Present Illness.   PHYSICAL EXAM: Ht '5\' 8"'$  (1.727 m)   Wt 155 lb 12.8 oz (70.7 kg)   BMI 23.69 kg/m  General: Well developed white male in no acute distress Head: Normocephalic and atraumatic Eyes:  Sclerae anicteric, conjunctiva pink. Ears: Normal auditory acuity Lungs: Clear throughout to auscultation; no W/R/R. Heart: Regular rate and rhythm; no M/R/G. Abdomen: Soft, non-distended.  BS present.  Minimal TTP along the lower abdomen. Musculoskeletal: Symmetrical with no gross deformities  Skin: No lesions on visible extremities Extremities: No edema  Neurological: Alert oriented x 4, grossly non-focal Psychological:  Alert and cooperative. 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  Had been complicated by C. difficile and norovirus earlier this year, but C. difficile was treated and recent C. difficile x2 have been negative.  He continues to decline repeat colonoscopy due to his MDS and bleeding risk, etc.  Last colonoscopy negative in 2013.  He was prescribed Colestid, but says that he could not take it because the pills were too big.  He is using Imodium fairly regularly, which does help some.  Can try Questran instead.  We will try 1 packet daily at dinnertime as that would be away from any other medications by couple of  hours.  Prescription to pharmacy.  He will message [place] back in about 10 days or so with an update on his symptoms. *Hemorrhoids:  They become irritated from having several bowel movements.  We will prescribe hydrocortisone cream to be used 3 times daily as needed.  Prescription to pharmacy.  CC:  [name], MD

## 2022-02-24 NOTE — Progress Notes (Signed)
Reviewed. Can increase Questran to 2 packets of 1 is not helpful.  Thanks

## 2022-02-28 ENCOUNTER — Ambulatory Visit
Admission: RE | Admit: 2022-02-28 | Discharge: 2022-02-28 | Disposition: A | Discharge status: Home / Self Care | Payer: Medicare Other | Source: Ambulatory Visit | Attending: Neurology | Admitting: Neurology

## 2022-02-28 ENCOUNTER — Inpatient Hospital Stay: Payer: Medicare Other

## 2022-02-28 ENCOUNTER — Other Ambulatory Visit: Payer: Self-pay

## 2022-02-28 VITALS — BP 132/58 | HR 77 | Resp 18

## 2022-02-28 DIAGNOSIS — R3915 Urgency of urination: Secondary | ICD-10-CM

## 2022-02-28 DIAGNOSIS — D469 Myelodysplastic syndrome, unspecified: Secondary | ICD-10-CM

## 2022-02-28 DIAGNOSIS — R413 Other amnesia: Secondary | ICD-10-CM

## 2022-02-28 DIAGNOSIS — G4710 Hypersomnia, unspecified: Secondary | ICD-10-CM

## 2022-02-28 DIAGNOSIS — D461 Refractory anemia with ring sideroblasts: Secondary | ICD-10-CM | POA: Diagnosis not present

## 2022-02-28 LAB — CBC WITH DIFFERENTIAL (CANCER CENTER ONLY)
Abs Immature Granulocytes: 0.09 10*3/uL — ABNORMAL HIGH (ref 0.00–0.07)
Basophils Absolute: 0.2 10*3/uL — ABNORMAL HIGH (ref 0.0–0.1)
Basophils Relative: 2 %
Eosinophils Absolute: 0.7 10*3/uL — ABNORMAL HIGH (ref 0.0–0.5)
Eosinophils Relative: 8 %
HCT: 29.7 % — ABNORMAL LOW (ref 39.0–52.0)
Hemoglobin: 9.5 g/dL — ABNORMAL LOW (ref 13.0–17.0)
Immature Granulocytes: 1 %
Lymphocytes Relative: 19 %
Lymphs Abs: 1.7 10*3/uL (ref 0.7–4.0)
MCH: 26 pg (ref 26.0–34.0)
MCHC: 32 g/dL (ref 30.0–36.0)
MCV: 81.4 fL (ref 80.0–100.0)
Monocytes Absolute: 0.6 10*3/uL (ref 0.1–1.0)
Monocytes Relative: 7 %
Neutro Abs: 5.5 10*3/uL (ref 1.7–7.7)
Neutrophils Relative %: 63 %
Platelet Count: 252 10*3/uL (ref 150–400)
RBC: 3.65 MIL/uL — ABNORMAL LOW (ref 4.22–5.81)
RDW: 35.5 % — ABNORMAL HIGH (ref 11.5–15.5)
WBC Count: 8.8 10*3/uL (ref 4.0–10.5)
nRBC: 0 % (ref 0.0–0.2)

## 2022-02-28 LAB — CMP (CANCER CENTER ONLY)
ALT: 20 U/L (ref 0–44)
AST: 10 U/L — ABNORMAL LOW (ref 15–41)
Albumin: 4.5 g/dL (ref 3.5–5.0)
Alkaline Phosphatase: 49 U/L (ref 38–126)
Anion gap: 4 — ABNORMAL LOW (ref 5–15)
BUN: 18 mg/dL (ref 8–23)
CO2: 27 mmol/L (ref 22–32)
Calcium: 9.3 mg/dL (ref 8.9–10.3)
Chloride: 110 mmol/L (ref 98–111)
Creatinine: 1.12 mg/dL (ref 0.61–1.24)
GFR, Estimated: >60 mL/min (ref >60)
Glucose, Bld: 98 mg/dL (ref 70–99)
Potassium: 4.6 mmol/L (ref 3.5–5.1)
Sodium: 141 mmol/L (ref 135–145)
Total Bilirubin: 0.8 mg/dL (ref 0.3–1.2)
Total Protein: 7.2 g/dL (ref 6.5–8.1)

## 2022-02-28 MED ORDER — EPOETIN ALFA-EPBX 40000 UNIT/ML IJ SOLN
40000.0000 [IU] | Freq: Once | INTRAMUSCULAR | Status: AC
  Administered 2022-02-28: 40000 [IU] via SUBCUTANEOUS
  Filled 2022-02-28: qty 1

## 2022-03-01 ENCOUNTER — Telehealth: Payer: Self-pay | Admitting: Neurology

## 2022-03-01 DIAGNOSIS — R202 Paresthesia of skin: Secondary | ICD-10-CM

## 2022-03-01 DIAGNOSIS — G3184 Mild cognitive impairment, so stated: Secondary | ICD-10-CM

## 2022-03-01 NOTE — Telephone Encounter (Signed)
"  MRI of the cervical spine showed mild degenerative changes but there is no evidence of spinal cord compression  There is small signal abnormality at the back part of C5-6 spinal cord  I have ordered laboratory evaluations, B12 copper, and some other inflammatory markers to rule out treatable etiology  He may come in for lab without appointment, we will let him know the blood test result.  Orders Placed This Encounter  Procedures   RPR   Vitamin B12   ANA w/Reflex if Positive   C-reactive protein   Sedimentation rate   Copper, serum   Vitamin E       IMPRESSION: This MRI of the cervical spine without contrast shows the following: Adjacent to C5-C6, there is a small focus of T2/STIR hyperintensity in the medial posterior columns.  This is nonspecific and could be due to demyelination, B12 or copper deficiency.  The appearance is less typical for compressive myelopathy. Multilevel degenerative changes as detailed above.  This causes borderline spinal stenosis at C4-C5 and C5-C6.  There is no nerve root compression.

## 2022-03-07 ENCOUNTER — Inpatient Hospital Stay: Payer: Medicare Other

## 2022-03-07 ENCOUNTER — Other Ambulatory Visit: Payer: Self-pay

## 2022-03-07 VITALS — BP 149/76 | HR 77 | Temp 98.0°F | Resp 18

## 2022-03-07 DIAGNOSIS — D461 Refractory anemia with ring sideroblasts: Secondary | ICD-10-CM | POA: Diagnosis not present

## 2022-03-07 DIAGNOSIS — D469 Myelodysplastic syndrome, unspecified: Secondary | ICD-10-CM

## 2022-03-07 LAB — CBC WITH DIFFERENTIAL (CANCER CENTER ONLY)
Abs Immature Granulocytes: 0.65 10*3/uL — ABNORMAL HIGH (ref 0.00–0.07)
Basophils Absolute: 0.2 10*3/uL — ABNORMAL HIGH (ref 0.0–0.1)
Basophils Relative: 2 %
Eosinophils Absolute: 0.4 10*3/uL (ref 0.0–0.5)
Eosinophils Relative: 4 %
HCT: 28.9 % — ABNORMAL LOW (ref 39.0–52.0)
Hemoglobin: 9.2 g/dL — ABNORMAL LOW (ref 13.0–17.0)
Immature Granulocytes: 6 %
Lymphocytes Relative: 16 %
Lymphs Abs: 1.8 10*3/uL (ref 0.7–4.0)
MCH: 25.8 pg — ABNORMAL LOW (ref 26.0–34.0)
MCHC: 31.8 g/dL (ref 30.0–36.0)
MCV: 81 fL (ref 80.0–100.0)
Monocytes Absolute: 0.7 10*3/uL (ref 0.1–1.0)
Monocytes Relative: 6 %
Neutro Abs: 7.4 10*3/uL (ref 1.7–7.7)
Neutrophils Relative %: 66 %
Platelet Count: 243 10*3/uL (ref 150–400)
RBC: 3.57 MIL/uL — ABNORMAL LOW (ref 4.22–5.81)
RDW: 35.9 % — ABNORMAL HIGH (ref 11.5–15.5)
WBC Count: 11.3 10*3/uL — ABNORMAL HIGH (ref 4.0–10.5)
nRBC: 0.5 % — ABNORMAL HIGH (ref 0.0–0.2)

## 2022-03-07 LAB — CMP (CANCER CENTER ONLY)
ALT: 17 U/L (ref 0–44)
AST: 9 U/L — ABNORMAL LOW (ref 15–41)
Albumin: 4.6 g/dL (ref 3.5–5.0)
Alkaline Phosphatase: 58 U/L (ref 38–126)
Anion gap: 5 (ref 5–15)
BUN: 26 mg/dL — ABNORMAL HIGH (ref 8–23)
CO2: 28 mmol/L (ref 22–32)
Calcium: 9.7 mg/dL (ref 8.9–10.3)
Chloride: 108 mmol/L (ref 98–111)
Creatinine: 1.33 mg/dL — ABNORMAL HIGH (ref 0.61–1.24)
GFR, Estimated: 55 mL/min — ABNORMAL LOW (ref >60)
Glucose, Bld: 93 mg/dL (ref 70–99)
Potassium: 4.1 mmol/L (ref 3.5–5.1)
Sodium: 141 mmol/L (ref 135–145)
Total Bilirubin: 0.9 mg/dL (ref 0.3–1.2)
Total Protein: 7.2 g/dL (ref 6.5–8.1)

## 2022-03-07 MED ORDER — EPOETIN ALFA-EPBX 40000 UNIT/ML IJ SOLN
40000.0000 [IU] | Freq: Once | INTRAMUSCULAR | Status: AC
  Administered 2022-03-07: 40000 [IU] via SUBCUTANEOUS
  Filled 2022-03-07: qty 1

## 2022-03-12 ENCOUNTER — Other Ambulatory Visit: Payer: Self-pay | Admitting: Internal Medicine

## 2022-03-14 ENCOUNTER — Other Ambulatory Visit: Payer: Self-pay

## 2022-03-14 ENCOUNTER — Inpatient Hospital Stay: Payer: Medicare Other | Attending: Internal Medicine

## 2022-03-14 ENCOUNTER — Inpatient Hospital Stay: Payer: Medicare Other

## 2022-03-14 VITALS — BP 142/69 | HR 72 | Temp 97.8°F | Resp 18

## 2022-03-14 DIAGNOSIS — D469 Myelodysplastic syndrome, unspecified: Secondary | ICD-10-CM

## 2022-03-14 DIAGNOSIS — Z79899 Other long term (current) drug therapy: Secondary | ICD-10-CM | POA: Diagnosis not present

## 2022-03-14 DIAGNOSIS — D461 Refractory anemia with ring sideroblasts: Secondary | ICD-10-CM | POA: Insufficient documentation

## 2022-03-14 DIAGNOSIS — D473 Essential (hemorrhagic) thrombocythemia: Secondary | ICD-10-CM | POA: Diagnosis present

## 2022-03-14 LAB — CMP (CANCER CENTER ONLY)
ALT: 14 U/L (ref 0–44)
AST: 10 U/L — ABNORMAL LOW (ref 15–41)
Albumin: 4.7 g/dL (ref 3.5–5.0)
Alkaline Phosphatase: 55 U/L (ref 38–126)
Anion gap: 6 (ref 5–15)
BUN: 22 mg/dL (ref 8–23)
CO2: 27 mmol/L (ref 22–32)
Calcium: 9.9 mg/dL (ref 8.9–10.3)
Chloride: 108 mmol/L (ref 98–111)
Creatinine: 1.19 mg/dL (ref 0.61–1.24)
GFR, Estimated: >60 mL/min (ref >60)
Glucose, Bld: 95 mg/dL (ref 70–99)
Potassium: 4.5 mmol/L (ref 3.5–5.1)
Sodium: 141 mmol/L (ref 135–145)
Total Bilirubin: 0.8 mg/dL (ref 0.3–1.2)
Total Protein: 7.5 g/dL (ref 6.5–8.1)

## 2022-03-14 LAB — CBC WITH DIFFERENTIAL (CANCER CENTER ONLY)
Abs Immature Granulocytes: 1.33 10*3/uL — ABNORMAL HIGH (ref 0.00–0.07)
Basophils Absolute: 0.2 10*3/uL — ABNORMAL HIGH (ref 0.0–0.1)
Basophils Relative: 2 %
Eosinophils Absolute: 0.8 10*3/uL — ABNORMAL HIGH (ref 0.0–0.5)
Eosinophils Relative: 5 %
HCT: 32.4 % — ABNORMAL LOW (ref 39.0–52.0)
Hemoglobin: 10.1 g/dL — ABNORMAL LOW (ref 13.0–17.0)
Immature Granulocytes: 8 %
Lymphocytes Relative: 16 %
Lymphs Abs: 2.6 10*3/uL (ref 0.7–4.0)
MCH: 25.6 pg — ABNORMAL LOW (ref 26.0–34.0)
MCHC: 31.2 g/dL (ref 30.0–36.0)
MCV: 82 fL (ref 80.0–100.0)
Monocytes Absolute: 0.5 10*3/uL (ref 0.1–1.0)
Monocytes Relative: 3 %
Neutro Abs: 10.4 10*3/uL — ABNORMAL HIGH (ref 1.7–7.7)
Neutrophils Relative %: 66 %
Platelet Count: 285 10*3/uL (ref 150–400)
RBC: 3.95 MIL/uL — ABNORMAL LOW (ref 4.22–5.81)
RDW: 35.8 % — ABNORMAL HIGH (ref 11.5–15.5)
WBC Count: 15.8 10*3/uL — ABNORMAL HIGH (ref 4.0–10.5)
nRBC: 0.3 % — ABNORMAL HIGH (ref 0.0–0.2)

## 2022-03-14 MED ORDER — EPOETIN ALFA-EPBX 40000 UNIT/ML IJ SOLN
40000.0000 [IU] | Freq: Once | INTRAMUSCULAR | Status: AC
  Administered 2022-03-14: 40000 [IU] via SUBCUTANEOUS
  Filled 2022-03-14: qty 1

## 2022-03-21 ENCOUNTER — Inpatient Hospital Stay: Payer: Medicare Other

## 2022-03-21 ENCOUNTER — Other Ambulatory Visit: Payer: Self-pay

## 2022-03-21 VITALS — BP 134/73 | HR 65 | Temp 97.8°F | Resp 18

## 2022-03-21 DIAGNOSIS — D469 Myelodysplastic syndrome, unspecified: Secondary | ICD-10-CM

## 2022-03-21 DIAGNOSIS — D461 Refractory anemia with ring sideroblasts: Secondary | ICD-10-CM | POA: Diagnosis not present

## 2022-03-21 LAB — CMP (CANCER CENTER ONLY)
ALT: 16 U/L (ref 0–44)
AST: 10 U/L — ABNORMAL LOW (ref 15–41)
Albumin: 4.4 g/dL (ref 3.5–5.0)
Alkaline Phosphatase: 59 U/L (ref 38–126)
Anion gap: 5 (ref 5–15)
BUN: 18 mg/dL (ref 8–23)
CO2: 27 mmol/L (ref 22–32)
Calcium: 9.7 mg/dL (ref 8.9–10.3)
Chloride: 107 mmol/L (ref 98–111)
Creatinine: 1.15 mg/dL (ref 0.61–1.24)
GFR, Estimated: >60 mL/min (ref >60)
Glucose, Bld: 96 mg/dL (ref 70–99)
Potassium: 4.4 mmol/L (ref 3.5–5.1)
Sodium: 139 mmol/L (ref 135–145)
Total Bilirubin: 0.7 mg/dL (ref 0.3–1.2)
Total Protein: 7.3 g/dL (ref 6.5–8.1)

## 2022-03-21 LAB — CBC WITH DIFFERENTIAL (CANCER CENTER ONLY)
Abs Immature Granulocytes: 0.6 10*3/uL — ABNORMAL HIGH (ref 0.00–0.07)
Basophils Absolute: 0.2 10*3/uL — ABNORMAL HIGH (ref 0.0–0.1)
Basophils Relative: 1 %
Eosinophils Absolute: 1.3 10*3/uL — ABNORMAL HIGH (ref 0.0–0.5)
Eosinophils Relative: 9 %
HCT: 31.4 % — ABNORMAL LOW (ref 39.0–52.0)
Hemoglobin: 9.9 g/dL — ABNORMAL LOW (ref 13.0–17.0)
Immature Granulocytes: 4 %
Lymphocytes Relative: 16 %
Lymphs Abs: 2.4 10*3/uL (ref 0.7–4.0)
MCH: 26 pg (ref 26.0–34.0)
MCHC: 31.5 g/dL (ref 30.0–36.0)
MCV: 82.4 fL (ref 80.0–100.0)
Monocytes Absolute: 1.3 10*3/uL — ABNORMAL HIGH (ref 0.1–1.0)
Monocytes Relative: 9 %
Neutro Abs: 9.6 10*3/uL — ABNORMAL HIGH (ref 1.7–7.7)
Neutrophils Relative %: 61 %
Platelet Count: 273 10*3/uL (ref 150–400)
RBC: 3.81 MIL/uL — ABNORMAL LOW (ref 4.22–5.81)
RDW: 33.9 % — ABNORMAL HIGH (ref 11.5–15.5)
WBC Count: 15.5 10*3/uL — ABNORMAL HIGH (ref 4.0–10.5)
nRBC: 0 % (ref 0.0–0.2)

## 2022-03-21 MED ORDER — EPOETIN ALFA-EPBX 40000 UNIT/ML IJ SOLN
40000.0000 [IU] | Freq: Once | INTRAMUSCULAR | Status: AC
  Administered 2022-03-21: 40000 [IU] via SUBCUTANEOUS
  Filled 2022-03-21: qty 1

## 2022-03-28 ENCOUNTER — Other Ambulatory Visit: Payer: Medicare Other

## 2022-03-28 ENCOUNTER — Ambulatory Visit: Payer: Medicare Other

## 2022-03-29 ENCOUNTER — Inpatient Hospital Stay: Payer: Medicare Other

## 2022-03-29 ENCOUNTER — Other Ambulatory Visit: Payer: Self-pay

## 2022-03-29 VITALS — BP 130/68 | HR 74 | Temp 97.8°F | Resp 17

## 2022-03-29 DIAGNOSIS — D469 Myelodysplastic syndrome, unspecified: Secondary | ICD-10-CM

## 2022-03-29 DIAGNOSIS — D461 Refractory anemia with ring sideroblasts: Secondary | ICD-10-CM | POA: Diagnosis not present

## 2022-03-29 LAB — CMP (CANCER CENTER ONLY)
ALT: 20 U/L (ref 0–44)
AST: 16 U/L (ref 15–41)
Albumin: 4.5 g/dL (ref 3.5–5.0)
Alkaline Phosphatase: 50 U/L (ref 38–126)
Anion gap: 7 (ref 5–15)
BUN: 34 mg/dL — ABNORMAL HIGH (ref 8–23)
CO2: 23 mmol/L (ref 22–32)
Calcium: 9.2 mg/dL (ref 8.9–10.3)
Chloride: 109 mmol/L (ref 98–111)
Creatinine: 1.29 mg/dL — ABNORMAL HIGH (ref 0.61–1.24)
GFR, Estimated: 57 mL/min — ABNORMAL LOW (ref >60)
Glucose, Bld: 102 mg/dL — ABNORMAL HIGH (ref 70–99)
Potassium: 4.6 mmol/L (ref 3.5–5.1)
Sodium: 139 mmol/L (ref 135–145)
Total Bilirubin: 1.1 mg/dL (ref 0.3–1.2)
Total Protein: 7.7 g/dL (ref 6.5–8.1)

## 2022-03-29 LAB — CBC WITH DIFFERENTIAL (CANCER CENTER ONLY)
Abs Immature Granulocytes: 0.09 10*3/uL — ABNORMAL HIGH (ref 0.00–0.07)
Basophils Absolute: 0.2 10*3/uL — ABNORMAL HIGH (ref 0.0–0.1)
Basophils Relative: 2 %
Eosinophils Absolute: 0.5 10*3/uL (ref 0.0–0.5)
Eosinophils Relative: 5 %
HCT: 30.5 % — ABNORMAL LOW (ref 39.0–52.0)
Hemoglobin: 9.9 g/dL — ABNORMAL LOW (ref 13.0–17.0)
Immature Granulocytes: 1 %
Lymphocytes Relative: 19 %
Lymphs Abs: 1.8 10*3/uL (ref 0.7–4.0)
MCH: 26.3 pg (ref 26.0–34.0)
MCHC: 32.5 g/dL (ref 30.0–36.0)
MCV: 81.1 fL (ref 80.0–100.0)
Monocytes Absolute: 0.7 10*3/uL (ref 0.1–1.0)
Monocytes Relative: 8 %
Neutro Abs: 6.2 10*3/uL (ref 1.7–7.7)
Neutrophils Relative %: 65 %
Platelet Count: 242 10*3/uL (ref 150–400)
RBC: 3.76 MIL/uL — ABNORMAL LOW (ref 4.22–5.81)
RDW: 34 % — ABNORMAL HIGH (ref 11.5–15.5)
WBC Count: 9.5 10*3/uL (ref 4.0–10.5)
nRBC: 0.2 % (ref 0.0–0.2)

## 2022-03-29 MED ORDER — EPOETIN ALFA-EPBX 40000 UNIT/ML IJ SOLN
40000.0000 [IU] | Freq: Once | INTRAMUSCULAR | Status: AC
  Administered 2022-03-29: 40000 [IU] via SUBCUTANEOUS
  Filled 2022-03-29: qty 1

## 2022-04-05 ENCOUNTER — Inpatient Hospital Stay: Payer: Medicare Other

## 2022-04-05 VITALS — BP 131/69 | HR 70 | Temp 97.5°F | Resp 16 | Wt 155.1 lb

## 2022-04-05 DIAGNOSIS — D469 Myelodysplastic syndrome, unspecified: Secondary | ICD-10-CM

## 2022-04-05 DIAGNOSIS — D461 Refractory anemia with ring sideroblasts: Secondary | ICD-10-CM | POA: Diagnosis not present

## 2022-04-05 LAB — CMP (CANCER CENTER ONLY)
ALT: 11 U/L (ref 0–44)
AST: 7 U/L — ABNORMAL LOW (ref 15–41)
Albumin: 4.3 g/dL (ref 3.5–5.0)
Alkaline Phosphatase: 51 U/L (ref 38–126)
Anion gap: 5 (ref 5–15)
BUN: 23 mg/dL (ref 8–23)
CO2: 27 mmol/L (ref 22–32)
Calcium: 9.4 mg/dL (ref 8.9–10.3)
Chloride: 110 mmol/L (ref 98–111)
Creatinine: 1.35 mg/dL — ABNORMAL HIGH (ref 0.61–1.24)
GFR, Estimated: 54 mL/min — ABNORMAL LOW (ref >60)
Glucose, Bld: 98 mg/dL (ref 70–99)
Potassium: 4.3 mmol/L (ref 3.5–5.1)
Sodium: 142 mmol/L (ref 135–145)
Total Bilirubin: 0.8 mg/dL (ref 0.3–1.2)
Total Protein: 7.2 g/dL (ref 6.5–8.1)

## 2022-04-05 LAB — CBC WITH DIFFERENTIAL (CANCER CENTER ONLY)
Abs Immature Granulocytes: 0.95 10*3/uL — ABNORMAL HIGH (ref 0.00–0.07)
Basophils Absolute: 0.3 10*3/uL — ABNORMAL HIGH (ref 0.0–0.1)
Basophils Relative: 2 %
Eosinophils Absolute: 0.5 10*3/uL (ref 0.0–0.5)
Eosinophils Relative: 4 %
HCT: 30 % — ABNORMAL LOW (ref 39.0–52.0)
Hemoglobin: 9.5 g/dL — ABNORMAL LOW (ref 13.0–17.0)
Immature Granulocytes: 7 %
Lymphocytes Relative: 16 %
Lymphs Abs: 2.2 10*3/uL (ref 0.7–4.0)
MCH: 26.4 pg (ref 26.0–34.0)
MCHC: 31.7 g/dL (ref 30.0–36.0)
MCV: 83.3 fL (ref 80.0–100.0)
Monocytes Absolute: 0.7 10*3/uL (ref 0.1–1.0)
Monocytes Relative: 5 %
Neutro Abs: 9.2 10*3/uL — ABNORMAL HIGH (ref 1.7–7.7)
Neutrophils Relative %: 66 %
Platelet Count: 293 10*3/uL (ref 150–400)
RBC: 3.6 MIL/uL — ABNORMAL LOW (ref 4.22–5.81)
RDW: 34.3 % — ABNORMAL HIGH (ref 11.5–15.5)
WBC Count: 13.9 10*3/uL — ABNORMAL HIGH (ref 4.0–10.5)
nRBC: 0.5 % — ABNORMAL HIGH (ref 0.0–0.2)

## 2022-04-05 MED ORDER — EPOETIN ALFA-EPBX 40000 UNIT/ML IJ SOLN
40000.0000 [IU] | Freq: Once | INTRAMUSCULAR | Status: AC
  Administered 2022-04-05: 40000 [IU] via SUBCUTANEOUS
  Filled 2022-04-05: qty 1

## 2022-04-05 NOTE — Patient Instructions (Signed)

## 2022-04-12 ENCOUNTER — Inpatient Hospital Stay: Payer: Medicare Other

## 2022-04-12 ENCOUNTER — Other Ambulatory Visit: Payer: Self-pay

## 2022-04-12 ENCOUNTER — Inpatient Hospital Stay: Payer: Medicare Other | Attending: Internal Medicine

## 2022-04-12 VITALS — BP 138/72 | HR 69 | Temp 98.2°F | Resp 18

## 2022-04-12 DIAGNOSIS — Z79899 Other long term (current) drug therapy: Secondary | ICD-10-CM | POA: Diagnosis not present

## 2022-04-12 DIAGNOSIS — D461 Refractory anemia with ring sideroblasts: Secondary | ICD-10-CM | POA: Diagnosis not present

## 2022-04-12 DIAGNOSIS — D469 Myelodysplastic syndrome, unspecified: Secondary | ICD-10-CM

## 2022-04-12 DIAGNOSIS — D473 Essential (hemorrhagic) thrombocythemia: Secondary | ICD-10-CM | POA: Diagnosis present

## 2022-04-12 LAB — CBC WITH DIFFERENTIAL (CANCER CENTER ONLY)
Abs Immature Granulocytes: 0.81 10*3/uL — ABNORMAL HIGH (ref 0.00–0.07)
Basophils Absolute: 0.2 10*3/uL — ABNORMAL HIGH (ref 0.0–0.1)
Basophils Relative: 1 %
Eosinophils Absolute: 1 10*3/uL — ABNORMAL HIGH (ref 0.0–0.5)
Eosinophils Relative: 6 %
HCT: 31.8 % — ABNORMAL LOW (ref 39.0–52.0)
Hemoglobin: 9.9 g/dL — ABNORMAL LOW (ref 13.0–17.0)
Immature Granulocytes: 5 %
Lymphocytes Relative: 15 %
Lymphs Abs: 2.6 10*3/uL (ref 0.7–4.0)
MCH: 25.8 pg — ABNORMAL LOW (ref 26.0–34.0)
MCHC: 31.1 g/dL (ref 30.0–36.0)
MCV: 83 fL (ref 80.0–100.0)
Monocytes Absolute: 0.8 10*3/uL (ref 0.1–1.0)
Monocytes Relative: 5 %
Neutro Abs: 11.4 10*3/uL — ABNORMAL HIGH (ref 1.7–7.7)
Neutrophils Relative %: 68 %
Platelet Count: 316 10*3/uL (ref 150–400)
RBC: 3.83 MIL/uL — ABNORMAL LOW (ref 4.22–5.81)
RDW: 34.4 % — ABNORMAL HIGH (ref 11.5–15.5)
WBC Count: 16.8 10*3/uL — ABNORMAL HIGH (ref 4.0–10.5)

## 2022-04-12 LAB — CMP (CANCER CENTER ONLY)
ALT: 13 U/L (ref 0–44)
AST: 11 U/L — ABNORMAL LOW (ref 15–41)
Albumin: 4.5 g/dL (ref 3.5–5.0)
Alkaline Phosphatase: 49 U/L (ref 38–126)
Anion gap: 5 (ref 5–15)
BUN: 22 mg/dL (ref 8–23)
CO2: 26 mmol/L (ref 22–32)
Calcium: 9.5 mg/dL (ref 8.9–10.3)
Chloride: 107 mmol/L (ref 98–111)
Creatinine: 1.2 mg/dL (ref 0.61–1.24)
GFR, Estimated: >60 mL/min (ref >60)
Glucose, Bld: 76 mg/dL (ref 70–99)
Potassium: 4.7 mmol/L (ref 3.5–5.1)
Sodium: 138 mmol/L (ref 135–145)
Total Bilirubin: 0.8 mg/dL (ref 0.3–1.2)
Total Protein: 7.4 g/dL (ref 6.5–8.1)

## 2022-04-12 MED ORDER — EPOETIN ALFA-EPBX 40000 UNIT/ML IJ SOLN
40000.0000 [IU] | Freq: Once | INTRAMUSCULAR | Status: AC
  Administered 2022-04-12: 40000 [IU] via SUBCUTANEOUS
  Filled 2022-04-12: qty 1

## 2022-04-13 ENCOUNTER — Other Ambulatory Visit: Payer: Self-pay | Admitting: Neurology

## 2022-04-14 NOTE — Telephone Encounter (Signed)
Rx refilled.

## 2022-04-15 ENCOUNTER — Other Ambulatory Visit: Payer: Self-pay

## 2022-04-15 DIAGNOSIS — D469 Myelodysplastic syndrome, unspecified: Secondary | ICD-10-CM

## 2022-04-18 ENCOUNTER — Inpatient Hospital Stay: Payer: Medicare Other

## 2022-04-18 ENCOUNTER — Other Ambulatory Visit: Payer: Self-pay

## 2022-04-18 VITALS — BP 128/70 | HR 64 | Temp 98.2°F | Resp 20

## 2022-04-18 DIAGNOSIS — D469 Myelodysplastic syndrome, unspecified: Secondary | ICD-10-CM

## 2022-04-18 DIAGNOSIS — D461 Refractory anemia with ring sideroblasts: Secondary | ICD-10-CM | POA: Diagnosis not present

## 2022-04-18 LAB — COMPREHENSIVE METABOLIC PANEL
ALT: 21 U/L (ref 0–44)
AST: 13 U/L — ABNORMAL LOW (ref 15–41)
Albumin: 4.4 g/dL (ref 3.5–5.0)
Alkaline Phosphatase: 54 U/L (ref 38–126)
Anion gap: 4 — ABNORMAL LOW (ref 5–15)
BUN: 22 mg/dL (ref 8–23)
CO2: 29 mmol/L (ref 22–32)
Calcium: 9.4 mg/dL (ref 8.9–10.3)
Chloride: 106 mmol/L (ref 98–111)
Creatinine, Ser: 1.43 mg/dL — ABNORMAL HIGH (ref 0.61–1.24)
GFR, Estimated: 51 mL/min — ABNORMAL LOW (ref >60)
Glucose, Bld: 84 mg/dL (ref 70–99)
Potassium: 4.6 mmol/L (ref 3.5–5.1)
Sodium: 139 mmol/L (ref 135–145)
Total Bilirubin: 0.8 mg/dL (ref 0.3–1.2)
Total Protein: 6.6 g/dL (ref 6.5–8.1)

## 2022-04-18 LAB — CBC WITH DIFFERENTIAL/PLATELET
Abs Immature Granulocytes: 0.34 10*3/uL — ABNORMAL HIGH (ref 0.00–0.07)
Basophils Absolute: 0.2 10*3/uL — ABNORMAL HIGH (ref 0.0–0.1)
Basophils Relative: 2 %
Eosinophils Absolute: 1.3 10*3/uL — ABNORMAL HIGH (ref 0.0–0.5)
Eosinophils Relative: 11 %
HCT: 29.5 % — ABNORMAL LOW (ref 39.0–52.0)
Hemoglobin: 9.6 g/dL — ABNORMAL LOW (ref 13.0–17.0)
Immature Granulocytes: 3 %
Lymphocytes Relative: 19 %
Lymphs Abs: 2.3 10*3/uL (ref 0.7–4.0)
MCH: 26.7 pg (ref 26.0–34.0)
MCHC: 32.5 g/dL (ref 30.0–36.0)
MCV: 81.9 fL (ref 80.0–100.0)
Monocytes Absolute: 1 10*3/uL (ref 0.1–1.0)
Monocytes Relative: 8 %
Neutro Abs: 7.1 10*3/uL (ref 1.7–7.7)
Neutrophils Relative %: 57 %
Platelets: 225 10*3/uL (ref 150–400)
RBC: 3.6 MIL/uL — ABNORMAL LOW (ref 4.22–5.81)
RDW: 33.9 % — ABNORMAL HIGH (ref 11.5–15.5)
WBC: 12.1 10*3/uL — ABNORMAL HIGH (ref 4.0–10.5)
nRBC: 0 % (ref 0.0–0.2)

## 2022-04-18 MED ORDER — EPOETIN ALFA-EPBX 40000 UNIT/ML IJ SOLN
40000.0000 [IU] | Freq: Once | INTRAMUSCULAR | Status: AC
  Administered 2022-04-18: 40000 [IU] via SUBCUTANEOUS
  Filled 2022-04-18: qty 1

## 2022-04-25 ENCOUNTER — Inpatient Hospital Stay: Payer: Medicare Other

## 2022-04-25 ENCOUNTER — Telehealth: Payer: Self-pay | Admitting: Medical Oncology

## 2022-04-25 NOTE — Telephone Encounter (Signed)
Pt cancelled appt today . He is in [place] , [place] until May. He will see his oncologist today and call us when he gets back in [place].

## 2022-05-02 ENCOUNTER — Other Ambulatory Visit: Payer: Medicare Other

## 2022-05-02 ENCOUNTER — Ambulatory Visit: Payer: Medicare Other

## 2022-07-12 ENCOUNTER — Other Ambulatory Visit: Payer: Self-pay | Admitting: Gastroenterology

## 2022-08-08 ENCOUNTER — Telehealth: Payer: Self-pay | Admitting: Medical Oncology

## 2022-08-08 NOTE — Telephone Encounter (Signed)
Appts made for May. Schedule message sent.

## 2022-08-10 ENCOUNTER — Telehealth: Payer: Self-pay | Admitting: Internal Medicine

## 2022-08-10 NOTE — Telephone Encounter (Signed)
Scheduled per 04/29 scheduled message, patient has been called and voicemail was left.

## 2022-08-16 ENCOUNTER — Other Ambulatory Visit: Payer: Self-pay

## 2022-08-16 ENCOUNTER — Inpatient Hospital Stay: Payer: Medicare Other | Attending: Internal Medicine

## 2022-08-16 ENCOUNTER — Inpatient Hospital Stay: Payer: Medicare Other

## 2022-08-16 VITALS — BP 139/70 | HR 65 | Temp 97.9°F | Resp 18

## 2022-08-16 DIAGNOSIS — Z8546 Personal history of malignant neoplasm of prostate: Secondary | ICD-10-CM | POA: Diagnosis not present

## 2022-08-16 DIAGNOSIS — D461 Refractory anemia with ring sideroblasts: Secondary | ICD-10-CM | POA: Diagnosis present

## 2022-08-16 DIAGNOSIS — Z7961 Long term (current) use of immunomodulator: Secondary | ICD-10-CM | POA: Insufficient documentation

## 2022-08-16 DIAGNOSIS — Z86018 Personal history of other benign neoplasm: Secondary | ICD-10-CM | POA: Diagnosis not present

## 2022-08-16 DIAGNOSIS — M2550 Pain in unspecified joint: Secondary | ICD-10-CM | POA: Diagnosis not present

## 2022-08-16 DIAGNOSIS — R5383 Other fatigue: Secondary | ICD-10-CM | POA: Diagnosis not present

## 2022-08-16 DIAGNOSIS — Z9079 Acquired absence of other genital organ(s): Secondary | ICD-10-CM | POA: Insufficient documentation

## 2022-08-16 DIAGNOSIS — Z79899 Other long term (current) drug therapy: Secondary | ICD-10-CM | POA: Diagnosis not present

## 2022-08-16 DIAGNOSIS — D469 Myelodysplastic syndrome, unspecified: Secondary | ICD-10-CM

## 2022-08-16 DIAGNOSIS — G629 Polyneuropathy, unspecified: Secondary | ICD-10-CM | POA: Diagnosis not present

## 2022-08-16 DIAGNOSIS — D72829 Elevated white blood cell count, unspecified: Secondary | ICD-10-CM | POA: Insufficient documentation

## 2022-08-16 DIAGNOSIS — D473 Essential (hemorrhagic) thrombocythemia: Secondary | ICD-10-CM | POA: Diagnosis not present

## 2022-08-16 DIAGNOSIS — Z8719 Personal history of other diseases of the digestive system: Secondary | ICD-10-CM | POA: Diagnosis not present

## 2022-08-16 LAB — CBC WITH DIFFERENTIAL/PLATELET
Abs Immature Granulocytes: 0.33 10*3/uL — ABNORMAL HIGH (ref 0.00–0.07)
Basophils Absolute: 0.4 10*3/uL — ABNORMAL HIGH (ref 0.0–0.1)
Basophils Relative: 3 %
Eosinophils Absolute: 0.5 10*3/uL (ref 0.0–0.5)
Eosinophils Relative: 4 %
HCT: 29.9 % — ABNORMAL LOW (ref 39.0–52.0)
Hemoglobin: 9.2 g/dL — ABNORMAL LOW (ref 13.0–17.0)
Immature Granulocytes: 3 %
Lymphocytes Relative: 15 %
Lymphs Abs: 1.9 10*3/uL (ref 0.7–4.0)
MCH: 24.8 pg — ABNORMAL LOW (ref 26.0–34.0)
MCHC: 30.8 g/dL (ref 30.0–36.0)
MCV: 80.6 fL (ref 80.0–100.0)
Monocytes Absolute: 0.9 10*3/uL (ref 0.1–1.0)
Monocytes Relative: 7 %
Neutro Abs: 8.9 10*3/uL — ABNORMAL HIGH (ref 1.7–7.7)
Neutrophils Relative %: 68 %
Platelets: 346 10*3/uL (ref 150–400)
RBC: 3.71 MIL/uL — ABNORMAL LOW (ref 4.22–5.81)
RDW: 35.4 % — ABNORMAL HIGH (ref 11.5–15.5)
WBC: 12.8 10*3/uL — ABNORMAL HIGH (ref 4.0–10.5)
nRBC: 0.5 % — ABNORMAL HIGH (ref 0.0–0.2)

## 2022-08-16 LAB — CMP (CANCER CENTER ONLY)
ALT: 18 U/L (ref 0–44)
AST: 14 U/L — ABNORMAL LOW (ref 15–41)
Albumin: 4.3 g/dL (ref 3.5–5.0)
Alkaline Phosphatase: 51 U/L (ref 38–126)
Anion gap: 6 (ref 5–15)
BUN: 27 mg/dL — ABNORMAL HIGH (ref 8–23)
CO2: 24 mmol/L (ref 22–32)
Calcium: 9.2 mg/dL (ref 8.9–10.3)
Chloride: 109 mmol/L (ref 98–111)
Creatinine: 1.42 mg/dL — ABNORMAL HIGH (ref 0.61–1.24)
GFR, Estimated: 51 mL/min — ABNORMAL LOW (ref >60)
Glucose, Bld: 94 mg/dL (ref 70–99)
Potassium: 4.6 mmol/L (ref 3.5–5.1)
Sodium: 139 mmol/L (ref 135–145)
Total Bilirubin: 1.1 mg/dL (ref 0.3–1.2)
Total Protein: 7.3 g/dL (ref 6.5–8.1)

## 2022-08-16 MED ORDER — EPOETIN ALFA-EPBX 40000 UNIT/ML IJ SOLN
40000.0000 [IU] | Freq: Once | INTRAMUSCULAR | Status: AC
  Administered 2022-08-16: 40000 [IU] via SUBCUTANEOUS
  Filled 2022-08-16: qty 1

## 2022-08-23 ENCOUNTER — Ambulatory Visit: Payer: Medicare Other | Admitting: Internal Medicine

## 2022-08-23 ENCOUNTER — Ambulatory Visit: Payer: Medicare Other

## 2022-08-23 ENCOUNTER — Inpatient Hospital Stay: Payer: Medicare Other | Admitting: Internal Medicine

## 2022-08-23 ENCOUNTER — Other Ambulatory Visit: Payer: Medicare Other

## 2022-08-23 ENCOUNTER — Inpatient Hospital Stay: Payer: Medicare Other

## 2022-08-23 ENCOUNTER — Telehealth: Payer: Self-pay | Admitting: Internal Medicine

## 2022-08-23 VITALS — BP 148/75 | HR 74 | Temp 97.6°F | Resp 14 | Wt 158.0 lb

## 2022-08-23 DIAGNOSIS — D469 Myelodysplastic syndrome, unspecified: Secondary | ICD-10-CM

## 2022-08-23 DIAGNOSIS — D461 Refractory anemia with ring sideroblasts: Secondary | ICD-10-CM | POA: Diagnosis not present

## 2022-08-23 LAB — CMP (CANCER CENTER ONLY)
ALT: 14 U/L (ref 0–44)
AST: 11 U/L — ABNORMAL LOW (ref 15–41)
Albumin: 4.7 g/dL (ref 3.5–5.0)
Alkaline Phosphatase: 52 U/L (ref 38–126)
Anion gap: 6 (ref 5–15)
BUN: 25 mg/dL — ABNORMAL HIGH (ref 8–23)
CO2: 26 mmol/L (ref 22–32)
Calcium: 9.3 mg/dL (ref 8.9–10.3)
Chloride: 109 mmol/L (ref 98–111)
Creatinine: 1.34 mg/dL — ABNORMAL HIGH (ref 0.61–1.24)
GFR, Estimated: 55 mL/min — ABNORMAL LOW (ref >60)
Glucose, Bld: 90 mg/dL (ref 70–99)
Potassium: 4.7 mmol/L (ref 3.5–5.1)
Sodium: 141 mmol/L (ref 135–145)
Total Bilirubin: 0.9 mg/dL (ref 0.3–1.2)
Total Protein: 7.4 g/dL (ref 6.5–8.1)

## 2022-08-23 LAB — CBC WITH DIFFERENTIAL (CANCER CENTER ONLY)
Abs Immature Granulocytes: 1.42 10*3/uL — ABNORMAL HIGH (ref 0.00–0.07)
Basophils Absolute: 0.4 10*3/uL — ABNORMAL HIGH (ref 0.0–0.1)
Basophils Relative: 2 %
Eosinophils Absolute: 0.8 10*3/uL — ABNORMAL HIGH (ref 0.0–0.5)
Eosinophils Relative: 4 %
HCT: 32.9 % — ABNORMAL LOW (ref 39.0–52.0)
Hemoglobin: 10 g/dL — ABNORMAL LOW (ref 13.0–17.0)
Immature Granulocytes: 7 %
Lymphocytes Relative: 14 %
Lymphs Abs: 2.7 10*3/uL (ref 0.7–4.0)
MCH: 25.7 pg — ABNORMAL LOW (ref 26.0–34.0)
MCHC: 30.4 g/dL (ref 30.0–36.0)
MCV: 84.6 fL (ref 80.0–100.0)
Monocytes Absolute: 0.7 10*3/uL (ref 0.1–1.0)
Monocytes Relative: 4 %
Neutro Abs: 13.4 10*3/uL — ABNORMAL HIGH (ref 1.7–7.7)
Neutrophils Relative %: 69 %
Platelet Count: 449 10*3/uL — ABNORMAL HIGH (ref 150–400)
RBC: 3.89 MIL/uL — ABNORMAL LOW (ref 4.22–5.81)
RDW: 35.1 % — ABNORMAL HIGH (ref 11.5–15.5)
WBC Count: 19.5 10*3/uL — ABNORMAL HIGH (ref 4.0–10.5)
nRBC: 0.3 % — ABNORMAL HIGH (ref 0.0–0.2)

## 2022-08-23 LAB — LACTATE DEHYDROGENASE: LDH: 342 U/L — ABNORMAL HIGH (ref 98–192)

## 2022-08-23 MED ORDER — EPOETIN ALFA-EPBX 40000 UNIT/ML IJ SOLN
40000.0000 [IU] | Freq: Once | INTRAMUSCULAR | Status: AC
  Administered 2022-08-23: 40000 [IU] via SUBCUTANEOUS
  Filled 2022-08-23: qty 1

## 2022-08-23 NOTE — Progress Notes (Signed)
[facility]  [phone] [phone]  OFFICE PROGRESS NOTE  Garlan Fillers, MD [address] 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   PRIOR THERAPY: Previous treatment with combination of Hydrea and anagrelide.  CURRENT THERAPY:  1) Revlimid 5 mg by mouth daily 2 weeks on and 2 weeks off.  Started 03/25/2016.  Managed by Dr[facility] Anise [facility]vo at [facility]. 2) anagrelide 0.5 mg p.o. twice daily. 3) Aranesp 300 mcg subcutaneously every 3 weeks.  This is switched to Procrit 400 mcg subcutaneously every 2 weeks starting April 13, 2017.  The frequency of the Procrit was changed to weekly schedule when he was in [place].  He is currently on Retacrit weekly as a biosimilar to Procrit.  INTERVAL HISTORY: Roy Johnson 77 y.o. male returns to the clinic today for follow-up visit after returning back to [place] from spending 6 months in [place].  The patient is feeling fine today with no concerning complaints except for the persistent fatigue and peripheral neuropathy.  He is currently on gabapentin and followed by neurology.  He denied having any current chest pain, shortness of breath, cough or hemoptysis.  He has no recent weight loss or night sweats.  He has no headache or visual changes.  He is here today for evaluation before resuming his treatment with Retacrit.  MEDICAL HISTORY: Past Medical History:  Diagnosis Date   Anemia    Anesthesia of skin    Calculus of gallbladder with acute cholecystitis, with obstruction    Cellulitis of back    Chest pain    Chronic kidney disease (CKD), stage III (moderate) (HCC)    Colon polyp    Cough    Disorientation    Diverticulitis    Diverticulosis    Dyspnea on exertion    Elevated white blood cell count    Fatigue     Forgetfulness    GERD (gastroesophageal reflux disease)    Hearing loss    HTN (hypertension)    Hydrocele    Hyperkalemia    Hyperlipidemia    Hypertensive chronic kidney disease with stage 1 through stage 4 chronic kidney disease, or unspecified chronic kidney disease    Hypokalemia    IBS (irritable bowel syndrome)    Impacted cerumen, bilateral    Inguinal hernia    Insomnia    Lightheadedness    Liver disease    Lower abdominal pain    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  MEDICATIONS:  Current Outpatient Medications  Medication Sig Dispense Refill   amLODipine (NORVASC) 5 MG tablet Take 5 mg by mouth daily.  2   anagrelide (AGRYLIN)  1 MG capsule TAKE 1 CAPSULE(1 MG) BY MOUTH DAILY 90 capsule 0   cetirizine (ZYRTEC) 10 MG tablet TAKE 1 TABLET(10 MG) BY MOUTH DAILY     cholestyramine (QUESTRAN) 4 g packet MIX AND DRINK 1 PACKET(4 GRAMS) BY MOUTH DAILY 30 each 2   colestipol (COLESTID) 1 g tablet TAKE 2 TABLETS(2 GRAMS) BY MOUTH TWICE DAILY 360 tablet 3   diphenoxylate-atropine (LOMOTIL) 2.5-0.025 MG tablet Take 1 tablet by mouth 4 (four) times daily as needed.     epoetin alfa-epbx (RETACRIT) 40981 UNIT/ML injection Inject into the skin.     gabapentin (NEURONTIN) 100 MG capsule TAKE 1 CAPSULE BY MOUTH TWICE DAILY AND 2 AT NIGHT 360 capsule 1   hydrocortisone (ANUSOL-HC) 2.5 % rectal cream Place 1 Application rectally 3  (three) times daily as needed for hemorrhoids or anal itching. 30 g 0   hyoscyamine (LEVSIN SL) 0.125 MG SL tablet DISSOLVE 1 TABLET(0.125 MG) UNDER THE TONGUE EVERY 4 HOURS AS NEEDED 90 tablet 3   lenalidomide (REVLIMID) 5 MG capsule Take 5 mg by mouth daily.     Multiple Vitamin (MULTI-VITAMINS) TABS Take 1 tablet by mouth daily.     ondansetron (ZOFRAN) 8 MG tablet TAKE 1 TABLET(8 MG) BY MOUTH EVERY 8 HOURS AS NEEDED FOR NAUSEA OR VOMITING 20 tablet 0   pantoprazole (PROTONIX) 40 MG tablet Take 1 tablet (40 mg total) by mouth daily. 90 tablet 3   Probiotic Product (ALIGN) 4 MG CAPS Take 1 capsule by mouth daily.     vitamin B-12 (CYANOCOBALAMIN) 1000 MCG tablet Take 1 tablet by mouth daily as needed.     No current facility-administered medications for this visit.   Facility-Administered Medications Ordered in Other Visits  Medication Dose Route Frequency Provider Last Rate Last Admin   0.9 %  sodium chloride infusion  250 mL Intravenous Once Si Gaul, MD        SURGICAL HISTORY:  Past Surgical History:  Procedure Laterality Date   COLONOSCOPY     POLYPECTOMY     PROSTATECTOMY     TONSILLECTOMY      REVIEW OF SYSTEMS:  A comprehensive review of systems was negative except for: Constitutional: positive for fatigue Musculoskeletal: positive for arthralgias Neurological: positive for paresthesia   PHYSICAL EXAMINATION: General appearance: alert, cooperative, appears stated age, fatigued, and no distress Head: Normocephalic, without obvious abnormality, atraumatic Neck: no adenopathy, no JVD, supple, symmetrical, trachea midline, and thyroid not enlarged, symmetric, no tenderness/mass/nodules Lymph nodes: Cervical, supraclavicular, and axillary nodes normal. Resp: clear to auscultation bilaterally Back: symmetric, no curvature. ROM normal. No CVA tenderness. Cardio: regular rate and rhythm, S1, S2 normal, no murmur, click, rub or gallop GI: soft, non-tender; bowel sounds  normal; no masses,  no organomegaly Extremities: extremities normal, atraumatic, no cyanosis or edema  ECOG PERFORMANCE STATUS: 1 - Symptomatic but completely ambulatory  There were no vitals taken for this visit.  LABORATORY DATA:  Lab Results  Component Value Date   WBC 12.8 (H) 08/16/2022   HGB 9.2 (L) 08/16/2022   HCT 29.9 (L) 08/16/2022   MCV 80.6 08/16/2022   PLT 346 08/16/2022      Chemistry      Component Value Date/Time   NA 139 08/16/2022 0844   NA 141 12/08/2016 0928   K 4.6 08/16/2022 0844   K 4.5 12/08/2016 0928   CL 109 08/16/2022 0844   CO2 24 08/16/2022 0844   CO2 27 12/08/2016 0928   BUN 27 (H) 08/16/2022 0844  BUN 14.1 12/08/2016 0928   CREATININE 1.42 (H) 08/16/2022 0844   CREATININE 0.9 12/08/2016 0928      Component Value Date/Time   CALCIUM 9.2 08/16/2022 0844   CALCIUM 9.0 12/08/2016 0928   ALKPHOS 51 08/16/2022 0844   ALKPHOS 87 12/08/2016 0928   AST 14 (L) 08/16/2022 0844   AST 25 12/08/2016 0928   ALT 18 08/16/2022 0844   ALT 41 12/08/2016 0928   BILITOT 1.1 08/16/2022 0844   BILITOT 0.84 12/08/2016 0928      RADIOGRAPHIC STUDIES:  ASSESSMENT AND PLAN:  This is a very pleasant 77 years old white male with essential thrombocythemia with positive JAK-2 mutation as well as myelodysplastic syndrome. He is currently on treatment with Revlimid 5 mg by mouth daily according to the recommendation from [facility].  He is also on treatment with Aranesp every 3 weeks.  Aranesp was a change it to Procrit initially every 2 weeks and currently on weekly basis.  He mentioned that he is feeling much better on Procrit weekly  The patient has been on treatment with Retacrit 40,000 unit weekly for more than 3 years.  The patient has been tolerating his treatment with Retacrit fairly well. I will repeat his blood work today and if his hemoglobin is less than 11, he will proceed with the injection. I will continue to monitor him closely with repeat  follow-up visit in 3 months and lab work. He also has an appointment with Dr. Anise Salvo at [facility] for evaluation and follow-up visit. He was advised to call immediately if he has any other concerning symptoms in the interval.  All questions were answered. The patient knows to call the clinic with any problems, questions or concerns. We can certainly see the patient much sooner if necessary.   Disclaimer: This note was dictated with voice recognition software. Similar sounding words can inadvertently be transcribed and may not be corrected upon review.

## 2022-08-23 NOTE — Telephone Encounter (Signed)
Was approached this morning when I came in Patient was cancelled and wasn't sure why, he had an appointment lab and injection today, after speaking with Cassie rescheduled patient and got him in a slot with Mohammed at 8:15am, Spoke with Shawna Orleans she is aware appointments were scheduled a little different because of the staff meeting, okay per cassie to schedule that way.

## 2022-08-25 ENCOUNTER — Ambulatory Visit: Payer: Medicare Other | Admitting: Neurology

## 2022-08-25 ENCOUNTER — Encounter: Payer: Self-pay | Admitting: Neurology

## 2022-08-25 VITALS — BP 133/61 | HR 69 | Ht 69.0 in | Wt 156.0 lb

## 2022-08-25 DIAGNOSIS — G3184 Mild cognitive impairment, so stated: Secondary | ICD-10-CM

## 2022-08-25 DIAGNOSIS — R2681 Unsteadiness on feet: Secondary | ICD-10-CM | POA: Diagnosis not present

## 2022-08-25 DIAGNOSIS — M5416 Radiculopathy, lumbar region: Secondary | ICD-10-CM

## 2022-08-25 DIAGNOSIS — R202 Paresthesia of skin: Secondary | ICD-10-CM | POA: Diagnosis not present

## 2022-08-25 DIAGNOSIS — G4710 Hypersomnia, unspecified: Secondary | ICD-10-CM

## 2022-08-25 NOTE — Progress Notes (Signed)
Patient: Roy Johnson Date of Birth: 1946-01-06  Reason for Visit: Follow up History from: Patient Primary Neurologist: Terrace Arabia  ASSESSMENT AND PLAN 77 y.o. year old male   1.  Mild cognitive impairment 2.  Right foot paresthesia, right lumbar radiculopathy 3.  Gait unsteadiness, urinary urgency 4.  Excessive daytime sleepiness 5.  Myelodysplastic syndrome  -Will complete labs Dr. Terrace Arabia ordered in November (vitamin D, copper, sed rate, CRP, ANA, B12, RPR) after MRI of cervical spine showed C5-C6 signal abnormality in the medial posterior columns, potentially due to demyelination, B12 or copper deficiency, borderline spinal stenosis at C4-C5 and C5-C6. -MoCA 30/30 -EMG nerve conduction study in January 2022 showed no large fiber peripheral neuropathy  -MRI of the brain November 2022 showed small vessel disease no acute abnormality  -Continue gabapentin up to 100 mg twice daily, 200 mg at bedtime for paresthesia to hands/feet -Encouraged to consider CPAP, reports sleep study was marginal, would encourage a trial of CPAP to see if benefit for the fatigue, memory, daytime sleepiness -If labs unremarkable, review with Dr. Terrace Arabia is repeat MRI cervical spine needed? Repeat EMG upper and lower? -Follow-up in 6 months or sooner if needed  HISTORY  Roy Johnson is a 77 year old male, his primary care physician is Dr. Eloise Harman, Reuel Boom, return to follow-up for cognitive impairment, new onset left visual loss in June 2022   I reviewed and summarized the referring note. PMHx. 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 Shirline Frees,   He was a patient of Dr. Anne Hahn in the past, saw him in December 2021, he retired from Scientist, research (medical) job, Building control surveyor business, Personal assistant, he was in Animal nutritionist, marketing reported gradual onset mild memory loss over the past  2 years, word finding difficulties, short-term memory loss, slower reading and understanding complicated information presented.  Still functioning daily activity, driving without difficulty, able to keep his checkbook imbalance, today's Mini-Mental status 29/30  I personally reviewed MRI of the brain in 2019 which showed no significant abnormalities. Laboratory evaluation showed normal CMP, TSH, B12,   Today his main concern is 1 episode of sudden onset left visual loss in June 2022, lasting for 30 seconds, like a thick mesh covered his left eye, no headache, no lateralized motor or sensory deficit.  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 Epimenio Foot January 2022, no evidence of polyneuropathy, no evidence of active lumbar radiculopathy, mild right carpal tunnel   UPDATE Apr 07 2021: Is overall doing very well, no significant changes since last visit 6 months ago, no longer has sudden visual loss, complains of right lumbar radicular pain, mild gait difficulty due to that  We personally reviewed MRI of brain in November 2022, age-appropriate small vessel disease, no acute abnormality MRA of head and neck showed no large vessel disease,  He continues to complain of occasionally episodes of overwhelming sleepiness, fatigue episode, also complains of mild memory loss,   UPDATE Feb 08 2022: He continue complains of short term memory loss, word finding difficulties, which is a significant change compared to previous baseline, MOCA  examination  28/80 today,   He denies loud snoring, but he tends to sleep on his stomach, complains of daytime sleepiness, required nap often, he also has myelodysplastic syndrome, might contributed to his complaints of fatigue   He is also concerned about mild unsteadiness, has not been physically active, recovered from previous episode of low back pain, right lower extremity radiating pain, he is also concerned about his slow worsening urinary urgency, occasionally accident has to wear pads now, in summer 2023, he suffered frequent diarrhea, stool pathology was positive for C. difficile, norovirus was treated with vancomycin for 2 weeks, repeat C. difficile testing was negative, irritable bowel syndrome, followed by [facility] Dr. Yancey Flemings,  Personally reviewed MRI of the brain without contrast November 2022, mild small vessel disease, age-appropriate atrophy   MRA of brain and neck showed no large vessel disease   He is a caregiver of his wife who suffered Parkinson's disease, this made him very concerned about his health issues to make sure he can take care of his wife  Update Aug 25, 2022 SS: reading the newspaper, today, MOCA 30/30. Hands and feet feel like pins and needles. Hands can be weak, when hurting doesn't want to [place]. His balance is more uncertain, he doesn't fall, loses his equilibrium slightly. Still has urinary urgency. His main concerns are cognitive issues. Mentions last night, slept 8 hours, ready for nap in the morning. Can fall asleep easily. Trouble to visually focus reading, has to force his eyes to focus, unclear why? Has word finding trouble, not noticed today. Mentions fatigue, he can fight through it, but any opportunity to crash he will. He spends [place] in [place], he just drove back. PCP referred for sleep study, was marginal for sleep apnea. At night the tingling in his fingers is bothersome, only takes 100 mg at bedtime, 200 mg makes him sleepy.   MRI cervical spine showed mild  degenerative changes, small signal abnormality at C5-6.  Dr. Terrace Arabia ordered labs that have not been completed yet.  IMPRESSION: This MRI of the cervical spine without contrast shows the following: Adjacent to C5-C6, there is a small focus of T2/STIR hyperintensity in the medial posterior columns.  This is nonspecific and could be due to demyelination, B12 or copper deficiency.  The appearance is less typical for compressive myelopathy. Multilevel degenerative changes as detailed above.  This causes borderline spinal stenosis at C4-C5 and C5-C6.  There is no nerve root compression.  REVIEW OF SYSTEMS: Out of a complete 14 system review of symptoms, the patient complains only of the following symptoms, and all other reviewed systems are negative.  See HPI  ALLERGIES: No Known Allergies  HOME MEDICATIONS: Outpatient Medications Prior to Visit  Medication Sig Dispense Refill   amLODipine (NORVASC) 5 MG tablet Take 5 mg by mouth daily.  2   anagrelide (AGRYLIN) 1 MG capsule TAKE 1 CAPSULE(1 MG) BY MOUTH DAILY 90 capsule 0   cetirizine (ZYRTEC) 10 MG tablet TAKE 1 TABLET(10 MG) BY MOUTH DAILY     cholestyramine (QUESTRAN) 4 g packet MIX AND DRINK 1 PACKET(4 GRAMS) BY MOUTH DAILY 30 each 2   colestipol (COLESTID) 1 g tablet TAKE 2 TABLETS(2 GRAMS) BY MOUTH TWICE DAILY 360 tablet 3   diphenoxylate-atropine (LOMOTIL) 2.5-0.025 MG tablet Take 1 tablet by mouth 4 (four) times daily as needed.  epoetin alfa-epbx (RETACRIT) [address] UNIT/ML injection Inject into the skin.     gabapentin (NEURONTIN) 100 MG capsule TAKE 1 CAPSULE BY MOUTH TWICE DAILY AND 2 AT NIGHT 360 capsule 1   hydrocortisone (ANUSOL-HC) 2.5 % rectal cream Place 1 Application rectally 3 (three) times daily as needed for hemorrhoids or anal itching. 30 g 0   hyoscyamine (LEVSIN SL) 0.125 MG SL tablet DISSOLVE 1 TABLET(0.125 MG) UNDER THE TONGUE EVERY 4 HOURS AS NEEDED 90 tablet 3   lenalidomide (REVLIMID) 5 MG capsule Take 5 mg by mouth  daily.     Multiple Vitamin (MULTI-VITAMINS) TABS Take 1 tablet by mouth daily.     ondansetron (ZOFRAN) 8 MG tablet TAKE 1 TABLET(8 MG) BY MOUTH EVERY 8 HOURS AS NEEDED FOR NAUSEA OR VOMITING 20 tablet 0   pantoprazole (PROTONIX) 40 MG tablet Take 1 tablet (40 mg total) by mouth daily. 90 tablet 3   Probiotic Product (ALIGN) 4 MG CAPS Take 1 capsule by mouth daily.     vitamin B-12 (CYANOCOBALAMIN) 1000 MCG tablet Take 1 tablet by mouth daily as needed.     Facility-Administered Medications Prior to Visit  Medication Dose Route Frequency Provider Last Rate Last Admin   0.9 %  sodium chloride infusion  250 mL Intravenous Once Si Gaul, MD        PAST MEDICAL HISTORY: Past Medical History:  Diagnosis Date   Anemia    Anesthesia of skin    Calculus of gallbladder with acute cholecystitis, with obstruction    Cellulitis of back    Chest pain    Chronic kidney disease (CKD), stage III (moderate) (HCC)    Colon polyp    Cough    Disorientation    Diverticulitis    Diverticulosis    Dyspnea on exertion    Elevated white blood cell count    Fatigue    Forgetfulness    GERD (gastroesophageal reflux disease)    Hearing loss    HTN (hypertension)    Hydrocele    Hyperkalemia    Hyperlipidemia    Hypertensive chronic kidney disease with stage 1 through stage 4 chronic kidney disease, or unspecified chronic kidney disease    Hypokalemia    IBS (irritable bowel syndrome)    Impacted cerumen, bilateral    Inguinal hernia    Insomnia    Lightheadedness    Liver disease    Lower abdominal pain    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   Mixed stress and urge urinary incontinence    Myelodysplastic syndrome (HCC)    Nausea with vomiting    Numbness and tingling in both hands    Other amnesia    Other benign neoplasm of skin, unspecified    Overflow incontinence of urine    Pain in right leg    Paresthesia of skin    Prostate cancer (HCC)    RARS (refractory anemia with ringed  sideroblasts) (HCC)    Right upper quadrant pain    Sinusitis, acute    SOB (shortness of breath)    Thrombocythemia    Umbilical hernia    Vitamin D deficiency    Weakness     PAST SURGICAL HISTORY: Past Surgical History:  Procedure Laterality Date   COLONOSCOPY     POLYPECTOMY     PROSTATECTOMY     TONSILLECTOMY      FAMILY HISTORY: Family History  Problem Relation Age of Onset   Osteoarthritis Mother    Breast cancer Mother  Heart disease Father    Colon polyps Father    Lung cancer Father        cancerous mole   Prostate cancer Father    Colon cancer Neg Hx    Esophageal cancer Neg Hx    Rectal cancer Neg Hx    Stomach cancer Neg Hx    Pancreatic cancer Neg Hx     SOCIAL HISTORY: Social History   Socioeconomic History   Marital status: Married    Spouse name: Not on file   Number of children: 2   Years of education: college   Highest education level: Not on file  Occupational History   Occupation: retired  Tobacco Use   Smoking status: Former    Types: Cigarettes    Quit date: 09/07/1973    Years since quitting: 48.9   Smokeless tobacco: Never  Vaping Use   Vaping Use: Never used  Substance and Sexual Activity   Alcohol use: Not Currently   Drug use: No   Sexual activity: Yes  Other Topics Concern   Not on file  Social History Narrative   Lives with wife.   Right-handed.   1 cup caffeine per day.   Social Determinants of Health   Financial Resource Strain: Not on file  Food Insecurity: Not on file  Transportation Needs: Not on file  Physical Activity: Not on file  Stress: Not on file  Social Connections: Not on file  Intimate Partner Violence: Not on file    PHYSICAL EXAM  Vitals:   08/25/22 1312  BP: 133/61  Pulse: 69  Weight: 156 lb (70.8 kg)  Height: 5\' 9"  (1.753 m)   Body mass index is 23.04 kg/m.    02/08/2022   11:29 AM 04/07/2021    4:00 PM  Montreal Cognitive Assessment   Visuospatial/ Executive (0/5) 5 5   Naming (0/3) 3 3  Attention: Read list of digits (0/2) 2 2  Attention: Read list of letters (0/1) 1 1  Attention: Serial 7 subtraction starting at 100 (0/3) 3 3  Language: Repeat phrase (0/2) 2 2  Language : Fluency (0/1) 1 1  Abstraction (0/2) 2 2  Delayed Recall (0/5) 3 4  Orientation (0/6) 6 6  Total 28 29  Adjusted Score (based on education) 28    Generalized: Well developed, in no acute distress  Neurological examination  Mentation: Alert oriented to time, place, history taking. Follows all commands speech and language fluent Cranial nerve II-XII: Pupils were equal round reactive to light. Extraocular movements were full, visual field were full on confrontational test. Facial sensation and strength were normal. Head turning and shoulder shrug  were normal and symmetric. Motor: The motor testing reveals 5 over 5 strength of all 4 extremities. Good symmetric motor tone is noted throughout.  Sensory: Sensory testing is intact to soft touch on all 4 extremities. No evidence of extinction is noted.  Coordination: Cerebellar testing reveals good finger-nose-finger and heel-to-shin bilaterally.  Gait and station: Gait is normal. Tandem gait is steady. Reflexes: Deep tendon reflexes are symmetric, slightly increased at the knees  DIAGNOSTIC DATA (LABS, IMAGING, TESTING) - I reviewed patient records, labs, notes, testing and imaging myself where available.  Lab Results  Component Value Date   WBC 19.5 (H) 08/23/2022   HGB 10.0 (L) 08/23/2022   HCT 32.9 (L) 08/23/2022   MCV 84.6 08/23/2022   PLT 449 (H) 08/23/2022      Component Value Date/Time   NA 141 08/23/2022 1610  NA 141 12/08/2016 0928   K 4.7 08/23/2022 0838   K 4.5 12/08/2016 0928   CL 109 08/23/2022 0838   CO2 26 08/23/2022 0838   CO2 27 12/08/2016 0928   GLUCOSE 90 08/23/2022 0838   GLUCOSE 150 (H) 12/08/2016 0928   BUN 25 (H) 08/23/2022 0838   BUN 14.1 12/08/2016 0928   CREATININE 1.34 (H) 08/23/2022 0838    CREATININE 0.9 12/08/2016 0928   CALCIUM 9.3 08/23/2022 0838   CALCIUM 9.0 12/08/2016 0928   PROT 7.4 08/23/2022 0838   PROT 7.3 04/01/2020 0954   PROT 6.6 12/08/2016 0928   ALBUMIN 4.7 08/23/2022 0838   ALBUMIN 3.7 12/08/2016 0928   AST 11 (L) 08/23/2022 0838   AST 25 12/08/2016 0928   ALT 14 08/23/2022 0838   ALT 41 12/08/2016 0928   ALKPHOS 52 08/23/2022 0838   ALKPHOS 87 12/08/2016 0928   BILITOT 0.9 08/23/2022 0838   BILITOT 0.84 12/08/2016 0928   GFRNONAA 55 (L) 08/23/2022 0838   GFRAA >60 01/13/2020 0929   No results found for: "CHOL", "HDL", "LDLCALC", "LDLDIRECT", "TRIG", "CHOLHDL" No results found for: "HGBA1C" Lab Results  Component Value Date   VITAMINB12 1,613 (H) 04/07/2021   Lab Results  Component Value Date   TSH 2.440 04/07/2021    Margie Ege, AGNP-C, DNP 08/25/2022, 2:48 PM [facility] [address], [address] [place], [place] [address] [phone]

## 2022-08-25 NOTE — Patient Instructions (Signed)
Check labs Check into if CPAP if needed

## 2022-08-26 LAB — ANA W/REFLEX IF POSITIVE: Anti Nuclear Antibody (ANA): NEGATIVE

## 2022-08-26 LAB — SEDIMENTATION RATE: Sed Rate: 7 mm/hr (ref 0–30)

## 2022-08-26 LAB — VITAMIN E

## 2022-08-27 LAB — C-REACTIVE PROTEIN: CRP: 1 mg/L (ref 0–10)

## 2022-08-27 LAB — COPPER, SERUM: Copper: 74 ug/dL (ref 69–132)

## 2022-08-27 LAB — VITAMIN E

## 2022-08-29 LAB — VITAMIN B12: Vitamin B-12: 1518 pg/mL — ABNORMAL HIGH (ref 232–1245)

## 2022-08-29 LAB — RPR: RPR Ser Ql: NONREACTIVE

## 2022-08-30 ENCOUNTER — Inpatient Hospital Stay: Payer: Medicare Other

## 2022-08-30 ENCOUNTER — Other Ambulatory Visit: Payer: Self-pay

## 2022-08-30 VITALS — BP 143/75 | HR 77

## 2022-08-30 DIAGNOSIS — D469 Myelodysplastic syndrome, unspecified: Secondary | ICD-10-CM

## 2022-08-30 DIAGNOSIS — D461 Refractory anemia with ring sideroblasts: Secondary | ICD-10-CM | POA: Diagnosis not present

## 2022-08-30 LAB — COMPREHENSIVE METABOLIC PANEL
ALT: 13 U/L (ref 0–44)
AST: 11 U/L — ABNORMAL LOW (ref 15–41)
Albumin: 4.5 g/dL (ref 3.5–5.0)
Alkaline Phosphatase: 63 U/L (ref 38–126)
Anion gap: 5 (ref 5–15)
BUN: 22 mg/dL (ref 8–23)
CO2: 27 mmol/L (ref 22–32)
Calcium: 9.2 mg/dL (ref 8.9–10.3)
Chloride: 106 mmol/L (ref 98–111)
Creatinine, Ser: 1.22 mg/dL (ref 0.61–1.24)
GFR, Estimated: >60 mL/min (ref >60)
Glucose, Bld: 99 mg/dL (ref 70–99)
Potassium: 4.6 mmol/L (ref 3.5–5.1)
Sodium: 138 mmol/L (ref 135–145)
Total Bilirubin: 0.7 mg/dL (ref 0.3–1.2)
Total Protein: 7.3 g/dL (ref 6.5–8.1)

## 2022-08-30 LAB — CBC WITH DIFFERENTIAL/PLATELET
Abs Immature Granulocytes: 0.44 10*3/uL — ABNORMAL HIGH (ref 0.00–0.07)
Basophils Absolute: 0.2 10*3/uL — ABNORMAL HIGH (ref 0.0–0.1)
Basophils Relative: 1 %
Eosinophils Absolute: 1.3 10*3/uL — ABNORMAL HIGH (ref 0.0–0.5)
Eosinophils Relative: 8 %
HCT: 32.6 % — ABNORMAL LOW (ref 39.0–52.0)
Hemoglobin: 10.2 g/dL — ABNORMAL LOW (ref 13.0–17.0)
Immature Granulocytes: 3 %
Lymphocytes Relative: 12 %
Lymphs Abs: 2.1 10*3/uL (ref 0.7–4.0)
MCH: 25.4 pg — ABNORMAL LOW (ref 26.0–34.0)
MCHC: 31.3 g/dL (ref 30.0–36.0)
MCV: 81.1 fL (ref 80.0–100.0)
Monocytes Absolute: 1.1 10*3/uL — ABNORMAL HIGH (ref 0.1–1.0)
Monocytes Relative: 6 %
Neutro Abs: 12.1 10*3/uL — ABNORMAL HIGH (ref 1.7–7.7)
Neutrophils Relative %: 70 %
Platelets: 267 10*3/uL (ref 150–400)
RBC: 4.02 MIL/uL — ABNORMAL LOW (ref 4.22–5.81)
RDW: 35.7 % — ABNORMAL HIGH (ref 11.5–15.5)
WBC: 17.2 10*3/uL — ABNORMAL HIGH (ref 4.0–10.5)
nRBC: 0.1 % (ref 0.0–0.2)

## 2022-08-30 MED ORDER — EPOETIN ALFA-EPBX 40000 UNIT/ML IJ SOLN
40000.0000 [IU] | Freq: Once | INTRAMUSCULAR | Status: AC
  Administered 2022-08-30: 40000 [IU] via SUBCUTANEOUS
  Filled 2022-08-30: qty 1

## 2022-08-30 NOTE — Progress Notes (Signed)
Chart reviewed, agree above plan ?

## 2022-08-30 NOTE — Patient Instructions (Signed)

## 2022-09-06 ENCOUNTER — Other Ambulatory Visit: Payer: Medicare Other

## 2022-09-06 ENCOUNTER — Ambulatory Visit: Payer: Medicare Other

## 2022-09-06 ENCOUNTER — Encounter: Payer: Self-pay | Admitting: Internal Medicine

## 2022-09-07 ENCOUNTER — Inpatient Hospital Stay: Payer: Medicare Other

## 2022-09-07 VITALS — BP 127/73 | HR 65 | Temp 97.9°F | Resp 16

## 2022-09-07 DIAGNOSIS — D461 Refractory anemia with ring sideroblasts: Secondary | ICD-10-CM | POA: Diagnosis not present

## 2022-09-07 DIAGNOSIS — D469 Myelodysplastic syndrome, unspecified: Secondary | ICD-10-CM

## 2022-09-07 LAB — COMPREHENSIVE METABOLIC PANEL
ALT: 21 U/L (ref 0–44)
AST: 13 U/L — ABNORMAL LOW (ref 15–41)
Albumin: 4.2 g/dL (ref 3.5–5.0)
Alkaline Phosphatase: 52 U/L (ref 38–126)
Anion gap: 4 — ABNORMAL LOW (ref 5–15)
BUN: 28 mg/dL — ABNORMAL HIGH (ref 8–23)
CO2: 27 mmol/L (ref 22–32)
Calcium: 8.7 mg/dL — ABNORMAL LOW (ref 8.9–10.3)
Chloride: 107 mmol/L (ref 98–111)
Creatinine, Ser: 1.36 mg/dL — ABNORMAL HIGH (ref 0.61–1.24)
GFR, Estimated: 54 mL/min — ABNORMAL LOW (ref >60)
Glucose, Bld: 95 mg/dL (ref 70–99)
Potassium: 4.8 mmol/L (ref 3.5–5.1)
Sodium: 138 mmol/L (ref 135–145)
Total Bilirubin: 0.8 mg/dL (ref 0.3–1.2)
Total Protein: 7.1 g/dL (ref 6.5–8.1)

## 2022-09-07 LAB — CBC WITH DIFFERENTIAL/PLATELET
Abs Immature Granulocytes: 0.12 10*3/uL — ABNORMAL HIGH (ref 0.00–0.07)
Basophils Absolute: 0.2 10*3/uL — ABNORMAL HIGH (ref 0.0–0.1)
Basophils Relative: 2 %
Eosinophils Absolute: 1.3 10*3/uL — ABNORMAL HIGH (ref 0.0–0.5)
Eosinophils Relative: 13 %
HCT: 30.6 % — ABNORMAL LOW (ref 39.0–52.0)
Hemoglobin: 9.6 g/dL — ABNORMAL LOW (ref 13.0–17.0)
Immature Granulocytes: 1 %
Lymphocytes Relative: 21 %
Lymphs Abs: 2.2 10*3/uL (ref 0.7–4.0)
MCH: 25.7 pg — ABNORMAL LOW (ref 26.0–34.0)
MCHC: 31.4 g/dL (ref 30.0–36.0)
MCV: 81.8 fL (ref 80.0–100.0)
Monocytes Absolute: 0.6 10*3/uL (ref 0.1–1.0)
Monocytes Relative: 6 %
Neutro Abs: 5.9 10*3/uL (ref 1.7–7.7)
Neutrophils Relative %: 57 %
Platelets: 230 10*3/uL (ref 150–400)
RBC: 3.74 MIL/uL — ABNORMAL LOW (ref 4.22–5.81)
RDW: 34.9 % — ABNORMAL HIGH (ref 11.5–15.5)
WBC: 10.4 10*3/uL (ref 4.0–10.5)
nRBC: 0.2 % (ref 0.0–0.2)

## 2022-09-07 MED ORDER — EPOETIN ALFA-EPBX 40000 UNIT/ML IJ SOLN
40000.0000 [IU] | Freq: Once | INTRAMUSCULAR | Status: AC
  Administered 2022-09-07: 40000 [IU] via SUBCUTANEOUS
  Filled 2022-09-07: qty 1

## 2022-09-14 ENCOUNTER — Inpatient Hospital Stay: Payer: Medicare Other

## 2022-09-14 ENCOUNTER — Inpatient Hospital Stay: Payer: Medicare Other | Attending: Internal Medicine

## 2022-09-14 VITALS — BP 128/72 | HR 69 | Temp 97.7°F | Resp 16

## 2022-09-14 DIAGNOSIS — D469 Myelodysplastic syndrome, unspecified: Secondary | ICD-10-CM

## 2022-09-14 DIAGNOSIS — Z79899 Other long term (current) drug therapy: Secondary | ICD-10-CM | POA: Insufficient documentation

## 2022-09-14 DIAGNOSIS — D461 Refractory anemia with ring sideroblasts: Secondary | ICD-10-CM | POA: Insufficient documentation

## 2022-09-14 DIAGNOSIS — D72829 Elevated white blood cell count, unspecified: Secondary | ICD-10-CM | POA: Insufficient documentation

## 2022-09-14 LAB — CBC WITH DIFFERENTIAL/PLATELET
Abs Immature Granulocytes: 0.52 10*3/uL — ABNORMAL HIGH (ref 0.00–0.07)
Basophils Absolute: 0.4 10*3/uL — ABNORMAL HIGH (ref 0.0–0.1)
Basophils Relative: 3 %
Eosinophils Absolute: 0.6 10*3/uL — ABNORMAL HIGH (ref 0.0–0.5)
Eosinophils Relative: 4 %
HCT: 29.6 % — ABNORMAL LOW (ref 39.0–52.0)
Hemoglobin: 9.2 g/dL — ABNORMAL LOW (ref 13.0–17.0)
Immature Granulocytes: 4 %
Lymphocytes Relative: 15 %
Lymphs Abs: 2 10*3/uL (ref 0.7–4.0)
MCH: 25.6 pg — ABNORMAL LOW (ref 26.0–34.0)
MCHC: 31.1 g/dL (ref 30.0–36.0)
MCV: 82.2 fL (ref 80.0–100.0)
Monocytes Absolute: 1.1 10*3/uL — ABNORMAL HIGH (ref 0.1–1.0)
Monocytes Relative: 8 %
Neutro Abs: 9 10*3/uL — ABNORMAL HIGH (ref 1.7–7.7)
Neutrophils Relative %: 66 %
Platelets: 360 10*3/uL (ref 150–400)
RBC: 3.6 MIL/uL — ABNORMAL LOW (ref 4.22–5.81)
RDW: 35.1 % — ABNORMAL HIGH (ref 11.5–15.5)
WBC: 13.5 10*3/uL — ABNORMAL HIGH (ref 4.0–10.5)
nRBC: 0.5 % — ABNORMAL HIGH (ref 0.0–0.2)

## 2022-09-14 LAB — COMPREHENSIVE METABOLIC PANEL
ALT: 15 U/L (ref 0–44)
AST: 9 U/L — ABNORMAL LOW (ref 15–41)
Albumin: 4.4 g/dL (ref 3.5–5.0)
Alkaline Phosphatase: 58 U/L (ref 38–126)
Anion gap: 6 (ref 5–15)
BUN: 23 mg/dL (ref 8–23)
CO2: 25 mmol/L (ref 22–32)
Calcium: 9.6 mg/dL (ref 8.9–10.3)
Chloride: 110 mmol/L (ref 98–111)
Creatinine, Ser: 1.29 mg/dL — ABNORMAL HIGH (ref 0.61–1.24)
GFR, Estimated: 57 mL/min — ABNORMAL LOW (ref >60)
Glucose, Bld: 104 mg/dL — ABNORMAL HIGH (ref 70–99)
Potassium: 4.6 mmol/L (ref 3.5–5.1)
Sodium: 141 mmol/L (ref 135–145)
Total Bilirubin: 0.7 mg/dL (ref 0.3–1.2)
Total Protein: 7.3 g/dL (ref 6.5–8.1)

## 2022-09-14 MED ORDER — EPOETIN ALFA-EPBX 40000 UNIT/ML IJ SOLN
40000.0000 [IU] | Freq: Once | INTRAMUSCULAR | Status: AC
  Administered 2022-09-14: 40000 [IU] via SUBCUTANEOUS
  Filled 2022-09-14: qty 1

## 2022-09-20 ENCOUNTER — Inpatient Hospital Stay: Payer: Medicare Other

## 2022-09-20 ENCOUNTER — Other Ambulatory Visit: Payer: Self-pay

## 2022-09-20 VITALS — BP 137/64 | HR 65 | Temp 97.6°F | Resp 18

## 2022-09-20 DIAGNOSIS — D461 Refractory anemia with ring sideroblasts: Secondary | ICD-10-CM | POA: Diagnosis not present

## 2022-09-20 DIAGNOSIS — D469 Myelodysplastic syndrome, unspecified: Secondary | ICD-10-CM

## 2022-09-20 LAB — CBC WITH DIFFERENTIAL/PLATELET
Abs Immature Granulocytes: 1.39 10*3/uL — ABNORMAL HIGH (ref 0.00–0.07)
Basophils Absolute: 0.4 10*3/uL — ABNORMAL HIGH (ref 0.0–0.1)
Basophils Relative: 2 %
Eosinophils Absolute: 0.7 10*3/uL — ABNORMAL HIGH (ref 0.0–0.5)
Eosinophils Relative: 3 %
HCT: 31 % — ABNORMAL LOW (ref 39.0–52.0)
Hemoglobin: 9.7 g/dL — ABNORMAL LOW (ref 13.0–17.0)
Immature Granulocytes: 7 %
Lymphocytes Relative: 12 %
Lymphs Abs: 2.6 10*3/uL (ref 0.7–4.0)
MCH: 26 pg (ref 26.0–34.0)
MCHC: 31.3 g/dL (ref 30.0–36.0)
MCV: 83.1 fL (ref 80.0–100.0)
Monocytes Absolute: 0.8 10*3/uL (ref 0.1–1.0)
Monocytes Relative: 4 %
Neutro Abs: 15.1 10*3/uL — ABNORMAL HIGH (ref 1.7–7.7)
Neutrophils Relative %: 72 %
Platelets: 325 10*3/uL (ref 150–400)
RBC: 3.73 MIL/uL — ABNORMAL LOW (ref 4.22–5.81)
RDW: 34.9 % — ABNORMAL HIGH (ref 11.5–15.5)
WBC: 21 10*3/uL — ABNORMAL HIGH (ref 4.0–10.5)
nRBC: 0.5 % — ABNORMAL HIGH (ref 0.0–0.2)

## 2022-09-20 LAB — COMPREHENSIVE METABOLIC PANEL
ALT: 15 U/L (ref 0–44)
AST: 14 U/L — ABNORMAL LOW (ref 15–41)
Albumin: 4.5 g/dL (ref 3.5–5.0)
Alkaline Phosphatase: 55 U/L (ref 38–126)
Anion gap: 6 (ref 5–15)
BUN: 26 mg/dL — ABNORMAL HIGH (ref 8–23)
CO2: 25 mmol/L (ref 22–32)
Calcium: 9.6 mg/dL (ref 8.9–10.3)
Chloride: 109 mmol/L (ref 98–111)
Creatinine, Ser: 1.35 mg/dL — ABNORMAL HIGH (ref 0.61–1.24)
GFR, Estimated: 54 mL/min — ABNORMAL LOW (ref >60)
Glucose, Bld: 99 mg/dL (ref 70–99)
Potassium: 4.4 mmol/L (ref 3.5–5.1)
Sodium: 140 mmol/L (ref 135–145)
Total Bilirubin: 1 mg/dL (ref 0.3–1.2)
Total Protein: 7.4 g/dL (ref 6.5–8.1)

## 2022-09-20 MED ORDER — EPOETIN ALFA-EPBX 40000 UNIT/ML IJ SOLN
40000.0000 [IU] | Freq: Once | INTRAMUSCULAR | Status: AC
  Administered 2022-09-20: 40000 [IU] via SUBCUTANEOUS
  Filled 2022-09-20: qty 1

## 2022-09-21 ENCOUNTER — Inpatient Hospital Stay: Payer: Medicare Other

## 2022-09-27 ENCOUNTER — Inpatient Hospital Stay: Payer: Medicare Other

## 2022-09-27 ENCOUNTER — Telehealth: Payer: Self-pay | Admitting: Medical Oncology

## 2022-09-27 ENCOUNTER — Other Ambulatory Visit: Payer: Self-pay

## 2022-09-27 VITALS — BP 146/75 | HR 61 | Temp 97.8°F | Resp 16

## 2022-09-27 DIAGNOSIS — D469 Myelodysplastic syndrome, unspecified: Secondary | ICD-10-CM

## 2022-09-27 DIAGNOSIS — D461 Refractory anemia with ring sideroblasts: Secondary | ICD-10-CM | POA: Diagnosis not present

## 2022-09-27 LAB — CBC WITH DIFFERENTIAL/PLATELET
Abs Immature Granulocytes: 0.8 10*3/uL — ABNORMAL HIGH (ref 0.00–0.07)
Basophils Absolute: 0.2 10*3/uL — ABNORMAL HIGH (ref 0.0–0.1)
Basophils Relative: 1 %
Eosinophils Absolute: 1.6 10*3/uL — ABNORMAL HIGH (ref 0.0–0.5)
Eosinophils Relative: 9 %
HCT: 31.8 % — ABNORMAL LOW (ref 39.0–52.0)
Hemoglobin: 9.9 g/dL — ABNORMAL LOW (ref 13.0–17.0)
Immature Granulocytes: 4 %
Lymphocytes Relative: 14 %
Lymphs Abs: 2.4 10*3/uL (ref 0.7–4.0)
MCH: 26.2 pg (ref 26.0–34.0)
MCHC: 31.1 g/dL (ref 30.0–36.0)
MCV: 84.1 fL (ref 80.0–100.0)
Monocytes Absolute: 0.7 10*3/uL (ref 0.1–1.0)
Monocytes Relative: 4 %
Neutro Abs: 12.2 10*3/uL — ABNORMAL HIGH (ref 1.7–7.7)
Neutrophils Relative %: 68 %
Platelets: 366 10*3/uL (ref 150–400)
RBC: 3.78 MIL/uL — ABNORMAL LOW (ref 4.22–5.81)
RDW: 34.8 % — ABNORMAL HIGH (ref 11.5–15.5)
WBC: 18 10*3/uL — ABNORMAL HIGH (ref 4.0–10.5)
nRBC: 0.1 % (ref 0.0–0.2)

## 2022-09-27 LAB — COMPREHENSIVE METABOLIC PANEL
ALT: 14 U/L (ref 0–44)
AST: 13 U/L — ABNORMAL LOW (ref 15–41)
Albumin: 4 g/dL (ref 3.5–5.0)
Alkaline Phosphatase: 53 U/L (ref 38–126)
Anion gap: 5 (ref 5–15)
BUN: 22 mg/dL (ref 8–23)
CO2: 25 mmol/L (ref 22–32)
Calcium: 8.8 mg/dL — ABNORMAL LOW (ref 8.9–10.3)
Chloride: 108 mmol/L (ref 98–111)
Creatinine, Ser: 1.15 mg/dL (ref 0.61–1.24)
GFR, Estimated: >60 mL/min (ref >60)
Glucose, Bld: 97 mg/dL (ref 70–99)
Potassium: 4.5 mmol/L (ref 3.5–5.1)
Sodium: 138 mmol/L (ref 135–145)
Total Bilirubin: 0.7 mg/dL (ref 0.3–1.2)
Total Protein: 6.5 g/dL (ref 6.5–8.1)

## 2022-09-27 MED ORDER — EPOETIN ALFA-EPBX 40000 UNIT/ML IJ SOLN
40000.0000 [IU] | Freq: Once | INTRAMUSCULAR | Status: AC
  Administered 2022-09-27: 40000 [IU] via SUBCUTANEOUS
  Filled 2022-09-27: qty 1

## 2022-09-27 NOTE — Telephone Encounter (Signed)
He wants his HGB run asap for the injection if needed. He has to wait an hour to get results because the lab counts the platelets then releases total cbc/diff. This extends his time here.Per Shanda Bumps in lab call  the morning of lab appt tand ask the techs to report hgb before counting plts.

## 2022-09-27 NOTE — Telephone Encounter (Signed)
I told pt that he needs to remind lab to report HGB before counting plts.

## 2022-09-28 ENCOUNTER — Inpatient Hospital Stay: Payer: Medicare Other

## 2022-10-04 ENCOUNTER — Inpatient Hospital Stay: Payer: Medicare Other

## 2022-10-04 ENCOUNTER — Telehealth: Payer: Self-pay | Admitting: Medical Oncology

## 2022-10-04 ENCOUNTER — Other Ambulatory Visit: Payer: Self-pay

## 2022-10-04 VITALS — BP 143/72 | HR 63 | Temp 97.6°F | Resp 20

## 2022-10-04 DIAGNOSIS — D461 Refractory anemia with ring sideroblasts: Secondary | ICD-10-CM | POA: Diagnosis not present

## 2022-10-04 DIAGNOSIS — D469 Myelodysplastic syndrome, unspecified: Secondary | ICD-10-CM

## 2022-10-04 LAB — CBC WITH DIFFERENTIAL/PLATELET
Abs Immature Granulocytes: 0.18 10*3/uL — ABNORMAL HIGH (ref 0.00–0.07)
Basophils Absolute: 0.2 10*3/uL — ABNORMAL HIGH (ref 0.0–0.1)
Basophils Relative: 2 %
Eosinophils Absolute: 1.7 10*3/uL — ABNORMAL HIGH (ref 0.0–0.5)
Eosinophils Relative: 13 %
HCT: 32 % — ABNORMAL LOW (ref 39.0–52.0)
Hemoglobin: 9.8 g/dL — ABNORMAL LOW (ref 13.0–17.0)
Immature Granulocytes: 1 %
Lymphocytes Relative: 15 %
Lymphs Abs: 2 10*3/uL (ref 0.7–4.0)
MCH: 25.3 pg — ABNORMAL LOW (ref 26.0–34.0)
MCHC: 30.6 g/dL (ref 30.0–36.0)
MCV: 82.5 fL (ref 80.0–100.0)
Monocytes Absolute: 1 10*3/uL (ref 0.1–1.0)
Monocytes Relative: 7 %
Neutro Abs: 8.6 10*3/uL — ABNORMAL HIGH (ref 1.7–7.7)
Neutrophils Relative %: 62 %
Platelets: 228 10*3/uL (ref 150–400)
RBC: 3.88 MIL/uL — ABNORMAL LOW (ref 4.22–5.81)
RDW: 35.9 % — ABNORMAL HIGH (ref 11.5–15.5)
WBC: 13.8 10*3/uL — ABNORMAL HIGH (ref 4.0–10.5)
nRBC: 0 % (ref 0.0–0.2)

## 2022-10-04 LAB — COMPREHENSIVE METABOLIC PANEL
ALT: 17 U/L (ref 0–44)
AST: 12 U/L — ABNORMAL LOW (ref 15–41)
Albumin: 4.1 g/dL (ref 3.5–5.0)
Alkaline Phosphatase: 51 U/L (ref 38–126)
Anion gap: 5 (ref 5–15)
BUN: 19 mg/dL (ref 8–23)
CO2: 25 mmol/L (ref 22–32)
Calcium: 9.2 mg/dL (ref 8.9–10.3)
Chloride: 109 mmol/L (ref 98–111)
Creatinine, Ser: 1.21 mg/dL (ref 0.61–1.24)
GFR, Estimated: >60 mL/min (ref >60)
Glucose, Bld: 94 mg/dL (ref 70–99)
Potassium: 4.7 mmol/L (ref 3.5–5.1)
Sodium: 139 mmol/L (ref 135–145)
Total Bilirubin: 0.7 mg/dL (ref 0.3–1.2)
Total Protein: 7.1 g/dL (ref 6.5–8.1)

## 2022-10-04 MED ORDER — EPOETIN ALFA-EPBX 40000 UNIT/ML IJ SOLN
40000.0000 [IU] | Freq: Once | INTRAMUSCULAR | Status: AC
  Administered 2022-10-04: 40000 [IU] via SUBCUTANEOUS
  Filled 2022-10-04: qty 1

## 2022-10-04 NOTE — Telephone Encounter (Signed)
Per Lauren pt hgb is 9.8.

## 2022-10-05 ENCOUNTER — Inpatient Hospital Stay: Payer: Medicare Other

## 2022-10-06 ENCOUNTER — Encounter: Payer: Self-pay | Admitting: Medical Oncology

## 2022-10-06 ENCOUNTER — Telehealth: Payer: Self-pay | Admitting: Medical Oncology

## 2022-10-06 NOTE — Telephone Encounter (Signed)
Email sent out to pt re lab .

## 2022-10-06 NOTE — Telephone Encounter (Signed)
Does the lab need to count his platelets ? It delays reporting his hgb by an hour and so he waits ... To know whether or not he needs an injection.

## 2022-10-08 ENCOUNTER — Encounter: Payer: Self-pay | Admitting: Medical Oncology

## 2022-10-11 ENCOUNTER — Inpatient Hospital Stay: Payer: Medicare Other | Attending: Internal Medicine

## 2022-10-11 ENCOUNTER — Encounter: Payer: Self-pay | Admitting: Internal Medicine

## 2022-10-11 ENCOUNTER — Inpatient Hospital Stay: Payer: Medicare Other

## 2022-10-11 ENCOUNTER — Telehealth: Payer: Self-pay | Admitting: Medical Oncology

## 2022-10-11 ENCOUNTER — Other Ambulatory Visit: Payer: Self-pay

## 2022-10-11 VITALS — BP 133/81 | HR 74 | Temp 98.1°F | Resp 18

## 2022-10-11 DIAGNOSIS — D72829 Elevated white blood cell count, unspecified: Secondary | ICD-10-CM | POA: Insufficient documentation

## 2022-10-11 DIAGNOSIS — D469 Myelodysplastic syndrome, unspecified: Secondary | ICD-10-CM

## 2022-10-11 DIAGNOSIS — Z79899 Other long term (current) drug therapy: Secondary | ICD-10-CM | POA: Insufficient documentation

## 2022-10-11 DIAGNOSIS — D461 Refractory anemia with ring sideroblasts: Secondary | ICD-10-CM | POA: Diagnosis present

## 2022-10-11 LAB — CBC WITH DIFFERENTIAL/PLATELET
Abs Immature Granulocytes: 0.23 10*3/uL — ABNORMAL HIGH (ref 0.00–0.07)
Basophils Absolute: 0.3 10*3/uL — ABNORMAL HIGH (ref 0.0–0.1)
Basophils Relative: 2 %
Eosinophils Absolute: 0.6 10*3/uL — ABNORMAL HIGH (ref 0.0–0.5)
Eosinophils Relative: 6 %
HCT: 30.3 % — ABNORMAL LOW (ref 39.0–52.0)
Hemoglobin: 9.6 g/dL — ABNORMAL LOW (ref 13.0–17.0)
Immature Granulocytes: 2 %
Lymphocytes Relative: 19 %
Lymphs Abs: 2 10*3/uL (ref 0.7–4.0)
MCH: 26.4 pg (ref 26.0–34.0)
MCHC: 31.7 g/dL (ref 30.0–36.0)
MCV: 83.2 fL (ref 80.0–100.0)
Monocytes Absolute: 0.8 10*3/uL (ref 0.1–1.0)
Monocytes Relative: 7 %
Neutro Abs: 6.7 10*3/uL (ref 1.7–7.7)
Neutrophils Relative %: 64 %
Platelets: 306 10*3/uL (ref 150–400)
RBC: 3.64 MIL/uL — ABNORMAL LOW (ref 4.22–5.81)
RDW: 34.8 % — ABNORMAL HIGH (ref 11.5–15.5)
WBC: 10.6 10*3/uL — ABNORMAL HIGH (ref 4.0–10.5)
nRBC: 0.4 % — ABNORMAL HIGH (ref 0.0–0.2)

## 2022-10-11 LAB — COMPREHENSIVE METABOLIC PANEL
ALT: 19 U/L (ref 0–44)
AST: 14 U/L — ABNORMAL LOW (ref 15–41)
Albumin: 4.1 g/dL (ref 3.5–5.0)
Alkaline Phosphatase: 52 U/L (ref 38–126)
Anion gap: 7 (ref 5–15)
BUN: 23 mg/dL (ref 8–23)
CO2: 25 mmol/L (ref 22–32)
Calcium: 8.8 mg/dL — ABNORMAL LOW (ref 8.9–10.3)
Chloride: 110 mmol/L (ref 98–111)
Creatinine, Ser: 1.16 mg/dL (ref 0.61–1.24)
GFR, Estimated: >60 mL/min (ref >60)
Glucose, Bld: 84 mg/dL (ref 70–99)
Potassium: 4.1 mmol/L (ref 3.5–5.1)
Sodium: 142 mmol/L (ref 135–145)
Total Bilirubin: 0.8 mg/dL (ref 0.3–1.2)
Total Protein: 6.6 g/dL (ref 6.5–8.1)

## 2022-10-11 MED ORDER — EPOETIN ALFA-EPBX 40000 UNIT/ML IJ SOLN
40000.0000 [IU] | Freq: Once | INTRAMUSCULAR | Status: AC
  Administered 2022-10-11: 40000 [IU] via SUBCUTANEOUS
  Filled 2022-10-11: qty 1

## 2022-10-11 NOTE — Telephone Encounter (Addendum)
Per Murvin Donning , Lab , pt hgb is 9.6 today.

## 2022-10-12 ENCOUNTER — Inpatient Hospital Stay: Payer: Medicare Other

## 2022-10-12 IMAGING — US US ABDOMEN COMPLETE
1 series · 14 of 25 positions shown · non-contrast
Comparison: CT abdomen and pelvis 09/25/2018

CLINICAL DATA: Epigastric abdominal pain for years

EXAM:
ABDOMEN ULTRASOUND COMPLETE

[Series 1: us abdomen complete · 0.22mm/px · 14 of 101 slices shown]
[im 1/101]
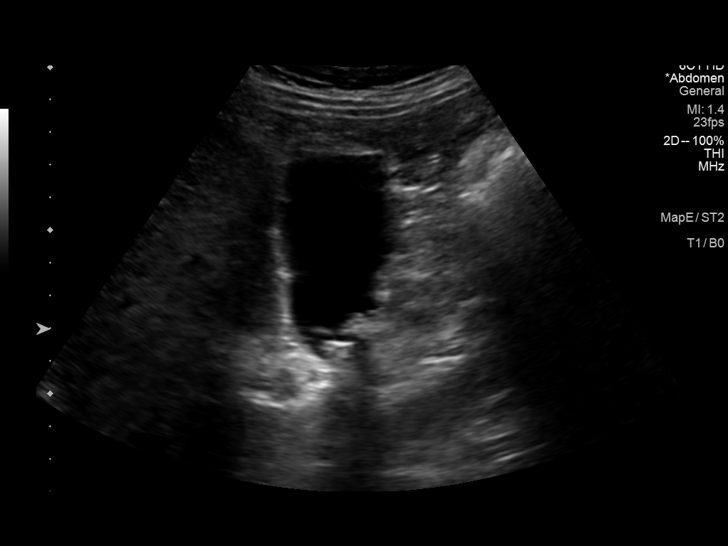
[im 9/101]
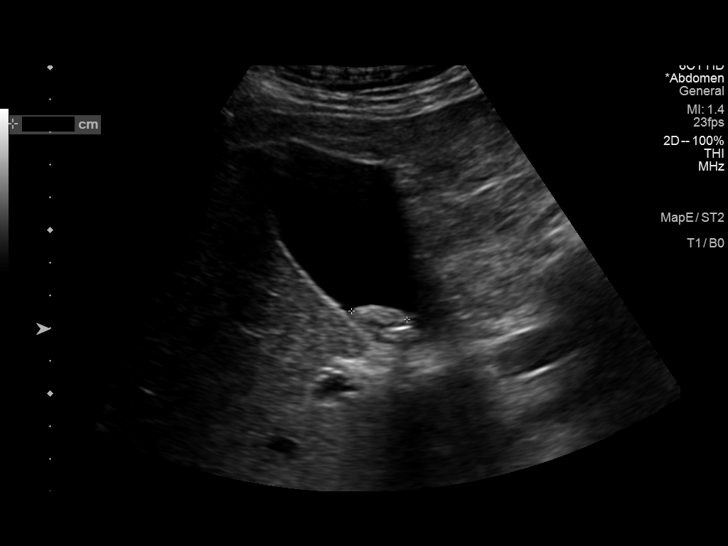
[im 17/101]
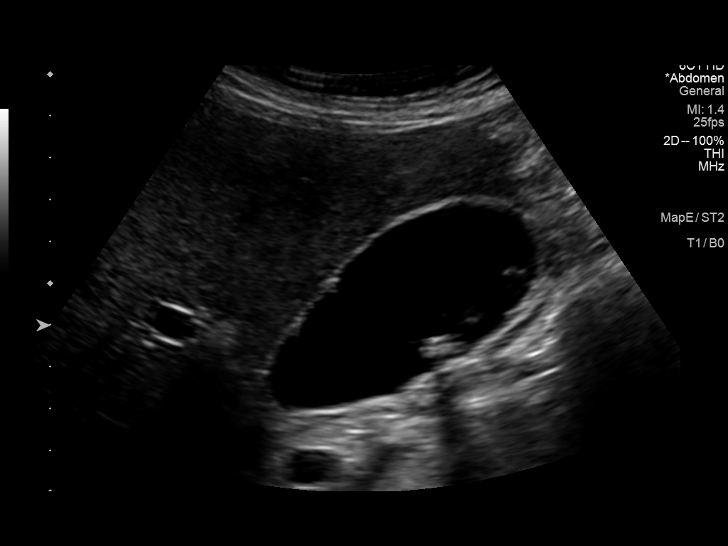
[im 26/101]
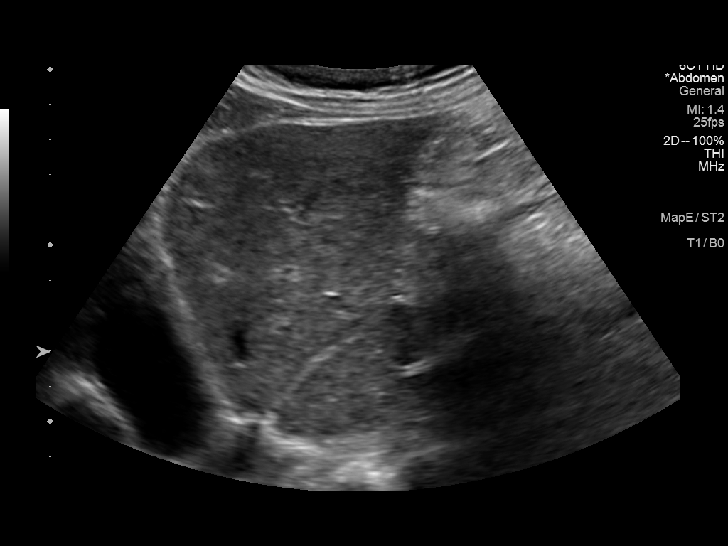
[im 34/101]
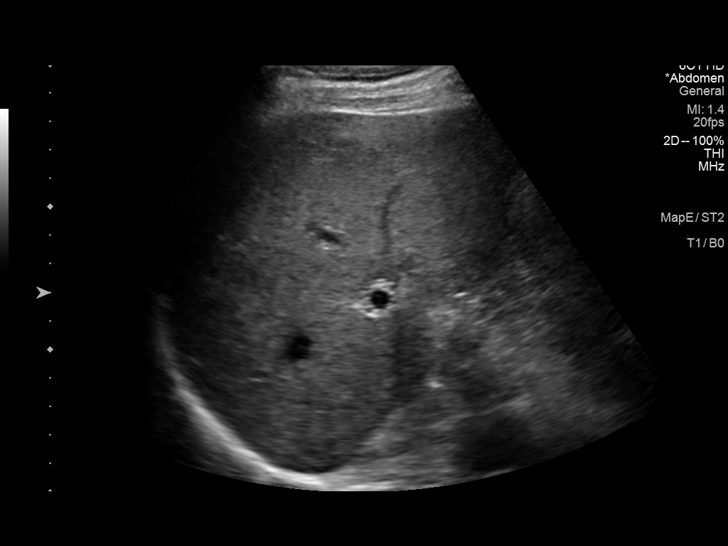
[im 38/101]
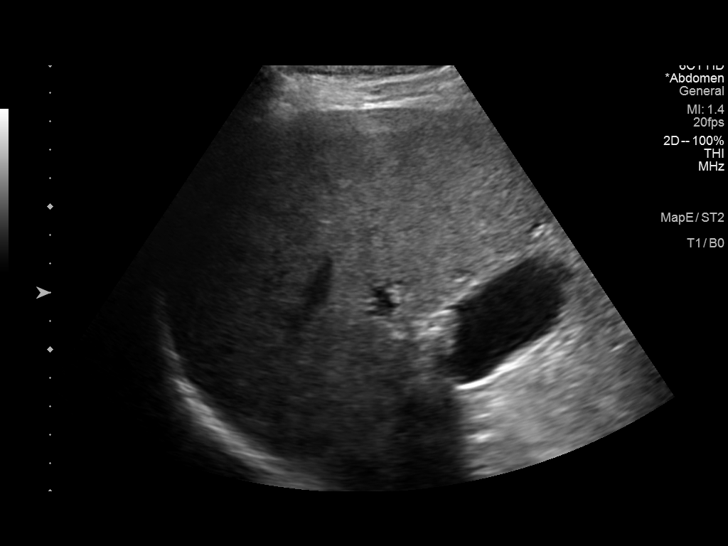
[im 46/101]
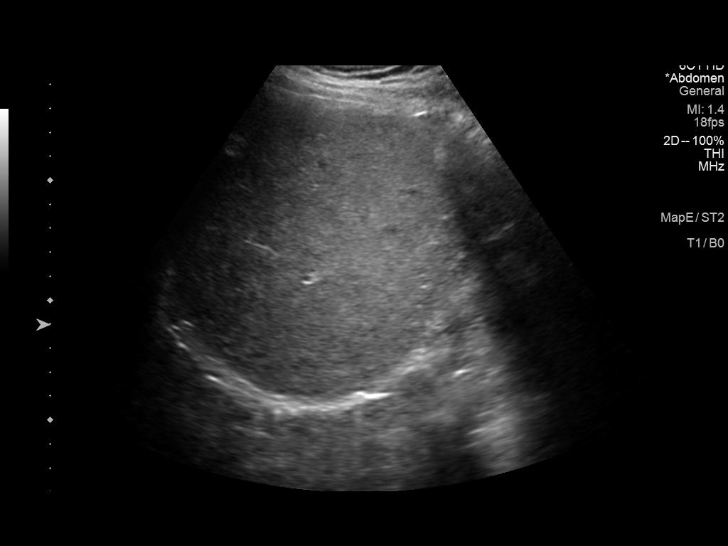
[im 55/101]
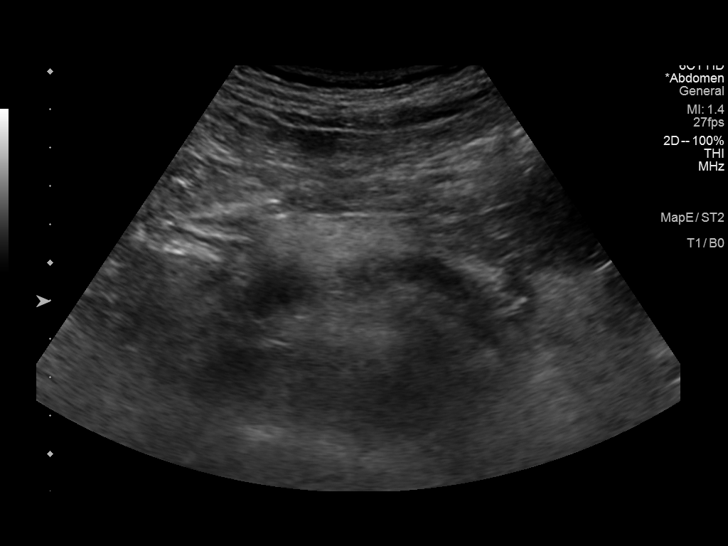
[im 63/101]
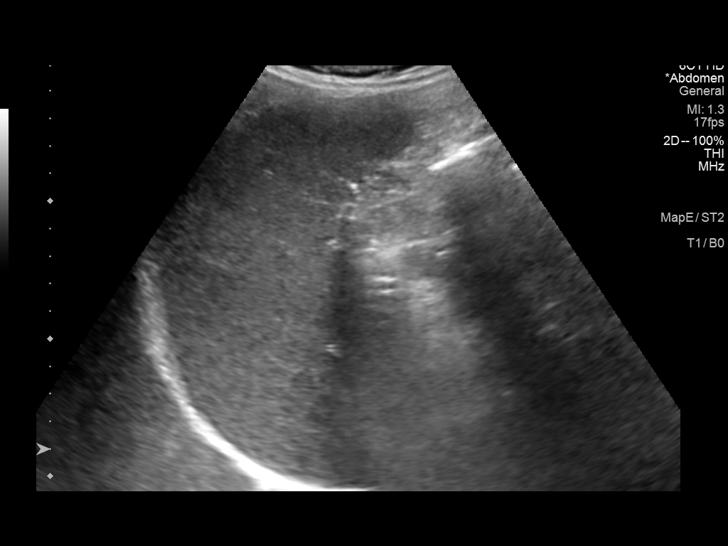
[im 67/101]
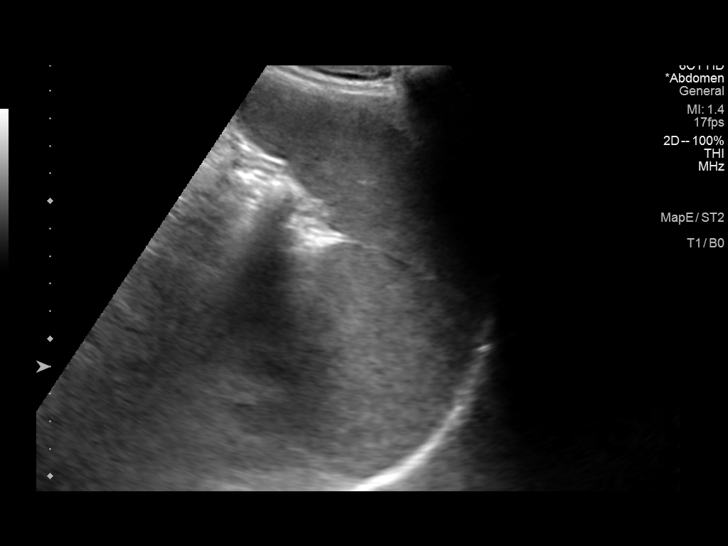
[im 76/101]
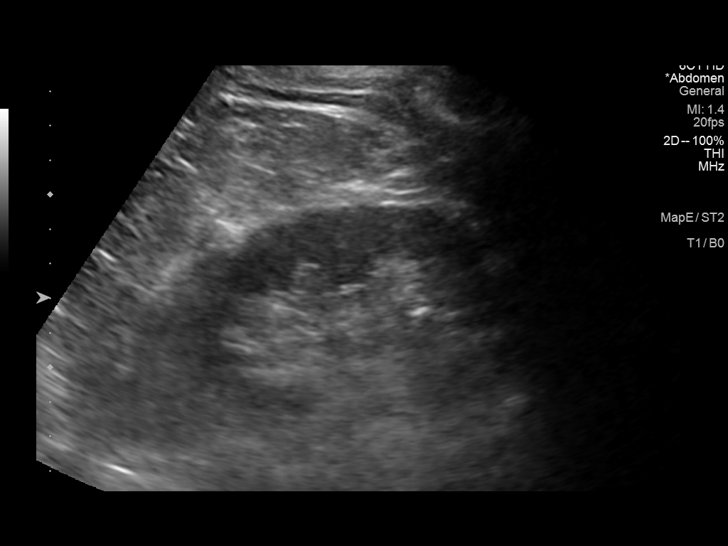
[im 84/101]
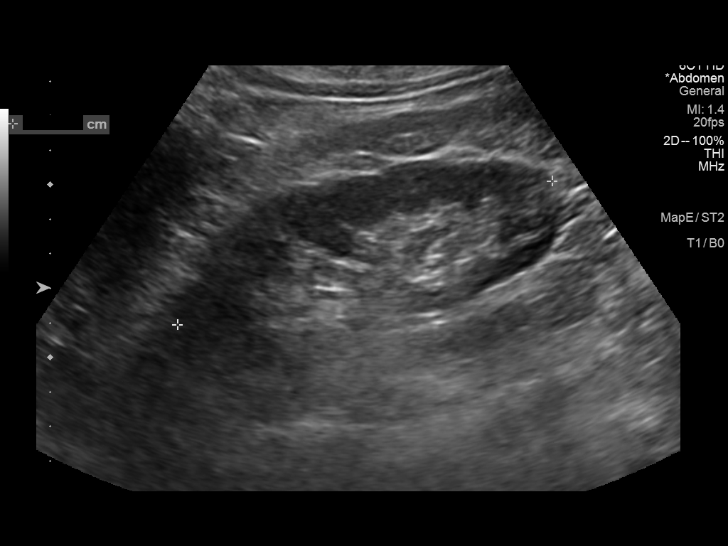
[im 92/101]
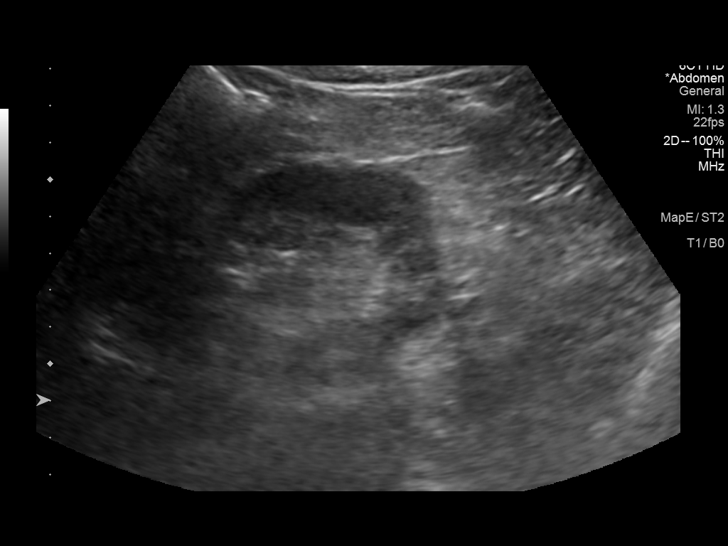
[im 101/101]
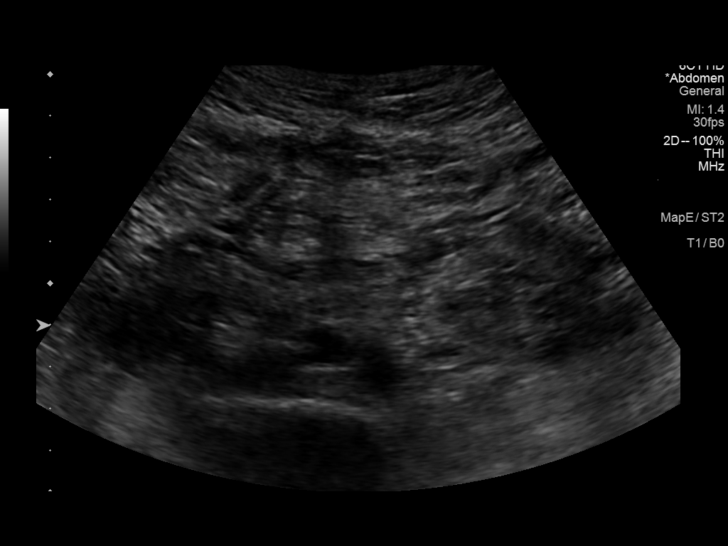

[14 of 25 positions shown; findings below may reference images not displayed]

FINDINGS: Gallbladder: Cholelithiasis. Adenomyomatosis noted in the wall of
the gallbladder. No gallbladder wall thickening or pericholecystic
fluid. Sonographic Murphy sign is negative per technologist.

Common bile duct: Diameter: 5 mm

Liver:

No focal lesion.

Diffusely increased parenchymal echogenicity.

Portal vein is patent on color Doppler imaging with normal direction
of blood flow towards the liver.

IVC: No abnormality visualized.

Pancreas: Visualized portion unremarkable.

Spleen: Spleen is enlarged with a volume of 879 mL.

Right Kidney: Length: 11.4 cm. Echogenicity within normal limits. No
mass or hydronephrosis visualized.

Left Kidney: Length: 11.6 cm. Echogenicity within normal limits. No
mass or hydronephrosis visualized.

Abdominal aorta: No aneurysm visualized.

Other findings: None.
IMPRESSION: 1. Splenomegaly, with splenic volume of 879 mL.
2. Cholelithiasis.
3. Diffuse increased echogenicity of the hepatic parenchyma is a
nonspecific indicator of hepatocellular dysfunction, most commonly
steatosis.

## 2022-10-18 ENCOUNTER — Inpatient Hospital Stay: Payer: Medicare Other

## 2022-10-18 ENCOUNTER — Other Ambulatory Visit: Payer: Self-pay

## 2022-10-18 VITALS — BP 132/71 | HR 76 | Temp 97.9°F | Resp 18

## 2022-10-18 DIAGNOSIS — D469 Myelodysplastic syndrome, unspecified: Secondary | ICD-10-CM

## 2022-10-18 DIAGNOSIS — D461 Refractory anemia with ring sideroblasts: Secondary | ICD-10-CM | POA: Diagnosis not present

## 2022-10-18 LAB — CBC WITH DIFFERENTIAL/PLATELET
Abs Immature Granulocytes: 1.33 10*3/uL — ABNORMAL HIGH (ref 0.00–0.07)
Basophils Absolute: 0.3 10*3/uL — ABNORMAL HIGH (ref 0.0–0.1)
Basophils Relative: 2 %
Eosinophils Absolute: 0.7 10*3/uL — ABNORMAL HIGH (ref 0.0–0.5)
Eosinophils Relative: 4 %
HCT: 32.6 % — ABNORMAL LOW (ref 39.0–52.0)
Hemoglobin: 10 g/dL — ABNORMAL LOW (ref 13.0–17.0)
Immature Granulocytes: 7 %
Lymphocytes Relative: 12 %
Lymphs Abs: 2.2 10*3/uL (ref 0.7–4.0)
MCH: 26 pg (ref 26.0–34.0)
MCHC: 30.7 g/dL (ref 30.0–36.0)
MCV: 84.7 fL (ref 80.0–100.0)
Monocytes Absolute: 0.6 10*3/uL (ref 0.1–1.0)
Monocytes Relative: 3 %
Neutro Abs: 13.7 10*3/uL — ABNORMAL HIGH (ref 1.7–7.7)
Neutrophils Relative %: 72 %
Platelets: 277 10*3/uL (ref 150–400)
RBC: 3.85 MIL/uL — ABNORMAL LOW (ref 4.22–5.81)
RDW: 34.9 % — ABNORMAL HIGH (ref 11.5–15.5)
WBC: 18.8 10*3/uL — ABNORMAL HIGH (ref 4.0–10.5)
nRBC: 0.4 % — ABNORMAL HIGH (ref 0.0–0.2)

## 2022-10-18 LAB — COMPREHENSIVE METABOLIC PANEL
ALT: 13 U/L (ref 0–44)
AST: 11 U/L — ABNORMAL LOW (ref 15–41)
Albumin: 4.2 g/dL (ref 3.5–5.0)
Alkaline Phosphatase: 63 U/L (ref 38–126)
Anion gap: 7 (ref 5–15)
BUN: 23 mg/dL (ref 8–23)
CO2: 26 mmol/L (ref 22–32)
Calcium: 9.5 mg/dL (ref 8.9–10.3)
Chloride: 109 mmol/L (ref 98–111)
Creatinine, Ser: 1.32 mg/dL — ABNORMAL HIGH (ref 0.61–1.24)
GFR, Estimated: 56 mL/min — ABNORMAL LOW (ref >60)
Glucose, Bld: 77 mg/dL (ref 70–99)
Potassium: 4.4 mmol/L (ref 3.5–5.1)
Sodium: 142 mmol/L (ref 135–145)
Total Bilirubin: 0.8 mg/dL (ref 0.3–1.2)
Total Protein: 7.3 g/dL (ref 6.5–8.1)

## 2022-10-18 MED ORDER — EPOETIN ALFA-EPBX 40000 UNIT/ML IJ SOLN
40000.0000 [IU] | Freq: Once | INTRAMUSCULAR | Status: AC
  Administered 2022-10-18: 40000 [IU] via SUBCUTANEOUS
  Filled 2022-10-18: qty 1

## 2022-10-19 ENCOUNTER — Inpatient Hospital Stay: Payer: Medicare Other

## 2022-10-25 ENCOUNTER — Inpatient Hospital Stay: Payer: Medicare Other

## 2022-10-25 ENCOUNTER — Other Ambulatory Visit: Payer: Self-pay

## 2022-10-25 VITALS — BP 140/75 | HR 67 | Temp 98.0°F | Resp 16

## 2022-10-25 DIAGNOSIS — D469 Myelodysplastic syndrome, unspecified: Secondary | ICD-10-CM

## 2022-10-25 DIAGNOSIS — D461 Refractory anemia with ring sideroblasts: Secondary | ICD-10-CM | POA: Diagnosis not present

## 2022-10-25 LAB — CBC WITH DIFFERENTIAL/PLATELET
Abs Immature Granulocytes: 1.28 10*3/uL — ABNORMAL HIGH (ref 0.00–0.07)
Basophils Absolute: 0.3 10*3/uL — ABNORMAL HIGH (ref 0.0–0.1)
Basophils Relative: 1 %
Eosinophils Absolute: 1.1 10*3/uL — ABNORMAL HIGH (ref 0.0–0.5)
Eosinophils Relative: 5 %
HCT: 33.8 % — ABNORMAL LOW (ref 39.0–52.0)
Hemoglobin: 10.5 g/dL — ABNORMAL LOW (ref 13.0–17.0)
Immature Granulocytes: 6 %
Lymphocytes Relative: 12 %
Lymphs Abs: 2.7 10*3/uL (ref 0.7–4.0)
MCH: 25.9 pg — ABNORMAL LOW (ref 26.0–34.0)
MCHC: 31.1 g/dL (ref 30.0–36.0)
MCV: 83.3 fL (ref 80.0–100.0)
Monocytes Absolute: 0.9 10*3/uL (ref 0.1–1.0)
Monocytes Relative: 4 %
Neutro Abs: 15.9 10*3/uL — ABNORMAL HIGH (ref 1.7–7.7)
Neutrophils Relative %: 72 %
Platelets: 287 10*3/uL (ref 150–400)
RBC: 4.06 MIL/uL — ABNORMAL LOW (ref 4.22–5.81)
RDW: 34.5 % — ABNORMAL HIGH (ref 11.5–15.5)
WBC: 22.1 10*3/uL — ABNORMAL HIGH (ref 4.0–10.5)
nRBC: 0.1 % (ref 0.0–0.2)

## 2022-10-25 LAB — COMPREHENSIVE METABOLIC PANEL
ALT: 18 U/L (ref 0–44)
AST: 15 U/L (ref 15–41)
Albumin: 4.5 g/dL (ref 3.5–5.0)
Alkaline Phosphatase: 67 U/L (ref 38–126)
Anion gap: 7 (ref 5–15)
BUN: 24 mg/dL — ABNORMAL HIGH (ref 8–23)
CO2: 25 mmol/L (ref 22–32)
Calcium: 9.4 mg/dL (ref 8.9–10.3)
Chloride: 106 mmol/L (ref 98–111)
Creatinine, Ser: 1.37 mg/dL — ABNORMAL HIGH (ref 0.61–1.24)
GFR, Estimated: 53 mL/min — ABNORMAL LOW (ref >60)
Glucose, Bld: 83 mg/dL (ref 70–99)
Potassium: 4.7 mmol/L (ref 3.5–5.1)
Sodium: 138 mmol/L (ref 135–145)
Total Bilirubin: 0.8 mg/dL (ref 0.3–1.2)
Total Protein: 7.4 g/dL (ref 6.5–8.1)

## 2022-10-25 MED ORDER — EPOETIN ALFA-EPBX 40000 UNIT/ML IJ SOLN
40000.0000 [IU] | Freq: Once | INTRAMUSCULAR | Status: AC
  Administered 2022-10-25: 40000 [IU] via SUBCUTANEOUS
  Filled 2022-10-25: qty 1

## 2022-10-26 ENCOUNTER — Inpatient Hospital Stay: Payer: Medicare Other

## 2022-11-01 ENCOUNTER — Inpatient Hospital Stay: Payer: Medicare Other

## 2022-11-01 VITALS — BP 137/75 | HR 72 | Temp 98.2°F | Resp 16

## 2022-11-01 DIAGNOSIS — D461 Refractory anemia with ring sideroblasts: Secondary | ICD-10-CM | POA: Diagnosis not present

## 2022-11-01 DIAGNOSIS — D469 Myelodysplastic syndrome, unspecified: Secondary | ICD-10-CM

## 2022-11-01 LAB — CBC WITH DIFFERENTIAL/PLATELET
Abs Immature Granulocytes: 0.24 10*3/uL — ABNORMAL HIGH (ref 0.00–0.07)
Basophils Absolute: 0.2 10*3/uL — ABNORMAL HIGH (ref 0.0–0.1)
Basophils Relative: 2 %
Eosinophils Absolute: 1.4 10*3/uL — ABNORMAL HIGH (ref 0.0–0.5)
Eosinophils Relative: 12 %
HCT: 31 % — ABNORMAL LOW (ref 39.0–52.0)
Hemoglobin: 9.8 g/dL — ABNORMAL LOW (ref 13.0–17.0)
Immature Granulocytes: 2 %
Lymphocytes Relative: 17 %
Lymphs Abs: 2 10*3/uL (ref 0.7–4.0)
MCH: 25.3 pg — ABNORMAL LOW (ref 26.0–34.0)
MCHC: 31.6 g/dL (ref 30.0–36.0)
MCV: 80.1 fL (ref 80.0–100.0)
Monocytes Absolute: 0.9 10*3/uL (ref 0.1–1.0)
Monocytes Relative: 7 %
Neutro Abs: 7.3 10*3/uL (ref 1.7–7.7)
Neutrophils Relative %: 60 %
Platelets: 210 10*3/uL (ref 150–400)
RBC: 3.87 MIL/uL — ABNORMAL LOW (ref 4.22–5.81)
RDW: 35.1 % — ABNORMAL HIGH (ref 11.5–15.5)
WBC: 12 10*3/uL — ABNORMAL HIGH (ref 4.0–10.5)
nRBC: 0.2 % (ref 0.0–0.2)

## 2022-11-01 LAB — COMPREHENSIVE METABOLIC PANEL
ALT: 24 U/L (ref 0–44)
AST: 15 U/L (ref 15–41)
Albumin: 4.2 g/dL (ref 3.5–5.0)
Alkaline Phosphatase: 62 U/L (ref 38–126)
Anion gap: 7 (ref 5–15)
BUN: 20 mg/dL (ref 8–23)
CO2: 22 mmol/L (ref 22–32)
Calcium: 8.7 mg/dL — ABNORMAL LOW (ref 8.9–10.3)
Chloride: 110 mmol/L (ref 98–111)
Creatinine, Ser: 1.2 mg/dL (ref 0.61–1.24)
GFR, Estimated: >60 mL/min (ref >60)
Glucose, Bld: 90 mg/dL (ref 70–99)
Potassium: 4.3 mmol/L (ref 3.5–5.1)
Sodium: 139 mmol/L (ref 135–145)
Total Bilirubin: 0.6 mg/dL (ref 0.3–1.2)
Total Protein: 6.6 g/dL (ref 6.5–8.1)

## 2022-11-01 MED ORDER — EPOETIN ALFA-EPBX 40000 UNIT/ML IJ SOLN
40000.0000 [IU] | Freq: Once | INTRAMUSCULAR | Status: AC
  Administered 2022-11-01: 40000 [IU] via SUBCUTANEOUS
  Filled 2022-11-01: qty 1

## 2022-11-02 ENCOUNTER — Inpatient Hospital Stay: Payer: Medicare Other

## 2022-11-08 ENCOUNTER — Other Ambulatory Visit: Payer: Self-pay

## 2022-11-08 ENCOUNTER — Telehealth: Payer: Self-pay

## 2022-11-08 ENCOUNTER — Inpatient Hospital Stay: Payer: Medicare Other

## 2022-11-08 VITALS — BP 122/75 | HR 77 | Temp 97.7°F | Resp 20

## 2022-11-08 DIAGNOSIS — D469 Myelodysplastic syndrome, unspecified: Secondary | ICD-10-CM

## 2022-11-08 DIAGNOSIS — D461 Refractory anemia with ring sideroblasts: Secondary | ICD-10-CM | POA: Diagnosis not present

## 2022-11-08 LAB — CBC WITH DIFFERENTIAL/PLATELET
Abs Immature Granulocytes: 0.18 10*3/uL — ABNORMAL HIGH (ref 0.00–0.07)
Basophils Absolute: 0.4 10*3/uL — ABNORMAL HIGH (ref 0.0–0.1)
Basophils Relative: 3 %
Eosinophils Absolute: 0.6 10*3/uL — ABNORMAL HIGH (ref 0.0–0.5)
Eosinophils Relative: 5 %
HCT: 31 % — ABNORMAL LOW (ref 39.0–52.0)
Hemoglobin: 9.8 g/dL — ABNORMAL LOW (ref 13.0–17.0)
Immature Granulocytes: 2 %
Lymphocytes Relative: 19 %
Lymphs Abs: 2.3 10*3/uL (ref 0.7–4.0)
MCH: 25.3 pg — ABNORMAL LOW (ref 26.0–34.0)
MCHC: 31.6 g/dL (ref 30.0–36.0)
MCV: 80.1 fL (ref 80.0–100.0)
Monocytes Absolute: 0.9 10*3/uL (ref 0.1–1.0)
Monocytes Relative: 8 %
Neutro Abs: 7.5 10*3/uL (ref 1.7–7.7)
Neutrophils Relative %: 63 %
Platelets: 285 10*3/uL (ref 150–400)
RBC: 3.87 MIL/uL — ABNORMAL LOW (ref 4.22–5.81)
RDW: 35.6 % — ABNORMAL HIGH (ref 11.5–15.5)
WBC: 11.8 10*3/uL — ABNORMAL HIGH (ref 4.0–10.5)
nRBC: 0.3 % — ABNORMAL HIGH (ref 0.0–0.2)

## 2022-11-08 LAB — COMPREHENSIVE METABOLIC PANEL
ALT: 15 U/L (ref 0–44)
AST: 9 U/L — ABNORMAL LOW (ref 15–41)
Albumin: 4.3 g/dL (ref 3.5–5.0)
Alkaline Phosphatase: 59 U/L (ref 38–126)
Anion gap: 5 (ref 5–15)
BUN: 23 mg/dL (ref 8–23)
CO2: 26 mmol/L (ref 22–32)
Calcium: 9.3 mg/dL (ref 8.9–10.3)
Chloride: 108 mmol/L (ref 98–111)
Creatinine, Ser: 1.26 mg/dL — ABNORMAL HIGH (ref 0.61–1.24)
GFR, Estimated: 59 mL/min — ABNORMAL LOW (ref >60)
Glucose, Bld: 90 mg/dL (ref 70–99)
Potassium: 4.8 mmol/L (ref 3.5–5.1)
Sodium: 139 mmol/L (ref 135–145)
Total Bilirubin: 0.6 mg/dL (ref 0.3–1.2)
Total Protein: 7 g/dL (ref 6.5–8.1)

## 2022-11-08 MED ORDER — EPOETIN ALFA-EPBX 40000 UNIT/ML IJ SOLN
40000.0000 [IU] | Freq: Once | INTRAMUSCULAR | Status: AC
  Administered 2022-11-08: 40000 [IU] via SUBCUTANEOUS
  Filled 2022-11-08: qty 1

## 2022-11-08 NOTE — Telephone Encounter (Signed)
CRITICAL VALUE STICKER  CRITICAL VALUE: HGB 9.8  RECEIVER (on-site recipient of call): Hernando Reali P. LPN   DATE & TIME NOTIFIED: 7/30 953 am  MESSENGER (representative from lab): Mindi Junker  MD NOTIFIED: Dr. Arbutus Ped

## 2022-11-09 ENCOUNTER — Inpatient Hospital Stay: Payer: Medicare Other

## 2022-11-15 ENCOUNTER — Inpatient Hospital Stay: Payer: Medicare Other

## 2022-11-15 ENCOUNTER — Inpatient Hospital Stay: Payer: Medicare Other | Attending: Internal Medicine

## 2022-11-15 ENCOUNTER — Telehealth: Payer: Self-pay | Admitting: Medical Oncology

## 2022-11-15 ENCOUNTER — Other Ambulatory Visit: Payer: Self-pay

## 2022-11-15 VITALS — BP 148/73 | HR 75 | Resp 18

## 2022-11-15 DIAGNOSIS — Z9089 Acquired absence of other organs: Secondary | ICD-10-CM | POA: Insufficient documentation

## 2022-11-15 DIAGNOSIS — D461 Refractory anemia with ring sideroblasts: Secondary | ICD-10-CM | POA: Insufficient documentation

## 2022-11-15 DIAGNOSIS — K219 Gastro-esophageal reflux disease without esophagitis: Secondary | ICD-10-CM | POA: Diagnosis not present

## 2022-11-15 DIAGNOSIS — Z8546 Personal history of malignant neoplasm of prostate: Secondary | ICD-10-CM | POA: Diagnosis not present

## 2022-11-15 DIAGNOSIS — R109 Unspecified abdominal pain: Secondary | ICD-10-CM | POA: Insufficient documentation

## 2022-11-15 DIAGNOSIS — D72829 Elevated white blood cell count, unspecified: Secondary | ICD-10-CM | POA: Diagnosis present

## 2022-11-15 DIAGNOSIS — R5383 Other fatigue: Secondary | ICD-10-CM | POA: Diagnosis not present

## 2022-11-15 DIAGNOSIS — Z8719 Personal history of other diseases of the digestive system: Secondary | ICD-10-CM | POA: Insufficient documentation

## 2022-11-15 DIAGNOSIS — Z9079 Acquired absence of other genital organ(s): Secondary | ICD-10-CM | POA: Insufficient documentation

## 2022-11-15 DIAGNOSIS — D473 Essential (hemorrhagic) thrombocythemia: Secondary | ICD-10-CM | POA: Insufficient documentation

## 2022-11-15 DIAGNOSIS — Z79899 Other long term (current) drug therapy: Secondary | ICD-10-CM | POA: Diagnosis not present

## 2022-11-15 DIAGNOSIS — R531 Weakness: Secondary | ICD-10-CM | POA: Diagnosis not present

## 2022-11-15 DIAGNOSIS — Z7961 Long term (current) use of immunomodulator: Secondary | ICD-10-CM | POA: Diagnosis not present

## 2022-11-15 DIAGNOSIS — Z86018 Personal history of other benign neoplasm: Secondary | ICD-10-CM | POA: Insufficient documentation

## 2022-11-15 DIAGNOSIS — D469 Myelodysplastic syndrome, unspecified: Secondary | ICD-10-CM

## 2022-11-15 LAB — CBC WITH DIFFERENTIAL/PLATELET
Abs Immature Granulocytes: 0.96 10*3/uL — ABNORMAL HIGH (ref 0.00–0.07)
Basophils Absolute: 0.3 10*3/uL — ABNORMAL HIGH (ref 0.0–0.1)
Basophils Relative: 2 %
Eosinophils Absolute: 0.6 10*3/uL — ABNORMAL HIGH (ref 0.0–0.5)
Eosinophils Relative: 4 %
HCT: 31.4 % — ABNORMAL LOW (ref 39.0–52.0)
Hemoglobin: 9.8 g/dL — ABNORMAL LOW (ref 13.0–17.0)
Immature Granulocytes: 6 %
Lymphocytes Relative: 13 %
Lymphs Abs: 2.2 10*3/uL (ref 0.7–4.0)
MCH: 25.9 pg — ABNORMAL LOW (ref 26.0–34.0)
MCHC: 31.2 g/dL (ref 30.0–36.0)
MCV: 82.8 fL (ref 80.0–100.0)
Monocytes Absolute: 0.7 10*3/uL (ref 0.1–1.0)
Monocytes Relative: 4 %
Neutro Abs: 11.7 10*3/uL — ABNORMAL HIGH (ref 1.7–7.7)
Neutrophils Relative %: 71 %
Platelets: 255 10*3/uL (ref 150–400)
RBC: 3.79 MIL/uL — ABNORMAL LOW (ref 4.22–5.81)
RDW: 34.5 % — ABNORMAL HIGH (ref 11.5–15.5)
WBC Morphology: INCREASED
WBC: 16.5 10*3/uL — ABNORMAL HIGH (ref 4.0–10.5)
nRBC: 0.5 % — ABNORMAL HIGH (ref 0.0–0.2)

## 2022-11-15 LAB — COMPREHENSIVE METABOLIC PANEL
ALT: 12 U/L (ref 0–44)
AST: 12 U/L — ABNORMAL LOW (ref 15–41)
Albumin: 4.4 g/dL (ref 3.5–5.0)
Alkaline Phosphatase: 61 U/L (ref 38–126)
Anion gap: 7 (ref 5–15)
BUN: 19 mg/dL (ref 8–23)
CO2: 26 mmol/L (ref 22–32)
Calcium: 9 mg/dL (ref 8.9–10.3)
Chloride: 107 mmol/L (ref 98–111)
Creatinine, Ser: 1.31 mg/dL — ABNORMAL HIGH (ref 0.61–1.24)
GFR, Estimated: 56 mL/min — ABNORMAL LOW (ref >60)
Glucose, Bld: 88 mg/dL (ref 70–99)
Potassium: 4.4 mmol/L (ref 3.5–5.1)
Sodium: 140 mmol/L (ref 135–145)
Total Bilirubin: 0.7 mg/dL (ref 0.3–1.2)
Total Protein: 7.2 g/dL (ref 6.5–8.1)

## 2022-11-15 MED ORDER — EPOETIN ALFA-EPBX 40000 UNIT/ML IJ SOLN
40000.0000 [IU] | Freq: Once | INTRAMUSCULAR | Status: AC
  Administered 2022-11-15: 40000 [IU] via SUBCUTANEOUS
  Filled 2022-11-15: qty 1

## 2022-11-15 NOTE — Telephone Encounter (Signed)
Per Leta Jungling -pts HGB = 9.8

## 2022-11-16 ENCOUNTER — Inpatient Hospital Stay: Payer: Medicare Other

## 2022-11-22 ENCOUNTER — Other Ambulatory Visit: Payer: Self-pay

## 2022-11-22 ENCOUNTER — Inpatient Hospital Stay: Payer: Medicare Other

## 2022-11-22 ENCOUNTER — Telehealth: Payer: Self-pay | Admitting: Medical Oncology

## 2022-11-22 ENCOUNTER — Inpatient Hospital Stay (HOSPITAL_BASED_OUTPATIENT_CLINIC_OR_DEPARTMENT_OTHER): Payer: Medicare Other | Admitting: Internal Medicine

## 2022-11-22 VITALS — BP 144/74 | HR 77 | Temp 97.4°F | Resp 16 | Ht 69.0 in | Wt 158.0 lb

## 2022-11-22 DIAGNOSIS — D469 Myelodysplastic syndrome, unspecified: Secondary | ICD-10-CM

## 2022-11-22 DIAGNOSIS — D461 Refractory anemia with ring sideroblasts: Secondary | ICD-10-CM | POA: Diagnosis not present

## 2022-11-22 LAB — CBC WITH DIFFERENTIAL (CANCER CENTER ONLY)
Abs Immature Granulocytes: 1.59 10*3/uL — ABNORMAL HIGH (ref 0.00–0.07)
Basophils Absolute: 0.3 10*3/uL — ABNORMAL HIGH (ref 0.0–0.1)
Basophils Relative: 1 %
Eosinophils Absolute: 0.8 10*3/uL — ABNORMAL HIGH (ref 0.0–0.5)
Eosinophils Relative: 4 %
HCT: 33 % — ABNORMAL LOW (ref 39.0–52.0)
Hemoglobin: 10.3 g/dL — ABNORMAL LOW (ref 13.0–17.0)
Immature Granulocytes: 8 %
Lymphocytes Relative: 10 %
Lymphs Abs: 2.1 10*3/uL (ref 0.7–4.0)
MCH: 26.1 pg (ref 26.0–34.0)
MCHC: 31.2 g/dL (ref 30.0–36.0)
MCV: 83.8 fL (ref 80.0–100.0)
Monocytes Absolute: 0.8 10*3/uL (ref 0.1–1.0)
Monocytes Relative: 4 %
Neutro Abs: 15.7 10*3/uL — ABNORMAL HIGH (ref 1.7–7.7)
Neutrophils Relative %: 73 %
Platelet Count: 238 10*3/uL (ref 150–400)
RBC: 3.94 MIL/uL — ABNORMAL LOW (ref 4.22–5.81)
RDW: 34.2 % — ABNORMAL HIGH (ref 11.5–15.5)
WBC Count: 21.3 10*3/uL — ABNORMAL HIGH (ref 4.0–10.5)
nRBC: 0.3 % — ABNORMAL HIGH (ref 0.0–0.2)

## 2022-11-22 LAB — CMP (CANCER CENTER ONLY)
ALT: 11 U/L (ref 0–44)
AST: 11 U/L — ABNORMAL LOW (ref 15–41)
Albumin: 4.5 g/dL (ref 3.5–5.0)
Alkaline Phosphatase: 63 U/L (ref 38–126)
Anion gap: 5 (ref 5–15)
BUN: 26 mg/dL — ABNORMAL HIGH (ref 8–23)
CO2: 27 mmol/L (ref 22–32)
Calcium: 9.3 mg/dL (ref 8.9–10.3)
Chloride: 108 mmol/L (ref 98–111)
Creatinine: 1.26 mg/dL — ABNORMAL HIGH (ref 0.61–1.24)
GFR, Estimated: 59 mL/min — ABNORMAL LOW (ref >60)
Glucose, Bld: 99 mg/dL (ref 70–99)
Potassium: 4.3 mmol/L (ref 3.5–5.1)
Sodium: 140 mmol/L (ref 135–145)
Total Bilirubin: 0.6 mg/dL (ref 0.3–1.2)
Total Protein: 7.3 g/dL (ref 6.5–8.1)

## 2022-11-22 LAB — IRON AND IRON BINDING CAPACITY (CC-WL,HP ONLY)
Iron: 95 ug/dL (ref 45–182)
Saturation Ratios: 37 % (ref 17.9–39.5)
TIBC: 260 ug/dL (ref 250–450)
UIBC: 165 ug/dL (ref 117–376)

## 2022-11-22 LAB — LACTATE DEHYDROGENASE: LDH: 376 U/L — ABNORMAL HIGH (ref 98–192)

## 2022-11-22 LAB — FERRITIN: Ferritin: 376 ng/mL — ABNORMAL HIGH (ref 24–336)

## 2022-11-22 MED ORDER — EPOETIN ALFA-EPBX 40000 UNIT/ML IJ SOLN
40000.0000 [IU] | Freq: Once | INTRAMUSCULAR | Status: AC
  Administered 2022-11-22: 40000 [IU] via SUBCUTANEOUS
  Filled 2022-11-22: qty 1

## 2022-11-22 NOTE — Progress Notes (Signed)
[facility]  [phone] Fax:([phone]  OFFICE PROGRESS NOTE  Garlan Fillers, MD [address] 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   PRIOR THERAPY: Previous treatment with combination of Hydrea and anagrelide.  CURRENT THERAPY:  1) Revlimid 5 mg by mouth daily 2 weeks on and 2 weeks off.  Started 03/25/2016.  Managed by Dr. Anise Salvo at [facility] 2) anagrelide 0.5 mg p.o. twice daily. 3) Aranesp 300 mcg subcutaneously every 3 weeks.  This is switched to Procrit 400 mcg subcutaneously every 2 weeks starting April 13, 2017.  The frequency of the Procrit was changed to weekly schedule when he was in [place].  He is currently on Retacrit weekly as a biosimilar to Procrit.  INTERVAL HISTORY: Roy Johnson 77 y.o. male returns to the clinic today for follow-up visit.  The patient is feeling fine today with no concerning complaints except for the baseline fatigue and weakness.  He also has occasional abdominal bloating.  He denied having any chest pain, shortness of breath, cough or hemoptysis.  He has no nausea, vomiting, diarrhea or constipation.  He has no headache or visual changes.  He denied having any significant weight loss or night sweats.  He continues to tolerate his Retacrit injection fairly well.  He is here today for evaluation and repeat blood work.  MEDICAL HISTORY: Past Medical History:  Diagnosis Date   Anemia    Anesthesia of skin    Calculus of gallbladder with acute cholecystitis, with obstruction    Cellulitis of back    Chest pain    Chronic kidney disease (CKD), stage III (moderate) (HCC)    Colon polyp    Cough    Disorientation    Diverticulitis    Diverticulosis    Dyspnea on exertion    Elevated white blood cell count    Fatigue     Forgetfulness    GERD (gastroesophageal reflux disease)    Hearing loss    HTN (hypertension)    Hydrocele    Hyperkalemia    Hyperlipidemia    Hypertensive chronic kidney disease with stage 1 through stage 4 chronic kidney disease, or unspecified chronic kidney disease    Hypokalemia    IBS (irritable bowel syndrome)    Impacted cerumen, bilateral    Inguinal hernia    Insomnia    Lightheadedness    Liver disease    Lower abdominal pain    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  MEDICATIONS:  Current Outpatient Medications  Medication Sig Dispense Refill   amLODipine (NORVASC) 5 MG tablet Take 5 mg by mouth daily.  2   anagrelide (  AGRYLIN) 1 MG capsule TAKE 1 CAPSULE(1 MG) BY MOUTH DAILY 90 capsule 0   cetirizine (ZYRTEC) 10 MG tablet TAKE 1 TABLET(10 MG) BY MOUTH DAILY     cholestyramine (QUESTRAN) 4 g packet MIX AND DRINK 1 PACKET(4 GRAMS) BY MOUTH DAILY 30 each 2   colestipol (COLESTID) 1 g tablet TAKE 2 TABLETS(2 GRAMS) BY MOUTH TWICE DAILY 360 tablet 3   diphenoxylate-atropine (LOMOTIL) 2.5-0.025 MG tablet Take 1 tablet by mouth 4 (four) times daily as needed.     epoetin alfa-epbx (RETACRIT) 40981 UNIT/ML injection Inject into the skin.     gabapentin (NEURONTIN) 100 MG capsule TAKE 1 CAPSULE BY MOUTH TWICE DAILY AND 2 AT NIGHT 360 capsule 1   hydrocortisone (ANUSOL-HC) 2.5 % rectal cream Place 1 Application rectally 3  (three) times daily as needed for hemorrhoids or anal itching. 30 g 0   hyoscyamine (LEVSIN SL) 0.125 MG SL tablet DISSOLVE 1 TABLET(0.125 MG) UNDER THE TONGUE EVERY 4 HOURS AS NEEDED 90 tablet 3   lenalidomide (REVLIMID) 5 MG capsule Take 5 mg by mouth daily.     Multiple Vitamin (MULTI-VITAMINS) TABS Take 1 tablet by mouth daily.     ondansetron (ZOFRAN) 8 MG tablet TAKE 1 TABLET(8 MG) BY MOUTH EVERY 8 HOURS AS NEEDED FOR NAUSEA OR VOMITING 20 tablet 0   pantoprazole (PROTONIX) 40 MG tablet Take 1 tablet (40 mg total) by mouth daily. 90 tablet 3   Probiotic Product (ALIGN) 4 MG CAPS Take 1 capsule by mouth daily.     vitamin B-12 (CYANOCOBALAMIN) 1000 MCG tablet Take 1 tablet by mouth daily as needed.     No current facility-administered medications for this visit.   Facility-Administered Medications Ordered in Other Visits  Medication Dose Route Frequency Provider Last Rate Last Admin   0.9 %  sodium chloride infusion  250 mL Intravenous Once Si Gaul, MD        SURGICAL HISTORY:  Past Surgical History:  Procedure Laterality Date   COLONOSCOPY     POLYPECTOMY     PROSTATECTOMY     TONSILLECTOMY      REVIEW OF SYSTEMS:  A comprehensive review of systems was negative except for: Constitutional: positive for fatigue Gastrointestinal: positive for abdominal pain   PHYSICAL EXAMINATION: General appearance: alert, cooperative, appears stated age, fatigued, and no distress Head: Normocephalic, without obvious abnormality, atraumatic Neck: no adenopathy, no JVD, supple, symmetrical, trachea midline, and thyroid not enlarged, symmetric, no tenderness/mass/nodules Lymph nodes: Cervical, supraclavicular, and axillary nodes normal. Resp: clear to auscultation bilaterally Back: symmetric, no curvature. ROM normal. No CVA tenderness. Cardio: regular rate and rhythm, S1, S2 normal, no murmur, click, rub or gallop GI: soft, non-tender; bowel sounds normal; no masses,  no  organomegaly Extremities: extremities normal, atraumatic, no cyanosis or edema  ECOG PERFORMANCE STATUS: 1 - Symptomatic but completely ambulatory  Blood pressure (!) 144/74, pulse 77, temperature (!) 97.4 F (36.3 C), temperature source Oral, resp. rate 16, height 5\' 9"  (1.753 m), weight 158 lb (71.7 kg), SpO2 99%.  LABORATORY DATA:  Lab Results  Component Value Date   WBC 16.5 (H) 11/15/2022   HGB 9.8 (L) 11/15/2022   HCT 31.4 (L) 11/15/2022   MCV 82.8 11/15/2022   PLT 255 11/15/2022      Chemistry      Component Value Date/Time   NA 140 11/15/2022 0910   NA 141 12/08/2016 0928   K 4.4 11/15/2022 0910   K 4.5 12/08/2016 0928   CL 107 11/15/2022  0910   CO2 26 11/15/2022 0910   CO2 27 12/08/2016 0928   BUN 19 11/15/2022 0910   BUN 14.1 12/08/2016 0928   CREATININE 1.31 (H) 11/15/2022 0910   CREATININE 1.34 (H) 08/23/2022 0838   CREATININE 0.9 12/08/2016 0928      Component Value Date/Time   CALCIUM 9.0 11/15/2022 0910   CALCIUM 9.0 12/08/2016 0928   ALKPHOS 61 11/15/2022 0910   ALKPHOS 87 12/08/2016 0928   AST 12 (L) 11/15/2022 0910   AST 11 (L) 08/23/2022 0838   AST 25 12/08/2016 0928   ALT 12 11/15/2022 0910   ALT 14 08/23/2022 0838   ALT 41 12/08/2016 0928   BILITOT 0.7 11/15/2022 0910   BILITOT 0.9 08/23/2022 0838   BILITOT 0.84 12/08/2016 0928      RADIOGRAPHIC STUDIES:  ASSESSMENT AND PLAN:  This is a very pleasant 77 years old white male with essential thrombocythemia with positive JAK-2 mutation as well as myelodysplastic syndrome. He is currently on treatment with Revlimid 5 mg by mouth daily according to the recommendation from [facility].  He is also on treatment with Aranesp every 3 weeks.  Aranesp was a change it to Procrit initially every 2 weeks and currently on weekly basis.  He mentioned that he is feeling much better on Procrit weekly  The patient has been on treatment with Retacrit 40,000 unit weekly for more than 3 years.  He has been  tolerating this treatment well with no concerning adverse effects. I recommended for the patient to continue with the weekly Procrit as planned.  He is also on treatment with anagrelide and Revlimid. I will see him back for follow-up visit in around 4 months before he travels back to [place] where he stayed there during the winter months. He also has an appointment with Dr. Anise Salvo at [facility] for evaluation and follow-up visit. He was advised to call immediately if he has any other concerning symptoms in the interval.  All questions were answered. The patient knows to call the clinic with any problems, questions or concerns. We can certainly see the patient much sooner if necessary.   Disclaimer: This note was dictated with voice recognition software. Similar sounding words can inadvertently be transcribed and may not be corrected upon review.

## 2022-11-22 NOTE — Telephone Encounter (Signed)
Lab reported HGB is 10.2 today

## 2022-11-23 ENCOUNTER — Inpatient Hospital Stay: Payer: Medicare Other

## 2022-11-23 ENCOUNTER — Inpatient Hospital Stay: Payer: Medicare Other | Admitting: Internal Medicine

## 2022-11-29 ENCOUNTER — Telehealth: Payer: Self-pay | Admitting: Internal Medicine

## 2022-11-29 ENCOUNTER — Ambulatory Visit: Payer: PRIVATE HEALTH INSURANCE

## 2022-11-30 ENCOUNTER — Inpatient Hospital Stay: Payer: Medicare Other

## 2022-11-30 VITALS — BP 134/65 | HR 68 | Temp 97.8°F | Resp 18

## 2022-11-30 DIAGNOSIS — D469 Myelodysplastic syndrome, unspecified: Secondary | ICD-10-CM

## 2022-11-30 DIAGNOSIS — D461 Refractory anemia with ring sideroblasts: Secondary | ICD-10-CM | POA: Diagnosis not present

## 2022-11-30 LAB — CBC WITH DIFFERENTIAL/PLATELET
Abs Immature Granulocytes: 1.43 10*3/uL — ABNORMAL HIGH (ref 0.00–0.07)
Basophils Absolute: 0.2 10*3/uL — ABNORMAL HIGH (ref 0.0–0.1)
Basophils Relative: 1 %
Eosinophils Absolute: 1.6 10*3/uL — ABNORMAL HIGH (ref 0.0–0.5)
Eosinophils Relative: 7 %
HCT: 32.4 % — ABNORMAL LOW (ref 39.0–52.0)
Hemoglobin: 10 g/dL — ABNORMAL LOW (ref 13.0–17.0)
Immature Granulocytes: 7 %
Lymphocytes Relative: 11 %
Lymphs Abs: 2.4 10*3/uL (ref 0.7–4.0)
MCH: 25.3 pg — ABNORMAL LOW (ref 26.0–34.0)
MCHC: 30.9 g/dL (ref 30.0–36.0)
MCV: 82 fL (ref 80.0–100.0)
Monocytes Absolute: 0.8 10*3/uL (ref 0.1–1.0)
Monocytes Relative: 3 %
Neutro Abs: 15.7 10*3/uL — ABNORMAL HIGH (ref 1.7–7.7)
Neutrophils Relative %: 71 %
Platelets: 251 10*3/uL (ref 150–400)
RBC: 3.95 MIL/uL — ABNORMAL LOW (ref 4.22–5.81)
RDW: 35.5 % — ABNORMAL HIGH (ref 11.5–15.5)
WBC: 22.1 10*3/uL — ABNORMAL HIGH (ref 4.0–10.5)
nRBC: 0.1 % (ref 0.0–0.2)

## 2022-11-30 LAB — COMPREHENSIVE METABOLIC PANEL
ALT: 14 U/L (ref 0–44)
AST: 12 U/L — ABNORMAL LOW (ref 15–41)
Albumin: 4.3 g/dL (ref 3.5–5.0)
Alkaline Phosphatase: 67 U/L (ref 38–126)
Anion gap: 5 (ref 5–15)
BUN: 28 mg/dL — ABNORMAL HIGH (ref 8–23)
CO2: 26 mmol/L (ref 22–32)
Calcium: 9.2 mg/dL (ref 8.9–10.3)
Chloride: 110 mmol/L (ref 98–111)
Creatinine, Ser: 1.22 mg/dL (ref 0.61–1.24)
GFR, Estimated: >60 mL/min (ref >60)
Glucose, Bld: 95 mg/dL (ref 70–99)
Potassium: 4.4 mmol/L (ref 3.5–5.1)
Sodium: 141 mmol/L (ref 135–145)
Total Bilirubin: 0.5 mg/dL (ref 0.3–1.2)
Total Protein: 7.3 g/dL (ref 6.5–8.1)

## 2022-11-30 MED ORDER — EPOETIN ALFA-EPBX 40000 UNIT/ML IJ SOLN
40000.0000 [IU] | Freq: Once | INTRAMUSCULAR | Status: AC
  Administered 2022-11-30: 40000 [IU] via SUBCUTANEOUS
  Filled 2022-11-30: qty 1

## 2022-12-05 ENCOUNTER — Telehealth: Payer: Self-pay | Admitting: Medical Oncology

## 2022-12-05 ENCOUNTER — Other Ambulatory Visit: Payer: Self-pay | Admitting: Internal Medicine

## 2022-12-05 NOTE — Telephone Encounter (Signed)
Today -saw Dr Anise Salvo @ [facility] .His HGB =9.5 She wants [facility] to start Luspatercept instead of Epogen. Mohamed notified.  Called pt and "Mail box is full".

## 2022-12-06 ENCOUNTER — Encounter: Payer: Self-pay | Admitting: Internal Medicine

## 2022-12-06 ENCOUNTER — Ambulatory Visit: Payer: PRIVATE HEALTH INSURANCE

## 2022-12-06 ENCOUNTER — Other Ambulatory Visit: Payer: Self-pay | Admitting: Internal Medicine

## 2022-12-06 DIAGNOSIS — D469 Myelodysplastic syndrome, unspecified: Secondary | ICD-10-CM

## 2022-12-06 MED ORDER — LUSPATERCEPT-AAMT 75 MG ~~LOC~~ SOLR: 1.0000 mg/kg | SUBCUTANEOUS | Status: DC

## 2022-12-06 NOTE — Addendum Note (Signed)
Addended by: Amaryllis Dyke on: 12/06/2022 05:23 PM   Modules accepted: Orders

## 2022-12-07 ENCOUNTER — Telehealth: Payer: Self-pay | Admitting: Medical Oncology

## 2022-12-07 ENCOUNTER — Other Ambulatory Visit: Payer: Self-pay | Admitting: Physician Assistant

## 2022-12-07 ENCOUNTER — Telehealth: Payer: Self-pay

## 2022-12-07 ENCOUNTER — Other Ambulatory Visit: Payer: Self-pay

## 2022-12-07 ENCOUNTER — Encounter: Payer: Self-pay | Admitting: Physician Assistant

## 2022-12-07 ENCOUNTER — Encounter: Payer: Self-pay | Admitting: Internal Medicine

## 2022-12-07 ENCOUNTER — Other Ambulatory Visit: Payer: Self-pay | Admitting: Internal Medicine

## 2022-12-07 DIAGNOSIS — D469 Myelodysplastic syndrome, unspecified: Secondary | ICD-10-CM

## 2022-12-07 NOTE — Telephone Encounter (Signed)
This nurse reached out to this patient and made patient aware that insurance has not approved for him to receive Luspatercept.  Patient states he will just like to receive the Retacrit on tomorrow 12/08/22 instead.  Patient has no further questions or concerns at this time.

## 2022-12-08 ENCOUNTER — Telehealth: Payer: Self-pay | Admitting: Medical Oncology

## 2022-12-08 ENCOUNTER — Encounter: Payer: Self-pay | Admitting: Internal Medicine

## 2022-12-08 ENCOUNTER — Inpatient Hospital Stay: Payer: Medicare Other

## 2022-12-08 ENCOUNTER — Other Ambulatory Visit: Payer: Self-pay | Admitting: Medical Oncology

## 2022-12-08 ENCOUNTER — Encounter: Payer: Self-pay | Admitting: Medical Oncology

## 2022-12-08 ENCOUNTER — Encounter: Payer: Self-pay | Admitting: Physician Assistant

## 2022-12-08 VITALS — BP 140/70 | HR 66 | Temp 97.6°F | Resp 18

## 2022-12-08 DIAGNOSIS — D461 Refractory anemia with ring sideroblasts: Secondary | ICD-10-CM | POA: Diagnosis not present

## 2022-12-08 DIAGNOSIS — D469 Myelodysplastic syndrome, unspecified: Secondary | ICD-10-CM

## 2022-12-08 DIAGNOSIS — R7401 Elevation of levels of liver transaminase levels: Secondary | ICD-10-CM

## 2022-12-08 LAB — COMPREHENSIVE METABOLIC PANEL
ALT: 202 U/L — ABNORMAL HIGH (ref 0–44)
AST: 53 U/L — ABNORMAL HIGH (ref 15–41)
Albumin: 4.4 g/dL (ref 3.5–5.0)
Alkaline Phosphatase: 104 U/L (ref 38–126)
Anion gap: 6 (ref 5–15)
BUN: 28 mg/dL — ABNORMAL HIGH (ref 8–23)
CO2: 25 mmol/L (ref 22–32)
Calcium: 9.2 mg/dL (ref 8.9–10.3)
Chloride: 109 mmol/L (ref 98–111)
Creatinine, Ser: 1.25 mg/dL — ABNORMAL HIGH (ref 0.61–1.24)
GFR, Estimated: 59 mL/min — ABNORMAL LOW (ref >60)
Glucose, Bld: 100 mg/dL — ABNORMAL HIGH (ref 70–99)
Potassium: 5.3 mmol/L — ABNORMAL HIGH (ref 3.5–5.1)
Sodium: 140 mmol/L (ref 135–145)
Total Bilirubin: 1 mg/dL (ref 0.3–1.2)
Total Protein: 7.2 g/dL (ref 6.5–8.1)

## 2022-12-08 LAB — CBC WITH DIFFERENTIAL (CANCER CENTER ONLY)
Abs Immature Granulocytes: 0.26 10*3/uL — ABNORMAL HIGH (ref 0.00–0.07)
Basophils Absolute: 0.2 10*3/uL — ABNORMAL HIGH (ref 0.0–0.1)
Basophils Relative: 2 %
Eosinophils Absolute: 0.8 10*3/uL — ABNORMAL HIGH (ref 0.0–0.5)
Eosinophils Relative: 7 %
HCT: 29.8 % — ABNORMAL LOW (ref 39.0–52.0)
Hemoglobin: 9.4 g/dL — ABNORMAL LOW (ref 13.0–17.0)
Immature Granulocytes: 2 %
Lymphocytes Relative: 14 %
Lymphs Abs: 1.6 10*3/uL (ref 0.7–4.0)
MCH: 25.5 pg — ABNORMAL LOW (ref 26.0–34.0)
MCHC: 31.5 g/dL (ref 30.0–36.0)
MCV: 80.8 fL (ref 80.0–100.0)
Monocytes Absolute: 0.9 10*3/uL (ref 0.1–1.0)
Monocytes Relative: 8 %
Neutro Abs: 7.8 10*3/uL — ABNORMAL HIGH (ref 1.7–7.7)
Neutrophils Relative %: 67 %
Platelet Count: 288 10*3/uL (ref 150–400)
RBC: 3.69 MIL/uL — ABNORMAL LOW (ref 4.22–5.81)
RDW: 34.2 % — ABNORMAL HIGH (ref 11.5–15.5)
WBC Count: 11.5 10*3/uL — ABNORMAL HIGH (ref 4.0–10.5)
nRBC: 0.2 % (ref 0.0–0.2)

## 2022-12-08 MED ORDER — EPOETIN ALFA-EPBX 40000 UNIT/ML IJ SOLN
40000.0000 [IU] | Freq: Once | INTRAMUSCULAR | Status: AC
  Administered 2022-12-08: 40000 [IU] via SUBCUTANEOUS
  Filled 2022-12-08: qty 1

## 2022-12-08 MED ORDER — LUSPATERCEPT-AAMT 75 MG ~~LOC~~ SOLR
1.0000 mg/kg | Freq: Once | SUBCUTANEOUS | Status: DC
  Filled 2022-12-08: qty 1.4

## 2022-12-08 NOTE — Progress Notes (Signed)
Pt was instructed to avoid tylenol and alcohol.

## 2022-12-08 NOTE — Progress Notes (Unsigned)
HGb 9.4 today. Pt meets parameters for retacrit today.

## 2022-12-08 NOTE — Telephone Encounter (Addendum)
Pt denies any abdominal pain or GI symptoms. Per Dr. Arbutus Ped repeat CMP tomorrow . Appt given and orders entered . Pt notified and instructed to avoid Tylenol and Alcohol.   ALT elevated @ 202. It was normal on monday @ [facility].

## 2022-12-08 NOTE — Progress Notes (Signed)
CMP ordered for tomorrow.

## 2022-12-08 NOTE — Telephone Encounter (Signed)
Roy Johnson said she  can look at denial letter . I gave email to [employer].

## 2022-12-09 ENCOUNTER — Other Ambulatory Visit: Payer: Self-pay

## 2022-12-09 ENCOUNTER — Other Ambulatory Visit: Payer: Self-pay | Admitting: Physician Assistant

## 2022-12-09 ENCOUNTER — Encounter: Payer: Self-pay | Admitting: Internal Medicine

## 2022-12-09 ENCOUNTER — Inpatient Hospital Stay: Payer: Medicare Other

## 2022-12-09 ENCOUNTER — Ambulatory Visit (HOSPITAL_COMMUNITY)
Admission: RE | Admit: 2022-12-09 | Discharge: 2022-12-09 | Disposition: A | Discharge status: Home / Self Care | Payer: Medicare Other | Source: Ambulatory Visit | Attending: Physician Assistant | Admitting: Physician Assistant

## 2022-12-09 DIAGNOSIS — R7401 Elevation of levels of liver transaminase levels: Secondary | ICD-10-CM | POA: Diagnosis present

## 2022-12-09 LAB — CMP (CANCER CENTER ONLY)
ALT: 130 U/L — ABNORMAL HIGH (ref 0–44)
AST: 21 U/L (ref 15–41)
Albumin: 4.4 g/dL (ref 3.5–5.0)
Alkaline Phosphatase: 88 U/L (ref 38–126)
Anion gap: 4 — ABNORMAL LOW (ref 5–15)
BUN: 24 mg/dL — ABNORMAL HIGH (ref 8–23)
CO2: 26 mmol/L (ref 22–32)
Calcium: 9.1 mg/dL (ref 8.9–10.3)
Chloride: 109 mmol/L (ref 98–111)
Creatinine: 1.25 mg/dL — ABNORMAL HIGH (ref 0.61–1.24)
GFR, Estimated: 59 mL/min — ABNORMAL LOW (ref >60)
Glucose, Bld: 118 mg/dL — ABNORMAL HIGH (ref 70–99)
Potassium: 4.7 mmol/L (ref 3.5–5.1)
Sodium: 139 mmol/L (ref 135–145)
Total Bilirubin: 0.8 mg/dL (ref 0.3–1.2)
Total Protein: 7.3 g/dL (ref 6.5–8.1)

## 2022-12-10 ENCOUNTER — Encounter: Payer: Self-pay | Admitting: Physician Assistant

## 2022-12-10 ENCOUNTER — Other Ambulatory Visit: Payer: Self-pay | Admitting: Physician Assistant

## 2022-12-10 DIAGNOSIS — R7401 Elevation of levels of liver transaminase levels: Secondary | ICD-10-CM

## 2022-12-13 ENCOUNTER — Ambulatory Visit: Payer: PRIVATE HEALTH INSURANCE

## 2022-12-13 ENCOUNTER — Telehealth: Payer: Self-pay | Admitting: *Deleted

## 2022-12-13 ENCOUNTER — Inpatient Hospital Stay: Payer: Medicare Other | Attending: Internal Medicine

## 2022-12-13 DIAGNOSIS — Z79899 Other long term (current) drug therapy: Secondary | ICD-10-CM | POA: Diagnosis not present

## 2022-12-13 DIAGNOSIS — D469 Myelodysplastic syndrome, unspecified: Secondary | ICD-10-CM

## 2022-12-13 DIAGNOSIS — D72829 Elevated white blood cell count, unspecified: Secondary | ICD-10-CM | POA: Diagnosis present

## 2022-12-13 DIAGNOSIS — D461 Refractory anemia with ring sideroblasts: Secondary | ICD-10-CM | POA: Insufficient documentation

## 2022-12-13 DIAGNOSIS — R7401 Elevation of levels of liver transaminase levels: Secondary | ICD-10-CM | POA: Diagnosis not present

## 2022-12-13 LAB — CBC WITH DIFFERENTIAL/PLATELET
Abs Immature Granulocytes: 0.31 10*3/uL — ABNORMAL HIGH (ref 0.00–0.07)
Basophils Absolute: 0.4 10*3/uL — ABNORMAL HIGH (ref 0.0–0.1)
Basophils Relative: 3 %
Eosinophils Absolute: 0.5 10*3/uL (ref 0.0–0.5)
Eosinophils Relative: 4 %
HCT: 27.5 % — ABNORMAL LOW (ref 39.0–52.0)
Hemoglobin: 8.9 g/dL — ABNORMAL LOW (ref 13.0–17.0)
Immature Granulocytes: 3 %
Lymphocytes Relative: 13 %
Lymphs Abs: 1.6 10*3/uL (ref 0.7–4.0)
MCH: 26.3 pg (ref 26.0–34.0)
MCHC: 32.4 g/dL (ref 30.0–36.0)
MCV: 81.4 fL (ref 80.0–100.0)
Monocytes Absolute: 0.9 10*3/uL (ref 0.1–1.0)
Monocytes Relative: 7 %
Neutro Abs: 8.8 10*3/uL — ABNORMAL HIGH (ref 1.7–7.7)
Neutrophils Relative %: 70 %
Platelets: 421 10*3/uL — ABNORMAL HIGH (ref 150–400)
RBC: 3.38 MIL/uL — ABNORMAL LOW (ref 4.22–5.81)
RDW: 34.5 % — ABNORMAL HIGH (ref 11.5–15.5)
WBC: 12.5 10*3/uL — ABNORMAL HIGH (ref 4.0–10.5)
nRBC: 0 % (ref 0.0–0.2)

## 2022-12-13 LAB — COMPREHENSIVE METABOLIC PANEL
ALT: 31 U/L (ref 0–44)
AST: 10 U/L — ABNORMAL LOW (ref 15–41)
Albumin: 4.4 g/dL (ref 3.5–5.0)
Alkaline Phosphatase: 70 U/L (ref 38–126)
Anion gap: 4 — ABNORMAL LOW (ref 5–15)
BUN: 28 mg/dL — ABNORMAL HIGH (ref 8–23)
CO2: 25 mmol/L (ref 22–32)
Calcium: 9.2 mg/dL (ref 8.9–10.3)
Chloride: 109 mmol/L (ref 98–111)
Creatinine, Ser: 1.24 mg/dL (ref 0.61–1.24)
GFR, Estimated: 60 mL/min — ABNORMAL LOW (ref >60)
Glucose, Bld: 124 mg/dL — ABNORMAL HIGH (ref 70–99)
Potassium: 4.9 mmol/L (ref 3.5–5.1)
Sodium: 138 mmol/L (ref 135–145)
Total Bilirubin: 0.8 mg/dL (ref 0.3–1.2)
Total Protein: 7 g/dL (ref 6.5–8.1)

## 2022-12-13 NOTE — Telephone Encounter (Signed)
TCT patient at [facility] request: Liver enzymes today are within normal limits. [place] was reviewed by Dr. Arbutus Ped. He recommended patient see PCP and/or GI doc to follow up on appearance of liver in [place].  Patient verbalized understanding of information and states he has appt with PCP in late September and will discuss [place] results and recent lab changes with PCP at that time.

## 2022-12-16 ENCOUNTER — Encounter: Payer: Self-pay | Admitting: Medical Oncology

## 2022-12-16 ENCOUNTER — Inpatient Hospital Stay: Payer: Medicare Other

## 2022-12-16 ENCOUNTER — Other Ambulatory Visit: Payer: Self-pay | Admitting: Physician Assistant

## 2022-12-16 VITALS — BP 144/76 | HR 67 | Temp 97.9°F | Resp 18

## 2022-12-16 DIAGNOSIS — D469 Myelodysplastic syndrome, unspecified: Secondary | ICD-10-CM

## 2022-12-16 DIAGNOSIS — R7401 Elevation of levels of liver transaminase levels: Secondary | ICD-10-CM

## 2022-12-16 DIAGNOSIS — D461 Refractory anemia with ring sideroblasts: Secondary | ICD-10-CM | POA: Diagnosis not present

## 2022-12-16 LAB — CBC WITH DIFFERENTIAL/PLATELET
Abs Immature Granulocytes: 0.76 10*3/uL — ABNORMAL HIGH (ref 0.00–0.07)
Basophils Absolute: 0.5 10*3/uL — ABNORMAL HIGH (ref 0.0–0.1)
Basophils Relative: 3 %
Eosinophils Absolute: 0.5 10*3/uL (ref 0.0–0.5)
Eosinophils Relative: 3 %
HCT: 29.4 % — ABNORMAL LOW (ref 39.0–52.0)
Hemoglobin: 9.2 g/dL — ABNORMAL LOW (ref 13.0–17.0)
Immature Granulocytes: 5 %
Lymphocytes Relative: 15 %
Lymphs Abs: 2.2 10*3/uL (ref 0.7–4.0)
MCH: 25.7 pg — ABNORMAL LOW (ref 26.0–34.0)
MCHC: 31.3 g/dL (ref 30.0–36.0)
MCV: 82.1 fL (ref 80.0–100.0)
Monocytes Absolute: 1.1 10*3/uL — ABNORMAL HIGH (ref 0.1–1.0)
Monocytes Relative: 8 %
Neutro Abs: 9.8 10*3/uL — ABNORMAL HIGH (ref 1.7–7.7)
Neutrophils Relative %: 66 %
Platelets: 392 10*3/uL (ref 150–400)
RBC: 3.58 MIL/uL — ABNORMAL LOW (ref 4.22–5.81)
RDW: 34.2 % — ABNORMAL HIGH (ref 11.5–15.5)
WBC Morphology: INCREASED
WBC: 14.8 10*3/uL — ABNORMAL HIGH (ref 4.0–10.5)
nRBC: 0.5 % — ABNORMAL HIGH (ref 0.0–0.2)

## 2022-12-16 LAB — HEPATITIS PANEL, ACUTE
HCV Ab: NONREACTIVE
Hep A IgM: NONREACTIVE
Hep B C IgM: NONREACTIVE
Hepatitis B Surface Ag: NONREACTIVE

## 2022-12-16 LAB — COMPREHENSIVE METABOLIC PANEL
ALT: 18 U/L (ref 0–44)
AST: 11 U/L — ABNORMAL LOW (ref 15–41)
Albumin: 4.4 g/dL (ref 3.5–5.0)
Alkaline Phosphatase: 66 U/L (ref 38–126)
Anion gap: 5 (ref 5–15)
BUN: 23 mg/dL (ref 8–23)
CO2: 25 mmol/L (ref 22–32)
Calcium: 9.1 mg/dL (ref 8.9–10.3)
Chloride: 111 mmol/L (ref 98–111)
Creatinine, Ser: 1.19 mg/dL (ref 0.61–1.24)
GFR, Estimated: >60 mL/min (ref >60)
Glucose, Bld: 94 mg/dL (ref 70–99)
Potassium: 4.2 mmol/L (ref 3.5–5.1)
Sodium: 141 mmol/L (ref 135–145)
Total Bilirubin: 0.8 mg/dL (ref 0.3–1.2)
Total Protein: 7.1 g/dL (ref 6.5–8.1)

## 2022-12-16 MED ORDER — EPOETIN ALFA 40000 UNIT/ML IJ SOLN: 40000.0000 [IU] | Freq: Once | INTRAMUSCULAR | Status: DC

## 2022-12-16 MED ORDER — EPOETIN ALFA-EPBX 40000 UNIT/ML IJ SOLN
40000.0000 [IU] | Freq: Once | INTRAMUSCULAR | Status: AC
  Administered 2022-12-16: 40000 [IU] via SUBCUTANEOUS
  Filled 2022-12-16: qty 1

## 2022-12-16 NOTE — Progress Notes (Unsigned)
HGB 9.2 Roy Johnson , from the lab , called and reported pt's HGB today = 9.2. Reported to the flush nurse.

## 2022-12-16 NOTE — Patient Instructions (Signed)

## 2022-12-20 ENCOUNTER — Ambulatory Visit: Payer: PRIVATE HEALTH INSURANCE

## 2022-12-21 ENCOUNTER — Other Ambulatory Visit: Payer: Self-pay

## 2022-12-23 ENCOUNTER — Inpatient Hospital Stay: Payer: Medicare Other

## 2022-12-23 ENCOUNTER — Ambulatory Visit: Payer: PRIVATE HEALTH INSURANCE

## 2022-12-23 ENCOUNTER — Ambulatory Visit: Payer: Medicare Other | Admitting: Physician Assistant

## 2022-12-23 VITALS — BP 137/67 | HR 67 | Temp 97.6°F | Resp 18

## 2022-12-23 DIAGNOSIS — D469 Myelodysplastic syndrome, unspecified: Secondary | ICD-10-CM

## 2022-12-23 DIAGNOSIS — D461 Refractory anemia with ring sideroblasts: Secondary | ICD-10-CM | POA: Diagnosis not present

## 2022-12-23 LAB — CBC WITH DIFFERENTIAL/PLATELET
Abs Immature Granulocytes: 1.53 10*3/uL — ABNORMAL HIGH (ref 0.00–0.07)
Basophils Absolute: 0.4 10*3/uL — ABNORMAL HIGH (ref 0.0–0.1)
Basophils Relative: 2 %
Eosinophils Absolute: 0.6 10*3/uL — ABNORMAL HIGH (ref 0.0–0.5)
Eosinophils Relative: 3 %
HCT: 30.3 % — ABNORMAL LOW (ref 39.0–52.0)
Hemoglobin: 9.1 g/dL — ABNORMAL LOW (ref 13.0–17.0)
Immature Granulocytes: 7 %
Lymphocytes Relative: 11 %
Lymphs Abs: 2.5 10*3/uL (ref 0.7–4.0)
MCH: 24.7 pg — ABNORMAL LOW (ref 26.0–34.0)
MCHC: 30 g/dL (ref 30.0–36.0)
MCV: 82.1 fL (ref 80.0–100.0)
Monocytes Absolute: 0.9 10*3/uL (ref 0.1–1.0)
Monocytes Relative: 4 %
Neutro Abs: 15.7 10*3/uL — ABNORMAL HIGH (ref 1.7–7.7)
Neutrophils Relative %: 73 %
Platelets: 296 10*3/uL (ref 150–400)
RBC: 3.69 MIL/uL — ABNORMAL LOW (ref 4.22–5.81)
RDW: 35.6 % — ABNORMAL HIGH (ref 11.5–15.5)
WBC: 21.5 10*3/uL — ABNORMAL HIGH (ref 4.0–10.5)
nRBC: 0.4 % — ABNORMAL HIGH (ref 0.0–0.2)

## 2022-12-23 LAB — COMPREHENSIVE METABOLIC PANEL
ALT: 14 U/L (ref 0–44)
AST: 14 U/L — ABNORMAL LOW (ref 15–41)
Albumin: 4.4 g/dL (ref 3.5–5.0)
Alkaline Phosphatase: 64 U/L (ref 38–126)
Anion gap: 6 (ref 5–15)
BUN: 24 mg/dL — ABNORMAL HIGH (ref 8–23)
CO2: 25 mmol/L (ref 22–32)
Calcium: 9.2 mg/dL (ref 8.9–10.3)
Chloride: 110 mmol/L (ref 98–111)
Creatinine, Ser: 1.25 mg/dL — ABNORMAL HIGH (ref 0.61–1.24)
GFR, Estimated: 59 mL/min — ABNORMAL LOW (ref >60)
Glucose, Bld: 96 mg/dL (ref 70–99)
Potassium: 4.6 mmol/L (ref 3.5–5.1)
Sodium: 141 mmol/L (ref 135–145)
Total Bilirubin: 0.7 mg/dL (ref 0.3–1.2)
Total Protein: 7.3 g/dL (ref 6.5–8.1)

## 2022-12-23 MED ORDER — EPOETIN ALFA-EPBX 40000 UNIT/ML IJ SOLN
40000.0000 [IU] | Freq: Once | INTRAMUSCULAR | Status: AC
  Administered 2022-12-23: 40000 [IU] via SUBCUTANEOUS

## 2022-12-27 ENCOUNTER — Ambulatory Visit: Payer: PRIVATE HEALTH INSURANCE

## 2022-12-30 ENCOUNTER — Encounter: Payer: Self-pay | Admitting: Internal Medicine

## 2023-01-02 ENCOUNTER — Inpatient Hospital Stay: Payer: Medicare Other

## 2023-01-02 VITALS — BP 151/75 | HR 71 | Resp 16

## 2023-01-02 DIAGNOSIS — D461 Refractory anemia with ring sideroblasts: Secondary | ICD-10-CM | POA: Diagnosis not present

## 2023-01-02 DIAGNOSIS — D469 Myelodysplastic syndrome, unspecified: Secondary | ICD-10-CM

## 2023-01-02 LAB — CBC WITH DIFFERENTIAL/PLATELET
Abs Immature Granulocytes: 0.62 10*3/uL — ABNORMAL HIGH (ref 0.00–0.07)
Basophils Absolute: 0.2 10*3/uL — ABNORMAL HIGH (ref 0.0–0.1)
Basophils Relative: 1 %
Eosinophils Absolute: 1.6 10*3/uL — ABNORMAL HIGH (ref 0.0–0.5)
Eosinophils Relative: 9 %
HCT: 27.9 % — ABNORMAL LOW (ref 39.0–52.0)
Hemoglobin: 8.6 g/dL — ABNORMAL LOW (ref 13.0–17.0)
Immature Granulocytes: 4 %
Lymphocytes Relative: 12 %
Lymphs Abs: 2 10*3/uL (ref 0.7–4.0)
MCH: 25 pg — ABNORMAL LOW (ref 26.0–34.0)
MCHC: 30.8 g/dL (ref 30.0–36.0)
MCV: 81.1 fL (ref 80.0–100.0)
Monocytes Absolute: 0.7 10*3/uL (ref 0.1–1.0)
Monocytes Relative: 4 %
Neutro Abs: 12.5 10*3/uL — ABNORMAL HIGH (ref 1.7–7.7)
Neutrophils Relative %: 70 %
Platelets: 207 10*3/uL (ref 150–400)
RBC: 3.44 MIL/uL — ABNORMAL LOW (ref 4.22–5.81)
RDW: 35.4 % — ABNORMAL HIGH (ref 11.5–15.5)
WBC: 17.6 10*3/uL — ABNORMAL HIGH (ref 4.0–10.5)
nRBC: 0 % (ref 0.0–0.2)

## 2023-01-02 LAB — COMPREHENSIVE METABOLIC PANEL
ALT: 13 U/L (ref 0–44)
AST: 11 U/L — ABNORMAL LOW (ref 15–41)
Albumin: 4.2 g/dL (ref 3.5–5.0)
Alkaline Phosphatase: 63 U/L (ref 38–126)
Anion gap: 4 — ABNORMAL LOW (ref 5–15)
BUN: 19 mg/dL (ref 8–23)
CO2: 27 mmol/L (ref 22–32)
Calcium: 8.9 mg/dL (ref 8.9–10.3)
Chloride: 109 mmol/L (ref 98–111)
Creatinine, Ser: 1.14 mg/dL (ref 0.61–1.24)
GFR, Estimated: >60 mL/min (ref >60)
Glucose, Bld: 95 mg/dL (ref 70–99)
Potassium: 4.5 mmol/L (ref 3.5–5.1)
Sodium: 140 mmol/L (ref 135–145)
Total Bilirubin: 0.7 mg/dL (ref 0.3–1.2)
Total Protein: 7.1 g/dL (ref 6.5–8.1)

## 2023-01-02 MED ORDER — EPOETIN ALFA-EPBX 40000 UNIT/ML IJ SOLN
40000.0000 [IU] | Freq: Once | INTRAMUSCULAR | Status: AC
  Administered 2023-01-02: 40000 [IU] via SUBCUTANEOUS
  Filled 2023-01-02: qty 1

## 2023-01-02 NOTE — Progress Notes (Signed)
Per lab, Hgb 8.5 today

## 2023-01-03 ENCOUNTER — Ambulatory Visit: Payer: PRIVATE HEALTH INSURANCE

## 2023-01-09 ENCOUNTER — Inpatient Hospital Stay: Payer: Medicare Other

## 2023-01-09 ENCOUNTER — Telehealth: Payer: Self-pay | Admitting: Medical Oncology

## 2023-01-09 ENCOUNTER — Other Ambulatory Visit: Payer: Self-pay | Admitting: Gastroenterology

## 2023-01-09 VITALS — BP 131/77 | HR 75 | Temp 97.7°F | Resp 18

## 2023-01-09 DIAGNOSIS — D469 Myelodysplastic syndrome, unspecified: Secondary | ICD-10-CM

## 2023-01-09 DIAGNOSIS — D461 Refractory anemia with ring sideroblasts: Secondary | ICD-10-CM | POA: Diagnosis not present

## 2023-01-09 LAB — COMPREHENSIVE METABOLIC PANEL
ALT: 14 U/L (ref 0–44)
AST: 11 U/L — ABNORMAL LOW (ref 15–41)
Albumin: 4.3 g/dL (ref 3.5–5.0)
Alkaline Phosphatase: 60 U/L (ref 38–126)
Anion gap: 4 — ABNORMAL LOW (ref 5–15)
BUN: 19 mg/dL (ref 8–23)
CO2: 27 mmol/L (ref 22–32)
Calcium: 9.1 mg/dL (ref 8.9–10.3)
Chloride: 111 mmol/L (ref 98–111)
Creatinine, Ser: 1.2 mg/dL (ref 0.61–1.24)
GFR, Estimated: >60 mL/min (ref >60)
Glucose, Bld: 94 mg/dL (ref 70–99)
Potassium: 4.6 mmol/L (ref 3.5–5.1)
Sodium: 142 mmol/L (ref 135–145)
Total Bilirubin: 0.6 mg/dL (ref 0.3–1.2)
Total Protein: 7 g/dL (ref 6.5–8.1)

## 2023-01-09 LAB — CBC WITH DIFFERENTIAL/PLATELET
Abs Immature Granulocytes: 0.27 10*3/uL — ABNORMAL HIGH (ref 0.00–0.07)
Basophils Absolute: 0.2 10*3/uL — ABNORMAL HIGH (ref 0.0–0.1)
Basophils Relative: 2 %
Eosinophils Absolute: 1.2 10*3/uL — ABNORMAL HIGH (ref 0.0–0.5)
Eosinophils Relative: 9 %
HCT: 26.1 % — ABNORMAL LOW (ref 39.0–52.0)
Hemoglobin: 8 g/dL — ABNORMAL LOW (ref 13.0–17.0)
Immature Granulocytes: 2 %
Lymphocytes Relative: 15 %
Lymphs Abs: 1.9 10*3/uL (ref 0.7–4.0)
MCH: 25 pg — ABNORMAL LOW (ref 26.0–34.0)
MCHC: 30.7 g/dL (ref 30.0–36.0)
MCV: 81.6 fL (ref 80.0–100.0)
Monocytes Absolute: 0.9 10*3/uL (ref 0.1–1.0)
Monocytes Relative: 7 %
Neutro Abs: 8.1 10*3/uL — ABNORMAL HIGH (ref 1.7–7.7)
Neutrophils Relative %: 65 %
Platelets: 312 10*3/uL (ref 150–400)
RBC: 3.2 MIL/uL — ABNORMAL LOW (ref 4.22–5.81)
RDW: 34.6 % — ABNORMAL HIGH (ref 11.5–15.5)
WBC: 12.6 10*3/uL — ABNORMAL HIGH (ref 4.0–10.5)
nRBC: 0.2 % (ref 0.0–0.2)

## 2023-01-09 MED ORDER — EPOETIN ALFA-EPBX 40000 UNIT/ML IJ SOLN
40000.0000 [IU] | Freq: Once | INTRAMUSCULAR | Status: AC
  Administered 2023-01-09: 40000 [IU] via SUBCUTANEOUS
  Filled 2023-01-09: qty 1

## 2023-01-09 NOTE — Patient Instructions (Signed)

## 2023-01-09 NOTE — Telephone Encounter (Signed)
Lab called and reported pt hgb today is 8.0 . Pt arrived to injection appt.

## 2023-01-10 ENCOUNTER — Ambulatory Visit: Payer: PRIVATE HEALTH INSURANCE

## 2023-01-12 ENCOUNTER — Other Ambulatory Visit: Payer: Self-pay | Admitting: Medical Oncology

## 2023-01-12 DIAGNOSIS — D469 Myelodysplastic syndrome, unspecified: Secondary | ICD-10-CM

## 2023-01-16 ENCOUNTER — Inpatient Hospital Stay (HOSPITAL_BASED_OUTPATIENT_CLINIC_OR_DEPARTMENT_OTHER): Payer: Medicare Other | Admitting: Internal Medicine

## 2023-01-16 ENCOUNTER — Inpatient Hospital Stay: Payer: Medicare Other

## 2023-01-16 ENCOUNTER — Other Ambulatory Visit: Payer: Self-pay | Admitting: Medical Oncology

## 2023-01-16 ENCOUNTER — Ambulatory Visit: Payer: PRIVATE HEALTH INSURANCE

## 2023-01-16 ENCOUNTER — Encounter: Payer: Self-pay | Admitting: Internal Medicine

## 2023-01-16 ENCOUNTER — Inpatient Hospital Stay: Payer: Medicare Other | Attending: Internal Medicine

## 2023-01-16 VITALS — BP 144/76 | HR 73 | Resp 16

## 2023-01-16 DIAGNOSIS — Z8546 Personal history of malignant neoplasm of prostate: Secondary | ICD-10-CM | POA: Insufficient documentation

## 2023-01-16 DIAGNOSIS — D469 Myelodysplastic syndrome, unspecified: Secondary | ICD-10-CM

## 2023-01-16 DIAGNOSIS — R053 Chronic cough: Secondary | ICD-10-CM | POA: Insufficient documentation

## 2023-01-16 DIAGNOSIS — Z9079 Acquired absence of other genital organ(s): Secondary | ICD-10-CM | POA: Insufficient documentation

## 2023-01-16 DIAGNOSIS — D72829 Elevated white blood cell count, unspecified: Secondary | ICD-10-CM | POA: Diagnosis present

## 2023-01-16 DIAGNOSIS — Z79899 Other long term (current) drug therapy: Secondary | ICD-10-CM | POA: Diagnosis not present

## 2023-01-16 DIAGNOSIS — Z860100 Personal history of colon polyps, unspecified: Secondary | ICD-10-CM | POA: Insufficient documentation

## 2023-01-16 DIAGNOSIS — Z7961 Long term (current) use of immunomodulator: Secondary | ICD-10-CM | POA: Insufficient documentation

## 2023-01-16 DIAGNOSIS — R630 Anorexia: Secondary | ICD-10-CM | POA: Insufficient documentation

## 2023-01-16 DIAGNOSIS — R5383 Other fatigue: Secondary | ICD-10-CM | POA: Insufficient documentation

## 2023-01-16 DIAGNOSIS — D461 Refractory anemia with ring sideroblasts: Secondary | ICD-10-CM | POA: Insufficient documentation

## 2023-01-16 DIAGNOSIS — Z86018 Personal history of other benign neoplasm: Secondary | ICD-10-CM | POA: Diagnosis not present

## 2023-01-16 DIAGNOSIS — Z9089 Acquired absence of other organs: Secondary | ICD-10-CM | POA: Diagnosis not present

## 2023-01-16 DIAGNOSIS — K219 Gastro-esophageal reflux disease without esophagitis: Secondary | ICD-10-CM | POA: Diagnosis not present

## 2023-01-16 DIAGNOSIS — D473 Essential (hemorrhagic) thrombocythemia: Secondary | ICD-10-CM | POA: Diagnosis not present

## 2023-01-16 DIAGNOSIS — D693 Immune thrombocytopenic purpura: Secondary | ICD-10-CM | POA: Insufficient documentation

## 2023-01-16 DIAGNOSIS — D75839 Thrombocytosis, unspecified: Secondary | ICD-10-CM | POA: Insufficient documentation

## 2023-01-16 LAB — COMPREHENSIVE METABOLIC PANEL
ALT: 13 U/L (ref 0–44)
AST: 12 U/L — ABNORMAL LOW (ref 15–41)
Albumin: 4.3 g/dL (ref 3.5–5.0)
Alkaline Phosphatase: 57 U/L (ref 38–126)
Anion gap: 5 (ref 5–15)
BUN: 19 mg/dL (ref 8–23)
CO2: 27 mmol/L (ref 22–32)
Calcium: 9.3 mg/dL (ref 8.9–10.3)
Chloride: 110 mmol/L (ref 98–111)
Creatinine, Ser: 1.23 mg/dL (ref 0.61–1.24)
GFR, Estimated: >60 mL/min (ref >60)
Glucose, Bld: 93 mg/dL (ref 70–99)
Potassium: 4.6 mmol/L (ref 3.5–5.1)
Sodium: 142 mmol/L (ref 135–145)
Total Bilirubin: 0.9 mg/dL (ref 0.3–1.2)
Total Protein: 7.2 g/dL (ref 6.5–8.1)

## 2023-01-16 LAB — CBC WITH DIFFERENTIAL (CANCER CENTER ONLY)
Abs Immature Granulocytes: 0.84 10*3/uL — ABNORMAL HIGH (ref 0.00–0.07)
Basophils Absolute: 0.4 10*3/uL — ABNORMAL HIGH (ref 0.0–0.1)
Basophils Relative: 3 %
Eosinophils Absolute: 0.5 10*3/uL (ref 0.0–0.5)
Eosinophils Relative: 3 %
HCT: 25.4 % — ABNORMAL LOW (ref 39.0–52.0)
Hemoglobin: 8 g/dL — ABNORMAL LOW (ref 13.0–17.0)
Immature Granulocytes: 6 %
Lymphocytes Relative: 14 %
Lymphs Abs: 1.9 10*3/uL (ref 0.7–4.0)
MCH: 25.5 pg — ABNORMAL LOW (ref 26.0–34.0)
MCHC: 31.5 g/dL (ref 30.0–36.0)
MCV: 80.9 fL (ref 80.0–100.0)
Monocytes Absolute: 1.1 10*3/uL — ABNORMAL HIGH (ref 0.1–1.0)
Monocytes Relative: 8 %
Neutro Abs: 9 10*3/uL — ABNORMAL HIGH (ref 1.7–7.7)
Neutrophils Relative %: 66 %
Platelet Count: 347 10*3/uL (ref 150–400)
RBC: 3.14 MIL/uL — ABNORMAL LOW (ref 4.22–5.81)
RDW: 35 % — ABNORMAL HIGH (ref 11.5–15.5)
WBC Count: 13.7 10*3/uL — ABNORMAL HIGH (ref 4.0–10.5)
nRBC: 0.9 % — ABNORMAL HIGH (ref 0.0–0.2)

## 2023-01-16 LAB — PREPARE RBC (CROSSMATCH)

## 2023-01-16 LAB — SAMPLE TO BLOOD BANK

## 2023-01-16 MED ORDER — SODIUM CHLORIDE 0.9% IV SOLUTION
250.0000 mL | Freq: Once | INTRAVENOUS | Status: AC
  Administered 2023-01-16: 250 mL via INTRAVENOUS

## 2023-01-16 MED ORDER — ACETAMINOPHEN 325 MG PO TABS: 650.0000 mg | ORAL_TABLET | Freq: Four times a day (QID) | ORAL | Status: DC | PRN

## 2023-01-16 MED ORDER — ACETAMINOPHEN 325 MG PO TABS
650.0000 mg | ORAL_TABLET | Freq: Once | ORAL | Status: AC
  Administered 2023-01-16: 650 mg via ORAL
  Filled 2023-01-16: qty 2

## 2023-01-16 MED ORDER — DIPHENHYDRAMINE HCL 25 MG PO CAPS: 25.0000 mg | ORAL_CAPSULE | Freq: Once | ORAL | Status: DC

## 2023-01-16 MED ORDER — EPOETIN ALFA-EPBX 40000 UNIT/ML IJ SOLN
40000.0000 [IU] | Freq: Once | INTRAMUSCULAR | Status: AC
  Administered 2023-01-16: 40000 [IU] via SUBCUTANEOUS
  Filled 2023-01-16: qty 1

## 2023-01-16 NOTE — Patient Instructions (Signed)

## 2023-01-16 NOTE — Progress Notes (Signed)
[facility]  Telephone:([phone] Fax:([phone]) [phone]  OFFICE PROGRESS NOTE  Garlan Fillers, MD [address] 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   PRIOR THERAPY: Previous treatment with combination of Hydrea and anagrelide.  CURRENT THERAPY:  1) Revlimid 5 mg by mouth daily 2 weeks on and 2 weeks off.  Started 03/25/2016.  Managed by Dr. Anise Salvo at [facility] 2) anagrelide 0.5 mg p.o. twice daily. 3) Aranesp 300 mcg subcutaneously every 3 weeks.  This is switched to Procrit 400 mcg subcutaneously every 2 weeks starting April 13, 2017.  The frequency of the Procrit was changed to weekly schedule when he was in [place].  He is currently on Retacrit weekly as a biosimilar to Procrit.  INTERVAL HISTORY: Roy Johnson 77 y.o. male returns to the clinic today for follow-up visit.Discussed the use of AI scribe software for clinical note transcription with the patient, who gave verbal consent to proceed.  History of Present Illness   Roy Johnson, a 77 year old patient with a history of essential thrombocytopenia, JAK2 mutation, and myelodysplastic syndrome (MDS), presents with a recent drop in hemoglobin levels. The patient reports experiencing normal fatigue and a persistent cough. He has been on weekly Retacrit injections for several years, but the efficacy appears to be decreasing. The patient also attempted to adjust the schedule of his Retacrit injections due to a vacation, extending the interval between doses to eight days for a couple of weeks.  The patient's appetite has significantly decreased, leading to a weight loss of four pounds since the last visit. Despite the lack of appetite, he is making an effort to consume at least two meals a day. The patient also reports bowel  irregularities, stating he hasn't had a good bowel movement in a while.  The patient's fatigue persists, but he notes that it is less noticeable when he is moving around. Despite this, the patient appears more pale than previous visits. The patient is due to spend several months in [place] starting in January, and there is discussion about the potential need for a transfusion depending on the results of the patient's recent lab work.       MEDICAL HISTORY: Past Medical History:  Diagnosis Date   Anemia    Anesthesia of skin    Calculus of gallbladder with acute cholecystitis, with obstruction    Cellulitis of back    Chest pain    Chronic kidney disease (CKD), stage III (moderate) (HCC)    Colon polyp    Cough    Disorientation    Diverticulitis    Diverticulosis    Dyspnea on exertion    Elevated white blood cell count    Fatigue    Forgetfulness    GERD (gastroesophageal reflux disease)    Hearing loss    HTN (hypertension)    Hydrocele    Hyperkalemia    Hyperlipidemia    Hypertensive chronic kidney disease with stage 1 through stage 4 chronic kidney disease, or unspecified chronic kidney disease    Hypokalemia    IBS (irritable bowel syndrome)    Impacted cerumen, bilateral    Inguinal hernia    Insomnia    Lightheadedness    Liver disease    Lower abdominal pain    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  MEDICATIONS:  Current Outpatient Medications  Medication Sig Dispense Refill   amLODipine (NORVASC) 5 MG tablet Take 5 mg by mouth daily.  2   anagrelide (AGRYLIN) 1 MG capsule TAKE 1 CAPSULE(1 MG) BY MOUTH DAILY 90 capsule 0   cetirizine (ZYRTEC) 10 MG tablet TAKE 1 TABLET(10 MG) BY MOUTH DAILY     cholestyramine (QUESTRAN) 4 g packet MIX AND DRINK 1 PACKET(4 GRAMS) BY MOUTH DAILY 30 each 2   colestipol (COLESTID) 1 g tablet TAKE 2 TABLETS(2 GRAMS) BY MOUTH TWICE DAILY 360 tablet 3   diphenoxylate-atropine (LOMOTIL) 2.5-0.025 MG tablet Take 1 tablet by mouth 4 (four) times daily as needed. (Patient not taking: Reported on 11/22/2022)     epoetin alfa-epbx (RETACRIT) 98119 UNIT/ML injection Inject into the skin.     gabapentin (NEURONTIN) 100 MG capsule TAKE 1 CAPSULE BY MOUTH TWICE DAILY AND 2 AT NIGHT 360 capsule 1   hydrocortisone (ANUSOL-HC) 2.5 % rectal cream Place 1 Application rectally 3 (three) times daily as needed for hemorrhoids or anal itching. (Patient not taking: Reported on 11/22/2022) 30 g 0   hyoscyamine (LEVSIN SL) 0.125 MG SL tablet DISSOLVE 1 TABLET(0.125 MG) UNDER THE TONGUE EVERY 4 HOURS AS NEEDED (Patient not taking: Reported on 11/22/2022) 90 tablet 3   lenalidomide (REVLIMID) 5 MG capsule Take 5 mg by mouth daily.     Multiple Vitamin (MULTI-VITAMINS) TABS Take 1 tablet by mouth daily.     Probiotic Product (ALIGN) 4 MG CAPS Take 1 capsule by mouth daily.     No current facility-administered medications for this visit.   Facility-Administered Medications Ordered in Other Visits  Medication Dose Route Frequency Provider Last Rate Last Admin   0.9 %  sodium chloride infusion  250 mL Intravenous Once Si Gaul, MD        SURGICAL HISTORY:  Past Surgical History:  Procedure Laterality Date   COLONOSCOPY     POLYPECTOMY     PROSTATECTOMY     TONSILLECTOMY      REVIEW OF SYSTEMS:  Constitutional: positive for fatigue and weight loss Eyes:  negative Ears, nose, mouth, throat, and face: negative Respiratory: positive for cough Cardiovascular: negative Gastrointestinal: positive for change in bowel habits Genitourinary:negative Integument/breast: negative Hematologic/lymphatic: negative Musculoskeletal:negative Neurological: negative Behavioral/Psych: negative Endocrine: negative Allergic/Immunologic: negative   PHYSICAL EXAMINATION: General appearance: alert, cooperative, appears stated age, fatigued, and no distress Head: Normocephalic, without obvious abnormality, atraumatic Neck: no adenopathy, no JVD, supple, symmetrical, trachea midline, and thyroid not enlarged, symmetric, no tenderness/mass/nodules Lymph nodes: Cervical, supraclavicular, and axillary nodes normal. Resp: clear to auscultation bilaterally Back: symmetric, no curvature. ROM normal. No CVA tenderness. Cardio: regular rate and rhythm, S1, S2 normal, no murmur, click, rub or gallop GI: soft, non-tender; bowel sounds normal; no masses,  no organomegaly Extremities: extremities normal, atraumatic, no cyanosis or edema Neurologic: Alert  and oriented X 3, normal strength and tone. Normal symmetric reflexes. Normal coordination and gait  ECOG PERFORMANCE STATUS: 1 - Symptomatic but completely ambulatory  Blood pressure (!) 147/81, pulse 75, temperature 97.7 F (36.5 C), temperature source Oral, resp. rate 18, height 5\' 9"  (1.753 m), weight 154 lb 1.6 oz (69.9 kg), SpO2 100%.  LABORATORY DATA:  Lab Results  Component Value Date   WBC 12.6 (H) 01/09/2023   HGB 8.0 (L) 01/09/2023   HCT 26.1 (L) 01/09/2023   MCV 81.6 01/09/2023   PLT 312 01/09/2023      Chemistry      Component Value Date/Time   NA 142 01/09/2023 0828   NA 141 12/08/2016 0928   K 4.6 01/09/2023 0828   K 4.5 12/08/2016 0928   CL 111 01/09/2023 0828   CO2 27 01/09/2023 0828   CO2 27 12/08/2016 0928   BUN 19 01/09/2023 0828   BUN 14.1 12/08/2016 0928   CREATININE 1.20 01/09/2023  0828   CREATININE 1.25 (H) 12/09/2022 1007   CREATININE 0.9 12/08/2016 0928      Component Value Date/Time   CALCIUM 9.1 01/09/2023 0828   CALCIUM 9.0 12/08/2016 0928   ALKPHOS 60 01/09/2023 0828   ALKPHOS 87 12/08/2016 0928   AST 11 (L) 01/09/2023 0828   AST 21 12/09/2022 1007   AST 25 12/08/2016 0928   ALT 14 01/09/2023 0828   ALT 130 (H) 12/09/2022 1007   ALT 41 12/08/2016 0928   BILITOT 0.6 01/09/2023 0828   BILITOT 0.8 12/09/2022 1007   BILITOT 0.84 12/08/2016 0928      RADIOGRAPHIC STUDIES:  ASSESSMENT AND PLAN:  This is a very pleasant 77 years old white male with essential thrombocythemia with positive JAK-2 mutation as well as myelodysplastic syndrome. He is currently on treatment with Revlimid 5 mg by mouth daily according to the recommendation from [facility].  He is also on treatment with Aranesp every 3 weeks.  Aranesp was a change it to Procrit initially every 2 weeks and currently on weekly basis.  He mentioned that he is feeling much better on Procrit weekly  The patient has been on treatment with Retacrit 40,000 unit weekly for more than 3 years.  He has been tolerating this treatment well. I recommended for the patient to continue with the weekly Procrit as planned.  He is also on treatment with anagrelide and Revlimid. Assessment and Plan    Myelodysplastic Syndrome (MDS) Persistent anemia despite weekly Retacrit injections. Hemoglobin levels have been decreasing, with the most recent level at 8.0. Patient reports fatigue and pallor. -Continue weekly Retacrit injections. -Check hemoglobin level today and consider blood transfusion if hemoglobin is less than 8.0. -Discussed potential future use of Luspatercept if patient requires more frequent transfusions despite Retacrit.  Essential Thrombocythemia Stable on current treatment with Revlimid. -Continue Revlimid.  Gastrointestinal Issues Patient reports poor appetite and irregular bowel  movements. -Encourage patient to maintain at least two meals a day to prevent further weight loss. -Consider further evaluation if bowel irregularities persist.  Follow-up Schedule appointment in late December before patient leaves for [place] in January.   The patient was advised to call immediately if he has any other concerning symptoms in the interval. All questions were answered. The patient knows to call the clinic with any problems, questions or concerns. We can certainly see the patient much sooner if necessary.   Disclaimer: This note was dictated with voice recognition software. Similar sounding words can inadvertently be transcribed  and may not be corrected upon review.

## 2023-01-17 ENCOUNTER — Ambulatory Visit: Payer: PRIVATE HEALTH INSURANCE

## 2023-01-17 LAB — TYPE AND SCREEN
ABO/RH(D): O POS
Antibody Screen: NEGATIVE
Unit division: 0

## 2023-01-17 LAB — BPAM RBC
Blood Product Expiration Date: 202411042359
ISSUE DATE / TIME: 202410071057
Unit Type and Rh: 5100

## 2023-01-19 ENCOUNTER — Inpatient Hospital Stay: Payer: Medicare Other

## 2023-01-19 ENCOUNTER — Inpatient Hospital Stay: Payer: Medicare Other | Admitting: Physician Assistant

## 2023-01-22 ENCOUNTER — Other Ambulatory Visit: Payer: Self-pay

## 2023-01-23 ENCOUNTER — Inpatient Hospital Stay: Payer: Medicare Other

## 2023-01-23 VITALS — BP 131/70 | HR 65 | Temp 97.7°F | Resp 16

## 2023-01-23 DIAGNOSIS — D469 Myelodysplastic syndrome, unspecified: Secondary | ICD-10-CM

## 2023-01-23 DIAGNOSIS — D461 Refractory anemia with ring sideroblasts: Secondary | ICD-10-CM | POA: Diagnosis not present

## 2023-01-23 LAB — CBC WITH DIFFERENTIAL/PLATELET
Abs Immature Granulocytes: 1.51 10*3/uL — ABNORMAL HIGH (ref 0.00–0.07)
Basophils Absolute: 0.4 10*3/uL — ABNORMAL HIGH (ref 0.0–0.1)
Basophils Relative: 2 %
Eosinophils Absolute: 0.9 10*3/uL — ABNORMAL HIGH (ref 0.0–0.5)
Eosinophils Relative: 4 %
HCT: 31.1 % — ABNORMAL LOW (ref 39.0–52.0)
Hemoglobin: 9.5 g/dL — ABNORMAL LOW (ref 13.0–17.0)
Immature Granulocytes: 7 %
Lymphocytes Relative: 12 %
Lymphs Abs: 2.5 10*3/uL (ref 0.7–4.0)
MCH: 25.5 pg — ABNORMAL LOW (ref 26.0–34.0)
MCHC: 30.5 g/dL (ref 30.0–36.0)
MCV: 83.6 fL (ref 80.0–100.0)
Monocytes Absolute: 0.7 10*3/uL (ref 0.1–1.0)
Monocytes Relative: 3 %
Neutro Abs: 15.7 10*3/uL — ABNORMAL HIGH (ref 1.7–7.7)
Neutrophils Relative %: 72 %
Platelets: 326 10*3/uL (ref 150–400)
RBC: 3.72 MIL/uL — ABNORMAL LOW (ref 4.22–5.81)
RDW: 33.2 % — ABNORMAL HIGH (ref 11.5–15.5)
WBC: 21.7 10*3/uL — ABNORMAL HIGH (ref 4.0–10.5)
nRBC: 0.2 % (ref 0.0–0.2)

## 2023-01-23 LAB — COMPREHENSIVE METABOLIC PANEL
ALT: 9 U/L (ref 0–44)
AST: 10 U/L — ABNORMAL LOW (ref 15–41)
Albumin: 4.3 g/dL (ref 3.5–5.0)
Alkaline Phosphatase: 62 U/L (ref 38–126)
Anion gap: 4 — ABNORMAL LOW (ref 5–15)
BUN: 22 mg/dL (ref 8–23)
CO2: 26 mmol/L (ref 22–32)
Calcium: 9.3 mg/dL (ref 8.9–10.3)
Chloride: 109 mmol/L (ref 98–111)
Creatinine, Ser: 1.2 mg/dL (ref 0.61–1.24)
GFR, Estimated: >60 mL/min (ref >60)
Glucose, Bld: 97 mg/dL (ref 70–99)
Potassium: 4.3 mmol/L (ref 3.5–5.1)
Sodium: 139 mmol/L (ref 135–145)
Total Bilirubin: 0.6 mg/dL (ref 0.3–1.2)
Total Protein: 7.3 g/dL (ref 6.5–8.1)

## 2023-01-23 MED ORDER — EPOETIN ALFA-EPBX 40000 UNIT/ML IJ SOLN
40000.0000 [IU] | Freq: Once | INTRAMUSCULAR | Status: AC
  Administered 2023-01-23: 40000 [IU] via SUBCUTANEOUS
  Filled 2023-01-23: qty 1

## 2023-01-30 ENCOUNTER — Inpatient Hospital Stay: Payer: Medicare Other

## 2023-01-30 VITALS — BP 139/71 | HR 72 | Temp 97.6°F

## 2023-01-30 DIAGNOSIS — D469 Myelodysplastic syndrome, unspecified: Secondary | ICD-10-CM

## 2023-01-30 DIAGNOSIS — D461 Refractory anemia with ring sideroblasts: Secondary | ICD-10-CM | POA: Diagnosis not present

## 2023-01-30 LAB — COMPREHENSIVE METABOLIC PANEL
ALT: 11 U/L (ref 0–44)
AST: 9 U/L — ABNORMAL LOW (ref 15–41)
Albumin: 4.4 g/dL (ref 3.5–5.0)
Alkaline Phosphatase: 57 U/L (ref 38–126)
Anion gap: 5 (ref 5–15)
BUN: 29 mg/dL — ABNORMAL HIGH (ref 8–23)
CO2: 26 mmol/L (ref 22–32)
Calcium: 9.5 mg/dL (ref 8.9–10.3)
Chloride: 110 mmol/L (ref 98–111)
Creatinine, Ser: 1.26 mg/dL — ABNORMAL HIGH (ref 0.61–1.24)
GFR, Estimated: 59 mL/min — ABNORMAL LOW (ref >60)
Glucose, Bld: 91 mg/dL (ref 70–99)
Potassium: 4.7 mmol/L (ref 3.5–5.1)
Sodium: 141 mmol/L (ref 135–145)
Total Bilirubin: 0.6 mg/dL (ref 0.3–1.2)
Total Protein: 6.9 g/dL (ref 6.5–8.1)

## 2023-01-30 LAB — CBC WITH DIFFERENTIAL/PLATELET
Abs Immature Granulocytes: 1.05 10*3/uL — ABNORMAL HIGH (ref 0.00–0.07)
Basophils Absolute: 0.3 10*3/uL — ABNORMAL HIGH (ref 0.0–0.1)
Basophils Relative: 1 %
Eosinophils Absolute: 1.6 10*3/uL — ABNORMAL HIGH (ref 0.0–0.5)
Eosinophils Relative: 8 %
HCT: 30.8 % — ABNORMAL LOW (ref 39.0–52.0)
Hemoglobin: 9.5 g/dL — ABNORMAL LOW (ref 13.0–17.0)
Immature Granulocytes: 5 %
Lymphocytes Relative: 12 %
Lymphs Abs: 2.5 10*3/uL (ref 0.7–4.0)
MCH: 25.3 pg — ABNORMAL LOW (ref 26.0–34.0)
MCHC: 30.8 g/dL (ref 30.0–36.0)
MCV: 82.1 fL (ref 80.0–100.0)
Monocytes Absolute: 1 10*3/uL (ref 0.1–1.0)
Monocytes Relative: 5 %
Neutro Abs: 14 10*3/uL — ABNORMAL HIGH (ref 1.7–7.7)
Neutrophils Relative %: 69 %
Platelets: 314 10*3/uL (ref 150–400)
RBC: 3.75 MIL/uL — ABNORMAL LOW (ref 4.22–5.81)
RDW: 33.7 % — ABNORMAL HIGH (ref 11.5–15.5)
WBC: 20.3 10*3/uL — ABNORMAL HIGH (ref 4.0–10.5)
nRBC: 0.1 % (ref 0.0–0.2)

## 2023-01-30 MED ORDER — EPOETIN ALFA-EPBX 40000 UNIT/ML IJ SOLN
40000.0000 [IU] | Freq: Once | INTRAMUSCULAR | Status: AC
  Administered 2023-01-30: 40000 [IU] via SUBCUTANEOUS
  Filled 2023-01-30: qty 1

## 2023-01-31 ENCOUNTER — Ambulatory Visit: Payer: PRIVATE HEALTH INSURANCE

## 2023-02-03 ENCOUNTER — Telehealth: Payer: Self-pay | Admitting: Internal Medicine

## 2023-02-03 NOTE — Telephone Encounter (Signed)
Rescheduled 10/28 appointment times, called patient and left a voicemail.

## 2023-02-06 ENCOUNTER — Inpatient Hospital Stay: Payer: Medicare Other

## 2023-02-06 VITALS — BP 143/73 | HR 82 | Temp 98.3°F | Resp 18

## 2023-02-06 DIAGNOSIS — D461 Refractory anemia with ring sideroblasts: Secondary | ICD-10-CM | POA: Diagnosis not present

## 2023-02-06 DIAGNOSIS — D469 Myelodysplastic syndrome, unspecified: Secondary | ICD-10-CM

## 2023-02-06 LAB — CBC WITH DIFFERENTIAL/PLATELET
Abs Immature Granulocytes: 0.41 10*3/uL — ABNORMAL HIGH (ref 0.00–0.07)
Basophils Absolute: 0.2 10*3/uL — ABNORMAL HIGH (ref 0.0–0.1)
Basophils Relative: 1 %
Eosinophils Absolute: 1.8 10*3/uL — ABNORMAL HIGH (ref 0.0–0.5)
Eosinophils Relative: 12 %
HCT: 28.3 % — ABNORMAL LOW (ref 39.0–52.0)
Hemoglobin: 8.9 g/dL — ABNORMAL LOW (ref 13.0–17.0)
Immature Granulocytes: 3 %
Lymphocytes Relative: 13 %
Lymphs Abs: 2 10*3/uL (ref 0.7–4.0)
MCH: 24.9 pg — ABNORMAL LOW (ref 26.0–34.0)
MCHC: 31.4 g/dL (ref 30.0–36.0)
MCV: 79.3 fL — ABNORMAL LOW (ref 80.0–100.0)
Monocytes Absolute: 0.9 10*3/uL (ref 0.1–1.0)
Monocytes Relative: 6 %
Neutro Abs: 9.4 10*3/uL — ABNORMAL HIGH (ref 1.7–7.7)
Neutrophils Relative %: 65 %
Platelets: 267 10*3/uL (ref 150–400)
RBC: 3.57 MIL/uL — ABNORMAL LOW (ref 4.22–5.81)
RDW: 35.5 % — ABNORMAL HIGH (ref 11.5–15.5)
WBC: 14.7 10*3/uL — ABNORMAL HIGH (ref 4.0–10.5)
nRBC: 0.2 % (ref 0.0–0.2)

## 2023-02-06 LAB — COMPREHENSIVE METABOLIC PANEL
ALT: 16 U/L (ref 0–44)
AST: 11 U/L — ABNORMAL LOW (ref 15–41)
Albumin: 4.4 g/dL (ref 3.5–5.0)
Alkaline Phosphatase: 52 U/L (ref 38–126)
Anion gap: 6 (ref 5–15)
BUN: 26 mg/dL — ABNORMAL HIGH (ref 8–23)
CO2: 25 mmol/L (ref 22–32)
Calcium: 9 mg/dL (ref 8.9–10.3)
Chloride: 107 mmol/L (ref 98–111)
Creatinine, Ser: 1.26 mg/dL — ABNORMAL HIGH (ref 0.61–1.24)
GFR, Estimated: 59 mL/min — ABNORMAL LOW (ref >60)
Glucose, Bld: 107 mg/dL — ABNORMAL HIGH (ref 70–99)
Potassium: 4.2 mmol/L (ref 3.5–5.1)
Sodium: 138 mmol/L (ref 135–145)
Total Bilirubin: 0.8 mg/dL (ref 0.3–1.2)
Total Protein: 7.2 g/dL (ref 6.5–8.1)

## 2023-02-06 MED ORDER — EPOETIN ALFA-EPBX 40000 UNIT/ML IJ SOLN
40000.0000 [IU] | Freq: Once | INTRAMUSCULAR | Status: AC
  Administered 2023-02-06: 40000 [IU] via SUBCUTANEOUS

## 2023-02-07 ENCOUNTER — Ambulatory Visit: Payer: PRIVATE HEALTH INSURANCE

## 2023-02-13 ENCOUNTER — Inpatient Hospital Stay: Payer: Medicare Other

## 2023-02-13 ENCOUNTER — Inpatient Hospital Stay: Payer: Medicare Other | Attending: Internal Medicine

## 2023-02-13 VITALS — BP 149/74 | HR 72 | Temp 97.8°F | Resp 18

## 2023-02-13 DIAGNOSIS — D461 Refractory anemia with ring sideroblasts: Secondary | ICD-10-CM | POA: Diagnosis present

## 2023-02-13 DIAGNOSIS — D72829 Elevated white blood cell count, unspecified: Secondary | ICD-10-CM | POA: Diagnosis present

## 2023-02-13 DIAGNOSIS — Z79899 Other long term (current) drug therapy: Secondary | ICD-10-CM | POA: Diagnosis not present

## 2023-02-13 DIAGNOSIS — D469 Myelodysplastic syndrome, unspecified: Secondary | ICD-10-CM

## 2023-02-13 LAB — CBC WITH DIFFERENTIAL (CANCER CENTER ONLY)
Abs Immature Granulocytes: 0.19 10*3/uL — ABNORMAL HIGH (ref 0.00–0.07)
Basophils Absolute: 0.3 10*3/uL — ABNORMAL HIGH (ref 0.0–0.1)
Basophils Relative: 3 %
Eosinophils Absolute: 1.1 10*3/uL — ABNORMAL HIGH (ref 0.0–0.5)
Eosinophils Relative: 11 %
HCT: 26.8 % — ABNORMAL LOW (ref 39.0–52.0)
Hemoglobin: 8.4 g/dL — ABNORMAL LOW (ref 13.0–17.0)
Immature Granulocytes: 2 %
Lymphocytes Relative: 18 %
Lymphs Abs: 1.8 10*3/uL (ref 0.7–4.0)
MCH: 25.5 pg — ABNORMAL LOW (ref 26.0–34.0)
MCHC: 31.3 g/dL (ref 30.0–36.0)
MCV: 81.2 fL (ref 80.0–100.0)
Monocytes Absolute: 0.7 10*3/uL (ref 0.1–1.0)
Monocytes Relative: 7 %
Neutro Abs: 5.9 10*3/uL (ref 1.7–7.7)
Neutrophils Relative %: 59 %
Platelet Count: 277 10*3/uL (ref 150–400)
RBC: 3.3 MIL/uL — ABNORMAL LOW (ref 4.22–5.81)
RDW: 34.4 % — ABNORMAL HIGH (ref 11.5–15.5)
WBC Count: 10 10*3/uL (ref 4.0–10.5)
nRBC: 0.3 % — ABNORMAL HIGH (ref 0.0–0.2)

## 2023-02-13 MED ORDER — EPOETIN ALFA-EPBX 40000 UNIT/ML IJ SOLN
40000.0000 [IU] | Freq: Once | INTRAMUSCULAR | Status: AC
Start: 2023-02-13 — End: 2023-02-13
  Administered 2023-02-13: 40000 [IU] via SUBCUTANEOUS
  Filled 2023-02-13: qty 1

## 2023-02-14 ENCOUNTER — Ambulatory Visit: Payer: PRIVATE HEALTH INSURANCE

## 2023-02-20 ENCOUNTER — Telehealth: Payer: Self-pay | Admitting: *Deleted

## 2023-02-20 ENCOUNTER — Inpatient Hospital Stay: Payer: Medicare Other

## 2023-02-20 VITALS — BP 139/65 | HR 73 | Temp 98.0°F | Resp 17

## 2023-02-20 DIAGNOSIS — D469 Myelodysplastic syndrome, unspecified: Secondary | ICD-10-CM

## 2023-02-20 DIAGNOSIS — D461 Refractory anemia with ring sideroblasts: Secondary | ICD-10-CM | POA: Diagnosis not present

## 2023-02-20 LAB — CBC WITH DIFFERENTIAL/PLATELET
Abs Immature Granulocytes: 0.63 10*3/uL — ABNORMAL HIGH (ref 0.00–0.07)
Basophils Absolute: 0.6 10*3/uL — ABNORMAL HIGH (ref 0.0–0.1)
Basophils Relative: 4 %
Eosinophils Absolute: 0.6 10*3/uL — ABNORMAL HIGH (ref 0.0–0.5)
Eosinophils Relative: 4 %
HCT: 27.3 % — ABNORMAL LOW (ref 39.0–52.0)
Hemoglobin: 8.4 g/dL — ABNORMAL LOW (ref 13.0–17.0)
Immature Granulocytes: 5 %
Lymphocytes Relative: 15 %
Lymphs Abs: 2.2 10*3/uL (ref 0.7–4.0)
MCH: 25.2 pg — ABNORMAL LOW (ref 26.0–34.0)
MCHC: 30.8 g/dL (ref 30.0–36.0)
MCV: 82 fL (ref 80.0–100.0)
Monocytes Absolute: 1.1 10*3/uL — ABNORMAL HIGH (ref 0.1–1.0)
Monocytes Relative: 8 %
Neutro Abs: 9.1 10*3/uL — ABNORMAL HIGH (ref 1.7–7.7)
Neutrophils Relative %: 64 %
Platelets: 331 10*3/uL (ref 150–400)
RBC: 3.33 MIL/uL — ABNORMAL LOW (ref 4.22–5.81)
RDW: 35 % — ABNORMAL HIGH (ref 11.5–15.5)
WBC: 14.1 10*3/uL — ABNORMAL HIGH (ref 4.0–10.5)
nRBC: 0.8 % — ABNORMAL HIGH (ref 0.0–0.2)

## 2023-02-20 MED ORDER — EPOETIN ALFA-EPBX 40000 UNIT/ML IJ SOLN
40000.0000 [IU] | Freq: Once | INTRAMUSCULAR | Status: AC
Start: 2023-02-20 — End: 2023-02-20
  Administered 2023-02-20: 40000 [IU] via SUBCUTANEOUS
  Filled 2023-02-20: qty 1

## 2023-02-20 NOTE — Telephone Encounter (Signed)
Notified by Mathews Argyle w/[facility] -- HGB 8.4 Patient scheduled for epoetin injection this am in [facility] Flush/Injection Room. Nurses in [facility] Flush/Injection Room notified of result - information acknowledged

## 2023-02-21 ENCOUNTER — Ambulatory Visit: Payer: PRIVATE HEALTH INSURANCE

## 2023-02-22 ENCOUNTER — Other Ambulatory Visit: Payer: Self-pay | Admitting: Physician Assistant

## 2023-02-22 ENCOUNTER — Encounter: Payer: Self-pay | Admitting: Internal Medicine

## 2023-02-27 ENCOUNTER — Inpatient Hospital Stay: Payer: Medicare Other

## 2023-02-27 VITALS — BP 151/80 | HR 74 | Temp 97.9°F | Resp 18

## 2023-02-27 DIAGNOSIS — D461 Refractory anemia with ring sideroblasts: Secondary | ICD-10-CM | POA: Diagnosis not present

## 2023-02-27 DIAGNOSIS — D469 Myelodysplastic syndrome, unspecified: Secondary | ICD-10-CM

## 2023-02-27 LAB — CBC WITH DIFFERENTIAL (CANCER CENTER ONLY)
Abs Immature Granulocytes: 0.86 10*3/uL — ABNORMAL HIGH (ref 0.00–0.07)
Basophils Absolute: 0.3 10*3/uL — ABNORMAL HIGH (ref 0.0–0.1)
Basophils Relative: 2 %
Eosinophils Absolute: 1.2 10*3/uL — ABNORMAL HIGH (ref 0.0–0.5)
Eosinophils Relative: 6 %
HCT: 31.1 % — ABNORMAL LOW (ref 39.0–52.0)
Hemoglobin: 9.3 g/dL — ABNORMAL LOW (ref 13.0–17.0)
Immature Granulocytes: 4 %
Lymphocytes Relative: 13 %
Lymphs Abs: 2.6 10*3/uL (ref 0.7–4.0)
MCH: 25.5 pg — ABNORMAL LOW (ref 26.0–34.0)
MCHC: 29.9 g/dL — ABNORMAL LOW (ref 30.0–36.0)
MCV: 85.2 fL (ref 80.0–100.0)
Monocytes Absolute: 0.7 10*3/uL (ref 0.1–1.0)
Monocytes Relative: 3 %
Neutro Abs: 14.4 10*3/uL — ABNORMAL HIGH (ref 1.7–7.7)
Neutrophils Relative %: 72 %
Platelet Count: 300 10*3/uL (ref 150–400)
RBC: 3.65 MIL/uL — ABNORMAL LOW (ref 4.22–5.81)
RDW: 35 % — ABNORMAL HIGH (ref 11.5–15.5)
WBC Count: 20.1 10*3/uL — ABNORMAL HIGH (ref 4.0–10.5)
nRBC: 0.2 % (ref 0.0–0.2)

## 2023-02-27 MED ORDER — EPOETIN ALFA-EPBX 40000 UNIT/ML IJ SOLN
40000.0000 [IU] | Freq: Once | INTRAMUSCULAR | Status: AC
Start: 2023-02-27 — End: 2023-02-27
  Administered 2023-02-27: 40000 [IU] via SUBCUTANEOUS
  Filled 2023-02-27: qty 1

## 2023-02-28 ENCOUNTER — Ambulatory Visit: Payer: PRIVATE HEALTH INSURANCE

## 2023-03-02 ENCOUNTER — Ambulatory Visit: Payer: Medicare Other | Admitting: Neurology

## 2023-03-06 ENCOUNTER — Inpatient Hospital Stay: Payer: Medicare Other

## 2023-03-06 VITALS — BP 141/55 | HR 76 | Temp 98.0°F | Resp 18

## 2023-03-06 DIAGNOSIS — D469 Myelodysplastic syndrome, unspecified: Secondary | ICD-10-CM

## 2023-03-06 DIAGNOSIS — D461 Refractory anemia with ring sideroblasts: Secondary | ICD-10-CM | POA: Diagnosis not present

## 2023-03-06 LAB — CBC WITH DIFFERENTIAL (CANCER CENTER ONLY)
Abs Immature Granulocytes: 0.55 10*3/uL — ABNORMAL HIGH (ref 0.00–0.07)
Basophils Absolute: 0.3 10*3/uL — ABNORMAL HIGH (ref 0.0–0.1)
Basophils Relative: 2 %
Eosinophils Absolute: 2.2 10*3/uL — ABNORMAL HIGH (ref 0.0–0.5)
Eosinophils Relative: 12 %
HCT: 32.8 % — ABNORMAL LOW (ref 39.0–52.0)
Hemoglobin: 10.1 g/dL — ABNORMAL LOW (ref 13.0–17.0)
Immature Granulocytes: 3 %
Lymphocytes Relative: 13 %
Lymphs Abs: 2.4 10*3/uL (ref 0.7–4.0)
MCH: 25.8 pg — ABNORMAL LOW (ref 26.0–34.0)
MCHC: 30.8 g/dL (ref 30.0–36.0)
MCV: 83.7 fL (ref 80.0–100.0)
Monocytes Absolute: 0.8 10*3/uL (ref 0.1–1.0)
Monocytes Relative: 4 %
Neutro Abs: 12.4 10*3/uL — ABNORMAL HIGH (ref 1.7–7.7)
Neutrophils Relative %: 66 %
Platelet Count: 252 10*3/uL (ref 150–400)
RBC: 3.92 MIL/uL — ABNORMAL LOW (ref 4.22–5.81)
RDW: 35.9 % — ABNORMAL HIGH (ref 11.5–15.5)
WBC Count: 18.7 10*3/uL — ABNORMAL HIGH (ref 4.0–10.5)
nRBC: 0.2 % (ref 0.0–0.2)

## 2023-03-06 MED ORDER — EPOETIN ALFA-EPBX 40000 UNIT/ML IJ SOLN
40000.0000 [IU] | Freq: Once | INTRAMUSCULAR | Status: AC
  Administered 2023-03-06: 40000 [IU] via SUBCUTANEOUS
  Filled 2023-03-06: qty 1

## 2023-03-07 ENCOUNTER — Ambulatory Visit: Payer: PRIVATE HEALTH INSURANCE

## 2023-03-13 ENCOUNTER — Other Ambulatory Visit: Payer: Self-pay

## 2023-03-13 ENCOUNTER — Inpatient Hospital Stay: Payer: Medicare Other

## 2023-03-13 ENCOUNTER — Inpatient Hospital Stay: Payer: Medicare Other | Attending: Internal Medicine

## 2023-03-13 VITALS — BP 125/74 | HR 74 | Temp 97.8°F | Resp 16

## 2023-03-13 DIAGNOSIS — Z7961 Long term (current) use of immunomodulator: Secondary | ICD-10-CM | POA: Diagnosis not present

## 2023-03-13 DIAGNOSIS — Z9089 Acquired absence of other organs: Secondary | ICD-10-CM | POA: Insufficient documentation

## 2023-03-13 DIAGNOSIS — Z86018 Personal history of other benign neoplasm: Secondary | ICD-10-CM | POA: Insufficient documentation

## 2023-03-13 DIAGNOSIS — Z79899 Other long term (current) drug therapy: Secondary | ICD-10-CM | POA: Insufficient documentation

## 2023-03-13 DIAGNOSIS — D473 Essential (hemorrhagic) thrombocythemia: Secondary | ICD-10-CM

## 2023-03-13 DIAGNOSIS — Z9079 Acquired absence of other genital organ(s): Secondary | ICD-10-CM | POA: Diagnosis not present

## 2023-03-13 DIAGNOSIS — Z8546 Personal history of malignant neoplasm of prostate: Secondary | ICD-10-CM | POA: Diagnosis not present

## 2023-03-13 DIAGNOSIS — D461 Refractory anemia with ring sideroblasts: Secondary | ICD-10-CM | POA: Insufficient documentation

## 2023-03-13 DIAGNOSIS — D72829 Elevated white blood cell count, unspecified: Secondary | ICD-10-CM | POA: Diagnosis present

## 2023-03-13 DIAGNOSIS — K580 Irritable bowel syndrome with diarrhea: Secondary | ICD-10-CM | POA: Insufficient documentation

## 2023-03-13 DIAGNOSIS — Z860100 Personal history of colon polyps, unspecified: Secondary | ICD-10-CM | POA: Diagnosis not present

## 2023-03-13 DIAGNOSIS — D469 Myelodysplastic syndrome, unspecified: Secondary | ICD-10-CM

## 2023-03-13 DIAGNOSIS — D75839 Thrombocytosis, unspecified: Secondary | ICD-10-CM | POA: Insufficient documentation

## 2023-03-13 LAB — CMP (CANCER CENTER ONLY)
ALT: 30 U/L (ref 0–44)
AST: 17 U/L (ref 15–41)
Albumin: 4.4 g/dL (ref 3.5–5.0)
Alkaline Phosphatase: 78 U/L (ref 38–126)
Anion gap: 6 (ref 5–15)
BUN: 30 mg/dL — ABNORMAL HIGH (ref 8–23)
CO2: 23 mmol/L (ref 22–32)
Calcium: 9.3 mg/dL (ref 8.9–10.3)
Chloride: 109 mmol/L (ref 98–111)
Creatinine: 1.31 mg/dL — ABNORMAL HIGH (ref 0.61–1.24)
GFR, Estimated: 56 mL/min — ABNORMAL LOW (ref >60)
Glucose, Bld: 98 mg/dL (ref 70–99)
Potassium: 4.6 mmol/L (ref 3.5–5.1)
Sodium: 138 mmol/L (ref 135–145)
Total Bilirubin: 0.6 mg/dL (ref <1.2)
Total Protein: 7.4 g/dL (ref 6.5–8.1)

## 2023-03-13 LAB — CBC WITH DIFFERENTIAL/PLATELET
Abs Immature Granulocytes: 0.24 10*3/uL — ABNORMAL HIGH (ref 0.00–0.07)
Basophils Absolute: 0.3 10*3/uL — ABNORMAL HIGH (ref 0.0–0.1)
Basophils Relative: 2 %
Eosinophils Absolute: 2.1 10*3/uL — ABNORMAL HIGH (ref 0.0–0.5)
Eosinophils Relative: 12 %
HCT: 30.3 % — ABNORMAL LOW (ref 39.0–52.0)
Hemoglobin: 9.4 g/dL — ABNORMAL LOW (ref 13.0–17.0)
Immature Granulocytes: 1 %
Lymphocytes Relative: 13 %
Lymphs Abs: 2.4 10*3/uL (ref 0.7–4.0)
MCH: 25.8 pg — ABNORMAL LOW (ref 26.0–34.0)
MCHC: 31 g/dL (ref 30.0–36.0)
MCV: 83.2 fL (ref 80.0–100.0)
Monocytes Absolute: 1.1 10*3/uL — ABNORMAL HIGH (ref 0.1–1.0)
Monocytes Relative: 6 %
Neutro Abs: 12.2 10*3/uL — ABNORMAL HIGH (ref 1.7–7.7)
Neutrophils Relative %: 66 %
Platelets: 273 10*3/uL (ref 150–400)
RBC: 3.64 MIL/uL — ABNORMAL LOW (ref 4.22–5.81)
RDW: 35 % — ABNORMAL HIGH (ref 11.5–15.5)
WBC: 18.4 10*3/uL — ABNORMAL HIGH (ref 4.0–10.5)
nRBC: 0.2 % (ref 0.0–0.2)

## 2023-03-13 MED ORDER — EPOETIN ALFA-EPBX 40000 UNIT/ML IJ SOLN
40000.0000 [IU] | Freq: Once | INTRAMUSCULAR | Status: AC
  Administered 2023-03-13: 40000 [IU] via SUBCUTANEOUS
  Filled 2023-03-13: qty 1

## 2023-03-14 ENCOUNTER — Ambulatory Visit: Payer: PRIVATE HEALTH INSURANCE

## 2023-03-20 ENCOUNTER — Inpatient Hospital Stay: Payer: Medicare Other

## 2023-03-20 VITALS — BP 129/67 | HR 77 | Temp 97.6°F | Resp 18

## 2023-03-20 DIAGNOSIS — D469 Myelodysplastic syndrome, unspecified: Secondary | ICD-10-CM

## 2023-03-20 DIAGNOSIS — D461 Refractory anemia with ring sideroblasts: Secondary | ICD-10-CM | POA: Diagnosis not present

## 2023-03-20 LAB — CBC WITH DIFFERENTIAL (CANCER CENTER ONLY)
Abs Immature Granulocytes: 0.15 10*3/uL — ABNORMAL HIGH (ref 0.00–0.07)
Basophils Absolute: 0.4 10*3/uL — ABNORMAL HIGH (ref 0.0–0.1)
Basophils Relative: 3 %
Eosinophils Absolute: 0.7 10*3/uL — ABNORMAL HIGH (ref 0.0–0.5)
Eosinophils Relative: 5 %
HCT: 29.8 % — ABNORMAL LOW (ref 39.0–52.0)
Hemoglobin: 9.2 g/dL — ABNORMAL LOW (ref 13.0–17.0)
Immature Granulocytes: 1 %
Lymphocytes Relative: 16 %
Lymphs Abs: 2.1 10*3/uL (ref 0.7–4.0)
MCH: 25.8 pg — ABNORMAL LOW (ref 26.0–34.0)
MCHC: 30.9 g/dL (ref 30.0–36.0)
MCV: 83.7 fL (ref 80.0–100.0)
Monocytes Absolute: 0.8 10*3/uL (ref 0.1–1.0)
Monocytes Relative: 6 %
Neutro Abs: 8.9 10*3/uL — ABNORMAL HIGH (ref 1.7–7.7)
Neutrophils Relative %: 69 %
Platelet Count: 281 10*3/uL (ref 150–400)
RBC: 3.56 MIL/uL — ABNORMAL LOW (ref 4.22–5.81)
RDW: 34.9 % — ABNORMAL HIGH (ref 11.5–15.5)
WBC Count: 13 10*3/uL — ABNORMAL HIGH (ref 4.0–10.5)
nRBC: 0.2 % (ref 0.0–0.2)

## 2023-03-20 MED ORDER — EPOETIN ALFA-EPBX 40000 UNIT/ML IJ SOLN
40000.0000 [IU] | Freq: Once | INTRAMUSCULAR | Status: AC
Start: 2023-03-20 — End: 2023-03-20
  Administered 2023-03-20: 40000 [IU] via SUBCUTANEOUS
  Filled 2023-03-20: qty 1

## 2023-03-20 NOTE — Progress Notes (Signed)
Per Shanda Bumps in the lab, hgb 9.2.  Can proceed with injection today

## 2023-03-21 ENCOUNTER — Ambulatory Visit: Payer: PRIVATE HEALTH INSURANCE

## 2023-03-27 ENCOUNTER — Other Ambulatory Visit: Payer: Self-pay

## 2023-03-27 ENCOUNTER — Inpatient Hospital Stay: Payer: Medicare Other

## 2023-03-27 ENCOUNTER — Encounter: Payer: Self-pay | Admitting: Medical Oncology

## 2023-03-27 ENCOUNTER — Inpatient Hospital Stay (HOSPITAL_BASED_OUTPATIENT_CLINIC_OR_DEPARTMENT_OTHER): Payer: Medicare Other | Admitting: Internal Medicine

## 2023-03-27 VITALS — BP 144/73 | HR 70 | Temp 97.6°F | Resp 17 | Ht 69.0 in | Wt 154.3 lb

## 2023-03-27 DIAGNOSIS — D469 Myelodysplastic syndrome, unspecified: Secondary | ICD-10-CM

## 2023-03-27 DIAGNOSIS — D461 Refractory anemia with ring sideroblasts: Secondary | ICD-10-CM | POA: Diagnosis not present

## 2023-03-27 LAB — CBC WITH DIFFERENTIAL (CANCER CENTER ONLY)
Abs Immature Granulocytes: 1.33 10*3/uL — ABNORMAL HIGH (ref 0.00–0.07)
Basophils Absolute: 0.4 10*3/uL — ABNORMAL HIGH (ref 0.0–0.1)
Basophils Relative: 2 %
Eosinophils Absolute: 0.8 10*3/uL — ABNORMAL HIGH (ref 0.0–0.5)
Eosinophils Relative: 5 %
HCT: 28.8 % — ABNORMAL LOW (ref 39.0–52.0)
Hemoglobin: 8.8 g/dL — ABNORMAL LOW (ref 13.0–17.0)
Immature Granulocytes: 8 %
Lymphocytes Relative: 19 %
Lymphs Abs: 3.2 10*3/uL (ref 0.7–4.0)
MCH: 25.6 pg — ABNORMAL LOW (ref 26.0–34.0)
MCHC: 30.6 g/dL (ref 30.0–36.0)
MCV: 83.7 fL (ref 80.0–100.0)
Monocytes Absolute: 0.7 10*3/uL (ref 0.1–1.0)
Monocytes Relative: 4 %
Neutro Abs: 10.4 10*3/uL — ABNORMAL HIGH (ref 1.7–7.7)
Neutrophils Relative %: 62 %
Platelet Count: 336 10*3/uL (ref 150–400)
RBC: 3.44 MIL/uL — ABNORMAL LOW (ref 4.22–5.81)
RDW: 37.4 % — ABNORMAL HIGH (ref 11.5–15.5)
WBC Count: 16.7 10*3/uL — ABNORMAL HIGH (ref 4.0–10.5)
nRBC: 0.5 % — ABNORMAL HIGH (ref 0.0–0.2)

## 2023-03-27 LAB — CMP (CANCER CENTER ONLY)
ALT: 12 U/L (ref 0–44)
AST: 11 U/L — ABNORMAL LOW (ref 15–41)
Albumin: 4.3 g/dL (ref 3.5–5.0)
Alkaline Phosphatase: 50 U/L (ref 38–126)
Anion gap: 3 — ABNORMAL LOW (ref 5–15)
BUN: 22 mg/dL (ref 8–23)
CO2: 26 mmol/L (ref 22–32)
Calcium: 9.2 mg/dL (ref 8.9–10.3)
Chloride: 112 mmol/L — ABNORMAL HIGH (ref 98–111)
Creatinine: 1.3 mg/dL — ABNORMAL HIGH (ref 0.61–1.24)
GFR, Estimated: 57 mL/min — ABNORMAL LOW (ref >60)
Glucose, Bld: 94 mg/dL (ref 70–99)
Potassium: 4.7 mmol/L (ref 3.5–5.1)
Sodium: 141 mmol/L (ref 135–145)
Total Bilirubin: 0.8 mg/dL (ref <1.2)
Total Protein: 6.9 g/dL (ref 6.5–8.1)

## 2023-03-27 MED ORDER — EPOETIN ALFA-EPBX 40000 UNIT/ML IJ SOLN
40000.0000 [IU] | Freq: Once | INTRAMUSCULAR | Status: AC
  Administered 2023-03-27: 40000 [IU] via SUBCUTANEOUS
  Filled 2023-03-27: qty 1

## 2023-03-27 NOTE — Progress Notes (Signed)
HGB today = 8.8. Meets parameters for Epoetin today

## 2023-03-27 NOTE — Progress Notes (Signed)
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   PRIOR THERAPY: Previous treatment with combination of Hydrea and anagrelide.  CURRENT THERAPY:  1) Revlimid 5 mg by mouth daily 2 weeks on and 2 weeks off.  Started 03/25/2016.  Managed by Dr. Anise Johnson at [facility]. 2) anagrelide 0.5 mg p.o. twice daily. 3) Aranesp 300 mcg subcutaneously every 3 weeks.  This is switched to Procrit 400 mcg subcutaneously every 2 weeks starting April 13, 2017.  The frequency of the Procrit was changed to weekly schedule when he was in [place].  He is currently on Retacrit weekly as a biosimilar to Procrit.  INTERVAL HISTORY: Roy Johnson 77 y.o. male returns to the clinic today for follow-up visit. Discussed the use of AI scribe software for clinical note transcription with the patient, who gave verbal consent to proceed.  History of Present Illness   Roy Johnson, a 77 year old patient with a history of myelodysplastic syndrome, essential thrombocytopenia, and leukocytosis, has been on several treatments, including Revlimid and Retacrit. Recently, he started a new medication, Momelotinib, prescribed by Dr. Anise Johnson. After a week on the new medication, the patient reports a 'fuzzier' head and increased difficulty reading. He attributes these symptoms to the new medication and the Revlimid still working its way out of his system.  The patient also reports a decline in cognitive ability, particularly language skills, which he believes has been exacerbated by the new medication. He has stopped taking Revlimid completely and switched to Momelotinib, with the hope that it may reduce his need for anagrolide and  be better for his spleen.  The patient is aware of the potential side effects of Momelotinib, including low platelets, diarrhea, dizziness, fatigue, and headaches. He reports that fatigue is a common symptom for him, regardless of the medication he is taking. He also mentions that he has been living with diarrhea for a long time due to a history of colitis and a recent bout of C. diff infection.  The patient is due to move to [place] soon and is ensuring that his healthcare oncologist provider, Dr. Nile Johnson is familiar with his treatment changes. He is also considering a switch to luspatercept for his red blood cells in the spring, pending the effects of the Momelotinib.  In addition to his medical conditions, the patient is dealing with the fatigue and lack of concentration, which he finds frustrating. He also mentions that his wife has heart control and recently had a fall, adding to his concerns.       MEDICAL HISTORY: Past Medical History:  Diagnosis Date   Anemia    Anesthesia of skin    Calculus of gallbladder with acute cholecystitis, with obstruction    Cellulitis of back    Chest pain    Chronic kidney disease (CKD), stage III (moderate) (HCC)    Colon polyp    Cough    Disorientation    Diverticulitis    Diverticulosis    Dyspnea on exertion    Elevated white blood cell count    Fatigue    Forgetfulness    GERD (gastroesophageal reflux disease)    Hearing loss    HTN (hypertension)    Hydrocele    Hyperkalemia    Hyperlipidemia    Hypertensive chronic  kidney disease with stage 1 through stage 4 chronic kidney disease, or unspecified chronic kidney disease    Hypokalemia    IBS (irritable bowel syndrome)    Impacted cerumen, bilateral    Inguinal hernia    Insomnia    Lightheadedness    Liver disease    Lower abdominal pain    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   Mixed stress and urge urinary incontinence    Myelodysplastic syndrome (HCC)    Nausea with vomiting     Numbness and tingling in both hands    Other amnesia    Other benign neoplasm of skin, unspecified    Overflow incontinence of urine    Pain in right leg    Paresthesia of skin    Prostate cancer (HCC)    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)    Right upper quadrant pain    Sinusitis, acute    SOB (shortness of breath)    Thrombocythemia    Umbilical hernia    Vitamin D deficiency    Weakness     ALLERGIES:  has no known allergies.  MEDICATIONS:  Current Outpatient Medications  Medication Sig Dispense Refill   amLODipine (NORVASC) 5 MG tablet Take 5 mg by mouth daily.  2   anagrelide (AGRYLIN) 1 MG capsule TAKE 1 CAPSULE(1 MG) BY MOUTH DAILY 90 capsule 0   cetirizine (ZYRTEC) 10 MG tablet TAKE 1 TABLET(10 MG) BY MOUTH DAILY     cholestyramine (QUESTRAN) 4 g packet MIX AND DRINK 1 PACKET(4 GRAMS) BY MOUTH DAILY 30 each 2   colestipol (COLESTID) 1 g tablet TAKE 2 TABLETS(2 GRAMS) BY MOUTH TWICE DAILY 360 tablet 3   diphenoxylate-atropine (LOMOTIL) 2.5-0.025 MG tablet Take 1 tablet by mouth 4 (four) times daily as needed. (Patient not taking: Reported on 11/22/2022)     epoetin alfa-epbx (RETACRIT) 24401 UNIT/ML injection Inject into the skin.     gabapentin (NEURONTIN) 100 MG capsule TAKE 1 CAPSULE BY MOUTH TWICE DAILY AND 2 AT NIGHT 360 capsule 1   hydrocortisone (ANUSOL-HC) 2.5 % rectal cream Place 1 Application rectally 3 (three) times daily as needed for hemorrhoids or anal itching. (Patient not taking: Reported on 11/22/2022) 30 g 0   hyoscyamine (LEVSIN SL) 0.125 MG SL tablet DISSOLVE 1 TABLET(0.125 MG) UNDER THE TONGUE EVERY 4 HOURS AS NEEDED (Patient not taking: Reported on 11/22/2022) 90 tablet 3   lenalidomide (REVLIMID) 5 MG capsule Take 5 mg by mouth daily.     Multiple Vitamin (MULTI-VITAMINS) TABS Take 1 tablet by mouth daily.     Probiotic Product (ALIGN) 4 MG CAPS Take 1 capsule by mouth daily.     No current facility-administered medications for this  visit.   Facility-Administered Medications Ordered in Other Visits  Medication Dose Route Frequency Provider Last Rate Last Admin   0.9 %  sodium chloride infusion  250 mL Intravenous Once Si Gaul, Roy Johnson        SURGICAL HISTORY:  Past Surgical History:  Procedure Laterality Date   COLONOSCOPY     POLYPECTOMY     PROSTATECTOMY     TONSILLECTOMY      REVIEW OF SYSTEMS:  Constitutional: positive for fatigue Eyes: negative Ears, nose, mouth, throat, and face: negative Respiratory: positive for cough Cardiovascular: negative Gastrointestinal: positive for change in bowel habits Genitourinary:negative Integument/breast: negative Hematologic/lymphatic: negative Musculoskeletal:negative Neurological: positive for dizziness and lack of concentration Behavioral/Psych: negative Endocrine: negative Allergic/Immunologic: negative   PHYSICAL EXAMINATION: General appearance: alert, cooperative, appears stated age,  fatigued, and no distress Head: Normocephalic, without obvious abnormality, atraumatic Neck: no adenopathy, no JVD, supple, symmetrical, trachea midline, and thyroid not enlarged, symmetric, no tenderness/mass/nodules Lymph nodes: Cervical, supraclavicular, and axillary nodes normal. Resp: clear to auscultation bilaterally Back: symmetric, no curvature. ROM normal. No CVA tenderness. Cardio: regular rate and rhythm, S1, S2 normal, no murmur, click, rub or gallop GI: soft, non-tender; bowel sounds normal; no masses,  no organomegaly Extremities: extremities normal, atraumatic, no cyanosis or edema Neurologic: Alert and oriented X 3, normal strength and tone. Normal symmetric reflexes. Normal coordination and gait  ECOG PERFORMANCE STATUS: 1 - Symptomatic but completely ambulatory  Blood pressure (!) 144/73, pulse 70, temperature 97.6 F (36.4 C), temperature source Temporal, resp. rate 17, height 5\' 9"  (1.753 m), weight 154 lb 4.8 oz (70 kg), SpO2 100%.  LABORATORY  DATA:  Lab Results  Component Value Date   WBC 16.7 (H) 03/27/2023   HGB 8.8 (L) 03/27/2023   HCT 28.8 (L) 03/27/2023   MCV 83.7 03/27/2023   PLT 336 03/27/2023      Chemistry      Component Value Date/Time   NA 141 03/27/2023 1000   NA 141 12/08/2016 0928   K 4.7 03/27/2023 1000   K 4.5 12/08/2016 0928   CL 112 (H) 03/27/2023 1000   CO2 26 03/27/2023 1000   CO2 27 12/08/2016 0928   BUN 22 03/27/2023 1000   BUN 14.1 12/08/2016 0928   CREATININE 1.30 (H) 03/27/2023 1000   CREATININE 0.9 12/08/2016 0928      Component Value Date/Time   CALCIUM 9.2 03/27/2023 1000   CALCIUM 9.0 12/08/2016 0928   ALKPHOS 50 03/27/2023 1000   ALKPHOS 87 12/08/2016 0928   AST 11 (L) 03/27/2023 1000   AST 25 12/08/2016 0928   ALT 12 03/27/2023 1000   ALT 41 12/08/2016 0928   BILITOT 0.8 03/27/2023 1000   BILITOT 0.84 12/08/2016 0928      RADIOGRAPHIC STUDIES:  ASSESSMENT AND PLAN:  This is a very pleasant 77 years old white male with essential thrombocythemia with positive JAK-2 mutation as well as myelodysplastic syndrome. He is currently on treatment with Revlimid 5 mg by mouth daily according to the recommendation from [facility].  He is also on treatment with Aranesp every 3 weeks.  Aranesp was a change it to Procrit initially every 2 weeks and currently on weekly basis.  He mentioned that he is feeling much better on Procrit weekly  The patient has been on treatment with Retacrit 40,000 unit weekly for more than 3 years.  He has been tolerating this treatment well. I recommended for the patient to continue with the weekly Procrit as planned.     Myelofibrosis Started on Momelotinib 200 mg once daily. Reports cognitive difficulties and trouble reading since initiation. Goal: reduce anagrelide need and spleen size. Discussed risks: thrombocytopenia, diarrhea, hemorrhage, dizziness, fatigue, nausea, cough, hypotension, fever. Benefits: potential spleen size reduction, decreased  anagrelide need. Prefers limited reading on medication to avoid self-treatment. - Continue Momelotinib 200 mg once daily - Monitor platelet counts weekly - Adjust anagrelide dosage based on platelet counts - Monitor for side effects including cognitive changes, diarrhea, and fatigue  Essential Thrombocythemia Currently on anagrelide. Goal: manage thrombocytosis and reduce anagrelide need with new regimen. Discussed risks of thrombocytopenia with Momelotinib. - Continue anagrelide with potential dose adjustments based on platelet counts - Monitor platelet counts weekly  Myelodysplastic Syndrome (MDS) Previously on Revlimid, now discontinued. Recent bone marrow biopsy shows  high myeloproliferative disorder. Current treatment with Momelotinib targets myeloproliferative neoplasm. Potential switch to Luspatercept in spring if effective. - Continue monitoring blood counts - Consider switching to Luspatercept in spring if current treatment is effective  Leukocytosis Current treatment with Momelotinib may impact leukocyte counts. - Monitor white blood cell counts regularly  Irritable Bowel Syndrome (IBS) Chronic diarrhea with history of colitis and C. diff infection. Symptoms may be exacerbated by current medications. - Monitor bowel symptoms - Consider gastroenterology referral if symptoms worsen  General Health Maintenance Planning to move to [place]. Ensuring continuity of care and necessary blood draws and injections before the move. - Ensure blood draw and injection scheduled through January 13 - Send medical records to new oncologist in [place]  Follow-up - Schedule follow-up appointment for May 20 - Adjust follow-up if patient returns earlier.   The patient was advised to call immediately if he has any concerning symptoms in the interval. All questions were answered. The patient knows to call the clinic with any problems, questions or concerns. We can certainly see the patient much  sooner if necessary.   Disclaimer: This note was dictated with voice recognition software. Similar sounding words can inadvertently be transcribed and may not be corrected upon review.

## 2023-03-28 ENCOUNTER — Ambulatory Visit: Payer: PRIVATE HEALTH INSURANCE | Admitting: Internal Medicine

## 2023-03-28 ENCOUNTER — Other Ambulatory Visit: Payer: Self-pay

## 2023-03-28 ENCOUNTER — Other Ambulatory Visit: Payer: PRIVATE HEALTH INSURANCE

## 2023-03-28 ENCOUNTER — Ambulatory Visit: Payer: PRIVATE HEALTH INSURANCE

## 2023-03-30 ENCOUNTER — Other Ambulatory Visit: Payer: Self-pay

## 2023-03-31 ENCOUNTER — Other Ambulatory Visit: Payer: Self-pay

## 2023-03-31 DIAGNOSIS — D469 Myelodysplastic syndrome, unspecified: Secondary | ICD-10-CM

## 2023-04-03 ENCOUNTER — Inpatient Hospital Stay: Payer: Medicare Other

## 2023-04-03 VITALS — BP 147/67 | HR 71 | Temp 97.6°F | Resp 19

## 2023-04-03 DIAGNOSIS — D469 Myelodysplastic syndrome, unspecified: Secondary | ICD-10-CM

## 2023-04-03 DIAGNOSIS — D461 Refractory anemia with ring sideroblasts: Secondary | ICD-10-CM | POA: Diagnosis not present

## 2023-04-03 LAB — CMP (CANCER CENTER ONLY)
ALT: 10 U/L (ref 0–44)
AST: 11 U/L — ABNORMAL LOW (ref 15–41)
Albumin: 4.5 g/dL (ref 3.5–5.0)
Alkaline Phosphatase: 53 U/L (ref 38–126)
Anion gap: 6 (ref 5–15)
BUN: 26 mg/dL — ABNORMAL HIGH (ref 8–23)
CO2: 26 mmol/L (ref 22–32)
Calcium: 9.6 mg/dL (ref 8.9–10.3)
Chloride: 109 mmol/L (ref 98–111)
Creatinine: 1.46 mg/dL — ABNORMAL HIGH (ref 0.61–1.24)
GFR, Estimated: 49 mL/min — ABNORMAL LOW (ref >60)
Glucose, Bld: 121 mg/dL — ABNORMAL HIGH (ref 70–99)
Potassium: 4.6 mmol/L (ref 3.5–5.1)
Sodium: 141 mmol/L (ref 135–145)
Total Bilirubin: 0.7 mg/dL (ref <1.2)
Total Protein: 7 g/dL (ref 6.5–8.1)

## 2023-04-03 LAB — CBC WITH DIFFERENTIAL (CANCER CENTER ONLY)
Abs Immature Granulocytes: 0.6 10*3/uL — ABNORMAL HIGH (ref 0.00–0.07)
Basophils Absolute: 0.3 10*3/uL — ABNORMAL HIGH (ref 0.0–0.1)
Basophils Relative: 3 %
Eosinophils Absolute: 0.5 10*3/uL (ref 0.0–0.5)
Eosinophils Relative: 4 %
HCT: 29.4 % — ABNORMAL LOW (ref 39.0–52.0)
Hemoglobin: 9 g/dL — ABNORMAL LOW (ref 13.0–17.0)
Immature Granulocytes: 5 %
Lymphocytes Relative: 19 %
Lymphs Abs: 2.4 10*3/uL (ref 0.7–4.0)
MCH: 25.9 pg — ABNORMAL LOW (ref 26.0–34.0)
MCHC: 30.6 g/dL (ref 30.0–36.0)
MCV: 84.7 fL (ref 80.0–100.0)
Monocytes Absolute: 1 10*3/uL (ref 0.1–1.0)
Monocytes Relative: 8 %
Neutro Abs: 7.5 10*3/uL (ref 1.7–7.7)
Neutrophils Relative %: 61 %
Platelet Count: 294 10*3/uL (ref 150–400)
RBC: 3.47 MIL/uL — ABNORMAL LOW (ref 4.22–5.81)
RDW: 34.6 % — ABNORMAL HIGH (ref 11.5–15.5)
WBC Count: 12.2 10*3/uL — ABNORMAL HIGH (ref 4.0–10.5)
nRBC: 0.3 % — ABNORMAL HIGH (ref 0.0–0.2)

## 2023-04-03 MED ORDER — EPOETIN ALFA-EPBX 40000 UNIT/ML IJ SOLN
40000.0000 [IU] | Freq: Once | INTRAMUSCULAR | Status: AC
  Administered 2023-04-03: 40000 [IU] via SUBCUTANEOUS
  Filled 2023-04-03: qty 1

## 2023-04-03 NOTE — Progress Notes (Signed)
Per Jess in the lab- hgb 9.0.

## 2023-04-07 ENCOUNTER — Encounter: Payer: Self-pay | Admitting: Medical Oncology

## 2023-04-07 ENCOUNTER — Other Ambulatory Visit: Payer: Self-pay | Admitting: Medical Oncology

## 2023-04-07 DIAGNOSIS — D469 Myelodysplastic syndrome, unspecified: Secondary | ICD-10-CM

## 2023-04-07 NOTE — Progress Notes (Signed)
Cbc/diff order entered.

## 2023-04-07 NOTE — Progress Notes (Signed)
04/16/2022 injection for Epoetin Alfa-epbx-  -Per Dr. Arbutus Ped it is ok to get CBC/diff  on jan 3 and use the results for his injection on jan 6th.

## 2023-04-10 ENCOUNTER — Inpatient Hospital Stay: Payer: Medicare Other

## 2023-04-10 ENCOUNTER — Encounter: Payer: Self-pay | Admitting: Medical Oncology

## 2023-04-10 VITALS — BP 142/68 | HR 75 | Temp 97.7°F

## 2023-04-10 DIAGNOSIS — D469 Myelodysplastic syndrome, unspecified: Secondary | ICD-10-CM

## 2023-04-10 DIAGNOSIS — D461 Refractory anemia with ring sideroblasts: Secondary | ICD-10-CM | POA: Diagnosis not present

## 2023-04-10 LAB — IRON AND IRON BINDING CAPACITY (CC-WL,HP ONLY)
Iron: 97 ug/dL (ref 45–182)
Saturation Ratios: 32 % (ref 17.9–39.5)
TIBC: 304 ug/dL (ref 250–450)
UIBC: 207 ug/dL (ref 117–376)

## 2023-04-10 LAB — CBC WITH DIFFERENTIAL (CANCER CENTER ONLY)
Abs Immature Granulocytes: 0.69 10*3/uL — ABNORMAL HIGH (ref 0.00–0.07)
Basophils Absolute: 0.3 10*3/uL — ABNORMAL HIGH (ref 0.0–0.1)
Basophils Relative: 2 %
Eosinophils Absolute: 1.2 10*3/uL — ABNORMAL HIGH (ref 0.0–0.5)
Eosinophils Relative: 7 %
HCT: 32.6 % — ABNORMAL LOW (ref 39.0–52.0)
Hemoglobin: 10.2 g/dL — ABNORMAL LOW (ref 13.0–17.0)
Immature Granulocytes: 4 %
Lymphocytes Relative: 21 %
Lymphs Abs: 3.5 10*3/uL (ref 0.7–4.0)
MCH: 26.2 pg (ref 26.0–34.0)
MCHC: 31.3 g/dL (ref 30.0–36.0)
MCV: 83.6 fL (ref 80.0–100.0)
Monocytes Absolute: 0.7 10*3/uL (ref 0.1–1.0)
Monocytes Relative: 4 %
Neutro Abs: 10.3 10*3/uL — ABNORMAL HIGH (ref 1.7–7.7)
Neutrophils Relative %: 62 %
Platelet Count: 315 10*3/uL (ref 150–400)
RBC: 3.9 MIL/uL — ABNORMAL LOW (ref 4.22–5.81)
RDW: 34 % — ABNORMAL HIGH (ref 11.5–15.5)
WBC Count: 16.7 10*3/uL — ABNORMAL HIGH (ref 4.0–10.5)
nRBC: 0.2 % (ref 0.0–0.2)

## 2023-04-10 LAB — CMP (CANCER CENTER ONLY)
ALT: 10 U/L (ref 0–44)
AST: 13 U/L — ABNORMAL LOW (ref 15–41)
Albumin: 4.7 g/dL (ref 3.5–5.0)
Alkaline Phosphatase: 52 U/L (ref 38–126)
Anion gap: 5 (ref 5–15)
BUN: 20 mg/dL (ref 8–23)
CO2: 27 mmol/L (ref 22–32)
Calcium: 9.6 mg/dL (ref 8.9–10.3)
Chloride: 109 mmol/L (ref 98–111)
Creatinine: 1.28 mg/dL — ABNORMAL HIGH (ref 0.61–1.24)
GFR, Estimated: 58 mL/min — ABNORMAL LOW (ref >60)
Glucose, Bld: 94 mg/dL (ref 70–99)
Potassium: 4.5 mmol/L (ref 3.5–5.1)
Sodium: 141 mmol/L (ref 135–145)
Total Bilirubin: 0.9 mg/dL (ref <1.2)
Total Protein: 7.7 g/dL (ref 6.5–8.1)

## 2023-04-10 LAB — LACTATE DEHYDROGENASE: LDH: 366 U/L — ABNORMAL HIGH (ref 98–192)

## 2023-04-10 LAB — FERRITIN: Ferritin: 483 ng/mL — ABNORMAL HIGH (ref 24–336)

## 2023-04-10 MED ORDER — EPOETIN ALFA-EPBX 40000 UNIT/ML IJ SOLN
40000.0000 [IU] | Freq: Once | INTRAMUSCULAR | Status: AC
Start: 2023-04-10 — End: 2023-04-10
  Administered 2023-04-10: 40000 [IU] via SUBCUTANEOUS
  Filled 2023-04-10: qty 1

## 2023-04-10 NOTE — Progress Notes (Signed)
HGB 10.2. Ok for pt to receive injection epogen.

## 2023-04-11 ENCOUNTER — Other Ambulatory Visit: Payer: Self-pay | Admitting: Gastroenterology

## 2023-04-14 ENCOUNTER — Inpatient Hospital Stay: Payer: Medicare Other | Attending: Internal Medicine

## 2023-04-14 DIAGNOSIS — D469 Myelodysplastic syndrome, unspecified: Secondary | ICD-10-CM

## 2023-04-14 DIAGNOSIS — D461 Refractory anemia with ring sideroblasts: Secondary | ICD-10-CM | POA: Insufficient documentation

## 2023-04-14 DIAGNOSIS — D72829 Elevated white blood cell count, unspecified: Secondary | ICD-10-CM | POA: Insufficient documentation

## 2023-04-14 LAB — CBC WITH DIFFERENTIAL (CANCER CENTER ONLY)
Abs Immature Granulocytes: 1.77 10*3/uL — ABNORMAL HIGH (ref 0.00–0.07)
Basophils Absolute: 0.4 10*3/uL — ABNORMAL HIGH (ref 0.0–0.1)
Basophils Relative: 2 %
Eosinophils Absolute: 0.9 10*3/uL — ABNORMAL HIGH (ref 0.0–0.5)
Eosinophils Relative: 4 %
HCT: 32.4 % — ABNORMAL LOW (ref 39.0–52.0)
Hemoglobin: 10.2 g/dL — ABNORMAL LOW (ref 13.0–17.0)
Immature Granulocytes: 9 %
Lymphocytes Relative: 15 %
Lymphs Abs: 3.1 10*3/uL (ref 0.7–4.0)
MCH: 25.7 pg — ABNORMAL LOW (ref 26.0–34.0)
MCHC: 31.5 g/dL (ref 30.0–36.0)
MCV: 81.6 fL (ref 80.0–100.0)
Monocytes Absolute: 0.9 10*3/uL (ref 0.1–1.0)
Monocytes Relative: 4 %
Neutro Abs: 13.9 10*3/uL — ABNORMAL HIGH (ref 1.7–7.7)
Neutrophils Relative %: 66 %
Platelet Count: 377 10*3/uL (ref 150–400)
RBC: 3.97 MIL/uL — ABNORMAL LOW (ref 4.22–5.81)
RDW: 35.3 % — ABNORMAL HIGH (ref 11.5–15.5)
WBC Count: 20.9 10*3/uL — ABNORMAL HIGH (ref 4.0–10.5)
nRBC: 0.6 % — ABNORMAL HIGH (ref 0.0–0.2)

## 2023-04-14 LAB — FERRITIN: Ferritin: 454 ng/mL — ABNORMAL HIGH (ref 24–336)

## 2023-04-14 LAB — CMP (CANCER CENTER ONLY)
ALT: 11 U/L (ref 0–44)
AST: 12 U/L — ABNORMAL LOW (ref 15–41)
Albumin: 4.9 g/dL (ref 3.5–5.0)
Alkaline Phosphatase: 60 U/L (ref 38–126)
Anion gap: 6 (ref 5–15)
BUN: 31 mg/dL — ABNORMAL HIGH (ref 8–23)
CO2: 24 mmol/L (ref 22–32)
Calcium: 9.9 mg/dL (ref 8.9–10.3)
Chloride: 109 mmol/L (ref 98–111)
Creatinine: 1.42 mg/dL — ABNORMAL HIGH (ref 0.61–1.24)
GFR, Estimated: 51 mL/min — ABNORMAL LOW (ref >60)
Glucose, Bld: 94 mg/dL (ref 70–99)
Potassium: 4.2 mmol/L (ref 3.5–5.1)
Sodium: 139 mmol/L (ref 135–145)
Total Bilirubin: 1 mg/dL (ref 0.0–1.2)
Total Protein: 8.1 g/dL (ref 6.5–8.1)

## 2023-04-14 LAB — IRON AND IRON BINDING CAPACITY (CC-WL,HP ONLY)
Iron: 107 ug/dL (ref 45–182)
Saturation Ratios: 34 % (ref 17.9–39.5)
TIBC: 312 ug/dL (ref 250–450)
UIBC: 205 ug/dL (ref 117–376)

## 2023-04-14 LAB — LACTATE DEHYDROGENASE: LDH: 380 U/L — ABNORMAL HIGH (ref 98–192)

## 2023-04-14 NOTE — Progress Notes (Signed)
 Per Byrd Hesselbach in lab, patients hgb 10.2.

## 2023-04-17 ENCOUNTER — Other Ambulatory Visit: Payer: Self-pay

## 2023-04-17 ENCOUNTER — Inpatient Hospital Stay: Payer: Medicare Other

## 2023-04-17 VITALS — BP 147/73 | HR 84 | Temp 97.8°F | Resp 17

## 2023-04-17 DIAGNOSIS — D469 Myelodysplastic syndrome, unspecified: Secondary | ICD-10-CM

## 2023-04-17 DIAGNOSIS — D461 Refractory anemia with ring sideroblasts: Secondary | ICD-10-CM | POA: Diagnosis not present

## 2023-04-17 MED ORDER — EPOETIN ALFA-EPBX 40000 UNIT/ML IJ SOLN
40000.0000 [IU] | Freq: Once | INTRAMUSCULAR | Status: AC
  Administered 2023-04-17: 40000 [IU] via SUBCUTANEOUS
  Filled 2023-04-17: qty 1

## 2023-04-22 ENCOUNTER — Other Ambulatory Visit: Payer: Self-pay

## 2023-05-12 ENCOUNTER — Encounter: Payer: Self-pay | Admitting: Internal Medicine

## 2023-05-13 ENCOUNTER — Other Ambulatory Visit: Payer: Self-pay

## 2023-05-15 ENCOUNTER — Other Ambulatory Visit: Payer: Self-pay | Admitting: Gastroenterology

## 2023-06-15 ENCOUNTER — Other Ambulatory Visit: Payer: Self-pay | Admitting: Gastroenterology

## 2023-06-19 ENCOUNTER — Other Ambulatory Visit: Payer: Self-pay

## 2023-06-19 MED ORDER — CHOLESTYRAMINE 4 G PO PACK: 4.0000 g | PACK | Freq: Every day | ORAL | 0 refills | Status: DC

## 2023-06-19 NOTE — Telephone Encounter (Signed)
 I have MYCHARTED Roy Johnson that I set him up an appointment 08/29/2023 at 2:40pm with Dr Marina Goodell as it has been a long time since he was seen. I sent in his [employer] to cover him while in [facility] as requested.

## 2023-06-20 ENCOUNTER — Other Ambulatory Visit: Payer: Self-pay

## 2023-07-14 ENCOUNTER — Other Ambulatory Visit: Payer: Self-pay

## 2023-07-14 MED ORDER — CHOLESTYRAMINE 4 G PO PACK: 4.0000 g | PACK | Freq: Every day | ORAL | 1 refills | Status: DC

## 2023-07-14 NOTE — Telephone Encounter (Signed)
 I have sent in the requested Questran refill for [facility] . He is still in [place]. I will r/s his 08/29/2023 appointment to see Dr Marina Goodell and I will message him with the new date/time thru [facility].

## 2023-07-15 ENCOUNTER — Other Ambulatory Visit: Payer: Self-pay

## 2023-08-08 ENCOUNTER — Telehealth: Payer: Self-pay

## 2023-08-08 NOTE — Telephone Encounter (Signed)
 TC from patient stating that he is coming home from [facility] back to [place] and needs to make appts for May.  Information relayed to scheduler via secure chat.

## 2023-08-09 ENCOUNTER — Telehealth: Payer: Self-pay | Admitting: Internal Medicine

## 2023-08-09 NOTE — Telephone Encounter (Signed)
 Scheduled appointments with the patient. The patient is aware of the details and is active on MyChart.

## 2023-08-10 ENCOUNTER — Other Ambulatory Visit: Payer: Self-pay

## 2023-08-28 ENCOUNTER — Other Ambulatory Visit: Payer: Self-pay | Admitting: Medical Oncology

## 2023-08-28 DIAGNOSIS — D469 Myelodysplastic syndrome, unspecified: Secondary | ICD-10-CM

## 2023-08-29 ENCOUNTER — Ambulatory Visit: Admitting: Internal Medicine

## 2023-08-29 ENCOUNTER — Inpatient Hospital Stay: Attending: Internal Medicine

## 2023-08-29 ENCOUNTER — Inpatient Hospital Stay

## 2023-08-29 VITALS — BP 148/68 | HR 75 | Temp 97.6°F | Resp 18

## 2023-08-29 DIAGNOSIS — Z79899 Other long term (current) drug therapy: Secondary | ICD-10-CM | POA: Insufficient documentation

## 2023-08-29 DIAGNOSIS — D473 Essential (hemorrhagic) thrombocythemia: Secondary | ICD-10-CM | POA: Insufficient documentation

## 2023-08-29 DIAGNOSIS — Z860100 Personal history of colon polyps, unspecified: Secondary | ICD-10-CM | POA: Insufficient documentation

## 2023-08-29 DIAGNOSIS — R7989 Other specified abnormal findings of blood chemistry: Secondary | ICD-10-CM | POA: Diagnosis not present

## 2023-08-29 DIAGNOSIS — D469 Myelodysplastic syndrome, unspecified: Secondary | ICD-10-CM | POA: Diagnosis present

## 2023-08-29 DIAGNOSIS — J329 Chronic sinusitis, unspecified: Secondary | ICD-10-CM | POA: Insufficient documentation

## 2023-08-29 DIAGNOSIS — D75839 Thrombocytosis, unspecified: Secondary | ICD-10-CM | POA: Insufficient documentation

## 2023-08-29 DIAGNOSIS — R630 Anorexia: Secondary | ICD-10-CM | POA: Diagnosis not present

## 2023-08-29 DIAGNOSIS — Z7961 Long term (current) use of immunomodulator: Secondary | ICD-10-CM | POA: Insufficient documentation

## 2023-08-29 DIAGNOSIS — Z86018 Personal history of other benign neoplasm: Secondary | ICD-10-CM | POA: Insufficient documentation

## 2023-08-29 DIAGNOSIS — J40 Bronchitis, not specified as acute or chronic: Secondary | ICD-10-CM | POA: Insufficient documentation

## 2023-08-29 DIAGNOSIS — Z9079 Acquired absence of other genital organ(s): Secondary | ICD-10-CM | POA: Diagnosis not present

## 2023-08-29 DIAGNOSIS — Z9089 Acquired absence of other organs: Secondary | ICD-10-CM | POA: Diagnosis not present

## 2023-08-29 LAB — CBC WITH DIFFERENTIAL (CANCER CENTER ONLY)
Abs Immature Granulocytes: 0.26 10*3/uL — ABNORMAL HIGH (ref 0.00–0.07)
Basophils Absolute: 0.2 10*3/uL — ABNORMAL HIGH (ref 0.0–0.1)
Basophils Relative: 1 %
Eosinophils Absolute: 1.3 10*3/uL — ABNORMAL HIGH (ref 0.0–0.5)
Eosinophils Relative: 11 %
HCT: 26.9 % — ABNORMAL LOW (ref 39.0–52.0)
Hemoglobin: 8.3 g/dL — ABNORMAL LOW (ref 13.0–17.0)
Immature Granulocytes: 2 %
Lymphocytes Relative: 13 %
Lymphs Abs: 1.6 10*3/uL (ref 0.7–4.0)
MCH: 26.2 pg (ref 26.0–34.0)
MCHC: 30.9 g/dL (ref 30.0–36.0)
MCV: 84.9 fL (ref 80.0–100.0)
Monocytes Absolute: 0.5 10*3/uL (ref 0.1–1.0)
Monocytes Relative: 4 %
Neutro Abs: 8.7 10*3/uL — ABNORMAL HIGH (ref 1.7–7.7)
Neutrophils Relative %: 69 %
Platelet Count: 254 10*3/uL (ref 150–400)
RBC: 3.17 MIL/uL — ABNORMAL LOW (ref 4.22–5.81)
RDW: 35.4 % — ABNORMAL HIGH (ref 11.5–15.5)
WBC Count: 12.5 10*3/uL — ABNORMAL HIGH (ref 4.0–10.5)
nRBC: 0.3 % — ABNORMAL HIGH (ref 0.0–0.2)

## 2023-08-29 LAB — CMP (CANCER CENTER ONLY)
ALT: 14 U/L (ref 0–44)
AST: 12 U/L — ABNORMAL LOW (ref 15–41)
Albumin: 4.3 g/dL (ref 3.5–5.0)
Alkaline Phosphatase: 58 U/L (ref 38–126)
Anion gap: 4 — ABNORMAL LOW (ref 5–15)
BUN: 23 mg/dL (ref 8–23)
CO2: 26 mmol/L (ref 22–32)
Calcium: 9.2 mg/dL (ref 8.9–10.3)
Chloride: 108 mmol/L (ref 98–111)
Creatinine: 1.46 mg/dL — ABNORMAL HIGH (ref 0.61–1.24)
GFR, Estimated: 49 mL/min — ABNORMAL LOW (ref >60)
Glucose, Bld: 126 mg/dL — ABNORMAL HIGH (ref 70–99)
Potassium: 4.7 mmol/L (ref 3.5–5.1)
Sodium: 138 mmol/L (ref 135–145)
Total Bilirubin: 0.9 mg/dL (ref 0.0–1.2)
Total Protein: 7.4 g/dL (ref 6.5–8.1)

## 2023-08-29 LAB — LACTATE DEHYDROGENASE: LDH: 244 U/L — ABNORMAL HIGH (ref 98–192)

## 2023-08-29 MED ORDER — EPOETIN ALFA-EPBX 40000 UNIT/ML IJ SOLN
40000.0000 [IU] | Freq: Once | INTRAMUSCULAR | Status: AC
  Administered 2023-08-29: 40000 [IU] via SUBCUTANEOUS
  Filled 2023-08-29: qty 1

## 2023-08-30 ENCOUNTER — Inpatient Hospital Stay (HOSPITAL_BASED_OUTPATIENT_CLINIC_OR_DEPARTMENT_OTHER): Admitting: Internal Medicine

## 2023-08-30 ENCOUNTER — Ambulatory Visit: Admitting: Physician Assistant

## 2023-08-30 VITALS — BP 140/64 | HR 79 | Temp 98.0°F | Resp 17 | Ht 69.0 in | Wt 149.1 lb

## 2023-08-30 DIAGNOSIS — D469 Myelodysplastic syndrome, unspecified: Secondary | ICD-10-CM | POA: Diagnosis not present

## 2023-08-30 DIAGNOSIS — C61 Malignant neoplasm of prostate: Secondary | ICD-10-CM | POA: Diagnosis not present

## 2023-08-30 NOTE — Progress Notes (Signed)
 [facility]  Telephone:([phone] Fax:([phone]) [phone]  OFFICE PROGRESS NOTE  Roy Broad, MD [address] 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   PRIOR THERAPY: Previous treatment with combination of Hydrea  and anagrelide .  CURRENT THERAPY:  1) Revlimid 5 mg by mouth daily 2 weeks on and 2 weeks off.  Started 03/25/2016.  Managed by Dr. Arne Johnson at [facility]. 2) anagrelide  0.5 mg p.o. twice daily. 3) Aranesp  300 mcg subcutaneously every 3 weeks.  This is switched to Procrit  400 mcg subcutaneously every 2 weeks starting April 13, 2017.  The frequency of the Procrit  was changed to weekly schedule when he was in [place] .  He is currently on Retacrit  weekly as a biosimilar to Procrit .  INTERVAL HISTORY: Roy Johnson 78 y.o. male returns to the clinic today for follow-up visit.Discussed the use of AI scribe software for clinical note transcription with the patient, who gave verbal consent to proceed.  History of Present Illness   Roy Johnson is a 78 year old male with myelodysplastic syndrome and refractory anemia with ring sideroblasts and thrombocytosis who presents for evaluation and repeat blood work.  He has a history of myelodysplastic syndrome with refractory anemia with ring sideroblasts and thrombocytosis, diagnosed in August 2017. He has been on Revlimid 5 mg orally, two weeks on and two weeks off, since December 2017. He is also on anagrelide  0.5 mg twice a day and Retacrit  weekly for anemia. His hemoglobin was 8.3 yesterday and his creatinine was 1.46. He had a course of steroids about three weeks ago, which have previously increased his creatinine.  He has essential thrombocythemia with a positive JAK2 mutation and leukocytosis. He was previously on Ojjaara  (momelotinib) to increase his hemoglobin, which initially rose above 10, but he experienced significant side effects and discontinued it. His white blood cell count was in the mid to upper twenties while on Ojjaara but has recently decreased to around twelve.  He has experienced recent episodes of sinusitis and bronchitis over the past five to six weeks, requiring three courses of antibiotics and steroids. He notes a lack of appetite and weight loss, stating 'food has no interest to me' and 'I have no taste for food.' He also mentions difficulty swallowing at times.  He had an MRI of the abdomen while in [place] , which showed no significant findings, although he experienced a brief episode of gallbladder issues with white stools that resolved quickly.  He has a history of prostate cancer, for which he underwent prostatectomy over ten years ago. His PSA levels have been stable, but he notes a slight increase recently, prompting a request for a PSA test during his next blood work.  He has a history of C. difficile infection and is cautious with antibiotic use to prevent recurrence.       MEDICAL HISTORY: Past Medical History:  Diagnosis Date   Anemia    Anesthesia of skin    Calculus of gallbladder with acute cholecystitis, with obstruction    Cellulitis of back    Chest pain    Chronic kidney disease (CKD), stage III (moderate) (HCC)    Colon polyp    Cough    Disorientation    Diverticulitis    Diverticulosis    Dyspnea on exertion    Elevated white blood cell count    Fatigue  Forgetfulness    GERD (gastroesophageal reflux disease)    Hearing loss    HTN (hypertension)    Hydrocele    Hyperkalemia    Hyperlipidemia    Hypertensive chronic kidney disease with stage 1 through stage 4 chronic kidney disease, or unspecified chronic kidney disease    Hypokalemia    IBS (irritable bowel syndrome)    Impacted cerumen, bilateral    Inguinal hernia    Insomnia    Lightheadedness     Liver disease    Lower abdominal pain    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   Mixed stress and urge urinary incontinence    Myelodysplastic syndrome (HCC)    Nausea with vomiting    Numbness and tingling in both hands    Other amnesia    Other benign neoplasm of skin, unspecified    Overflow incontinence of urine    Pain in right leg    Paresthesia of skin    Prostate cancer (HCC)    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)    Right upper quadrant pain    Sinusitis, acute    SOB (shortness of breath)    Thrombocythemia    Umbilical hernia    Vitamin D deficiency    Weakness     ALLERGIES:  has no known allergies.  MEDICATIONS:  Current Outpatient Medications  Medication Sig Dispense Refill   amLODipine (NORVASC) 5 MG tablet Take 5 mg by mouth daily.  2   amoxicillin  (AMOXIL ) 500 MG tablet Take 500 mg by mouth 2 (two) times daily.     anagrelide  (AGRYLIN) 1 MG capsule TAKE 1 CAPSULE(1 MG) BY MOUTH DAILY 90 capsule 0   cetirizine (ZYRTEC) 10 MG tablet TAKE 1 TABLET(10 MG) BY MOUTH DAILY     cholestyramine  (QUESTRAN ) 4 g packet Take 1 packet (4 g total) by mouth daily. 90 each 1   diphenoxylate-atropine (LOMOTIL) 2.5-0.025 MG tablet Take 1 tablet by mouth 4 (four) times daily as needed. (Patient not taking: Reported on 03/27/2023)     epoetin  alfa-epbx (RETACRIT ) 40000 UNIT/ML injection Inject into the skin.     gabapentin  (NEURONTIN ) 100 MG capsule TAKE 1 CAPSULE BY MOUTH TWICE DAILY AND 2 AT NIGHT 360 capsule 1   hydrocortisone  (ANUSOL -HC) 2.5 % rectal cream Place 1 Application rectally 3 (three) times daily as needed for hemorrhoids or anal itching. (Patient not taking: Reported on 03/27/2023) 30 g 0   hyoscyamine  (LEVSIN SL) 0.125 MG SL tablet DISSOLVE 1 TABLET(0.125 MG) UNDER THE TONGUE EVERY 4 HOURS AS NEEDED 90 tablet 3   lenalidomide (REVLIMID) 5 MG capsule Take 5 mg by mouth daily. (Patient not taking: Reported on 03/27/2023)     momelotinib dihydrochloride  (OJJAARA) 200 MG tablet Take 200 mg by mouth daily.     Multiple Vitamin (MULTI-VITAMINS) TABS Take 1 tablet by mouth daily.     Probiotic Product (ALIGN) 4 MG CAPS Take 1 capsule by mouth daily.     No current facility-administered medications for this visit.   Facility-Administered Medications Ordered in Other Visits  Medication Dose Route Frequency Provider Last Rate Last Admin   0.9 %  sodium chloride  infusion  250 mL Intravenous Once Marlene Simas, MD        SURGICAL HISTORY:  Past Surgical History:  Procedure Laterality Date   COLONOSCOPY     POLYPECTOMY     PROSTATECTOMY     TONSILLECTOMY      REVIEW OF SYSTEMS:  Constitutional: positive for fatigue Eyes: negative  Ears, nose, mouth, throat, and face: negative Respiratory: positive for cough, dyspnea on exertion, and sputum Cardiovascular: negative Gastrointestinal: positive for abdominal pain Genitourinary:negative Integument/breast: negative Hematologic/lymphatic: negative Musculoskeletal:negative Neurological: positive for dizziness Behavioral/Psych: negative Endocrine: negative Allergic/Immunologic: negative   PHYSICAL EXAMINATION: General appearance: alert, cooperative, appears stated age, fatigued, and no distress Head: Normocephalic, without obvious abnormality, atraumatic Neck: no adenopathy, no JVD, supple, symmetrical, trachea midline, and thyroid not enlarged, symmetric, no tenderness/mass/nodules Lymph nodes: Cervical, supraclavicular, and axillary nodes normal. Resp: clear to auscultation bilaterally Back: symmetric, no curvature. ROM normal. No CVA tenderness. Cardio: regular rate and rhythm, S1, S2 normal, no murmur, click, rub or gallop GI: soft, non-tender; bowel sounds normal; no masses,  no organomegaly Extremities: extremities normal, atraumatic, no cyanosis or edema Neurologic: Alert and oriented X 3, normal strength and tone. Normal symmetric reflexes. Normal coordination and gait  ECOG  PERFORMANCE STATUS: 1 - Symptomatic but completely ambulatory  Blood pressure (!) 140/64, pulse 79, temperature 98 F (36.7 C), temperature source Temporal, resp. rate 17, height 5\' 9"  (1.753 m), weight 149 lb 1.6 oz (67.6 kg), SpO2 100%.  LABORATORY DATA:  Lab Results  Component Value Date   WBC 12.5 (H) 08/29/2023   HGB 8.3 (L) 08/29/2023   HCT 26.9 (L) 08/29/2023   MCV 84.9 08/29/2023   PLT 254 08/29/2023      Chemistry      Component Value Date/Time   NA 138 08/29/2023 0858   NA 141 12/08/2016 0928   K 4.7 08/29/2023 0858   K 4.5 12/08/2016 0928   CL 108 08/29/2023 0858   CO2 26 08/29/2023 0858   CO2 27 12/08/2016 0928   BUN 23 08/29/2023 0858   BUN 14.1 12/08/2016 0928   CREATININE 1.46 (H) 08/29/2023 0858   CREATININE 0.9 12/08/2016 0928      Component Value Date/Time   CALCIUM 9.2 08/29/2023 0858   CALCIUM 9.0 12/08/2016 0928   ALKPHOS 58 08/29/2023 0858   ALKPHOS 87 12/08/2016 0928   AST 12 (L) 08/29/2023 0858   AST 25 12/08/2016 0928   ALT 14 08/29/2023 0858   ALT 41 12/08/2016 0928   BILITOT 0.9 08/29/2023 0858   BILITOT 0.84 12/08/2016 0928      RADIOGRAPHIC STUDIES:  ASSESSMENT AND PLAN:  This is a very pleasant 78 years old white male with essential thrombocythemia with positive JAK-2 mutation as well as myelodysplastic syndrome. He is currently on treatment with Revlimid 5 mg by mouth daily according to the recommendation from [facility].  He is also on treatment with Aranesp  every 3 weeks.  Aranesp  was a change it to Procrit  initially every 2 weeks and currently on weekly basis.  He mentioned that he is feeling much better on Procrit  weekly  The patient has been on treatment with Retacrit  40,000 unit weekly for more than 3 years.  He has been tolerating this treatment well.     Myelodysplastic syndrome with refractory anemia and thrombocytosis Diagnosed in August 2017, with fluctuating hemoglobin levels, recently at 8.3 g/dL. Previous treatment  with Ojjaara (momelotinib) improved hemoglobin but caused elevated BUN and creatinine. Currently on Revlimid and Retacrit , with plans to continue this regimen. Potential future use of JAK inhibitors discussed, pending stabilization of current condition. - Continue Revlimid 5 mg PO two weeks on and two weeks off - Continue Retacrit  weekly - Monitor hemoglobin levels - Consider future JAK inhibitors if current regimen does not stabilize condition  Leukocytosis White blood cell count previously in the  mid to upper twenties, recently decreased to around twelve. No immediate changes in management.  Lack of appetite and weight loss Experiencing lack of appetite and weight loss, possibly related to ongoing medical conditions and treatments.  Prostate cancer Prostatectomy performed over ten years ago. PSA levels have been well-managed but recently increased slightly. Monitoring PSA levels as requested by primary care physician. - Add PSA test to next week's lab work  Sinusitis and bronchitis Recent episodes, currently on the third set of antibiotics and a course of steroids. Plans to follow up with primary care physician for further management.   He was advised to call immediately if he has any other concerning symptoms in the interval.  All questions were answered. The patient knows to call the clinic with any problems, questions or concerns. We can certainly see the patient much sooner if necessary.   Disclaimer: This note was dictated with voice recognition software. Similar sounding words can inadvertently be transcribed and may not be corrected upon review.

## 2023-09-03 ENCOUNTER — Other Ambulatory Visit: Payer: Self-pay

## 2023-09-05 ENCOUNTER — Encounter: Payer: Self-pay | Admitting: Internal Medicine

## 2023-09-05 ENCOUNTER — Other Ambulatory Visit: Payer: Self-pay

## 2023-09-05 ENCOUNTER — Encounter: Payer: Self-pay | Admitting: Physician Assistant

## 2023-09-05 ENCOUNTER — Inpatient Hospital Stay

## 2023-09-05 VITALS — BP 136/71 | HR 78 | Temp 97.9°F | Resp 18

## 2023-09-05 DIAGNOSIS — C61 Malignant neoplasm of prostate: Secondary | ICD-10-CM

## 2023-09-05 DIAGNOSIS — D469 Myelodysplastic syndrome, unspecified: Secondary | ICD-10-CM | POA: Diagnosis not present

## 2023-09-05 LAB — CBC WITH DIFFERENTIAL (CANCER CENTER ONLY)
Abs Immature Granulocytes: 0.16 10*3/uL — ABNORMAL HIGH (ref 0.00–0.07)
Basophils Absolute: 0.2 10*3/uL — ABNORMAL HIGH (ref 0.0–0.1)
Basophils Relative: 2 %
Eosinophils Absolute: 1.6 10*3/uL — ABNORMAL HIGH (ref 0.0–0.5)
Eosinophils Relative: 13 %
HCT: 29.2 % — ABNORMAL LOW (ref 39.0–52.0)
Hemoglobin: 9 g/dL — ABNORMAL LOW (ref 13.0–17.0)
Immature Granulocytes: 1 %
Lymphocytes Relative: 20 %
Lymphs Abs: 2.5 10*3/uL (ref 0.7–4.0)
MCH: 25.6 pg — ABNORMAL LOW (ref 26.0–34.0)
MCHC: 30.8 g/dL (ref 30.0–36.0)
MCV: 83 fL (ref 80.0–100.0)
Monocytes Absolute: 0.8 10*3/uL (ref 0.1–1.0)
Monocytes Relative: 6 %
Neutro Abs: 7 10*3/uL (ref 1.7–7.7)
Neutrophils Relative %: 58 %
Platelet Count: 215 10*3/uL (ref 150–400)
RBC: 3.52 MIL/uL — ABNORMAL LOW (ref 4.22–5.81)
RDW: 34.1 % — ABNORMAL HIGH (ref 11.5–15.5)
WBC Count: 12.2 10*3/uL — ABNORMAL HIGH (ref 4.0–10.5)
nRBC: 0.2 % (ref 0.0–0.2)

## 2023-09-05 LAB — CMP (CANCER CENTER ONLY)
ALT: 21 U/L (ref 0–44)
AST: 15 U/L (ref 15–41)
Albumin: 4.3 g/dL (ref 3.5–5.0)
Alkaline Phosphatase: 56 U/L (ref 38–126)
Anion gap: 3 — ABNORMAL LOW (ref 5–15)
BUN: 19 mg/dL (ref 8–23)
CO2: 26 mmol/L (ref 22–32)
Calcium: 9.2 mg/dL (ref 8.9–10.3)
Chloride: 111 mmol/L (ref 98–111)
Creatinine: 1.17 mg/dL (ref 0.61–1.24)
GFR, Estimated: >60 mL/min (ref >60)
Glucose, Bld: 90 mg/dL (ref 70–99)
Potassium: 4.6 mmol/L (ref 3.5–5.1)
Sodium: 140 mmol/L (ref 135–145)
Total Bilirubin: 0.9 mg/dL (ref 0.0–1.2)
Total Protein: 7.3 g/dL (ref 6.5–8.1)

## 2023-09-05 MED ORDER — EPOETIN ALFA-EPBX 40000 UNIT/ML IJ SOLN
40000.0000 [IU] | Freq: Once | INTRAMUSCULAR | Status: AC
  Administered 2023-09-05: 40000 [IU] via SUBCUTANEOUS
  Filled 2023-09-05: qty 1

## 2023-09-06 ENCOUNTER — Other Ambulatory Visit: Payer: Self-pay | Admitting: Neurology

## 2023-09-06 LAB — PSA, TOTAL AND FREE
PSA, Free Pct: UNDETERMINED %
PSA, Free: <0.02 ng/mL
Prostate Specific Ag, Serum: <0.1 ng/mL (ref 0.0–4.0)

## 2023-09-07 ENCOUNTER — Other Ambulatory Visit: Payer: Self-pay | Admitting: Internal Medicine

## 2023-09-07 ENCOUNTER — Telehealth: Payer: Self-pay

## 2023-09-07 DIAGNOSIS — J019 Acute sinusitis, unspecified: Secondary | ICD-10-CM

## 2023-09-07 NOTE — Telephone Encounter (Signed)
 My chart message regarding appt needed

## 2023-09-08 ENCOUNTER — Ambulatory Visit
Admission: RE | Admit: 2023-09-08 | Discharge: 2023-09-08 | Disposition: A | Discharge status: Home / Self Care | Source: Ambulatory Visit | Attending: Internal Medicine | Admitting: Internal Medicine

## 2023-09-08 DIAGNOSIS — J019 Acute sinusitis, unspecified: Secondary | ICD-10-CM

## 2023-09-12 ENCOUNTER — Inpatient Hospital Stay: Attending: Internal Medicine

## 2023-09-12 ENCOUNTER — Inpatient Hospital Stay

## 2023-09-12 ENCOUNTER — Other Ambulatory Visit: Payer: Self-pay | Admitting: Medical Oncology

## 2023-09-12 VITALS — BP 134/70 | HR 68 | Temp 97.7°F | Resp 18

## 2023-09-12 DIAGNOSIS — Z79899 Other long term (current) drug therapy: Secondary | ICD-10-CM | POA: Diagnosis not present

## 2023-09-12 DIAGNOSIS — D72829 Elevated white blood cell count, unspecified: Secondary | ICD-10-CM | POA: Insufficient documentation

## 2023-09-12 DIAGNOSIS — D473 Essential (hemorrhagic) thrombocythemia: Secondary | ICD-10-CM | POA: Diagnosis not present

## 2023-09-12 DIAGNOSIS — D469 Myelodysplastic syndrome, unspecified: Secondary | ICD-10-CM | POA: Insufficient documentation

## 2023-09-12 DIAGNOSIS — Z8546 Personal history of malignant neoplasm of prostate: Secondary | ICD-10-CM | POA: Diagnosis not present

## 2023-09-12 LAB — CMP (CANCER CENTER ONLY)
ALT: 28 U/L (ref 0–44)
AST: 18 U/L (ref 15–41)
Albumin: 4.3 g/dL (ref 3.5–5.0)
Alkaline Phosphatase: 49 U/L (ref 38–126)
Anion gap: 3 — ABNORMAL LOW (ref 5–15)
BUN: 29 mg/dL — ABNORMAL HIGH (ref 8–23)
CO2: 26 mmol/L (ref 22–32)
Calcium: 9.2 mg/dL (ref 8.9–10.3)
Chloride: 109 mmol/L (ref 98–111)
Creatinine: 1.39 mg/dL — ABNORMAL HIGH (ref 0.61–1.24)
GFR, Estimated: 52 mL/min — ABNORMAL LOW (ref >60)
Glucose, Bld: 93 mg/dL (ref 70–99)
Potassium: 5.1 mmol/L (ref 3.5–5.1)
Sodium: 138 mmol/L (ref 135–145)
Total Bilirubin: 0.8 mg/dL (ref 0.0–1.2)
Total Protein: 7.4 g/dL (ref 6.5–8.1)

## 2023-09-12 LAB — CBC WITH DIFFERENTIAL (CANCER CENTER ONLY)
Abs Immature Granulocytes: 0.06 10*3/uL (ref 0.00–0.07)
Basophils Absolute: 0.2 10*3/uL — ABNORMAL HIGH (ref 0.0–0.1)
Basophils Relative: 3 %
Eosinophils Absolute: 1.4 10*3/uL — ABNORMAL HIGH (ref 0.0–0.5)
Eosinophils Relative: 14 %
HCT: 27.7 % — ABNORMAL LOW (ref 39.0–52.0)
Hemoglobin: 8.6 g/dL — ABNORMAL LOW (ref 13.0–17.0)
Immature Granulocytes: 1 %
Lymphocytes Relative: 22 %
Lymphs Abs: 2.2 10*3/uL (ref 0.7–4.0)
MCH: 25.4 pg — ABNORMAL LOW (ref 26.0–34.0)
MCHC: 31 g/dL (ref 30.0–36.0)
MCV: 81.7 fL (ref 80.0–100.0)
Monocytes Absolute: 0.7 10*3/uL (ref 0.1–1.0)
Monocytes Relative: 7 %
Neutro Abs: 5.2 10*3/uL (ref 1.7–7.7)
Neutrophils Relative %: 53 %
Platelet Count: 255 10*3/uL (ref 150–400)
RBC: 3.39 MIL/uL — ABNORMAL LOW (ref 4.22–5.81)
RDW: 34.1 % — ABNORMAL HIGH (ref 11.5–15.5)
WBC Count: 9.8 10*3/uL (ref 4.0–10.5)
nRBC: 0.2 % (ref 0.0–0.2)

## 2023-09-12 MED ORDER — EPOETIN ALFA-EPBX 40000 UNIT/ML IJ SOLN
40000.0000 [IU] | Freq: Once | INTRAMUSCULAR | Status: AC
  Administered 2023-09-12: 40000 [IU] via SUBCUTANEOUS
  Filled 2023-09-12: qty 1

## 2023-09-19 ENCOUNTER — Inpatient Hospital Stay

## 2023-09-19 VITALS — BP 133/80 | HR 87 | Temp 97.9°F | Resp 17

## 2023-09-19 DIAGNOSIS — D469 Myelodysplastic syndrome, unspecified: Secondary | ICD-10-CM

## 2023-09-19 LAB — CMP (CANCER CENTER ONLY)
ALT: 19 U/L (ref 0–44)
AST: 14 U/L — ABNORMAL LOW (ref 15–41)
Albumin: 4.4 g/dL (ref 3.5–5.0)
Alkaline Phosphatase: 48 U/L (ref 38–126)
Anion gap: 5 (ref 5–15)
BUN: 28 mg/dL — ABNORMAL HIGH (ref 8–23)
CO2: 26 mmol/L (ref 22–32)
Calcium: 9.5 mg/dL (ref 8.9–10.3)
Chloride: 108 mmol/L (ref 98–111)
Creatinine: 1.31 mg/dL — ABNORMAL HIGH (ref 0.61–1.24)
GFR, Estimated: 56 mL/min — ABNORMAL LOW (ref >60)
Glucose, Bld: 97 mg/dL (ref 70–99)
Potassium: 4.7 mmol/L (ref 3.5–5.1)
Sodium: 139 mmol/L (ref 135–145)
Total Bilirubin: 1 mg/dL (ref 0.0–1.2)
Total Protein: 7.3 g/dL (ref 6.5–8.1)

## 2023-09-19 LAB — CBC WITH DIFFERENTIAL (CANCER CENTER ONLY)
Abs Immature Granulocytes: 0.12 10*3/uL — ABNORMAL HIGH (ref 0.00–0.07)
Basophils Absolute: 0.4 10*3/uL — ABNORMAL HIGH (ref 0.0–0.1)
Basophils Relative: 4 %
Eosinophils Absolute: 0.7 10*3/uL — ABNORMAL HIGH (ref 0.0–0.5)
Eosinophils Relative: 6 %
HCT: 26.7 % — ABNORMAL LOW (ref 39.0–52.0)
Hemoglobin: 8.5 g/dL — ABNORMAL LOW (ref 13.0–17.0)
Immature Granulocytes: 1 %
Lymphocytes Relative: 19 %
Lymphs Abs: 2.1 10*3/uL (ref 0.7–4.0)
MCH: 26.1 pg (ref 26.0–34.0)
MCHC: 31.8 g/dL (ref 30.0–36.0)
MCV: 81.9 fL (ref 80.0–100.0)
Monocytes Absolute: 1 10*3/uL (ref 0.1–1.0)
Monocytes Relative: 9 %
Neutro Abs: 6.5 10*3/uL (ref 1.7–7.7)
Neutrophils Relative %: 61 %
Platelet Count: 298 10*3/uL (ref 150–400)
RBC: 3.26 MIL/uL — ABNORMAL LOW (ref 4.22–5.81)
RDW: 34.3 % — ABNORMAL HIGH (ref 11.5–15.5)
WBC Count: 10.8 10*3/uL — ABNORMAL HIGH (ref 4.0–10.5)
nRBC: 0.6 % — ABNORMAL HIGH (ref 0.0–0.2)

## 2023-09-19 MED ORDER — EPOETIN ALFA-EPBX 40000 UNIT/ML IJ SOLN
40000.0000 [IU] | Freq: Once | INTRAMUSCULAR | Status: AC
  Administered 2023-09-19: 40000 [IU] via SUBCUTANEOUS
  Filled 2023-09-19: qty 1

## 2023-09-22 ENCOUNTER — Other Ambulatory Visit: Payer: Self-pay

## 2023-09-26 ENCOUNTER — Inpatient Hospital Stay

## 2023-09-26 VITALS — BP 131/57 | HR 71 | Temp 98.1°F | Resp 16

## 2023-09-26 DIAGNOSIS — D469 Myelodysplastic syndrome, unspecified: Secondary | ICD-10-CM

## 2023-09-26 LAB — CBC WITH DIFFERENTIAL (CANCER CENTER ONLY)
Abs Immature Granulocytes: 0.33 10*3/uL — ABNORMAL HIGH (ref 0.00–0.07)
Basophils Absolute: 0.2 10*3/uL — ABNORMAL HIGH (ref 0.0–0.1)
Basophils Relative: 1 %
Eosinophils Absolute: 1.4 10*3/uL — ABNORMAL HIGH (ref 0.0–0.5)
Eosinophils Relative: 11 %
HCT: 24.6 % — ABNORMAL LOW (ref 39.0–52.0)
Hemoglobin: 7.9 g/dL — ABNORMAL LOW (ref 13.0–17.0)
Immature Granulocytes: 3 %
Lymphocytes Relative: 14 %
Lymphs Abs: 1.8 10*3/uL (ref 0.7–4.0)
MCH: 26.1 pg (ref 26.0–34.0)
MCHC: 32.1 g/dL (ref 30.0–36.0)
MCV: 81.2 fL (ref 80.0–100.0)
Monocytes Absolute: 0.5 10*3/uL (ref 0.1–1.0)
Monocytes Relative: 4 %
Neutro Abs: 8.3 10*3/uL — ABNORMAL HIGH (ref 1.7–7.7)
Neutrophils Relative %: 67 %
Platelet Count: 178 10*3/uL (ref 150–400)
RBC: 3.03 MIL/uL — ABNORMAL LOW (ref 4.22–5.81)
RDW: 34.8 % — ABNORMAL HIGH (ref 11.5–15.5)
WBC Count: 12.3 10*3/uL — ABNORMAL HIGH (ref 4.0–10.5)
nRBC: 0.5 % — ABNORMAL HIGH (ref 0.0–0.2)

## 2023-09-26 LAB — CMP (CANCER CENTER ONLY)
ALT: 20 U/L (ref 0–44)
AST: 16 U/L (ref 15–41)
Albumin: 4.3 g/dL (ref 3.5–5.0)
Alkaline Phosphatase: 50 U/L (ref 38–126)
Anion gap: 5 (ref 5–15)
BUN: 17 mg/dL (ref 8–23)
CO2: 28 mmol/L (ref 22–32)
Calcium: 9.1 mg/dL (ref 8.9–10.3)
Chloride: 109 mmol/L (ref 98–111)
Creatinine: 1.22 mg/dL (ref 0.61–1.24)
GFR, Estimated: >60 mL/min (ref >60)
Glucose, Bld: 129 mg/dL — ABNORMAL HIGH (ref 70–99)
Potassium: 4.2 mmol/L (ref 3.5–5.1)
Sodium: 142 mmol/L (ref 135–145)
Total Bilirubin: 0.8 mg/dL (ref 0.0–1.2)
Total Protein: 7 g/dL (ref 6.5–8.1)

## 2023-09-26 MED ORDER — EPOETIN ALFA-EPBX 40000 UNIT/ML IJ SOLN
40000.0000 [IU] | Freq: Once | INTRAMUSCULAR | Status: AC
  Administered 2023-09-26: 40000 [IU] via SUBCUTANEOUS
  Filled 2023-09-26: qty 1

## 2023-09-26 NOTE — Patient Instructions (Signed)

## 2023-10-03 ENCOUNTER — Inpatient Hospital Stay

## 2023-10-03 ENCOUNTER — Ambulatory Visit: Admitting: Internal Medicine

## 2023-10-03 VITALS — BP 127/69 | HR 70 | Temp 98.0°F | Resp 18

## 2023-10-03 DIAGNOSIS — D469 Myelodysplastic syndrome, unspecified: Secondary | ICD-10-CM

## 2023-10-03 LAB — CBC WITH DIFFERENTIAL (CANCER CENTER ONLY)
Abs Immature Granulocytes: 0.21 10*3/uL — ABNORMAL HIGH (ref 0.00–0.07)
Basophils Absolute: 0.2 10*3/uL — ABNORMAL HIGH (ref 0.0–0.1)
Basophils Relative: 1 %
Eosinophils Absolute: 2.2 10*3/uL — ABNORMAL HIGH (ref 0.0–0.5)
Eosinophils Relative: 14 %
HCT: 26.1 % — ABNORMAL LOW (ref 39.0–52.0)
Hemoglobin: 8.1 g/dL — ABNORMAL LOW (ref 13.0–17.0)
Immature Granulocytes: 1 %
Lymphocytes Relative: 16 %
Lymphs Abs: 2.6 10*3/uL (ref 0.7–4.0)
MCH: 25.6 pg — ABNORMAL LOW (ref 26.0–34.0)
MCHC: 31 g/dL (ref 30.0–36.0)
MCV: 82.3 fL (ref 80.0–100.0)
Monocytes Absolute: 1.4 10*3/uL — ABNORMAL HIGH (ref 0.1–1.0)
Monocytes Relative: 8 %
Neutro Abs: 9.5 10*3/uL — ABNORMAL HIGH (ref 1.7–7.7)
Neutrophils Relative %: 60 %
Platelet Count: 272 10*3/uL (ref 150–400)
RBC: 3.17 MIL/uL — ABNORMAL LOW (ref 4.22–5.81)
RDW: 36.2 % — ABNORMAL HIGH (ref 11.5–15.5)
WBC Count: 16.1 10*3/uL — ABNORMAL HIGH (ref 4.0–10.5)
nRBC: 0.2 % (ref 0.0–0.2)

## 2023-10-03 MED ORDER — EPOETIN ALFA-EPBX 40000 UNIT/ML IJ SOLN
40000.0000 [IU] | Freq: Once | INTRAMUSCULAR | Status: AC
  Administered 2023-10-03: 40000 [IU] via SUBCUTANEOUS
  Filled 2023-10-03: qty 1

## 2023-10-04 ENCOUNTER — Encounter: Payer: Self-pay | Admitting: Physician Assistant

## 2023-10-04 ENCOUNTER — Ambulatory Visit: Admitting: Internal Medicine

## 2023-10-04 ENCOUNTER — Encounter: Payer: Self-pay | Admitting: Internal Medicine

## 2023-10-04 VITALS — BP 122/62 | HR 62 | Ht 69.0 in | Wt 152.0 lb

## 2023-10-04 DIAGNOSIS — D469 Myelodysplastic syndrome, unspecified: Secondary | ICD-10-CM | POA: Diagnosis not present

## 2023-10-04 DIAGNOSIS — K649 Unspecified hemorrhoids: Secondary | ICD-10-CM

## 2023-10-04 DIAGNOSIS — R197 Diarrhea, unspecified: Secondary | ICD-10-CM

## 2023-10-04 DIAGNOSIS — Z8719 Personal history of other diseases of the digestive system: Secondary | ICD-10-CM

## 2023-10-04 DIAGNOSIS — R110 Nausea: Secondary | ICD-10-CM | POA: Diagnosis not present

## 2023-10-04 DIAGNOSIS — K580 Irritable bowel syndrome with diarrhea: Secondary | ICD-10-CM

## 2023-10-04 DIAGNOSIS — K219 Gastro-esophageal reflux disease without esophagitis: Secondary | ICD-10-CM

## 2023-10-04 DIAGNOSIS — Z8619 Personal history of other infectious and parasitic diseases: Secondary | ICD-10-CM

## 2023-10-04 MED ORDER — HYOSCYAMINE SULFATE 0.125 MG SL SUBL: 0.1250 mg | SUBLINGUAL_TABLET | Freq: Four times a day (QID) | SUBLINGUAL | 11 refills | Status: AC | PRN

## 2023-10-04 MED ORDER — CHOLESTYRAMINE 4 G PO PACK: 4.0000 g | PACK | Freq: Two times a day (BID) | ORAL | 3 refills | Status: AC

## 2023-10-04 NOTE — Patient Instructions (Signed)
 We have sent the following medications to your pharmacy for you to pick up at your convenience:  Questran , Levsin   Please follow up as needed.  _______________________________________________________  If your blood pressure at your visit was 140/90 or greater, please contact your primary care physician to follow up on this.  _______________________________________________________  If you are age 78 or older, your body mass index should be between 23-30. Your Body mass index is 22.45 kg/m. If this is out of the aforementioned range listed, please consider follow up with your Primary Care Provider.  If you are age 5 or younger, your body mass index should be between 19-25. Your Body mass index is 22.45 kg/m. If this is out of the aformentioned range listed, please consider follow up with your Primary Care Provider.   ________________________________________________________  The [facility] providers would like to encourage you to use MYCHART to communicate with providers for non-urgent requests or questions.  Due to long hold times on the telephone, sending your provider a message by [facility] may be a faster and more efficient way to get a response.  Please allow 48 business hours for a response.  Please remember that this is for non-urgent requests.  _______________________________________________________

## 2023-10-04 NOTE — Progress Notes (Signed)
 HISTORY OF PRESENT ILLNESS:  Roy Johnson is a 78 y.o. 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  Also has a prior history of C. difficile and norovirus related diarrhea.  He has also been followed for chronic GERD and history of diverticular disease.  He presents today for follow-up regarding these issues and request medication refill.  He was last seen over 15 2023.  For his diarrhea he takes Questran  1 4 g packet daily.  With this regimen he has about 3 bowel movements per day.  He is no longer needing Lomotil.  He will take occasional sublingual Levsin  for abdominal cramping.  This helps.  He requests refill of both Questran  and Levsin .  He does report recently being on antibiotics for a number of different issues including tooth abscess.  This has not disturbed his lower GI complaints beyond baseline.  Next, he does mention chronic reflux.  He takes over-the-counter Nexium about 3 times per week.  Does experience significant nausea.  He lives in [place]  several months out of the year.  About 3 months ago he was having abdominal pain at his oncologist for the CT scan.  According to the patient there were no acute abnormalities.  Laboratories: Blood work from September 26, 2023 shows unremarkable comprehensive metabolic panel.  CBC with white blood cell count 12.3, hemoglobin 7.9, platelet count 178,000  REVIEW OF SYSTEMS:  All non-GI ROS negative unless otherwise stated in the HPI except for fatigue, hearing problems, sleeping problems, urinary leakage  Past Medical History:  Diagnosis Date   Anemia    Anesthesia of skin    Calculus of gallbladder with acute cholecystitis, with obstruction    Cellulitis of back    Chest pain    Chronic kidney disease (CKD), stage III (moderate) (HCC)    Colon polyp    Cough    Disorientation    Diverticulitis    Diverticulosis    Dyspnea on exertion    Elevated white blood cell  count    Fatigue    Forgetfulness    GERD (gastroesophageal reflux disease)    Hearing loss    HTN (hypertension)    Hydrocele    Hyperkalemia    Hyperlipidemia    Hypertensive chronic kidney disease with stage 1 through stage 4 chronic kidney disease, or unspecified chronic kidney disease    Hypokalemia    IBS (irritable bowel syndrome)    Impacted cerumen, bilateral    Inguinal hernia    Insomnia    Lightheadedness    Liver disease    Lower abdominal pain    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   Mixed stress and urge urinary incontinence    Myelodysplastic syndrome (HCC)    Nausea with vomiting    Numbness and tingling in both hands    Other amnesia    Other benign neoplasm of skin, unspecified    Overflow incontinence of urine    Pain in right leg    Paresthesia of skin    Prostate cancer (HCC)    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)    Right upper quadrant pain    Sinusitis, acute    SOB (shortness of breath)    Thrombocythemia    Umbilical hernia    Vitamin D deficiency    Weakness     Past Surgical History:  Procedure Laterality Date   COLONOSCOPY     POLYPECTOMY     PROSTATECTOMY  TONSILLECTOMY      Social History FOY VANDUYNE  reports that he quit smoking about 50 years ago. His smoking use included cigarettes. He has never used smokeless tobacco. He reports that he does not currently use alcohol. He reports that he does not use drugs.  family history includes Breast cancer in his mother; Colon polyps in his father; Heart disease in his father; Lung cancer in his father; Osteoarthritis in his mother; Prostate cancer in his father.  No Known Allergies     PHYSICAL EXAMINATION:  PHYSICAL EXAMINATION: Vital signs: BP 122/62   Pulse 62   Ht 5' 9 (1.753 m)   Wt 152 lb (68.9 kg)   BMI 22.45 kg/m   Constitutional: generally well-appearing, no acute distress Psychiatric: alert and oriented x3, cooperative Eyes: extraocular movements  intact, anicteric, conjunctiva pink Mouth: oral pharynx moist, no lesions Neck: supple no lymphadenopathy Cardiovascular: heart regular rate and rhythm, no murmur Lungs: clear to auscultation bilaterally Abdomen: soft, nontender, nondistended, no obvious ascites, no peritoneal signs, normal bowel sounds, no organomegaly Rectal: Omitted Extremities: no clubbing, cyanosis, or lower extremity edema bilaterally Skin: no lesions on visible extremities Neuro: No focal deficits.  Cranial nerves intact  ASSESSMENT:  1.  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  On Questran  1 packet/day.  Still with 3 bowel movements per day 2.  History of C. difficile and norovirus related diarrhea. 3.  Chronic GERD.  Breakthrough symptoms.  Infrequent PPI use 4.  Nausea.  Likely related to GERD 5.  History of diverticular disease.  Last colonoscopy 2013 6.  Myelodysplastic syndrome   PLAN:  1.  Prescribed Questran  4 g packet p.o. twice daily.  Titrate to need 2.  Prescribed Levsin  sublingual every 4 hours as needed 3.  Recommended Nexium OTC daily 4.  Reflux precautions 5.  Routine office follow-up 1 year A total time of 30 minutes was spent preparing to see the patient, obtaining comprehensive history, performing medically appropriate physical examination, counseling and educating the patient regarding the above listed issues, ordering multiple medications, defining follow-up parameters, and documenting clinical information in the health record

## 2023-10-10 ENCOUNTER — Inpatient Hospital Stay

## 2023-10-10 ENCOUNTER — Inpatient Hospital Stay: Attending: Internal Medicine

## 2023-10-10 VITALS — BP 138/68 | HR 65 | Resp 17

## 2023-10-10 DIAGNOSIS — R531 Weakness: Secondary | ICD-10-CM | POA: Diagnosis not present

## 2023-10-10 DIAGNOSIS — Z79899 Other long term (current) drug therapy: Secondary | ICD-10-CM | POA: Insufficient documentation

## 2023-10-10 DIAGNOSIS — D469 Myelodysplastic syndrome, unspecified: Secondary | ICD-10-CM | POA: Insufficient documentation

## 2023-10-10 DIAGNOSIS — D693 Immune thrombocytopenic purpura: Secondary | ICD-10-CM | POA: Diagnosis not present

## 2023-10-10 DIAGNOSIS — R0600 Dyspnea, unspecified: Secondary | ICD-10-CM | POA: Diagnosis not present

## 2023-10-10 DIAGNOSIS — D72829 Elevated white blood cell count, unspecified: Secondary | ICD-10-CM | POA: Diagnosis not present

## 2023-10-10 DIAGNOSIS — R194 Change in bowel habit: Secondary | ICD-10-CM | POA: Insufficient documentation

## 2023-10-10 DIAGNOSIS — T451X5A Adverse effect of antineoplastic and immunosuppressive drugs, initial encounter: Secondary | ICD-10-CM | POA: Diagnosis not present

## 2023-10-10 DIAGNOSIS — G620 Drug-induced polyneuropathy: Secondary | ICD-10-CM | POA: Diagnosis not present

## 2023-10-10 LAB — CBC WITH DIFFERENTIAL (CANCER CENTER ONLY)
Abs Immature Granulocytes: 0.09 10*3/uL — ABNORMAL HIGH (ref 0.00–0.07)
Basophils Absolute: 0.2 10*3/uL — ABNORMAL HIGH (ref 0.0–0.1)
Basophils Relative: 2 %
Eosinophils Absolute: 1.6 10*3/uL — ABNORMAL HIGH (ref 0.0–0.5)
Eosinophils Relative: 17 %
HCT: 27.2 % — ABNORMAL LOW (ref 39.0–52.0)
Hemoglobin: 8.5 g/dL — ABNORMAL LOW (ref 13.0–17.0)
Immature Granulocytes: 1 %
Lymphocytes Relative: 21 %
Lymphs Abs: 2 10*3/uL (ref 0.7–4.0)
MCH: 25.8 pg — ABNORMAL LOW (ref 26.0–34.0)
MCHC: 31.3 g/dL (ref 30.0–36.0)
MCV: 82.7 fL (ref 80.0–100.0)
Monocytes Absolute: 0.6 10*3/uL (ref 0.1–1.0)
Monocytes Relative: 6 %
Neutro Abs: 5 10*3/uL (ref 1.7–7.7)
Neutrophils Relative %: 53 %
Platelet Count: 224 10*3/uL (ref 150–400)
RBC: 3.29 MIL/uL — ABNORMAL LOW (ref 4.22–5.81)
RDW: 36.6 % — ABNORMAL HIGH (ref 11.5–15.5)
WBC Count: 9.4 10*3/uL (ref 4.0–10.5)
nRBC: 0.3 % — ABNORMAL HIGH (ref 0.0–0.2)

## 2023-10-10 LAB — CMP (CANCER CENTER ONLY)
ALT: 19 U/L (ref 0–44)
AST: 13 U/L — ABNORMAL LOW (ref 15–41)
Albumin: 4.5 g/dL (ref 3.5–5.0)
Alkaline Phosphatase: 56 U/L (ref 38–126)
Anion gap: 5 (ref 5–15)
BUN: 21 mg/dL (ref 8–23)
CO2: 26 mmol/L (ref 22–32)
Calcium: 9.3 mg/dL (ref 8.9–10.3)
Chloride: 109 mmol/L (ref 98–111)
Creatinine: 1.21 mg/dL (ref 0.61–1.24)
GFR, Estimated: >60 mL/min (ref >60)
Glucose, Bld: 93 mg/dL (ref 70–99)
Potassium: 4.8 mmol/L (ref 3.5–5.1)
Sodium: 140 mmol/L (ref 135–145)
Total Bilirubin: 1 mg/dL (ref 0.0–1.2)
Total Protein: 7.2 g/dL (ref 6.5–8.1)

## 2023-10-10 MED ORDER — EPOETIN ALFA-EPBX 40000 UNIT/ML IJ SOLN
40000.0000 [IU] | Freq: Once | INTRAMUSCULAR | Status: AC
  Administered 2023-10-10: 40000 [IU] via SUBCUTANEOUS
  Filled 2023-10-10: qty 1

## 2023-10-10 NOTE — Progress Notes (Signed)
 Received a message from [facility] in the lab stating that patients HGB is 8.5 today. Can proceed with Retacrit  injection today per providers.

## 2023-10-17 ENCOUNTER — Inpatient Hospital Stay

## 2023-10-17 VITALS — BP 134/80 | HR 73 | Temp 97.8°F | Resp 18

## 2023-10-17 DIAGNOSIS — D469 Myelodysplastic syndrome, unspecified: Secondary | ICD-10-CM

## 2023-10-17 LAB — CBC WITH DIFFERENTIAL (CANCER CENTER ONLY)
Abs Immature Granulocytes: 0.1 K/uL — ABNORMAL HIGH (ref 0.00–0.07)
Basophils Absolute: 0.3 K/uL — ABNORMAL HIGH (ref 0.0–0.1)
Basophils Relative: 3 %
Eosinophils Absolute: 0.6 K/uL — ABNORMAL HIGH (ref 0.0–0.5)
Eosinophils Relative: 5 %
HCT: 28 % — ABNORMAL LOW (ref 39.0–52.0)
Hemoglobin: 8.7 g/dL — ABNORMAL LOW (ref 13.0–17.0)
Immature Granulocytes: 1 %
Lymphocytes Relative: 20 %
Lymphs Abs: 2.2 K/uL (ref 0.7–4.0)
MCH: 26.4 pg (ref 26.0–34.0)
MCHC: 31.1 g/dL (ref 30.0–36.0)
MCV: 85.1 fL (ref 80.0–100.0)
Monocytes Absolute: 0.9 K/uL (ref 0.1–1.0)
Monocytes Relative: 8 %
Neutro Abs: 6.9 K/uL (ref 1.7–7.7)
Neutrophils Relative %: 63 %
Platelet Count: 179 K/uL (ref 150–400)
RBC: 3.29 MIL/uL — ABNORMAL LOW (ref 4.22–5.81)
RDW: 35.3 % — ABNORMAL HIGH (ref 11.5–15.5)
WBC Count: 11.1 K/uL — ABNORMAL HIGH (ref 4.0–10.5)
nRBC: 0.5 % — ABNORMAL HIGH (ref 0.0–0.2)

## 2023-10-17 MED ORDER — EPOETIN ALFA-EPBX 40000 UNIT/ML IJ SOLN
40000.0000 [IU] | Freq: Once | INTRAMUSCULAR | Status: AC
  Administered 2023-10-17: 40000 [IU] via SUBCUTANEOUS
  Filled 2023-10-17: qty 1

## 2023-10-24 ENCOUNTER — Inpatient Hospital Stay

## 2023-10-24 VITALS — BP 139/70 | HR 70 | Temp 98.0°F | Resp 17

## 2023-10-24 DIAGNOSIS — D469 Myelodysplastic syndrome, unspecified: Secondary | ICD-10-CM

## 2023-10-24 LAB — CBC WITH DIFFERENTIAL (CANCER CENTER ONLY)
Abs Immature Granulocytes: 0.26 K/uL — ABNORMAL HIGH (ref 0.00–0.07)
Basophils Absolute: 0.2 K/uL — ABNORMAL HIGH (ref 0.0–0.1)
Basophils Relative: 2 %
Eosinophils Absolute: 0.9 K/uL — ABNORMAL HIGH (ref 0.0–0.5)
Eosinophils Relative: 8 %
HCT: 30.1 % — ABNORMAL LOW (ref 39.0–52.0)
Hemoglobin: 9.3 g/dL — ABNORMAL LOW (ref 13.0–17.0)
Immature Granulocytes: 2 %
Lymphocytes Relative: 17 %
Lymphs Abs: 2.1 K/uL (ref 0.7–4.0)
MCH: 26.4 pg (ref 26.0–34.0)
MCHC: 30.9 g/dL (ref 30.0–36.0)
MCV: 85.5 fL (ref 80.0–100.0)
Monocytes Absolute: 0.4 K/uL (ref 0.1–1.0)
Monocytes Relative: 3 %
Neutro Abs: 8.6 K/uL — ABNORMAL HIGH (ref 1.7–7.7)
Neutrophils Relative %: 68 %
Platelet Count: 265 K/uL (ref 150–400)
RBC: 3.52 MIL/uL — ABNORMAL LOW (ref 4.22–5.81)
RDW: 35.2 % — ABNORMAL HIGH (ref 11.5–15.5)
WBC Count: 12.6 K/uL — ABNORMAL HIGH (ref 4.0–10.5)
nRBC: 0.2 % (ref 0.0–0.2)

## 2023-10-24 LAB — CMP (CANCER CENTER ONLY)
ALT: 12 U/L (ref 0–44)
AST: 13 U/L — ABNORMAL LOW (ref 15–41)
Albumin: 4.4 g/dL (ref 3.5–5.0)
Alkaline Phosphatase: 50 U/L (ref 38–126)
Anion gap: 5 (ref 5–15)
BUN: 24 mg/dL — ABNORMAL HIGH (ref 8–23)
CO2: 26 mmol/L (ref 22–32)
Calcium: 9.2 mg/dL (ref 8.9–10.3)
Chloride: 109 mmol/L (ref 98–111)
Creatinine: 1.25 mg/dL — ABNORMAL HIGH (ref 0.61–1.24)
GFR, Estimated: 59 mL/min — ABNORMAL LOW (ref >60)
Glucose, Bld: 93 mg/dL (ref 70–99)
Potassium: 4.5 mmol/L (ref 3.5–5.1)
Sodium: 140 mmol/L (ref 135–145)
Total Bilirubin: 0.9 mg/dL (ref 0.0–1.2)
Total Protein: 7 g/dL (ref 6.5–8.1)

## 2023-10-24 MED ORDER — EPOETIN ALFA-EPBX 40000 UNIT/ML IJ SOLN
40000.0000 [IU] | Freq: Once | INTRAMUSCULAR | Status: AC
  Administered 2023-10-24: 40000 [IU] via SUBCUTANEOUS
  Filled 2023-10-24: qty 1

## 2023-10-31 ENCOUNTER — Inpatient Hospital Stay

## 2023-10-31 ENCOUNTER — Other Ambulatory Visit: Payer: Self-pay | Admitting: Neurology

## 2023-10-31 VITALS — BP 136/78 | HR 72 | Temp 97.8°F | Resp 18

## 2023-10-31 DIAGNOSIS — D469 Myelodysplastic syndrome, unspecified: Secondary | ICD-10-CM

## 2023-10-31 LAB — CBC WITH DIFFERENTIAL (CANCER CENTER ONLY)
Abs Immature Granulocytes: 0.22 K/uL — ABNORMAL HIGH (ref 0.00–0.07)
Basophils Absolute: 0.3 K/uL — ABNORMAL HIGH (ref 0.0–0.1)
Basophils Relative: 2 %
Eosinophils Absolute: 2.1 K/uL — ABNORMAL HIGH (ref 0.0–0.5)
Eosinophils Relative: 13 %
HCT: 30 % — ABNORMAL LOW (ref 39.0–52.0)
Hemoglobin: 9.4 g/dL — ABNORMAL LOW (ref 13.0–17.0)
Immature Granulocytes: 1 %
Lymphocytes Relative: 16 %
Lymphs Abs: 2.5 K/uL (ref 0.7–4.0)
MCH: 26.3 pg (ref 26.0–34.0)
MCHC: 31.3 g/dL (ref 30.0–36.0)
MCV: 84 fL (ref 80.0–100.0)
Monocytes Absolute: 1.4 K/uL — ABNORMAL HIGH (ref 0.1–1.0)
Monocytes Relative: 8 %
Neutro Abs: 9.8 K/uL — ABNORMAL HIGH (ref 1.7–7.7)
Neutrophils Relative %: 60 %
Platelet Count: 212 K/uL (ref 150–400)
RBC: 3.57 MIL/uL — ABNORMAL LOW (ref 4.22–5.81)
RDW: 36.3 % — ABNORMAL HIGH (ref 11.5–15.5)
WBC Count: 16.3 K/uL — ABNORMAL HIGH (ref 4.0–10.5)
nRBC: 0.1 % (ref 0.0–0.2)

## 2023-10-31 MED ORDER — EPOETIN ALFA-EPBX 40000 UNIT/ML IJ SOLN
40000.0000 [IU] | Freq: Once | INTRAMUSCULAR | Status: AC
  Administered 2023-10-31: 40000 [IU] via SUBCUTANEOUS
  Filled 2023-10-31: qty 1

## 2023-11-07 ENCOUNTER — Inpatient Hospital Stay

## 2023-11-07 VITALS — BP 148/73 | HR 73 | Temp 97.8°F | Resp 18

## 2023-11-07 DIAGNOSIS — D469 Myelodysplastic syndrome, unspecified: Secondary | ICD-10-CM | POA: Diagnosis not present

## 2023-11-07 LAB — CBC WITH DIFFERENTIAL (CANCER CENTER ONLY)
Abs Immature Granulocytes: 0.09 K/uL — ABNORMAL HIGH (ref 0.00–0.07)
Basophils Absolute: 0.4 K/uL — ABNORMAL HIGH (ref 0.0–0.1)
Basophils Relative: 3 %
Eosinophils Absolute: 1.1 K/uL — ABNORMAL HIGH (ref 0.0–0.5)
Eosinophils Relative: 9 %
HCT: 29.8 % — ABNORMAL LOW (ref 39.0–52.0)
Hemoglobin: 9.5 g/dL — ABNORMAL LOW (ref 13.0–17.0)
Immature Granulocytes: 1 %
Lymphocytes Relative: 18 %
Lymphs Abs: 2.1 K/uL (ref 0.7–4.0)
MCH: 26.8 pg (ref 26.0–34.0)
MCHC: 31.9 g/dL (ref 30.0–36.0)
MCV: 83.9 fL (ref 80.0–100.0)
Monocytes Absolute: 0.9 K/uL (ref 0.1–1.0)
Monocytes Relative: 7 %
Neutro Abs: 7.3 K/uL (ref 1.7–7.7)
Neutrophils Relative %: 62 %
Platelet Count: 221 K/uL (ref 150–400)
RBC: 3.55 MIL/uL — ABNORMAL LOW (ref 4.22–5.81)
RDW: 35.1 % — ABNORMAL HIGH (ref 11.5–15.5)
WBC Count: 11.8 K/uL — ABNORMAL HIGH (ref 4.0–10.5)
nRBC: 0.3 % — ABNORMAL HIGH (ref 0.0–0.2)

## 2023-11-07 LAB — CMP (CANCER CENTER ONLY)
ALT: 16 U/L (ref 0–44)
AST: 11 U/L — ABNORMAL LOW (ref 15–41)
Albumin: 4.4 g/dL (ref 3.5–5.0)
Alkaline Phosphatase: 57 U/L (ref 38–126)
Anion gap: 4 — ABNORMAL LOW (ref 5–15)
BUN: 26 mg/dL — ABNORMAL HIGH (ref 8–23)
CO2: 26 mmol/L (ref 22–32)
Calcium: 9.4 mg/dL (ref 8.9–10.3)
Chloride: 109 mmol/L (ref 98–111)
Creatinine: 1.19 mg/dL (ref 0.61–1.24)
GFR, Estimated: >60 mL/min (ref >60)
Glucose, Bld: 96 mg/dL (ref 70–99)
Potassium: 4.7 mmol/L (ref 3.5–5.1)
Sodium: 139 mmol/L (ref 135–145)
Total Bilirubin: 0.7 mg/dL (ref 0.0–1.2)
Total Protein: 7.5 g/dL (ref 6.5–8.1)

## 2023-11-07 MED ORDER — EPOETIN ALFA-EPBX 40000 UNIT/ML IJ SOLN
40000.0000 [IU] | Freq: Once | INTRAMUSCULAR | Status: AC
  Administered 2023-11-07: 40000 [IU] via SUBCUTANEOUS
  Filled 2023-11-07: qty 1

## 2023-11-07 NOTE — Progress Notes (Signed)
 Received message from [facility] in the lab stating that patients hgb is 9.5 today.  Patient can proceed with Retacrit  injection today per providers.

## 2023-11-13 NOTE — Progress Notes (Signed)
 Patient here for appointment and requiring Luspatercept  after reviewing lab work. This was given by J. Abigail, RN. Patient tolerated well.

## 2023-11-14 ENCOUNTER — Inpatient Hospital Stay: Attending: Internal Medicine | Admitting: Internal Medicine

## 2023-11-14 ENCOUNTER — Inpatient Hospital Stay

## 2023-11-14 VITALS — BP 140/68 | HR 69 | Temp 97.7°F | Resp 17 | Ht 69.0 in | Wt 149.0 lb

## 2023-11-14 DIAGNOSIS — D75839 Thrombocytosis, unspecified: Secondary | ICD-10-CM | POA: Diagnosis not present

## 2023-11-14 DIAGNOSIS — T451X5A Adverse effect of antineoplastic and immunosuppressive drugs, initial encounter: Secondary | ICD-10-CM | POA: Diagnosis not present

## 2023-11-14 DIAGNOSIS — Z86018 Personal history of other benign neoplasm: Secondary | ICD-10-CM | POA: Diagnosis not present

## 2023-11-14 DIAGNOSIS — Z9089 Acquired absence of other organs: Secondary | ICD-10-CM | POA: Diagnosis not present

## 2023-11-14 DIAGNOSIS — D72829 Elevated white blood cell count, unspecified: Secondary | ICD-10-CM | POA: Diagnosis not present

## 2023-11-14 DIAGNOSIS — Z7961 Long term (current) use of immunomodulator: Secondary | ICD-10-CM | POA: Insufficient documentation

## 2023-11-14 DIAGNOSIS — D473 Essential (hemorrhagic) thrombocythemia: Secondary | ICD-10-CM | POA: Insufficient documentation

## 2023-11-14 DIAGNOSIS — D469 Myelodysplastic syndrome, unspecified: Secondary | ICD-10-CM | POA: Diagnosis not present

## 2023-11-14 DIAGNOSIS — R0981 Nasal congestion: Secondary | ICD-10-CM | POA: Insufficient documentation

## 2023-11-14 DIAGNOSIS — D461 Refractory anemia with ring sideroblasts: Secondary | ICD-10-CM | POA: Diagnosis present

## 2023-11-14 DIAGNOSIS — R053 Chronic cough: Secondary | ICD-10-CM | POA: Insufficient documentation

## 2023-11-14 DIAGNOSIS — Z860100 Personal history of colon polyps, unspecified: Secondary | ICD-10-CM | POA: Insufficient documentation

## 2023-11-14 DIAGNOSIS — G620 Drug-induced polyneuropathy: Secondary | ICD-10-CM | POA: Diagnosis not present

## 2023-11-14 DIAGNOSIS — Z79899 Other long term (current) drug therapy: Secondary | ICD-10-CM | POA: Insufficient documentation

## 2023-11-14 DIAGNOSIS — Z9079 Acquired absence of other genital organ(s): Secondary | ICD-10-CM | POA: Insufficient documentation

## 2023-11-14 LAB — CBC WITH DIFFERENTIAL (CANCER CENTER ONLY)
Abs Immature Granulocytes: 0.78 K/uL — ABNORMAL HIGH (ref 0.00–0.07)
Basophils Absolute: 0.3 K/uL — ABNORMAL HIGH (ref 0.0–0.1)
Basophils Relative: 2 %
Eosinophils Absolute: 1.2 K/uL — ABNORMAL HIGH (ref 0.0–0.5)
Eosinophils Relative: 7 %
HCT: 32.3 % — ABNORMAL LOW (ref 39.0–52.0)
Hemoglobin: 10.1 g/dL — ABNORMAL LOW (ref 13.0–17.0)
Immature Granulocytes: 4 %
Lymphocytes Relative: 14 %
Lymphs Abs: 2.5 K/uL (ref 0.7–4.0)
MCH: 26.3 pg (ref 26.0–34.0)
MCHC: 31.3 g/dL (ref 30.0–36.0)
MCV: 84.1 fL (ref 80.0–100.0)
Monocytes Absolute: 0.7 K/uL (ref 0.1–1.0)
Monocytes Relative: 4 %
Neutro Abs: 12.5 K/uL — ABNORMAL HIGH (ref 1.7–7.7)
Neutrophils Relative %: 69 %
Platelet Count: 369 K/uL (ref 150–400)
RBC: 3.84 MIL/uL — ABNORMAL LOW (ref 4.22–5.81)
RDW: 35.6 % — ABNORMAL HIGH (ref 11.5–15.5)
WBC Count: 17.9 K/uL — ABNORMAL HIGH (ref 4.0–10.5)
nRBC: 0.6 % — ABNORMAL HIGH (ref 0.0–0.2)

## 2023-11-14 MED ORDER — EPOETIN ALFA-EPBX 40000 UNIT/ML IJ SOLN
40000.0000 [IU] | Freq: Once | INTRAMUSCULAR | Status: AC
  Administered 2023-11-14: 40000 [IU] via SUBCUTANEOUS
  Filled 2023-11-14: qty 1

## 2023-11-14 NOTE — Progress Notes (Signed)
 [facility]  [phone] Fax:([phone]) [phone]  OFFICE PROGRESS NOTE  Roy Toribio MATSU, MD [address] 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   PRIOR THERAPY: Previous treatment with combination of Hydrea  and anagrelide .  CURRENT THERAPY:  1) Revlimid 5 mg by mouth daily 2 weeks on and 2 weeks off.  Started 03/25/2016.  Managed by Dr. Jerrye at [facility]. 2) anagrelide  0.5 mg p.o. twice daily. 3) Aranesp  300 mcg subcutaneously every 3 weeks.  This is switched to Procrit  400 mcg subcutaneously every 2 weeks starting April 13, 2017.  The frequency of the Procrit  was changed to weekly schedule when he was in [place] .  He is currently on Retacrit  weekly as a biosimilar to Procrit .  INTERVAL HISTORY: Roy Johnson 78 y.o. male returns to the clinic today for follow-up visit. Discussed the use of AI scribe software for clinical note transcription with the patient, who gave verbal consent to proceed.  History of Present Illness Roy Johnson is a 78 year old male with myelodysplastic syndrome and essential thrombocythemia who presents for evaluation and repeat blood work.  He has a history of myelodysplastic syndrome with refractory anemia and ring sideroblasts, as well as essential thrombocythemia with a positive JAK2 mutation, diagnosed in August 2017. He is currently undergoing treatment with Revlimid, anagrelide , and Retacrit  on a weekly basis. Recently, his treatment plan was adjusted to discontinue Revlimid temporarily and introduce Luspatercept , which he received for the first time yesterday. His white blood cell counts have been fluctuating, and he reports that when he stops taking Revlimid, his white count goes down to normal levels.  He experienced a significant bowel  movement following the Luspatercept  injection, which he attributes to the medication. His appetite remains poor, and he has experienced a slight weight loss, noting a drop of about a pound recently. His current weight is around 150 pounds.  He has a history of sinus issues, for which he was seen by an ENT in June. He experienced a persistent cough and sinus congestion upon returning from [place] , which eventually led to a tooth extraction. His hemoglobin level was noted to be 10.1 at his last check, which is an improvement from previous levels.  He experiences tingling in his hands and legs, which he associates with Revlimid use, suspecting it may be causing neuropathy. He is concerned about the long-term management of his bone marrow condition, which has been controlled with Revlimid for several years.  He is currently maintaining his weekly Retacrit  injections and is awaiting further appointments for Luspatercept  administration. He plans to continue this regimen while managing the logistics of receiving treatment during his time in [place] .    MEDICAL HISTORY: Past Medical History:  Diagnosis Date   Anemia    Anesthesia of skin    Calculus of gallbladder with acute cholecystitis, with obstruction    Cellulitis of back    Chest pain    Chronic kidney disease (CKD), stage III (moderate) (HCC)    Colon polyp    Cough    Disorientation    Diverticulitis    Diverticulosis    Dyspnea on exertion    Elevated white blood cell count    Fatigue    Forgetfulness    GERD (gastroesophageal reflux disease)    Hearing loss    HTN (hypertension)    Hydrocele    Hyperkalemia  Hyperlipidemia    Hypertensive chronic kidney disease with stage 1 through stage 4 chronic kidney disease, or unspecified chronic kidney disease    Hypokalemia    IBS (irritable bowel syndrome)    Impacted cerumen, bilateral    Inguinal hernia    Insomnia    Lightheadedness    Liver disease    Lower abdominal pain     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   Mixed stress and urge urinary incontinence    Myelodysplastic syndrome (HCC)    Nausea with vomiting    Numbness and tingling in both hands    Other amnesia    Other benign neoplasm of skin, unspecified    Overflow incontinence of urine    Pain in right leg    Paresthesia of skin    Prostate cancer (HCC)    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)    Right upper quadrant pain    Sinusitis, acute    SOB (shortness of breath)    Thrombocythemia    Umbilical hernia    Vitamin D deficiency    Weakness     ALLERGIES:  has no known allergies.  MEDICATIONS:  Current Outpatient Medications  Medication Sig Dispense Refill   amLODipine (NORVASC) 5 MG tablet Take 5 mg by mouth daily.  2   amoxicillin  (AMOXIL ) 500 MG tablet Take 500 mg by mouth 2 (two) times daily.     anagrelide  (AGRYLIN) 1 MG capsule TAKE 1 CAPSULE(1 MG) BY MOUTH DAILY 90 capsule 0   cetirizine (ZYRTEC) 10 MG tablet TAKE 1 TABLET(10 MG) BY MOUTH DAILY     cholestyramine  (QUESTRAN ) 4 g packet Take 1 packet (4 g total) by mouth 2 (two) times daily. 180 packet 3   diphenoxylate-atropine (LOMOTIL) 2.5-0.025 MG tablet Take 1 tablet by mouth 4 (four) times daily as needed.     epoetin  alfa-epbx (RETACRIT ) 40000 UNIT/ML injection Inject into the skin.     gabapentin  (NEURONTIN ) 100 MG capsule TAKE 1 CAPSULE BY MOUTH TWICE DAILY AND 2 CAPSULES BY MOUTH EVERY EVENING 120 capsule 0   hydrocortisone  (ANUSOL -HC) 2.5 % rectal cream Place 1 Application rectally 3 (three) times daily as needed for hemorrhoids or anal itching. 30 g 0   hyoscyamine  (LEVSIN  SL) 0.125 MG SL tablet Take 1 tablet (0.125 mg total) by mouth every 6 (six) hours as needed. 60 tablet 11   lenalidomide (REVLIMID) 5 MG capsule Take 5 mg by mouth daily.     momelotinib dihydrochloride (OJJAARA) 200 MG tablet Take 200 mg by mouth daily. (Patient not taking: Reported on 10/04/2023)     Multiple Vitamin (MULTI-VITAMINS) TABS  Take 1 tablet by mouth daily.     Probiotic Product (ALIGN) 4 MG CAPS Take 1 capsule by mouth daily.     No current facility-administered medications for this visit.   Facility-Administered Medications Ordered in Other Visits  Medication Dose Route Frequency Provider Last Rate Last Admin   0.9 %  sodium chloride  infusion  250 mL Intravenous Once Sherrod Sherrod, MD        SURGICAL HISTORY:  Past Surgical History:  Procedure Laterality Date   COLONOSCOPY     POLYPECTOMY     PROSTATECTOMY     TONSILLECTOMY      REVIEW OF SYSTEMS:  Constitutional: positive for fatigue Eyes: negative Ears, nose, mouth, throat, and face: negative Respiratory: positive for dyspnea on exertion Cardiovascular: negative Gastrointestinal: positive for change in bowel habits Genitourinary:negative Integument/breast: negative Hematologic/lymphatic: negative Musculoskeletal:negative Neurological: positive for weakness Behavioral/Psych:  negative Endocrine: negative Allergic/Immunologic: negative   PHYSICAL EXAMINATION: General appearance: alert, cooperative, appears stated age, fatigued, and no distress Head: Normocephalic, without obvious abnormality, atraumatic Neck: no adenopathy, no JVD, supple, symmetrical, trachea midline, and thyroid not enlarged, symmetric, no tenderness/mass/nodules Lymph nodes: Cervical, supraclavicular, and axillary nodes normal. Resp: clear to auscultation bilaterally Back: symmetric, no curvature. ROM normal. No CVA tenderness. Cardio: regular rate and rhythm, S1, S2 normal, no murmur, click, rub or gallop GI: soft, non-tender; bowel sounds normal; no masses,  no organomegaly Extremities: extremities normal, atraumatic, no cyanosis or edema Neurologic: Alert and oriented X 3, normal strength and tone. Normal symmetric reflexes. Normal coordination and gait  ECOG PERFORMANCE STATUS: 1 - Symptomatic but completely ambulatory  Blood pressure (!) 140/68, pulse 69,  temperature 97.7 F (36.5 C), temperature source Temporal, resp. rate 17, height 5' 9 (1.753 m), weight 149 lb (67.6 kg), SpO2 100%.  LABORATORY DATA:  Lab Results  Component Value Date   WBC 11.8 (H) 11/07/2023   HGB 9.5 (L) 11/07/2023   HCT 29.8 (L) 11/07/2023   MCV 83.9 11/07/2023   PLT 221 11/07/2023      Chemistry      Component Value Date/Time   NA 139 11/07/2023 0904   NA 141 12/08/2016 0928   K 4.7 11/07/2023 0904   K 4.5 12/08/2016 0928   CL 109 11/07/2023 0904   CO2 26 11/07/2023 0904   CO2 27 12/08/2016 0928   BUN 26 (H) 11/07/2023 0904   BUN 14.1 12/08/2016 0928   CREATININE 1.19 11/07/2023 0904   CREATININE 0.9 12/08/2016 0928      Component Value Date/Time   CALCIUM 9.4 11/07/2023 0904   CALCIUM 9.0 12/08/2016 0928   ALKPHOS 57 11/07/2023 0904   ALKPHOS 87 12/08/2016 0928   AST 11 (L) 11/07/2023 0904   AST 25 12/08/2016 0928   ALT 16 11/07/2023 0904   ALT 41 12/08/2016 0928   BILITOT 0.7 11/07/2023 0904   BILITOT 0.84 12/08/2016 0928      RADIOGRAPHIC STUDIES:  ASSESSMENT AND PLAN:  This is a very pleasant 78 years old white male with essential thrombocythemia with positive JAK-2 mutation as well as myelodysplastic syndrome. He is currently on treatment with Revlimid 5 mg by mouth daily according to the recommendation from [facility].  He is also on treatment with Aranesp  every 3 weeks.  Aranesp  was a change it to Procrit  initially every 2 weeks and currently on weekly basis.  He mentioned that he is feeling much better on Procrit  weekly  The patient has been on treatment with Retacrit  40,000 unit weekly for more than 3 years.  He has been tolerating this treatment well. Assessment and Plan Assessment & Plan Myelodysplastic syndrome with refractory anemia and ring sideroblasts Myelodysplastic syndrome with refractory anemia and ring sideroblasts. Current treatment includes Retacrit  injections weekly. Recent change from Revlimid to luspatercept   due to fluctuating white blood cell counts and potential Revlimid side effects, including neuropathy. Luspatercept  approved and administered subcutaneously every three weeks. Hemoglobin level improved to 10.1, the best since December. Luspatercept  expected to benefit those requiring frequent transfusions and with low counts despite growth factors. - Continue weekly Retacrit  injections as needed based on hemoglobin levels. - Administer luspatercept  subcutaneously every three weeks. - Monitor blood counts regularly to assess response to luspatercept . - Evaluate potential for future use of Jakafi to address symptoms and spleen size.  Essential thrombocythemia with JAK2 mutation Essential thrombocythemia with JAK2 mutation. Current management includes anagrelide .  Discussion of potential future use of Jakafi to manage symptoms and improve appetite. - Continue current management with anagrelide . - Consider future use of Jakafi to manage symptoms and improve appetite.  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 Spleen remains enlarged. Potential benefit from Jakafi discussed, which may help reduce spleen size and improve appetite. - Consider Jakafi in the future to address splenomegaly and improve appetite.  Drug-induced peripheral neuropathy Peripheral neuropathy likely induced by Revlimid, presenting as tingling in hands and legs. Discontinuation of Revlimid may alleviate symptoms.  Appetite loss associated with hematologic disease and treatment Appetite loss associated with hematologic disease and treatment, ongoing. Weight stable around 150 pounds with recent minor fluctuation. Potential improvement anticipated with discontinuation of Revlimid and possible future use of Jakafi. - Monitor weight and appetite. - Consider Jakafi in the future to improve appetite. He was advised to call immediately if he has any concerning symptoms in  the interval.   All questions were answered. The patient knows to call the clinic with any problems, questions or concerns. We can certainly see the patient much sooner if necessary.   Disclaimer: This note was dictated with voice recognition software. Similar sounding words can inadvertently be transcribed and may not be corrected upon review.

## 2023-11-15 ENCOUNTER — Other Ambulatory Visit: Payer: Self-pay | Admitting: Physician Assistant

## 2023-11-15 ENCOUNTER — Encounter: Payer: Self-pay | Admitting: Internal Medicine

## 2023-11-15 ENCOUNTER — Telehealth: Payer: Self-pay | Admitting: Internal Medicine

## 2023-11-15 NOTE — Telephone Encounter (Signed)
Scheduled appointments with the patient

## 2023-11-21 ENCOUNTER — Inpatient Hospital Stay

## 2023-11-21 DIAGNOSIS — D461 Refractory anemia with ring sideroblasts: Secondary | ICD-10-CM | POA: Diagnosis not present

## 2023-11-21 DIAGNOSIS — D469 Myelodysplastic syndrome, unspecified: Secondary | ICD-10-CM

## 2023-11-21 LAB — CMP (CANCER CENTER ONLY)
ALT: 12 U/L (ref 0–44)
AST: 12 U/L — ABNORMAL LOW (ref 15–41)
Albumin: 4.8 g/dL (ref 3.5–5.0)
Alkaline Phosphatase: 71 U/L (ref 38–126)
Anion gap: 4 — ABNORMAL LOW (ref 5–15)
BUN: 33 mg/dL — ABNORMAL HIGH (ref 8–23)
CO2: 28 mmol/L (ref 22–32)
Calcium: 9.9 mg/dL (ref 8.9–10.3)
Chloride: 108 mmol/L (ref 98–111)
Creatinine: 1.34 mg/dL — ABNORMAL HIGH (ref 0.61–1.24)
GFR, Estimated: 54 mL/min — ABNORMAL LOW (ref >60)
Glucose, Bld: 93 mg/dL (ref 70–99)
Potassium: 4.8 mmol/L (ref 3.5–5.1)
Sodium: 140 mmol/L (ref 135–145)
Total Bilirubin: 0.8 mg/dL (ref 0.0–1.2)
Total Protein: 7.8 g/dL (ref 6.5–8.1)

## 2023-11-21 LAB — CBC WITH DIFFERENTIAL (CANCER CENTER ONLY)
Abs Immature Granulocytes: 1.21 K/uL — ABNORMAL HIGH (ref 0.00–0.07)
Basophils Absolute: 0.5 K/uL — ABNORMAL HIGH (ref 0.0–0.1)
Basophils Relative: 2 %
Eosinophils Absolute: 1 K/uL — ABNORMAL HIGH (ref 0.0–0.5)
Eosinophils Relative: 5 %
HCT: 37.7 % — ABNORMAL LOW (ref 39.0–52.0)
Hemoglobin: 11.9 g/dL — ABNORMAL LOW (ref 13.0–17.0)
Immature Granulocytes: 6 %
Lymphocytes Relative: 14 %
Lymphs Abs: 3.1 K/uL (ref 0.7–4.0)
MCH: 26.7 pg (ref 26.0–34.0)
MCHC: 31.6 g/dL (ref 30.0–36.0)
MCV: 84.7 fL (ref 80.0–100.0)
Monocytes Absolute: 1 K/uL (ref 0.1–1.0)
Monocytes Relative: 5 %
Neutro Abs: 14.8 K/uL — ABNORMAL HIGH (ref 1.7–7.7)
Neutrophils Relative %: 68 %
Platelet Count: 316 K/uL (ref 150–400)
RBC: 4.45 MIL/uL (ref 4.22–5.81)
RDW: 34.1 % — ABNORMAL HIGH (ref 11.5–15.5)
WBC Count: 21.6 K/uL — ABNORMAL HIGH (ref 4.0–10.5)
nRBC: 0.2 % (ref 0.0–0.2)

## 2023-11-21 NOTE — Progress Notes (Signed)
 Hgb 11.9 today. No retacrit  injection today per Dr. Sherrod. Pt informed, verbalized understanding.

## 2023-11-28 ENCOUNTER — Inpatient Hospital Stay

## 2023-11-28 DIAGNOSIS — C61 Malignant neoplasm of prostate: Secondary | ICD-10-CM

## 2023-11-28 DIAGNOSIS — D461 Refractory anemia with ring sideroblasts: Secondary | ICD-10-CM | POA: Diagnosis not present

## 2023-11-28 DIAGNOSIS — D469 Myelodysplastic syndrome, unspecified: Secondary | ICD-10-CM

## 2023-11-28 LAB — CBC WITH DIFFERENTIAL (CANCER CENTER ONLY)
Abs Immature Granulocytes: 1.12 K/uL — ABNORMAL HIGH (ref 0.00–0.07)
Basophils Absolute: 0.3 K/uL — ABNORMAL HIGH (ref 0.0–0.1)
Basophils Relative: 1 %
Eosinophils Absolute: 1.4 K/uL — ABNORMAL HIGH (ref 0.0–0.5)
Eosinophils Relative: 6 %
HCT: 36.1 % — ABNORMAL LOW (ref 39.0–52.0)
Hemoglobin: 11.3 g/dL — ABNORMAL LOW (ref 13.0–17.0)
Immature Granulocytes: 5 %
Lymphocytes Relative: 13 %
Lymphs Abs: 3 K/uL (ref 0.7–4.0)
MCH: 26.6 pg (ref 26.0–34.0)
MCHC: 31.3 g/dL (ref 30.0–36.0)
MCV: 84.9 fL (ref 80.0–100.0)
Monocytes Absolute: 0.9 K/uL (ref 0.1–1.0)
Monocytes Relative: 4 %
Neutro Abs: 16.9 K/uL — ABNORMAL HIGH (ref 1.7–7.7)
Neutrophils Relative %: 71 %
Platelet Count: 234 K/uL (ref 150–400)
RBC: 4.25 MIL/uL (ref 4.22–5.81)
RDW: 33.3 % — ABNORMAL HIGH (ref 11.5–15.5)
WBC Count: 23.5 K/uL — ABNORMAL HIGH (ref 4.0–10.5)
nRBC: 0.2 % (ref 0.0–0.2)

## 2023-11-28 LAB — CMP (CANCER CENTER ONLY)
ALT: 11 U/L (ref 0–44)
AST: 11 U/L — ABNORMAL LOW (ref 15–41)
Albumin: 4.6 g/dL (ref 3.5–5.0)
Alkaline Phosphatase: 73 U/L (ref 38–126)
Anion gap: 5 (ref 5–15)
BUN: 32 mg/dL — ABNORMAL HIGH (ref 8–23)
CO2: 27 mmol/L (ref 22–32)
Calcium: 9.5 mg/dL (ref 8.9–10.3)
Chloride: 107 mmol/L (ref 98–111)
Creatinine: 1.36 mg/dL — ABNORMAL HIGH (ref 0.61–1.24)
GFR, Estimated: 53 mL/min — ABNORMAL LOW (ref >60)
Glucose, Bld: 91 mg/dL (ref 70–99)
Potassium: 4.8 mmol/L (ref 3.5–5.1)
Sodium: 139 mmol/L (ref 135–145)
Total Bilirubin: 0.5 mg/dL (ref 0.0–1.2)
Total Protein: 7.5 g/dL (ref 6.5–8.1)

## 2023-11-28 LAB — IRON AND IRON BINDING CAPACITY (CC-WL,HP ONLY)
Iron: 154 ug/dL (ref 45–182)
Saturation Ratios: 59 % — ABNORMAL HIGH (ref 17.9–39.5)
TIBC: 263 ug/dL (ref 250–450)
UIBC: 109 ug/dL — ABNORMAL LOW (ref 117–376)

## 2023-11-28 LAB — FOLATE: Folate: 7.8 ng/mL (ref >5.9)

## 2023-11-28 LAB — LACTATE DEHYDROGENASE: LDH: 283 U/L — ABNORMAL HIGH (ref 98–192)

## 2023-11-28 LAB — VITAMIN B12: Vitamin B-12: 988 pg/mL — ABNORMAL HIGH (ref 180–914)

## 2023-11-28 MED ORDER — EPOETIN ALFA-EPBX 40000 UNIT/ML IJ SOLN: 40000.0000 [IU] | Freq: Once | INTRAMUSCULAR | Status: DC

## 2023-12-01 ENCOUNTER — Emergency Department (HOSPITAL_COMMUNITY)

## 2023-12-01 ENCOUNTER — Encounter (HOSPITAL_COMMUNITY): Payer: Self-pay | Admitting: *Deleted

## 2023-12-01 ENCOUNTER — Inpatient Hospital Stay (HOSPITAL_COMMUNITY)
Admission: EM | Admit: 2023-12-01 | Discharge: 2023-12-04 | DRG: 419 | Disposition: A | Discharge status: Home / Self Care | Source: Ambulatory Visit | Attending: Internal Medicine | Admitting: Internal Medicine

## 2023-12-01 ENCOUNTER — Other Ambulatory Visit: Payer: Self-pay

## 2023-12-01 DIAGNOSIS — K810 Acute cholecystitis: Secondary | ICD-10-CM | POA: Diagnosis not present

## 2023-12-01 DIAGNOSIS — Z8042 Family history of malignant neoplasm of prostate: Secondary | ICD-10-CM

## 2023-12-01 DIAGNOSIS — Z66 Do not resuscitate: Secondary | ICD-10-CM | POA: Diagnosis present

## 2023-12-01 DIAGNOSIS — I10 Essential (primary) hypertension: Secondary | ICD-10-CM | POA: Diagnosis present

## 2023-12-01 DIAGNOSIS — Z9079 Acquired absence of other genital organ(s): Secondary | ICD-10-CM

## 2023-12-01 DIAGNOSIS — E785 Hyperlipidemia, unspecified: Secondary | ICD-10-CM | POA: Diagnosis present

## 2023-12-01 DIAGNOSIS — Z8249 Family history of ischemic heart disease and other diseases of the circulatory system: Secondary | ICD-10-CM

## 2023-12-01 DIAGNOSIS — K819 Cholecystitis, unspecified: Secondary | ICD-10-CM | POA: Diagnosis present

## 2023-12-01 DIAGNOSIS — D469 Myelodysplastic syndrome, unspecified: Secondary | ICD-10-CM | POA: Diagnosis present

## 2023-12-01 DIAGNOSIS — D72829 Elevated white blood cell count, unspecified: Secondary | ICD-10-CM | POA: Diagnosis present

## 2023-12-01 DIAGNOSIS — G4700 Insomnia, unspecified: Secondary | ICD-10-CM | POA: Diagnosis present

## 2023-12-01 DIAGNOSIS — K8012 Calculus of gallbladder with acute and chronic cholecystitis without obstruction: Principal | ICD-10-CM | POA: Diagnosis present

## 2023-12-01 DIAGNOSIS — E875 Hyperkalemia: Secondary | ICD-10-CM | POA: Diagnosis present

## 2023-12-01 DIAGNOSIS — D473 Essential (hemorrhagic) thrombocythemia: Secondary | ICD-10-CM | POA: Diagnosis present

## 2023-12-01 DIAGNOSIS — Z801 Family history of malignant neoplasm of trachea, bronchus and lung: Secondary | ICD-10-CM

## 2023-12-01 DIAGNOSIS — Z8546 Personal history of malignant neoplasm of prostate: Secondary | ICD-10-CM

## 2023-12-01 DIAGNOSIS — Z87891 Personal history of nicotine dependence: Secondary | ICD-10-CM

## 2023-12-01 DIAGNOSIS — Z808 Family history of malignant neoplasm of other organs or systems: Secondary | ICD-10-CM

## 2023-12-01 DIAGNOSIS — Z803 Family history of malignant neoplasm of breast: Secondary | ICD-10-CM

## 2023-12-01 DIAGNOSIS — K589 Irritable bowel syndrome without diarrhea: Secondary | ICD-10-CM | POA: Diagnosis present

## 2023-12-01 DIAGNOSIS — H9190 Unspecified hearing loss, unspecified ear: Secondary | ICD-10-CM | POA: Diagnosis present

## 2023-12-01 DIAGNOSIS — Z79899 Other long term (current) drug therapy: Secondary | ICD-10-CM

## 2023-12-01 DIAGNOSIS — K219 Gastro-esophageal reflux disease without esophagitis: Secondary | ICD-10-CM | POA: Diagnosis present

## 2023-12-01 LAB — CBC
HCT: 39.1 % (ref 39.0–52.0)
Hemoglobin: 12.1 g/dL — ABNORMAL LOW (ref 13.0–17.0)
MCH: 26.2 pg (ref 26.0–34.0)
MCHC: 30.9 g/dL (ref 30.0–36.0)
MCV: 84.8 fL (ref 80.0–100.0)
Platelets: 275 K/uL (ref 150–400)
RBC: 4.61 MIL/uL (ref 4.22–5.81)
RDW: 34.4 % — ABNORMAL HIGH (ref 11.5–15.5)
WBC: 33.9 K/uL — ABNORMAL HIGH (ref 4.0–10.5)
nRBC: 0.1 % (ref 0.0–0.2)

## 2023-12-01 LAB — URINALYSIS, ROUTINE W REFLEX MICROSCOPIC
Bacteria, UA: NONE SEEN
Bilirubin Urine: NEGATIVE
Glucose, UA: NEGATIVE mg/dL
Hgb urine dipstick: NEGATIVE
Ketones, ur: NEGATIVE mg/dL
Leukocytes,Ua: NEGATIVE
Nitrite: NEGATIVE
Protein, ur: NEGATIVE mg/dL
Specific Gravity, Urine: 1.004 — ABNORMAL LOW (ref 1.005–1.030)
pH: 7 (ref 5.0–8.0)

## 2023-12-01 LAB — LIPASE, BLOOD: Lipase: 39 U/L (ref 11–51)

## 2023-12-01 LAB — COMPREHENSIVE METABOLIC PANEL WITH GFR
ALT: 584 U/L — ABNORMAL HIGH (ref 0–44)
AST: 458 U/L — ABNORMAL HIGH (ref 15–41)
Albumin: 4.5 g/dL (ref 3.5–5.0)
Alkaline Phosphatase: 124 U/L (ref 38–126)
Anion gap: 9 (ref 5–15)
BUN: 25 mg/dL — ABNORMAL HIGH (ref 8–23)
CO2: 24 mmol/L (ref 22–32)
Calcium: 10.2 mg/dL (ref 8.9–10.3)
Chloride: 105 mmol/L (ref 98–111)
Creatinine, Ser: 1.24 mg/dL (ref 0.61–1.24)
GFR, Estimated: 60 mL/min — ABNORMAL LOW (ref >60)
Glucose, Bld: 112 mg/dL — ABNORMAL HIGH (ref 70–99)
Potassium: 5.5 mmol/L — ABNORMAL HIGH (ref 3.5–5.1)
Sodium: 138 mmol/L (ref 135–145)
Total Bilirubin: 3.5 mg/dL — ABNORMAL HIGH (ref 0.0–1.2)
Total Protein: 8.2 g/dL — ABNORMAL HIGH (ref 6.5–8.1)

## 2023-12-01 LAB — SURGICAL PCR SCREEN
MRSA, PCR: NEGATIVE
Staphylococcus aureus: NEGATIVE

## 2023-12-01 LAB — LACTIC ACID, PLASMA: Lactic Acid, Venous: 1 mmol/L (ref 0.5–1.9)

## 2023-12-01 MED ORDER — METRONIDAZOLE 500 MG PO TABS
500.0000 mg | ORAL_TABLET | Freq: Two times a day (BID) | ORAL | Status: DC
  Administered 2023-12-02 (×2): 500 mg via ORAL
  Filled 2023-12-01 (×2): qty 1

## 2023-12-01 MED ORDER — CALCIUM GLUCONATE-NACL 1-0.675 GM/50ML-% IV SOLN
1.0000 g | Freq: Once | INTRAVENOUS | Status: AC
  Administered 2023-12-01: 1000 mg via INTRAVENOUS
  Filled 2023-12-01: qty 50

## 2023-12-01 MED ORDER — SODIUM CHLORIDE 0.9 % IV SOLN
2.0000 g | Freq: Once | INTRAVENOUS | Status: AC
  Administered 2023-12-01: 2 g via INTRAVENOUS
  Filled 2023-12-01: qty 12.5

## 2023-12-01 MED ORDER — ENOXAPARIN SODIUM 40 MG/0.4ML IJ SOSY
40.0000 mg | PREFILLED_SYRINGE | INTRAMUSCULAR | Status: DC
  Administered 2023-12-01: 40 mg via SUBCUTANEOUS
  Filled 2023-12-01: qty 0.4

## 2023-12-01 MED ORDER — MUPIROCIN 2 % EX OINT
1.0000 | TOPICAL_OINTMENT | Freq: Two times a day (BID) | CUTANEOUS | Status: DC
  Administered 2023-12-03 – 2023-12-04 (×2): 1 via NASAL
  Filled 2023-12-01: qty 22

## 2023-12-01 MED ORDER — OXYCODONE HCL 5 MG PO TABS
5.0000 mg | ORAL_TABLET | ORAL | Status: DC | PRN
  Administered 2023-12-02: 5 mg via ORAL
  Filled 2023-12-01: qty 1

## 2023-12-01 MED ORDER — AMLODIPINE BESYLATE 5 MG PO TABS
5.0000 mg | ORAL_TABLET | Freq: Every day | ORAL | Status: DC
  Administered 2023-12-02 – 2023-12-04 (×3): 5 mg via ORAL
  Filled 2023-12-01 (×3): qty 1

## 2023-12-01 MED ORDER — LACTATED RINGERS IV SOLN: INTRAVENOUS | Status: AC

## 2023-12-01 MED ORDER — LACTATED RINGERS IV BOLUS
1000.0000 mL | Freq: Once | INTRAVENOUS | Status: AC
  Administered 2023-12-01: 1000 mL via INTRAVENOUS

## 2023-12-01 MED ORDER — GABAPENTIN 100 MG PO CAPS
200.0000 mg | ORAL_CAPSULE | Freq: Every day | ORAL | Status: DC
  Administered 2023-12-02 – 2023-12-03 (×2): 200 mg via ORAL
  Filled 2023-12-01 (×3): qty 2

## 2023-12-01 MED ORDER — SODIUM ZIRCONIUM CYCLOSILICATE 10 G PO PACK
10.0000 g | PACK | Freq: Once | ORAL | Status: AC
  Administered 2023-12-01: 10 g via ORAL
  Filled 2023-12-01: qty 1

## 2023-12-01 MED ORDER — METRONIDAZOLE 500 MG/100ML IV SOLN
500.0000 mg | Freq: Once | INTRAVENOUS | Status: AC
  Administered 2023-12-01: 500 mg via INTRAVENOUS
  Filled 2023-12-01: qty 100

## 2023-12-01 MED ORDER — ONDANSETRON HCL 4 MG/2ML IJ SOLN
4.0000 mg | Freq: Four times a day (QID) | INTRAMUSCULAR | Status: DC | PRN
  Administered 2023-12-02 (×2): 4 mg via INTRAVENOUS
  Filled 2023-12-01 (×3): qty 2

## 2023-12-01 MED ORDER — ANAGRELIDE HCL 0.5 MG PO CAPS
1.0000 mg | ORAL_CAPSULE | Freq: Every day | ORAL | Status: DC
  Filled 2023-12-01 (×3): qty 2

## 2023-12-01 MED ORDER — SODIUM CHLORIDE 0.9 % IV SOLN
2.0000 g | Freq: Two times a day (BID) | INTRAVENOUS | Status: DC
  Administered 2023-12-02: 2 g via INTRAVENOUS
  Filled 2023-12-01: qty 12.5

## 2023-12-01 MED ORDER — CHOLESTYRAMINE 4 G PO PACK
4.0000 g | PACK | Freq: Two times a day (BID) | ORAL | Status: DC
  Administered 2023-12-01: 4 g via ORAL
  Filled 2023-12-01 (×2): qty 1

## 2023-12-01 MED ORDER — ONDANSETRON HCL 4 MG PO TABS: 4.0000 mg | ORAL_TABLET | Freq: Four times a day (QID) | ORAL | Status: DC | PRN

## 2023-12-01 NOTE — ED Provider Notes (Signed)
 [place] EMERGENCY DEPARTMENT AT [facility] Provider Note   CSN: [id number] Arrival date & time: 12/01/23  1232     Patient presents with: Abdominal Pain   Roy Johnson is a 78 y.o. male.    Abdominal Pain    Patient has a history of cholelithiasis IBS prostate cancer thrombocytosis thrombocytopenia myelodysplastic syndrome.  Patient started having discomfort in his upper abdomen.  Its mostly in the right upper quadrant.  It gets worse when he is lying flat.  The pain migrates into his chest.  Patient states he is nauseated and has had decreased appetite.  No vomiting.  No urinary symptoms.  Patient went to his primary doctor's office and had laboratory testing that showed abnormal liver enzymes.  Patient was told he need to come to the ED to have his gallbladder removed.  Prior to Admission medications   Medication Sig Start Date End Date Taking? Authorizing Provider  amLODipine  (NORVASC ) 5 MG tablet Take 5 mg by mouth daily. 08/30/17   [provider]  amoxicillin  (AMOXIL ) 500 MG tablet Take 500 mg by mouth 2 (two) times daily. 03/16/23   [provider]  anagrelide  (AGRYLIN) 1 MG capsule TAKE 1 CAPSULE(1 MG) BY MOUTH DAILY 08/09/19   Sherrod Sherrod, MD  cetirizine (ZYRTEC) 10 MG tablet TAKE 1 TABLET(10 MG) BY MOUTH DAILY 07/29/19   [provider]  cholestyramine  (QUESTRAN ) 4 g packet Take 1 packet (4 g total) by mouth 2 (two) times daily. 10/04/23   Abran Norleen SAILOR, MD  diphenoxylate-atropine (LOMOTIL) 2.5-0.025 MG tablet Take 1 tablet by mouth 4 (four) times daily as needed. 09/16/21   [provider]  epoetin  alfa-epbx (RETACRIT ) 40000 UNIT/ML injection Inject into the skin.    [provider]  gabapentin  (NEURONTIN ) 100 MG capsule TAKE 1 CAPSULE BY MOUTH TWICE DAILY AND 2 CAPSULES BY MOUTH EVERY EVENING 10/31/23   Onita Duos, MD  hydrocortisone  (ANUSOL -HC) 2.5 % rectal cream Place 1 Application rectally 3 (three) times daily as  needed for hemorrhoids or anal itching. 02/23/22   Zehr, Jessica D, PA-C  hyoscyamine  (LEVSIN  SL) 0.125 MG SL tablet Take 1 tablet (0.125 mg total) by mouth every 6 (six) hours as needed. 10/04/23   Abran Norleen SAILOR, MD  lenalidomide (REVLIMID) 5 MG capsule Take 5 mg by mouth daily.    [provider]  momelotinib dihydrochloride (OJJAARA) 200 MG tablet Take 200 mg by mouth daily. Patient not taking: Reported on 10/04/2023 03/23/23   [provider]  Multiple Vitamin (MULTI-VITAMINS) TABS Take 1 tablet by mouth daily.    [provider]  Probiotic Product (ALIGN) 4 MG CAPS Take 1 capsule by mouth daily.    [provider]    Allergies: Patient has no known allergies.    Review of Systems  Gastrointestinal:  Positive for abdominal pain.    Updated Vital Signs BP (!) 153/77   Pulse 74   Temp 98 F (36.7 C) (Oral)   Resp 18   Ht 1.727 m (5' 8)   Wt 67.6 kg   SpO2 100%   BMI 22.66 kg/m   Physical Exam Vitals and nursing note reviewed.  Constitutional:      General: He is not in acute distress.    Appearance: He is well-developed.  HENT:     Head: Normocephalic and atraumatic.     Right Ear: External ear normal.     Left Ear: External ear normal.  Eyes:     General:  No scleral icterus.       Right eye: No discharge.        Left eye: No discharge.     Conjunctiva/sclera: Conjunctivae normal.  Neck:     Trachea: No tracheal deviation.  Cardiovascular:     Rate and Rhythm: Normal rate and regular rhythm.  Pulmonary:     Effort: Pulmonary effort is normal. No respiratory distress.     Breath sounds: Normal breath sounds. No stridor. No wheezing or rales.  Abdominal:     General: Bowel sounds are normal. There is no distension.     Palpations: Abdomen is soft.     Tenderness: There is abdominal tenderness in the right upper quadrant. There is no guarding or rebound.  Musculoskeletal:        General: No tenderness or deformity.     Cervical  back: Neck supple.  Skin:    General: Skin is warm and dry.     Findings: No rash.  Neurological:     General: No focal deficit present.     Mental Status: He is alert.     Cranial Nerves: No cranial nerve deficit, dysarthria or facial asymmetry.     Sensory: No sensory deficit.     Motor: No abnormal muscle tone or seizure activity.     Coordination: Coordination normal.  Psychiatric:        Mood and Affect: Mood normal.     (all labs ordered are listed, but only abnormal results are displayed) Labs Reviewed  COMPREHENSIVE METABOLIC PANEL WITH GFR - Abnormal; Notable for the following components:      Result Value   Potassium 5.5 (*)    Glucose, Bld 112 (*)    BUN 25 (*)    Total Protein 8.2 (*)    AST 458 (*)    ALT 584 (*)    Total Bilirubin 3.5 (*)    GFR, Estimated 60 (*)    All other components within normal limits  CBC - Abnormal; Notable for the following components:   WBC 33.9 (*)    Hemoglobin 12.1 (*)    RDW 34.4 (*)    All other components within normal limits  LIPASE, BLOOD  LACTIC ACID, PLASMA  URINALYSIS, ROUTINE W REFLEX MICROSCOPIC    EKG: None  Radiology: US  Abdomen Limited RUQ (LIVER/GB) Result Date: 12/01/2023 CLINICAL DATA:  Right upper quadrant pain. EXAM: ULTRASOUND ABDOMEN LIMITED RIGHT UPPER QUADRANT COMPARISON:  December 09, 2022 FINDINGS: Gallbladder: Multiple shadowing echogenic gallstones are seen within the gallbladder lumen. The largest measures approximately 1.7 cm. Cholesterol polyps are also suspected within the gallbladder lumen. The gallbladder wall is thickened (5.2 mm) and edematous. A positive sonographic Beverley sign is noted by sonographer. Common bile duct: Diameter: 3.9 mm Liver: No focal lesion identified. Within normal limits in parenchymal echogenicity. Portal vein is patent on color Doppler imaging with normal direction of blood flow towards the liver. Other: None. IMPRESSION: 1. Cholelithiasis with additional findings consistent  with acute cholecystitis. 2. Cholesterol polyps suspected within the gallbladder lumen. Electronically Signed   By: Suzen Dials M.D.   On: 12/01/2023 16:52     Procedures   Medications Ordered in the ED  ceFEPIme  (MAXIPIME ) 2 g in sodium chloride  0.9 % 100 mL IVPB (has no administration in time range)    And  metroNIDAZOLE  (FLAGYL ) IVPB 500 mg (has no administration in time range)  lactated ringers  bolus 1,000 mL (1,000 mLs Intravenous New Bag/Given 12/01/23 1622)    Clinical  Course as of 12/02/23 9075  Fri Dec 01, 2023  1517 CBC(!) Leukocytosis noted increasing compared to previous.  LFTs are elevated with increased bilirubin [JK]  1655 US  shows acute cholecystitis. [JK]  1757 Case reviewed with Dr. Paola.  Will keep patient n.p.o. for now.  I have ordered IV antibiotics.  I will consult the medical service for admission [JK]    Clinical Course User Index [JK] Randol Simmonds, MD                                 Medical Decision Making Problems Addressed: Acute cholecystitis: acute illness or injury that poses a threat to life or bodily functions Myelodysplastic syndrome [facility]): acute illness or injury that poses a threat to life or bodily functions  Amount and/or Complexity of Data Reviewed Labs: ordered. Decision-making details documented in ED Course. Radiology: ordered and independent interpretation performed.  Risk Prescription drug management. Decision regarding hospitalization.   Patient presented to the ED for evaluation of upper abdominal pain.  Patient does have history of cholelithiasis.SABRA  He saw his primary care doctor and was noted to have elevated LFTs.  Sent to the ED for further evaluation.  In the ED patient does have significant leukocytosis but he does have history of myelodysplastic syndrome.  White count however is more elevated than previous.  No signs of lactic acidosis and hemodynamically stable at this time.  His labs do show elevated elevated LFT's  and elevated bilirubin.  Ultrasound does not show dilated common bile duct.  Does show evidence of acute cholecystitis.  I have consulted with general surgery Dr. Paola.  Will plan on surgical treatment.  I have ordered IV antibiotics.  I will consult with the medical service for admission.    Final diagnoses:  Acute cholecystitis  Myelodysplastic syndrome [facility])    ED Discharge Orders     None          Randol Simmonds, MD 12/02/23 [phone]

## 2023-12-01 NOTE — ED Notes (Signed)
 Patient transported to Ultrasound

## 2023-12-01 NOTE — H&P (Addendum)
 History and Physical    Patient: Roy Johnson FMW:[id number] DOB: Apr 08, 1946 DOA: 12/01/2023 DOS: the patient was seen and examined on 12/01/2023 PCP: [name], MD  Patient coming from: Home  Chief Complaint:  Chief Complaint  Patient presents with   Abdominal Pain   HPI: Roy Johnson is a 78 y.o. male with a history of MDS, essential thrombocythemia, chronic diarrhea, prostate CA who presented to the ED with upper abdominal pain focused in RUQ radiating to the chest, constant, waxing/waning associated with nausea and poor appetite. He had presented to his PCP where LFTs were found to be elevated, so he was referred to the ED. In the ED, U/S confirmed findings suggestive of acute cholecystitis. Antibiotics given, surgery consulted, and medicine admission requested.    Review of Systems: As mentioned in the history of present illness. All other systems reviewed and are negative. Past Medical History:  Diagnosis Date   Anemia    Anesthesia of skin    Calculus of gallbladder with acute cholecystitis, with obstruction    Cellulitis of back    Chest pain    Chronic kidney disease (CKD), stage III (moderate) (HCC)    Colon polyp    Cough    Disorientation    Diverticulitis    Diverticulosis    Dyspnea on exertion    Elevated white blood cell count    Fatigue    Forgetfulness    GERD (gastroesophageal reflux disease)    Hearing loss    HTN (hypertension)    Hydrocele    Hyperkalemia    Hyperlipidemia    Hypertensive chronic kidney disease with stage 1 through stage 4 chronic kidney disease, or unspecified chronic kidney disease    Hypokalemia    IBS (irritable bowel syndrome)    Impacted cerumen, bilateral    Inguinal hernia    Insomnia    Lightheadedness    Liver disease    Lower abdominal pain    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   Mixed stress and urge urinary incontinence    Myelodysplastic syndrome (HCC)    Nausea with vomiting    Numbness and tingling in both  hands    Other amnesia    Other benign neoplasm of skin, unspecified    Overflow incontinence of urine    Pain in right leg    Paresthesia of skin    Prostate cancer (HCC)    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 His smoking use included cigarettes. He has never used smokeless tobacco. He reports current alcohol use. He reports that he does not use drugs.  No Known Allergies  Family History  Problem Relation Age of Onset   Osteoarthritis Mother    Breast cancer Mother    Heart disease Father    Colon polyps Father    Lung cancer Father        cancerous mole   Prostate cancer Father    Colon cancer Neg Hx    Esophageal cancer Neg Hx    Rectal  cancer Neg Hx    Stomach cancer Neg Hx    Pancreatic cancer Neg Hx     Prior to Admission medications   Medication Sig Start Date End Date Taking? Authorizing Provider  amLODipine  (NORVASC ) 5 MG tablet Take 5 mg by mouth daily. 08/30/17   [provider]  amoxicillin  (AMOXIL ) 500 MG tablet Take 500 mg by mouth 2 (two) times daily. 03/16/23   [provider]  anagrelide  (AGRYLIN) 1 MG capsule TAKE 1 CAPSULE(1 MG) BY MOUTH DAILY 08/09/19   Sherrod Sherrod, MD  cetirizine (ZYRTEC) 10 MG tablet TAKE 1 TABLET(10 MG) BY MOUTH DAILY 07/29/19   [provider]  cholestyramine  (QUESTRAN ) 4 g packet Take 1 packet (4 g total) by mouth 2 (two) times daily. 10/04/23   Abran Norleen SAILOR, MD  diphenoxylate-atropine (LOMOTIL) 2.5-0.025 MG tablet Take 1 tablet by mouth 4 (four) times daily as needed. 09/16/21   [provider]  epoetin  alfa-epbx (RETACRIT ) 40000  UNIT/ML injection Inject into the skin.    [provider]  gabapentin  (NEURONTIN ) 100 MG capsule TAKE 1 CAPSULE BY MOUTH TWICE DAILY AND 2 CAPSULES BY MOUTH EVERY EVENING 10/31/23   Onita Duos, MD  hydrocortisone  (ANUSOL -HC) 2.5 % rectal cream Place 1 Application rectally 3 (three) times daily as needed for hemorrhoids or anal itching. 02/23/22   Zehr, Jessica D, PA-C  hyoscyamine  (LEVSIN  SL) 0.125 MG SL tablet Take 1 tablet (0.125 mg total) by mouth every 6 (six) hours as needed. 10/04/23   Abran Norleen SAILOR, MD  lenalidomide (REVLIMID) 5 MG capsule Take 5 mg by mouth daily.    [provider]  momelotinib dihydrochloride (OJJAARA) 200 MG tablet Take 200 mg by mouth daily. Patient not taking: Reported on 10/04/2023 03/23/23   [provider]  Multiple Vitamin (MULTI-VITAMINS) TABS Take 1 tablet by mouth daily.    [provider]  Probiotic Product (ALIGN) 4 MG CAPS Take 1 capsule by mouth daily.    [provider]    Physical Exam: Vitals:   12/01/23 1244 12/01/23 1459 12/01/23 1800 12/01/23 2000  BP:  (!) 153/77 123/80 (!) 162/78  Pulse:  74 69 85  Resp:  18 18 18   Temp:  98 F (36.7 C) 98 F (36.7 C) 98.2 F (36.8 C)  TempSrc:  Oral Oral Oral  SpO2:  100% 100% 99%  Weight: 67.6 kg     Height: 5' 8 (1.727 m)     Gen: No distress, elderly and lively male Pulm: Clear,nonlabored  CV: RRR, no MRG or edema, no JVD GI: Soft, modestly tender in RUQ, ND, +BS Neuro: Alert and oriented. No new focal deficits. Ext: Warm, no deformities. Skin: No jaundice, rashes, lesions or ulcers on visualized skin   Data Reviewed: K 5.5 SCr 1.24, BUN 25 Abd U/S: +cholecystitis w/gallbladder polyp and calculi.  Assessment and Plan: Acute calculous cholecystitis: CBD appears normal on U/S.  - Surgery consulted. CLD tonight, NPO p MN for laparoscopic cholecystectomy 8/23 with Dr. Stevie.  - Continue Cefepime /flagyl  pending formal surgery recommendations. WBC  33.9k, elevated from his elevated normal.  - Trend LFTs.  - Has no known significant cardiac history, normal echo in 2023, no physical limitations exertionally.   Myelodysplastic syndrome, JAK2-positive essential thrombocythemia, chronic anemia with history of transfusions:  - Added Dr. Sherrod to Tx team as FYI.  - Continue anagrelide . Not on revlimid currently.  - Is due for ESA q3 weeks on 8/26. Consents to transfusion if needed, has tolerated well  in the past.  - Continue routine VTE ppx, hgb 12.1g/dl.   Hyperkalemia: SCr stable-to-improved. ?dietary. ?hemolysis.  - Lokelma , calcium  gluconate, recheck in AM.   DNR: POA. Discussed with pt/daughter at bedside. No limitations on treatment desired unless case of cardiac/respiratory arrest.    Advance Care Planning: DNR  Consults: Surgery, Dr. Paola  Family Communication: Daughter (who is RN) at bedside  Severity of Illness: The appropriate patient status for this patient is OBSERVATION. Observation status is judged to be reasonable and necessary in order to provide the required intensity of service to ensure the patient's safety. The patient's presenting symptoms, physical exam findings, and initial radiographic and laboratory data in the context of their medical condition is felt to place them at decreased risk for further clinical deterioration. Furthermore, it is anticipated that the patient will be medically stable for discharge from the hospital within 2 midnights of admission.   Author: Bernardino KATHEE Come, MD 12/01/2023 8:48 PM  For on call review [email or web address].

## 2023-12-01 NOTE — Consult Note (Signed)
 Reason for Consult/Chief Complaint: hyperbilirubinemia, cholecystitis Consultant: Randol, MD  Roy Johnson is an 78 y.o. male.   HPI: 58M who presents after undergoing labwork by his PCP which was notable for elevated LFTs. Also reports infrasternal abdominal pain just right of midline. Associated nausea without vomiting. In the ED, he underwent US  notable for cholecystitis, gallstones, and CBD 3.63mm. LFTs in the 4-500s and Tbili 3.5. Prior history of prostatectomy via lower midline incision. Daughter at bedside is a Engineer, civil (consulting) at [employer] in the cancer center.   Medical history notable for MDS, thrombocythemia, and chronic diarrhea.   Past Medical History:  Diagnosis Date   Anemia    Anesthesia of skin    Calculus of gallbladder with acute cholecystitis, with obstruction    Cellulitis of back    Chest pain    Chronic kidney disease (CKD), stage III (moderate) (HCC)    Colon polyp    Cough    Disorientation    Diverticulitis    Diverticulosis    Dyspnea on exertion    Elevated white blood cell count    Fatigue    Forgetfulness    GERD (gastroesophageal reflux disease)    Hearing loss    HTN (hypertension)    Hydrocele    Hyperkalemia    Hyperlipidemia    Hypertensive chronic kidney disease with stage 1 through stage 4 chronic kidney disease, or unspecified chronic kidney disease    Hypokalemia    IBS (irritable bowel syndrome)    Impacted cerumen, bilateral    Inguinal hernia    Insomnia    Lightheadedness    Liver disease    Lower abdominal pain    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   Mixed stress and urge urinary incontinence    Myelodysplastic syndrome (HCC)    Nausea with vomiting    Numbness and tingling in both hands    Other amnesia    Other benign neoplasm of skin, unspecified    Overflow incontinence of urine    Pain in right leg    Paresthesia of skin    Prostate cancer (HCC)    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)    Right upper quadrant pain     Sinusitis, acute    SOB (shortness of breath)    Thrombocythemia    Umbilical hernia    Vitamin D deficiency    Weakness     Past Surgical History:  Procedure Laterality Date   COLONOSCOPY     POLYPECTOMY     PROSTATECTOMY     TONSILLECTOMY      Family History  Problem Relation Age of Onset   Osteoarthritis Mother    Breast cancer Mother    Heart disease Father    Colon polyps Father    Lung cancer Father        cancerous mole   Prostate cancer Father    Colon cancer Neg Hx    Esophageal cancer Neg Hx    Rectal cancer Neg Hx    Stomach cancer Neg Hx    Pancreatic cancer Neg Hx     Social History:  reports that he quit smoking about 50 years ago. His smoking use included cigarettes. He has never used smokeless tobacco. He reports current alcohol use. He reports that he does not use drugs.  Allergies: No Known Allergies  Medications: I have reviewed the patient's current medications.  Results for orders placed or performed during the hospital encounter of 12/01/23 (from the past 48  hours)  Lipase, blood     Status: None   Collection Time: 12/01/23 12:55 PM  Result Value Ref Range   Lipase 39 11 - 51 U/L    Comment: Performed at [facility], [address], [place], [place] [address]  Comprehensive metabolic panel     Status: Abnormal   Collection Time: 12/01/23 12:55 PM  Result Value Ref Range   Sodium 138 135 - 145 mmol/L   Potassium 5.5 (H) 3.5 - 5.1 mmol/L   Chloride 105 98 - 111 mmol/L   CO2 24 22 - 32 mmol/L   Glucose, Bld 112 (H) 70 - 99 mg/dL    Comment: Glucose reference range applies only to samples taken after fasting for at least 8 hours.   BUN 25 (H) 8 - 23 mg/dL   Creatinine, Ser 8.75 0.61 - 1.24 mg/dL   Calcium  10.2 8.9 - 10.3 mg/dL   Total Protein 8.2 (H) 6.5 - 8.1 g/dL   Albumin 4.5 3.5 - 5.0 g/dL   AST 541 (H) 15 - 41 U/L   ALT 584 (H) 0 - 44 U/L   Alkaline Phosphatase 124 38 - 126 U/L   Total Bilirubin 3.5 (H) 0.0 - 1.2 mg/dL   GFR,  Estimated 60 (L) >60 mL/min    Comment: (NOTE) Calculated using the CKD-EPI Creatinine Equation (2021)    Anion gap 9 5 - 15    Comment: Performed at [facility], [address], [place], [place] [address]  CBC     Status: Abnormal   Collection Time: 12/01/23 12:55 PM  Result Value Ref Range   WBC 33.9 (H) 4.0 - 10.5 K/uL   RBC 4.61 4.22 - 5.81 MIL/uL   Hemoglobin 12.1 (L) 13.0 - 17.0 g/dL   HCT 60.8 60.9 - 47.9 %   MCV 84.8 80.0 - 100.0 fL    Comment: REPEATED TO VERIFY   MCH 26.2 26.0 - 34.0 pg   MCHC 30.9 30.0 - 36.0 g/dL    Comment: REPEATED TO VERIFY   RDW 34.4 (H) 11.5 - 15.5 %   Platelets 275 150 - 400 K/uL    Comment: REPEATED TO VERIFY   nRBC 0.1 0.0 - 0.2 %    Comment: Performed at [facility], [address] [place], [place] [address]  Lactic acid, plasma     Status: None   Collection Time: 12/01/23  3:28 PM  Result Value Ref Range   Lactic Acid, Venous 1.0 0.5 - 1.9 mmol/L    Comment: Performed at [facility], [address], [place], [place] [address]   *Note: Due to a large number of results and/or encounters for the requested time period, some results have not been displayed. A complete set of results can be found in Results Review.    US  Abdomen Limited RUQ (LIVER/GB) Result Date: 12/01/2023 CLINICAL DATA:  Right upper quadrant pain. EXAM: ULTRASOUND ABDOMEN LIMITED RIGHT UPPER QUADRANT COMPARISON:  December 09, 2022 FINDINGS: Gallbladder: Multiple shadowing echogenic gallstones are seen within the gallbladder lumen. The largest measures approximately 1.7 cm. Cholesterol polyps are also suspected within the gallbladder lumen. The gallbladder wall is thickened (5.2 mm) and edematous. A positive sonographic Beverley sign is noted by sonographer. Common bile duct: Diameter: 3.9 mm Liver: No focal lesion identified. Within normal limits in parenchymal echogenicity. Portal vein is patent on color Doppler imaging with normal direction of blood flow towards  the liver. Other: None. IMPRESSION: 1. Cholelithiasis with  additional findings consistent with acute cholecystitis. 2. Cholesterol polyps suspected within the gallbladder lumen. Electronically Signed   By: Suzen Dials M.D.   On: 12/01/2023 16:52    ROS 10 point review of systems is negative except as listed above in HPI.   Physical Exam Blood pressure (!) 153/77, pulse 74, temperature 98 F (36.7 C), temperature source Oral, resp. rate 18, height 5' 8 (1.727 m), weight 67.6 kg, SpO2 100%. Constitutional: well-developed, well-nourished HEENT: pupils equal, round, reactive to light, 2mm b/l, moist conjunctiva, external inspection of ears and nose normal, hearing intact Oropharynx: normal oropharyngeal mucosa, poor dentition Neck: no thyromegaly, trachea midline, no midline cervical tenderness to palpation Chest: breath sounds equal bilaterally, normal respiratory effort, no midline or lateral chest wall tenderness to palpation/deformity Abdomen: soft, mild RUQ TTP, no bruising, no hepatosplenomegaly Extremities: 2+ radial and pedal pulses bilaterally, intact motor and sensation bilateral UE and LE, no peripheral edema MSK: normal gait/station, no clubbing/cyanosis of fingers/toes, normal ROM of all four extremities Skin: warm, dry, no rashes Psych: normal memory, normal mood/affect     Assessment/Plan: Acute cholecystitis with hyperbilirubinemia - plan lap chole with IOC in AM by Dr. Stevie. Informed consent was obtained from the patient after detailed explanation of risks, including bleeding, infection, biloma, hematoma, injury to common bile duct, injury to other intra-abdominal structures, and need for conversion to open procedure. All questions answered to the patient's satisfaction as well as his daughter at bedside. FEN - CLD, NPO at MN DVT - SCDs, LMWH Dispo - per primary    Dreama GEANNIE Hanger, MD General and Trauma Surgery [facility]

## 2023-12-01 NOTE — ED Notes (Signed)
 Awaiting patient from lobby.

## 2023-12-01 NOTE — ED Triage Notes (Signed)
 Patient states he was seen at his PCP today and labs were abnormal. C/o nausea now  with discomfort in his abd. States his PCP told him he needed to have his gallbladder out.

## 2023-12-02 ENCOUNTER — Encounter (HOSPITAL_COMMUNITY)
Admission: EM | Disposition: A | Discharge status: Home / Self Care | Payer: Self-pay | Source: Ambulatory Visit | Attending: Family Medicine

## 2023-12-02 ENCOUNTER — Observation Stay (HOSPITAL_COMMUNITY): Admitting: Anesthesiology

## 2023-12-02 ENCOUNTER — Other Ambulatory Visit: Payer: Self-pay

## 2023-12-02 ENCOUNTER — Encounter (HOSPITAL_COMMUNITY): Payer: Self-pay | Admitting: Family Medicine

## 2023-12-02 ENCOUNTER — Observation Stay (HOSPITAL_COMMUNITY)

## 2023-12-02 DIAGNOSIS — K811 Chronic cholecystitis: Secondary | ICD-10-CM

## 2023-12-02 DIAGNOSIS — K810 Acute cholecystitis: Secondary | ICD-10-CM | POA: Diagnosis not present

## 2023-12-02 HISTORY — PX: CHOLECYSTECTOMY: SHX55

## 2023-12-02 LAB — CBC
HCT: 34.9 % — ABNORMAL LOW (ref 39.0–52.0)
Hemoglobin: 11 g/dL — ABNORMAL LOW (ref 13.0–17.0)
MCH: 26.7 pg (ref 26.0–34.0)
MCHC: 31.5 g/dL (ref 30.0–36.0)
MCV: 84.7 fL (ref 80.0–100.0)
Platelets: 272 K/uL (ref 150–400)
RBC: 4.12 MIL/uL — ABNORMAL LOW (ref 4.22–5.81)
RDW: 33.6 % — ABNORMAL HIGH (ref 11.5–15.5)
WBC: 25.1 K/uL — ABNORMAL HIGH (ref 4.0–10.5)
nRBC: 0.1 % (ref 0.0–0.2)

## 2023-12-02 LAB — COMPREHENSIVE METABOLIC PANEL WITH GFR
ALT: 395 U/L — ABNORMAL HIGH (ref 0–44)
AST: 163 U/L — ABNORMAL HIGH (ref 15–41)
Albumin: 4 g/dL (ref 3.5–5.0)
Alkaline Phosphatase: 141 U/L — ABNORMAL HIGH (ref 38–126)
Anion gap: 5 (ref 5–15)
BUN: 19 mg/dL (ref 8–23)
CO2: 24 mmol/L (ref 22–32)
Calcium: 9.8 mg/dL (ref 8.9–10.3)
Chloride: 111 mmol/L (ref 98–111)
Creatinine, Ser: 1.3 mg/dL — ABNORMAL HIGH (ref 0.61–1.24)
GFR, Estimated: 56 mL/min — ABNORMAL LOW (ref >60)
Glucose, Bld: 94 mg/dL (ref 70–99)
Potassium: 4.3 mmol/L (ref 3.5–5.1)
Sodium: 140 mmol/L (ref 135–145)
Total Bilirubin: 1.9 mg/dL — ABNORMAL HIGH (ref 0.0–1.2)
Total Protein: 7.4 g/dL (ref 6.5–8.1)

## 2023-12-02 LAB — PROTIME-INR
INR: 1.1 (ref 0.8–1.2)
Prothrombin Time: 15.1 s (ref 11.4–15.2)

## 2023-12-02 LAB — APTT: aPTT: 46 s — ABNORMAL HIGH (ref 24–36)

## 2023-12-02 SURGERY — LAPAROSCOPIC CHOLECYSTECTOMY WITH INTRAOPERATIVE CHOLANGIOGRAM
Anesthesia: General | Site: Abdomen

## 2023-12-02 MED ORDER — ONDANSETRON HCL 4 MG/2ML IJ SOLN
INTRAMUSCULAR | Status: DC | PRN
  Administered 2023-12-02: 4 mg via INTRAVENOUS

## 2023-12-02 MED ORDER — CHLORHEXIDINE GLUCONATE 0.12 % MT SOLN
OROMUCOSAL | Status: AC
  Filled 2023-12-02: qty 15

## 2023-12-02 MED ORDER — PHENYLEPHRINE 80 MCG/ML (10ML) SYRINGE FOR IV PUSH (FOR BLOOD PRESSURE SUPPORT)
PREFILLED_SYRINGE | INTRAVENOUS | Status: DC | PRN
  Administered 2023-12-02: 80 ug via INTRAVENOUS
  Administered 2023-12-02 (×2): 160 ug via INTRAVENOUS
  Administered 2023-12-02: 80 ug via INTRAVENOUS

## 2023-12-02 MED ORDER — FENTANYL CITRATE (PF) 100 MCG/2ML IJ SOLN
25.0000 ug | INTRAMUSCULAR | Status: DC | PRN
  Administered 2023-12-02: 25 ug via INTRAVENOUS

## 2023-12-02 MED ORDER — FENTANYL CITRATE (PF) 100 MCG/2ML IJ SOLN
INTRAMUSCULAR | Status: AC
  Filled 2023-12-02: qty 2

## 2023-12-02 MED ORDER — ORAL CARE MOUTH RINSE: 15.0000 mL | Freq: Once | OROMUCOSAL | Status: AC

## 2023-12-02 MED ORDER — FENTANYL CITRATE (PF) 250 MCG/5ML IJ SOLN
INTRAMUSCULAR | Status: DC | PRN
  Administered 2023-12-02: 100 ug via INTRAVENOUS
  Administered 2023-12-02 (×3): 50 ug via INTRAVENOUS

## 2023-12-02 MED ORDER — SODIUM CHLORIDE 0.9 % IV SOLN: INTRAVENOUS | Status: DC

## 2023-12-02 MED ORDER — LIDOCAINE 2% (20 MG/ML) 5 ML SYRINGE
INTRAMUSCULAR | Status: AC
  Filled 2023-12-02: qty 5

## 2023-12-02 MED ORDER — ONDANSETRON HCL 4 MG/2ML IJ SOLN
INTRAMUSCULAR | Status: AC
  Filled 2023-12-02: qty 2

## 2023-12-02 MED ORDER — LACTATED RINGERS IV SOLN: INTRAVENOUS | Status: DC

## 2023-12-02 MED ORDER — LACTATED RINGERS IV SOLN
INTRAVENOUS | Status: DC | PRN
Start: 2023-12-02 — End: 2023-12-02

## 2023-12-02 MED ORDER — ACETAMINOPHEN 10 MG/ML IV SOLN
INTRAVENOUS | Status: AC
Start: 2023-12-02 — End: 2023-12-02
  Filled 2023-12-02: qty 100

## 2023-12-02 MED ORDER — ROCURONIUM BROMIDE 10 MG/ML (PF) SYRINGE
PREFILLED_SYRINGE | INTRAVENOUS | Status: AC
  Filled 2023-12-02: qty 10

## 2023-12-02 MED ORDER — BUPIVACAINE HCL (PF) 0.25 % IJ SOLN
INTRAMUSCULAR | Status: AC
  Filled 2023-12-02: qty 30

## 2023-12-02 MED ORDER — ONDANSETRON HCL 4 MG/2ML IJ SOLN: 4.0000 mg | Freq: Once | INTRAMUSCULAR | Status: DC | PRN

## 2023-12-02 MED ORDER — ACETAMINOPHEN 10 MG/ML IV SOLN: 1000.0000 mg | Freq: Once | INTRAVENOUS | Status: DC | PRN

## 2023-12-02 MED ORDER — FENTANYL CITRATE (PF) 250 MCG/5ML IJ SOLN
INTRAMUSCULAR | Status: AC
  Filled 2023-12-02: qty 5

## 2023-12-02 MED ORDER — LIDOCAINE 2% (20 MG/ML) 5 ML SYRINGE
INTRAMUSCULAR | Status: DC | PRN
  Administered 2023-12-02: 60 mg via INTRAVENOUS

## 2023-12-02 MED ORDER — PROPOFOL 10 MG/ML IV BOLUS
INTRAVENOUS | Status: DC | PRN
  Administered 2023-12-02: 200 mg via INTRAVENOUS

## 2023-12-02 MED ORDER — HYDROMORPHONE HCL 1 MG/ML IJ SOLN
0.5000 mg | INTRAMUSCULAR | Status: DC | PRN
  Administered 2023-12-02 – 2023-12-03 (×3): 0.5 mg via INTRAVENOUS
  Filled 2023-12-02 (×4): qty 0.5

## 2023-12-02 MED ORDER — AMISULPRIDE (ANTIEMETIC) 5 MG/2ML IV SOLN: 10.0000 mg | Freq: Once | INTRAVENOUS | Status: DC | PRN

## 2023-12-02 MED ORDER — BUPIVACAINE HCL 0.25 % IJ SOLN
INTRAMUSCULAR | Status: DC | PRN
  Administered 2023-12-02: 30 mL

## 2023-12-02 MED ORDER — SUGAMMADEX SODIUM 200 MG/2ML IV SOLN
INTRAVENOUS | Status: DC | PRN
  Administered 2023-12-02: 200 mg via INTRAVENOUS

## 2023-12-02 MED ORDER — OXYCODONE HCL 5 MG PO TABS
5.0000 mg | ORAL_TABLET | ORAL | Status: DC | PRN
  Administered 2023-12-02 – 2023-12-03 (×3): 5 mg via ORAL
  Filled 2023-12-02 (×2): qty 1

## 2023-12-02 MED ORDER — CHLORHEXIDINE GLUCONATE 0.12 % MT SOLN
15.0000 mL | Freq: Once | OROMUCOSAL | Status: AC
  Administered 2023-12-02: 15 mL via OROMUCOSAL

## 2023-12-02 MED ORDER — SODIUM CHLORIDE 0.9 % IR SOLN
Status: DC | PRN
  Administered 2023-12-02: 1000 mL

## 2023-12-02 MED ORDER — 0.9 % SODIUM CHLORIDE (POUR BTL) OPTIME
TOPICAL | Status: DC | PRN
  Administered 2023-12-02: 1000 mL

## 2023-12-02 MED ORDER — ROCURONIUM BROMIDE 10 MG/ML (PF) SYRINGE
PREFILLED_SYRINGE | INTRAVENOUS | Status: DC | PRN
  Administered 2023-12-02: 50 mg via INTRAVENOUS

## 2023-12-02 MED ORDER — PROPOFOL 10 MG/ML IV BOLUS
INTRAVENOUS | Status: AC
  Filled 2023-12-02: qty 20

## 2023-12-02 MED ORDER — DEXAMETHASONE SODIUM PHOSPHATE 10 MG/ML IJ SOLN
INTRAMUSCULAR | Status: AC
  Filled 2023-12-02: qty 1

## 2023-12-02 MED ORDER — SODIUM CHLORIDE 0.9 % IV SOLN
12.5000 mg | Freq: Four times a day (QID) | INTRAVENOUS | Status: DC | PRN
  Filled 2023-12-02: qty 0.5

## 2023-12-02 MED ORDER — SILVER NITRATE-POT NITRATE 75-25 % EX MISC
CUTANEOUS | Status: AC
  Filled 2023-12-02: qty 10

## 2023-12-02 SURGICAL SUPPLY — 32 items
BAG COUNTER SPONGE SURGICOUNT (BAG) ×1 IMPLANT
BLADE CLIPPER SURG (BLADE) IMPLANT
CANISTER SUCTION 3000ML PPV (SUCTIONS) ×1 IMPLANT
CHLORAPREP W/TINT 26 (MISCELLANEOUS) ×1 IMPLANT
CLIP LIGATING HEMO O LOK GREEN (MISCELLANEOUS) IMPLANT
COVER SURGICAL LIGHT HANDLE (MISCELLANEOUS) ×1 IMPLANT
DERMABOND ADVANCED .7 DNX12 (GAUZE/BANDAGES/DRESSINGS) ×1 IMPLANT
ELECTRODE REM PT RTRN 9FT ADLT (ELECTROSURGICAL) ×1 IMPLANT
GLOVE BIOGEL PI IND STRL 7.0 (GLOVE) ×1 IMPLANT
GLOVE SURG SS PI 7.0 STRL IVOR (GLOVE) ×1 IMPLANT
GOWN STRL REUS W/ TWL LRG LVL3 (GOWN DISPOSABLE) ×3 IMPLANT
GRASPER SUT TROCAR 14GX15 (MISCELLANEOUS) ×1 IMPLANT
IRRIGATION SUCT STRKRFLW 2 WTP (MISCELLANEOUS) ×1 IMPLANT
KIT BASIN OR (CUSTOM PROCEDURE TRAY) ×1 IMPLANT
KIT TURNOVER KIT B (KITS) ×1 IMPLANT
NDL 22X1.5 STRL (OR ONLY) (MISCELLANEOUS) ×1 IMPLANT
NEEDLE 22X1.5 STRL (OR ONLY) (MISCELLANEOUS) ×1 IMPLANT
NS IRRIG 1000ML POUR BTL (IV SOLUTION) ×1 IMPLANT
PAD ARMBOARD POSITIONER FOAM (MISCELLANEOUS) ×1 IMPLANT
POUCH RETRIEVAL ECOSAC 10 (ENDOMECHANICALS) ×1 IMPLANT
SCISSORS LAP 5X35 DISP (ENDOMECHANICALS) ×1 IMPLANT
SET TUBE SMOKE EVAC HIGH FLOW (TUBING) ×1 IMPLANT
SLEEVE Z-THREAD 5X100MM (TROCAR) ×2 IMPLANT
SPECIMEN JAR SMALL (MISCELLANEOUS) ×1 IMPLANT
SUT MNCRL AB 4-0 PS2 18 (SUTURE) ×1 IMPLANT
SUT VICRYL 0 UR6 27IN ABS (SUTURE) IMPLANT
TOWEL GREEN STERILE FF (TOWEL DISPOSABLE) ×1 IMPLANT
TRAY LAPAROSCOPIC MC (CUSTOM PROCEDURE TRAY) ×1 IMPLANT
TROCAR Z THREAD OPTICAL 12X100 (TROCAR) ×1 IMPLANT
TROCAR Z-THREAD OPTICAL 5X100M (TROCAR) ×1 IMPLANT
WARMER LAPAROSCOPE (MISCELLANEOUS) ×1 IMPLANT
WATER STERILE IRR 1000ML POUR (IV SOLUTION) ×1 IMPLANT

## 2023-12-02 NOTE — Care Management Obs Status (Signed)
 MEDICARE OBSERVATION STATUS NOTIFICATION   Patient Details  Name: Roy Johnson MRN: [id number] Date of Birth: [birth year]   Medicare Observation Status Notification Given:  Yes Patient sleeping, unable to arouse. I spoke to patient's spouse telephonically.    Jon  Joachim Carton-Martin 12/02/2023, 3:02 PM

## 2023-12-02 NOTE — Plan of Care (Signed)
  Problem: Education: Goal: Knowledge of General Education information will improve Description: Including pain rating scale, medication(s)/side effects and non-pharmacologic comfort measures Outcome: Progressing   Problem: Health Behavior/Discharge Planning: Goal: Ability to manage health-related needs will improve Outcome: Progressing   Problem: Pain Managment: Goal: General experience of comfort will improve and/or be controlled Outcome: Progressing   Problem: Safety: Goal: Ability to remain free from injury will improve Outcome: Progressing   Problem: Skin Integrity: Goal: Risk for impaired skin integrity will decrease Outcome: Progressing

## 2023-12-02 NOTE — Progress Notes (Signed)
 Pre Procedure note for inpatients:   Roy Johnson has been scheduled for Procedure(s): LAPAROSCOPIC CHOLECYSTECTOMY WITH INTRAOPERATIVE CHOLANGIOGRAM (N/A) today. The various methods of treatment have been discussed with the patient. After consideration of the risks, benefits and treatment options the patient has consented to the planned procedure.   The patient has been seen and labs reviewed. There are no changes in the patient's condition to prevent proceeding with the planned procedure today.  Recent labs:  Lab Results  Component Value Date   WBC 33.9 (H) 12/01/2023   HGB 12.1 (L) 12/01/2023   HCT 39.1 12/01/2023   PLT 275 12/01/2023   GLUCOSE 112 (H) 12/01/2023   ALT 584 (H) 12/01/2023   AST 458 (H) 12/01/2023   NA 138 12/01/2023   K 5.5 (H) 12/01/2023   CL 105 12/01/2023   CREATININE 1.24 12/01/2023   BUN 25 (H) 12/01/2023   CO2 24 12/01/2023   TSH 2.440 04/07/2021    Herlene Beverley Bureau, MD 12/02/2023 7:57 AM

## 2023-12-02 NOTE — Op Note (Signed)
 PATIENT:  Roy Johnson  78 y.o. male  PRE-OPERATIVE DIAGNOSIS:  Cholecystitis  POST-OPERATIVE DIAGNOSIS:  Cholecystitis  PROCEDURE:  Procedure(s): LAPAROSCOPIC CHOLECYSTECTOMY   SURGEON:  Satcha Storlie, Herlene Righter, MD   ASSISTANT: none  ANESTHESIA:   local and general  Indications for procedure: Roy Johnson is a 78 y.o. male with symptoms of Abdominal pain and Nausea and vomiting consistent with gallbladder disease, Confirmed by ultrasound.  Description of procedure: The patient was brought into the operative suite, placed supine. Anesthesia was administered with endotracheal tube. Patient was strapped in place and foot board was secured. All pressure points were offloaded by foam padding. The patient was prepped and draped in the usual sterile fashion.  A periumbilical incision was made and optical entry was used to enter the abdomen. 2 5 mm trocars were placed on in the right lateral space on in the right subcostal space. A 12mm trocar was placed in the subxiphoid space. Marcaine  was infused to the subxiphoid space and lateral upper right abdomen in the transversus abdominis plane. Next the patient was placed in reverse trendelenberg. The gallbladder appearedfull of stones and chronically inflamed.   The gallbladder was retracted cephalad and lateral. The peritoneum was reflected off the infundibulum working lateral to medial. The cystic duct and cystic artery were identified and further dissection revealed a critical view. The cystic duct and cystic artery were doubly clipped and ligated.   The gallbladder was removed off the liver bed with cautery. The Gallbladder was placed in a specimen bag. The gallbladder fossa was irrigated and hemostasis was applied with cautery. The gallbladder was removed via the 12mm trocar. The fascial defect was closed with interrupted 0 vicryl suture via laparoscopic trans-fascial suture passer. Loose stones were removed. Pneumoperitoneum was removed, all trocar  were removed. All incisions were closed with 4-0 monocryl subcuticular stitch. The patient woke from anesthesia and was brought to PACU in stable condition. All counts were correct  Findings: chronic cholecystitis  Specimen: gallbladder  Blood loss: 30 ml  Local anesthesia: 30 ml Marcaine   Complications: none  PLAN OF CARE: Admit to inpatient   PATIENT DISPOSITION:  PACU - hemodynamically stable.   Herlene Righter [facility], [place]

## 2023-12-02 NOTE — Plan of Care (Signed)
   Problem: Education: Goal: Knowledge of General Education information will improve Description Including pain rating scale, medication(s)/side effects and non-pharmacologic comfort measures Outcome: Progressing   Problem: Health Behavior/Discharge Planning: Goal: Ability to manage health-related needs will improve Outcome: Progressing

## 2023-12-02 NOTE — Anesthesia Preprocedure Evaluation (Addendum)
 Anesthesia Evaluation  Patient identified by MRN, date of birth, ID band Patient awake    Reviewed: Allergy & Precautions, NPO status , Patient's Chart, lab work & pertinent test results  Airway Mallampati: II  TM Distance: >3 FB Neck ROM: Full    Dental no notable dental hx.    Pulmonary former smoker   Pulmonary exam normal        Cardiovascular hypertension, Pt. on medications Normal cardiovascular exam  ECHO: 1. Left ventricular ejection fraction, by estimation, is 60 to 65%. The  left ventricle has normal function. The left ventricle has no regional  wall motion abnormalities. Left ventricular diastolic parameters were  normal. The average left ventricular  global longitudinal strain is -20.8 %. The global longitudinal strain is  normal.   2. Right ventricular systolic function is normal. The right ventricular  size is normal. There is normal pulmonary artery systolic pressure. The  estimated right ventricular systolic pressure is 25.8 mmHg.   3. The mitral valve is grossly normal. Mild mitral valve regurgitation.  There is mild late systolic prolapse of the medial scallop of the  posterior leaflet of the mitral valve.   4. The aortic valve is tricuspid. Aortic valve regurgitation is not  visualized. No aortic stenosis is present.   5. The inferior vena cava is normal in size with greater than 50%  respiratory variability, suggesting right atrial pressure of 3 mmHg.     Neuro/Psych  Neuromuscular disease  negative psych ROS   GI/Hepatic negative GI ROS, Neg liver ROS,,,  Endo/Other  negative endocrine ROS    Renal/GU Renal disease     Musculoskeletal negative musculoskeletal ROS (+)    Abdominal   Peds  Hematology  (+) Blood dyscrasia, anemia MDS (myelodysplastic syndrome)   Anesthesia Other Findings   Reproductive/Obstetrics                              Anesthesia  Physical Anesthesia Plan  ASA: 2  Anesthesia Plan: General   Post-op Pain Management:    Induction: Intravenous  PONV Risk Score and Plan: 2 and Ondansetron , Dexamethasone  and Treatment may vary due to age or medical condition  Airway Management Planned: Oral ETT  Additional Equipment:   Intra-op Plan:   Post-operative Plan: Extubation in OR  Informed Consent: I have reviewed the patients History and Physical, chart, labs and discussed the procedure including the risks, benefits and alternatives for the proposed anesthesia with the patient or authorized representative who has indicated his/her understanding and acceptance.   Patient has DNR.  Discussed DNR with patient and Suspend DNR.   Dental advisory given  Plan Discussed with: CRNA  Anesthesia Plan Comments:          Anesthesia Quick Evaluation

## 2023-12-02 NOTE — Hospital Course (Signed)
 Roy Johnson is a 78 y.o. male with a history of MDS, essential thrombocythemia, chronic diarrhea, prostate CA who presented to the ED with upper abdominal pain focused in RUQ radiating to the chest, constant, waxing/waning associated with nausea and poor appetite. He had presented to his PCP where LFTs were found to be elevated, so he was referred to the ED. In the ED, U/S confirmed findings suggestive of acute cholecystitis. Antibiotics given, surgery consulted, and medicine admission requested.  Underwent lap chole. Assessment and Plan: Acute calculous cholecystitis:  CBD appears normal on U/S.  Surgery consulted. Underwent laparoscopic cholecystectomy 8/23 with Dr. Stevie.  No IOC performed. Monitor postop recovery. LFT improving. Antibiotic now discontinued postoperatively.   Myelodysplastic syndrome, JAK2-positive  essential thrombocythemia,  chronic anemia with history of transfusions:  Patient sees Dr. Sherrod Does not take Revlimid right now. On anagrelide , resume twice daily. Also receives EPO injections. Transfuse for hemoglobin less than 7. Due to anemia holding VTE prophylaxis chemically.  SCDs.   Hyperkalemia:  Resolved with fluid, Lokelma , calcium  gluconate   DNR: POA. Discussed with pt/daughter at bedside.  No limitations on treatment desired unless case of cardiac/respiratory arrest.  Headache. No focal deficit on exam. Will increase pain medication regimen. Monitor.  Mild renal insufficiency. Renal function worsened after IV fluid. For now we will monitor

## 2023-12-02 NOTE — Progress Notes (Signed)
 [facility] Progress Note Patient: Roy Johnson DOB: [birth year] DOA: 12/01/2023  DOS: the patient was seen and examined on 12/02/2023  Brief Hospital Course: Roy Johnson is a 78 y.o. male with a history of MDS, essential thrombocythemia, chronic diarrhea, prostate CA who presented to the ED with upper abdominal pain focused in RUQ radiating to the chest, constant, waxing/waning associated with nausea and poor appetite. He had presented to his PCP where LFTs were found to be elevated, so he was referred to the ED. In the ED, U/S confirmed findings suggestive of acute cholecystitis. Antibiotics given, surgery consulted, and medicine admission requested.  Underwent lap chole.  Assessment and Plan: Acute calculous cholecystitis:  CBD appears normal on U/S.  Surgery consulted. Underwent laparoscopic cholecystectomy 8/23 with Dr. Stevie.  Monitor postop recovery. LFT improving. Antibiotic now discontinued postoperatively.   Myelodysplastic syndrome, JAK2-positive  essential thrombocythemia,  chronic anemia with history of transfusions:  Patient sees Dr. Marmol. Does not take Revlimid right now. On anagrelide , will resume tomorrow to reduce the risk of the bleeding. Also receives EPO injections. Transfuse for hemoglobin less than 7. On VTE prophylaxis, will resume tomorrow as well.   Hyperkalemia:  Resolved with fluid, Lokelma , calcium  gluconate   DNR: POA. Discussed with pt/daughter at bedside.  No limitations on treatment desired unless case of cardiac/respiratory arrest.  Headache. No focal deficit on exam. Will increase pain medication regimen. Monitor.   Subjective: No nausea or vomiting.  Reports headache.  No fever no chills.  Reports dry mouth.  Physical Exam: Clear to auscultation. S1-S2 present. Alert awake and oriented x 3, no focal deficit, finger-nose-finger normal. Bowel sound present.  Tender on right.  Data Reviewed: I have Reviewed  nursing notes, Vitals, and Lab results. Since last encounter, pertinent lab results CBC and BMP   . I have ordered test including CBC and CMP  .    Disposition: Status is: Observation   Family Communication: No one at bedside Level of care: Med-Surg   Vitals:   12/02/23 1045 12/02/23 1100 12/02/23 1127 12/02/23 1509  BP: (!) 147/67 (!) 155/72  (!) 142/70  Pulse: 62 63  73  Resp: 12 16  15   Temp: 97.8 F (36.6 C) 98 F (36.7 C)  98 F (36.7 C)  TempSrc:  Oral  Oral  SpO2: 94% 98%  93%  Weight:   95.3 kg   Height:   6' 2 (1.88 m)      Author: Yetta Blanch, MD 12/02/2023 6:08 PM  Please look on [email or web address] to find out who is on call.

## 2023-12-02 NOTE — Anesthesia Postprocedure Evaluation (Signed)
 Anesthesia Post Note  Patient: Roy Johnson  Procedure(s) Performed: LAPAROSCOPIC CHOLECYSTECTOMY (Abdomen)     Patient location during evaluation: PACU Anesthesia Type: General Level of consciousness: awake Pain management: pain level controlled Vital Signs Assessment: post-procedure vital signs reviewed and stable Respiratory status: spontaneous breathing, nonlabored ventilation and respiratory function stable Cardiovascular status: blood pressure returned to baseline and stable Postop Assessment: no apparent nausea or vomiting Anesthetic complications: no   No notable events documented.  Last Vitals:  Vitals:   12/02/23 1045 12/02/23 1100  BP: (!) 147/67 (!) 155/72  Pulse: 62 63  Resp: 12 16  Temp: 36.6 C 36.7 C  SpO2: 94% 98%    Last Pain:  Vitals:   12/02/23 1306  TempSrc:   PainSc: 3                  Amorie Rentz P Jaselyn Nahm

## 2023-12-02 NOTE — Care Management Obs Status (Signed)
 MEDICARE OBSERVATION STATUS NOTIFICATION   Patient Details  Name: Roy Johnson MRN: [id number] Date of Birth: Aug 12, 1945   Medicare Observation Status Notification Given:  Yes    Jon Cruel 12/02/2023, 3:02 PM

## 2023-12-02 NOTE — Anesthesia Procedure Notes (Signed)
 Procedure Name: Intubation Date/Time: 12/02/2023 8:13 AM  Performed by: Claudene Hoy CROME, CRNAPre-anesthesia Checklist: Patient identified, Emergency Drugs available, Suction available and Patient being monitored Patient Re-evaluated:Patient Re-evaluated prior to induction Oxygen Delivery Method: Circle System Utilized Preoxygenation: Pre-oxygenation with 100% oxygen Induction Type: IV induction Ventilation: Mask ventilation without difficulty Laryngoscope Size: Mac and 4 Grade View: Grade I Tube type: Oral Tube size: 7.5 mm Number of attempts: 1 Airway Equipment and Method: Stylet Placement Confirmation: ETT inserted through vocal cords under direct vision, positive ETCO2 and breath sounds checked- equal and bilateral Secured at: 23 cm Tube secured with: Tape Dental Injury: Teeth and Oropharynx as per pre-operative assessment

## 2023-12-02 NOTE — Transfer of Care (Signed)
 Immediate Anesthesia Transfer of Care Note  Patient: [facility]  Procedure(s) Performed: LAPAROSCOPIC CHOLECYSTECTOMY (Abdomen)  Patient Location: PACU  Anesthesia Type:General  Level of Consciousness: drowsy and patient cooperative  Airway & Oxygen Therapy: Patient Spontanous Breathing and Patient connected to face mask oxygen  Post-op Assessment: Report given to RN and Post -op Vital signs reviewed and stable  Post vital signs: Reviewed and stable  Last Vitals:  Vitals Value Taken Time  BP 151/91 12/02/23 09:27  Temp    Pulse 69 12/02/23 09:29  Resp 17 12/02/23 09:29  SpO2 92 % 12/02/23 09:29  Vitals shown include unfiled device data.  Last Pain:  Vitals:   12/02/23 0746  TempSrc: Oral  PainSc: 0-No pain         Complications: No notable events documented.

## 2023-12-03 DIAGNOSIS — Z808 Family history of malignant neoplasm of other organs or systems: Secondary | ICD-10-CM | POA: Diagnosis not present

## 2023-12-03 DIAGNOSIS — E875 Hyperkalemia: Secondary | ICD-10-CM | POA: Diagnosis present

## 2023-12-03 DIAGNOSIS — K810 Acute cholecystitis: Secondary | ICD-10-CM | POA: Diagnosis present

## 2023-12-03 DIAGNOSIS — D473 Essential (hemorrhagic) thrombocythemia: Secondary | ICD-10-CM | POA: Diagnosis present

## 2023-12-03 DIAGNOSIS — D469 Myelodysplastic syndrome, unspecified: Secondary | ICD-10-CM | POA: Diagnosis present

## 2023-12-03 DIAGNOSIS — Z9079 Acquired absence of other genital organ(s): Secondary | ICD-10-CM | POA: Diagnosis not present

## 2023-12-03 DIAGNOSIS — Z8042 Family history of malignant neoplasm of prostate: Secondary | ICD-10-CM | POA: Diagnosis not present

## 2023-12-03 DIAGNOSIS — Z8546 Personal history of malignant neoplasm of prostate: Secondary | ICD-10-CM | POA: Diagnosis not present

## 2023-12-03 DIAGNOSIS — K819 Cholecystitis, unspecified: Secondary | ICD-10-CM | POA: Diagnosis present

## 2023-12-03 DIAGNOSIS — Z79899 Other long term (current) drug therapy: Secondary | ICD-10-CM | POA: Diagnosis not present

## 2023-12-03 DIAGNOSIS — Z66 Do not resuscitate: Secondary | ICD-10-CM | POA: Diagnosis present

## 2023-12-03 DIAGNOSIS — Z803 Family history of malignant neoplasm of breast: Secondary | ICD-10-CM | POA: Diagnosis not present

## 2023-12-03 DIAGNOSIS — H9190 Unspecified hearing loss, unspecified ear: Secondary | ICD-10-CM | POA: Diagnosis present

## 2023-12-03 DIAGNOSIS — Z87891 Personal history of nicotine dependence: Secondary | ICD-10-CM | POA: Diagnosis not present

## 2023-12-03 DIAGNOSIS — I10 Essential (primary) hypertension: Secondary | ICD-10-CM | POA: Diagnosis present

## 2023-12-03 DIAGNOSIS — K8012 Calculus of gallbladder with acute and chronic cholecystitis without obstruction: Secondary | ICD-10-CM | POA: Diagnosis present

## 2023-12-03 DIAGNOSIS — Z801 Family history of malignant neoplasm of trachea, bronchus and lung: Secondary | ICD-10-CM | POA: Diagnosis not present

## 2023-12-03 DIAGNOSIS — Z8249 Family history of ischemic heart disease and other diseases of the circulatory system: Secondary | ICD-10-CM | POA: Diagnosis not present

## 2023-12-03 DIAGNOSIS — E785 Hyperlipidemia, unspecified: Secondary | ICD-10-CM | POA: Diagnosis present

## 2023-12-03 DIAGNOSIS — K219 Gastro-esophageal reflux disease without esophagitis: Secondary | ICD-10-CM | POA: Diagnosis present

## 2023-12-03 DIAGNOSIS — G4700 Insomnia, unspecified: Secondary | ICD-10-CM | POA: Diagnosis present

## 2023-12-03 DIAGNOSIS — K589 Irritable bowel syndrome without diarrhea: Secondary | ICD-10-CM | POA: Diagnosis present

## 2023-12-03 LAB — DIFFERENTIAL
Abs Granulocyte: 27 K/uL — ABNORMAL HIGH (ref 1.5–6.5)
Abs Immature Granulocytes: 2.08 K/uL — ABNORMAL HIGH (ref 0.00–0.07)
Basophils Absolute: 0.3 K/uL — ABNORMAL HIGH (ref 0.0–0.1)
Basophils Relative: 1 %
Eosinophils Absolute: 0.5 K/uL (ref 0.0–0.5)
Eosinophils Relative: 1 %
Immature Granulocytes: 6 %
Lymphocytes Relative: 6 %
Lymphs Abs: 2.1 K/uL (ref 0.7–4.0)
Monocytes Absolute: 0.8 K/uL (ref 0.1–1.0)
Monocytes Relative: 2 %
Neutro Abs: 27 K/uL — ABNORMAL HIGH (ref 1.7–7.7)
Neutrophils Relative %: 84 %
Smear Review: NORMAL

## 2023-12-03 LAB — COMPREHENSIVE METABOLIC PANEL WITH GFR
ALT: 236 U/L — ABNORMAL HIGH (ref 0–44)
AST: 67 U/L — ABNORMAL HIGH (ref 15–41)
Albumin: 3.3 g/dL — ABNORMAL LOW (ref 3.5–5.0)
Alkaline Phosphatase: 119 U/L (ref 38–126)
Anion gap: 10 (ref 5–15)
BUN: 18 mg/dL (ref 8–23)
CO2: 23 mmol/L (ref 22–32)
Calcium: 8.9 mg/dL (ref 8.9–10.3)
Chloride: 104 mmol/L (ref 98–111)
Creatinine, Ser: 1.41 mg/dL — ABNORMAL HIGH (ref 0.61–1.24)
GFR, Estimated: 51 mL/min — ABNORMAL LOW (ref >60)
Glucose, Bld: 62 mg/dL — ABNORMAL LOW (ref 70–99)
Potassium: 4.1 mmol/L (ref 3.5–5.1)
Sodium: 137 mmol/L (ref 135–145)
Total Bilirubin: 1.3 mg/dL — ABNORMAL HIGH (ref 0.0–1.2)
Total Protein: 6.5 g/dL (ref 6.5–8.1)

## 2023-12-03 LAB — CBC
HCT: 31.3 % — ABNORMAL LOW (ref 39.0–52.0)
HCT: 28.7 % — ABNORMAL LOW (ref 39.0–52.0)
Hemoglobin: 9.5 g/dL — ABNORMAL LOW (ref 13.0–17.0)
Hemoglobin: 9 g/dL — ABNORMAL LOW (ref 13.0–17.0)
MCH: 26.5 pg (ref 26.0–34.0)
MCH: 26.9 pg (ref 26.0–34.0)
MCHC: 30.4 g/dL (ref 30.0–36.0)
MCHC: 31.4 g/dL (ref 30.0–36.0)
MCV: 87.4 fL (ref 80.0–100.0)
MCV: 85.9 fL (ref 80.0–100.0)
Platelets: 284 K/uL (ref 150–400)
Platelets: 263 K/uL (ref 150–400)
RBC: 3.58 MIL/uL — ABNORMAL LOW (ref 4.22–5.81)
RBC: 3.34 MIL/uL — ABNORMAL LOW (ref 4.22–5.81)
RDW: 33.4 % — ABNORMAL HIGH (ref 11.5–15.5)
RDW: 33.3 % — ABNORMAL HIGH (ref 11.5–15.5)
WBC: 35.9 K/uL — ABNORMAL HIGH (ref 4.0–10.5)
WBC: 32.7 K/uL — ABNORMAL HIGH (ref 4.0–10.5)
nRBC: 0.1 % (ref 0.0–0.2)
nRBC: 0.1 % (ref 0.0–0.2)

## 2023-12-03 LAB — MAGNESIUM: Magnesium: 1.9 mg/dL (ref 1.7–2.4)

## 2023-12-03 MED ORDER — ACETAMINOPHEN 325 MG PO TABS: 650.0000 mg | ORAL_TABLET | Freq: Four times a day (QID) | ORAL | Status: DC | PRN

## 2023-12-03 MED ORDER — ACETAMINOPHEN 325 MG PO TABS
650.0000 mg | ORAL_TABLET | Freq: Three times a day (TID) | ORAL | Status: DC
  Administered 2023-12-03 – 2023-12-04 (×3): 650 mg via ORAL
  Filled 2023-12-03 (×3): qty 2

## 2023-12-03 MED ORDER — OXYCODONE HCL 5 MG PO TABS
5.0000 mg | ORAL_TABLET | ORAL | Status: DC | PRN
  Administered 2023-12-04: 5 mg via ORAL
  Filled 2023-12-03: qty 1

## 2023-12-03 MED ORDER — METHOCARBAMOL 500 MG PO TABS: 500.0000 mg | ORAL_TABLET | Freq: Three times a day (TID) | ORAL | Status: DC | PRN

## 2023-12-03 MED ORDER — ACETAMINOPHEN 500 MG PO TABS: 1000.0000 mg | ORAL_TABLET | Freq: Four times a day (QID) | ORAL | Status: DC

## 2023-12-03 MED ORDER — ANAGRELIDE HCL 0.5 MG PO CAPS: 1.0000 mg | ORAL_CAPSULE | Freq: Two times a day (BID) | ORAL | Status: DC

## 2023-12-03 MED ORDER — DOCUSATE SODIUM 100 MG PO CAPS
100.0000 mg | ORAL_CAPSULE | Freq: Two times a day (BID) | ORAL | Status: DC
  Administered 2023-12-03 – 2023-12-04 (×3): 100 mg via ORAL
  Filled 2023-12-03 (×3): qty 1

## 2023-12-03 MED ORDER — ANAGRELIDE HCL 0.5 MG PO CAPS
0.5000 mg | ORAL_CAPSULE | Freq: Two times a day (BID) | ORAL | Status: DC
  Administered 2023-12-03: 0.5 mg via ORAL
  Filled 2023-12-03 (×3): qty 1

## 2023-12-03 MED ORDER — POLYETHYLENE GLYCOL 3350 17 G PO PACK: 17.0000 g | PACK | Freq: Every day | ORAL | Status: DC | PRN

## 2023-12-03 MED ORDER — ANAGRELIDE HCL 0.5 MG PO CAPS
1.0000 mg | ORAL_CAPSULE | Freq: Two times a day (BID) | ORAL | Status: DC
  Administered 2023-12-03: 0.5 mg via ORAL
  Filled 2023-12-03 (×2): qty 2

## 2023-12-03 NOTE — Plan of Care (Signed)
  Problem: Education: Goal: Knowledge of General Education information will improve Description: Including pain rating scale, medication(s)/side effects and non-pharmacologic comfort measures Outcome: Progressing   Problem: Health Behavior/Discharge Planning: Goal: Ability to manage health-related needs will improve Outcome: Progressing   Problem: Nutrition: Goal: Adequate nutrition will be maintained Outcome: Progressing   Problem: Pain Managment: Goal: General experience of comfort will improve and/or be controlled Outcome: Progressing

## 2023-12-03 NOTE — Plan of Care (Signed)

## 2023-12-03 NOTE — Progress Notes (Signed)
 1 Day Post-Op  Subjective: CC: Patient reports n/v post op yesterday. Had drank some coffee this am without n/v. Having pain in his epigastrium/ruq near his incisions that is worse with coughing. Mobilizing. Voiding.   Objective: Vital signs in last 24 hours: Temp:  [97.7 F (36.5 C)-98.6 F (37 C)] 98.4 F (36.9 C) (08/24 0850) Pulse Rate:  [61-81] 80 (08/24 0850) Resp:  [11-18] 15 (08/24 0850) BP: (127-155)/(59-91) 128/70 (08/24 0850) SpO2:  [90 %-99 %] 93 % (08/24 0850) Weight:  [95.3 kg] 95.3 kg (08/23 1127)    Intake/Output from previous day: 08/23 0701 - 08/24 0700 In: 1152 [P.O.:60; I.V.:1092] Out: 260 [Urine:250; Blood:10] Intake/Output this shift: No intake/output data recorded.  PE: Gen:  Alert, NAD, pleasant Pulm:  Rate and effort normal Abd: Soft, no distension, epigastric, RUQ ttp and appropriately tender around laparoscopic incisions, no rigidity or guarding and otherwise NT, +BS. Umbilical incisions with gauze and tegarderm in place, cdi. R laparoscopic incision with dressing in place, cdi - taken down, wound hemostatic. Remaninig incisions with glue intact appears well and are without drainage, bleeding, or signs of infection.   Lab Results:  Recent Labs    12/02/23 0701 12/03/23 0530  WBC 25.1* 35.9*  HGB 11.0* 9.5*  HCT 34.9* 31.3*  PLT 272 284   BMET Recent Labs    12/02/23 0701 12/03/23 0530  NA 140 137  K 4.3 4.1  CL 111 104  CO2 24 23  GLUCOSE 94 62*  BUN 19 18  CREATININE 1.30* 1.41*  CALCIUM  9.8 8.9   PT/INR Recent Labs    12/02/23 0701  LABPROT 15.1  INR 1.1   CMP     Component Value Date/Time   NA 137 12/03/2023 0530   NA 141 12/08/2016 0928   K 4.1 12/03/2023 0530   K 4.5 12/08/2016 0928   CL 104 12/03/2023 0530   CO2 23 12/03/2023 0530   CO2 27 12/08/2016 0928   GLUCOSE 62 (L) 12/03/2023 0530   GLUCOSE 150 (H) 12/08/2016 0928   BUN 18 12/03/2023 0530   BUN 14.1 12/08/2016 0928   CREATININE 1.41 (H) 12/03/2023  0530   CREATININE 1.36 (H) 11/28/2023 0846   CREATININE 0.9 12/08/2016 0928   CALCIUM  8.9 12/03/2023 0530   CALCIUM  9.0 12/08/2016 0928   PROT 6.5 12/03/2023 0530   PROT 7.3 04/01/2020 0954   PROT 6.6 12/08/2016 0928   ALBUMIN 3.3 (L) 12/03/2023 0530   ALBUMIN 3.7 12/08/2016 0928   AST 67 (H) 12/03/2023 0530   AST 11 (L) 11/28/2023 0846   AST 25 12/08/2016 0928   ALT 236 (H) 12/03/2023 0530   ALT 11 11/28/2023 0846   ALT 41 12/08/2016 0928   ALKPHOS 119 12/03/2023 0530   ALKPHOS 87 12/08/2016 0928   BILITOT 1.3 (H) 12/03/2023 0530   BILITOT 0.5 11/28/2023 0846   BILITOT 0.84 12/08/2016 0928   GFRNONAA 51 (L) 12/03/2023 0530   GFRNONAA 53 (L) 11/28/2023 0846   GFRAA >60 01/13/2020 0929   Lipase     Component Value Date/Time   LIPASE 39 12/01/2023 1255    Studies/Results: US  Abdomen Limited RUQ (LIVER/GB) Result Date: 12/01/2023 CLINICAL DATA:  Right upper quadrant pain. EXAM: ULTRASOUND ABDOMEN LIMITED RIGHT UPPER QUADRANT COMPARISON:  December 09, 2022 FINDINGS: Gallbladder: Multiple shadowing echogenic gallstones are seen within the gallbladder lumen. The largest measures approximately 1.7 cm. Cholesterol polyps are also suspected within the gallbladder lumen. The gallbladder wall is thickened (5.2 mm) and edematous.  A positive sonographic Beverley sign is noted by sonographer. Common bile duct: Diameter: 3.9 mm Liver: No focal lesion identified. Within normal limits in parenchymal echogenicity. Portal vein is patent on color Doppler imaging with normal direction of blood flow towards the liver. Other: None. IMPRESSION: 1. Cholelithiasis with additional findings consistent with acute cholecystitis. 2. Cholesterol polyps suspected within the gallbladder lumen. Electronically Signed   By: Suzen Dials M.D.   On: 12/01/2023 16:52    Anti-infectives: Anti-infectives (From admission, onward)    Start     Dose/Rate Route Frequency Ordered Stop   12/02/23 1000  metroNIDAZOLE   (FLAGYL ) tablet 500 mg        500 mg Oral Every 12 hours 12/01/23 2059     12/02/23 0600  ceFEPIme  (MAXIPIME ) 2 g in sodium chloride  0.9 % 100 mL IVPB  Status:  Discontinued        2 g 200 mL/hr over 30 Minutes Intravenous Every 12 hours 12/01/23 2104 12/02/23 1101   12/01/23 1800  ceFEPIme  (MAXIPIME ) 2 g in sodium chloride  0.9 % 100 mL IVPB       Placed in And Linked Group   2 g 200 mL/hr over 30 Minutes Intravenous  Once 12/01/23 1757 12/01/23 1859   12/01/23 1800  metroNIDAZOLE  (FLAGYL ) IVPB 500 mg       Placed in And Linked Group   500 mg 100 mL/hr over 60 Minutes Intravenous  Once 12/01/23 1757 12/01/23 2111        Assessment/Plan POD 1 s/p laparoscopic cholecystectomy by Dr. Stevie on 12/02/23 for acute cholecystitis  - LFT's downtrending. T. Bili 1.3 from 1.9. Alk Phos wnl. Monitor - WBC 35.9. Afebrile. No tachycardia or hypotension. No peritonitis on exam. Monitor. Hx of Myelodysplastic syndrome. No further abx needed from our standpoint - Okay for [facility] diet - Multimodal pain control, adjust - Mobilize, pulm toilet - Possible d/c tomorrow if doing well  FEN - HH, IVF per [facility] VTE - SCDs, okay for chem ppx from a general surgery standpoint ID - Cefepime /Flagyl  8/22 - 8/23 Foley - None, spont void Plan - As above.    LOS: 0 days    Ozell CHRISTELLA Shaper, [facility] Surgery 12/03/2023, 9:10 AM Please see Amion for pager number during day hours 7:00am-4:30pm

## 2023-12-03 NOTE — Progress Notes (Addendum)
 [facility] Progress Note Patient: Roy Johnson FMW:[id number] DOB: 11-Jul-1945 DOA: 12/01/2023  DOS: the patient was seen and examined on 12/03/2023  Brief Hospital Course: [name] is a 78 y.o. male with a history of MDS, essential thrombocythemia, chronic diarrhea, prostate CA who presented to the ED with upper abdominal pain focused in RUQ radiating to the chest, constant, waxing/waning associated with nausea and poor appetite. He had presented to his PCP where LFTs were found to be elevated, so he was referred to the ED. In the ED, U/S confirmed findings suggestive of acute cholecystitis. Antibiotics given, surgery consulted, and medicine admission requested.  Underwent lap chole. Assessment and Plan: Acute calculous cholecystitis:  CBD appears normal on U/S.  Surgery consulted. Underwent laparoscopic cholecystectomy 8/23 with Dr. Stevie.  No IOC performed. Monitor postop recovery. LFT improving. Antibiotic now discontinued postoperatively.   Myelodysplastic syndrome, JAK2-positive  essential thrombocythemia,  chronic anemia with history of transfusions:  Patient sees Dr. Sherrod Does not take Revlimid right now. On anagrelide , resume twice daily. Also receives EPO injections. Transfuse for hemoglobin less than 7. Due to anemia holding VTE prophylaxis chemically.  SCDs.   Hyperkalemia:  Resolved with fluid, Lokelma , calcium  gluconate   DNR: POA. Discussed with pt/daughter at bedside.  No limitations on treatment desired unless case of cardiac/respiratory arrest.  Headache. No focal deficit on exam. Will increase pain medication regimen. Monitor.  Mild renal insufficiency. Renal function worsened after IV fluid. For now we will monitor    Subjective: Denies any acute complaint.  No nausea or vomiting.  Headache resolved. Abdominal pain still with related to muscular spasms.  Passing gas but no BM. Physical Exam: Clear to auscultation. S1-S2 present bowel  sound present but Diffuse right-sided tenderness with No edema.  Data Reviewed: I have Reviewed nursing notes, Vitals, and Lab results. Since last encounter, pertinent lab results CBC and BMP   . I have ordered test including CBC and BMP  .  Discussed with general surgery.  Disposition: Status is: Inpatient Remains inpatient appropriate because: Monitor for improvement in abdominal pain and leukocytosis worsening renal function.  Place and maintain sequential compression device Start: 12/03/23 0834 Place TED hose Start: 12/03/23 0834   Family Communication: No one at bedside Level of care: Med-Surg   Vitals:   12/03/23 0000 12/03/23 0337 12/03/23 0850 12/03/23 1612  BP: 135/82 (!) 132/59 128/70 125/60  Pulse: 81 74 80 89  Resp: 18 17 15 17   Temp: 98.4 F (36.9 C) 98.4 F (36.9 C) 98.4 F (36.9 C) 98.6 F (37 C)  TempSrc: Oral Oral Oral Oral  SpO2: 99% 90% 93% 92%  Weight:      Height:         Author: Yetta Blanch, MD 12/03/2023 6:46 PM  Please look on [email or web address] to find out who is on call.

## 2023-12-04 ENCOUNTER — Encounter (HOSPITAL_COMMUNITY): Payer: Self-pay | Admitting: General Surgery

## 2023-12-04 DIAGNOSIS — K810 Acute cholecystitis: Secondary | ICD-10-CM | POA: Diagnosis not present

## 2023-12-04 LAB — COMPREHENSIVE METABOLIC PANEL WITH GFR
ALT: 140 U/L — ABNORMAL HIGH (ref 0–44)
AST: 39 U/L (ref 15–41)
Albumin: 3.1 g/dL — ABNORMAL LOW (ref 3.5–5.0)
Alkaline Phosphatase: 104 U/L (ref 38–126)
Anion gap: 9 (ref 5–15)
BUN: 15 mg/dL (ref 8–23)
CO2: 25 mmol/L (ref 22–32)
Calcium: 8.8 mg/dL — ABNORMAL LOW (ref 8.9–10.3)
Chloride: 104 mmol/L (ref 98–111)
Creatinine, Ser: 1.15 mg/dL (ref 0.61–1.24)
GFR, Estimated: >60 mL/min (ref >60)
Glucose, Bld: 75 mg/dL (ref 70–99)
Potassium: 3.8 mmol/L (ref 3.5–5.1)
Sodium: 138 mmol/L (ref 135–145)
Total Bilirubin: 0.9 mg/dL (ref 0.0–1.2)
Total Protein: 6 g/dL — ABNORMAL LOW (ref 6.5–8.1)

## 2023-12-04 LAB — CBC WITH DIFFERENTIAL/PLATELET
Basophils Absolute: 0.6 K/uL — ABNORMAL HIGH (ref 0.0–0.1)
Basophils Relative: 2 %
Eosinophils Absolute: 0.9 K/uL — ABNORMAL HIGH (ref 0.0–0.5)
Eosinophils Relative: 3 %
HCT: 26.5 % — ABNORMAL LOW (ref 39.0–52.0)
Hemoglobin: 8.2 g/dL — ABNORMAL LOW (ref 13.0–17.0)
Lymphocytes Relative: 10 %
Lymphs Abs: 2.9 K/uL (ref 0.7–4.0)
MCH: 26.5 pg (ref 26.0–34.0)
MCHC: 30.9 g/dL (ref 30.0–36.0)
MCV: 85.5 fL (ref 80.0–100.0)
Monocytes Absolute: 0.3 K/uL (ref 0.1–1.0)
Monocytes Relative: 1 %
Neutro Abs: 24.5 K/uL — ABNORMAL HIGH (ref 1.7–7.7)
Neutrophils Relative %: 84 %
Platelets: 278 K/uL (ref 150–400)
RBC: 3.1 MIL/uL — ABNORMAL LOW (ref 4.22–5.81)
RDW: 33.3 % — ABNORMAL HIGH (ref 11.5–15.5)
WBC: 29.2 K/uL — ABNORMAL HIGH (ref 4.0–10.5)
nRBC: 0.1 % (ref 0.0–0.2)

## 2023-12-04 LAB — MAGNESIUM: Magnesium: 1.8 mg/dL (ref 1.7–2.4)

## 2023-12-04 MED ORDER — ACETAMINOPHEN 325 MG PO TABS: 650.0000 mg | ORAL_TABLET | Freq: Four times a day (QID) | ORAL | Status: AC | PRN

## 2023-12-04 MED ORDER — FERROUS SULFATE 325 (65 FE) MG PO TBEC: 325.0000 mg | DELAYED_RELEASE_TABLET | Freq: Every day | ORAL | 0 refills | Status: AC

## 2023-12-04 MED ORDER — DOCUSATE SODIUM 100 MG PO CAPS: 100.0000 mg | ORAL_CAPSULE | Freq: Two times a day (BID) | ORAL | 0 refills | Status: AC | PRN

## 2023-12-04 MED ORDER — ONDANSETRON HCL 4 MG PO TABS: 4.0000 mg | ORAL_TABLET | Freq: Three times a day (TID) | ORAL | 0 refills | Status: AC | PRN

## 2023-12-04 MED ORDER — METHOCARBAMOL 500 MG PO TABS: 500.0000 mg | ORAL_TABLET | Freq: Three times a day (TID) | ORAL | 0 refills | Status: AC | PRN

## 2023-12-04 MED ORDER — OXYCODONE HCL 5 MG PO TABS: 5.0000 mg | ORAL_TABLET | Freq: Four times a day (QID) | ORAL | 0 refills | Status: AC | PRN

## 2023-12-04 MED ORDER — DM-GUAIFENESIN ER 30-600 MG PO TB12: 1.0000 | ORAL_TABLET | Freq: Two times a day (BID) | ORAL | 0 refills | Status: AC

## 2023-12-04 MED ORDER — FLUTICASONE PROPIONATE 50 MCG/ACT NA SUSP: 1.0000 | Freq: Every day | NASAL | 0 refills | Status: AC

## 2023-12-04 NOTE — Discharge Instructions (Signed)

## 2023-12-04 NOTE — Plan of Care (Signed)

## 2023-12-04 NOTE — Progress Notes (Addendum)
 Pt verbalized understanding of d/c POC .  PIV removed by NT. Patient showering and then will transfer to D/C lounge and call ride.   D/C lounge notified.

## 2023-12-04 NOTE — Progress Notes (Signed)
 2 Days Post-Op  Subjective: CC: Pain that appears mainly around his incision going towards his R flank is more tolerable today and better controlled. Tolerating diet without n/v. Voiding. BM yesterday. Mobilizing.   Afebrile. No tachycardia or systolic hypotension. LFT's downtrending. He reports he has outpatient labs scheduled for tomorrow.   Objective: Vital signs in last 24 hours: Temp:  [98.3 F (36.8 C)-98.6 F (37 C)] 98.3 F (36.8 C) (08/25 0509) Pulse Rate:  [80-93] 81 (08/25 0509) Resp:  [15-17] 16 (08/24 1949) BP: (122-135)/(57-70) 122/59 (08/25 0509) SpO2:  [92 %-95 %] 92 % (08/25 0509) Last BM Date : 12/03/23  Intake/Output from previous day: 08/24 0701 - 08/25 0700 In: 3187.3 [P.O.:610; I.V.:2577.3] Out: -  Intake/Output this shift: No intake/output data recorded.  PE: Gen:  Alert, NAD, pleasant Pulm:  Rate and effort normal Abd: Soft, no distension, appropriately tender around laparoscopic incisions, no rigidity or guarding and otherwise NT. Umbilical incisions with gauze and tegarderm in place, cdi. R laparoscopic incision with dressing in place, cdi - taken down, wound hemostatic. Remaninig incisions with glue intact appears well and are without drainage, bleeding, or signs of infection.   Lab Results:  Recent Labs    12/03/23 0530 12/03/23 1139  WBC 35.9* 32.7*  HGB 9.5* 9.0*  HCT 31.3* 28.7*  PLT 284 263   BMET Recent Labs    12/03/23 0530 12/04/23 0650  NA 137 138  K 4.1 3.8  CL 104 104  CO2 23 25  GLUCOSE 62* 75  BUN 18 15  CREATININE 1.41* 1.15  CALCIUM  8.9 8.8*   PT/INR Recent Labs    12/02/23 0701  LABPROT 15.1  INR 1.1   CMP     Component Value Date/Time   NA 138 12/04/2023 0650   NA 141 12/08/2016 0928   K 3.8 12/04/2023 0650   K 4.5 12/08/2016 0928   CL 104 12/04/2023 0650   CO2 25 12/04/2023 0650   CO2 27 12/08/2016 0928   GLUCOSE 75 12/04/2023 0650   GLUCOSE 150 (H) 12/08/2016 0928   BUN 15 12/04/2023 0650    BUN 14.1 12/08/2016 0928   CREATININE 1.15 12/04/2023 0650   CREATININE 1.36 (H) 11/28/2023 0846   CREATININE 0.9 12/08/2016 0928   CALCIUM  8.8 (L) 12/04/2023 0650   CALCIUM  9.0 12/08/2016 0928   PROT 6.0 (L) 12/04/2023 0650   PROT 7.3 04/01/2020 0954   PROT 6.6 12/08/2016 0928   ALBUMIN 3.1 (L) 12/04/2023 0650   ALBUMIN 3.7 12/08/2016 0928   AST 39 12/04/2023 0650   AST 11 (L) 11/28/2023 0846   AST 25 12/08/2016 0928   ALT 140 (H) 12/04/2023 0650   ALT 11 11/28/2023 0846   ALT 41 12/08/2016 0928   ALKPHOS 104 12/04/2023 0650   ALKPHOS 87 12/08/2016 0928   BILITOT 0.9 12/04/2023 0650   BILITOT 0.5 11/28/2023 0846   BILITOT 0.84 12/08/2016 0928   GFRNONAA >60 12/04/2023 0650   GFRNONAA 53 (L) 11/28/2023 0846   GFRAA >60 01/13/2020 0929   Lipase     Component Value Date/Time   LIPASE 39 12/01/2023 1255    Studies/Results: No results found.   Anti-infectives: Anti-infectives (From admission, onward)    Start     Dose/Rate Route Frequency Ordered Stop   12/02/23 1000  metroNIDAZOLE  (FLAGYL ) tablet 500 mg  Status:  Discontinued        500 mg Oral Every 12 hours 12/01/23 2059 12/03/23 1030   12/02/23 0600  ceFEPIme  (  MAXIPIME ) 2 g in sodium chloride  0.9 % 100 mL IVPB  Status:  Discontinued        2 g 200 mL/hr over 30 Minutes Intravenous Every 12 hours 12/01/23 2104 12/02/23 1101   12/01/23 1800  ceFEPIme  (MAXIPIME ) 2 g in sodium chloride  0.9 % 100 mL IVPB       Placed in And Linked Group   2 g 200 mL/hr over 30 Minutes Intravenous  Once 12/01/23 1757 12/01/23 1859   12/01/23 1800  metroNIDAZOLE  (FLAGYL ) IVPB 500 mg       Placed in And Linked Group   500 mg 100 mL/hr over 60 Minutes Intravenous  Once 12/01/23 1757 12/01/23 2111        Assessment/Plan POD 2 s/p laparoscopic cholecystectomy by Dr. Stevie on 12/02/23 for acute cholecystitis  - LFT's downtrending. T. Bili and Alk Phos wnl.  - No further abx needed from our standpoint - Hgb 11 --> 9.5 --> 9.  HDS without tachycardia or systolic hypotension. Discussed with attending. He does not need further hgb checks before d/c. He is scheduled for outpatient labs tomorrow - Cont HH diet - Mobilize, pulm toilet - Okay for discharge from our standpoint. Will arrange f/u and send pain meds to his pharmacy. Discussed discharge instructions, restrictions and return/call back precautions. Will send a message to [facility].   FEN - HH, IVF per [facility] VTE - SCDs, anagrelide  ID - Cefepime /Flagyl  8/22 - 8/23 Foley - None, spont void Plan - As above.    LOS: 1 day    Ozell CHRISTELLA Shaper, [facility] Surgery 12/04/2023, 8:46 AM Please see Amion for pager number during day hours 7:00am-4:30pm

## 2023-12-05 ENCOUNTER — Telehealth: Payer: Self-pay | Admitting: Medical Oncology

## 2023-12-05 ENCOUNTER — Inpatient Hospital Stay

## 2023-12-05 VITALS — BP 145/80 | HR 74 | Temp 97.7°F | Resp 16

## 2023-12-05 DIAGNOSIS — D469 Myelodysplastic syndrome, unspecified: Secondary | ICD-10-CM

## 2023-12-05 DIAGNOSIS — L7632 Postprocedural hematoma of skin and subcutaneous tissue following other procedure: Secondary | ICD-10-CM | POA: Diagnosis not present

## 2023-12-05 DIAGNOSIS — G8918 Other acute postprocedural pain: Secondary | ICD-10-CM | POA: Diagnosis not present

## 2023-12-05 LAB — CBC WITH DIFFERENTIAL (CANCER CENTER ONLY)
Abs Immature Granulocytes: 2.94 K/uL — ABNORMAL HIGH (ref 0.00–0.07)
Basophils Absolute: 0.3 K/uL — ABNORMAL HIGH (ref 0.0–0.1)
Basophils Relative: 1 %
Eosinophils Absolute: 0.8 K/uL — ABNORMAL HIGH (ref 0.0–0.5)
Eosinophils Relative: 2 %
HCT: 28.4 % — ABNORMAL LOW (ref 39.0–52.0)
Hemoglobin: 9 g/dL — ABNORMAL LOW (ref 13.0–17.0)
Immature Granulocytes: 9 %
Lymphocytes Relative: 6 %
Lymphs Abs: 2 K/uL (ref 0.7–4.0)
MCH: 26.7 pg (ref 26.0–34.0)
MCHC: 31.7 g/dL (ref 30.0–36.0)
MCV: 84.3 fL (ref 80.0–100.0)
Monocytes Absolute: 1.2 K/uL — ABNORMAL HIGH (ref 0.1–1.0)
Monocytes Relative: 3 %
Neutro Abs: 27.1 K/uL — ABNORMAL HIGH (ref 1.7–7.7)
Neutrophils Relative %: 79 %
Platelet Count: 338 K/uL (ref 150–400)
RBC: 3.37 MIL/uL — ABNORMAL LOW (ref 4.22–5.81)
RDW: 33.4 % — ABNORMAL HIGH (ref 11.5–15.5)
WBC Count: 34.3 K/uL — ABNORMAL HIGH (ref 4.0–10.5)
nRBC: 0.1 % (ref 0.0–0.2)

## 2023-12-05 LAB — CMP (CANCER CENTER ONLY)
ALT: 100 U/L — ABNORMAL HIGH (ref 0–44)
AST: 26 U/L (ref 15–41)
Albumin: 4.1 g/dL (ref 3.5–5.0)
Alkaline Phosphatase: 108 U/L (ref 38–126)
Anion gap: 4 — ABNORMAL LOW (ref 5–15)
BUN: 18 mg/dL (ref 8–23)
CO2: 32 mmol/L (ref 22–32)
Calcium: 9.3 mg/dL (ref 8.9–10.3)
Chloride: 108 mmol/L (ref 98–111)
Creatinine: 1.32 mg/dL — ABNORMAL HIGH (ref 0.61–1.24)
GFR, Estimated: 55 mL/min — ABNORMAL LOW (ref >60)
Glucose, Bld: 99 mg/dL (ref 70–99)
Potassium: 4.8 mmol/L (ref 3.5–5.1)
Sodium: 144 mmol/L (ref 135–145)
Total Bilirubin: 0.6 mg/dL (ref 0.0–1.2)
Total Protein: 6.7 g/dL (ref 6.5–8.1)

## 2023-12-05 LAB — SURGICAL PATHOLOGY

## 2023-12-05 MED ORDER — EPOETIN ALFA-EPBX 40000 UNIT/ML IJ SOLN
40000.0000 [IU] | Freq: Once | INTRAMUSCULAR | Status: AC
  Administered 2023-12-05: 40000 [IU] via SUBCUTANEOUS
  Filled 2023-12-05: qty 1

## 2023-12-05 NOTE — Telephone Encounter (Signed)
 Roy Johnson reported his hgb today is 9.0.

## 2023-12-05 NOTE — Discharge Summary (Signed)
 Physician Discharge Summary   Patient: Roy Johnson MRN: [id number] DOB: Aug 26, 1945  Admit date:     12/01/2023  Discharge date: 12/04/2023  Discharge Physician: Roy Johnson  PCP: Roy Johnson  Recommendations at discharge: Follow up with PCP in 1 week Follow up with Oncology recommended  Follow up with General surgery as recommended   Follow-up Information     Roy Johnson Follow up.   Specialty: Internal Medicine Contact information: [address] [place] [address] [phone]         Roy Johnson, [place] Follow up.   Specialty: General Surgery Why: Please call to confirm your appointment date and time. Please bring a copy of your photo ID, insurance card and arrive 30 minutes prior to your appointment for paperwork. Contact information: [address] [place] [address] [phone]         Roy Johnson Follow up.   Specialty: Oncology Why: the Appointment As Scheduled Contact information: [address] [place] [address] [phone]                Discharge Diagnoses: Principal Problem:   Acute cholecystitis Active Problems:   H/O prostate cancer   GERD (gastroesophageal reflux disease)   Leukocytosis   Essential thrombocythemia (HCC)   MDS (myelodysplastic syndrome) (HCC)   Cholecystitis  Hospital Course: Roy Johnson is a 78 y.o. male with a history of MDS, essential thrombocythemia, chronic diarrhea, prostate CA who presented to the ED with upper abdominal pain focused in RUQ radiating to the chest, constant, waxing/waning associated with nausea and poor appetite. He had presented to his PCP where LFTs were found to be elevated, so he was referred to the ED. In the ED, U/S confirmed findings suggestive of acute cholecystitis. Antibiotics given, surgery consulted, and medicine admission requested.  Underwent lap chole. Assessment and Plan: Acute calculous cholecystitis:  CBD  appears normal on U/S.  Surgery consulted. Underwent laparoscopic cholecystectomy 8/23 with Dr. Stevie.  No IOC performed. Monitor postop recovery. LFT improving. Antibiotic now discontinued postoperatively. Per General surgery pt ready for discharge.  Tolerating oral diet.    Myelodysplastic syndrome, JAK2-positive  essential thrombocythemia,  chronic anemia with history of transfusions:  Patient sees Dr. Sherrod Does not take Revlimid right now. On anagrelide , resume twice daily. Also receives EPO injections. D/w Oncology. Pt has close follow up.    Hyperkalemia:  Resolved with fluid, Lokelma , calcium  gluconate   DNR: POA. Discussed with pt/daughter at bedside.  No limitations on treatment desired unless case of cardiac/respiratory arrest.  Headache. No focal deficit on exam. Resolved.   Mild renal insufficiency. Renal function worsened after IV fluid. For now we will monitor   Nasal stuffiness.  Add inhalers and mucinex .   Consultants:  General surgery   Procedures performed:  Lap chole  DISCHARGE MEDICATION: Allergies as of 12/04/2023   No Known Allergies      Medication List     PAUSE taking these medications    cholestyramine  4 g packet Wait to take this until your doctor or other care provider tells you to start again. Until you start having diarrhea again Commonly known as: QUESTRAN  Take 1 packet (4 g total) by mouth 2 (two) times daily.       STOP taking these medications    amoxicillin  500 MG tablet Commonly known as: AMOXIL        TAKE these medications    acetaminophen  325 MG tablet Commonly known  as: TYLENOL  Take 2 tablets (650 mg total) by mouth every 6 (six) hours as needed for mild pain (pain score 1-3), fever or headache.   amLODipine  5 MG tablet Commonly known as: NORVASC  Take 5 mg by mouth at bedtime.   anagrelide  1 MG capsule Commonly known as: AGRYLIN TAKE 1 CAPSULE(1 MG) BY MOUTH DAILY What changed: See the new  instructions.   cetirizine 10 MG tablet Commonly known as: ZYRTEC Take 10 mg by mouth daily as needed for allergies.   dextromethorphan-guaiFENesin  30-600 MG 12hr tablet Commonly known as: MUCINEX  DM Take 1 tablet by mouth 2 (two) times daily for 7 days.   docusate sodium  100 MG capsule Commonly known as: COLACE Take 1 capsule (100 mg total) by mouth 2 (two) times daily as needed for mild constipation.   epoetin  alfa-epbx 40000 UNIT/ML injection Commonly known as: RETACRIT  Inject 1 mL into the skin once a week.   ferrous sulfate  325 (65 FE) MG EC tablet Take 1 tablet (325 mg total) by mouth daily with breakfast.   fluticasone  50 MCG/ACT nasal spray Commonly known as: FLONASE  Place 1 spray into both nostrils daily for 7 days. What changed:  when to take this reasons to take this   gabapentin  100 MG capsule Commonly known as: NEURONTIN  TAKE 1 CAPSULE BY MOUTH TWICE DAILY AND 2 CAPSULES BY MOUTH EVERY EVENING What changed:  how much to take how to take this when to take this additional instructions   hyoscyamine  0.125 MG SL tablet Commonly known as: LEVSIN  SL Take 1 tablet (0.125 mg total) by mouth every 6 (six) hours as needed.   ipratropium 0.06 % nasal spray Commonly known as: ATROVENT Place 1 spray into both nostrils daily as needed for rhinitis.   luspatercept -aamt 25 MG subcutaneous injection Commonly known as: REBLOZYL  Inject 25 mg into the skin See admin instructions. Inject 25mg  into the skin every 3 weeks.   methocarbamol  500 MG tablet Commonly known as: ROBAXIN  Take 1 tablet (500 mg total) by mouth every 8 (eight) hours as needed for muscle spasms.   Multivitamin Men 50+ Tabs Take 1 tablet by mouth daily.   ondansetron  4 MG tablet Commonly known as: ZOFRAN  Take 1 tablet (4 mg total) by mouth every 8 (eight) hours as needed for nausea or vomiting.   oxyCODONE  5 MG immediate release tablet Commonly known as: Oxy IR/ROXICODONE  Take 1 tablet (5 mg  total) by mouth every 6 (six) hours as needed for breakthrough pain.               Discharge Care Instructions  (From admission, onward)           Start     Ordered   12/04/23 0000  No dressing needed        12/04/23 0955           Disposition: Home Diet recommendation: Cardiac diet  Discharge Exam: Vitals:   12/03/23 1612 12/03/23 1949 12/04/23 0509 12/04/23 1023  BP: 125/60 (!) 135/57 (!) 122/59 (!) 140/64  Pulse: 89 93 81 82  Resp: 17 16    Temp: 98.6 F (37 C)  98.3 F (36.8 C) 98.1 F (36.7 C)  TempSrc: Oral     SpO2: 92% 95% 92% 94%  Weight:      Height:       General: in Mild distress, No Rash Cardiovascular: S1 and S2 Present, No Murmur Respiratory: Good respiratory effort, Bilateral Air entry present. No Crackles, No wheezes Abdomen: Bowel Sound  present, surgical site  tenderness Extremities: No edema Neuro: Alert and oriented x3, no new focal deficit   Filed Weights   12/01/23 1244 12/02/23 1127  Weight: 67.6 kg 95.3 kg   Condition at discharge: stable  The results of significant diagnostics from this hospitalization (including imaging, microbiology, ancillary and laboratory) are listed below for reference.   Imaging Studies: US  Abdomen Limited RUQ (LIVER/GB) Result Date: 12/01/2023 CLINICAL DATA:  Right upper quadrant pain. EXAM: ULTRASOUND ABDOMEN LIMITED RIGHT UPPER QUADRANT COMPARISON:  December 09, 2022 FINDINGS: Gallbladder: Multiple shadowing echogenic gallstones are seen within the gallbladder lumen. The largest measures approximately 1.7 cm. Cholesterol polyps are also suspected within the gallbladder lumen. The gallbladder wall is thickened (5.2 mm) and edematous. A positive sonographic Beverley sign is noted by sonographer. Common bile duct: Diameter: 3.9 mm Liver: No focal lesion identified. Within normal limits in parenchymal echogenicity. Portal vein is patent on color Doppler imaging with normal direction of blood flow towards the  liver. Other: None. IMPRESSION: 1. Cholelithiasis with additional findings consistent with acute cholecystitis. 2. Cholesterol polyps suspected within the gallbladder lumen. Electronically Signed   By: Suzen Dials M.D.   On: 12/01/2023 16:52    Microbiology: Results for orders placed or performed during the hospital encounter of 12/01/23  Surgical PCR screen     Status: None   Collection Time: 12/01/23  8:23 PM   Specimen: Nasal Mucosa; Nasal Swab  Result Value Ref Range Status   MRSA, PCR NEGATIVE NEGATIVE Final   Staphylococcus aureus NEGATIVE NEGATIVE Final    Comment: (NOTE) The Xpert SA Assay (FDA approved for NASAL specimens in patients 38 years of age and older), is one component of a comprehensive surveillance program. It is not intended to diagnose infection nor to guide or monitor treatment. Performed at [facility], [address] [place], [place] [address]    *Note: Due to a large number of results and/or encounters for the requested time period, some results have not been displayed. A complete set of results can be found in Results Review.   Labs: CBC: Recent Labs  Lab 12/02/23 0701 12/03/23 0530 12/03/23 1139 12/04/23 0650  WBC 25.1* 35.9* 32.7* 29.2*  NEUTROABS  --   --  27.0* 24.5*  HGB 11.0* 9.5* 9.0* 8.2*  HCT 34.9* 31.3* 28.7* 26.5*  MCV 84.7 87.4 85.9 85.5  PLT 272 284 263 278   Basic Metabolic Panel: Recent Labs  Lab 12/01/23 1255 12/02/23 0701 12/03/23 0530 12/04/23 0650  NA 138 140 137 138  K 5.5* 4.3 4.1 3.8  CL 105 111 104 104  CO2 24 24 23 25   GLUCOSE 112* 94 62* 75  BUN 25* 19 18 15   CREATININE 1.24 1.30* 1.41* 1.15  CALCIUM  10.2 9.8 8.9 8.8*  MG  --   --  1.9 1.8   Liver Function Tests: Recent Labs  Lab 12/01/23 1255 12/02/23 0701 12/03/23 0530 12/04/23 0650  AST 458* 163* 67* 39  ALT 584* 395* 236* 140*  ALKPHOS 124 141* 119 104  BILITOT 3.5* 1.9* 1.3* 0.9  PROT 8.2* 7.4 6.5 6.0*  ALBUMIN 4.5 4.0 3.3* 3.1*    CBG: No results for input(s): GLUCAP in the last 168 hours.  Discharge time spent: greater than 30 minutes.  Author: Yetta Blanch, Johnson  [facility] 12/04/2023

## 2023-12-06 ENCOUNTER — Encounter: Payer: Self-pay | Admitting: Internal Medicine

## 2023-12-06 NOTE — Progress Notes (Signed)
 Patient received Luspatercept  68 mg Sub-Q in LUA at patient request.. Tolerated well. BandAid Applied. Patient education provided.

## 2023-12-07 ENCOUNTER — Inpatient Hospital Stay (HOSPITAL_COMMUNITY)
Admission: EM | Admit: 2023-12-07 | Discharge: 2023-12-09 | DRG: 920 | Disposition: A | Discharge status: Home / Self Care | Attending: Surgery | Admitting: Surgery

## 2023-12-07 ENCOUNTER — Ambulatory Visit (HOSPITAL_BASED_OUTPATIENT_CLINIC_OR_DEPARTMENT_OTHER)
Admission: RE | Admit: 2023-12-07 | Discharge: 2023-12-07 | Disposition: A | Discharge status: Home / Self Care | Source: Ambulatory Visit | Attending: Student | Admitting: Student

## 2023-12-07 ENCOUNTER — Telehealth: Payer: Self-pay | Admitting: Medical Oncology

## 2023-12-07 ENCOUNTER — Other Ambulatory Visit (HOSPITAL_COMMUNITY): Payer: Self-pay | Admitting: Student

## 2023-12-07 ENCOUNTER — Encounter (HOSPITAL_COMMUNITY): Payer: Self-pay

## 2023-12-07 ENCOUNTER — Other Ambulatory Visit: Payer: Self-pay

## 2023-12-07 DIAGNOSIS — Z8546 Personal history of malignant neoplasm of prostate: Secondary | ICD-10-CM

## 2023-12-07 DIAGNOSIS — Z801 Family history of malignant neoplasm of trachea, bronchus and lung: Secondary | ICD-10-CM

## 2023-12-07 DIAGNOSIS — K529 Noninfective gastroenteritis and colitis, unspecified: Secondary | ICD-10-CM | POA: Diagnosis present

## 2023-12-07 DIAGNOSIS — R61 Generalized hyperhidrosis: Secondary | ICD-10-CM | POA: Diagnosis present

## 2023-12-07 DIAGNOSIS — D75839 Thrombocytosis, unspecified: Secondary | ICD-10-CM | POA: Diagnosis present

## 2023-12-07 DIAGNOSIS — D471 Chronic myeloproliferative disease: Secondary | ICD-10-CM | POA: Diagnosis present

## 2023-12-07 DIAGNOSIS — Z79899 Other long term (current) drug therapy: Secondary | ICD-10-CM

## 2023-12-07 DIAGNOSIS — G8918 Other acute postprocedural pain: Principal | ICD-10-CM | POA: Diagnosis present

## 2023-12-07 DIAGNOSIS — Z9089 Acquired absence of other organs: Secondary | ICD-10-CM

## 2023-12-07 DIAGNOSIS — Z803 Family history of malignant neoplasm of breast: Secondary | ICD-10-CM

## 2023-12-07 DIAGNOSIS — Z9049 Acquired absence of other specified parts of digestive tract: Secondary | ICD-10-CM | POA: Insufficient documentation

## 2023-12-07 DIAGNOSIS — Z808 Family history of malignant neoplasm of other organs or systems: Secondary | ICD-10-CM

## 2023-12-07 DIAGNOSIS — E785 Hyperlipidemia, unspecified: Secondary | ICD-10-CM | POA: Diagnosis present

## 2023-12-07 DIAGNOSIS — L7632 Postprocedural hematoma of skin and subcutaneous tissue following other procedure: Principal | ICD-10-CM | POA: Diagnosis present

## 2023-12-07 DIAGNOSIS — R053 Chronic cough: Secondary | ICD-10-CM | POA: Diagnosis present

## 2023-12-07 DIAGNOSIS — Z8249 Family history of ischemic heart disease and other diseases of the circulatory system: Secondary | ICD-10-CM

## 2023-12-07 DIAGNOSIS — L03316 Cellulitis of umbilicus: Secondary | ICD-10-CM | POA: Diagnosis present

## 2023-12-07 DIAGNOSIS — Z7961 Long term (current) use of immunomodulator: Secondary | ICD-10-CM

## 2023-12-07 DIAGNOSIS — I129 Hypertensive chronic kidney disease with stage 1 through stage 4 chronic kidney disease, or unspecified chronic kidney disease: Secondary | ICD-10-CM | POA: Diagnosis present

## 2023-12-07 DIAGNOSIS — D72829 Elevated white blood cell count, unspecified: Secondary | ICD-10-CM | POA: Diagnosis present

## 2023-12-07 DIAGNOSIS — T451X5A Adverse effect of antineoplastic and immunosuppressive drugs, initial encounter: Secondary | ICD-10-CM | POA: Diagnosis present

## 2023-12-07 DIAGNOSIS — H9190 Unspecified hearing loss, unspecified ear: Secondary | ICD-10-CM | POA: Diagnosis present

## 2023-12-07 DIAGNOSIS — Z860100 Personal history of colon polyps, unspecified: Secondary | ICD-10-CM

## 2023-12-07 DIAGNOSIS — Z9079 Acquired absence of other genital organ(s): Secondary | ICD-10-CM

## 2023-12-07 DIAGNOSIS — Z8042 Family history of malignant neoplasm of prostate: Secondary | ICD-10-CM

## 2023-12-07 DIAGNOSIS — D473 Essential (hemorrhagic) thrombocythemia: Secondary | ICD-10-CM | POA: Diagnosis present

## 2023-12-07 DIAGNOSIS — G620 Drug-induced polyneuropathy: Secondary | ICD-10-CM | POA: Diagnosis present

## 2023-12-07 DIAGNOSIS — N183 Chronic kidney disease, stage 3 unspecified: Secondary | ICD-10-CM | POA: Diagnosis present

## 2023-12-07 DIAGNOSIS — Z83719 Family history of colon polyps, unspecified: Secondary | ICD-10-CM

## 2023-12-07 DIAGNOSIS — Z87891 Personal history of nicotine dependence: Secondary | ICD-10-CM

## 2023-12-07 DIAGNOSIS — R0981 Nasal congestion: Secondary | ICD-10-CM | POA: Diagnosis present

## 2023-12-07 DIAGNOSIS — Z86018 Personal history of other benign neoplasm: Secondary | ICD-10-CM

## 2023-12-07 DIAGNOSIS — S301XXA Contusion of abdominal wall, initial encounter: Secondary | ICD-10-CM

## 2023-12-07 DIAGNOSIS — Y838 Other surgical procedures as the cause of abnormal reaction of the patient, or of later complication, without mention of misadventure at the time of the procedure: Secondary | ICD-10-CM | POA: Diagnosis present

## 2023-12-07 DIAGNOSIS — R110 Nausea: Secondary | ICD-10-CM | POA: Diagnosis present

## 2023-12-07 DIAGNOSIS — D461 Refractory anemia with ring sideroblasts: Secondary | ICD-10-CM | POA: Diagnosis present

## 2023-12-07 LAB — COMPREHENSIVE METABOLIC PANEL WITH GFR
ALT: 58 U/L — ABNORMAL HIGH (ref 0–44)
AST: 24 U/L (ref 15–41)
Albumin: 4.3 g/dL (ref 3.5–5.0)
Alkaline Phosphatase: 128 U/L — ABNORMAL HIGH (ref 38–126)
Anion gap: 15 (ref 5–15)
BUN: 14 mg/dL (ref 8–23)
CO2: 22 mmol/L (ref 22–32)
Calcium: 9.4 mg/dL (ref 8.9–10.3)
Chloride: 101 mmol/L (ref 98–111)
Creatinine, Ser: 1.14 mg/dL (ref 0.61–1.24)
GFR, Estimated: >60 mL/min (ref >60)
Glucose, Bld: 88 mg/dL (ref 70–99)
Potassium: 4.3 mmol/L (ref 3.5–5.1)
Sodium: 138 mmol/L (ref 135–145)
Total Bilirubin: 0.8 mg/dL (ref 0.0–1.2)
Total Protein: 7.1 g/dL (ref 6.5–8.1)

## 2023-12-07 LAB — CBC WITH DIFFERENTIAL/PLATELET
Abs Immature Granulocytes: 5.98 K/uL — ABNORMAL HIGH (ref 0.00–0.07)
Basophils Absolute: 0.4 K/uL — ABNORMAL HIGH (ref 0.0–0.1)
Basophils Relative: 1 %
Eosinophils Absolute: 1 K/uL — ABNORMAL HIGH (ref 0.0–0.5)
Eosinophils Relative: 2 %
HCT: 29.5 % — ABNORMAL LOW (ref 39.0–52.0)
Hemoglobin: 8.9 g/dL — ABNORMAL LOW (ref 13.0–17.0)
Immature Granulocytes: 12 %
Lymphocytes Relative: 5 %
Lymphs Abs: 2.5 K/uL (ref 0.7–4.0)
MCH: 26 pg (ref 26.0–34.0)
MCHC: 30.2 g/dL (ref 30.0–36.0)
MCV: 86.3 fL (ref 80.0–100.0)
Monocytes Absolute: 1.6 K/uL — ABNORMAL HIGH (ref 0.1–1.0)
Monocytes Relative: 3 %
Neutro Abs: 37.7 K/uL — ABNORMAL HIGH (ref 1.7–7.7)
Neutrophils Relative %: 77 %
Platelets: 377 K/uL (ref 150–400)
RBC: 3.42 MIL/uL — ABNORMAL LOW (ref 4.22–5.81)
RDW: 33.8 % — ABNORMAL HIGH (ref 11.5–15.5)
Smear Review: NORMAL
WBC: 49.2 K/uL — ABNORMAL HIGH (ref 4.0–10.5)
nRBC: 0.2 % (ref 0.0–0.2)

## 2023-12-07 LAB — C DIFFICILE QUICK SCREEN W PCR REFLEX
C Diff antigen: POSITIVE — AB
C Diff toxin: NEGATIVE

## 2023-12-07 MED ORDER — DIPHENHYDRAMINE HCL 12.5 MG/5ML PO ELIX: 12.5000 mg | ORAL_SOLUTION | Freq: Four times a day (QID) | ORAL | Status: DC | PRN

## 2023-12-07 MED ORDER — MELATONIN 3 MG PO TABS
3.0000 mg | ORAL_TABLET | Freq: Every evening | ORAL | Status: DC | PRN
  Administered 2023-12-08: 3 mg via ORAL
  Filled 2023-12-07: qty 1

## 2023-12-07 MED ORDER — METHOCARBAMOL 500 MG PO TABS: 500.0000 mg | ORAL_TABLET | Freq: Three times a day (TID) | ORAL | Status: DC | PRN

## 2023-12-07 MED ORDER — METHOCARBAMOL 1000 MG/10ML IJ SOLN: 500.0000 mg | Freq: Three times a day (TID) | INTRAMUSCULAR | Status: DC | PRN

## 2023-12-07 MED ORDER — GABAPENTIN 100 MG PO CAPS
100.0000 mg | ORAL_CAPSULE | Freq: Two times a day (BID) | ORAL | Status: DC
  Administered 2023-12-08 – 2023-12-09 (×2): 100 mg via ORAL
  Filled 2023-12-07 (×3): qty 1

## 2023-12-07 MED ORDER — DIPHENHYDRAMINE HCL 50 MG/ML IJ SOLN: 12.5000 mg | Freq: Four times a day (QID) | INTRAMUSCULAR | Status: DC | PRN

## 2023-12-07 MED ORDER — ENOXAPARIN SODIUM 40 MG/0.4ML IJ SOSY
40.0000 mg | PREFILLED_SYRINGE | INTRAMUSCULAR | Status: DC
  Administered 2023-12-09: 40 mg via SUBCUTANEOUS
  Filled 2023-12-07: qty 0.4

## 2023-12-07 MED ORDER — TRAMADOL HCL 50 MG PO TABS: 50.0000 mg | ORAL_TABLET | Freq: Four times a day (QID) | ORAL | Status: DC | PRN

## 2023-12-07 MED ORDER — ONDANSETRON HCL 4 MG/2ML IJ SOLN: 4.0000 mg | Freq: Four times a day (QID) | INTRAMUSCULAR | Status: DC | PRN

## 2023-12-07 MED ORDER — GABAPENTIN 100 MG PO CAPS
200.0000 mg | ORAL_CAPSULE | Freq: Every day | ORAL | Status: DC
  Administered 2023-12-07 – 2023-12-08 (×2): 100 mg via ORAL
  Filled 2023-12-07 (×2): qty 2

## 2023-12-07 MED ORDER — LACTATED RINGERS IV SOLN: INTRAVENOUS | Status: AC

## 2023-12-07 MED ORDER — IOHEXOL 300 MG/ML  SOLN
80.0000 mL | Freq: Once | INTRAMUSCULAR | Status: AC | PRN
  Administered 2023-12-07: 80 mL via INTRAVENOUS

## 2023-12-07 MED ORDER — ONDANSETRON 4 MG PO TBDP: 4.0000 mg | ORAL_TABLET | Freq: Four times a day (QID) | ORAL | Status: DC | PRN

## 2023-12-07 MED ORDER — AMLODIPINE BESYLATE 5 MG PO TABS
5.0000 mg | ORAL_TABLET | Freq: Every day | ORAL | Status: DC
  Administered 2023-12-07 – 2023-12-08 (×2): 5 mg via ORAL
  Filled 2023-12-07 (×2): qty 1

## 2023-12-07 MED ORDER — SODIUM CHLORIDE 0.9 % IV SOLN
2.0000 g | INTRAVENOUS | Status: DC
  Administered 2023-12-07 – 2023-12-08 (×2): 2 g via INTRAVENOUS
  Filled 2023-12-07 (×2): qty 20

## 2023-12-07 MED ORDER — ASPIRIN 81 MG PO TBEC: 81.0000 mg | DELAYED_RELEASE_TABLET | ORAL | Status: DC

## 2023-12-07 MED ORDER — ACETAMINOPHEN 500 MG PO TABS
1000.0000 mg | ORAL_TABLET | Freq: Three times a day (TID) | ORAL | Status: DC
  Administered 2023-12-08 – 2023-12-09 (×4): 1000 mg via ORAL
  Filled 2023-12-07 (×5): qty 2

## 2023-12-07 MED ORDER — LACTATED RINGERS IV SOLN: INTRAVENOUS | Status: DC

## 2023-12-07 NOTE — ED Triage Notes (Signed)
 Pt presents to ED from home for post op complication. 5 days post op from cholecystectomy. Pt reports his WBC have been increasing (20 > 33 > 45), so had repeat CT abd today which was concerning for fluid collection in abdomen, surgeon asked him to come to ED.

## 2023-12-07 NOTE — Telephone Encounter (Signed)
 In ED per [facility] surgeon because of CT scan results.

## 2023-12-07 NOTE — ED Provider Notes (Signed)
 [place] EMERGENCY DEPARTMENT AT [facility] Provider Note   CSN: [id number] Arrival date & time: 12/07/23  1531     Patient presents with: Post-op Problem   Roy Johnson is a 78 y.o. male.   Patient is a 78 year old male with a history of thrombocythemia, chronically elevated white counts and low platelets, CKD stage III, recent admission and cholecystectomy on 12/02/2023 who is returning today due to worsening nausea, poor appetite and upper abdominal pain.  He gets his blood drawn regularly due to his issues with thrombocythemia and had his blood checked yesterday with a white count of 45 from 35 a few days ago but has had a stable hemoglobin and platelet count.  He saw his oncologist yesterday and they talked with his general surgeon who recommended he get a CAT scan.  He was in the office today and they told him to come to the emergency room because his CAT scan was abnormal there was concern for hematoma versus a bile leak.  Patient reports overall he has had no appetite but has not had any vomiting.  He has been having diarrhea but denies any urinary symptoms.  He has had some night sweats but denies any fever.  He takes no anticoagulation.  The history is provided by the patient.       Prior to Admission medications   Medication Sig Start Date End Date Taking? Authorizing Provider  acetaminophen  (TYLENOL ) 325 MG tablet Take 2 tablets (650 mg total) by mouth every 6 (six) hours as needed for mild pain (pain score 1-3), fever or headache. 12/04/23   Tobie Yetta HERO, MD  amLODipine  (NORVASC ) 5 MG tablet Take 5 mg by mouth at bedtime. 08/30/17   [provider]  anagrelide  (AGRYLIN) 1 MG capsule TAKE 1 CAPSULE(1 MG) BY MOUTH DAILY Patient taking differently: Take 1 mg by mouth 2 (two) times daily. 08/09/19   Sherrod Sherrod, MD  cetirizine (ZYRTEC) 10 MG tablet Take 10 mg by mouth daily as needed for allergies. 07/29/19   [provider]  cholestyramine   (QUESTRAN ) 4 g packet Take 1 packet (4 g total) by mouth 2 (two) times daily. 10/04/23   Abran Norleen SAILOR, MD  dextromethorphan-guaiFENesin  (MUCINEX  DM) 30-600 MG 12hr tablet Take 1 tablet by mouth 2 (two) times daily for 7 days. 12/04/23 12/11/23  Tobie Yetta HERO, MD  docusate sodium  (COLACE) 100 MG capsule Take 1 capsule (100 mg total) by mouth 2 (two) times daily as needed for mild constipation. 12/04/23   Tobie Yetta HERO, MD  epoetin  alfa-epbx (RETACRIT ) 40000 UNIT/ML injection Inject 1 mL into the skin once a week.    [provider]  ferrous sulfate  325 (65 FE) MG EC tablet Take 1 tablet (325 mg total) by mouth daily with breakfast. 12/04/23 12/03/24  Tobie Yetta HERO, MD  fluticasone  (FLONASE ) 50 MCG/ACT nasal spray Place 1 spray into both nostrils daily for 7 days. 12/04/23 12/11/23  Tobie Yetta HERO, MD  gabapentin  (NEURONTIN ) 100 MG capsule TAKE 1 CAPSULE BY MOUTH TWICE DAILY AND 2 CAPSULES BY MOUTH EVERY EVENING Patient taking differently: Take 100-200 mg by mouth See admin instructions. Take 1 capsule (100mg ) by mouth in the morning, take 1 capsule in the afternoon and take 2 capsules (200mg ) at night. 10/31/23   Onita Duos, MD  hyoscyamine  (LEVSIN  SL) 0.125 MG SL tablet Take 1 tablet (0.125 mg total) by mouth every 6 (six) hours as needed. 10/04/23   Abran Norleen SAILOR, MD  ipratropium (  ATROVENT) 0.06 % nasal spray Place 1 spray into both nostrils daily as needed for rhinitis.    [provider]  luspatercept -aamt (REBLOZYL ) 25 MG subcutaneous injection Inject 25 mg into the skin See admin instructions. Inject 25mg  into the skin every 3 weeks.    [provider]  methocarbamol  (ROBAXIN ) 500 MG tablet Take 1 tablet (500 mg total) by mouth every 8 (eight) hours as needed for muscle spasms. 12/04/23   Tobie Yetta HERO, MD  Multiple Vitamins-Minerals (MULTIVITAMIN MEN 50+) TABS Take 1 tablet by mouth daily.    [provider]  ondansetron  (ZOFRAN ) 4 MG tablet Take 1 tablet (4 mg  total) by mouth every 8 (eight) hours as needed for nausea or vomiting. 12/04/23   Tobie Yetta HERO, MD  oxyCODONE  (OXY IR/ROXICODONE ) 5 MG immediate release tablet Take 1 tablet (5 mg total) by mouth every 6 (six) hours as needed for breakthrough pain. 12/04/23   Maczis, Michael M, PA-C    Allergies: Patient has no known allergies.    Review of Systems  Updated Vital Signs BP (!) 157/79 (BP Location: Left Arm)   Pulse 90   Temp 98.7 F (37.1 C) (Oral)   Resp 18   Ht 6' 2 (1.88 m)   Wt 95.3 kg   SpO2 95%   BMI 26.96 kg/m   Physical Exam Vitals and nursing note reviewed.  Constitutional:      General: He is not in acute distress.    Appearance: He is well-developed.  HENT:     Head: Normocephalic and atraumatic.  Eyes:     Conjunctiva/sclera: Conjunctivae normal.     Pupils: Pupils are equal, round, and reactive to light.  Cardiovascular:     Rate and Rhythm: Normal rate and regular rhythm.     Heart sounds: No murmur heard. Pulmonary:     Effort: Pulmonary effort is normal. No respiratory distress.     Breath sounds: Normal breath sounds. No wheezing or rales.  Abdominal:     General: There is no distension.     Palpations: Abdomen is soft.     Tenderness: There is abdominal tenderness. There is no guarding or rebound.     Comments: Patient has significant ecchymosis over the right upper quadrant and slightly into the flank.  Surgical sites are intact without any drainage or significant erythema.  Tenderness noted in the right side of the abdomen.  No rebound.  Musculoskeletal:        General: No tenderness. Normal range of motion.     Cervical back: Normal range of motion and neck supple.     Right lower leg: No edema.     Left lower leg: No edema.  Skin:    General: Skin is warm and dry.     Findings: No erythema or rash.  Neurological:     Mental Status: He is alert and oriented to person, place, and time. Mental status is at baseline.  Psychiatric:         Behavior: Behavior normal.     (all labs ordered are listed, but only abnormal results are displayed) Labs Reviewed  CBC WITH DIFFERENTIAL/PLATELET  COMPREHENSIVE METABOLIC PANEL WITH GFR    EKG: None  Radiology: CT ABDOMEN PELVIS W CONTRAST Result Date: 12/07/2023 CLINICAL DATA:  Leukocytosis and possible low-grade fever. Status post cholecystectomy on 12/02/2023. EXAM: CT ABDOMEN AND PELVIS WITH CONTRAST TECHNIQUE: Multidetector CT imaging of the abdomen and pelvis was performed using the standard protocol following bolus administration  of intravenous contrast. RADIATION DOSE REDUCTION: This exam was performed according to the departmental dose-optimization program which includes automated exposure control, adjustment of the mA and/or kV according to patient size and/or use of iterative reconstruction technique. CONTRAST:  80mL OMNIPAQUE  IOHEXOL  300 MG/ML  SOLN COMPARISON:  09/10/2021 FINDINGS: Lower chest: Stable mildly enlarged heart. Interval mild linear atelectasis at the right lung base. Hepatobiliary: No significant change in multiple small liver cysts. Interval surgical absence of the gallbladder. Multifocal medium density material in the gallbladder fossa and extending inferior to the gallbladder fossa with a minimal amount of associated fluid in the gallbladder fossa and moderate amount of associated fluid inferior to the gallbladder fossa. This is confined inferior to the gallbladder fossa without extension into the remainder of the peritoneal cavity. Mild adjacent soft tissue stranding. The portion inferior to the gallbladder fossa measures 3.1 x 2.6 cm on image number 14/304 and 1.9 cm in length on coronal image number 93/601. The combined area of this abnormality in the gallbladder fossa and inferior to the gallbladder fossa measures 6.7 cm in length on coronal image number 93/133 and 6.9 x 3.7 cm on axial image number 28/301. Pancreas: Unremarkable. No pancreatic ductal dilatation or  surrounding inflammatory changes. Spleen: Stable splenomegaly. Adrenals/Urinary Tract: Adrenal glands are unremarkable. Kidneys are normal, without renal calculi, focal lesion, or hydronephrosis. Bladder is unremarkable. Stomach/Bowel: Large number of sigmoid colon diverticula without evidence of diverticulitis. Two right colon diverticulum without evidence of diverticulitis. 4 mm proximal appendix appendicoliths with the remainder of the appendix having a normal appearance without evidence of appendicitis. Unremarkable stomach and small bowel. Vascular/Lymphatic: No significant vascular findings are present. No enlarged abdominal or pelvic lymph nodes. Reproductive: Surgically absent prostate gland. Other: Small amount of free peritoneal fluid in the inferior pelvis measuring 20 Hounsfield units in density on image number 70/301. Small amount of similar density fluid adjacent to the liver anteriorly and laterally. There is also an interval medium density fluid collection anterior to the lateral segment of the left lobe of the liver measuring 3.9 x 1.6 cm on image number 19/301 and 4.1 cm in length on sagittal image number 79/602. This has a density similar to the medium density areas in the gallbladder fossa. Midline surgical scar. Musculoskeletal: Mild lumbar and lower thoracic spine degenerative changes. IMPRESSION: 1. Interval probable blood clots in the gallbladder fossa and extending inferior to the gallbladder fossa with intermingled fluid spanning an area measuring 6.9 x 6.7 x 3.7 cm. The intermingled fluid could represent developing seroma fluid or fluid associated with a bile leak. A developing abscess is less likely based on the current appearance. 2. Small amount of free peritoneal fluid in the inferior pelvis and adjacent to the liver. This could represent residual postoperative fluid or fluid associated with a bile leak. 3. Interval 3.9 x 1.6 x 4.1 cm hematoma anterior to the lateral segment of the  left lobe of the liver. 4. Stable splenomegaly. 5. Sigmoid and right colon diverticulosis without evidence of diverticulitis. 6. 4 mm proximal appendicolith without evidence of appendicitis. Electronically Signed   By: Elspeth Bathe M.D.   On: 12/07/2023 13:31     Procedures   Medications Ordered in the ED  lactated ringers  infusion (has no administration in time range)                                    Medical Decision Making Amount  and/or Complexity of Data Reviewed Labs: ordered.  Risk Prescription drug management. Decision regarding hospitalization.   Pt with multiple medical problems and comorbidities and presenting today with a complaint that caries a high risk for morbidity and mortality.  Here today due to the above concerns.  Concern for abdominal hematoma, bile leak versus possible infection.  However patient is not having significant reports of fever and lower suspicion for abscess.  He takes no anticoagulation.  Does have abdominal tenderness but not peritonitic.  Spoke with Dr. Dasie with general surgery who will come and see the patient and admit him.  He has already had CT done prior to arrival.  Labs from yesterday were reviewed and showed stable hemoglobin, platelets and CMP 2 days ago with normal LFTs and bilirubin.  These were repeated today.  Patient is not desiring any pain or nausea medication at this time.      Final diagnoses:  Postoperative pain  Abdominal hematoma    ED Discharge Orders     None          Doretha Folks, MD 12/07/23 1739

## 2023-12-07 NOTE — H&P (Addendum)
 Roy Johnson March 24, 1946  [id number].     HPI:  [name] is a 78 yo male who underwent a laparoscopic cholecystectomy on 8/23 for cholecystitis. His bilirubin and transaminases were elevated at admission, but downtrended prior to surgery and were normal at the time of discharge. He was discharged home on POD2. He has been having continued RUQ abdominal discomfort. He has also been having frequent watery diarrhea, about 5 times per day, since surgery. He has a history of chronic intermittent diarrhea, but says this is much worse than usual. He had night sweats last night, but no fevers. He has been eating, although reports his appetite is diminished. He has myelodysplastic syndrome and gets weekly labs by his oncologist, and his WBC has been going up. Yesterday it was 45. He called the office today and was sent for a CT scan, which showed some fluid in the gallbladder fossa, suspicious for a hematoma vs a developing biloma. He was sent to the ED.  WBC today is 49. LFTs are pending.   ROS: Review of Systems  Constitutional:  Positive for chills. Negative for fever.  Respiratory:  Negative for shortness of breath.   Cardiovascular:  Negative for chest pain.  Gastrointestinal:  Positive for abdominal pain. Negative for nausea and vomiting.    Family History  Problem Relation Age of Onset   Osteoarthritis Mother    Breast cancer Mother    Heart disease Father    Colon polyps Father    Lung cancer Father        cancerous mole   Prostate cancer Father    Colon cancer Neg Hx    Esophageal cancer Neg Hx    Rectal cancer Neg Hx    Stomach cancer Neg Hx    Pancreatic cancer Neg Hx     Past Medical History:  Diagnosis Date   Anemia    Anesthesia of skin    Calculus of gallbladder with acute cholecystitis, with obstruction    Cellulitis of back    Chest pain    Chronic kidney disease (CKD), stage III (moderate) (HCC)    Colon polyp    Cough    Disorientation    Diverticulitis     Diverticulosis    Dyspnea on exertion    Elevated white blood cell count    Fatigue    Forgetfulness    GERD (gastroesophageal reflux disease)    Hearing loss    HTN (hypertension)    Hydrocele    Hyperkalemia    Hyperlipidemia    Hypertensive chronic kidney disease with stage 1 through stage 4 chronic kidney disease, or unspecified chronic kidney disease    Hypokalemia    IBS (irritable bowel syndrome)    Impacted cerumen, bilateral    Inguinal hernia    Insomnia    Lightheadedness    Liver disease    Lower abdominal pain    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   Mixed stress and urge urinary incontinence    Myelodysplastic syndrome (HCC)    Nausea with vomiting    Numbness and tingling in both hands    Other amnesia    Other benign neoplasm of skin, unspecified    Overflow incontinence of urine    Pain in right leg    Paresthesia of skin    Prostate cancer (HCC)    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 His smoking use included cigarettes. He has never used smokeless tobacco. He reports current alcohol use. He reports that he does not use drugs.  Allergies: No Known Allergies  (Not in a hospital admission)    Physical Exam: Blood pressure (!) 157/79, pulse 90, temperature 98.7 F (37.1 C), temperature source Oral, resp. rate 18, height 6' 2 (1.88 m), weight 95.3 kg, SpO2 95%. General: resting comfortably, appears stated age, no apparent  distress Neurological: alert and oriented, no focal deficits HEENT: normocephalic, atraumatic CV: regular rate and rhythm Respiratory: normal work of breathing on room air Abdomen: soft, nondistended, nontender. RUQ incisions all have surrounding ecchymosis, but no erythema, induration or fluctuance. Umbilical incision had a gauze dressing in place, which on removal has a small amount of thick, dark brown fluid, with mild skin separation of the incision, and mild surrounding erythema and induration.  Extremities: warm and well-perfused, no deformities, moving all extremities spontaneously Psychiatric: normal mood and affect Skin: warm and dry, no jaundice   Results for orders placed or performed during the hospital encounter of 12/07/23 (from the past 48 hours)  CBC with Differential     Status: Abnormal   Collection Time: 12/07/23  5:15 PM  Result Value Ref Range   WBC 49.2 (H) 4.0 - 10.5 K/uL   RBC 3.42 (L) 4.22 - 5.81 MIL/uL   Hemoglobin 8.9 (L) 13.0 - 17.0 g/dL   HCT 70.4 (L) 60.9 - 47.9 %   MCV 86.3 80.0 - 100.0 fL   MCH 26.0 26.0 - 34.0 pg   MCHC 30.2 30.0 - 36.0 g/dL   RDW 66.1 (H) 88.4 - 84.4 %   Platelets 377 150 - 400 K/uL   nRBC 0.2 0.0 - 0.2 %   Neutrophils Relative % 77 %   Neutro Abs 37.7 (H) 1.7 - 7.7 K/uL   Lymphocytes Relative 5 %   Lymphs Abs 2.5 0.7 - 4.0 K/uL   Monocytes Relative 3 %   Monocytes Absolute 1.6 (H) 0.1 - 1.0 K/uL   Eosinophils Relative 2 %   Eosinophils Absolute 1.0 (H) 0.0 - 0.5 K/uL   Basophils Relative 1 %   Basophils Absolute 0.4 (H) 0.0 - 0.1 K/uL   WBC Morphology See Note     Comment: Mild Left Shift (1-5% metas, occ myelo) DOHLE BODIES    RBC Morphology See Note    Smear Review Normal platelet morphology    Immature Granulocytes 12 %   Abs Immature Granulocytes 5.98 (H) 0.00 - 0.07 K/uL   Polychromasia PRESENT    Target Cells PRESENT     Comment: Performed at [facility], [address], [place], [place]  [address]   *Note: Due to a large number of results and/or encounters for the requested time period, some results have not been displayed. A complete set of results can be found in Results Review.   CT ABDOMEN PELVIS W CONTRAST Result Date: 12/07/2023 CLINICAL DATA:  Leukocytosis and possible low-grade fever.  Status post cholecystectomy on 12/02/2023. EXAM: CT ABDOMEN AND PELVIS WITH CONTRAST TECHNIQUE: Multidetector CT imaging of the abdomen and pelvis was performed using the standard protocol following bolus administration of intravenous contrast. RADIATION DOSE REDUCTION: This exam was performed according to the departmental dose-optimization program which includes automated exposure control, adjustment of the mA and/or kV according to patient size and/or use of iterative reconstruction technique. CONTRAST:  80mL OMNIPAQUE  IOHEXOL  300 MG/ML  SOLN COMPARISON:  09/10/2021 FINDINGS: Lower chest: Stable mildly enlarged heart. Interval mild linear atelectasis at the right lung base. Hepatobiliary: No significant change in multiple small liver cysts. Interval surgical absence of the gallbladder. Multifocal medium density material in the gallbladder fossa and extending inferior to the gallbladder fossa with a minimal amount of associated fluid in the gallbladder fossa and moderate amount of associated fluid inferior to the gallbladder fossa. This is confined inferior to the gallbladder fossa without extension into the remainder of the peritoneal cavity. Mild adjacent soft tissue stranding. The portion inferior to the gallbladder fossa measures 3.1 x 2.6 cm on image number 14/304 and 1.9 cm in length on coronal image number 93/601. The combined area of this abnormality in the gallbladder fossa and inferior to the gallbladder fossa measures 6.7 cm in length on coronal image number 93/133 and 6.9 x 3.7 cm on axial image number 28/301. Pancreas: Unremarkable. No pancreatic ductal dilatation or surrounding inflammatory  changes. Spleen: Stable splenomegaly. Adrenals/Urinary Tract: Adrenal glands are unremarkable. Kidneys are normal, without renal calculi, focal lesion, or hydronephrosis. Bladder is unremarkable. Stomach/Bowel: Large number of sigmoid colon diverticula without evidence of diverticulitis. Two right colon diverticulum without evidence of diverticulitis. 4 mm proximal appendix appendicoliths with the remainder of the appendix having a normal appearance without evidence of appendicitis. Unremarkable stomach and small bowel. Vascular/Lymphatic: No significant vascular findings are present. No enlarged abdominal or pelvic lymph nodes. Reproductive: Surgically absent prostate gland. Other: Small amount of free peritoneal fluid in the inferior pelvis measuring 20 Hounsfield units in density on image number 70/301. Small amount of similar density fluid adjacent to the liver anteriorly and laterally. There is also an interval medium density fluid collection anterior to the lateral segment of the left lobe of the liver measuring 3.9 x 1.6 cm on image number 19/301 and 4.1 cm in length on sagittal image number 79/602. This has a density similar to the medium density areas in the gallbladder fossa. Midline surgical scar. Musculoskeletal: Mild lumbar and lower thoracic spine degenerative changes. IMPRESSION: 1. Interval probable blood clots in the gallbladder fossa and extending inferior to the gallbladder fossa with intermingled fluid spanning an area measuring 6.9 x 6.7 x 3.7 cm. The intermingled fluid could represent developing seroma fluid or fluid associated with a bile leak. A developing abscess is less likely based on the current appearance. 2. Small amount of free peritoneal fluid in the inferior pelvis and adjacent to the liver. This could represent residual postoperative fluid or fluid associated with a bile leak. 3. Interval 3.9 x 1.6 x 4.1 cm hematoma anterior to the lateral segment of the left lobe of the liver. 4.  Stable splenomegaly. 5. Sigmoid and right colon diverticulosis without evidence of diverticulitis. 6. 4 mm proximal appendicolith without evidence of appendicitis. Electronically Signed   By: Elspeth Bathe M.D.   On: 12/07/2023 13:31      Assessment/Plan 78 yo male 5 days s/p laparoscopic cholecystectomy for acute cholecystitis, now presenting with persistent abdominal discomfort, diarrhea, and worsening leukocytosis. CT scan shows some  fluid and stranding in the gallbladder fossa, which could represent postop changes, however there is concern for a bile leak given his leukocytosis.  - HIDA pending to evaluate for cystic duct stump leak - Cellulitis of umbilical incision: exam consistent with a small hematoma that likely became infected. Will treat with antibiotics, although this is likely not the source of significant leukocytosis. - Check C diff given significant leukocytosis and diarrhea. Patient has a prior history of C diff. - FEN: Regular diet, NPO midnight in case of need for drain placement tomorrow - VTE: lovenox , SCDs - Code status: Patient previously had a DNR code status. Discussed with him and he states he would want CPR, shocks and intubation in the event of a cardiac arrest, however would not want prolonged measures if not expected to make a meaningful recovery. After our discussion, he is in agreement to be full code.  - Dispo: admit to observation, med-surg floor   Leonor Dawn, MD [facility] General, Hepatobiliary and Pancreatic Surgery 12/07/23 7:21 PM

## 2023-12-08 ENCOUNTER — Observation Stay (HOSPITAL_COMMUNITY)

## 2023-12-08 DIAGNOSIS — Z808 Family history of malignant neoplasm of other organs or systems: Secondary | ICD-10-CM | POA: Diagnosis not present

## 2023-12-08 DIAGNOSIS — H9190 Unspecified hearing loss, unspecified ear: Secondary | ICD-10-CM | POA: Diagnosis present

## 2023-12-08 DIAGNOSIS — Z87891 Personal history of nicotine dependence: Secondary | ICD-10-CM | POA: Diagnosis not present

## 2023-12-08 DIAGNOSIS — G8918 Other acute postprocedural pain: Secondary | ICD-10-CM | POA: Diagnosis present

## 2023-12-08 DIAGNOSIS — N183 Chronic kidney disease, stage 3 unspecified: Secondary | ICD-10-CM | POA: Diagnosis present

## 2023-12-08 DIAGNOSIS — Z79899 Other long term (current) drug therapy: Secondary | ICD-10-CM | POA: Diagnosis not present

## 2023-12-08 DIAGNOSIS — Z8546 Personal history of malignant neoplasm of prostate: Secondary | ICD-10-CM | POA: Diagnosis not present

## 2023-12-08 DIAGNOSIS — D473 Essential (hemorrhagic) thrombocythemia: Secondary | ICD-10-CM | POA: Diagnosis present

## 2023-12-08 DIAGNOSIS — Z860100 Personal history of colon polyps, unspecified: Secondary | ICD-10-CM | POA: Diagnosis not present

## 2023-12-08 DIAGNOSIS — I129 Hypertensive chronic kidney disease with stage 1 through stage 4 chronic kidney disease, or unspecified chronic kidney disease: Secondary | ICD-10-CM | POA: Diagnosis present

## 2023-12-08 DIAGNOSIS — L03316 Cellulitis of umbilicus: Secondary | ICD-10-CM | POA: Diagnosis present

## 2023-12-08 DIAGNOSIS — Z8042 Family history of malignant neoplasm of prostate: Secondary | ICD-10-CM | POA: Diagnosis not present

## 2023-12-08 DIAGNOSIS — K529 Noninfective gastroenteritis and colitis, unspecified: Secondary | ICD-10-CM | POA: Diagnosis present

## 2023-12-08 DIAGNOSIS — D461 Refractory anemia with ring sideroblasts: Secondary | ICD-10-CM | POA: Diagnosis present

## 2023-12-08 DIAGNOSIS — E785 Hyperlipidemia, unspecified: Secondary | ICD-10-CM | POA: Diagnosis present

## 2023-12-08 DIAGNOSIS — L7632 Postprocedural hematoma of skin and subcutaneous tissue following other procedure: Secondary | ICD-10-CM | POA: Diagnosis present

## 2023-12-08 DIAGNOSIS — Z83719 Family history of colon polyps, unspecified: Secondary | ICD-10-CM | POA: Diagnosis not present

## 2023-12-08 DIAGNOSIS — Z801 Family history of malignant neoplasm of trachea, bronchus and lung: Secondary | ICD-10-CM | POA: Diagnosis not present

## 2023-12-08 DIAGNOSIS — Z9049 Acquired absence of other specified parts of digestive tract: Secondary | ICD-10-CM | POA: Diagnosis not present

## 2023-12-08 DIAGNOSIS — Z8249 Family history of ischemic heart disease and other diseases of the circulatory system: Secondary | ICD-10-CM | POA: Diagnosis not present

## 2023-12-08 DIAGNOSIS — D471 Chronic myeloproliferative disease: Secondary | ICD-10-CM | POA: Diagnosis present

## 2023-12-08 DIAGNOSIS — R61 Generalized hyperhidrosis: Secondary | ICD-10-CM | POA: Diagnosis present

## 2023-12-08 DIAGNOSIS — Y838 Other surgical procedures as the cause of abnormal reaction of the patient, or of later complication, without mention of misadventure at the time of the procedure: Secondary | ICD-10-CM | POA: Diagnosis present

## 2023-12-08 DIAGNOSIS — Z803 Family history of malignant neoplasm of breast: Secondary | ICD-10-CM | POA: Diagnosis not present

## 2023-12-08 DIAGNOSIS — R110 Nausea: Secondary | ICD-10-CM | POA: Diagnosis present

## 2023-12-08 LAB — CBC
HCT: 28.6 % — ABNORMAL LOW (ref 39.0–52.0)
Hemoglobin: 8.7 g/dL — ABNORMAL LOW (ref 13.0–17.0)
MCH: 26.3 pg (ref 26.0–34.0)
MCHC: 30.4 g/dL (ref 30.0–36.0)
MCV: 86.4 fL (ref 80.0–100.0)
Platelets: 341 K/uL (ref 150–400)
RBC: 3.31 MIL/uL — ABNORMAL LOW (ref 4.22–5.81)
RDW: 33.8 % — ABNORMAL HIGH (ref 11.5–15.5)
WBC: 43.9 K/uL — ABNORMAL HIGH (ref 4.0–10.5)
nRBC: 0.3 % — ABNORMAL HIGH (ref 0.0–0.2)

## 2023-12-08 LAB — COMPREHENSIVE METABOLIC PANEL WITH GFR
ALT: 48 U/L — ABNORMAL HIGH (ref 0–44)
AST: 20 U/L (ref 15–41)
Albumin: 4 g/dL (ref 3.5–5.0)
Alkaline Phosphatase: 120 U/L (ref 38–126)
Anion gap: 11 (ref 5–15)
BUN: 13 mg/dL (ref 8–23)
CO2: 25 mmol/L (ref 22–32)
Calcium: 9.3 mg/dL (ref 8.9–10.3)
Chloride: 106 mmol/L (ref 98–111)
Creatinine, Ser: 1.18 mg/dL (ref 0.61–1.24)
GFR, Estimated: >60 mL/min (ref >60)
Glucose, Bld: 87 mg/dL (ref 70–99)
Potassium: 4 mmol/L (ref 3.5–5.1)
Sodium: 143 mmol/L (ref 135–145)
Total Bilirubin: 0.6 mg/dL (ref 0.0–1.2)
Total Protein: 6.6 g/dL (ref 6.5–8.1)

## 2023-12-08 LAB — CLOSTRIDIUM DIFFICILE BY PCR, REFLEXED
Hypervirulent Strain: NEGATIVE
Toxigenic C. Difficile by PCR: NEGATIVE

## 2023-12-08 MED ORDER — CHOLESTYRAMINE LIGHT 4 G PO PACK
4.0000 g | PACK | Freq: Every day | ORAL | Status: DC
  Administered 2023-12-08: 4 g via ORAL
  Filled 2023-12-08: qty 1

## 2023-12-08 MED ORDER — TECHNETIUM TC 99M MEBROFENIN IV KIT
5.0000 | PACK | Freq: Once | INTRAVENOUS | Status: AC | PRN
  Administered 2023-12-08: 5.5 via INTRAVENOUS

## 2023-12-08 NOTE — Progress Notes (Signed)
 Subjective/Chief Complaint: Complains of soreness with some nausea   Objective: Vital signs in last 24 hours: Temp:  [98 F (36.7 C)-98.7 F (37.1 C)] 98.1 F (36.7 C) (08/29 0459) Pulse Rate:  [73-90] 73 (08/29 0459) Resp:  [18-20] 18 (08/29 0459) BP: (132-157)/(70-79) 132/70 (08/29 0459) SpO2:  [95 %] 95 % (08/29 0459) Weight:  [95.3 kg] 95.3 kg (08/28 1535) Last BM Date : 12/07/23  Intake/Output from previous day: 08/28 0701 - 08/29 0700 In: 380.8 [I.V.:280.8; IV Piggyback:100] Out: -  Intake/Output this shift: No intake/output data recorded.  General appearance: alert and cooperative Resp: clear to auscultation bilaterally Cardio: regular rate and rhythm GI: soft, focal tenderness right abd with moderate bruising along flank and incisions  Lab Results:  Recent Labs    12/07/23 1715 12/08/23 0454  WBC 49.2* 43.9*  HGB 8.9* 8.7*  HCT 29.5* 28.6*  PLT 377 341   BMET Recent Labs    12/07/23 1715 12/08/23 0454  NA 138 143  K 4.3 4.0  CL 101 106  CO2 22 25  GLUCOSE 88 87  BUN 14 13  CREATININE 1.14 1.18  CALCIUM  9.4 9.3   PT/INR No results for input(s): LABPROT, INR in the last 72 hours. ABG No results for input(s): PHART, HCO3 in the last 72 hours.  Invalid input(s): PCO2, PO2  Studies/Results: CT ABDOMEN PELVIS W CONTRAST Result Date: 12/07/2023 CLINICAL DATA:  Leukocytosis and possible low-grade fever. Status post cholecystectomy on 12/02/2023. EXAM: CT ABDOMEN AND PELVIS WITH CONTRAST TECHNIQUE: Multidetector CT imaging of the abdomen and pelvis was performed using the standard protocol following bolus administration of intravenous contrast. RADIATION DOSE REDUCTION: This exam was performed according to the departmental dose-optimization program which includes automated exposure control, adjustment of the mA and/or kV according to patient size and/or use of iterative reconstruction technique. CONTRAST:  80mL OMNIPAQUE  IOHEXOL  300 MG/ML   SOLN COMPARISON:  09/10/2021 FINDINGS: Lower chest: Stable mildly enlarged heart. Interval mild linear atelectasis at the right lung base. Hepatobiliary: No significant change in multiple small liver cysts. Interval surgical absence of the gallbladder. Multifocal medium density material in the gallbladder fossa and extending inferior to the gallbladder fossa with a minimal amount of associated fluid in the gallbladder fossa and moderate amount of associated fluid inferior to the gallbladder fossa. This is confined inferior to the gallbladder fossa without extension into the remainder of the peritoneal cavity. Mild adjacent soft tissue stranding. The portion inferior to the gallbladder fossa measures 3.1 x 2.6 cm on image number 14/304 and 1.9 cm in length on coronal image number 93/601. The combined area of this abnormality in the gallbladder fossa and inferior to the gallbladder fossa measures 6.7 cm in length on coronal image number 93/133 and 6.9 x 3.7 cm on axial image number 28/301. Pancreas: Unremarkable. No pancreatic ductal dilatation or surrounding inflammatory changes. Spleen: Stable splenomegaly. Adrenals/Urinary Tract: Adrenal glands are unremarkable. Kidneys are normal, without renal calculi, focal lesion, or hydronephrosis. Bladder is unremarkable. Stomach/Bowel: Large number of sigmoid colon diverticula without evidence of diverticulitis. Two right colon diverticulum without evidence of diverticulitis. 4 mm proximal appendix appendicoliths with the remainder of the appendix having a normal appearance without evidence of appendicitis. Unremarkable stomach and small bowel. Vascular/Lymphatic: No significant vascular findings are present. No enlarged abdominal or pelvic lymph nodes. Reproductive: Surgically absent prostate gland. Other: Small amount of free peritoneal fluid in the inferior pelvis measuring 20 Hounsfield units in density on image number 70/301. Small amount of similar density  fluid  adjacent to the liver anteriorly and laterally. There is also an interval medium density fluid collection anterior to the lateral segment of the left lobe of the liver measuring 3.9 x 1.6 cm on image number 19/301 and 4.1 cm in length on sagittal image number 79/602. This has a density similar to the medium density areas in the gallbladder fossa. Midline surgical scar. Musculoskeletal: Mild lumbar and lower thoracic spine degenerative changes. IMPRESSION: 1. Interval probable blood clots in the gallbladder fossa and extending inferior to the gallbladder fossa with intermingled fluid spanning an area measuring 6.9 x 6.7 x 3.7 cm. The intermingled fluid could represent developing seroma fluid or fluid associated with a bile leak. A developing abscess is less likely based on the current appearance. 2. Small amount of free peritoneal fluid in the inferior pelvis and adjacent to the liver. This could represent residual postoperative fluid or fluid associated with a bile leak. 3. Interval 3.9 x 1.6 x 4.1 cm hematoma anterior to the lateral segment of the left lobe of the liver. 4. Stable splenomegaly. 5. Sigmoid and right colon diverticulosis without evidence of diverticulitis. 6. 4 mm proximal appendicolith without evidence of appendicitis. Electronically Signed   By: Elspeth Bathe M.D.   On: 12/07/2023 13:31    Anti-infectives: Anti-infectives (From admission, onward)    Start     Dose/Rate Route Frequency Ordered Stop   12/07/23 2200  cefTRIAXone  (ROCEPHIN ) 2 g in sodium chloride  0.9 % 100 mL IVPB        2 g 200 mL/hr over 30 Minutes Intravenous Every 24 hours 12/07/23 2123         Assessment/Plan: s/p * No surgery found * Monitor hg. I believe the blood loss was in the soft tissue of flank but is stable Elevated wbc. Continue IV abx. C diff neg Plan for HIDA today to rule out bile leak Myeloproliferative disorder. Can account for some of his wbc  LOS: 0 days    Deward Null III 12/08/2023

## 2023-12-08 NOTE — Plan of Care (Signed)

## 2023-12-08 NOTE — Progress Notes (Signed)
 Periumbilical wound opened a bit.  The small suture was cut.  No purulent drainage was encountered but the surrounding area is erythematous.  We will let him shower to get water in the wound and clean this up.  Dry dressing over wound otherwise.  Too small to pack.  HIDA scan with no bile leak.  No other obvious sources of infection.  Patient has baseline diarrhea from the medications he is on.  I have resumed his home [place]  to help with this.  I think this has just been exacerbated some after having his gallbladder out.  We will check his CBC in the am, but likely plan for DC home tomorrow.  Burnard FORBES Banter 4:25 PM 12/08/2023

## 2023-12-09 LAB — CBC
HCT: 28.8 % — ABNORMAL LOW (ref 39.0–52.0)
Hemoglobin: 8.8 g/dL — ABNORMAL LOW (ref 13.0–17.0)
MCH: 26.7 pg (ref 26.0–34.0)
MCHC: 30.6 g/dL (ref 30.0–36.0)
MCV: 87.5 fL (ref 80.0–100.0)
Platelets: 323 K/uL (ref 150–400)
RBC: 3.29 MIL/uL — ABNORMAL LOW (ref 4.22–5.81)
RDW: 33.6 % — ABNORMAL HIGH (ref 11.5–15.5)
WBC: 43.8 K/uL — ABNORMAL HIGH (ref 4.0–10.5)
nRBC: 0.3 % — ABNORMAL HIGH (ref 0.0–0.2)

## 2023-12-09 MED ORDER — AMOXICILLIN-POT CLAVULANATE 875-125 MG PO TABS: 1.0000 | ORAL_TABLET | Freq: Two times a day (BID) | ORAL | 0 refills | Status: AC

## 2023-12-09 NOTE — Downtime Event Note (Signed)
 The EMR was working properly during time, however, the printers for the unit were down for 24 hours or greater on 12/09/2023.  Roy Johnson was responsible for completing the discharge with the patient. Education material discussed with patient prior to do discharge.   Unable to print AVS. Ticket created 12/08/23. Ticket elevated to emergent situation on 12/09/23.    The following information was entered into the system by [name]: Discharge information  The following information will remain in the patient's chart: AVS   [name] 12/09/2023

## 2023-12-09 NOTE — Plan of Care (Signed)

## 2023-12-09 NOTE — Discharge Instructions (Signed)
 Follow-up with your blood draws on Tuesday and your oncologist  Call for any issues regarding abdominal pain, nausea, or vomiting.  Our office number is [phone]  Refrain from heavy lifting for 1 more week

## 2023-12-09 NOTE — Progress Notes (Signed)
 Patient ID: Roy Johnson, male   DOB: 30-Jan-1946, 78 y.o.   MRN: [id number]   He is feeling better today.  He is still having loose bowel movements.  His pain is controlled and he is tolerating a regular diet.  His white blood count and hemoglobin remained stable.  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

## 2023-12-09 NOTE — Progress Notes (Signed)
   12/09/23 1251  TOC Brief Assessment  Insurance and Status Reviewed  Patient has primary care physician Yes  Home environment has been reviewed Home w/ spouse  Prior level of function: independent  Prior/Current Home Services No current home services  Social Drivers of Health Review SDOH reviewed no interventions necessary  Readmission risk has been reviewed Yes  Transition of care needs no transition of care needs at this time

## 2023-12-09 NOTE — Discharge Summary (Signed)
 Physician Discharge Summary  Patient ID: Roy Johnson MRN: [id number] DOB/AGE: September 22, 1945 78 y.o.  Admit date: 12/07/2023 Discharge date: 12/09/2023  Admission Diagnoses:  Discharge Diagnoses:  Principal Problem:   S/P laparoscopic cholecystectomy   Discharged Condition: good  Hospital Course: This is a patient with a history of myelodysplastic syndrome who is status post laparoscopic cholecystectomy with a week ago.  He was discharged home on postoperative day 2 but returned with night sweats and diarrhea which is chronic for him.  His white blood count which is normally elevated in the 30,000 range was 46K.  He underwent a CT scan showing a flank hematoma as well as possible hematoma versus biloma in the gallbladder fossa.  He received medication from his oncologist to increase his hemoglobin.  After admission, he underwent a HIDA scan which showed no evidence of bile leak.  Today he reports minimal discomfort.  His laboratory data remained stable.  His hemoglobin has not yet responded to the injection he had by his oncologist last week.  He has no dizziness and is ambulating well and tolerating a diet.  Given the above, the decision was made to discharge the patient to home  Consults: None  Significant Diagnostic Studies: HIDA scan and CT scan  Treatments: Antibiotics and IV rehydration  Discharge Exam: Blood pressure (!) 145/75, pulse 71, temperature (!) 97.2 F (36.2 C), resp. rate 17, height 6' 2 (1.88 m), weight 95.3 kg, SpO2 94%. General appearance: alert, cooperative, and no distress Resp: clear to auscultation bilaterally Cardio: regular rate and rhythm, S1, S2 normal, no murmur, click, rub or gallop Incision/Wound: Abdomen is soft with minimal tenderness.  There is moderate ecchymosis in the right upper quadrant epigastric area.  His incisions are well-healed.  There is no peritonitis  Disposition: Discharge disposition: 01-Home or Self Care        Allergies as of  12/09/2023   No Known Allergies      Medication List     PAUSE taking these medications    cholestyramine  4 g packet Wait to take this until your doctor or other care provider tells you to start again. Until you start having diarrhea again Commonly known as: QUESTRAN  Take 1 packet (4 g total) by mouth 2 (two) times daily. What changed: when to take this       TAKE these medications    TYLENOL  500 MG tablet Generic drug: acetaminophen  Take 500-1,000 mg by mouth every 6 (six) hours as needed (for pain).   acetaminophen  325 MG tablet Commonly known as: TYLENOL  Take 2 tablets (650 mg total) by mouth every 6 (six) hours as needed for mild pain (pain score 1-3), fever or headache.   amLODipine  5 MG tablet Commonly known as: NORVASC  Take 5 mg by mouth at bedtime.   amoxicillin -clavulanate 875-125 MG tablet Commonly known as: AUGMENTIN  Take 1 tablet by mouth 2 (two) times daily.   anagrelide  1 MG capsule Commonly known as: AGRYLIN TAKE 1 CAPSULE(1 MG) BY MOUTH DAILY What changed: See the new instructions.   aspirin  EC 81 MG tablet Take 81 mg by mouth 2 (two) times a week. Swallow whole.   cetirizine 10 MG tablet Commonly known as: ZYRTEC Take 10 mg by mouth daily as needed for allergies.   dextromethorphan-guaiFENesin  30-600 MG 12hr tablet Commonly known as: MUCINEX  DM Take 1 tablet by mouth 2 (two) times daily for 7 days. What changed:  when to take this reasons to take this   docusate sodium  100 MG capsule  Commonly known as: COLACE Take 1 capsule (100 mg total) by mouth 2 (two) times daily as needed for mild constipation.   epoetin  alfa-epbx 40000 UNIT/ML injection Commonly known as: RETACRIT  Inject 40,000 Units into the skin See admin instructions. Inject 40,000 units into the skin every 7 days as needed, depending on bloodwork   ferrous sulfate  325 (65 FE) MG EC tablet Take 1 tablet (325 mg total) by mouth daily with breakfast.   fluticasone  50 MCG/ACT  nasal spray Commonly known as: FLONASE  Place 1 spray into both nostrils daily for 7 days. What changed:  how much to take when to take this reasons to take this   gabapentin  100 MG capsule Commonly known as: NEURONTIN  TAKE 1 CAPSULE BY MOUTH TWICE DAILY AND 2 CAPSULES BY MOUTH EVERY EVENING What changed:  how much to take how to take this when to take this additional instructions   hyoscyamine  0.125 MG SL tablet Commonly known as: LEVSIN  SL Take 1 tablet (0.125 mg total) by mouth every 6 (six) hours as needed. What changed:  how to take this reasons to take this   ipratropium 0.06 % nasal spray Commonly known as: ATROVENT Place 1 spray into both nostrils daily as needed for rhinitis.   luspatercept -aamt 25 MG subcutaneous injection Commonly known as: REBLOZYL  Inject 25 mg into the skin See admin instructions. Inject 25 mg into the skin every 3 weeks   methocarbamol  500 MG tablet Commonly known as: ROBAXIN  Take 1 tablet (500 mg total) by mouth every 8 (eight) hours as needed for muscle spasms.   Multivitamin Men 50+ Tabs Take 1 tablet by mouth 2 (two) times a week.   ondansetron  4 MG tablet Commonly known as: ZOFRAN  Take 1 tablet (4 mg total) by mouth every 8 (eight) hours as needed for nausea or vomiting.   oxyCODONE  5 MG immediate release tablet Commonly known as: Oxy IR/ROXICODONE  Take 1 tablet (5 mg total) by mouth every 6 (six) hours as needed for breakthrough pain.         Signed: Vicenta Poli 12/09/2023, 9:05 AM

## 2023-12-12 ENCOUNTER — Inpatient Hospital Stay

## 2023-12-12 ENCOUNTER — Inpatient Hospital Stay: Attending: Internal Medicine

## 2023-12-12 VITALS — BP 144/81 | HR 75 | Temp 97.8°F | Resp 16

## 2023-12-12 DIAGNOSIS — Z79899 Other long term (current) drug therapy: Secondary | ICD-10-CM | POA: Insufficient documentation

## 2023-12-12 DIAGNOSIS — D461 Refractory anemia with ring sideroblasts: Secondary | ICD-10-CM | POA: Insufficient documentation

## 2023-12-12 DIAGNOSIS — D469 Myelodysplastic syndrome, unspecified: Secondary | ICD-10-CM

## 2023-12-12 LAB — CMP (CANCER CENTER ONLY)
ALT: 19 U/L (ref 0–44)
AST: 12 U/L — ABNORMAL LOW (ref 15–41)
Albumin: 4.4 g/dL (ref 3.5–5.0)
Alkaline Phosphatase: 115 U/L (ref 38–126)
Anion gap: 7 (ref 5–15)
BUN: 22 mg/dL (ref 8–23)
CO2: 24 mmol/L (ref 22–32)
Calcium: 9.2 mg/dL (ref 8.9–10.3)
Chloride: 110 mmol/L (ref 98–111)
Creatinine: 1.12 mg/dL (ref 0.61–1.24)
GFR, Estimated: >60 mL/min (ref >60)
Glucose, Bld: 95 mg/dL (ref 70–99)
Potassium: 4.3 mmol/L (ref 3.5–5.1)
Sodium: 141 mmol/L (ref 135–145)
Total Bilirubin: 0.6 mg/dL (ref 0.0–1.2)
Total Protein: 7.5 g/dL (ref 6.5–8.1)

## 2023-12-12 LAB — CBC WITH DIFFERENTIAL (CANCER CENTER ONLY)
Abs Immature Granulocytes: 5.1 K/uL — ABNORMAL HIGH (ref 0.00–0.07)
Basophils Absolute: 0.6 K/uL — ABNORMAL HIGH (ref 0.0–0.1)
Basophils Relative: 2 %
Eosinophils Absolute: 1 K/uL — ABNORMAL HIGH (ref 0.0–0.5)
Eosinophils Relative: 2 %
HCT: 31.1 % — ABNORMAL LOW (ref 39.0–52.0)
Hemoglobin: 9.7 g/dL — ABNORMAL LOW (ref 13.0–17.0)
Immature Granulocytes: 12 %
Lymphocytes Relative: 7 %
Lymphs Abs: 2.9 K/uL (ref 0.7–4.0)
MCH: 26.3 pg (ref 26.0–34.0)
MCHC: 31.2 g/dL (ref 30.0–36.0)
MCV: 84.3 fL (ref 80.0–100.0)
Monocytes Absolute: 1.5 K/uL — ABNORMAL HIGH (ref 0.1–1.0)
Monocytes Relative: 3 %
Neutro Abs: 33 K/uL — ABNORMAL HIGH (ref 1.7–7.7)
Neutrophils Relative %: 74 %
Platelet Count: 311 K/uL (ref 150–400)
RBC: 3.69 MIL/uL — ABNORMAL LOW (ref 4.22–5.81)
RDW: 34.4 % — ABNORMAL HIGH (ref 11.5–15.5)
WBC Count: 44.1 K/uL — ABNORMAL HIGH (ref 4.0–10.5)
nRBC: 0.4 % — ABNORMAL HIGH (ref 0.0–0.2)

## 2023-12-12 MED ORDER — EPOETIN ALFA-EPBX 40000 UNIT/ML IJ SOLN
40000.0000 [IU] | Freq: Once | INTRAMUSCULAR | Status: AC
  Administered 2023-12-12: 40000 [IU] via SUBCUTANEOUS
  Filled 2023-12-12: qty 1

## 2023-12-19 ENCOUNTER — Inpatient Hospital Stay

## 2023-12-19 ENCOUNTER — Encounter: Payer: Self-pay | Admitting: Physician Assistant

## 2023-12-19 VITALS — BP 142/78 | HR 80 | Temp 97.6°F | Resp 17

## 2023-12-19 DIAGNOSIS — D461 Refractory anemia with ring sideroblasts: Secondary | ICD-10-CM | POA: Diagnosis not present

## 2023-12-19 DIAGNOSIS — D469 Myelodysplastic syndrome, unspecified: Secondary | ICD-10-CM

## 2023-12-19 LAB — CBC WITH DIFFERENTIAL (CANCER CENTER ONLY)
Abs Immature Granulocytes: 3.42 K/uL — ABNORMAL HIGH (ref 0.00–0.07)
Basophils Absolute: 0.5 K/uL — ABNORMAL HIGH (ref 0.0–0.1)
Basophils Relative: 1 %
Eosinophils Absolute: 0.8 K/uL — ABNORMAL HIGH (ref 0.0–0.5)
Eosinophils Relative: 2 %
HCT: 32.2 % — ABNORMAL LOW (ref 39.0–52.0)
Hemoglobin: 10.2 g/dL — ABNORMAL LOW (ref 13.0–17.0)
Immature Granulocytes: 8 %
Lymphocytes Relative: 8 %
Lymphs Abs: 3.6 K/uL (ref 0.7–4.0)
MCH: 26.6 pg (ref 26.0–34.0)
MCHC: 31.7 g/dL (ref 30.0–36.0)
MCV: 84.1 fL (ref 80.0–100.0)
Monocytes Absolute: 1.5 K/uL — ABNORMAL HIGH (ref 0.1–1.0)
Monocytes Relative: 3 %
Neutro Abs: 33.9 K/uL — ABNORMAL HIGH (ref 1.7–7.7)
Neutrophils Relative %: 78 %
Platelet Count: 216 K/uL (ref 150–400)
RBC: 3.83 MIL/uL — ABNORMAL LOW (ref 4.22–5.81)
RDW: 33.2 % — ABNORMAL HIGH (ref 11.5–15.5)
WBC Count: 43.7 K/uL — ABNORMAL HIGH (ref 4.0–10.5)
nRBC: 0.3 % — ABNORMAL HIGH (ref 0.0–0.2)

## 2023-12-19 MED ORDER — EPOETIN ALFA-EPBX 40000 UNIT/ML IJ SOLN
40000.0000 [IU] | Freq: Once | INTRAMUSCULAR | Status: AC
  Administered 2023-12-19: 40000 [IU] via SUBCUTANEOUS
  Filled 2023-12-19: qty 1

## 2023-12-26 ENCOUNTER — Other Ambulatory Visit: Payer: Self-pay | Admitting: Neurology

## 2023-12-26 ENCOUNTER — Inpatient Hospital Stay

## 2023-12-26 VITALS — BP 144/81 | HR 80 | Temp 97.6°F | Resp 16 | Wt 146.5 lb

## 2023-12-26 DIAGNOSIS — D469 Myelodysplastic syndrome, unspecified: Secondary | ICD-10-CM

## 2023-12-26 DIAGNOSIS — D461 Refractory anemia with ring sideroblasts: Secondary | ICD-10-CM | POA: Diagnosis not present

## 2023-12-26 LAB — CBC WITH DIFFERENTIAL (CANCER CENTER ONLY)
Abs Immature Granulocytes: 2.2 K/uL — ABNORMAL HIGH (ref 0.00–0.07)
Basophils Absolute: 0.3 K/uL — ABNORMAL HIGH (ref 0.0–0.1)
Basophils Relative: 1 %
Eosinophils Absolute: 0.7 K/uL — ABNORMAL HIGH (ref 0.0–0.5)
Eosinophils Relative: 2 %
HCT: 31.6 % — ABNORMAL LOW (ref 39.0–52.0)
Hemoglobin: 10 g/dL — ABNORMAL LOW (ref 13.0–17.0)
Immature Granulocytes: 6 %
Lymphocytes Relative: 7 %
Lymphs Abs: 2.3 K/uL (ref 0.7–4.0)
MCH: 27 pg (ref 26.0–34.0)
MCHC: 31.6 g/dL (ref 30.0–36.0)
MCV: 85.4 fL (ref 80.0–100.0)
Monocytes Absolute: 0.9 K/uL (ref 0.1–1.0)
Monocytes Relative: 3 %
Neutro Abs: 28.1 K/uL — ABNORMAL HIGH (ref 1.7–7.7)
Neutrophils Relative %: 81 %
Platelet Count: 164 K/uL (ref 150–400)
RBC: 3.7 MIL/uL — ABNORMAL LOW (ref 4.22–5.81)
RDW: 32.6 % — ABNORMAL HIGH (ref 11.5–15.5)
WBC Count: 34.5 K/uL — ABNORMAL HIGH (ref 4.0–10.5)
nRBC: 0.3 % — ABNORMAL HIGH (ref 0.0–0.2)

## 2023-12-26 LAB — CMP (CANCER CENTER ONLY)
ALT: 11 U/L (ref 0–44)
AST: 11 U/L — ABNORMAL LOW (ref 15–41)
Albumin: 4.5 g/dL (ref 3.5–5.0)
Alkaline Phosphatase: 96 U/L (ref 38–126)
Anion gap: 5 (ref 5–15)
BUN: 22 mg/dL (ref 8–23)
CO2: 28 mmol/L (ref 22–32)
Calcium: 9.6 mg/dL (ref 8.9–10.3)
Chloride: 107 mmol/L (ref 98–111)
Creatinine: 1.4 mg/dL — ABNORMAL HIGH (ref 0.61–1.24)
GFR, Estimated: 51 mL/min — ABNORMAL LOW (ref >60)
Glucose, Bld: 94 mg/dL (ref 70–99)
Potassium: 4.5 mmol/L (ref 3.5–5.1)
Sodium: 140 mmol/L (ref 135–145)
Total Bilirubin: 0.6 mg/dL (ref 0.0–1.2)
Total Protein: 7.3 g/dL (ref 6.5–8.1)

## 2023-12-26 MED ORDER — EPOETIN ALFA-EPBX 40000 UNIT/ML IJ SOLN
40000.0000 [IU] | Freq: Once | INTRAMUSCULAR | Status: AC
  Administered 2023-12-26: 40000 [IU] via SUBCUTANEOUS
  Filled 2023-12-26: qty 1

## 2023-12-26 NOTE — Progress Notes (Signed)
 Hgb 10.0 per Almarie with lab. Okay to proceed with Retacrit .  Harlene Nasuti, PharmD Oncology Infusion Pharmacist 12/26/2023 8:58 AM

## 2023-12-26 NOTE — Patient Instructions (Signed)

## 2023-12-27 NOTE — Progress Notes (Unsigned)
 Patient: Roy Johnson Date of Birth: 08/12/1945  Reason for Visit: Follow up History from: Patient Primary Neurologist: Onita  ASSESSMENT AND PLAN 78 y.o. year old male   1.  Mild cognitive impairment 2.  Right foot paresthesia, right lumbar radiculopathy 3.  Gait unsteadiness, urinary urgency 4.  Excessive daytime sleepiness 5.  Myelodysplastic syndrome  -Will complete labs Dr. Onita ordered in November (vitamin D, copper , sed rate, CRP, ANA, B12, RPR) after MRI of cervical spine showed C5-C6 signal abnormality in the medial posterior columns, potentially due to demyelination, B12 or copper  deficiency, borderline spinal stenosis at C4-C5 and C5-C6. -MoCA 30/30 -EMG nerve conduction study in January 2022 showed no large fiber peripheral neuropathy  -MRI of the brain November 2022 showed small vessel disease no acute abnormality  -Continue gabapentin  up to 100 mg twice daily, 200 mg at bedtime for paresthesia to hands/feet -Encouraged to consider CPAP, reports sleep study was marginal, would encourage a trial of CPAP to see if benefit for the fatigue, memory, daytime sleepiness -If labs unremarkable, review with Dr. Onita is repeat MRI cervical spine needed? Repeat EMG upper and lower? -Follow-up in 6 months or sooner if needed  HISTORY  Roy Johnson is a 78 year old male, his primary care physician is Dr. Yolande, [place], return to follow-up for cognitive impairment, new onset left visual loss in June 2022   I reviewed and summarized the referring note. PMHx. 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 Gatha,   He was a patient of Dr. 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  Still functioning daily activity, driving without difficulty, able to keep his checkbook imbalance, today's Mini-Mental status 29/30  I personally reviewed MRI of the brain in 2019 which showed no significant abnormalities. Laboratory evaluation showed normal CMP, TSH, B12,   Today his main concern is 1 episode of sudden onset left visual loss in June 2022, lasting for 30 seconds, like a thick mesh covered his left eye, no headache, no lateralized motor or sensory deficit.  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 Vear January 2022, no evidence of polyneuropathy, no evidence of active lumbar radiculopathy, mild right carpal tunnel   UPDATE Apr 07 2021: Is overall doing very well, no significant changes since last visit 6 months ago, no longer has sudden visual loss, complains of right lumbar radicular pain, mild gait difficulty due to that  We personally reviewed MRI of brain in November 2022, age-appropriate small vessel disease, no acute abnormality MRA of head and neck showed no large vessel disease,  He continues to complain of occasionally episodes of overwhelming sleepiness, fatigue episode, also complains of mild memory loss,   UPDATE Feb 08 2022: He continue complains of short term memory loss, word finding difficulties, which is a significant change compared to previous baseline, MOCA  examination  28/80 today,   He denies loud snoring, but he tends to sleep on his stomach, complains of daytime sleepiness, required nap often, he also has myelodysplastic syndrome, might contributed to his complaints of fatigue   He is also concerned about mild unsteadiness, has not been physically active, recovered from previous episode of low back pain, right lower extremity radiating pain, he is also concerned about his slow worsening urinary urgency, occasionally accident has to wear pads now, in summer 2023, he suffered frequent diarrhea, stool pathology was positive for C. difficile, norovirus was treated with vancomycin for 2 weeks, repeat C. difficile testing was negative, irritable bowel syndrome, followed by  GI Dr. Norleen Kiang,  Personally reviewed MRI of the brain without contrast November 2022, mild small vessel disease, age-appropriate atrophy   MRA of brain and neck showed no large vessel disease   He is a caregiver of his wife who suffered Parkinson's disease, this made him very concerned about his health issues to make sure he can take care of his wife  Update Aug 25, 2022 SS: reading the newspaper, today, MOCA 30/30. Hands and feet feel like pins and needles. Hands can be weak, when hurting doesn't want to us . His balance is more uncertain, he doesn't fall, loses his equilibrium slightly. Still has urinary urgency. His main concerns are cognitive issues. Mentions last night, slept 8 hours, ready for nap in the morning. Can fall asleep easily. Trouble to visually focus reading, has to force his eyes to focus, unclear why? Has word finding trouble, not noticed today. Mentions fatigue, he can fight through it, but any opportunity to crash he will. He spends Jan-May in [place] , he just drove back. PCP referred for sleep study, was marginal for sleep apnea. At night the tingling in his fingers is bothersome, only takes 100 mg at bedtime, 200 mg makes him sleepy.   MRI cervical spine showed mild  degenerative changes, small signal abnormality at C5-6.  Dr. Onita ordered labs that have not been completed yet.  IMPRESSION: This MRI of the cervical spine without contrast shows the following: Adjacent to C5-C6, there is a small focus of T2/STIR hyperintensity in the medial posterior columns.  This is nonspecific and could be due to demyelination, B12 or copper  deficiency.  The appearance is less typical for compressive myelopathy. Multilevel degenerative changes as detailed above.  This causes borderline spinal stenosis at C4-C5 and C5-C6.  There is no nerve root compression.  Update December 28, 2023 SS:   REVIEW OF SYSTEMS: Out of a complete 14 system review of symptoms, the patient complains only of the following symptoms, and all other reviewed systems are negative.  See HPI  ALLERGIES: No Known Allergies  HOME MEDICATIONS: Outpatient Medications Prior to Visit  Medication Sig Dispense Refill   acetaminophen  (TYLENOL ) 325 MG tablet Take 2 tablets (650 mg total) by mouth every 6 (six) hours as needed for mild pain (pain score 1-3), fever or headache. (Patient not taking: Reported on 12/07/2023)     acetaminophen  (TYLENOL ) 500 MG tablet Take 500-1,000 mg by mouth every 6 (six) hours as needed (for pain).     amLODipine  (NORVASC ) 5 MG tablet Take 5 mg by mouth at bedtime.  2   amoxicillin -clavulanate (AUGMENTIN ) 875-125 MG tablet Take 1 tablet by mouth 2 (two) times daily. 14 tablet 0   anagrelide  (  AGRYLIN) 1 MG capsule TAKE 1 CAPSULE(1 MG) BY MOUTH DAILY (Patient taking differently: Take 1 mg by mouth in the morning and at bedtime.) 90 capsule 0   aspirin  EC 81 MG tablet Take 81 mg by mouth 2 (two) times a week. Swallow whole.     cetirizine (ZYRTEC) 10 MG tablet Take 10 mg by mouth daily as needed for allergies.     cholestyramine  (QUESTRAN ) 4 g packet Take 1 packet (4 g total) by mouth 2 (two) times daily. (Patient taking differently: Take 4 g by mouth at bedtime.) 180 packet 3    docusate sodium  (COLACE) 100 MG capsule Take 1 capsule (100 mg total) by mouth 2 (two) times daily as needed for mild constipation. (Patient not taking: Reported on 12/07/2023) 10 capsule 0   epoetin  alfa-epbx (RETACRIT ) 40000 UNIT/ML injection Inject 40,000 Units into the skin See admin instructions. Inject 40,000 units into the skin every 7 days as needed, depending on bloodwork     ferrous sulfate  325 (65 FE) MG EC tablet Take 1 tablet (325 mg total) by mouth daily with breakfast. (Patient not taking: Reported on 12/07/2023) 30 tablet 0   fluticasone  (FLONASE ) 50 MCG/ACT nasal spray Place 1 spray into both nostrils daily for 7 days. (Patient taking differently: Place 1-2 sprays into both nostrils daily as needed for allergies or rhinitis.) 1 g 0   gabapentin  (NEURONTIN ) 100 MG capsule TAKE 1 CAPSULE BY MOUTH TWICE DAILY AND 2 CAPSULES BY MOUTH EVERY EVENING 120 capsule 0   hyoscyamine  (LEVSIN  SL) 0.125 MG SL tablet Take 1 tablet (0.125 mg total) by mouth every 6 (six) hours as needed. (Patient taking differently: Place 0.125 mg under the tongue every 6 (six) hours as needed for cramping.) 60 tablet 11   ipratropium (ATROVENT) 0.06 % nasal spray Place 1 spray into both nostrils daily as needed for rhinitis.     luspatercept -aamt (REBLOZYL ) 25 MG subcutaneous injection Inject 25 mg into the skin See admin instructions. Inject 25 mg into the skin every 3 weeks     methocarbamol  (ROBAXIN ) 500 MG tablet Take 1 tablet (500 mg total) by mouth every 8 (eight) hours as needed for muscle spasms. (Patient not taking: Reported on 12/07/2023) 30 tablet 0   Multiple Vitamins-Minerals (MULTIVITAMIN MEN 50+) TABS Take 1 tablet by mouth 2 (two) times a week.     ondansetron  (ZOFRAN ) 4 MG tablet Take 1 tablet (4 mg total) by mouth every 8 (eight) hours as needed for nausea or vomiting. (Patient not taking: Reported on 12/07/2023) 20 tablet 0   oxyCODONE  (OXY IR/ROXICODONE ) 5 MG immediate release tablet Take 1 tablet (5 mg  total) by mouth every 6 (six) hours as needed for breakthrough pain. 15 tablet 0   No facility-administered medications prior to visit.    PAST MEDICAL HISTORY: Past Medical History:  Diagnosis Date   Anemia    Anesthesia of skin    Calculus of gallbladder with acute cholecystitis, with obstruction    Cellulitis of back    Chest pain    Chronic kidney disease (CKD), stage III (moderate) (HCC)    Colon polyp    Cough    Disorientation    Diverticulitis    Diverticulosis    Dyspnea on exertion    Elevated white blood cell count    Fatigue    Forgetfulness    GERD (gastroesophageal reflux disease)    Hearing loss    HTN (hypertension)    Hydrocele    Hyperkalemia  Hyperlipidemia    Hypertensive chronic kidney disease with stage 1 through stage 4 chronic kidney disease, or unspecified chronic kidney disease    Hypokalemia    IBS (irritable bowel syndrome)    Impacted cerumen, bilateral    Inguinal hernia    Insomnia    Lightheadedness    Liver disease    Lower abdominal pain    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   Mixed stress and urge urinary incontinence    Myelodysplastic syndrome (HCC)    Nausea with vomiting    Numbness and tingling in both hands    Other amnesia    Other benign neoplasm of skin, unspecified    Overflow incontinence of urine    Pain in right leg    Paresthesia of skin    Prostate cancer (HCC)    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)    Right upper quadrant pain    Sinusitis, acute    SOB (shortness of breath)    Thrombocythemia    Umbilical hernia    Vitamin D deficiency    Weakness     PAST SURGICAL HISTORY: Past Surgical History:  Procedure Laterality Date   CHOLECYSTECTOMY N/A 12/02/2023   Procedure: LAPAROSCOPIC CHOLECYSTECTOMY;  Surgeon: Kinsinger, Herlene Righter, MD;  Location: [facility] OR;  Service: General;  Laterality: N/A;   COLONOSCOPY     POLYPECTOMY     PROSTATECTOMY     TONSILLECTOMY      FAMILY HISTORY: Family  History  Problem Relation Age of Onset   Osteoarthritis Mother    Breast cancer Mother    Heart disease Father    Colon polyps Father    Lung cancer Father        cancerous mole   Prostate cancer Father    Colon cancer Neg Hx    Esophageal cancer Neg Hx    Rectal cancer Neg Hx    Stomach cancer Neg Hx    Pancreatic cancer Neg Hx     SOCIAL HISTORY: Social History   Socioeconomic History   Marital status: Married    Spouse name: Not on file   Number of children: 2   Years of education: college   Highest education level: Not on file  Occupational History   Occupation: retired  Tobacco Use   Smoking status: Former    Current packs/day: 0.00    Types: Cigarettes    Quit date: 09/07/1973    Years since quitting: 50.3   Smokeless tobacco: Never  Vaping Use   Vaping status: Never Used  Substance and Sexual Activity   Alcohol use: Yes   Drug use: No   Sexual activity: Yes  Other Topics Concern   Not on file  Social History Narrative   Lives with wife.   Right-handed.   1 cup caffeine per day.   Social Drivers of Corporate investment banker Strain: Not on file  Food Insecurity: No Food Insecurity (12/07/2023)   Hunger Vital Sign    Worried About Running Out of Food in the Last Year: Never true    Ran Out of Food in the Last Year: Never true  Transportation Needs: No Transportation Needs (12/07/2023)   PRAPARE - Administrator, Civil Service (Medical): No    Lack of Transportation (Non-Medical): No  Physical Activity: Not on file  Stress: Not on file  Social Connections: Moderately Integrated (12/07/2023)   Social Connection and Isolation Panel    Frequency of Communication with Friends and Family: More  than three times a week    Frequency of Social Gatherings with Friends and Family: More than three times a week    Attends Religious Services: 1 to 4 times per year    Active Member of [employer] or Organizations: No    Attends Banker Meetings:  Never    Marital Status: Married  Catering manager Violence: Not At Risk (12/07/2023)   Humiliation, Afraid, Rape, and Kick questionnaire    Fear of Current or Ex-Partner: No    Emotionally Abused: No    Physically Abused: No    Sexually Abused: No    PHYSICAL EXAM  There were no vitals filed for this visit.  There is no height or weight on file to calculate BMI.    02/08/2022   11:29 AM 04/07/2021    4:00 PM  Montreal Cognitive Assessment   Visuospatial/ Executive (0/5) 5 5  Naming (0/3) 3 3  Attention: Read list of digits (0/2) 2 2  Attention: Read list of letters (0/1) 1 1  Attention: Serial 7 subtraction starting at 100 (0/3) 3 3  Language: Repeat phrase (0/2) 2 2  Language : Fluency (0/1) 1 1  Abstraction (0/2) 2 2  Delayed Recall (0/5) 3 4  Orientation (0/6) 6 6  Total 28 29  Adjusted Score (based on education) 28    Generalized: Well developed, in no acute distress  Neurological examination  Mentation: Alert oriented to time, place, history taking. Follows all commands speech and language fluent Cranial nerve II-XII: Pupils were equal round reactive to light. Extraocular movements were full, visual field were full on confrontational test. Facial sensation and strength were normal. Head turning and shoulder shrug  were normal and symmetric. Motor: The motor testing reveals 5 over 5 strength of all 4 extremities. Good symmetric motor tone is noted throughout.  Sensory: Sensory testing is intact to soft touch on all 4 extremities. No evidence of extinction is noted.  Coordination: Cerebellar testing reveals good finger-nose-finger and heel-to-shin bilaterally.  Gait and station: Gait is normal. Tandem gait is steady. Reflexes: Deep tendon reflexes are symmetric, slightly increased at the knees  DIAGNOSTIC DATA (LABS, IMAGING, TESTING) - I reviewed patient records, labs, notes, testing and imaging myself where available.  Lab Results  Component Value Date   WBC 34.5  (H) 12/26/2023   HGB 10.0 (L) 12/26/2023   HCT 31.6 (L) 12/26/2023   MCV 85.4 12/26/2023   PLT 164 12/26/2023      Component Value Date/Time   NA 140 12/26/2023 0805   NA 141 12/08/2016 0928   K 4.5 12/26/2023 0805   K 4.5 12/08/2016 0928   CL 107 12/26/2023 0805   CO2 28 12/26/2023 0805   CO2 27 12/08/2016 0928   GLUCOSE 94 12/26/2023 0805   GLUCOSE 150 (H) 12/08/2016 0928   BUN 22 12/26/2023 0805   BUN 14.1 12/08/2016 0928   CREATININE 1.40 (H) 12/26/2023 0805   CREATININE 0.9 12/08/2016 0928   CALCIUM  9.6 12/26/2023 0805   CALCIUM  9.0 12/08/2016 0928   PROT 7.3 12/26/2023 0805   PROT 7.3 04/01/2020 0954   PROT 6.6 12/08/2016 0928   ALBUMIN 4.5 12/26/2023 0805   ALBUMIN 3.7 12/08/2016 0928   AST 11 (L) 12/26/2023 0805   AST 25 12/08/2016 0928   ALT 11 12/26/2023 0805   ALT 41 12/08/2016 0928   ALKPHOS 96 12/26/2023 0805   ALKPHOS 87 12/08/2016 0928   BILITOT 0.6 12/26/2023 0805   BILITOT 0.84 12/08/2016 9071  GFRNONAA 51 (L) 12/26/2023 0805   GFRAA >60 01/13/2020 0929   No results found for: CHOL, HDL, LDLCALC, LDLDIRECT, TRIG, CHOLHDL No results found for: YHAJ8R Lab Results  Component Value Date   VITAMINB12 988 (H) 11/28/2023   Lab Results  Component Value Date   TSH 2.440 04/07/2021    Lauraine Born, AGNP-C, DNP 12/27/2023, 4:23 PM [facility] [address], [address] [place], [place] [address] [phone]

## 2023-12-28 ENCOUNTER — Encounter: Payer: Self-pay | Admitting: Neurology

## 2023-12-28 ENCOUNTER — Ambulatory Visit: Admitting: Neurology

## 2023-12-28 VITALS — BP 142/84 | HR 82 | Ht 68.0 in | Wt 147.2 lb

## 2023-12-28 DIAGNOSIS — G3184 Mild cognitive impairment, so stated: Secondary | ICD-10-CM | POA: Diagnosis not present

## 2023-12-28 DIAGNOSIS — R202 Paresthesia of skin: Secondary | ICD-10-CM | POA: Diagnosis not present

## 2023-12-28 DIAGNOSIS — D469 Myelodysplastic syndrome, unspecified: Secondary | ICD-10-CM | POA: Diagnosis not present

## 2023-12-28 DIAGNOSIS — R2681 Unsteadiness on feet: Secondary | ICD-10-CM | POA: Diagnosis not present

## 2023-12-28 MED ORDER — GABAPENTIN 100 MG PO CAPS: ORAL_CAPSULE | ORAL | 1 refills | Status: AC

## 2023-12-28 NOTE — Patient Instructions (Signed)
 Very nice to see you today I will refill your gabapentin  today, will kindly ask your primary care to refill going forward.  I will review your MRI cervical spine with Dr. Onita to see if repeating would be of benefit.  Otherwise, please keep close follow-up with your established providers.  For any new or worsening issue please come back to our office.  Thanks

## 2024-01-02 ENCOUNTER — Inpatient Hospital Stay

## 2024-01-02 VITALS — BP 150/79 | HR 79 | Temp 97.9°F | Resp 17

## 2024-01-02 DIAGNOSIS — D461 Refractory anemia with ring sideroblasts: Secondary | ICD-10-CM | POA: Diagnosis not present

## 2024-01-02 DIAGNOSIS — D469 Myelodysplastic syndrome, unspecified: Secondary | ICD-10-CM

## 2024-01-02 LAB — CBC WITH DIFFERENTIAL (CANCER CENTER ONLY)
Abs Immature Granulocytes: 2.67 K/uL — ABNORMAL HIGH (ref 0.00–0.07)
Basophils Absolute: 0.3 K/uL — ABNORMAL HIGH (ref 0.0–0.1)
Basophils Relative: 1 %
Eosinophils Absolute: 1 K/uL — ABNORMAL HIGH (ref 0.0–0.5)
Eosinophils Relative: 3 %
HCT: 32.2 % — ABNORMAL LOW (ref 39.0–52.0)
Hemoglobin: 10.1 g/dL — ABNORMAL LOW (ref 13.0–17.0)
Immature Granulocytes: 8 %
Lymphocytes Relative: 8 %
Lymphs Abs: 2.6 K/uL (ref 0.7–4.0)
MCH: 27.3 pg (ref 26.0–34.0)
MCHC: 31.4 g/dL (ref 30.0–36.0)
MCV: 87 fL (ref 80.0–100.0)
Monocytes Absolute: 1.1 K/uL — ABNORMAL HIGH (ref 0.1–1.0)
Monocytes Relative: 4 %
Neutro Abs: 25.1 K/uL — ABNORMAL HIGH (ref 1.7–7.7)
Neutrophils Relative %: 76 %
Platelet Count: 232 K/uL (ref 150–400)
RBC: 3.7 MIL/uL — ABNORMAL LOW (ref 4.22–5.81)
RDW: 32.8 % — ABNORMAL HIGH (ref 11.5–15.5)
WBC Count: 32.8 K/uL — ABNORMAL HIGH (ref 4.0–10.5)
nRBC: 0.4 % — ABNORMAL HIGH (ref 0.0–0.2)

## 2024-01-02 MED ORDER — EPOETIN ALFA-EPBX 40000 UNIT/ML IJ SOLN
40000.0000 [IU] | Freq: Once | INTRAMUSCULAR | Status: AC
  Administered 2024-01-02: 40000 [IU] via SUBCUTANEOUS
  Filled 2024-01-02: qty 1

## 2024-01-02 NOTE — Progress Notes (Signed)
 Per Almarie in the lab, patients hgb is 10.1.  Can proceed with retacrit  injection.

## 2024-01-09 ENCOUNTER — Inpatient Hospital Stay

## 2024-01-09 VITALS — BP 148/81 | HR 79 | Temp 98.0°F | Resp 19

## 2024-01-09 DIAGNOSIS — D469 Myelodysplastic syndrome, unspecified: Secondary | ICD-10-CM

## 2024-01-09 DIAGNOSIS — D461 Refractory anemia with ring sideroblasts: Secondary | ICD-10-CM | POA: Diagnosis not present

## 2024-01-09 LAB — CBC WITH DIFFERENTIAL (CANCER CENTER ONLY)
Abs Immature Granulocytes: 3.37 K/uL — ABNORMAL HIGH (ref 0.00–0.07)
Basophils Absolute: 0.4 K/uL — ABNORMAL HIGH (ref 0.0–0.1)
Basophils Relative: 1 %
Eosinophils Absolute: 0.9 K/uL — ABNORMAL HIGH (ref 0.0–0.5)
Eosinophils Relative: 2 %
HCT: 35.8 % — ABNORMAL LOW (ref 39.0–52.0)
Hemoglobin: 11.1 g/dL — ABNORMAL LOW (ref 13.0–17.0)
Immature Granulocytes: 9 %
Lymphocytes Relative: 7 %
Lymphs Abs: 2.7 K/uL (ref 0.7–4.0)
MCH: 27.1 pg (ref 26.0–34.0)
MCHC: 31 g/dL (ref 30.0–36.0)
MCV: 87.5 fL (ref 80.0–100.0)
Monocytes Absolute: 1.5 K/uL — ABNORMAL HIGH (ref 0.1–1.0)
Monocytes Relative: 4 %
Neutro Abs: 27.9 K/uL — ABNORMAL HIGH (ref 1.7–7.7)
Neutrophils Relative %: 77 %
Platelet Count: 276 K/uL (ref 150–400)
RBC: 4.09 MIL/uL — ABNORMAL LOW (ref 4.22–5.81)
RDW: 32.7 % — ABNORMAL HIGH (ref 11.5–15.5)
WBC Count: 36.8 K/uL — ABNORMAL HIGH (ref 4.0–10.5)
nRBC: 0.5 % — ABNORMAL HIGH (ref 0.0–0.2)

## 2024-01-09 LAB — CMP (CANCER CENTER ONLY)
ALT: 10 U/L (ref 0–44)
AST: 10 U/L — ABNORMAL LOW (ref 15–41)
Albumin: 4.9 g/dL (ref 3.5–5.0)
Alkaline Phosphatase: 88 U/L (ref 38–126)
Anion gap: 5 (ref 5–15)
BUN: 28 mg/dL — ABNORMAL HIGH (ref 8–23)
CO2: 28 mmol/L (ref 22–32)
Calcium: 10 mg/dL (ref 8.9–10.3)
Chloride: 105 mmol/L (ref 98–111)
Creatinine: 1.34 mg/dL — ABNORMAL HIGH (ref 0.61–1.24)
GFR, Estimated: 54 mL/min — ABNORMAL LOW (ref >60)
Glucose, Bld: 99 mg/dL (ref 70–99)
Potassium: 4.8 mmol/L (ref 3.5–5.1)
Sodium: 138 mmol/L (ref 135–145)
Total Bilirubin: 0.6 mg/dL (ref 0.0–1.2)
Total Protein: 7.8 g/dL (ref 6.5–8.1)

## 2024-01-09 MED ORDER — EPOETIN ALFA-EPBX 40000 UNIT/ML IJ SOLN: 40000.0000 [IU] | Freq: Once | INTRAMUSCULAR | Status: DC

## 2024-01-09 NOTE — Progress Notes (Signed)
 As per RN Melissa B. Gujrati- since hgb > 11 will hold retacrit  today   Patient NOT GIVEN RETACRIT  TODAY.  This concludes the interaction.  Rosaline Minerva, LPN

## 2024-01-16 ENCOUNTER — Inpatient Hospital Stay

## 2024-01-16 ENCOUNTER — Inpatient Hospital Stay: Attending: Internal Medicine

## 2024-01-16 ENCOUNTER — Telehealth: Payer: Self-pay | Admitting: Medical Oncology

## 2024-01-16 VITALS — BP 133/78 | HR 80 | Temp 97.8°F | Resp 15

## 2024-01-16 DIAGNOSIS — D461 Refractory anemia with ring sideroblasts: Secondary | ICD-10-CM | POA: Insufficient documentation

## 2024-01-16 DIAGNOSIS — D469 Myelodysplastic syndrome, unspecified: Secondary | ICD-10-CM

## 2024-01-16 DIAGNOSIS — Z79899 Other long term (current) drug therapy: Secondary | ICD-10-CM | POA: Insufficient documentation

## 2024-01-16 LAB — CBC WITH DIFFERENTIAL (CANCER CENTER ONLY)
Abs Immature Granulocytes: 2.5 K/uL — ABNORMAL HIGH (ref 0.00–0.07)
Basophils Absolute: 0.3 K/uL — ABNORMAL HIGH (ref 0.0–0.1)
Basophils Relative: 1 %
Eosinophils Absolute: 1 K/uL — ABNORMAL HIGH (ref 0.0–0.5)
Eosinophils Relative: 3 %
HCT: 32 % — ABNORMAL LOW (ref 39.0–52.0)
Hemoglobin: 10.2 g/dL — ABNORMAL LOW (ref 13.0–17.0)
Immature Granulocytes: 9 %
Lymphocytes Relative: 8 %
Lymphs Abs: 2.4 K/uL (ref 0.7–4.0)
MCH: 27.3 pg (ref 26.0–34.0)
MCHC: 31.9 g/dL (ref 30.0–36.0)
MCV: 85.8 fL (ref 80.0–100.0)
Monocytes Absolute: 0.9 K/uL (ref 0.1–1.0)
Monocytes Relative: 3 %
Neutro Abs: 22.1 K/uL — ABNORMAL HIGH (ref 1.7–7.7)
Neutrophils Relative %: 76 %
Platelet Count: 336 K/uL (ref 150–400)
RBC: 3.73 MIL/uL — ABNORMAL LOW (ref 4.22–5.81)
RDW: 32.9 % — ABNORMAL HIGH (ref 11.5–15.5)
WBC Count: 29.2 K/uL — ABNORMAL HIGH (ref 4.0–10.5)
nRBC: 0.1 % (ref 0.0–0.2)

## 2024-01-16 LAB — CMP (CANCER CENTER ONLY)
ALT: 12 U/L (ref 0–44)
AST: 11 U/L — ABNORMAL LOW (ref 15–41)
Albumin: 4.4 g/dL (ref 3.5–5.0)
Alkaline Phosphatase: 83 U/L (ref 38–126)
Anion gap: 5 (ref 5–15)
BUN: 34 mg/dL — ABNORMAL HIGH (ref 8–23)
CO2: 26 mmol/L (ref 22–32)
Calcium: 9.6 mg/dL (ref 8.9–10.3)
Chloride: 109 mmol/L (ref 98–111)
Creatinine: 1.32 mg/dL — ABNORMAL HIGH (ref 0.61–1.24)
GFR, Estimated: 55 mL/min — ABNORMAL LOW (ref >60)
Glucose, Bld: 102 mg/dL — ABNORMAL HIGH (ref 70–99)
Potassium: 5.1 mmol/L (ref 3.5–5.1)
Sodium: 140 mmol/L (ref 135–145)
Total Bilirubin: 0.5 mg/dL (ref 0.0–1.2)
Total Protein: 7.5 g/dL (ref 6.5–8.1)

## 2024-01-16 MED ORDER — EPOETIN ALFA-EPBX 40000 UNIT/ML IJ SOLN
40000.0000 [IU] | Freq: Once | INTRAMUSCULAR | Status: AC
  Administered 2024-01-16: 40000 [IU] via SUBCUTANEOUS
  Filled 2024-01-16: qty 1

## 2024-01-16 NOTE — Telephone Encounter (Signed)
 Pt HGB is  10.2. Flush nurse and pharmacy notified.

## 2024-01-23 ENCOUNTER — Inpatient Hospital Stay

## 2024-01-23 ENCOUNTER — Telehealth: Payer: Self-pay

## 2024-01-23 VITALS — BP 137/74 | HR 76 | Temp 97.7°F | Resp 18

## 2024-01-23 DIAGNOSIS — D469 Myelodysplastic syndrome, unspecified: Secondary | ICD-10-CM

## 2024-01-23 DIAGNOSIS — D461 Refractory anemia with ring sideroblasts: Secondary | ICD-10-CM | POA: Diagnosis not present

## 2024-01-23 LAB — CBC WITH DIFFERENTIAL (CANCER CENTER ONLY)
Abs Immature Granulocytes: 1.82 K/uL — ABNORMAL HIGH (ref 0.00–0.07)
Basophils Absolute: 0.4 K/uL — ABNORMAL HIGH (ref 0.0–0.1)
Basophils Relative: 2 %
Eosinophils Absolute: 1.1 K/uL — ABNORMAL HIGH (ref 0.0–0.5)
Eosinophils Relative: 4 %
HCT: 30.2 % — ABNORMAL LOW (ref 39.0–52.0)
Hemoglobin: 9.7 g/dL — ABNORMAL LOW (ref 13.0–17.0)
Immature Granulocytes: 7 %
Lymphocytes Relative: 8 %
Lymphs Abs: 2.2 K/uL (ref 0.7–4.0)
MCH: 27.3 pg (ref 26.0–34.0)
MCHC: 32.1 g/dL (ref 30.0–36.0)
MCV: 85.1 fL (ref 80.0–100.0)
Monocytes Absolute: 1.6 K/uL — ABNORMAL HIGH (ref 0.1–1.0)
Monocytes Relative: 6 %
Neutro Abs: 19.5 K/uL — ABNORMAL HIGH (ref 1.7–7.7)
Neutrophils Relative %: 73 %
Platelet Count: 379 K/uL (ref 150–400)
RBC: 3.55 MIL/uL — ABNORMAL LOW (ref 4.22–5.81)
RDW: 32.7 % — ABNORMAL HIGH (ref 11.5–15.5)
WBC Count: 26.6 K/uL — ABNORMAL HIGH (ref 4.0–10.5)
nRBC: 0.2 % (ref 0.0–0.2)

## 2024-01-23 MED ORDER — EPOETIN ALFA-EPBX 40000 UNIT/ML IJ SOLN
40000.0000 [IU] | Freq: Once | INTRAMUSCULAR | Status: AC
  Administered 2024-01-23: 40000 [IU] via SUBCUTANEOUS
  Filled 2024-01-23: qty 1

## 2024-01-23 NOTE — Telephone Encounter (Signed)
 Per lab- patients hgb is 9.7.  Flush room and pharmacy notified.

## 2024-01-30 ENCOUNTER — Telehealth: Payer: Self-pay | Admitting: Medical Oncology

## 2024-01-30 ENCOUNTER — Inpatient Hospital Stay

## 2024-01-30 VITALS — BP 137/72 | HR 68 | Temp 97.9°F | Resp 17

## 2024-01-30 DIAGNOSIS — D469 Myelodysplastic syndrome, unspecified: Secondary | ICD-10-CM

## 2024-01-30 DIAGNOSIS — D461 Refractory anemia with ring sideroblasts: Secondary | ICD-10-CM | POA: Diagnosis not present

## 2024-01-30 LAB — CBC WITH DIFFERENTIAL (CANCER CENTER ONLY)
Abs Immature Granulocytes: 1.22 K/uL — ABNORMAL HIGH (ref 0.00–0.07)
Basophils Absolute: 0.4 K/uL — ABNORMAL HIGH (ref 0.0–0.1)
Basophils Relative: 2 %
Eosinophils Absolute: 1 K/uL — ABNORMAL HIGH (ref 0.0–0.5)
Eosinophils Relative: 6 %
HCT: 29.1 % — ABNORMAL LOW (ref 39.0–52.0)
Hemoglobin: 9.2 g/dL — ABNORMAL LOW (ref 13.0–17.0)
Immature Granulocytes: 7 %
Lymphocytes Relative: 13 %
Lymphs Abs: 2.2 K/uL (ref 0.7–4.0)
MCH: 26.4 pg (ref 26.0–34.0)
MCHC: 31.6 g/dL (ref 30.0–36.0)
MCV: 83.6 fL (ref 80.0–100.0)
Monocytes Absolute: 1 K/uL (ref 0.1–1.0)
Monocytes Relative: 6 %
Neutro Abs: 11.3 K/uL — ABNORMAL HIGH (ref 1.7–7.7)
Neutrophils Relative %: 66 %
Platelet Count: 358 K/uL (ref 150–400)
RBC: 3.48 MIL/uL — ABNORMAL LOW (ref 4.22–5.81)
RDW: 34 % — ABNORMAL HIGH (ref 11.5–15.5)
WBC Count: 17.1 K/uL — ABNORMAL HIGH (ref 4.0–10.5)
nRBC: 0.4 % — ABNORMAL HIGH (ref 0.0–0.2)

## 2024-01-30 LAB — CMP (CANCER CENTER ONLY)
ALT: 16 U/L (ref 0–44)
AST: 17 U/L (ref 15–41)
Albumin: 4.4 g/dL (ref 3.5–5.0)
Alkaline Phosphatase: 62 U/L (ref 38–126)
Anion gap: 5 (ref 5–15)
BUN: 24 mg/dL — ABNORMAL HIGH (ref 8–23)
CO2: 27 mmol/L (ref 22–32)
Calcium: 9.7 mg/dL (ref 8.9–10.3)
Chloride: 110 mmol/L (ref 98–111)
Creatinine: 1.45 mg/dL — ABNORMAL HIGH (ref 0.61–1.24)
GFR, Estimated: 49 mL/min — ABNORMAL LOW (ref >60)
Glucose, Bld: 91 mg/dL (ref 70–99)
Potassium: 4.9 mmol/L (ref 3.5–5.1)
Sodium: 142 mmol/L (ref 135–145)
Total Bilirubin: 0.7 mg/dL (ref 0.0–1.2)
Total Protein: 7.2 g/dL (ref 6.5–8.1)

## 2024-01-30 MED ORDER — EPOETIN ALFA-EPBX 40000 UNIT/ML IJ SOLN
40000.0000 [IU] | Freq: Once | INTRAMUSCULAR | Status: AC
  Administered 2024-01-30: 40000 [IU] via SUBCUTANEOUS
  Filled 2024-01-30: qty 1

## 2024-01-30 NOTE — Telephone Encounter (Signed)
 Amber reported pt's hgb =9.2.

## 2024-02-06 ENCOUNTER — Inpatient Hospital Stay

## 2024-02-06 VITALS — BP 127/68 | HR 78 | Temp 97.8°F | Resp 18

## 2024-02-06 DIAGNOSIS — D469 Myelodysplastic syndrome, unspecified: Secondary | ICD-10-CM

## 2024-02-06 DIAGNOSIS — D461 Refractory anemia with ring sideroblasts: Secondary | ICD-10-CM | POA: Diagnosis not present

## 2024-02-06 LAB — CBC WITH DIFFERENTIAL (CANCER CENTER ONLY)
Abs Immature Granulocytes: 0.82 K/uL — ABNORMAL HIGH (ref 0.00–0.07)
Basophils Absolute: 0.5 K/uL — ABNORMAL HIGH (ref 0.0–0.1)
Basophils Relative: 2 %
Eosinophils Absolute: 0.9 K/uL — ABNORMAL HIGH (ref 0.0–0.5)
Eosinophils Relative: 5 %
HCT: 29.9 % — ABNORMAL LOW (ref 39.0–52.0)
Hemoglobin: 9.3 g/dL — ABNORMAL LOW (ref 13.0–17.0)
Immature Granulocytes: 4 %
Lymphocytes Relative: 12 %
Lymphs Abs: 2.2 K/uL (ref 0.7–4.0)
MCH: 26.6 pg (ref 26.0–34.0)
MCHC: 31.1 g/dL (ref 30.0–36.0)
MCV: 85.7 fL (ref 80.0–100.0)
Monocytes Absolute: 1.1 K/uL — ABNORMAL HIGH (ref 0.1–1.0)
Monocytes Relative: 6 %
Neutro Abs: 13.2 K/uL — ABNORMAL HIGH (ref 1.7–7.7)
Neutrophils Relative %: 71 %
Platelet Count: 372 K/uL (ref 150–400)
RBC: 3.49 MIL/uL — ABNORMAL LOW (ref 4.22–5.81)
RDW: 33.3 % — ABNORMAL HIGH (ref 11.5–15.5)
WBC Count: 18.7 K/uL — ABNORMAL HIGH (ref 4.0–10.5)
nRBC: 0.5 % — ABNORMAL HIGH (ref 0.0–0.2)

## 2024-02-06 MED ORDER — EPOETIN ALFA-EPBX 40000 UNIT/ML IJ SOLN
40000.0000 [IU] | Freq: Once | INTRAMUSCULAR | Status: AC
  Administered 2024-02-06: 40000 [IU] via SUBCUTANEOUS
  Filled 2024-02-06: qty 1

## 2024-02-13 ENCOUNTER — Inpatient Hospital Stay (HOSPITAL_BASED_OUTPATIENT_CLINIC_OR_DEPARTMENT_OTHER): Admitting: Internal Medicine

## 2024-02-13 ENCOUNTER — Inpatient Hospital Stay: Attending: Internal Medicine

## 2024-02-13 ENCOUNTER — Encounter: Payer: Self-pay | Admitting: *Deleted

## 2024-02-13 ENCOUNTER — Inpatient Hospital Stay

## 2024-02-13 VITALS — BP 136/71 | HR 79 | Temp 98.0°F | Resp 17 | Ht 68.0 in | Wt 156.0 lb

## 2024-02-13 DIAGNOSIS — Z9049 Acquired absence of other specified parts of digestive tract: Secondary | ICD-10-CM | POA: Insufficient documentation

## 2024-02-13 DIAGNOSIS — D75839 Thrombocytosis, unspecified: Secondary | ICD-10-CM | POA: Diagnosis not present

## 2024-02-13 DIAGNOSIS — R194 Change in bowel habit: Secondary | ICD-10-CM | POA: Diagnosis not present

## 2024-02-13 DIAGNOSIS — D72829 Elevated white blood cell count, unspecified: Secondary | ICD-10-CM | POA: Insufficient documentation

## 2024-02-13 DIAGNOSIS — Z9089 Acquired absence of other organs: Secondary | ICD-10-CM | POA: Diagnosis not present

## 2024-02-13 DIAGNOSIS — D469 Myelodysplastic syndrome, unspecified: Secondary | ICD-10-CM

## 2024-02-13 DIAGNOSIS — R112 Nausea with vomiting, unspecified: Secondary | ICD-10-CM | POA: Insufficient documentation

## 2024-02-13 DIAGNOSIS — Z79899 Other long term (current) drug therapy: Secondary | ICD-10-CM | POA: Diagnosis not present

## 2024-02-13 DIAGNOSIS — R531 Weakness: Secondary | ICD-10-CM | POA: Insufficient documentation

## 2024-02-13 DIAGNOSIS — I129 Hypertensive chronic kidney disease with stage 1 through stage 4 chronic kidney disease, or unspecified chronic kidney disease: Secondary | ICD-10-CM | POA: Insufficient documentation

## 2024-02-13 DIAGNOSIS — R5383 Other fatigue: Secondary | ICD-10-CM | POA: Diagnosis not present

## 2024-02-13 DIAGNOSIS — D461 Refractory anemia with ring sideroblasts: Secondary | ICD-10-CM | POA: Insufficient documentation

## 2024-02-13 DIAGNOSIS — Z86018 Personal history of other benign neoplasm: Secondary | ICD-10-CM | POA: Insufficient documentation

## 2024-02-13 DIAGNOSIS — D473 Essential (hemorrhagic) thrombocythemia: Secondary | ICD-10-CM | POA: Insufficient documentation

## 2024-02-13 DIAGNOSIS — R202 Paresthesia of skin: Secondary | ICD-10-CM | POA: Insufficient documentation

## 2024-02-13 DIAGNOSIS — Z7961 Long term (current) use of immunomodulator: Secondary | ICD-10-CM | POA: Insufficient documentation

## 2024-02-13 DIAGNOSIS — Z9079 Acquired absence of other genital organ(s): Secondary | ICD-10-CM | POA: Insufficient documentation

## 2024-02-13 DIAGNOSIS — Z860100 Personal history of colon polyps, unspecified: Secondary | ICD-10-CM | POA: Insufficient documentation

## 2024-02-13 DIAGNOSIS — N184 Chronic kidney disease, stage 4 (severe): Secondary | ICD-10-CM | POA: Insufficient documentation

## 2024-02-13 DIAGNOSIS — K219 Gastro-esophageal reflux disease without esophagitis: Secondary | ICD-10-CM | POA: Insufficient documentation

## 2024-02-13 DIAGNOSIS — G629 Polyneuropathy, unspecified: Secondary | ICD-10-CM | POA: Insufficient documentation

## 2024-02-13 LAB — CBC WITH DIFFERENTIAL (CANCER CENTER ONLY)
Abs Immature Granulocytes: 0.89 K/uL — ABNORMAL HIGH (ref 0.00–0.07)
Basophils Absolute: 0.5 K/uL — ABNORMAL HIGH (ref 0.0–0.1)
Basophils Relative: 2 %
Eosinophils Absolute: 1.9 K/uL — ABNORMAL HIGH (ref 0.0–0.5)
Eosinophils Relative: 9 %
HCT: 31.4 % — ABNORMAL LOW (ref 39.0–52.0)
Hemoglobin: 10 g/dL — ABNORMAL LOW (ref 13.0–17.0)
Immature Granulocytes: 4 %
Lymphocytes Relative: 11 %
Lymphs Abs: 2.4 K/uL (ref 0.7–4.0)
MCH: 27 pg (ref 26.0–34.0)
MCHC: 31.8 g/dL (ref 30.0–36.0)
MCV: 84.9 fL (ref 80.0–100.0)
Monocytes Absolute: 1.2 K/uL — ABNORMAL HIGH (ref 0.1–1.0)
Monocytes Relative: 5 %
Neutro Abs: 14.8 K/uL — ABNORMAL HIGH (ref 1.7–7.7)
Neutrophils Relative %: 69 %
Platelet Count: 281 K/uL (ref 150–400)
RBC: 3.7 MIL/uL — ABNORMAL LOW (ref 4.22–5.81)
RDW: 33.1 % — ABNORMAL HIGH (ref 11.5–15.5)
WBC Count: 21.6 K/uL — ABNORMAL HIGH (ref 4.0–10.5)
nRBC: 0.3 % — ABNORMAL HIGH (ref 0.0–0.2)

## 2024-02-13 LAB — CMP (CANCER CENTER ONLY)
ALT: 24 U/L (ref 0–44)
AST: 16 U/L (ref 15–41)
Albumin: 4.3 g/dL (ref 3.5–5.0)
Alkaline Phosphatase: 64 U/L (ref 38–126)
Anion gap: 6 (ref 5–15)
BUN: 21 mg/dL (ref 8–23)
CO2: 24 mmol/L (ref 22–32)
Calcium: 8.9 mg/dL (ref 8.9–10.3)
Chloride: 109 mmol/L (ref 98–111)
Creatinine: 1.22 mg/dL (ref 0.61–1.24)
GFR, Estimated: >60 mL/min (ref >60)
Glucose, Bld: 117 mg/dL — ABNORMAL HIGH (ref 70–99)
Potassium: 4.4 mmol/L (ref 3.5–5.1)
Sodium: 139 mmol/L (ref 135–145)
Total Bilirubin: 1 mg/dL (ref 0.0–1.2)
Total Protein: 7.1 g/dL (ref 6.5–8.1)

## 2024-02-13 LAB — FERRITIN: Ferritin: 402 ng/mL — ABNORMAL HIGH (ref 24–336)

## 2024-02-13 LAB — IRON AND IRON BINDING CAPACITY (CC-WL,HP ONLY)
Iron: 75 ug/dL (ref 45–182)
Saturation Ratios: 29 % (ref 17.9–39.5)
TIBC: 263 ug/dL (ref 250–450)
UIBC: 188 ug/dL (ref 117–376)

## 2024-02-13 LAB — LACTATE DEHYDROGENASE: LDH: 418 U/L — ABNORMAL HIGH (ref 98–192)

## 2024-02-13 MED ORDER — EPOETIN ALFA-EPBX 40000 UNIT/ML IJ SOLN
40000.0000 [IU] | Freq: Once | INTRAMUSCULAR | Status: AC
  Administered 2024-02-13: 40000 [IU] via SUBCUTANEOUS
  Filled 2024-02-13: qty 1

## 2024-02-13 NOTE — Progress Notes (Signed)
 [facility]  [phone] Fax:([phone]  OFFICE PROGRESS NOTE  [name], MD [address] 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   PRIOR THERAPY: Previous treatment with combination of Hydrea  and anagrelide .  CURRENT THERAPY:  1) Revlimid 5 mg by mouth daily 2 weeks on and 2 weeks off.  Started 03/25/2016.  Managed by Dr. Jerrye at [facility]. 2) anagrelide  0.5 mg p.o. twice daily. 3) Aranesp  300 mcg subcutaneously every 3 weeks.  This is switched to Procrit  400 mcg subcutaneously every 2 weeks starting April 13, 2017.  The frequency of the Procrit  was changed to weekly schedule when he was in [place] .  He is currently on Retacrit  weekly as a biosimilar to Procrit .  INTERVAL HISTORY: Roy Johnson 78 y.o. male returns to the clinic today for follow-up visit. Discussed the use of AI scribe software for clinical note transcription with the patient, who gave verbal consent to proceed.  History of Present Illness Roy Johnson is a 78 year old male with myelodysplastic syndrome and essential thrombocytopenia who presents for evaluation and repeat blood work.  He has a history of myelodysplastic syndrome, specifically refractory anemia with ring sideroblasts, diagnosed in August 2017. He is currently undergoing treatment with luspatercept  and ritacrit. After receiving a couple of luspatercept  injections, his hemoglobin levels increased. However, he has temporarily stopped the injections due to insurance issues but plans to resume them soon.  He also has a history of essential thrombocytopenia with a positive JAK2 mutation. He is taking anagrelide  0.5 mg twice daily, which has been effective in maintaining his platelet levels. Additionally, he is on Revlimid 5 mg daily  for two weeks out of every four, which has been associated with fluctuations in his white blood cell count.  He experiences sensory issues, including numbness in his fingers and toes, and a diminished sense of smell and taste. He also experienced an unusual episode of vomiting after drinking a glass of water, despite not having any food intake at the time. He experiences nausea and takes Nexium at night, which has reduced the level of nausea but not completely alleviated it. No acid reflux, but looking at food sometimes makes him feel sick.  Socially, he splits his time between [place]  and [place] , living in each state for about six months. He drives to [place]  due to the amount of belongings his wife wants to transport. He mentions that he pays [place]  taxes because he does not stay in [place]  long enough to qualify for their tax benefits.    MEDICAL HISTORY: Past Medical History:  Diagnosis Date   Anemia    Anesthesia of skin    Calculus of gallbladder with acute cholecystitis, with obstruction    Cellulitis of back    Chest pain    Chronic kidney disease (CKD), stage III (moderate) (HCC)    Colon polyp    Cough    Disorientation    Diverticulitis    Diverticulosis    Dyspnea on exertion    Elevated white blood cell count    Fatigue    Forgetfulness    GERD (gastroesophageal reflux disease)    Hearing loss    HTN (hypertension)    Hydrocele    Hyperkalemia    Hyperlipidemia    Hypertensive chronic kidney disease with stage 1 through stage 4 chronic kidney disease,  or unspecified chronic kidney disease    Hypokalemia    IBS (irritable bowel syndrome)    Impacted cerumen, bilateral    Inguinal hernia    Insomnia    Lightheadedness    Liver disease    Lower abdominal pain    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   Mixed stress and urge urinary incontinence    Myelodysplastic syndrome (HCC)    Nausea with vomiting    Numbness and tingling in both hands    Other amnesia     Other benign neoplasm of skin, unspecified    Overflow incontinence of urine    Pain in right leg    Paresthesia of skin    Prostate cancer (HCC)    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)    Right upper quadrant pain    Sinusitis, acute    SOB (shortness of breath)    Thrombocythemia    Umbilical hernia    Vitamin D deficiency    Weakness     ALLERGIES:  has no known allergies.  MEDICATIONS:  Current Outpatient Medications  Medication Sig Dispense Refill   acetaminophen  (TYLENOL ) 325 MG tablet Take 2 tablets (650 mg total) by mouth every 6 (six) hours as needed for mild pain (pain score 1-3), fever or headache.     acetaminophen  (TYLENOL ) 500 MG tablet Take 500-1,000 mg by mouth every 6 (six) hours as needed (for pain).     amLODipine  (NORVASC ) 5 MG tablet Take 5 mg by mouth at bedtime.  2   amoxicillin -clavulanate (AUGMENTIN ) 875-125 MG tablet Take 1 tablet by mouth 2 (two) times daily. 14 tablet 0   anagrelide  (AGRYLIN) 1 MG capsule TAKE 1 CAPSULE(1 MG) BY MOUTH DAILY (Patient taking differently: Take 1 mg by mouth in the morning and at bedtime.) 90 capsule 0   aspirin  EC 81 MG tablet Take 81 mg by mouth 2 (two) times a week. Swallow whole.     cetirizine (ZYRTEC) 10 MG tablet Take 10 mg by mouth daily as needed for allergies.     [Paused] cholestyramine  (QUESTRAN ) 4 g packet Take 1 packet (4 g total) by mouth 2 (two) times daily. (Patient taking differently: Take 4 g by mouth at bedtime.) 180 packet 3   docusate sodium  (COLACE) 100 MG capsule Take 1 capsule (100 mg total) by mouth 2 (two) times daily as needed for mild constipation. 10 capsule 0   epoetin  alfa-epbx (RETACRIT ) 40000 UNIT/ML injection Inject 40,000 Units into the skin See admin instructions. Inject 40,000 units into the skin every 7 days as needed, depending on bloodwork     ferrous sulfate  325 (65 FE) MG EC tablet Take 1 tablet (325 mg total) by mouth daily with breakfast. 30 tablet 0    fluticasone  (FLONASE ) 50 MCG/ACT nasal spray Place 1 spray into both nostrils daily for 7 days. (Patient taking differently: Place 1-2 sprays into both nostrils daily as needed for allergies or rhinitis.) 1 g 0   gabapentin  (NEURONTIN ) 100 MG capsule TAKE 1 CAPSULE BY MOUTH TWICE DAILY AND 2 CAPSULES BY MOUTH EVERY EVENING 360 capsule 1   hyoscyamine  (LEVSIN  SL) 0.125 MG SL tablet Take 1 tablet (0.125 mg total) by mouth every 6 (six) hours as needed. (Patient taking differently: Place 0.125 mg under the tongue every 6 (six) hours as needed for cramping.) 60 tablet 11   ipratropium (ATROVENT) 0.06 % nasal spray Place 1 spray into both nostrils daily as needed for rhinitis.     luspatercept -aamt (REBLOZYL ) 25  MG subcutaneous injection Inject 25 mg into the skin See admin instructions. Inject 25 mg into the skin every 3 weeks     methocarbamol  (ROBAXIN ) 500 MG tablet Take 1 tablet (500 mg total) by mouth every 8 (eight) hours as needed for muscle spasms. 30 tablet 0   Multiple Vitamins-Minerals (MULTIVITAMIN MEN 50+) TABS Take 1 tablet by mouth 2 (two) times a week.     ondansetron  (ZOFRAN ) 4 MG tablet Take 1 tablet (4 mg total) by mouth every 8 (eight) hours as needed for nausea or vomiting. 20 tablet 0   oxyCODONE  (OXY IR/ROXICODONE ) 5 MG immediate release tablet Take 1 tablet (5 mg total) by mouth every 6 (six) hours as needed for breakthrough pain. 15 tablet 0   No current facility-administered medications for this visit.   Facility-Administered Medications Ordered in Other Visits  Medication Dose Route Frequency Provider Last Rate Last Admin   epoetin  alfa-epbx (RETACRIT ) injection 40,000 Units  40,000 Units Subcutaneous Once Sherrod Sherrod, MD        SURGICAL HISTORY:  Past Surgical History:  Procedure Laterality Date   CHOLECYSTECTOMY N/A 12/02/2023   Procedure: LAPAROSCOPIC CHOLECYSTECTOMY;  Surgeon: Kinsinger, Herlene Righter, MD;  Location: [facility] OR;  Service: General;  Laterality: N/A;    COLONOSCOPY     POLYPECTOMY     PROSTATECTOMY     TONSILLECTOMY      REVIEW OF SYSTEMS:  Constitutional: positive for fatigue Eyes: negative Ears, nose, mouth, throat, and face: negative Respiratory: negative Cardiovascular: negative Gastrointestinal: positive for change in bowel habits and nausea Genitourinary:negative Integument/breast: negative Hematologic/lymphatic: negative Musculoskeletal:negative Neurological: positive for paresthesia and weakness Behavioral/Psych: negative Endocrine: negative Allergic/Immunologic: negative   PHYSICAL EXAMINATION: General appearance: alert, cooperative, appears stated age, fatigued, and no distress Head: Normocephalic, without obvious abnormality, atraumatic Neck: no adenopathy, no JVD, supple, symmetrical, trachea midline, and thyroid not enlarged, symmetric, no tenderness/mass/nodules Lymph nodes: Cervical, supraclavicular, and axillary nodes normal. Resp: clear to auscultation bilaterally Back: symmetric, no curvature. ROM normal. No CVA tenderness. Cardio: regular rate and rhythm, S1, S2 normal, no murmur, click, rub or gallop GI: soft, non-tender; bowel sounds normal; no masses,  no organomegaly Extremities: extremities normal, atraumatic, no cyanosis or edema Neurologic: Alert and oriented X 3, normal strength and tone. Normal symmetric reflexes. Normal coordination and gait  ECOG PERFORMANCE STATUS: 1 - Symptomatic but completely ambulatory  Blood pressure 136/71, pulse 79, temperature 98 F (36.7 C), temperature source Temporal, resp. rate 17, height 5' 8 (1.727 m), weight 156 lb (70.8 kg), SpO2 99%.  LABORATORY DATA:  Lab Results  Component Value Date   WBC 21.6 (H) 02/13/2024   HGB 10.0 (L) 02/13/2024   HCT 31.4 (L) 02/13/2024   MCV 84.9 02/13/2024   PLT 281 02/13/2024      Chemistry      Component Value Date/Time   NA 139 02/13/2024 1001   NA 141 12/08/2016 0928   K 4.4 02/13/2024 1001   K 4.5 12/08/2016 0928    CL 109 02/13/2024 1001   CO2 24 02/13/2024 1001   CO2 27 12/08/2016 0928   BUN 21 02/13/2024 1001   BUN 14.1 12/08/2016 0928   CREATININE 1.22 02/13/2024 1001   CREATININE 0.9 12/08/2016 0928      Component Value Date/Time   CALCIUM  8.9 02/13/2024 1001   CALCIUM  9.0 12/08/2016 0928   ALKPHOS 64 02/13/2024 1001   ALKPHOS 87 12/08/2016 0928   AST 16 02/13/2024 1001   AST 25 12/08/2016 0928   ALT  24 02/13/2024 1001   ALT 41 12/08/2016 0928   BILITOT 1.0 02/13/2024 1001   BILITOT 0.84 12/08/2016 0928      RADIOGRAPHIC STUDIES:  ASSESSMENT AND PLAN:  This is a very pleasant 78 years old white male with essential thrombocythemia with positive JAK-2 mutation as well as myelodysplastic syndrome. He is currently on treatment with Revlimid 5 mg by mouth daily according to the recommendation from [facility].  He is also on treatment with Aranesp  every 3 weeks.  Aranesp  was a change it to Procrit  initially every 2 weeks and currently on weekly basis.  He mentioned that he is feeling much better on Procrit  weekly  The patient has been on treatment with Retacrit  40,000 unit weekly for more than 3 years.  He has been tolerating this treatment well. He is also on treatment with Luspatercept  (Reblozyl ) subcutaneous injection every 3 weeks Assessment and Plan Assessment & Plan Myelodysplastic syndrome with refractory anemia and ring sideroblasts Diagnosed in August 2017. Hemoglobin improved to 10 g/dL from 0.7-0.6 g/dL after rituxan injection. Insurance issues have temporarily halted lopatacet injections, but resolution is anticipated. Hemoglobin threshold for erythropoietin  injection is 11 g/dL, potentially 12 g/dL in [place] . - Continue rituxan injections as insurance issues are resolved. - Will coordinate with insurance for site-specific approval for local administration of lopatacet.  Essential thrombocythemia with JAK2 mutation Managed with anagrelide  0.5 mg twice daily, maintaining  platelet levels. White blood cell count decreased to 21.6 x 10^9/L from previous highs of 30-40 x 10^9/L. Revlimid 5 mg daily for two weeks out of four is being adjusted to manage white blood cell count. - Continue anagrelide  0.5 mg twice daily. - Continue Revlimid 5 mg daily for two weeks out of four.  Peripheral neuropathy Symptoms include numbness in fingers and toes, likely related to underlying condition and medication side effects.  Chronic nausea Experiences nausea and occasional vomiting of water, not related to acid reflux. Nexium taken at night reduces nausea but does not eliminate it. Nausea may be related to underlying condition or medication side effects. - Continue Nexium at night to manage nausea. He was advised to call immediately if he has any other concerning symptoms in the interval.  All questions were answered. The patient knows to call the clinic with any problems, questions or concerns. We can certainly see the patient much sooner if necessary.   Disclaimer: This note was dictated with voice recognition software. Similar sounding words can inadvertently be transcribed and may not be corrected upon review.

## 2024-02-13 NOTE — Progress Notes (Unsigned)
 Per lab pt. Hemoglobin today 10.0 today. Flush room notified.

## 2024-02-20 ENCOUNTER — Encounter: Payer: Self-pay | Admitting: *Deleted

## 2024-02-20 ENCOUNTER — Inpatient Hospital Stay

## 2024-02-20 VITALS — BP 156/72 | HR 72 | Temp 97.8°F | Resp 17

## 2024-02-20 DIAGNOSIS — D469 Myelodysplastic syndrome, unspecified: Secondary | ICD-10-CM

## 2024-02-20 DIAGNOSIS — D461 Refractory anemia with ring sideroblasts: Secondary | ICD-10-CM | POA: Diagnosis not present

## 2024-02-20 LAB — CMP (CANCER CENTER ONLY)
ALT: 25 U/L (ref 0–44)
AST: 14 U/L — ABNORMAL LOW (ref 15–41)
Albumin: 4.6 g/dL (ref 3.5–5.0)
Alkaline Phosphatase: 64 U/L (ref 38–126)
Anion gap: 5 (ref 5–15)
BUN: 27 mg/dL — ABNORMAL HIGH (ref 8–23)
CO2: 27 mmol/L (ref 22–32)
Calcium: 9.7 mg/dL (ref 8.9–10.3)
Chloride: 108 mmol/L (ref 98–111)
Creatinine: 1.19 mg/dL (ref 0.61–1.24)
GFR, Estimated: >60 mL/min (ref >60)
Glucose, Bld: 84 mg/dL (ref 70–99)
Potassium: 5.5 mmol/L — ABNORMAL HIGH (ref 3.5–5.1)
Sodium: 140 mmol/L (ref 135–145)
Total Bilirubin: 1 mg/dL (ref 0.0–1.2)
Total Protein: 7.5 g/dL (ref 6.5–8.1)

## 2024-02-20 LAB — CBC WITH DIFFERENTIAL (CANCER CENTER ONLY)
Abs Immature Granulocytes: 0.32 K/uL — ABNORMAL HIGH (ref 0.00–0.07)
Basophils Absolute: 0.5 K/uL — ABNORMAL HIGH (ref 0.0–0.1)
Basophils Relative: 3 %
Eosinophils Absolute: 1.8 K/uL — ABNORMAL HIGH (ref 0.0–0.5)
Eosinophils Relative: 10 %
HCT: 34.1 % — ABNORMAL LOW (ref 39.0–52.0)
Hemoglobin: 10.5 g/dL — ABNORMAL LOW (ref 13.0–17.0)
Immature Granulocytes: 2 %
Lymphocytes Relative: 15 %
Lymphs Abs: 2.7 K/uL (ref 0.7–4.0)
MCH: 26.6 pg (ref 26.0–34.0)
MCHC: 30.8 g/dL (ref 30.0–36.0)
MCV: 86.5 fL (ref 80.0–100.0)
Monocytes Absolute: 1 K/uL (ref 0.1–1.0)
Monocytes Relative: 6 %
Neutro Abs: 11 K/uL — ABNORMAL HIGH (ref 1.7–7.7)
Neutrophils Relative %: 64 %
Platelet Count: 289 K/uL (ref 150–400)
RBC: 3.94 MIL/uL — ABNORMAL LOW (ref 4.22–5.81)
RDW: 33 % — ABNORMAL HIGH (ref 11.5–15.5)
WBC Count: 17.2 K/uL — ABNORMAL HIGH (ref 4.0–10.5)
nRBC: 0.2 % (ref 0.0–0.2)

## 2024-02-20 MED ORDER — EPOETIN ALFA-EPBX 40000 UNIT/ML IJ SOLN
40000.0000 [IU] | Freq: Once | INTRAMUSCULAR | Status: AC
  Administered 2024-02-20: 40000 [IU] via SUBCUTANEOUS
  Filled 2024-02-20: qty 1

## 2024-02-27 ENCOUNTER — Inpatient Hospital Stay

## 2024-02-27 VITALS — BP 131/95 | HR 73 | Temp 98.1°F | Resp 17

## 2024-02-27 DIAGNOSIS — D461 Refractory anemia with ring sideroblasts: Secondary | ICD-10-CM | POA: Diagnosis not present

## 2024-02-27 DIAGNOSIS — D469 Myelodysplastic syndrome, unspecified: Secondary | ICD-10-CM

## 2024-02-27 LAB — CBC WITH DIFFERENTIAL (CANCER CENTER ONLY)
Abs Immature Granulocytes: 0.73 K/uL — ABNORMAL HIGH (ref 0.00–0.07)
Basophils Absolute: 0.6 K/uL — ABNORMAL HIGH (ref 0.0–0.1)
Basophils Relative: 3 %
Eosinophils Absolute: 1.2 K/uL — ABNORMAL HIGH (ref 0.0–0.5)
Eosinophils Relative: 7 %
HCT: 34.6 % — ABNORMAL LOW (ref 39.0–52.0)
Hemoglobin: 10.8 g/dL — ABNORMAL LOW (ref 13.0–17.0)
Immature Granulocytes: 4 %
Lymphocytes Relative: 13 %
Lymphs Abs: 2.4 K/uL (ref 0.7–4.0)
MCH: 26.6 pg (ref 26.0–34.0)
MCHC: 31.2 g/dL (ref 30.0–36.0)
MCV: 85.2 fL (ref 80.0–100.0)
Monocytes Absolute: 1.4 K/uL — ABNORMAL HIGH (ref 0.1–1.0)
Monocytes Relative: 7 %
Neutro Abs: 12.1 K/uL — ABNORMAL HIGH (ref 1.7–7.7)
Neutrophils Relative %: 66 %
Platelet Count: 291 K/uL (ref 150–400)
RBC: 4.06 MIL/uL — ABNORMAL LOW (ref 4.22–5.81)
RDW: 33 % — ABNORMAL HIGH (ref 11.5–15.5)
WBC Count: 18.4 K/uL — ABNORMAL HIGH (ref 4.0–10.5)
nRBC: 0.7 % — ABNORMAL HIGH (ref 0.0–0.2)

## 2024-02-27 MED ORDER — EPOETIN ALFA-EPBX 40000 UNIT/ML IJ SOLN
40000.0000 [IU] | Freq: Once | INTRAMUSCULAR | Status: AC
  Administered 2024-02-27: 40000 [IU] via SUBCUTANEOUS
  Filled 2024-02-27: qty 1

## 2024-03-05 ENCOUNTER — Inpatient Hospital Stay

## 2024-03-05 ENCOUNTER — Telehealth: Payer: Self-pay

## 2024-03-05 DIAGNOSIS — D461 Refractory anemia with ring sideroblasts: Secondary | ICD-10-CM | POA: Diagnosis not present

## 2024-03-05 DIAGNOSIS — D469 Myelodysplastic syndrome, unspecified: Secondary | ICD-10-CM

## 2024-03-05 LAB — CBC WITH DIFFERENTIAL (CANCER CENTER ONLY)
Abs Immature Granulocytes: 1.76 K/uL — ABNORMAL HIGH (ref 0.00–0.07)
Basophils Absolute: 0.4 K/uL — ABNORMAL HIGH (ref 0.0–0.1)
Basophils Relative: 2 %
Eosinophils Absolute: 2.3 K/uL — ABNORMAL HIGH (ref 0.0–0.5)
Eosinophils Relative: 10 %
HCT: 38.4 % — ABNORMAL LOW (ref 39.0–52.0)
Hemoglobin: 12 g/dL — ABNORMAL LOW (ref 13.0–17.0)
Immature Granulocytes: 7 %
Lymphocytes Relative: 12 %
Lymphs Abs: 3 K/uL (ref 0.7–4.0)
MCH: 25.8 pg — ABNORMAL LOW (ref 26.0–34.0)
MCHC: 31.3 g/dL (ref 30.0–36.0)
MCV: 82.6 fL (ref 80.0–100.0)
Monocytes Absolute: 0.6 K/uL (ref 0.1–1.0)
Monocytes Relative: 2 %
Neutro Abs: 16.4 K/uL — ABNORMAL HIGH (ref 1.7–7.7)
Neutrophils Relative %: 67 %
Platelet Count: 282 K/uL (ref 150–400)
RBC: 4.65 MIL/uL (ref 4.22–5.81)
RDW: 34.1 % — ABNORMAL HIGH (ref 11.5–15.5)
WBC Count: 24.4 K/uL — ABNORMAL HIGH (ref 4.0–10.5)
nRBC: 0.2 % (ref 0.0–0.2)

## 2024-03-05 LAB — CMP (CANCER CENTER ONLY)
ALT: 15 U/L (ref 0–44)
AST: 18 U/L (ref 15–41)
Albumin: 4.8 g/dL (ref 3.5–5.0)
Alkaline Phosphatase: 76 U/L (ref 38–126)
Anion gap: 10 (ref 5–15)
BUN: 27 mg/dL — ABNORMAL HIGH (ref 8–23)
CO2: 25 mmol/L (ref 22–32)
Calcium: 9.6 mg/dL (ref 8.9–10.3)
Chloride: 104 mmol/L (ref 98–111)
Creatinine: 1.29 mg/dL — ABNORMAL HIGH (ref 0.61–1.24)
GFR, Estimated: 57 mL/min — ABNORMAL LOW (ref >60)
Glucose, Bld: 95 mg/dL (ref 70–99)
Potassium: 5.2 mmol/L — ABNORMAL HIGH (ref 3.5–5.1)
Sodium: 139 mmol/L (ref 135–145)
Total Bilirubin: 0.9 mg/dL (ref 0.0–1.2)
Total Protein: 7.8 g/dL (ref 6.5–8.1)

## 2024-03-05 NOTE — Telephone Encounter (Signed)
 Per Dr. Sherrod - okay to hold retacrit  injection with hemoglobin of 12.

## 2024-03-05 NOTE — Progress Notes (Signed)
 Pts HGB 12.0 per Dr Sherrod hold retacrit . Pt was informed about HGB results.

## 2024-03-12 ENCOUNTER — Inpatient Hospital Stay

## 2024-03-12 DIAGNOSIS — D461 Refractory anemia with ring sideroblasts: Secondary | ICD-10-CM | POA: Diagnosis present

## 2024-03-12 DIAGNOSIS — Z79899 Other long term (current) drug therapy: Secondary | ICD-10-CM | POA: Insufficient documentation

## 2024-03-12 DIAGNOSIS — D473 Essential (hemorrhagic) thrombocythemia: Secondary | ICD-10-CM | POA: Insufficient documentation

## 2024-03-12 DIAGNOSIS — D72829 Elevated white blood cell count, unspecified: Secondary | ICD-10-CM | POA: Diagnosis not present

## 2024-03-12 DIAGNOSIS — D469 Myelodysplastic syndrome, unspecified: Secondary | ICD-10-CM

## 2024-03-12 LAB — CBC WITH DIFFERENTIAL (CANCER CENTER ONLY)
Abs Immature Granulocytes: 0.15 K/uL — ABNORMAL HIGH (ref 0.00–0.07)
Basophils Absolute: 0.3 K/uL — ABNORMAL HIGH (ref 0.0–0.1)
Basophils Relative: 2 %
Eosinophils Absolute: 1.4 K/uL — ABNORMAL HIGH (ref 0.0–0.5)
Eosinophils Relative: 9 %
HCT: 37.3 % — ABNORMAL LOW (ref 39.0–52.0)
Hemoglobin: 11.5 g/dL — ABNORMAL LOW (ref 13.0–17.0)
Immature Granulocytes: 1 %
Lymphocytes Relative: 15 %
Lymphs Abs: 2.3 K/uL (ref 0.7–4.0)
MCH: 25.6 pg — ABNORMAL LOW (ref 26.0–34.0)
MCHC: 30.8 g/dL (ref 30.0–36.0)
MCV: 82.9 fL (ref 80.0–100.0)
Monocytes Absolute: 1.5 K/uL — ABNORMAL HIGH (ref 0.1–1.0)
Monocytes Relative: 10 %
Neutro Abs: 9.6 K/uL — ABNORMAL HIGH (ref 1.7–7.7)
Neutrophils Relative %: 63 %
Platelet Count: 225 K/uL (ref 150–400)
RBC: 4.5 MIL/uL (ref 4.22–5.81)
RDW: 33.1 % — ABNORMAL HIGH (ref 11.5–15.5)
WBC Count: 15.2 K/uL — ABNORMAL HIGH (ref 4.0–10.5)
nRBC: 0 % (ref 0.0–0.2)

## 2024-03-12 NOTE — Progress Notes (Signed)
 Pts HGB is 11.5 per parameter hold retacrit . Pt was informed about HGB results.

## 2024-03-19 ENCOUNTER — Inpatient Hospital Stay

## 2024-03-19 VITALS — BP 142/76 | HR 72

## 2024-03-19 DIAGNOSIS — D461 Refractory anemia with ring sideroblasts: Secondary | ICD-10-CM | POA: Diagnosis not present

## 2024-03-19 DIAGNOSIS — D469 Myelodysplastic syndrome, unspecified: Secondary | ICD-10-CM

## 2024-03-19 LAB — CBC WITH DIFFERENTIAL (CANCER CENTER ONLY)
Abs Immature Granulocytes: 0.21 K/uL — ABNORMAL HIGH (ref 0.00–0.07)
Basophils Absolute: 0.4 K/uL — ABNORMAL HIGH (ref 0.0–0.1)
Basophils Relative: 3 %
Eosinophils Absolute: 1 K/uL — ABNORMAL HIGH (ref 0.0–0.5)
Eosinophils Relative: 6 %
HCT: 34 % — ABNORMAL LOW (ref 39.0–52.0)
Hemoglobin: 10.8 g/dL — ABNORMAL LOW (ref 13.0–17.0)
Immature Granulocytes: 1 %
Lymphocytes Relative: 14 %
Lymphs Abs: 2.2 K/uL (ref 0.7–4.0)
MCH: 26.2 pg (ref 26.0–34.0)
MCHC: 31.8 g/dL (ref 30.0–36.0)
MCV: 82.3 fL (ref 80.0–100.0)
Monocytes Absolute: 1.1 K/uL — ABNORMAL HIGH (ref 0.1–1.0)
Monocytes Relative: 7 %
Neutro Abs: 10.8 K/uL — ABNORMAL HIGH (ref 1.7–7.7)
Neutrophils Relative %: 69 %
Platelet Count: 371 K/uL (ref 150–400)
RBC: 4.13 MIL/uL — ABNORMAL LOW (ref 4.22–5.81)
RDW: 31.9 % — ABNORMAL HIGH (ref 11.5–15.5)
WBC Count: 15.6 K/uL — ABNORMAL HIGH (ref 4.0–10.5)
nRBC: 0 % (ref 0.0–0.2)

## 2024-03-19 LAB — CMP (CANCER CENTER ONLY)
ALT: 13 U/L (ref 0–44)
AST: 17 U/L (ref 15–41)
Albumin: 4.6 g/dL (ref 3.5–5.0)
Alkaline Phosphatase: 82 U/L (ref 38–126)
Anion gap: 9 (ref 5–15)
BUN: 29 mg/dL — ABNORMAL HIGH (ref 8–23)
CO2: 24 mmol/L (ref 22–32)
Calcium: 9.4 mg/dL (ref 8.9–10.3)
Chloride: 108 mmol/L (ref 98–111)
Creatinine: 1.2 mg/dL (ref 0.61–1.24)
GFR, Estimated: >60 mL/min (ref >60)
Glucose, Bld: 96 mg/dL (ref 70–99)
Potassium: 4.5 mmol/L (ref 3.5–5.1)
Sodium: 142 mmol/L (ref 135–145)
Total Bilirubin: 0.5 mg/dL (ref 0.0–1.2)
Total Protein: 7.6 g/dL (ref 6.5–8.1)

## 2024-03-19 MED ORDER — EPOETIN ALFA-EPBX 40000 UNIT/ML IJ SOLN
40000.0000 [IU] | Freq: Once | INTRAMUSCULAR | Status: AC
  Administered 2024-03-19: 40000 [IU] via SUBCUTANEOUS
  Filled 2024-03-19: qty 1

## 2024-03-19 NOTE — Progress Notes (Signed)
 Received message from lab, patients hgb 10.8.  Okay to proceed with Epoetin  injection.

## 2024-03-26 ENCOUNTER — Inpatient Hospital Stay

## 2024-03-26 VITALS — BP 138/75 | HR 74 | Resp 18

## 2024-03-26 DIAGNOSIS — D469 Myelodysplastic syndrome, unspecified: Secondary | ICD-10-CM

## 2024-03-26 DIAGNOSIS — D461 Refractory anemia with ring sideroblasts: Secondary | ICD-10-CM | POA: Diagnosis not present

## 2024-03-26 LAB — CBC WITH DIFFERENTIAL (CANCER CENTER ONLY)
Abs Immature Granulocytes: 4.27 K/uL — ABNORMAL HIGH (ref 0.00–0.07)
Basophils Absolute: 0.5 K/uL — ABNORMAL HIGH (ref 0.0–0.1)
Basophils Relative: 2 %
Eosinophils Absolute: 1.5 K/uL — ABNORMAL HIGH (ref 0.0–0.5)
Eosinophils Relative: 5 %
HCT: 32.7 % — ABNORMAL LOW (ref 39.0–52.0)
Hemoglobin: 10.4 g/dL — ABNORMAL LOW (ref 13.0–17.0)
Immature Granulocytes: 14 %
Lymphocytes Relative: 9 %
Lymphs Abs: 2.8 K/uL (ref 0.7–4.0)
MCH: 25.9 pg — ABNORMAL LOW (ref 26.0–34.0)
MCHC: 31.8 g/dL (ref 30.0–36.0)
MCV: 81.5 fL (ref 80.0–100.0)
Monocytes Absolute: 1.1 K/uL — ABNORMAL HIGH (ref 0.1–1.0)
Monocytes Relative: 4 %
Neutro Abs: 21.2 K/uL — ABNORMAL HIGH (ref 1.7–7.7)
Neutrophils Relative %: 66 %
Platelet Count: 485 K/uL — ABNORMAL HIGH (ref 150–400)
RBC: 4.01 MIL/uL — ABNORMAL LOW (ref 4.22–5.81)
RDW: 32.4 % — ABNORMAL HIGH (ref 11.5–15.5)
WBC Count: 31.3 K/uL — ABNORMAL HIGH (ref 4.0–10.5)
nRBC: 0.6 % — ABNORMAL HIGH (ref 0.0–0.2)

## 2024-03-26 MED ORDER — EPOETIN ALFA-EPBX 40000 UNIT/ML IJ SOLN
40000.0000 [IU] | Freq: Once | INTRAMUSCULAR | Status: AC
  Administered 2024-03-26: 10:00:00 40000 [IU] via SUBCUTANEOUS
  Filled 2024-03-26: qty 1

## 2024-04-02 ENCOUNTER — Inpatient Hospital Stay

## 2024-04-02 DIAGNOSIS — D469 Myelodysplastic syndrome, unspecified: Secondary | ICD-10-CM

## 2024-04-02 DIAGNOSIS — D461 Refractory anemia with ring sideroblasts: Secondary | ICD-10-CM | POA: Diagnosis not present

## 2024-04-02 LAB — CBC WITH DIFFERENTIAL (CANCER CENTER ONLY)
Abs Immature Granulocytes: 1.57 K/uL — ABNORMAL HIGH (ref 0.00–0.07)
Basophils Absolute: 0.4 K/uL — ABNORMAL HIGH (ref 0.0–0.1)
Basophils Relative: 2 %
Eosinophils Absolute: 0.8 K/uL — ABNORMAL HIGH (ref 0.0–0.5)
Eosinophils Relative: 3 %
HCT: 35.5 % — ABNORMAL LOW (ref 39.0–52.0)
Hemoglobin: 11 g/dL — ABNORMAL LOW (ref 13.0–17.0)
Immature Granulocytes: 7 %
Lymphocytes Relative: 11 %
Lymphs Abs: 2.6 K/uL (ref 0.7–4.0)
MCH: 25.1 pg — ABNORMAL LOW (ref 26.0–34.0)
MCHC: 31 g/dL (ref 30.0–36.0)
MCV: 81.1 fL (ref 80.0–100.0)
Monocytes Absolute: 1.4 K/uL — ABNORMAL HIGH (ref 0.1–1.0)
Monocytes Relative: 6 %
Neutro Abs: 16.9 K/uL — ABNORMAL HIGH (ref 1.7–7.7)
Neutrophils Relative %: 71 %
Platelet Count: 276 K/uL (ref 150–400)
RBC: 4.38 MIL/uL (ref 4.22–5.81)
RDW: 32.5 % — ABNORMAL HIGH (ref 11.5–15.5)
WBC Count: 23.5 K/uL — ABNORMAL HIGH (ref 4.0–10.5)
nRBC: 0.2 % (ref 0.0–0.2)

## 2024-04-02 LAB — CMP (CANCER CENTER ONLY)
ALT: 13 U/L (ref 0–44)
AST: 14 U/L — ABNORMAL LOW (ref 15–41)
Albumin: 4.8 g/dL (ref 3.5–5.0)
Alkaline Phosphatase: 116 U/L (ref 38–126)
Anion gap: 9 (ref 5–15)
BUN: 28 mg/dL — ABNORMAL HIGH (ref 8–23)
CO2: 25 mmol/L (ref 22–32)
Calcium: 9.5 mg/dL (ref 8.9–10.3)
Chloride: 108 mmol/L (ref 98–111)
Creatinine: 1.2 mg/dL (ref 0.61–1.24)
GFR, Estimated: >60 mL/min
Glucose, Bld: 107 mg/dL — ABNORMAL HIGH (ref 70–99)
Potassium: 4.9 mmol/L (ref 3.5–5.1)
Sodium: 142 mmol/L (ref 135–145)
Total Bilirubin: 0.7 mg/dL (ref 0.0–1.2)
Total Protein: 7.7 g/dL (ref 6.5–8.1)

## 2024-04-02 NOTE — Progress Notes (Signed)
 Hgb 11.0 today. Retacrit  held per parameters. Patient made aware, verbalized understanding.

## 2024-04-09 ENCOUNTER — Inpatient Hospital Stay

## 2024-04-09 DIAGNOSIS — D461 Refractory anemia with ring sideroblasts: Secondary | ICD-10-CM | POA: Diagnosis not present

## 2024-04-09 DIAGNOSIS — D469 Myelodysplastic syndrome, unspecified: Secondary | ICD-10-CM

## 2024-04-09 LAB — CBC WITH DIFFERENTIAL (CANCER CENTER ONLY)
Abs Immature Granulocytes: 0.95 K/uL — ABNORMAL HIGH (ref 0.00–0.07)
Basophils Absolute: 0.3 K/uL — ABNORMAL HIGH (ref 0.0–0.1)
Basophils Relative: 1 %
Eosinophils Absolute: 1.5 K/uL — ABNORMAL HIGH (ref 0.0–0.5)
Eosinophils Relative: 6 %
HCT: 35.6 % — ABNORMAL LOW (ref 39.0–52.0)
Hemoglobin: 11.3 g/dL — ABNORMAL LOW (ref 13.0–17.0)
Immature Granulocytes: 4 %
Lymphocytes Relative: 11 %
Lymphs Abs: 2.6 K/uL (ref 0.7–4.0)
MCH: 25.8 pg — ABNORMAL LOW (ref 26.0–34.0)
MCHC: 31.7 g/dL (ref 30.0–36.0)
MCV: 81.3 fL (ref 80.0–100.0)
Monocytes Absolute: 0.8 K/uL (ref 0.1–1.0)
Monocytes Relative: 4 %
Neutro Abs: 17.5 K/uL — ABNORMAL HIGH (ref 1.7–7.7)
Neutrophils Relative %: 74 %
Platelet Count: 211 K/uL (ref 150–400)
RBC: 4.38 MIL/uL (ref 4.22–5.81)
RDW: 32.6 % — ABNORMAL HIGH (ref 11.5–15.5)
WBC Count: 23.6 K/uL — ABNORMAL HIGH (ref 4.0–10.5)
nRBC: 0.1 % (ref 0.0–0.2)

## 2024-04-09 NOTE — Progress Notes (Signed)
 Hgb 11.3 today. Injection held per parameters. Patient made aware, verbalized understanding.

## 2024-04-16 ENCOUNTER — Telehealth: Payer: Self-pay | Admitting: Medical Oncology

## 2024-04-16 ENCOUNTER — Inpatient Hospital Stay

## 2024-04-16 ENCOUNTER — Inpatient Hospital Stay: Attending: Internal Medicine

## 2024-04-16 VITALS — BP 148/81 | HR 73 | Temp 97.9°F | Resp 15

## 2024-04-16 DIAGNOSIS — D471 Chronic myeloproliferative disease: Secondary | ICD-10-CM | POA: Diagnosis not present

## 2024-04-16 DIAGNOSIS — D469 Myelodysplastic syndrome, unspecified: Secondary | ICD-10-CM

## 2024-04-16 DIAGNOSIS — Z7961 Long term (current) use of immunomodulator: Secondary | ICD-10-CM | POA: Insufficient documentation

## 2024-04-16 DIAGNOSIS — R7989 Other specified abnormal findings of blood chemistry: Secondary | ICD-10-CM | POA: Insufficient documentation

## 2024-04-16 DIAGNOSIS — D72829 Elevated white blood cell count, unspecified: Secondary | ICD-10-CM | POA: Insufficient documentation

## 2024-04-16 DIAGNOSIS — D473 Essential (hemorrhagic) thrombocythemia: Secondary | ICD-10-CM | POA: Diagnosis not present

## 2024-04-16 DIAGNOSIS — D461 Refractory anemia with ring sideroblasts: Secondary | ICD-10-CM | POA: Insufficient documentation

## 2024-04-16 DIAGNOSIS — R5383 Other fatigue: Secondary | ICD-10-CM | POA: Insufficient documentation

## 2024-04-16 DIAGNOSIS — Z9049 Acquired absence of other specified parts of digestive tract: Secondary | ICD-10-CM | POA: Insufficient documentation

## 2024-04-16 DIAGNOSIS — Z79899 Other long term (current) drug therapy: Secondary | ICD-10-CM | POA: Diagnosis not present

## 2024-04-16 DIAGNOSIS — R1310 Dysphagia, unspecified: Secondary | ICD-10-CM | POA: Diagnosis not present

## 2024-04-16 DIAGNOSIS — R630 Anorexia: Secondary | ICD-10-CM | POA: Insufficient documentation

## 2024-04-16 DIAGNOSIS — Z9079 Acquired absence of other genital organ(s): Secondary | ICD-10-CM | POA: Diagnosis not present

## 2024-04-16 DIAGNOSIS — R110 Nausea: Secondary | ICD-10-CM | POA: Diagnosis not present

## 2024-04-16 DIAGNOSIS — Z9089 Acquired absence of other organs: Secondary | ICD-10-CM | POA: Diagnosis not present

## 2024-04-16 DIAGNOSIS — Z86018 Personal history of other benign neoplasm: Secondary | ICD-10-CM | POA: Diagnosis not present

## 2024-04-16 DIAGNOSIS — D75839 Thrombocytosis, unspecified: Secondary | ICD-10-CM | POA: Insufficient documentation

## 2024-04-16 DIAGNOSIS — Z860100 Personal history of colon polyps, unspecified: Secondary | ICD-10-CM | POA: Diagnosis not present

## 2024-04-16 LAB — CBC WITH DIFFERENTIAL (CANCER CENTER ONLY)
Abs Immature Granulocytes: 2.94 K/uL — ABNORMAL HIGH (ref 0.00–0.07)
Basophils Absolute: 0.4 K/uL — ABNORMAL HIGH (ref 0.0–0.1)
Basophils Relative: 1 %
Eosinophils Absolute: 1.1 K/uL — ABNORMAL HIGH (ref 0.0–0.5)
Eosinophils Relative: 3 %
HCT: 33.9 % — ABNORMAL LOW (ref 39.0–52.0)
Hemoglobin: 10.6 g/dL — ABNORMAL LOW (ref 13.0–17.0)
Immature Granulocytes: 8 %
Lymphocytes Relative: 9 %
Lymphs Abs: 3.1 K/uL (ref 0.7–4.0)
MCH: 25 pg — ABNORMAL LOW (ref 26.0–34.0)
MCHC: 31.3 g/dL (ref 30.0–36.0)
MCV: 80 fL (ref 80.0–100.0)
Monocytes Absolute: 1 K/uL (ref 0.1–1.0)
Monocytes Relative: 3 %
Neutro Abs: 26.4 K/uL — ABNORMAL HIGH (ref 1.7–7.7)
Neutrophils Relative %: 76 %
Platelet Count: 327 K/uL (ref 150–400)
RBC: 4.24 MIL/uL (ref 4.22–5.81)
RDW: 32.3 % — ABNORMAL HIGH (ref 11.5–15.5)
WBC Count: 34.8 K/uL — ABNORMAL HIGH (ref 4.0–10.5)
nRBC: 0.1 % (ref 0.0–0.2)

## 2024-04-16 LAB — IRON AND IRON BINDING CAPACITY (CC-WL,HP ONLY)
Iron: 132 ug/dL (ref 45–182)
Saturation Ratios: 51 % — ABNORMAL HIGH (ref 17.9–39.5)
TIBC: 260 ug/dL (ref 250–450)
UIBC: 128 ug/dL

## 2024-04-16 LAB — CMP (CANCER CENTER ONLY)
ALT: 9 U/L (ref 0–44)
AST: 22 U/L (ref 15–41)
Albumin: 4.7 g/dL (ref 3.5–5.0)
Alkaline Phosphatase: 155 U/L — ABNORMAL HIGH (ref 38–126)
Anion gap: 9 (ref 5–15)
BUN: 29 mg/dL — ABNORMAL HIGH (ref 8–23)
CO2: 24 mmol/L (ref 22–32)
Calcium: 9.3 mg/dL (ref 8.9–10.3)
Chloride: 105 mmol/L (ref 98–111)
Creatinine: 1.27 mg/dL — ABNORMAL HIGH (ref 0.61–1.24)
GFR, Estimated: 58 mL/min — ABNORMAL LOW
Glucose, Bld: 94 mg/dL (ref 70–99)
Potassium: 4.6 mmol/L (ref 3.5–5.1)
Sodium: 138 mmol/L (ref 135–145)
Total Bilirubin: 0.5 mg/dL (ref 0.0–1.2)
Total Protein: 7.7 g/dL (ref 6.5–8.1)

## 2024-04-16 LAB — FERRITIN: Ferritin: 1113 ng/mL — ABNORMAL HIGH (ref 24–336)

## 2024-04-16 LAB — LACTATE DEHYDROGENASE: LDH: 404 U/L — ABNORMAL HIGH (ref 105–235)

## 2024-04-16 MED ORDER — EPOETIN ALFA-EPBX 40000 UNIT/ML IJ SOLN
40000.0000 [IU] | Freq: Once | INTRAMUSCULAR | Status: AC
  Administered 2024-04-16: 40000 [IU] via SUBCUTANEOUS
  Filled 2024-04-16: qty 1

## 2024-04-16 NOTE — Telephone Encounter (Signed)
 [employer], Lab , reported that pts HGB today is 10.6 . Message sent to Flush room.

## 2024-04-17 ENCOUNTER — Inpatient Hospital Stay: Admitting: Internal Medicine

## 2024-04-17 VITALS — BP 132/72 | HR 87 | Temp 97.9°F | Resp 17 | Ht 68.0 in | Wt 149.0 lb

## 2024-04-17 DIAGNOSIS — D461 Refractory anemia with ring sideroblasts: Secondary | ICD-10-CM | POA: Diagnosis not present

## 2024-04-17 DIAGNOSIS — D469 Myelodysplastic syndrome, unspecified: Secondary | ICD-10-CM | POA: Diagnosis not present

## 2024-04-17 NOTE — Progress Notes (Signed)
 "    [facility]  [phone] [phone]  OFFICE PROGRESS NOTE  [name], MD [address] 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   PRIOR THERAPY: Previous treatment with combination of Hydrea  and anagrelide .  CURRENT THERAPY:  1) Revlimid 5 mg by mouth daily 2 weeks on and 2 weeks off.  Started 03/25/2016.  Managed by Dr. Jerrye at [facility]. 2) anagrelide  0.5 mg p.o. twice daily. 3) Aranesp  300 mcg subcutaneously every 3 weeks.  This is switched to Procrit  400 mcg subcutaneously every 2 weeks starting April 13, 2017.  The frequency of the Procrit  was changed to weekly schedule when he was in [place] .  He is currently on Retacrit  weekly as a biosimilar to Procrit .  INTERVAL HISTORY: Roy Johnson 79 y.o. male returns to the clinic today for follow-up visit. Discussed the use of AI scribe software for clinical note transcription with the patient, who gave verbal consent to proceed.  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  He remains on active treatment for myelodysplastic syndrome and essential thrombocythemia with Retacrit , luspatercept , intermittent low-dose Revlimid, and anagrelide  for platelet control. He has also been receiving tarlatamab, which he feels has improved his hemoglobin. He is coordinating ongoing injections and blood tests, particularly in anticipation of travel to [place] , and is working with providers to ensure continuity of care across states.  He describes significant fatigue, with episodes of  profound exhaustion such as collapsing and sleeping for several hours after returning home from appointments. His energy levels are variable, with some days of reasonable energy and others marked by pronounced fatigue. He continues to experience poor appetite and dysphagia, stating difficulty swallowing, which sometimes prevents him from eating until late in the evening. He takes Nexium daily for nausea. Previous gastrointestinal evaluation for dysphagia was unrevealing. He is not currently taking iron supplements, as his ferritin was elevated and supplementation was discontinued.  His white blood cell count has been persistently elevated for the past six months, attributed to his underlying myeloproliferative disorder. He notes that the leukocytosis persisted even after cholecystectomy. He is not currently taking hydroxyurea . He has found a stable dosing regimen for anagrelide  to manage his platelet count effectively.     MEDICAL HISTORY: Past Medical History:  Diagnosis Date   Anemia    Anesthesia of skin    Calculus of gallbladder with acute cholecystitis, with obstruction    Cellulitis of back    Chest pain    Chronic kidney disease (CKD), stage III (moderate) (HCC)    Colon polyp    Cough    Disorientation    Diverticulitis    Diverticulosis    Dyspnea on exertion    Elevated white blood cell count    Fatigue    Forgetfulness    GERD (gastroesophageal reflux disease)    Hearing loss    HTN (hypertension)    Hydrocele    Hyperkalemia    Hyperlipidemia    Hypertensive chronic kidney disease with stage 1 through stage 4 chronic kidney disease, or unspecified chronic kidney disease    Hypokalemia    IBS (irritable bowel syndrome)    Impacted cerumen, bilateral  Inguinal hernia    Insomnia    Lightheadedness    Liver disease    Lower abdominal pain    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  MEDICATIONS:  Current Outpatient Medications  Medication Sig Dispense Refill   ruxolitinib phosphate (JAKAFI) 5 MG tablet Take 5 mg by mouth.     acetaminophen  (TYLENOL ) 325 MG tablet Take 2 tablets (650 mg total) by mouth every 6 (six) hours as needed for mild pain (pain score 1-3), fever or headache.     acetaminophen  (TYLENOL ) 500 MG tablet Take 500-1,000 mg by mouth every 6 (six) hours as needed (for pain).     amLODipine  (NORVASC ) 5 MG tablet Take 5 mg by mouth at bedtime.  2   amoxicillin -clavulanate (AUGMENTIN ) 875-125 MG tablet Take 1 tablet by mouth 2 (two) times daily. 14 tablet 0   anagrelide  (AGRYLIN) 1 MG capsule TAKE 1 CAPSULE(1 MG) BY MOUTH DAILY (Patient taking differently: Take 1 mg by mouth in the morning and at bedtime.) 90 capsule 0   aspirin  EC 81 MG tablet Take 81 mg by mouth 2 (two) times a week. Swallow whole.     cetirizine (ZYRTEC) 10 MG tablet Take 10 mg by mouth daily as needed for allergies.     [Paused] cholestyramine  (QUESTRAN ) 4 g packet Take 1 packet (4 g total) by mouth 2 (two) times daily. (Patient taking differently: Take 4 g by mouth at bedtime.) 180 packet 3   docusate sodium  (COLACE) 100 MG capsule Take 1 capsule (100 mg total) by mouth 2 (two) times daily as needed for mild constipation. 10 capsule 0   epoetin  alfa-epbx (RETACRIT ) 40000 UNIT/ML injection Inject 40,000 Units into the skin See admin instructions. Inject 40,000 units into the skin every 7 days as needed, depending on  bloodwork     ferrous sulfate  325 (65 FE) MG EC tablet Take 1 tablet (325 mg total) by mouth daily with breakfast. 30 tablet 0   fluticasone  (FLONASE ) 50 MCG/ACT nasal spray Place 1 spray into both nostrils daily for 7 days. (Patient taking differently: Place 1-2 sprays into both nostrils daily as needed for allergies or rhinitis.) 1 g 0   gabapentin  (NEURONTIN ) 100 MG capsule TAKE 1 CAPSULE BY MOUTH TWICE DAILY AND 2 CAPSULES BY MOUTH EVERY EVENING 360 capsule 1   hyoscyamine  (LEVSIN  SL) 0.125 MG SL tablet Take 1 tablet (0.125 mg total) by mouth every 6 (six) hours as needed. (Patient taking differently: Place 0.125 mg under the tongue every 6 (six) hours as needed for cramping.) 60 tablet 11   ipratropium (ATROVENT) 0.06 % nasal spray Place 1 spray into both nostrils daily as needed for rhinitis.     luspatercept -aamt (REBLOZYL ) 25 MG subcutaneous injection Inject 25 mg into the skin See  admin instructions. Inject 25 mg into the skin every 3 weeks     methocarbamol  (ROBAXIN ) 500 MG tablet Take 1 tablet (500 mg total) by mouth every 8 (eight) hours as needed for muscle spasms. 30 tablet 0   Multiple Vitamins-Minerals (MULTIVITAMIN MEN 50+) TABS Take 1 tablet by mouth 2 (two) times a week.     ondansetron  (ZOFRAN ) 4 MG tablet Take 1 tablet (4 mg total) by mouth every 8 (eight) hours as needed for nausea or vomiting. 20 tablet 0   oxyCODONE  (OXY IR/ROXICODONE ) 5 MG immediate release tablet Take 1 tablet (5 mg total) by mouth every 6 (six) hours as needed for breakthrough pain. 15 tablet 0   No current facility-administered medications for this visit.    SURGICAL HISTORY:  Past Surgical History:  Procedure Laterality Date   CHOLECYSTECTOMY N/A 12/02/2023   Procedure: LAPAROSCOPIC CHOLECYSTECTOMY;  Surgeon: Kinsinger, Herlene Righter, MD;  Location: [facility] OR;  Service: General;  Laterality: N/A;   COLONOSCOPY     POLYPECTOMY     PROSTATECTOMY     TONSILLECTOMY      REVIEW OF SYSTEMS:  Constitutional:  positive for fatigue Eyes: negative Ears, nose, mouth, throat, and face: negative Respiratory: negative Cardiovascular: negative Gastrointestinal: positive for change in bowel habits Genitourinary:negative Integument/breast: negative Hematologic/lymphatic: negative Musculoskeletal:negative Neurological: negative Behavioral/Psych: negative Endocrine: negative Allergic/Immunologic: negative   PHYSICAL EXAMINATION: General appearance: alert, cooperative, appears stated age, fatigued, and no distress Head: Normocephalic, without obvious abnormality, atraumatic Neck: no adenopathy, no JVD, supple, symmetrical, trachea midline, and thyroid not enlarged, symmetric, no tenderness/mass/nodules Lymph nodes: Cervical, supraclavicular, and axillary nodes normal. Resp: clear to auscultation bilaterally Back: symmetric, no curvature. ROM normal. No CVA tenderness. Cardio: regular rate and rhythm, S1, S2 normal, no murmur, click, rub or gallop GI: soft, non-tender; bowel sounds normal; no masses,  no organomegaly Extremities: extremities normal, atraumatic, no cyanosis or edema Neurologic: Alert and oriented X 3, normal strength and tone. Normal symmetric reflexes. Normal coordination and gait  ECOG PERFORMANCE STATUS: 1 - Symptomatic but completely ambulatory  Blood pressure 132/72, pulse 87, temperature 97.9 F (36.6 C), temperature source Temporal, resp. rate 17, height 5' 8 (1.727 m), weight 149 lb (67.6 kg), SpO2 98%.  LABORATORY DATA:  Lab Results  Component Value Date   WBC 34.8 (H) 04/16/2024   HGB 10.6 (L) 04/16/2024   HCT 33.9 (L) 04/16/2024   MCV 80.0 04/16/2024   PLT 327 04/16/2024      Chemistry      Component Value Date/Time   NA 138 04/16/2024 0909   NA 141 12/08/2016 0928   K 4.6 04/16/2024 0909   K 4.5 12/08/2016 0928   CL 105 04/16/2024 0909   CO2 24 04/16/2024 0909   CO2 27 12/08/2016 0928   BUN 29 (H) 04/16/2024 0909   BUN 14.1 12/08/2016 0928   CREATININE  1.27 (H) 04/16/2024 0909   CREATININE 0.9 12/08/2016 0928      Component Value Date/Time   CALCIUM  9.3 04/16/2024 0909   CALCIUM  9.0 12/08/2016 0928   ALKPHOS 155 (H) 04/16/2024 0909   ALKPHOS 87 12/08/2016 0928   AST 22 04/16/2024 0909   AST 25 12/08/2016 0928   ALT 9 04/16/2024 0909   ALT 41 12/08/2016 0928   BILITOT 0.5 04/16/2024 0909   BILITOT 0.84 12/08/2016 0928      RADIOGRAPHIC STUDIES:  ASSESSMENT AND PLAN:  This is a very pleasant 79 years old white male with essential thrombocythemia with positive JAK-2 mutation as  well as myelodysplastic syndrome. He is currently on treatment with Revlimid 5 mg by mouth daily according to the recommendation from [facility].  He is also on treatment with Aranesp  every 3 weeks.  Aranesp  was a change it to Procrit  initially every 2 weeks and currently on weekly basis.  He mentioned that he is feeling much better on Procrit  weekly  The patient has been on treatment with Retacrit  40,000 unit weekly for more than 3 years.  He has been tolerating this treatment well. He is also on treatment with Luspatercept  (Reblozyl ) subcutaneous injection every 3 weeks The patient continues to tolerate this treatment fairly well. Assessment and Plan Assessment & Plan Myelodysplastic syndrome with ring sideroblasts and thrombocytosis Myelodysplastic syndrome with ring sideroblasts and thrombocytosis, refractory to standard therapy since 2017, requiring ongoing supportive care. He experiences increasing fatigue and physical limitations but remains engaged in care. Coordination of therapy is necessary due to upcoming travel. - Extended Retacrit  and luspatercept  therapy with laboratory monitoring until further notice. - Adjusted Revlimid dosing to 5 mg a few days per week to balance efficacy and side effects. - Coordinated blood tests and injections through mid-February to accommodate travel.  Essential thrombocythemia with JAK2 mutation JAK2-positive  essential thrombocythemia with platelet counts well controlled on anagrelide . Not currently on hydroxyurea . Jakafi initiation planned to address disease manifestations and improve quality of life, with potential benefits for splenomegaly, appetite, and overall well-being, but with risk of cytopenias. - Planned initiation of Jakafi at 5 mg daily, with titration as tolerated, contingent on maintaining adequate hemoglobin. - Continued anagrelide  at 5 mg twice daily for platelet control.  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 Elevated ferritin suggests inflammation. Iron supplementation discontinued. - Discontinued iron supplementation due to elevated ferritin and lack of indication. - Planned Jakafi initiation, which may address leukocytosis as part of overall disease management.  Appetite loss and dysphagia related to hematologic disease Chronic appetite loss and dysphagia, likely multifactorial and related to hematologic disease and possible splenomegaly. Symptoms impact nutritional status and quality of life. Previous GI evaluation unrevealing. Uses Nexium for nausea. Steroids discussed as appetite stimulant but declined due to concerns about renal function and glycemic control. Jakafi may improve symptoms if splenomegaly is reduced. - Discussed short-term Medrol Dose Pack as appetite stimulant, but deferred per preference to avoid steroids due to renal and glycemic concerns. - Planned to monitor for improvement in appetite and well-being with Jakafi initiation, as this may improve symptoms if splenomegaly is reduced. - Advised to continue Nexium for nausea as needed. The patient was advised to call immediately if he has any concerning symptoms in the interval.  All questions were answered. The patient knows to call the clinic with any problems, questions or concerns. We  can certainly see the patient much sooner if necessary.   Disclaimer: This note was dictated with voice recognition software. Similar sounding words can inadvertently be transcribed and may not be corrected upon review.    "

## 2024-04-23 ENCOUNTER — Telehealth: Payer: Self-pay | Admitting: Medical Oncology

## 2024-04-23 ENCOUNTER — Inpatient Hospital Stay

## 2024-04-23 ENCOUNTER — Other Ambulatory Visit: Payer: Self-pay | Admitting: Internal Medicine

## 2024-04-23 VITALS — BP 139/84 | HR 83 | Temp 98.0°F | Resp 16

## 2024-04-23 DIAGNOSIS — D469 Myelodysplastic syndrome, unspecified: Secondary | ICD-10-CM

## 2024-04-23 DIAGNOSIS — D72829 Elevated white blood cell count, unspecified: Secondary | ICD-10-CM

## 2024-04-23 DIAGNOSIS — D461 Refractory anemia with ring sideroblasts: Secondary | ICD-10-CM | POA: Diagnosis not present

## 2024-04-23 LAB — CMP (CANCER CENTER ONLY)
ALT: 20 U/L (ref 0–44)
AST: 19 U/L (ref 15–41)
Albumin: 4.6 g/dL (ref 3.5–5.0)
Alkaline Phosphatase: 115 U/L (ref 38–126)
Anion gap: 12 (ref 5–15)
BUN: 29 mg/dL — ABNORMAL HIGH (ref 8–23)
CO2: 24 mmol/L (ref 22–32)
Calcium: 9.4 mg/dL (ref 8.9–10.3)
Chloride: 104 mmol/L (ref 98–111)
Creatinine: 1.41 mg/dL — ABNORMAL HIGH (ref 0.61–1.24)
GFR, Estimated: 51 mL/min — ABNORMAL LOW
Glucose, Bld: 100 mg/dL — ABNORMAL HIGH (ref 70–99)
Potassium: 4.9 mmol/L (ref 3.5–5.1)
Sodium: 140 mmol/L (ref 135–145)
Total Bilirubin: 0.6 mg/dL (ref 0.0–1.2)
Total Protein: 7.9 g/dL (ref 6.5–8.1)

## 2024-04-23 LAB — CBC WITH DIFFERENTIAL (CANCER CENTER ONLY)
Abs Immature Granulocytes: 7.12 K/uL — ABNORMAL HIGH (ref 0.00–0.07)
Basophils Absolute: 0.5 K/uL — ABNORMAL HIGH (ref 0.0–0.1)
Basophils Relative: 1 %
Eosinophils Absolute: 1.4 K/uL — ABNORMAL HIGH (ref 0.0–0.5)
Eosinophils Relative: 3 %
HCT: 31.1 % — ABNORMAL LOW (ref 39.0–52.0)
Hemoglobin: 10 g/dL — ABNORMAL LOW (ref 13.0–17.0)
Immature Granulocytes: 13 %
Lymphocytes Relative: 6 %
Lymphs Abs: 3.3 K/uL (ref 0.7–4.0)
MCH: 25.3 pg — ABNORMAL LOW (ref 26.0–34.0)
MCHC: 32.2 g/dL (ref 30.0–36.0)
MCV: 78.7 fL — ABNORMAL LOW (ref 80.0–100.0)
Monocytes Absolute: 1 K/uL (ref 0.1–1.0)
Monocytes Relative: 2 %
Neutro Abs: 40 K/uL — ABNORMAL HIGH (ref 1.7–7.7)
Neutrophils Relative %: 75 %
Platelet Count: 341 K/uL (ref 150–400)
RBC: 3.95 MIL/uL — ABNORMAL LOW (ref 4.22–5.81)
RDW: 32.7 % — ABNORMAL HIGH (ref 11.5–15.5)
WBC Count: 53.4 K/uL (ref 4.0–10.5)
nRBC: 0.2 % (ref 0.0–0.2)

## 2024-04-23 MED ORDER — EPOETIN ALFA-EPBX 40000 UNIT/ML IJ SOLN
40000.0000 [IU] | Freq: Once | INTRAMUSCULAR | Status: AC
  Administered 2024-04-23: 40000 [IU] via SUBCUTANEOUS
  Filled 2024-04-23: qty 1

## 2024-04-23 NOTE — Progress Notes (Signed)
 Patients Hgb is 10.0 per Lab/RN Diane Bell injection was released and given.

## 2024-04-23 NOTE — Telephone Encounter (Signed)
 CRITICAL VALUE STICKER  CRITICAL VALUE: WBC=53.4k, hgb 10.0  RECEIVER (on-site recipient of call):Balen Woolum, RN  DATE & TIME NOTIFIED: 04/23/2024 @ 0945  MESSENGER (representative from lab):Melissa  MD NOTIFIED: Sherrod, MD  TIME OF NOTIFICATION:1005  RESPONSE:  Mohamed considering Jakafi medication.

## 2024-04-29 ENCOUNTER — Other Ambulatory Visit (HOSPITAL_COMMUNITY): Payer: Self-pay | Admitting: Internal Medicine

## 2024-04-29 DIAGNOSIS — R1310 Dysphagia, unspecified: Secondary | ICD-10-CM

## 2024-04-29 LAB — BCR-ABL1 FISH
Cells Analyzed: 200
Cells Counted: 200

## 2024-04-30 ENCOUNTER — Other Ambulatory Visit: Payer: Self-pay | Admitting: Internal Medicine

## 2024-04-30 ENCOUNTER — Inpatient Hospital Stay

## 2024-04-30 ENCOUNTER — Encounter: Payer: Self-pay | Admitting: Internal Medicine

## 2024-04-30 ENCOUNTER — Telehealth: Payer: Self-pay | Admitting: Medical Oncology

## 2024-04-30 VITALS — BP 154/81 | HR 80 | Temp 97.6°F | Resp 17

## 2024-04-30 DIAGNOSIS — J3489 Other specified disorders of nose and nasal sinuses: Secondary | ICD-10-CM

## 2024-04-30 DIAGNOSIS — D461 Refractory anemia with ring sideroblasts: Secondary | ICD-10-CM | POA: Diagnosis not present

## 2024-04-30 DIAGNOSIS — D469 Myelodysplastic syndrome, unspecified: Secondary | ICD-10-CM

## 2024-04-30 LAB — CBC WITH DIFFERENTIAL (CANCER CENTER ONLY)
Abs Immature Granulocytes: 3.1 K/uL — ABNORMAL HIGH (ref 0.00–0.07)
Band Neutrophils: 12 %
Basophils Absolute: 0.3 K/uL — ABNORMAL HIGH (ref 0.0–0.1)
Basophils Relative: 1 %
Blasts: 2 %
Eosinophils Absolute: 0.3 K/uL (ref 0.0–0.5)
Eosinophils Relative: 1 %
HCT: 31.6 % — ABNORMAL LOW (ref 39.0–52.0)
Hemoglobin: 10 g/dL — ABNORMAL LOW (ref 13.0–17.0)
Lymphocytes Relative: 6 %
Lymphs Abs: 2 K/uL (ref 0.7–4.0)
MCH: 25.4 pg — ABNORMAL LOW (ref 26.0–34.0)
MCHC: 31.6 g/dL (ref 30.0–36.0)
MCV: 80.2 fL (ref 80.0–100.0)
Metamyelocytes Relative: 4 %
Monocytes Absolute: 1 K/uL (ref 0.1–1.0)
Monocytes Relative: 3 %
Myelocytes: 5 %
Neutro Abs: 26.5 K/uL — ABNORMAL HIGH (ref 1.7–7.7)
Neutrophils Relative %: 66 %
Platelet Count: 309 K/uL (ref 150–400)
RBC: 3.94 MIL/uL — ABNORMAL LOW (ref 4.22–5.81)
RDW: 33.1 % — ABNORMAL HIGH (ref 11.5–15.5)
WBC Count: 34 K/uL — ABNORMAL HIGH (ref 4.0–10.5)
nRBC: 0.1 % (ref 0.0–0.2)

## 2024-04-30 LAB — PATHOLOGIST SMEAR REVIEW

## 2024-04-30 MED ORDER — EPOETIN ALFA-EPBX 40000 UNIT/ML IJ SOLN
40000.0000 [IU] | Freq: Once | INTRAMUSCULAR | Status: AC
  Administered 2024-04-30: 40000 [IU] via SUBCUTANEOUS
  Filled 2024-04-30: qty 1

## 2024-04-30 NOTE — Telephone Encounter (Signed)
 Patient reports that his WBC count is decreasing and requested that Dr. Sherrod be informed.   Patient inquired about results of special testing, including BCR-ABL and FISH. Patient was informed that the BCR-ABL result was negative; FISH results are not yet confirmed.   Patient does not currently have a follow-up appointment scheduled. Patient expressed anxiety regarding pending results, stating he is planning to travel to [place]  on Thursday or Friday and would like clarification prior to departure.

## 2024-05-07 ENCOUNTER — Inpatient Hospital Stay

## 2024-05-14 ENCOUNTER — Inpatient Hospital Stay

## 2024-05-21 ENCOUNTER — Inpatient Hospital Stay

## 2024-05-28 ENCOUNTER — Inpatient Hospital Stay
